# Patient Record
Sex: Female | Born: 1965 | Race: White | Hispanic: No | Smoking: Never smoker
Health system: Southern US, Community
[De-identification: ages and names within clinical notes are randomized; demographics above are authoritative.]

## PROBLEM LIST (undated history)

## (undated) DIAGNOSIS — Z8719 Personal history of other diseases of the digestive system: Secondary | ICD-10-CM

## (undated) DIAGNOSIS — M069 Rheumatoid arthritis, unspecified: Secondary | ICD-10-CM

## (undated) DIAGNOSIS — E119 Type 2 diabetes mellitus without complications: Secondary | ICD-10-CM

## (undated) DIAGNOSIS — I10 Essential (primary) hypertension: Secondary | ICD-10-CM

## (undated) DIAGNOSIS — G43909 Migraine, unspecified, not intractable, without status migrainosus: Secondary | ICD-10-CM

## (undated) DIAGNOSIS — G629 Polyneuropathy, unspecified: Secondary | ICD-10-CM

## (undated) DIAGNOSIS — Z973 Presence of spectacles and contact lenses: Secondary | ICD-10-CM

## (undated) DIAGNOSIS — E039 Hypothyroidism, unspecified: Secondary | ICD-10-CM

## (undated) DIAGNOSIS — K219 Gastro-esophageal reflux disease without esophagitis: Secondary | ICD-10-CM

## (undated) DIAGNOSIS — F419 Anxiety disorder, unspecified: Secondary | ICD-10-CM

## (undated) DIAGNOSIS — G8929 Other chronic pain: Secondary | ICD-10-CM

## (undated) DIAGNOSIS — M79672 Pain in left foot: Secondary | ICD-10-CM

## (undated) DIAGNOSIS — M1990 Unspecified osteoarthritis, unspecified site: Secondary | ICD-10-CM

## (undated) DIAGNOSIS — E785 Hyperlipidemia, unspecified: Secondary | ICD-10-CM

## (undated) DIAGNOSIS — N393 Stress incontinence (female) (male): Secondary | ICD-10-CM

## (undated) DIAGNOSIS — Z794 Long term (current) use of insulin: Secondary | ICD-10-CM

## (undated) HISTORY — DX: Anxiety disorder, unspecified: F41.9

## (undated) HISTORY — DX: Unspecified osteoarthritis, unspecified site: M19.90

## (undated) HISTORY — PX: BUNIONECTOMY: SHX129

## (undated) HISTORY — PX: FOOT SURGERY: SHX648

## (undated) HISTORY — PX: TONSILLECTOMY: SUR1361

## (undated) HISTORY — PX: TRANSOBTURATOR SLING: SHX2571

## (undated) HISTORY — DX: Hyperlipidemia, unspecified: E78.5

## (undated) HISTORY — PX: CARPAL TUNNEL RELEASE: SHX101

## (undated) HISTORY — PX: ANAL SPHINCTEROTOMY: SHX1140

---

## 1988-11-14 HISTORY — PX: ABDOMINAL HYSTERECTOMY: SHX81

## 1999-02-09 ENCOUNTER — Ambulatory Visit (HOSPITAL_COMMUNITY): Admission: RE | Admit: 1999-02-09 | Discharge: 1999-02-09 | Payer: Self-pay | Admitting: Family Medicine

## 1999-09-19 ENCOUNTER — Emergency Department (HOSPITAL_COMMUNITY): Admission: EM | Admit: 1999-09-19 | Discharge: 1999-09-19 | Payer: Self-pay | Admitting: Emergency Medicine

## 1999-09-19 ENCOUNTER — Encounter: Payer: Self-pay | Admitting: Emergency Medicine

## 1999-09-27 ENCOUNTER — Encounter: Admission: RE | Admit: 1999-09-27 | Discharge: 1999-10-22 | Payer: Self-pay | Admitting: Sports Medicine

## 1999-12-12 ENCOUNTER — Ambulatory Visit (HOSPITAL_COMMUNITY): Admission: RE | Admit: 1999-12-12 | Discharge: 1999-12-12 | Payer: Self-pay | Admitting: Family Medicine

## 1999-12-12 ENCOUNTER — Encounter: Payer: Self-pay | Admitting: Family Medicine

## 2000-01-02 ENCOUNTER — Ambulatory Visit (HOSPITAL_COMMUNITY): Admission: RE | Admit: 2000-01-02 | Discharge: 2000-01-02 | Payer: Self-pay | Admitting: Unknown Physician Specialty

## 2000-01-02 ENCOUNTER — Encounter: Payer: Self-pay | Admitting: Family Medicine

## 2000-05-24 ENCOUNTER — Ambulatory Visit (HOSPITAL_COMMUNITY): Admission: RE | Admit: 2000-05-24 | Discharge: 2000-05-24 | Payer: Self-pay | Admitting: Obstetrics and Gynecology

## 2000-05-24 ENCOUNTER — Encounter: Payer: Self-pay | Admitting: Obstetrics and Gynecology

## 2000-07-07 ENCOUNTER — Ambulatory Visit (HOSPITAL_COMMUNITY): Admission: RE | Admit: 2000-07-07 | Discharge: 2000-07-07 | Payer: Self-pay | Admitting: Gastroenterology

## 2000-07-07 ENCOUNTER — Encounter (INDEPENDENT_AMBULATORY_CARE_PROVIDER_SITE_OTHER): Payer: Self-pay | Admitting: Specialist

## 2000-10-14 ENCOUNTER — Encounter: Payer: Self-pay | Admitting: Family Medicine

## 2000-10-14 ENCOUNTER — Ambulatory Visit (HOSPITAL_COMMUNITY): Admission: RE | Admit: 2000-10-14 | Discharge: 2000-10-14 | Payer: Self-pay | Admitting: Family Medicine

## 2000-11-03 ENCOUNTER — Encounter: Payer: Self-pay | Admitting: Gastroenterology

## 2000-11-03 ENCOUNTER — Ambulatory Visit (HOSPITAL_COMMUNITY): Admission: RE | Admit: 2000-11-03 | Discharge: 2000-11-03 | Payer: Self-pay | Admitting: Gastroenterology

## 2000-11-15 ENCOUNTER — Ambulatory Visit (HOSPITAL_COMMUNITY): Admission: RE | Admit: 2000-11-15 | Discharge: 2000-11-15 | Payer: Self-pay | Admitting: Family Medicine

## 2000-11-15 ENCOUNTER — Encounter: Payer: Self-pay | Admitting: Family Medicine

## 2001-02-27 ENCOUNTER — Ambulatory Visit (HOSPITAL_COMMUNITY): Admission: RE | Admit: 2001-02-27 | Discharge: 2001-02-27 | Payer: Self-pay | Admitting: Obstetrics and Gynecology

## 2001-02-27 ENCOUNTER — Encounter: Payer: Self-pay | Admitting: Obstetrics and Gynecology

## 2001-03-08 ENCOUNTER — Encounter: Admission: RE | Admit: 2001-03-08 | Discharge: 2001-04-19 | Payer: Self-pay | Admitting: Family Medicine

## 2001-07-31 ENCOUNTER — Encounter: Payer: Self-pay | Admitting: Obstetrics and Gynecology

## 2001-07-31 ENCOUNTER — Encounter: Admission: RE | Admit: 2001-07-31 | Discharge: 2001-07-31 | Payer: Self-pay | Admitting: Obstetrics and Gynecology

## 2003-05-20 ENCOUNTER — Encounter: Payer: Self-pay | Admitting: *Deleted

## 2003-05-20 ENCOUNTER — Ambulatory Visit (HOSPITAL_COMMUNITY): Admission: RE | Admit: 2003-05-20 | Discharge: 2003-05-20 | Payer: Self-pay | Admitting: *Deleted

## 2003-07-25 ENCOUNTER — Ambulatory Visit (HOSPITAL_COMMUNITY): Admission: RE | Admit: 2003-07-25 | Discharge: 2003-07-25 | Payer: Self-pay | Admitting: Gastroenterology

## 2004-01-08 ENCOUNTER — Ambulatory Visit (HOSPITAL_BASED_OUTPATIENT_CLINIC_OR_DEPARTMENT_OTHER): Admission: RE | Admit: 2004-01-08 | Discharge: 2004-01-08 | Payer: Self-pay | Admitting: Urology

## 2004-09-17 ENCOUNTER — Encounter: Admission: RE | Admit: 2004-09-17 | Discharge: 2004-09-17 | Payer: Self-pay | Admitting: Obstetrics and Gynecology

## 2005-03-14 ENCOUNTER — Ambulatory Visit (HOSPITAL_COMMUNITY): Admission: RE | Admit: 2005-03-14 | Discharge: 2005-03-14 | Payer: Self-pay | Admitting: Family Medicine

## 2005-10-10 ENCOUNTER — Encounter: Admission: RE | Admit: 2005-10-10 | Discharge: 2005-10-25 | Payer: Self-pay | Admitting: Family Medicine

## 2005-11-28 ENCOUNTER — Encounter: Admission: RE | Admit: 2005-11-28 | Discharge: 2006-02-26 | Payer: Self-pay | Admitting: Family Medicine

## 2006-01-05 ENCOUNTER — Ambulatory Visit (HOSPITAL_BASED_OUTPATIENT_CLINIC_OR_DEPARTMENT_OTHER): Admission: RE | Admit: 2006-01-05 | Discharge: 2006-01-05 | Payer: Self-pay | Admitting: General Surgery

## 2006-08-30 ENCOUNTER — Encounter (INDEPENDENT_AMBULATORY_CARE_PROVIDER_SITE_OTHER): Payer: Self-pay | Admitting: *Deleted

## 2006-08-30 ENCOUNTER — Other Ambulatory Visit: Admission: RE | Admit: 2006-08-30 | Discharge: 2006-08-30 | Payer: Self-pay | Admitting: Dental General Practice

## 2006-11-24 ENCOUNTER — Ambulatory Visit (HOSPITAL_COMMUNITY): Admission: RE | Admit: 2006-11-24 | Discharge: 2006-11-24 | Payer: Self-pay | Admitting: Family Medicine

## 2007-10-17 ENCOUNTER — Ambulatory Visit (HOSPITAL_COMMUNITY): Admission: RE | Admit: 2007-10-17 | Discharge: 2007-10-17 | Payer: Self-pay | Admitting: Obstetrics and Gynecology

## 2008-04-22 ENCOUNTER — Encounter: Admission: RE | Admit: 2008-04-22 | Discharge: 2008-04-22 | Payer: Self-pay

## 2008-08-18 ENCOUNTER — Encounter: Admission: RE | Admit: 2008-08-18 | Discharge: 2008-11-16 | Payer: Self-pay | Admitting: Family Medicine

## 2008-09-15 ENCOUNTER — Ambulatory Visit (HOSPITAL_COMMUNITY): Admission: RE | Admit: 2008-09-15 | Discharge: 2008-09-15 | Payer: Self-pay | Admitting: Gastroenterology

## 2008-10-24 ENCOUNTER — Ambulatory Visit (HOSPITAL_COMMUNITY): Admission: RE | Admit: 2008-10-24 | Discharge: 2008-10-24 | Payer: Self-pay | Admitting: Obstetrics and Gynecology

## 2009-02-24 ENCOUNTER — Ambulatory Visit: Payer: Self-pay | Admitting: Family Medicine

## 2009-04-30 ENCOUNTER — Ambulatory Visit (HOSPITAL_COMMUNITY): Admission: RE | Admit: 2009-04-30 | Discharge: 2009-04-30 | Payer: Self-pay | Admitting: Podiatry

## 2009-06-24 ENCOUNTER — Encounter: Admission: RE | Admit: 2009-06-24 | Discharge: 2009-08-18 | Payer: Self-pay | Admitting: Podiatry

## 2009-09-25 ENCOUNTER — Ambulatory Visit (HOSPITAL_BASED_OUTPATIENT_CLINIC_OR_DEPARTMENT_OTHER): Admission: RE | Admit: 2009-09-25 | Discharge: 2009-09-25 | Payer: Self-pay | Admitting: Podiatry

## 2010-01-11 ENCOUNTER — Ambulatory Visit (HOSPITAL_COMMUNITY): Admission: RE | Admit: 2010-01-11 | Discharge: 2010-01-11 | Payer: Self-pay | Admitting: Family Medicine

## 2010-02-01 ENCOUNTER — Encounter: Admission: RE | Admit: 2010-02-01 | Discharge: 2010-05-02 | Payer: Self-pay | Admitting: Family Medicine

## 2010-05-10 ENCOUNTER — Ambulatory Visit (HOSPITAL_COMMUNITY): Admission: RE | Admit: 2010-05-10 | Discharge: 2010-05-10 | Payer: Self-pay | Admitting: Sports Medicine

## 2010-07-27 ENCOUNTER — Ambulatory Visit: Payer: Self-pay | Admitting: Family Medicine

## 2010-08-06 ENCOUNTER — Ambulatory Visit (HOSPITAL_COMMUNITY): Admission: RE | Admit: 2010-08-06 | Discharge: 2010-08-06 | Payer: Self-pay | Admitting: Sports Medicine

## 2010-08-19 ENCOUNTER — Ambulatory Visit (HOSPITAL_COMMUNITY): Admission: RE | Admit: 2010-08-19 | Discharge: 2010-08-19 | Payer: Self-pay | Admitting: Internal Medicine

## 2010-09-03 ENCOUNTER — Ambulatory Visit (HOSPITAL_COMMUNITY): Admission: RE | Admit: 2010-09-03 | Discharge: 2010-09-03 | Payer: Self-pay | Admitting: Endocrinology

## 2010-09-07 ENCOUNTER — Ambulatory Visit (HOSPITAL_COMMUNITY): Admission: RE | Admit: 2010-09-07 | Discharge: 2010-09-07 | Payer: Self-pay | Admitting: Obstetrics and Gynecology

## 2010-09-17 ENCOUNTER — Ambulatory Visit (HOSPITAL_COMMUNITY): Admission: RE | Admit: 2010-09-17 | Discharge: 2010-09-17 | Payer: Self-pay | Admitting: Endocrinology

## 2010-12-01 ENCOUNTER — Ambulatory Visit: Admission: RE | Admit: 2010-12-01 | Discharge: 2010-12-01 | Payer: Self-pay | Source: Home / Self Care

## 2010-12-01 DIAGNOSIS — E785 Hyperlipidemia, unspecified: Secondary | ICD-10-CM | POA: Insufficient documentation

## 2010-12-01 DIAGNOSIS — Z794 Long term (current) use of insulin: Secondary | ICD-10-CM | POA: Insufficient documentation

## 2010-12-01 DIAGNOSIS — E039 Hypothyroidism, unspecified: Secondary | ICD-10-CM | POA: Insufficient documentation

## 2010-12-01 DIAGNOSIS — E669 Obesity, unspecified: Secondary | ICD-10-CM | POA: Insufficient documentation

## 2010-12-01 DIAGNOSIS — I10 Essential (primary) hypertension: Secondary | ICD-10-CM | POA: Insufficient documentation

## 2010-12-01 DIAGNOSIS — M069 Rheumatoid arthritis, unspecified: Secondary | ICD-10-CM | POA: Insufficient documentation

## 2010-12-01 DIAGNOSIS — E1149 Type 2 diabetes mellitus with other diabetic neurological complication: Secondary | ICD-10-CM | POA: Insufficient documentation

## 2010-12-01 DIAGNOSIS — E1142 Type 2 diabetes mellitus with diabetic polyneuropathy: Secondary | ICD-10-CM | POA: Insufficient documentation

## 2010-12-04 ENCOUNTER — Encounter: Payer: Self-pay | Admitting: Endocrinology

## 2010-12-10 ENCOUNTER — Encounter: Payer: Self-pay | Admitting: Family Medicine

## 2010-12-16 NOTE — Assessment & Plan Note (Signed)
Summary: TO SEE SYKES/L2W/EO   Vital Signs:  Patient profile:   45 year old female Height:      69 inches Weight:      250.3 pounds BMI:     37.10  Vitals Entered By: Wyona Almas PHD (December 01, 2010 1:19 PM)  History of Present Illness: Assessment:  Spent 60 min w/ pt.  [place] was referred by Crisoforo Oxford to Wellness.   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  Usual eating pattern includes 3 meals and 3 snacks daily.  Everyday foods/beverages include [employer] Fit [employer] Active flavored water, sausage or bacon sandwich for breakfast.  Tylena's husband is working in [place] now, and she finds it difficult to cook for just herself.  Usual exercise routine includes   ~20  min of kettle bell wts (squats and dead lift) a few X wk.  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  24-hr recall suggests intake of  ~1800 kcal: B (8 AM)- bacon (2.5 slc) on 2 slc white wheat w/ 1 slc Amer chs, flvrd water; L (PM)- 4 oz deli roast beef & 1 Amer chs sandw w/ 1/2 tsp mayo on whwht, 12 Doritos, sugar-free canned peaches; D (PM)- 6 oz tilapia, coleslaw w/ 1 tbsp mayo, 15 swt potato fries, 20 oz 1% milk.   Raeanne has been checking BG levels only when she feels bad, but plans to start getting consistent.  Takeout or rest food is  ~2 X wk.    Nutrition Diagnosis:  Physical inactivity (NB-2.1) related to neuropathies in feet and logistical constraints as evidenced by no exercise other than kettle bell workouts at home.  Inappropriate intake of food fats (NI-51.3) related to meats as evidenced by frequent intake of sausage or bacon.  Inappropriate intake of types of carbohydrate (NI-53.3) related to veg's as evidenced by no veg's consumed yesterday other than 1 svng of coleslaw.    Intervention: See Patient Instructions.    Monitoring/Eval:  Dietary intake, body weight, and exercise at 1-mo  F/U.     Other Orders: Inital Assessment Each - [facility] [phone])  Patient Instructions: 1)  CHECK YOUR FASTING BLOOD SUGARS EVERY DAY, AND RECORD ON YOUR LOG.   2)  You may want to see Asher Muir at [facility] re. remedies for diabetic neuropathies (i.e., black currant seed or evening primrose oil).   3)  Explore scholarships at [employer]:  [phone].  Meanwhile, stand at work at least every 30 minutes, and look for exercise opportunities.  4)  Reminder:  Saturated (and trans) fats make your insulin work less well.  (Limit fried foods, fast foods, and animal fats).   5)  TASTE PREFERENCES ARE LEARNED.   6)  A better breakfast than your usual bacon/sausage sandwich:  LEAN meat (< or equal to 2.5 g per oz) or 2-hard-boiled egg sandwich.  Add a piece of fresh fruit if this is not enough.   7)  Obtain twice as many veg's as protein or carbohydrate foods for both lunch and dinner.  (Try Newman's Own Raspberry Walnut Vinaigrette.) 8)  Veg's:  Keep baby carrots, cabbage, & frozen veg's on hand.  Try stir-frying any leafy greens.  (Use as little oil as possible, then add water).   9)  Continue your 3 meals & 3 snacks per day.  (Snacks:  ALWAYS portion an amount out in a  bowl/plate, and put away the container.)   Orders Added: 1)  Inital Assessment Each - [facility] [56213]

## 2010-12-22 NOTE — Miscellaneous (Signed)
Summary: Fasting Blood Glucose Readings  Clinical Lists Changes Britta Mccreedy emailed her fasting (7-8 AM) glucoses: 1-19: 160 1-21: 147 1-22: 170 1-23: 175 1-24: 203 (no night-time insulin) 1-25: 144 1-26: 183 1-27: 152 "I crash during the day 79-115." Pt reports not having yet been back to the gym.

## 2011-02-16 LAB — BASIC METABOLIC PANEL
Chloride: 98 mEq/L (ref 96–112)
Glucose, Bld: 184 mg/dL — ABNORMAL HIGH (ref 70–99)
Sodium: 133 mEq/L — ABNORMAL LOW (ref 135–145)

## 2011-02-16 LAB — GLUCOSE, CAPILLARY
Glucose-Capillary: 131 mg/dL — ABNORMAL HIGH (ref 70–99)
Glucose-Capillary: 135 mg/dL — ABNORMAL HIGH (ref 70–99)

## 2011-03-03 ENCOUNTER — Other Ambulatory Visit (HOSPITAL_COMMUNITY): Payer: Self-pay | Admitting: Endocrinology

## 2011-03-03 DIAGNOSIS — E049 Nontoxic goiter, unspecified: Secondary | ICD-10-CM

## 2011-03-08 ENCOUNTER — Ambulatory Visit (HOSPITAL_COMMUNITY)
Admission: RE | Admit: 2011-03-08 | Discharge: 2011-03-08 | Disposition: A | Discharge status: Home / Self Care | Payer: 59 | Source: Ambulatory Visit | Attending: Endocrinology | Admitting: Endocrinology

## 2011-03-08 DIAGNOSIS — Z79899 Other long term (current) drug therapy: Secondary | ICD-10-CM | POA: Insufficient documentation

## 2011-03-08 DIAGNOSIS — E039 Hypothyroidism, unspecified: Secondary | ICD-10-CM | POA: Insufficient documentation

## 2011-03-08 DIAGNOSIS — E049 Nontoxic goiter, unspecified: Secondary | ICD-10-CM

## 2011-03-08 DIAGNOSIS — E042 Nontoxic multinodular goiter: Secondary | ICD-10-CM | POA: Insufficient documentation

## 2011-03-29 NOTE — Op Note (Signed)
Amy Adams, Amy Adams              ACCOUNT NO.:  [id number]   MEDICAL RECORD NO.:  [id number]          PATIENT TYPE:  AMB   LOCATION:  ENDO                         FACILITY:  [facility]   PHYSICIAN:  Petra Kuba, M.D.    DATE OF BIRTH:  04-25-1966   DATE OF PROCEDURE:  09/15/2008  DATE OF DISCHARGE:                               OPERATIVE REPORT   PROCEDURE:  Colonoscopy.   INDICATION:  The patient with a history of colon polyps, due for repeat  screening.  Consent was signed after risks, benefits, methods, and  options thoroughly discussed multiple times in the past.   MEDICINES USED:  1. Fentanyl 150 mcg.  2. Versed 12 mg.   PROCEDURE:  Rectal inspection is pertinent for small external  hemorrhoids.  Digital exam was negative.  Video colonoscope was inserted  and with abdominal pressure fairly easily able to be advanced around the  colon to the cecum.  No abnormality was seen on insertion.  Cecum was  identified by the appendiceal orifice in the ileocecal valve.  In fact,  the scope was inserted a short way into the terminal ileum, which was  normal.  Further augmentation was obtained.  The scope was  slowly withdrawn.  Prep was adequate.  There was some liquid stool that  required washing and suctioning on slow withdrawal through the colon.  No polyps, tumors, masses, diverticula, or other abnormalities were seen  as we slowly withdrew back to the rectum.  Once back in the rectum,  anorectal pull-through and retroflexion confirmed some small  hemorrhoids.  Scope was drained and readvanced towards the left side.  The colon air was suctioned and scope removed.  The patient tolerated  the procedure well.  There was no obvious immediate complication.   ENDOSCOPIC DIAGNOSES:  1. Internal and external small hemorrhoids.  2. Otherwise, within normal limits to the terminal ilium.   PLAN:  Happy to see back p.r.n.  Return the care to Dr. Tiburcio Pea.  Consider changing her diabetic  medicines since that may play a role with  her occasional loose stools, and I will be happy to see back p.r.n.,  otherwise recheck chart.  Confirm if she had adenomatous polyps before.  Recheck colon screening in 5 years.           ______________________________  Petra Kuba, M.D.     MEM/MEDQ  D:  09/15/2008  T:  09/16/2008  Job:  [id number]   cc:   Holley Bouche, M.D.

## 2011-04-01 NOTE — Op Note (Signed)
NAMEARDYCE, HEYER              ACCOUNT NO.:  [id number]   MEDICAL RECORD NO.:  [id number]          PATIENT TYPE:  AMB   LOCATION:  [facility]                         FACILITY:  [facility]   PHYSICIAN:  Anselm Pancoast. Weatherly, M.D.DATE OF BIRTH:  06-03-1966   DATE OF PROCEDURE:  01/05/2006  DATE OF DISCHARGE:                                 OPERATIVE REPORT   PREOPERATIVE DIAGNOSIS:  Chronic recurrent posterior anal fissure.   POSTOPERATIVE DIAGNOSIS:  Chronic recurrent posterior anal fissure.   OPERATION:  Examination under anesthesia and internal sphincterotomy  posterior.   ANESTHESIA:  General anesthesia.   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 Orson Slick back  in November. At that time, she had a lot of anal spasm, had a posterior  fissure, was treated with pain medication __________ and stool softeners.  Was seen by Dr. Marcille Blanco approximately 2 weeks later at which time she was  still having spasm but appeared approximately 1 month later still having  spasm but thought that she was improving. She then was seen approximately 3  weeks later by Dr. Orson Slick and at that time it was noted that the fissure was  healed and there was spasm or roughness of the Anoderm. Then I saw her  approximately 6 weeks later at which time she was having marked spasm, had  obviously a posterior fissure and I recommended that we proceed on an  internal sphincterotomy and examination under anesthesia. She has had  colonoscopies within the last year or two by Dr. Dorena Cookey and Dr. Ewing Schlein  and there was no other abnormalities noted. The patient's in agreement with  this and desired that the surgery be performed at [facility] since she  works at [employer].   The patient preoperatively after being identified and etc. was given 3 grams  of  Unasyn and taken back to the OR table. She was induced with general  anesthesia and LOA tube and then placed in yellow fin stirrups and I got her  legs up very high exposing the anus. In a relaxed position you can now see a  large posterior fissure with the sphincter visible. The fissure is large  enough posterior that I think it would best to go ahead and do the internal.  Sphincterotomy posterior and then kind of partially close the mucosa and  anoderm up above instead of trying to do a left lateral sphincterotomy. I  used a hemostat to elevate the internal sphincter from the external  sphincter and then used the cautery to divide it. There was reasonably good  hemostasis. I cut the little skin posterior just a little further so that if  there would be any little cavity that it would not kind of catch as this  area is healing and then used two figure-of-eight of 2-0 chromic to kind of  close the more proximal portion of the posterior anoderm mucosa  area. She  has got mild hemorrhoids but I think these are all related to the spasm and  etc. and I think it would be best just to follow her and see if she doesn't  get basically resolution of these after the internal sphincterotomy. I then  __________ it appears that the sphincterotomy is complete and then I put  about 10 mL of 0.5% Marcaine adrenaline  in both the left and right after reprepping this area with the Betadine  solution. The patient tolerated the procedure nicely. A little gauze 4 x 4  was placed in the area and then the ABD and stretch panties. The patient  will be released after a short stay in the recovery room.           ______________________________  Anselm Pancoast. Zachery Dakins, M.D.     WJW/MEDQ  D:  01/05/2006  T:  01/06/2006  Job:  [id number]

## 2011-04-01 NOTE — Op Note (Signed)
[facility]  Patient:    MARIETTE, COWLEY                     MRN: [id number] Proc. Date: 07/07/00 Adm. Date:  [id number] Disc. Date: [id number] Attending:  Deneen Harts CC:         Arvella Merles, M.D.  Almetta Lovely   Operative Report  PROCEDURE:  Colonoscopy with biopsy.  INDICATIONS:  Patient with bright red blood per rectum, abnormal mass felt per gynecologist.  Consent was signed after risks, benefits, methods, and options were thoroughly discussed in the office.  MEDICATIONS USED:  Demerol 100, Versed 8.  DESCRIPTION OF PROCEDURE:  Rectal inspection was performed for external hemorrhoids.  Digital exam was negative.  The video colonoscope was inserted and fairly easily advanced around the colon to the cecum.  This did require rolling her on her back and some abdominal pressure.  The cecum was identified by the appendiceal orifice and the ileocecal valve; in fact, the scope was inserted a short way into the terminal ileum which was normal.  Photo documentation was obtained.  The scope was slowly withdrawn.  The prep was fairly adequate.  She did have some stool adherent to the walls which required lots of washing and suctioning and slow withdrawal through the colon.  The cecum, ascending, transverse were normal.  In the mid descending, a small questionable, tiny 1-2 mm polyp was seen and was cold biopsied x 2.  The scope was further withdrawn back to the rectum.  No additional findings were seen. Once back in the rectum, the scope was retroflexed for some internal hemorrhoids.  The scope was straightened.  Anal-rectal pullthrough confirmed the above.  The scope was reinserted up the left side of the colon.  Air was suctioned.  The scope was removed.  The patient tolerated the procedure well. There was no obvious immediate complications.  ENDOSCOPIC DIAGNOSES: 1. Internal-external hemorrhoids. 2. Questionable tiny, mid descending  polyp cold biopsied. 3. Otherwise within normal limits to the terminal ileum.  PLAN:  Continue workups per Dr. Tiburcio Pea and Dr. Gilford Silvius which, if concerned about a mass, would probably do a CT scan next.  I will be happy to see back p.r.n.; otherwise, see back in six weeks to recheck guaiac symptoms and make sure no further workup in the plans from my standpoint is needed and will wait on pathology to see when repeat colonic screening is needed.  If this were adenomatous, would do again in five years; however, if not, would probably wait until age 11 unless family history or other symptoms arise. DD:  07/07/00 TD:  07/09/00 Job: [id number]

## 2011-04-01 NOTE — Op Note (Signed)
   NAME:  Amy Adams, Amy Adams                        ACCOUNT NO.:  [id number]   MEDICAL RECORD NO.:  [id number]                   PATIENT TYPE:  AMB   LOCATION:  ENDO                                 FACILITY:  [facility]   PHYSICIAN:  Petra Kuba, M.D.                 DATE OF BIRTH:  08/12/1966   DATE OF PROCEDURE:  07/25/2003  DATE OF DISCHARGE:                                 OPERATIVE REPORT   PROCEDURE:  Colonoscopy.   INDICATIONS:  The patient has a history of colon polyps.  Due for repeat  screening.  Consent was signed after risks, benefits, methods, and options  were thoroughly discussed in the office in the past.   PREMEDICATIONS:  Fentanyl  100 mcg, Versed 9 mg.   DESCRIPTION OF PROCEDURE:  Rectal inspection pertinent for small external  hemorrhoids.  Digital exam was negative.  Video colonoscope was inserted and  fairly easily advanced around the colon to the cecum.  This did require some  abdominal pressure but no position changes.  The cecum was identified by the  appendiceal orifice and the ileocecal valve.  No obvious abnormalities were  seen on insertion.  The scope was slowly withdrawn.  The prep on the right  side was fair with stool adherent to the wall which could not all be washed  or suctioned off.  The rest of the prep was fairly adequate although lots of  washing and suctioning was done on slow withdrawal through the colon.  No  polyps, tumors, masses, diverticula or other abnormalities were seen.  Anorectal pullthrough and retroflexion confirmed small hemorrhoids.  The  scope was reinserted a short ways up the left side of the colon.  Air was  suctioned and the scope removed.  The patient tolerated the procedure well.  There were no obvious immediate complications.   ENDOSCOPIC DIAGNOSES:  1. Internal and external hemorrhoids.  2. Otherwise within normal limits to the cecum.   PLAN:  Continue Analpram with 2.5% h.c. for her hemorrhoids.  Happy to see  back  p.r.n.  Otherwise return care to Dr. Tiburcio Pea for the customary health  care maintenance to include yearly rectals and guaiacs and would recheck  colon screening in five years unless needed sooner p.r.n.                                                 Petra Kuba, M.D.    MEM/MEDQ  D:  07/25/2003  T:  07/25/2003  Job:  [id number]

## 2011-04-01 NOTE — Op Note (Signed)
NAME:  Amy Adams, Amy Adams                        ACCOUNT NO.:  [id number]   MEDICAL RECORD NO.:  [id number]                   PATIENT TYPE:  AMB   LOCATION:  [facility]                                 FACILITY[facility]  [facility]   PHYSICIAN:  Excell Seltzer. Annabell Howells, M.D.                 DATE OF BIRTH:  03-20-1966   DATE OF PROCEDURE:  01/08/2004  DATE OF DISCHARGE:                                 OPERATIVE REPORT   PREOPERATIVE DIAGNOSIS:  Stress urinary incontinence.   POSTOPERATIVE DIAGNOSIS:  Stress urinary incontinence.   PROCEDURE:  Transobturator sling.   SURGEON:  Excell Seltzer. Annabell Howells, M.D.   ANESTHESIA:  General.   COMPLICATIONS:  None.   INDICATIONS:  Amy Adams is a 45 year old white female with a history of  both vaginal birth and cesarean section, who has significant stress  incontinence that is precluding activities.  She has elected to undergo a  transobturator sling.   FINDINGS AND PROCEDURE:  The patient was given p.o. Cipro.  She was taken to  the operating room, where a general anesthetic was induced.  She was placed  in the lithotomy position.  Her perineum and genitalia were prepped with  Betadine solution and she was draped in the usual sterile fashion.  A 16  French Foley catheter was inserted, the bladder was drained.  Two small  incisions were made 5 cm lateral to the clitoris at the outer edge of the  labia, one on each side, with a knife.  The weighted vaginal retractor was  placed.  The anterior vaginal wall was infiltrated with 8 mL 1% lidocaine  with epinephrine and a 2 cm incision was made over the mid urethral level.  The mucosa was elevated off the pubourethral fascia on each side for  approximately 2 cm to allow placement of a finger.  The C-shaped passing  needle was then placed through the right lateral puncture wound.  The tip  was brought down to the bone, worked to the lateral edge, and then rotated  beneath the bone out into the vaginal incision under finger guidance.   On  the initial attempt the tip of the needle punctured the vaginal wall lateral  to the incision.  It was repositioned without difficulty.  The sling  material using the T sling system from __________ [employer] was secured to the  passing needle and then drawn back into the perilabial incision.  This  procedure was then repeated on the left side without the misdirection of the  needle that occurred initially on the right side.  The sling material was  passed.  Cystoscopy was then performed.  No evidence of bladder or urethral  injury was noted.  The bladder was left full.  At this point the sling  material was then tensioned.  The sleeves were removed.  Pressure on the  bladder still produced a small amount of urine flow as desired.  The  Foley  catheter was reinserted and the bladder was drained.  The anterior vaginal  wall was then closed using a running locked 2-0 Vicryl stitch.  The small  puncture wound in the lateral aspect of the vaginal wall was also closed  with a figure-of-eight 2-0 Vicryl stitch.  At this point the redundant sling  material was trimmed at the skin level, allowing it to drop back into the  subcutaneous space on each side of the  labia.  The small incisions were then closed with Dermabond.  A two-inch  iodoform vaginal pack was placed.  The Foley catheter was connected to  straight drainage.  The patient was taken down from lithotomy position.  Her  anesthetic was reversed.  She was moved to the recovery room in stable  condition.  There were no complications.                                               Excell Seltzer. Annabell Howells, M.D.    JJW/MEDQ  D:  01/08/2004  T:  01/08/2004  Job:  [id number]   cc:   Melida Quitter, M.D.  [address]  [place]  [place] [address]  Fax: [phone]

## 2011-09-12 ENCOUNTER — Other Ambulatory Visit (HOSPITAL_COMMUNITY): Payer: Self-pay | Admitting: Obstetrics and Gynecology

## 2011-09-12 DIAGNOSIS — Z1231 Encounter for screening mammogram for malignant neoplasm of breast: Secondary | ICD-10-CM

## 2011-10-10 ENCOUNTER — Ambulatory Visit (HOSPITAL_COMMUNITY): Payer: Commercial Managed Care - PPO

## 2011-11-10 ENCOUNTER — Ambulatory Visit (HOSPITAL_COMMUNITY)
Admission: RE | Admit: 2011-11-10 | Discharge: 2011-11-10 | Disposition: A | Discharge status: Home / Self Care | Payer: 59 | Source: Ambulatory Visit | Attending: Obstetrics and Gynecology | Admitting: Obstetrics and Gynecology

## 2011-11-10 DIAGNOSIS — Z1231 Encounter for screening mammogram for malignant neoplasm of breast: Secondary | ICD-10-CM

## 2012-03-07 ENCOUNTER — Other Ambulatory Visit (HOSPITAL_COMMUNITY): Payer: Self-pay | Admitting: Allergy

## 2012-03-07 DIAGNOSIS — J329 Chronic sinusitis, unspecified: Secondary | ICD-10-CM

## 2012-03-08 ENCOUNTER — Other Ambulatory Visit (HOSPITAL_COMMUNITY): Payer: Self-pay | Admitting: Allergy

## 2012-03-08 ENCOUNTER — Ambulatory Visit (HOSPITAL_COMMUNITY)
Admission: RE | Admit: 2012-03-08 | Discharge: 2012-03-08 | Disposition: A | Discharge status: Home / Self Care | Payer: 59 | Source: Ambulatory Visit | Attending: Allergy | Admitting: Allergy

## 2012-03-08 DIAGNOSIS — J329 Chronic sinusitis, unspecified: Secondary | ICD-10-CM | POA: Insufficient documentation

## 2012-03-08 DIAGNOSIS — R059 Cough, unspecified: Secondary | ICD-10-CM | POA: Insufficient documentation

## 2012-03-08 DIAGNOSIS — R05 Cough: Secondary | ICD-10-CM | POA: Insufficient documentation

## 2012-07-18 ENCOUNTER — Other Ambulatory Visit (HOSPITAL_COMMUNITY): Payer: Self-pay | Admitting: Endocrinology

## 2012-07-18 DIAGNOSIS — E049 Nontoxic goiter, unspecified: Secondary | ICD-10-CM

## 2012-07-26 ENCOUNTER — Other Ambulatory Visit (HOSPITAL_COMMUNITY): Payer: 59

## 2012-07-27 ENCOUNTER — Other Ambulatory Visit (HOSPITAL_COMMUNITY): Payer: 59

## 2012-08-07 ENCOUNTER — Ambulatory Visit (HOSPITAL_COMMUNITY)
Admission: RE | Admit: 2012-08-07 | Discharge: 2012-08-07 | Disposition: A | Discharge status: Home / Self Care | Payer: 59 | Source: Ambulatory Visit | Attending: Endocrinology | Admitting: Endocrinology

## 2012-08-07 DIAGNOSIS — E042 Nontoxic multinodular goiter: Secondary | ICD-10-CM | POA: Insufficient documentation

## 2012-08-07 DIAGNOSIS — E049 Nontoxic goiter, unspecified: Secondary | ICD-10-CM

## 2012-08-09 ENCOUNTER — Other Ambulatory Visit: Payer: Self-pay | Admitting: Gastroenterology

## 2012-10-15 ENCOUNTER — Other Ambulatory Visit (HOSPITAL_COMMUNITY): Payer: Self-pay | Admitting: Obstetrics and Gynecology

## 2012-10-15 DIAGNOSIS — Z1231 Encounter for screening mammogram for malignant neoplasm of breast: Secondary | ICD-10-CM

## 2012-11-05 ENCOUNTER — Other Ambulatory Visit (HOSPITAL_COMMUNITY): Payer: Self-pay | Admitting: Internal Medicine

## 2012-11-05 DIAGNOSIS — R29898 Other symptoms and signs involving the musculoskeletal system: Secondary | ICD-10-CM

## 2012-11-05 DIAGNOSIS — R200 Anesthesia of skin: Secondary | ICD-10-CM

## 2012-11-08 ENCOUNTER — Ambulatory Visit (HOSPITAL_COMMUNITY): Payer: 59

## 2012-11-09 ENCOUNTER — Ambulatory Visit (HOSPITAL_COMMUNITY)
Admission: RE | Admit: 2012-11-09 | Discharge: 2012-11-09 | Disposition: A | Discharge status: Home / Self Care | Payer: 59 | Source: Ambulatory Visit | Attending: Internal Medicine | Admitting: Internal Medicine

## 2012-11-09 DIAGNOSIS — R209 Unspecified disturbances of skin sensation: Secondary | ICD-10-CM | POA: Insufficient documentation

## 2012-11-09 DIAGNOSIS — R200 Anesthesia of skin: Secondary | ICD-10-CM

## 2012-11-09 DIAGNOSIS — R29898 Other symptoms and signs involving the musculoskeletal system: Secondary | ICD-10-CM | POA: Insufficient documentation

## 2012-11-12 ENCOUNTER — Ambulatory Visit (HOSPITAL_COMMUNITY)
Admission: RE | Admit: 2012-11-12 | Discharge: 2012-11-12 | Disposition: A | Discharge status: Home / Self Care | Payer: 59 | Source: Ambulatory Visit | Attending: Obstetrics and Gynecology | Admitting: Obstetrics and Gynecology

## 2012-11-12 DIAGNOSIS — Z1231 Encounter for screening mammogram for malignant neoplasm of breast: Secondary | ICD-10-CM | POA: Insufficient documentation

## 2013-01-01 ENCOUNTER — Ambulatory Visit (INDEPENDENT_AMBULATORY_CARE_PROVIDER_SITE_OTHER): Payer: Self-pay | Admitting: Family Medicine

## 2013-01-01 DIAGNOSIS — E119 Type 2 diabetes mellitus without complications: Secondary | ICD-10-CM

## 2013-01-01 NOTE — Progress Notes (Signed)
Patient presents to pharmacy for 3 month follow up of DM as part of the employee sponsored [employer] to [employer]. Medications, insulin regimen, and glucose readings have been reviewed. I have also discussed with patient lifestyle interventions such as diet and exercise. Full documentation of this visit can be found in the [employer] documenting system through the [employer] [facility]). Patient has set a series of personal goals and will f/u in 3 months for further review of DM.

## 2013-01-10 ENCOUNTER — Other Ambulatory Visit (HOSPITAL_COMMUNITY): Payer: Self-pay | Admitting: Endocrinology

## 2013-01-10 DIAGNOSIS — E049 Nontoxic goiter, unspecified: Secondary | ICD-10-CM

## 2013-01-14 NOTE — Progress Notes (Signed)
ATTENDING PHYSICIAN NOTE: I have reviewed the chart and agree with the plan as detailed above. Sara Neal MD Pager [phone]  

## 2013-04-05 ENCOUNTER — Ambulatory Visit (INDEPENDENT_AMBULATORY_CARE_PROVIDER_SITE_OTHER): Payer: Self-pay | Admitting: Family Medicine

## 2013-04-05 DIAGNOSIS — E119 Type 2 diabetes mellitus without complications: Secondary | ICD-10-CM

## 2013-04-05 NOTE — Progress Notes (Signed)
Patient presents to pharmacy for 3 month follow up DM as part of the employee sponsored [employer] to [employer]. Medications, glucose readings, and insulin regimen has been reviewed. I have also discussed with patient lifestyle interventions such as diet and exercise. Full documentation of this visit can be found in the [employer] documenting system through [employer] [facility]). However, specifically we did specifically discuss the rule of 15 for hypoglycemia as well as proper administration time for humalog (15 minutes prior to meals instead of an hour). Patient has set a series of personal goals and will follow up in 3 months for further review of DM.

## 2013-04-11 NOTE — Progress Notes (Signed)
Patient ID: Amy Adams, female   DOB: February 14, 1966, 47 y.o.   MRN: [id number] ATTENDING PHYSICIAN NOTE: I have reviewed the chart and agree with the plan as detailed above. Denny Levy MD Pager [phone]

## 2013-06-17 ENCOUNTER — Ambulatory Visit (HOSPITAL_COMMUNITY)
Admission: RE | Admit: 2013-06-17 | Discharge: 2013-06-17 | Disposition: A | Discharge status: Home / Self Care | Payer: 59 | Source: Ambulatory Visit | Attending: Endocrinology | Admitting: Endocrinology

## 2013-06-17 DIAGNOSIS — Z79899 Other long term (current) drug therapy: Secondary | ICD-10-CM | POA: Insufficient documentation

## 2013-06-17 DIAGNOSIS — E049 Nontoxic goiter, unspecified: Secondary | ICD-10-CM | POA: Insufficient documentation

## 2013-07-11 ENCOUNTER — Ambulatory Visit (INDEPENDENT_AMBULATORY_CARE_PROVIDER_SITE_OTHER): Payer: 59 | Admitting: Family Medicine

## 2013-07-11 VITALS — BP 143/91 | Wt 276.0 lb

## 2013-07-11 DIAGNOSIS — E119 Type 2 diabetes mellitus without complications: Secondary | ICD-10-CM

## 2013-07-11 NOTE — Progress Notes (Signed)
Patient presents for 3 month f/u DM as part of the employee sponsored Link to [employer]. Medications, glucose readings, and insulin regimen have been reviewed. I have also discussed with patient lifestyle interventions such as diet and exercise. Full documentation of this visit can be found in the [employer] documenting system through [employer] [facility]). However specific areas of concern include the following:  POC A1C 6. This is the best I've seen it. Patient tested 39 x in 30 days. 21/39 were hyperglycemic so I actually expected a higher A1C. She said her MD's A1C result was 6.1 which reflects mine. There were no blood sugars <70 on her meter and lowest was 111. The only thing I can think of is she started methotrexate back in december which can affect RBC's which would in turn affect A1C. I'm going to get her to send me a copy of labs again and make sure she's not anemic. plan 1.) A1C at goal, continue current regimen for now but will obtain copies of labs because blood sugars and A1C just don't match up. I can't draw a fructosamine here, but will investigate further and then maybe ask Dr. Talmage Nap to draw.  2.) patient had to stop exercising again due to her foot. 3.) goal 12 lb wt loss by next visit  Hypertension: BP high but had missed one of her BP medicines today. Will monitor.  Will f/u to get labs but will physically see patient in 3 months for further review.

## 2013-09-10 ENCOUNTER — Ambulatory Visit (INDEPENDENT_AMBULATORY_CARE_PROVIDER_SITE_OTHER): Payer: 59 | Admitting: Podiatry

## 2013-09-10 ENCOUNTER — Ambulatory Visit (INDEPENDENT_AMBULATORY_CARE_PROVIDER_SITE_OTHER): Payer: 59

## 2013-09-10 ENCOUNTER — Encounter: Payer: Self-pay | Admitting: Podiatry

## 2013-09-10 VITALS — BP 158/102 | HR 88 | Resp 16

## 2013-09-10 DIAGNOSIS — M79672 Pain in left foot: Secondary | ICD-10-CM

## 2013-09-10 DIAGNOSIS — M7752 Other enthesopathy of left foot: Secondary | ICD-10-CM

## 2013-09-10 DIAGNOSIS — M79609 Pain in unspecified limb: Secondary | ICD-10-CM

## 2013-09-10 DIAGNOSIS — M2040 Other hammer toe(s) (acquired), unspecified foot: Secondary | ICD-10-CM

## 2013-09-10 DIAGNOSIS — M7750 Other enthesopathy of unspecified foot: Secondary | ICD-10-CM

## 2013-09-10 MED ORDER — GABAPENTIN 400 MG PO CAPS: 400.0000 mg | ORAL_CAPSULE | Freq: Three times a day (TID) | ORAL | Status: DC

## 2013-09-10 NOTE — Progress Notes (Signed)
Nedra presents today stating that her left foot. I'll swatting down and felt a pop. She says now it hurts right in here and she points to the dorsal aspect of the second metatarsophalangeal joint left and the plantar arch. States it hurts when she walks.  Objective: I have reviewed her past medical history medications and allergies. Vital signs are stable she is alert and oriented x3. She has medial deviation of the second toe left foot with some dorsal dislocation at the second metatarsophalangeal joint. She has pain on palpation and in range of motion of this joint. Radiographic evaluation does demonstrate elongated second metatarsal medial deviation of the toe.  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 More than likely a ruptured plantar plate.  Plan: We discussed etiology pathology conservative versus surgical therapies. She'll start wearing her Darco shoe at home. I injected para-articular about the second metatarsophalangeal joint left. 20 mg of Kenalog was used. Followup with her in about 3 weeks which time we'll discuss surgical intervention.

## 2013-09-10 NOTE — Patient Instructions (Signed)
Wear darco shoe and follow up in three weeks for surgery consult

## 2013-09-11 NOTE — Progress Notes (Signed)
Patient ID: Amy Adams, female   DOB: 01/27/1966, 47 y.o.   MRN: [id number] ATTENDING PHYSICIAN NOTE: I have reviewed the chart and agree with the plan as detailed above. Sara Neal MD Pager [phone]  

## 2013-09-17 ENCOUNTER — Ambulatory Visit (INDEPENDENT_AMBULATORY_CARE_PROVIDER_SITE_OTHER): Payer: 59 | Admitting: Podiatry

## 2013-09-17 ENCOUNTER — Encounter: Payer: Self-pay | Admitting: Podiatry

## 2013-09-17 VITALS — BP 161/100 | HR 83 | Resp 16

## 2013-09-17 DIAGNOSIS — M7750 Other enthesopathy of unspecified foot: Secondary | ICD-10-CM

## 2013-09-17 DIAGNOSIS — M722 Plantar fascial fibromatosis: Secondary | ICD-10-CM

## 2013-09-17 DIAGNOSIS — L600 Ingrowing nail: Secondary | ICD-10-CM

## 2013-09-17 DIAGNOSIS — M2040 Other hammer toe(s) (acquired), unspecified foot: Secondary | ICD-10-CM

## 2013-09-17 NOTE — Progress Notes (Signed)
Amy Adams presents today for surgical consult regarding bilateral foot. I reviewed her past medical history medications and allergies. Pulses remain palpable to the bilateral lower extremity is. Secondary to failure of conservative therapies to the second metatarsophalangeal joint surgical consideration is the current option. We can send her today for a second metatarsal osteotomy with screw left foot hammertoe repair with screw second digit left foot and endoscopic plantar fasciotomy left foot. Tibial border matrixectomy right foot. We discussed this in great detail today I answered all the questions regarding these procedures to the best of ability in layman's terms. She understands that is amenable to it and signed Dr. pages of the consent form. I will followup with her in the near future for surgery. Issues dispensed a Cam Walker.

## 2013-09-25 ENCOUNTER — Other Ambulatory Visit (HOSPITAL_COMMUNITY): Payer: Self-pay | Admitting: Endocrinology

## 2013-09-25 DIAGNOSIS — E049 Nontoxic goiter, unspecified: Secondary | ICD-10-CM

## 2013-10-01 ENCOUNTER — Ambulatory Visit: Payer: 59 | Admitting: Podiatry

## 2013-10-02 ENCOUNTER — Telehealth: Payer: Self-pay | Admitting: *Deleted

## 2013-10-02 NOTE — Telephone Encounter (Addendum)
Pt states injured her foot, even though she has neuropathy she can feel it in her toe, heel.  Pt request pain medication.  Dr Al Corpus orders Tramadol 50mg  #30 one tid.  I informed pt of orders and to pick up in the [place] office.  Pt states has been taking Tramadol at bedtime and it is not helping.  I told pt to take as directed and if problems continue to make an appt.

## 2013-10-03 MED ORDER — TRAMADOL HCL 50 MG PO TABS: 50.0000 mg | ORAL_TABLET | Freq: Three times a day (TID) | ORAL | Status: DC | PRN

## 2013-10-17 DIAGNOSIS — M722 Plantar fascial fibromatosis: Secondary | ICD-10-CM

## 2013-10-18 ENCOUNTER — Encounter: Payer: Self-pay | Admitting: Podiatry

## 2013-10-18 DIAGNOSIS — M2040 Other hammer toe(s) (acquired), unspecified foot: Secondary | ICD-10-CM

## 2013-10-18 DIAGNOSIS — L600 Ingrowing nail: Secondary | ICD-10-CM

## 2013-10-18 DIAGNOSIS — M21549 Acquired clubfoot, unspecified foot: Secondary | ICD-10-CM

## 2013-10-18 DIAGNOSIS — M722 Plantar fascial fibromatosis: Secondary | ICD-10-CM

## 2013-10-24 ENCOUNTER — Encounter: Payer: Self-pay | Admitting: Podiatry

## 2013-10-24 ENCOUNTER — Ambulatory Visit (INDEPENDENT_AMBULATORY_CARE_PROVIDER_SITE_OTHER): Payer: 59

## 2013-10-24 ENCOUNTER — Ambulatory Visit (INDEPENDENT_AMBULATORY_CARE_PROVIDER_SITE_OTHER): Payer: 59 | Admitting: Podiatry

## 2013-10-24 VITALS — BP 157/90 | HR 75 | Resp 16 | Ht 69.0 in | Wt 270.0 lb

## 2013-10-24 DIAGNOSIS — Z9889 Other specified postprocedural states: Secondary | ICD-10-CM

## 2013-10-24 NOTE — Progress Notes (Signed)
Amy Adams presents today one week status post surgical matrixectomy hallux right endoscopic plantar fasciotomy left heel as well as a second metatarsal osteotomy and hammertoe repair left she states that the forefoot is a little tender otherwise everything else seems to be doing quite well.  Objective: Vital signs are stable she is alert and oriented x3. Dry sterile dressings were intact once removed demonstrates no erythema mild edema no saline is drainage or odor. Left foot radiographs does demonstrate well-healing surgical foot capital fragment in good position as well as screws to capital fragment second metatarsal as well as the toe with good approximation of the second PIPJ. It appears to be healing quite nicely cutaneous of a was demonstrates supple hydrated cutis no erythema does mild edema saline is drainage or odor.  Assessment: Well-healing surgical foot status post 1 week second met osteotomy and hammertoe repair left with an endoscopic plantar fasciotomy left. Surgical matricectomy hallux right.  Plan: Redressed today dry sterile compressive dressing will followup with her in one week. Continues to help this as much as possible keep it dry and elevated continue the use ice as much as possible.

## 2013-10-31 ENCOUNTER — Encounter: Payer: Self-pay | Admitting: Podiatry

## 2013-10-31 ENCOUNTER — Ambulatory Visit (INDEPENDENT_AMBULATORY_CARE_PROVIDER_SITE_OTHER): Payer: 59 | Admitting: Podiatry

## 2013-10-31 VITALS — BP 172/92 | HR 78 | Resp 12

## 2013-10-31 DIAGNOSIS — Z9889 Other specified postprocedural states: Secondary | ICD-10-CM

## 2013-10-31 NOTE — Progress Notes (Signed)
   Subjective:    Patient ID: Amy Adams, female    DOB: [birth year], 47 y.o.   MRN: [id number]  HPI Comments: '' LT FOOT IS SWOLLEN AND RT GREAT TOE CANT  FEEL IT.''     Review of Systems     Objective:   Physical Exam: She presents today best of dressing intact both feet. She's status post second metatarsal osteotomy with screw left hammertoe repair second left with screw as well as an endoscopic plantar fasciotomy left foot. Right foot is simply a matrixectomy surgical in nature hallux right. Much decrease in edema no erythema saline is drainage or odor. Sutures are removed margins appear be well coapted at this point we will try to get her into a Darco shoe and allow her to start soaking her hallux right.        Assessment & Plan:  Assessment: Well-healing surgical feet.  Plan: Compression anklet Darco shoe allow her to start washing her feet and applying lotion. She will soak both feet Epsom salts warm water at least once a day. I will followup with her in 2-4 weeks for another set of x-rays

## 2013-11-19 ENCOUNTER — Ambulatory Visit (INDEPENDENT_AMBULATORY_CARE_PROVIDER_SITE_OTHER): Payer: 59

## 2013-11-19 ENCOUNTER — Encounter: Payer: Self-pay | Admitting: Podiatry

## 2013-11-19 ENCOUNTER — Ambulatory Visit: Payer: 59 | Admitting: Podiatry

## 2013-11-19 VITALS — BP 173/97 | HR 98 | Resp 16

## 2013-11-19 DIAGNOSIS — Z9889 Other specified postprocedural states: Secondary | ICD-10-CM

## 2013-11-19 MED ORDER — TRAMADOL HCL 50 MG PO TABS: 50.0000 mg | ORAL_TABLET | Freq: Three times a day (TID) | ORAL | Status: DC | PRN

## 2013-11-19 NOTE — Progress Notes (Signed)
Amy Adams presents today for followup of her matrixectomy hallux right and a second metatarsal osteotomy with hammertoe repair second left. She denies fever chills nausea vomiting muscle aches and pains. She presents today with her Cam Walker on and surgical dressing.  Objective: Vital signs are stable she is alert and oriented x3. There is no erythema edema cellulitis drainage or odor.  Assessment: Well-healing surgical foot bilateral.  Plan: New prescription for tramadol. Darco digital splint to toe left. Continue to soak bilateral foot in Epsom salts warm water. Start massage therapy for scars and range of motion exercises for the second metatarsophalangeal joint. I will followup with her one month before releasing her to work. X-rays we performed left foot next visit

## 2013-11-25 NOTE — Progress Notes (Signed)
1) Metatarsal osteotomy 2nd met left foot  2) Endoscopic plantar fasciotomy left foot 3) Excision permanent nail 1st toe right foot 4) Hammer toe repair 2nd toe left foot

## 2013-12-17 ENCOUNTER — Encounter: Payer: Self-pay | Admitting: Podiatry

## 2013-12-17 ENCOUNTER — Ambulatory Visit (INDEPENDENT_AMBULATORY_CARE_PROVIDER_SITE_OTHER): Payer: 59

## 2013-12-17 ENCOUNTER — Ambulatory Visit (INDEPENDENT_AMBULATORY_CARE_PROVIDER_SITE_OTHER): Payer: 59 | Admitting: Podiatry

## 2013-12-17 VITALS — BP 165/98 | HR 89 | Resp 16

## 2013-12-17 DIAGNOSIS — Z9889 Other specified postprocedural states: Secondary | ICD-10-CM

## 2013-12-17 NOTE — Progress Notes (Signed)
Post op 12.5.14 left foot sec met ost , hammertoe repair , its doing good , it looks good . She denies fever chills nausea vomiting muscle aches or pains she states that she's able to walk on it. She was no she's ready to go back to work or not.  Objective: Vital signs are stable she is alert and oriented x3. She is status post second metatarsal osteotomy with hammertoe repair and screw second left. There is minimal edema no erythema saline is drainage or odor. The toe sets rectus with weightbearing and radiographs do demonstrate well-healing surgical foot.  Assessment: Well-healing surgical foot left.  Plan: I would allow her to go back to work with limited walking no followup with her in one month for another set of x-rays.

## 2013-12-24 ENCOUNTER — Telehealth: Payer: Self-pay | Admitting: *Deleted

## 2013-12-24 NOTE — Telephone Encounter (Signed)
Pt states the right 1st toenail is splitting along the side Dr Milinda Pointer removed the hangnail.  I asked the pt if there was bleeding, drainage or swelling.  Pt denies the signs of infection in the area.  I offered an appt or she could cover with a bandaid until the nail grows out.  Pt states she will wait and call if worsens.

## 2013-12-26 ENCOUNTER — Other Ambulatory Visit (HOSPITAL_COMMUNITY): Payer: Self-pay | Admitting: Endocrinology

## 2013-12-26 DIAGNOSIS — E049 Nontoxic goiter, unspecified: Secondary | ICD-10-CM

## 2013-12-27 ENCOUNTER — Ambulatory Visit (INDEPENDENT_AMBULATORY_CARE_PROVIDER_SITE_OTHER): Payer: Self-pay | Admitting: Family Medicine

## 2013-12-27 VITALS — BP 159/89 | HR 95 | Wt 275.0 lb

## 2013-12-27 DIAGNOSIS — E119 Type 2 diabetes mellitus without complications: Secondary | ICD-10-CM

## 2013-12-27 NOTE — Progress Notes (Signed)
Patient presents for 3 mo f/u DM as part of the employee sponsored [employer] to [employer]. Medications, glucose readings, and insulin regimen have been reviewed. I have also discussed with patient lifestyle interventions such as diet and exercise. Full documentation of this visit can be found in the [employer] documenting system through [facility]). However specifics from this visit include the following.   Diabetes Mellitus:POC A1C 7.5, not at goal. Patient lost her job and started taking half or cutting out completely some of her medications just in case she lost her insurance and wouldn't be able to get them. She got another job with [employer] at the end of December but did not resume taking her medications as prescribed. One of the medications she stopped was metformin. plan 1.) Start back metformin. Start taking 1 tab x 1 week then increase to 2 tabs. continue increasing weekly by 1 tab until reach 4 tabs per day.  2.) Start checking blood sugar again. Need to check 3-4x per day particulary fasting and then 2 h PPG.  3.) f/u 1 mo for meter check but will call patient next week to make sure she is staying back on track.  Hyperlipidemia: recently had FLP done but had them at [facility] and didn't bring me results. She said they were not good and her lipitor was increased from 20 to 40 mg. Told patient to bring me the results. Her fenofibrate was past due so I filled that today (this may also have been one of the medications she wasn't taking).  Hypertension:  Hasn't taken HCTZ at all and just started back her amlodipine 2 days ago. BP was elevated today at 159/89. Will restart her HCTZ. Provided patient with BP monitor so we know if that brings it down and will f/u in 1 via phone for BP check.  Cost Savings Intervention Outcomes: Medical problem identified; Supplies provided, proactive intervention   Patient has set a series of personal goals and will f/u in 1 mo for further review of  DM

## 2014-01-14 ENCOUNTER — Encounter: Payer: Self-pay | Admitting: Podiatry

## 2014-01-14 ENCOUNTER — Ambulatory Visit (INDEPENDENT_AMBULATORY_CARE_PROVIDER_SITE_OTHER): Payer: 59

## 2014-01-14 ENCOUNTER — Ambulatory Visit: Payer: 59 | Admitting: Podiatry

## 2014-01-14 VITALS — BP 161/95 | HR 100 | Resp 16

## 2014-01-14 DIAGNOSIS — Z9889 Other specified postprocedural states: Secondary | ICD-10-CM

## 2014-01-14 NOTE — Progress Notes (Signed)
Dos 12.5.14 , left foot doing good, it hurts on top of second met, right great toenail has split want him to check that. She denies fever chills nausea vomiting muscle aches and pains states that she's doing pretty well at her new job as far as getting around.  Objective: Vital signs are stable she is alert and oriented x3. Pulses are palpable. Some medial deviation of the second toe but all in all it looks much better than it did previously it is in good alignment and does not dorsiflex. Radiographs confirm well-healed second metatarsal osteotomy and hammertoe repair second left.  Plan: Discussed etiology pathology conservative versus surgical therapies. I will followup with her in 3 months. She's to get back to her regular routine.

## 2014-02-12 ENCOUNTER — Encounter: Payer: Self-pay | Admitting: *Deleted

## 2014-02-12 ENCOUNTER — Encounter: Payer: 59 | Attending: Endocrinology | Admitting: *Deleted

## 2014-02-12 VITALS — Ht 69.0 in | Wt 271.7 lb

## 2014-02-12 DIAGNOSIS — E1149 Type 2 diabetes mellitus with other diabetic neurological complication: Secondary | ICD-10-CM

## 2014-02-12 DIAGNOSIS — E669 Obesity, unspecified: Secondary | ICD-10-CM

## 2014-02-12 DIAGNOSIS — Z713 Dietary counseling and surveillance: Secondary | ICD-10-CM | POA: Insufficient documentation

## 2014-02-12 DIAGNOSIS — E119 Type 2 diabetes mellitus without complications: Secondary | ICD-10-CM | POA: Insufficient documentation

## 2014-02-12 NOTE — Progress Notes (Signed)
Appt start time: 0730 end time:  0900.  Assessment:  Patient was seen on  02/12/14 for individual diabetes education. Lives with husband, she shops for the food and he cooks the meals. She states he fries a lot of foods. He travels with work for several weeks to months at a time. SMBG twice a day with reported range of 78 - 300. She states she is symptomatic of low BG at 90 mg/dl. No activity right now with recent foot surgery but she is cleared to go back to the gym starting next week.   Current HbA1c: 8.1% in March, 2015  Preferred Learning Style: Hands on  Learning Readiness:   Ready  Change in progress  MEDICATIONS: see list, diabetes medications are Humalog, Lantus and Metformin  DIETARY INTAKE:  24-hr recall:  B ( AM): fast food biscuit with egg, OR bologna sandwich Fit and Active drink (flavored water) or diet soda  Snk ( AM): no, she forgets and then will probably go low by lunch  L ( PM): left overs from home, varies, protein, starch, vegetables, diet soda or water Snk ( PM): pretzels OR fresh fruit OR Nutrigrain bar OR small candy D ( PM): meat, vegetable and starch,  Snk ( PM): more likely to eat more; chips, ice cream sandwich Beverages: water, Fit and Active flavored water, 2% milk  Usual physical activity: none right now  Estimated energy needs: 1600 calories 180 g carbohydrates 120 g protein 44 g fat  Progress Towards Goal(s):  In progress.   Nutritional Diagnosis:  NI-1.5 Excessive energy intake As related to activity level.  As evidenced by BMI of 40.2    Intervention:  Nutrition counseling provided.  Discussed diabetes disease process and treatment options.  Discussed physiology of diabetes and role of obesity on insulin resistance.  Encouraged moderate weight reduction to improve glucose levels.  Discussed role of medications and diet in glucose control  Provided education on macronutrients on glucose levels.  Provided education on carb counting,  importance of regularly scheduled meals/snacks, and meal planning  Discussed effects of physical activity on glucose levels and long-term glucose control.  Recommended goal of 150 minutes of physical activity/week but to start in 10 minute increments.  Plan to review patient medications at next visit.  Will then discuss role of insulin action on blood glucose and possible side effects  Discussed blood glucose monitoring and interpretation.  Discussed recommended target ranges and individual ranges.    Described short-term complications: hyper- and hypo-glycemia.  Discussed causes,symptoms, and treatment options.  Discussed prevention, detection, and treatment of long-term complications.  Discussed the role of prolonged elevated glucose levels on body systems.  Discussed role of stress on blood glucose levels and discussed strategies to manage psychosocial issues.  Discussed recommendations for long-term diabetes self-care.  Provided checklist for medical, dental, and emotional self-care.  Plan:  Aim for 3 Carb Choices per meal (45 grams) +/- 1 either way  Aim for 0-2 Carbs per snack if hungry  Include protein in moderation with your meals and snacks Consider reading food labels for Total Carbohydrate of foods Use 15 grams pure carbohydrate without added fat to treat low BGs Consider  increasing your activity level by riding bike for 5-10 minutes a couple of times daily as tolerated Consider checking BG at alternate times per day as directed by MD   Teaching Method Utilized: Visual, Auditory and Hands on  Handouts given during visit include: Living Well with Diabetes Carb Counting and Food Label  handouts Meal Plan Card  Barriers to learning/adherence to lifestyle change: recent foot surgery has limited any activity  Diabetes self-care support plan:   [facility] support group  Co-workers and friends   Demonstrated degree of understanding via:  Teach Back   Monitoring/Evaluation:   Dietary intake, exercise, reading food labels, and body weight in 1 month(s). She also plans to attend [employer] Classes on Mar 23, 2014

## 2014-02-12 NOTE — Patient Instructions (Signed)
Plan:  Aim for 3 Carb Choices per meal (45 grams) +/- 1 either way  Aim for 0-2 Carbs per snack if hungry  Include protein in moderation with your meals and snacks Consider reading food labels for Total Carbohydrate of foods Use 15 grams pure carbohydrate without added fat to treat low BGs Consider  increasing your activity level by riding bike for 5-10 minutes a couple of times daily as tolerated Consider checking BG at alternate times per day as directed by MD

## 2014-02-27 ENCOUNTER — Encounter: Payer: 59 | Admitting: *Deleted

## 2014-02-27 VITALS — Ht 69.0 in | Wt 271.7 lb

## 2014-02-27 DIAGNOSIS — E1149 Type 2 diabetes mellitus with other diabetic neurological complication: Secondary | ICD-10-CM

## 2014-02-27 DIAGNOSIS — E119 Type 2 diabetes mellitus without complications: Secondary | ICD-10-CM

## 2014-02-27 NOTE — Patient Instructions (Addendum)
Plan:  Continue to aim for 3 Carb Choices per meal (45 grams) +/- 1 either way  Continue to aim for 0-2 Carbs per snack if hungry  Continue Include protein in moderation with your meals and snacks Continue reading food labels for Total Carbohydrate of foods Use 15 grams pure carbohydrate without added fat to treat low BGs Continue increasing your activity level by riding bike for 5-10 minutes a couple of times daily as tolerated Continue checking BG at alternate times per day as directed by MD

## 2014-02-27 NOTE — Progress Notes (Signed)
Appt start time: 0730 end time:  0830.  Assessment:  Patient was seen on  02/27/14 for individual diabetes education follow up. She reports her BGs are much better with range of 70 to a highest BG reading of 130 mg/dl! She states she has been cooking instead of her husband who usually fries the foods. She is baking the meats and she is using more frozen and fresh vegetables with only one starch type food per meal. She has been bringing a boiled egg with fresh fruit instead of a heavy breakfast. She is eating the mid morning snack which helps prevent low BG before her 1 PM lunch and a snack in the aftenoon.  She does have a problem with her late night at work and having to eat after 7 PM when she gets home.  Current HbA1c: 8.1% in March, 2015  Preferred Learning Style: Hands on  Learning Readiness:   Ready  Change in progress  MEDICATIONS: see list, diabetes medications are Humalog, Lantus and Metformin  DIETARY INTAKE:  24-hr recall:  B ( AM): fast food biscuit with egg, OR bologna sandwich Fit and Active drink (flavored water) or diet soda  Snk ( AM): no, she forgets and then will probably go low by lunch  L ( PM): left overs from home, varies, protein, starch, vegetables, diet soda or water Snk ( PM): pretzels OR fresh fruit OR Nutrigrain bar OR small candy D ( PM): meat, vegetable and starch,  Snk ( PM): more likely to eat more; chips, ice cream sandwich Beverages: water, Fit and Active flavored water, 2% milk  Usual physical activity: none right now due to recent foot surgery  Estimated energy needs: 1600 calories 180 g carbohydrates 120 g protein 44 g fat  Progress Towards Goal(s):  In progress.   Nutritional Diagnosis:  NI-1.5 Excessive energy intake As related to activity level.  As evidenced by BMI of 40.2    Intervention:  Nutrition counseling provided.  Commended her on her improved eating habits and improved BG's!  Reviewed patient medications including both  types of insulin and Metformint.  Discussed role of insulin action on blood glucose and possible side effects. Also introduced her to Intensive Insulin opportunity and the ability to adjust rapid acting insulin based on carb intake and also a correction dose scale. She is interested in pursuing that if Dr. Chalmers Cater is OK with it also.  Plan:  Continue to aim for 3 Carb Choices per meal (45 grams) +/- 1 either way  Continue to aim for 0-2 Carbs per snack if hungry  Continue Include protein in moderation with your meals and snacks Continue reading food labels for Total Carbohydrate of foods Use 15 grams pure carbohydrate without added fat to treat low BGs Continue increasing your activity level by riding bike for 5-10 minutes a couple of times daily as tolerated Continue checking BG at alternate times per day as directed by MD     Teaching Method Utilized: Visual, Auditory and Hands on  Handouts given during visit include: Insulin Action handout Intensive Insulin handout  Barriers to learning/adherence to lifestyle change: recent foot surgery has limited any activity  Diabetes self-care support plan:   [facility] support group  Co-workers and friends   Demonstrated degree of understanding via:  Teach Back   Monitoring/Evaluation:  Dietary intake, exercise, reading food labels, and body weight in 1 month(s). She also plans to attend [employer] Classes on Mar 23, 2014

## 2014-03-04 ENCOUNTER — Encounter: Payer: Self-pay | Admitting: Podiatry

## 2014-03-04 ENCOUNTER — Ambulatory Visit (INDEPENDENT_AMBULATORY_CARE_PROVIDER_SITE_OTHER): Payer: 59 | Admitting: Podiatry

## 2014-03-04 ENCOUNTER — Ambulatory Visit (INDEPENDENT_AMBULATORY_CARE_PROVIDER_SITE_OTHER): Payer: 59

## 2014-03-04 VITALS — BP 151/95 | HR 100 | Resp 16

## 2014-03-04 DIAGNOSIS — M779 Enthesopathy, unspecified: Secondary | ICD-10-CM

## 2014-03-04 DIAGNOSIS — R609 Edema, unspecified: Secondary | ICD-10-CM

## 2014-03-04 DIAGNOSIS — M7750 Other enthesopathy of unspecified foot: Secondary | ICD-10-CM

## 2014-03-04 DIAGNOSIS — R600 Localized edema: Secondary | ICD-10-CM

## 2014-03-04 DIAGNOSIS — M778 Other enthesopathies, not elsewhere classified: Secondary | ICD-10-CM

## 2014-03-04 NOTE — Progress Notes (Signed)
She presents today with a chief complaint of pain around the surgical second metatarsophalangeal joint of the left foot. She states that she really doesn't know what she did but she obviously hurt the foot while she was sleeping. States that her tennis shoes or new she has not been walking barefoot she has not been wearing sandals.  Objective: Vital signs are stable she is alert and oriented x3. Radiographic evaluation does not demonstrate any type of osseous abnormalities, around the surgical site screw to the second metatarsal and second toe are intact there is good alignment of the joint there does appear to be some soft tissue swelling present. She has tenderness on range of motion of the second metatarsophalangeal joint of the left foot.  Assessment capsulitis status post second metatarsal osteotomy hammertoe repair second left.  Plan: I suggested that she get back into her Darco shoe and I injected dexamethasone to the lateral aspect of the second metatarsophalangeal joint.

## 2014-03-06 NOTE — Progress Notes (Signed)
Patient ID: Amy Adams, female   DOB: Apr 22, 1966, 48 y.o.   MRN: [id number] ATTENDING PHYSICIAN NOTE: I have reviewed the chart and agree with the plan as detailed above. Dorcas Mcmurray MD Pager [phone]

## 2014-03-06 NOTE — Progress Notes (Signed)
We weighed the patient on Tanita scale, here are the results:  TANITA  BODY COMP RESULTS 02/12/14  Weight 269.0 lb   BMI (kg/m^2) 39.7   Fat Mass (lbs) 142.0 lb   Fat Free Mass (lbs) 127.0 lb   Total Body Water (lbs) 93.0 lb   TANITA  BODY COMP RESULTS 02/27/14  Weight 274.0 lb   BMI (kg/m^2) 40.5   Fat Mass (lbs) 142.5 lb   Fat Free Mass (lbs) 131.5 lb   Total Body Water (lbs) 96.5 lb   Total weight up 5 pounds on the Tanita scale but that includes increase in Fat Free mass of 4.5 pounds which also includes increase in water weight of 3.5 pounds. Very slight increase in Fat Mass of 0.5 pounds. Will continue to follow and encourage further behavior changes and BG management as method of measuring successes.

## 2014-03-12 ENCOUNTER — Encounter: Payer: 59 | Admitting: *Deleted

## 2014-03-12 VITALS — Ht 69.0 in | Wt 272.0 lb

## 2014-03-12 DIAGNOSIS — E1149 Type 2 diabetes mellitus with other diabetic neurological complication: Secondary | ICD-10-CM

## 2014-03-12 DIAGNOSIS — E669 Obesity, unspecified: Secondary | ICD-10-CM

## 2014-03-12 NOTE — Progress Notes (Signed)
Appt start time: 0830 end time:  0845.  Assessment:  Patient was seen on  03/12/14 for individual diabetes education follow up. She reports she is having more low BGs especially if she doesn't have time for a snack between meal.  As a result she is having to eat more foods to treat the lows.  She is not recording her BG's so there is not a record of frequency or degree of hypoglycemia. We will weigh on Tanita Scale again today.  Current HbA1c: 8.1% in March, 2015  Preferred Learning Style: Hands on  Learning Readiness:   Ready  Change in progress  MEDICATIONS: see list, diabetes medications are Humalog, Lantus and Metformin  DIETARY INTAKE:  24-hr recall:  B ( AM): fast food biscuit with egg, OR bologna sandwich Fit and Active drink (flavored water) or diet soda  Snk ( AM): pkg of crackers around 11 AM L ( PM): left overs from home, varies, protein, starch, vegetables, diet soda or water Snk ( PM): pretzels OR fresh fruit OR Nutrigrain bar OR small candy D ( PM): meat, vegetable and starch,  Snk ( PM): more likely to eat more; chips, ice cream sandwich Beverages: water, Fit and Active flavored water, 2% milk  Usual physical activity: none right now due to recent foot surgery and recent injury to this foot.   TANITA  BODY COMP RESULTS 02/12/14  Weight 269.0 lb   BMI (kg/m^2) 39.7   Fat Mass (lbs) 142.0 lb   Fat Free Mass (lbs) 127.0 lb   Total Body Water (lbs) 93.0 lb   TANITA  BODY COMP RESULTS 02/27/14  Weight 274.0 lb   BMI (kg/m^2) 40.5   Fat Mass (lbs) 142.5 lb   Fat Free Mass (lbs) 131.5 lb   Total Body Water (lbs) 96.5 lb   TANITA  BODY COMP RESULTS 03/12/14  Weight 272.5 lb   BMI (kg/m^2) 40.2   Fat Mass (lbs) 144.0 lb   Fat Free Mass (lbs) 128.5 lb   Total Body Water (lbs) 94.0 lb    Estimated energy needs: 1600 calories 180 g carbohydrates 120 g protein 44 g fat  Progress Towards Goal(s):  In progress.   Nutritional Diagnosis:  NI-1.5 Excessive energy  intake As related to activity level.  As evidenced by BMI of 40.2.    Intervention:  Nutrition counseling provided.  Reviewed Tanita Scale results which indicate total weight loss of 1.5 pounds but most of that appears to be a lower water weight.  Requested she start recording BG info on Log Sheet so we can assess causes and make changes   Plan:  Continue to aim for 3 Carb Choices per meal (45 grams) +/- 1 either way  Continue to aim for 0-2 Carbs per snack if hungry  Continue Include protein in moderation with your meals and snacks Continue reading food labels for Total Carbohydrate of foods Use 15 grams pure carbohydrate without added fat to treat low BGs Continue increasing your activity level by riding bike for 5-10 minutes a couple of times daily as tolerated Continue checking BG at alternate times per day as directed by MD     Teaching Method Utilized: Visual, Auditory and Hands on  Handouts given during visit include: Log Sheet for BG documentation  Barriers to learning/adherence to lifestyle change: recent foot surgery has limited any activity  Diabetes self-care support plan:   [facility] support group  Co-workers and friends   Demonstrated degree of understanding via:  Teach Back  Monitoring/Evaluation:  Dietary intake, exercise, reading food labels, and body weight in 1 month(s). She also plans to attend Core Classes on Mar 23, 2014

## 2014-03-12 NOTE — Patient Instructions (Signed)
Plan:  Continue to aim for 3 Carb Choices per meal (45 grams) +/- 1 either way  Continue to aim for 0-2 Carbs per snack if hungry  Continue Include protein in moderation with your meals and snacks Continue reading food labels for Total Carbohydrate of foods Use 15 grams pure carbohydrate without added fat to treat low BGs Continue increasing your activity level by riding stationary bike for 5-10 minutes a couple of times daily as tolerated Continue checking BG at alternate times per day as directed by MD  Consider recording BG and Carb info on Log Sheet I gave you today as well as any hypoglycemia readings so we can assess what we need to change to prevent them going forward.

## 2014-03-15 ENCOUNTER — Ambulatory Visit: Payer: 59

## 2014-03-22 ENCOUNTER — Encounter: Payer: 59 | Attending: Endocrinology

## 2014-03-22 VITALS — Ht 69.0 in | Wt 272.0 lb

## 2014-03-22 DIAGNOSIS — Z713 Dietary counseling and surveillance: Secondary | ICD-10-CM | POA: Insufficient documentation

## 2014-03-22 DIAGNOSIS — E119 Type 2 diabetes mellitus without complications: Secondary | ICD-10-CM | POA: Insufficient documentation

## 2014-03-22 NOTE — Progress Notes (Signed)
Patient was seen on 03/22/14 for the complete diabetes self-management series at the [facility]. This is a part of the Link to [employer].  Current A1c = 8.1 on 01/2014  Handouts given during class include:  Living Well with Diabetes book  Carb Counting and Meal Planning book  Meal Plan Card  Carbohydrate guide  Meal planning worksheet  Low Sodium Flavoring Tips  The diabetes portion plate  Low Carbohydrate Snack Suggestions  A1c to eAG Conversion Chart  Diabetes Medications  Stress Management  Diabetes Recommended Care Schedule  Diabetes Success Plan  Core Class Satisfaction Survey  The following learning objectives were met by the patient during this course:  Describe diabetes  State some common risk factors for diabetes  Defines the role of glucose and insulin  Identifies type of diabetes and pathophysiology  Describe the relationship between diabetes and cardiovascular risk  State the members of the Healthcare Team  States the rationale for glucose monitoring  State when to test glucose  State their individual Target Range  State the importance of logging glucose readings  Describe how to interpret glucose readings  Identifies A1C target  Explain the correlation between A1c and eAG values  State symptoms and treatment of high blood glucose  State symptoms and treatment of low blood glucose  Explain proper technique for glucose testing  Identifies proper sharps disposal  Describe the role of different macronutrients on glucose  Explain how carbohydrates affect blood glucose  State what foods contain the most carbohydrates  Demonstrate carbohydrate counting  Demonstrate how to read Nutrition Facts food label  Describe effects of various fats on heart health  Describe the importance of good nutrition for health and healthy eating strategies  Describe techniques for managing your shopping, cooking and  meal planning  List strategies to follow meal plan when dining out  Describe the effects of alcohol on glucose and how to use it safely   State the amount of activity recommended for healthy living   Describe activities suitable for individual needs   Identify ways to regularly incorporate activity into daily life   Identify barriers to activity and ways to over come these barriers  Identify diabetes medications being personally used and their primary action for lowering glucose and possible side effects   Describe role of stress on blood glucose and develop strategies to address psychosocial issues   Identify diabetes complications and ways to prevent them  Explain how to manage diabetes during illness   Evaluate success in meeting personal goal   Establish 2-3 goals that they will plan to diligently work on until they return for the  52-month follow-up visit  Goals:  Follow Diabetes Meal Plan as instructed  Eat 3 meals and 2 snacks, every 3-5 hrs  Limit carbohydrate intake to 45 grams carbohydrate/meal Limit carbohydrate intake to 15 grams carbohydrate/snack Add lean protein foods to meals/snacks  Monitor glucose levels as instructed by your doctor  Aim for 15-30 mins of physical activity daily as tolerated  Bring food record and glucose log to all healthcare visits  Your patient has established the following 4 month goals in their individualized success plan:  Increase activity at least 3 times a wek  To manage stress I will start back with pilates  Your patient has identified these potential barriers to change:  Foot injury  Your patient has identified their diabetes self-care support plan as  [facility] Support Group  American Diabetes Association web site  coworkers  Plan: Attend Core 4 in 4 months

## 2014-03-25 ENCOUNTER — Other Ambulatory Visit (HOSPITAL_COMMUNITY): Payer: Self-pay | Admitting: Obstetrics and Gynecology

## 2014-03-25 DIAGNOSIS — Z1231 Encounter for screening mammogram for malignant neoplasm of breast: Secondary | ICD-10-CM

## 2014-04-02 ENCOUNTER — Ambulatory Visit (HOSPITAL_COMMUNITY): Payer: 59

## 2014-04-09 ENCOUNTER — Other Ambulatory Visit: Payer: Self-pay | Admitting: Podiatry

## 2014-04-10 ENCOUNTER — Ambulatory Visit (HOSPITAL_COMMUNITY)
Admission: RE | Admit: 2014-04-10 | Discharge: 2014-04-10 | Disposition: A | Discharge status: Home / Self Care | Payer: 59 | Source: Ambulatory Visit | Attending: Obstetrics and Gynecology | Admitting: Obstetrics and Gynecology

## 2014-04-10 DIAGNOSIS — Z1231 Encounter for screening mammogram for malignant neoplasm of breast: Secondary | ICD-10-CM

## 2014-04-22 ENCOUNTER — Encounter: Payer: Self-pay | Admitting: Podiatry

## 2014-04-22 ENCOUNTER — Ambulatory Visit (INDEPENDENT_AMBULATORY_CARE_PROVIDER_SITE_OTHER): Payer: 59 | Admitting: Podiatry

## 2014-04-22 ENCOUNTER — Ambulatory Visit (INDEPENDENT_AMBULATORY_CARE_PROVIDER_SITE_OTHER): Payer: 59

## 2014-04-22 ENCOUNTER — Ambulatory Visit (INDEPENDENT_AMBULATORY_CARE_PROVIDER_SITE_OTHER): Payer: Self-pay | Admitting: Family Medicine

## 2014-04-22 VITALS — BP 141/77 | HR 77 | Wt 271.0 lb

## 2014-04-22 DIAGNOSIS — M2040 Other hammer toe(s) (acquired), unspecified foot: Secondary | ICD-10-CM

## 2014-04-22 DIAGNOSIS — M778 Other enthesopathies, not elsewhere classified: Secondary | ICD-10-CM

## 2014-04-22 DIAGNOSIS — M7750 Other enthesopathy of unspecified foot: Secondary | ICD-10-CM

## 2014-04-22 DIAGNOSIS — E119 Type 2 diabetes mellitus without complications: Secondary | ICD-10-CM

## 2014-04-22 DIAGNOSIS — M779 Enthesopathy, unspecified: Secondary | ICD-10-CM

## 2014-04-22 NOTE — Progress Notes (Signed)
Patient presents to pharmacy for 3 mo f/u DM as part of the employee sponsored Link to Wellness schedule. Medications, insulin, regimen, and blood sugars have been reviewed. I have also discussed with patient lifestyle interventions such as diet and exercise. Full documentation of this visit can be found in the [employer] documenting system through [facility] However specifics of this visit include the following:  Diabetes Mellitus:  POC A1C 6.7, at goal. Has gotten back to goal since getting back on track with all her medicine and getting her metformin up to max dose. Has also lost 4 pounds. Overall, blood sugars are much improved. plan 1.) continue on current regimen and continue checking 2-3 times daily. 2.) f/u 3 mo 3.) provided lancing device today and lancets because patient lost hers.  Hypertension:  sligthly elevated today but hasnt taken 2 of her BP meds.   Plan Items:  Nutrition Counseling

## 2014-04-22 NOTE — Progress Notes (Signed)
She presents today with a chief complaint of pain about the second metatarsophalangeal joint of the left foot states it is been swelling terribly over the past few weeks and seems to be bluish purple by the end of the day. She states it is very painful.  Objective: Vital signs are stable she is alert and oriented x3. There is moderate edema there is pain on palpation of the second metatarsophalangeal joint and on end range of motion. The joint appears to be hard on palpation. Radiographic evaluation does demonstrate medial dislocation of the toe on the metatarsophalangeal joint this is a postsurgical foot. There appears to be soft tissue swelling around the joint radiographically.  Assessment: Capsulitis.  Plan: Performed a block with local anesthetic around the second metatarsal and metatarsophalangeal joint of the left foot. The area was prepped with Betadine. An arthrocentesis was performed removing a serosanguineous red bloody aspirate total of 1.1 cc from that joint. I then injected a small amount of Kenalog. Suggested that she continue to remain in her Darco shoe and I will followup with her in a couple of weeks.

## 2014-05-07 ENCOUNTER — Telehealth: Payer: Self-pay | Admitting: Family Medicine

## 2014-05-07 NOTE — Telephone Encounter (Signed)
Called Pt about DM care. When Pt calls back, please schedule appointment to check LDL, and A1C.

## 2014-05-15 ENCOUNTER — Ambulatory Visit (INDEPENDENT_AMBULATORY_CARE_PROVIDER_SITE_OTHER): Payer: 59 | Admitting: Podiatry

## 2014-05-15 ENCOUNTER — Encounter: Payer: Self-pay | Admitting: Podiatry

## 2014-05-15 VITALS — BP 138/80 | HR 94 | Resp 12

## 2014-05-15 DIAGNOSIS — M778 Other enthesopathies, not elsewhere classified: Secondary | ICD-10-CM

## 2014-05-15 DIAGNOSIS — M7750 Other enthesopathy of unspecified foot: Secondary | ICD-10-CM

## 2014-05-15 DIAGNOSIS — M779 Enthesopathy, unspecified: Principal | ICD-10-CM

## 2014-05-15 NOTE — Progress Notes (Signed)
She presents today complaining of pain to the second metatarsophalangeal joint of the left foot. The last time she was in we evacuated fluid from the joint and injected dexamethasone.  Objective: Vital signs are stable she is alert and oriented x3. The toe is still in the medial deviation and it is still painful on palpation periarticular.  Assessment: Capsulitis dislocation second digit left foot.  Plan: Injected periarticular Truman Hayward today with Kenalog and local anesthetic we did discuss the possible need for amputation at some point should this not resolve.

## 2014-06-05 ENCOUNTER — Encounter: Payer: 59 | Attending: Endocrinology | Admitting: *Deleted

## 2014-06-05 ENCOUNTER — Encounter: Payer: Self-pay | Admitting: *Deleted

## 2014-06-05 VITALS — Ht 69.0 in | Wt 266.8 lb

## 2014-06-05 DIAGNOSIS — Z713 Dietary counseling and surveillance: Secondary | ICD-10-CM | POA: Insufficient documentation

## 2014-06-05 DIAGNOSIS — E119 Type 2 diabetes mellitus without complications: Secondary | ICD-10-CM | POA: Insufficient documentation

## 2014-06-05 NOTE — Patient Instructions (Signed)
Plan:  Continue to aim for 3 Carb Choices per meal (45 grams) +/- 1 either way  Continue to aim for 0-2 Carbs per snack if hungry  Continue Include protein in moderation with your meals and snacks Continue reading food labels for Total Carbohydrate of foods Use 15 grams pure carbohydrate without added fat to treat low BGs Continue increasing your activity level by daily as tolerated Continue checking BG at alternate times per day as directed by MD  Consider recording when you have low BG so we can try and track them and correct the cause.

## 2014-06-05 NOTE — Progress Notes (Signed)
Patient ID: Amy Adams, female   DOB: 02/18/1966, 48 y.o.   MRN: [id number] ATTENDING PHYSICIAN NOTE: I have reviewed the chart and agree with the plan as detailed above. Arthur Speagle MD Pager [phone]  

## 2014-06-05 NOTE — Progress Notes (Signed)
Appt start time: 0730 end time:  0800.  Assessment:  Patient was seen on  06/05/14 for individual diabetes education follow up. She is happy with weight loss of 5 pounds since May. She has reduced Lantus from 70 to 60 units to try and prevent hypoglycemia.  States she still has hypoglycemia several times a week, usually when she doesn't eat as much for her meal as she planned. She is coming up with plans to bring carb choices with protein to have available at work. She is adjusting her Humalog based on size of meal and if BG is above 120 mg/dl. Her reported range of BG currently are between 120-180 pre and post meal. She continues to do exercises in the bed rather than walking to protect her feet. She attended Core Classes beginning on Mar 23, 2014  Current HbA1c: 8.1% in March, 2015  NOW DOWN TO 6.7% on 06/02/14!  Preferred Learning Style: Hands on  Learning Readiness:   Ready  Change in progress  MEDICATIONS: see list, diabetes medications are Humalog, Lantus and Metformin  DIETARY INTAKE:  24-hr recall:  B ( AM): fast food biscuit with egg,and Active drink (flavored water) or diet soda  Snk ( AM): lean meat and cheese, fresh fruit around 11 AM L ( PM): left overs from home, varies, protein, smaller serving of starch, vegetables, diet soda or water Snk ( PM): pretzels OR fresh fruit OR Nutrigrain bar, no more candy D ( PM): meat, vegetable and small serving of starch,  Snk ( PM): not usually due to later supper Beverages: water, Fit and Active flavored water, 2% milk  Usual physical activity: none right now due to recent foot surgery and recent injury to this foot.   TANITA  BODY COMP RESULTS 02/12/14  Weight 269.0 lb   BMI (kg/m^2) 39.7   Fat Mass (lbs) 142.0 lb   Fat Free Mass (lbs) 127.0 lb   Total Body Water (lbs) 93.0 lb   TANITA  BODY COMP RESULTS 02/27/14  Weight 274.0 lb   BMI (kg/m^2) 40.5   Fat Mass (lbs) 142.5 lb   Fat Free Mass (lbs) 131.5 lb   Total Body Water  (lbs) 96.5 lb   TANITA  BODY COMP RESULTS 03/12/14  Weight 272.5 lb   BMI (kg/m^2) 40.2   Fat Mass (lbs) 144.0 lb   Fat Free Mass (lbs) 128.5 lb   Total Body Water (lbs) 94.0 lb    TANITA  BODY COMP RESULTS 06/05/14  Weight 236.7 lb   BMI (kg/m^2) 39.5   Fat Mass (lbs) 142.0 lb   Fat Free Mass (lbs) 125.5 lb   Total Body Water (lbs) 92.0 lb   Estimated energy needs: 1600 calories 180 g carbohydrates 120 g protein 44 g fat  Progress Towards Goal(s):  In progress.   Nutritional Diagnosis:  NI-1.5 Excessive energy intake As related to activity level.  As evidenced by BMI of 40.2, now down to 39.5.    Intervention:  Nutrition counseling provided.  Reviewed Tanita Scale results which indicate loss of 2 pounds of Fat Mass, and of the 3 pounds Fat Free Mass lost, 2 pounds are from Total Body Water.  Plan:  Continue to aim for 3 Carb Choices per meal (45 grams) +/- 1 either way  Continue to aim for 0-2 Carbs per snack if hungry  Continue Include protein in moderation with your meals and snacks Continue reading food labels for Total Carbohydrate of foods Use 15 grams pure carbohydrate without added  fat to treat low BGs Continue increasing your activity level daily as tolerated Continue checking BG at alternate times per day as directed by MD  Suggest you decrease Lantus from 60 to 55 units twice a day since you are still having hypoglycemia when not able to eat on time. Consider recording when you have low BG so we can try and track them and correct the cause.      Teaching Method Utilized: Visual, Auditory and Hands on  Handouts given during visit include: Tanita Scale Report  Barriers to learning/adherence to lifestyle change: recent foot surgery has limited any activity  Diabetes self-care support plan:   [facility] support group  Co-workers and friends   Demonstrated degree of understanding via:  Teach Back   Monitoring/Evaluation:  Dietary intake, exercise, reading food  labels, and body weight in 2-3 months.

## 2014-06-17 ENCOUNTER — Ambulatory Visit (INDEPENDENT_AMBULATORY_CARE_PROVIDER_SITE_OTHER): Payer: 59 | Admitting: Podiatry

## 2014-06-17 ENCOUNTER — Encounter: Payer: Self-pay | Admitting: Podiatry

## 2014-06-17 VITALS — BP 122/80 | HR 94 | Resp 16

## 2014-06-17 DIAGNOSIS — M7750 Other enthesopathy of unspecified foot: Secondary | ICD-10-CM

## 2014-06-17 DIAGNOSIS — M778 Other enthesopathies, not elsewhere classified: Secondary | ICD-10-CM

## 2014-06-17 DIAGNOSIS — M779 Enthesopathy, unspecified: Secondary | ICD-10-CM

## 2014-06-17 NOTE — Progress Notes (Signed)
She presents today for followup of capsulitis second metatarsophalangeal joint of the left foot states I think is doing better.  Objective: Vital signs are stable she is alert and oriented x3. Swelling has decreased drastically around the second metatarsophalangeal joint of the left foot. Appears to be healing quite nicely. She has no pain on palpation to this joint.  Assessment: Healing second metatarsophalangeal joint capsulitis left.  Plan: Discussed etiology pathology conservative versus surgical therapies

## 2014-06-26 ENCOUNTER — Ambulatory Visit: Payer: 59 | Admitting: Podiatry

## 2014-07-01 ENCOUNTER — Other Ambulatory Visit: Payer: Self-pay | Admitting: Obstetrics and Gynecology

## 2014-07-02 LAB — CYTOLOGY - PAP

## 2014-07-24 ENCOUNTER — Ambulatory Visit (HOSPITAL_COMMUNITY): Payer: 59

## 2014-07-24 ENCOUNTER — Ambulatory Visit (HOSPITAL_COMMUNITY)
Admission: RE | Admit: 2014-07-24 | Discharge: 2014-07-24 | Disposition: A | Discharge status: Home / Self Care | Payer: 59 | Source: Ambulatory Visit | Attending: Endocrinology | Admitting: Endocrinology

## 2014-07-24 DIAGNOSIS — E049 Nontoxic goiter, unspecified: Secondary | ICD-10-CM | POA: Insufficient documentation

## 2014-08-11 ENCOUNTER — Encounter: Payer: 59 | Attending: Endocrinology | Admitting: *Deleted

## 2014-08-11 VITALS — Ht 69.0 in | Wt 264.0 lb

## 2014-08-11 DIAGNOSIS — E1149 Type 2 diabetes mellitus with other diabetic neurological complication: Secondary | ICD-10-CM

## 2014-08-11 DIAGNOSIS — Z713 Dietary counseling and surveillance: Secondary | ICD-10-CM | POA: Insufficient documentation

## 2014-08-11 DIAGNOSIS — E119 Type 2 diabetes mellitus without complications: Secondary | ICD-10-CM | POA: Insufficient documentation

## 2014-08-11 NOTE — Patient Instructions (Signed)
Plan:  Continue to aim for 3 Carb Choices per meal (45 grams) +/- 1 either way  Continue to aim for 0-2 Carbs per snack if hungry  Continue Include protein in moderation with your meals and snacks Continue reading food labels for Total Carbohydrate of foods Use 15 grams pure carbohydrate without added fat to treat low BGs Keep treatments for low BG at work for emergencies and the Glucose Tabs in your purse Continue increasing your activity level by daily as tolerated Continue checking BG at alternate times per day as directed by MD  Consider for this week, using Carb Ratio of 3 units per Carb choice and do not correct high BGs for this week so we can better assess your insulin requirements.

## 2014-08-12 ENCOUNTER — Encounter: Payer: Self-pay | Admitting: *Deleted
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
## 2014-08-13 ENCOUNTER — Encounter: Payer: Self-pay | Admitting: Podiatry

## 2014-08-13 ENCOUNTER — Ambulatory Visit (INDEPENDENT_AMBULATORY_CARE_PROVIDER_SITE_OTHER): Payer: 59

## 2014-08-13 ENCOUNTER — Ambulatory Visit (INDEPENDENT_AMBULATORY_CARE_PROVIDER_SITE_OTHER): Payer: 59 | Admitting: Podiatry

## 2014-08-13 VITALS — BP 148/89 | HR 84

## 2014-08-13 DIAGNOSIS — M779 Enthesopathy, unspecified: Secondary | ICD-10-CM

## 2014-08-13 DIAGNOSIS — R52 Pain, unspecified: Secondary | ICD-10-CM

## 2014-08-13 DIAGNOSIS — M778 Other enthesopathies, not elsewhere classified: Secondary | ICD-10-CM

## 2014-08-13 DIAGNOSIS — M7750 Other enthesopathy of unspecified foot: Secondary | ICD-10-CM

## 2014-08-13 NOTE — Progress Notes (Signed)
Patient ID: RAILEY GLAD, female   DOB: [birth year], 48 y.o.   MRN: [id number]  Subjective: Amy Adams returns the office today with complaints of left second digit pain. When asking when the pain is as she points to the second MTPJ. She states that she's previously had surgery by Dr. Milinda Pointer and has had repeated aspiration and steroid injections around the area do to capsulitis. She states that the last couple days she's had increasing pain into the area and some swelling. Denies any systemic complaints such as fevers, chills, nausea, vomiting. Denies any increased warmth or redness the area. No other complaints at this time. No acute changes since last appointment.  Objective: AAO x3, NAD DP/PT pulses palpable CRT < 3sec Decreased protective sensation with Simms Weinstein monofilament. Mild edema overlying the second MTPJ. Scar over the area which is well healed at this time. Mild tenderness upon range of motion of the second MTPJ. No overlying erythema/warmth, drainage. Medial deviation of the second digit. No tenderness of the digit or of the metatarsal itself. No open lesions. No calf pain, swelling, warmth.  Assessment:  48 year old female with left second MTPJ capsulitis  Plan: -X-rays were obtained and reviewed with the patient. -Conservative versus surgical treatment were discussed including alternatives, risks, complications. -At this time patient requested aspiration of the joint to remove removed some of the fluid. Under sterile skin preparation total of 2.5 cc was infiltrated in a regional block fashion around the joint. Next the area was sterilely prepped with Betadine. An 18-gauge steel was inserted into the second MTPJ and a small and is serous with slight hemorrhagic fluid was aspirated. No pus/crystals. Due to her multiple injections with steroid we'll hold off in any steroid injections at this time. Patient tolerated the aspiration well without any complications. -Continue with  Darco shoe -She has an appointment or reschedule with Dr. Milinda Pointer for followup evaluation. Call sooner with any questions, concerns, change in symptoms.

## 2014-08-26 ENCOUNTER — Ambulatory Visit (INDEPENDENT_AMBULATORY_CARE_PROVIDER_SITE_OTHER): Payer: 59 | Admitting: Podiatry

## 2014-08-26 VITALS — BP 148/94 | HR 86 | Resp 16

## 2014-08-26 DIAGNOSIS — M779 Enthesopathy, unspecified: Principal | ICD-10-CM

## 2014-08-26 DIAGNOSIS — M778 Other enthesopathies, not elsewhere classified: Secondary | ICD-10-CM

## 2014-08-26 DIAGNOSIS — M7752 Other enthesopathy of left foot: Secondary | ICD-10-CM

## 2014-08-26 DIAGNOSIS — M722 Plantar fascial fibromatosis: Secondary | ICD-10-CM

## 2014-08-26 NOTE — Progress Notes (Signed)
She presents today for followup of spasms in the plantar aspect of her left foot extending to her second toe. She still has a considerable amount of pain at the second metatarsophalangeal joint from his previous reconstruction. She also has pain on palpation medial continued tubercle of the left heel.  Objective: Pain on palpation medial continued tubercle of the left heel pulses are strongly palpable. Pain on range of motion of the second metatarsophalangeal joint left.  Assessment: Chronic pain second digit left foot. Plantar fasciitis with spasms of the sterile musculature.  Plan: Injected left heel today and discussed the possibility of amputation of the second digit of the left foot.

## 2014-08-28 ENCOUNTER — Ambulatory Visit (INDEPENDENT_AMBULATORY_CARE_PROVIDER_SITE_OTHER): Payer: Self-pay | Admitting: Family Medicine

## 2014-08-28 VITALS — BP 158/103 | HR 83 | Wt 264.0 lb

## 2014-08-28 DIAGNOSIS — E119 Type 2 diabetes mellitus without complications: Secondary | ICD-10-CM

## 2014-08-28 NOTE — Progress Notes (Signed)
Patient presents for 3 mo f/u DM as part of the employee sponsored Link to [employer]. Medications, glucometer, and insulin regimen have been reviewed. I have also discussed with patient lifestyle interventions such as diet and exercise. Full documentation of this visit can be found in the [employer] documenting system through [facility]). However, specifics areas of concern include the following:   Diabetes Mellitus:  POC A1C 6.5, at goal but patient has had 6 lows in 30 days. She has lost 7 lb since last visit. Her lantus has already been backed down from 70 units bid to 60 units bid but her humalog was backed down to a sliding (mostly does 10 units breakfast, 10 units lunch, and 17 for dinner). plan 1.) patient is heavily basalized. When she crashes, it's between meals. MD has given her room to play with her doses, especially since she is losing weight. counseled patient to back off lantus if she continues to go low because she has a lot of background insulin, that's why she's dropping between meals. 2.)f/u 3 mo Hyperlipidemia:on appropriately dosed statin  Hypertension: BP elevated today. Patient had missed medications this morning though. will monitor.  Patient has set a series of personal goals and will f/u in 3 mo for further review of DM

## 2014-09-05 ENCOUNTER — Other Ambulatory Visit: Payer: Self-pay | Admitting: Gastroenterology

## 2014-09-16 ENCOUNTER — Ambulatory Visit: Payer: 59 | Admitting: Podiatry

## 2014-09-25 ENCOUNTER — Encounter: Payer: Self-pay | Admitting: Podiatry

## 2014-09-25 ENCOUNTER — Ambulatory Visit (INDEPENDENT_AMBULATORY_CARE_PROVIDER_SITE_OTHER): Payer: 59 | Admitting: Podiatry

## 2014-09-25 VITALS — BP 150/87 | HR 93 | Resp 16

## 2014-09-25 DIAGNOSIS — M7752 Other enthesopathy of left foot: Secondary | ICD-10-CM

## 2014-09-25 DIAGNOSIS — R252 Cramp and spasm: Secondary | ICD-10-CM

## 2014-09-25 MED ORDER — CYCLOBENZAPRINE HCL 10 MG PO TABS: 10.0000 mg | ORAL_TABLET | Freq: Three times a day (TID) | ORAL | Status: DC | PRN

## 2014-09-26 NOTE — Progress Notes (Signed)
She presents today for chief complaint of spasms in the plantar aspect of her bilateral foot.  Objective: Vital signs are stable she is alert and oriented 3 no change in physical exam. Pulses are strongly palpable. No Pain. She still has pain on palpation second metatarsal phalangeal joint of the left foot.  Assessment: Muscle spasms of the bilateral foot.  Plan: Started her on Flexeril 10 mg 1 by mouth twice a day and I will follow-up with her in a few weeks.

## 2014-09-30 NOTE — Progress Notes (Signed)
Patient ID: Amy Adams, female   DOB: Jan 05, 1966, 48 y.o.   MRN: [id number] Reviewed: Agree with the documentation and management of our Amy Adams.

## 2014-11-20 ENCOUNTER — Ambulatory Visit: Payer: 59 | Admitting: Podiatry

## 2014-11-24 ENCOUNTER — Encounter (HOSPITAL_COMMUNITY): Payer: Self-pay

## 2014-11-24 ENCOUNTER — Emergency Department (HOSPITAL_COMMUNITY)
Admission: EM | Admit: 2014-11-24 | Discharge: 2014-11-24 | Disposition: A | Discharge status: Home / Self Care | Payer: 59 | Attending: Emergency Medicine | Admitting: Emergency Medicine

## 2014-11-24 DIAGNOSIS — E785 Hyperlipidemia, unspecified: Secondary | ICD-10-CM | POA: Diagnosis not present

## 2014-11-24 DIAGNOSIS — Y9389 Activity, other specified: Secondary | ICD-10-CM | POA: Insufficient documentation

## 2014-11-24 DIAGNOSIS — E079 Disorder of thyroid, unspecified: Secondary | ICD-10-CM | POA: Insufficient documentation

## 2014-11-24 DIAGNOSIS — G43909 Migraine, unspecified, not intractable, without status migrainosus: Secondary | ICD-10-CM | POA: Insufficient documentation

## 2014-11-24 DIAGNOSIS — X58XXXA Exposure to other specified factors, initial encounter: Secondary | ICD-10-CM | POA: Insufficient documentation

## 2014-11-24 DIAGNOSIS — Y9289 Other specified places as the place of occurrence of the external cause: Secondary | ICD-10-CM | POA: Diagnosis not present

## 2014-11-24 DIAGNOSIS — T383X1A Poisoning by insulin and oral hypoglycemic [antidiabetic] drugs, accidental (unintentional), initial encounter: Secondary | ICD-10-CM | POA: Insufficient documentation

## 2014-11-24 DIAGNOSIS — F419 Anxiety disorder, unspecified: Secondary | ICD-10-CM | POA: Diagnosis not present

## 2014-11-24 DIAGNOSIS — Z8739 Personal history of other diseases of the musculoskeletal system and connective tissue: Secondary | ICD-10-CM | POA: Diagnosis not present

## 2014-11-24 DIAGNOSIS — E119 Type 2 diabetes mellitus without complications: Secondary | ICD-10-CM | POA: Diagnosis not present

## 2014-11-24 DIAGNOSIS — Z79899 Other long term (current) drug therapy: Secondary | ICD-10-CM | POA: Insufficient documentation

## 2014-11-24 DIAGNOSIS — Z794 Long term (current) use of insulin: Secondary | ICD-10-CM | POA: Diagnosis not present

## 2014-11-24 DIAGNOSIS — Y998 Other external cause status: Secondary | ICD-10-CM | POA: Diagnosis not present

## 2014-11-24 DIAGNOSIS — I10 Essential (primary) hypertension: Secondary | ICD-10-CM | POA: Insufficient documentation

## 2014-11-24 HISTORY — DX: Essential (primary) hypertension: I10

## 2014-11-24 LAB — CBG MONITORING, ED
GLUCOSE-CAPILLARY: 156 mg/dL — AB (ref 70–99)
Glucose-Capillary: 117 mg/dL — ABNORMAL HIGH (ref 70–99)
Glucose-Capillary: 149 mg/dL — ABNORMAL HIGH (ref 70–99)
Glucose-Capillary: 81 mg/dL (ref 70–99)
Glucose-Capillary: 108 mg/dL — ABNORMAL HIGH (ref 70–99)
Glucose-Capillary: 116 mg/dL — ABNORMAL HIGH (ref 70–99)

## 2014-11-24 LAB — CBC
HEMATOCRIT: 44.5 % (ref 36.0–46.0)
HEMOGLOBIN: 15 g/dL (ref 12.0–15.0)
MCH: 28.5 pg (ref 26.0–34.0)
MCHC: 33.7 g/dL (ref 30.0–36.0)
MCV: 84.6 fL (ref 78.0–100.0)
Platelets: 207 10*3/uL (ref 150–400)
RBC: 5.26 MIL/uL — AB (ref 3.87–5.11)
RDW: 13.4 % (ref 11.5–15.5)
WBC: 5.8 10*3/uL (ref 4.0–10.5)

## 2014-11-24 LAB — COMPREHENSIVE METABOLIC PANEL
ALBUMIN: 4.5 g/dL (ref 3.5–5.2)
ALT: 16 U/L (ref 0–35)
ANION GAP: 6 (ref 5–15)
AST: 25 U/L (ref 0–37)
Alkaline Phosphatase: 103 U/L (ref 39–117)
BUN: 11 mg/dL (ref 6–23)
CALCIUM: 9.1 mg/dL (ref 8.4–10.5)
CO2: 26 mmol/L (ref 19–32)
Chloride: 105 mEq/L (ref 96–112)
Creatinine, Ser: 0.56 mg/dL (ref 0.50–1.10)
GFR calc Af Amer: >90 mL/min (ref >90)
GLUCOSE: 118 mg/dL — AB (ref 70–99)
Potassium: 3.4 mmol/L — ABNORMAL LOW (ref 3.5–5.1)
Sodium: 137 mmol/L (ref 135–145)
TOTAL PROTEIN: 7.5 g/dL (ref 6.0–8.3)
Total Bilirubin: 0.9 mg/dL (ref 0.3–1.2)

## 2014-11-24 NOTE — ED Notes (Signed)
Per EMS- Patient reports that she took 45 units Humalog instead of 45 units Lantus. Beginning blood sugar 224. Patient drank 2 Dr. Samson Frederic prior to EMS arrival. Last blood sugar-137.

## 2014-11-24 NOTE — ED Notes (Signed)
Bed: WA12 Expected date:  Expected time:  Means of arrival:  Comments: EMS 

## 2014-11-24 NOTE — ED Notes (Signed)
Sandwich given to patient.

## 2014-11-24 NOTE — Discharge Instructions (Signed)
Call for a follow up appointment with a Family or Primary Care Provider.  °Return if Symptoms worsen.   °Take medication as prescribed.  ° °

## 2014-11-24 NOTE — ED Provider Notes (Signed)
CSN: [id number]     Arrival date & time 11/24/14  0849 History   First MD Initiated Contact with Patient 11/24/14 [phone]     Chief Complaint  Patient presents with  . took wrong insulin      (Consider location/radiation/quality/duration/timing/severity/associated sxs/prior Treatment) HPI Comments: The patient 49 year old female with past medical history of diabetes, anxiety, thyroid disease presenting in emergency department chief complaint of actually overdose on insulin. Patient reports mistakenly taking her Humalog 45 units this morning at approximately 0700.  Patient's normal dose is 3-4 units. Patient reports prior to taking Humalog CBG of 190.  Prior to arrival pt CBG 290. Patient denies eating food prior to arrival, reports drinking 2 Dr. Samson Frederic. She reports mild blurred vision bilaterally no other complaints. Patient's last Lantus 1930.  The history is provided by the patient. No language interpreter was used.    Past Medical History  Diagnosis Date  . Diabetes mellitus without complication   . Arthritis   . Anxiety   . History of migraine headaches   . Thyroid disease   . Hyperlipidemia   . Hypertension    Past Surgical History  Procedure Laterality Date  . Metatarsal osteotomy Left   . Hammer toe surgery Left   . Endoscopic plantar fasciotomy Left   . Phenol matrixectomy Right     1st toenail  . Abdominal hysterectomy    . Carpal tunnel release    . Tonsillectomy    . Cesarean section     Family History  Problem Relation Age of Onset  . Diabetes Mother   . Migraines Mother   . Diabetes Sister    History  Substance Use Topics  . Smoking status: Never Smoker   . Smokeless tobacco: Never Used  . Alcohol Use: No   OB History    No data available     Review of Systems  Eyes: Positive for visual disturbance.  Respiratory: Negative for shortness of breath.   Gastrointestinal: Negative for nausea.  Neurological: Negative for tremors, light-headedness,  numbness and headaches.      Allergies  Dilaudid; Doxycycline; and Trazodone and nefazodone  Home Medications   Prior to Admission medications   Medication Sig Start Date End Date Taking? Authorizing Provider  ALPRAZolam Duanne Moron) 1 MG tablet Take 1 mg by mouth 3 (three) times daily as needed for sleep.    Historical Provider, MD  amLODipine (NORVASC) 10 MG tablet Take 10 mg by mouth daily.    Historical Provider, MD  atorvastatin (LIPITOR) 40 MG tablet Take 40 mg by mouth daily.    Historical Provider, MD  cyclobenzaprine (FLEXERIL) 10 MG tablet Take 1 tablet (10 mg total) by mouth 3 (three) times daily as needed for muscle spasms. 09/25/14   Max T Hyatt, DPM  DULoxetine (CYMBALTA) 60 MG capsule Take 120 mg by mouth at bedtime.    Historical Provider, MD  estradiol (ESTRACE) 0.5 MG tablet Take 0.5 mg by mouth daily.    Historical Provider, MD  fenofibrate micronized (LOFIBRA) 134 MG capsule Take 134 mg by mouth daily.    Historical Provider, MD  folic acid (FOLVITE) 1 MG tablet Take 2 mg by mouth daily.     Historical Provider, MD  gabapentin (NEURONTIN) 400 MG capsule Take 1 capsule (400 mg total) by mouth 3 (three) times daily. 09/10/13   Max T Hyatt, DPM  hydrochlorothiazide (HYDRODIURIL) 25 MG tablet Take 25 mg by mouth daily.    Historical Provider, MD  hydroxychloroquine (PLAQUENIL) 200 MG  tablet Take 200 mg by mouth 2 (two) times daily.    Historical Provider, MD  insulin glargine (LANTUS SOLOSTAR) 100 UNIT/ML injection Inject 60 Units into the skin 2 (two) times daily.     Historical Provider, MD  insulin lispro (HUMALOG KWIKPEN) 100 UNIT/ML injection Inject 10-17 Units into the skin 3 (three) times daily before meals. Sliding scale    Historical Provider, MD  levocetirizine (XYZAL) 5 MG tablet Take 5 mg by mouth every evening.    Historical Provider, MD  levothyroxine (SYNTHROID, LEVOTHROID) 50 MCG tablet Take 50 mcg by mouth daily.    Historical Provider, MD  losartan (COZAAR) 100  MG tablet Take 100 mg by mouth daily.    Historical Provider, MD  metFORMIN (GLUCOPHAGE-XR) 500 MG 24 hr tablet Take 1,000 mg by mouth 2 (two) times daily.     Historical Provider, MD  methotrexate 25 MG/ML SOLN 20 mg once. Inject 0.8 mL once weekly at bedtime    Historical Provider, MD  montelukast (SINGULAIR) 10 MG tablet Take 10 mg by mouth at bedtime.    Historical Provider, MD  omeprazole-sodium bicarbonate (ZEGERID) 40-1100 MG per capsule Take 1 capsule by mouth 2 (two) times daily.    Historical Provider, MD  pantoprazole (PROTONIX) 40 MG tablet Take 40 mg by mouth 2 (two) times daily as needed.     Historical Provider, MD  traMADol (ULTRAM) 50 MG tablet Take 1 tablet (50 mg total) by mouth every 8 (eight) hours as needed. 11/19/13   Max T Hyatt, DPM  zolpidem (AMBIEN) 10 MG tablet Take 10 mg by mouth at bedtime as needed for sleep.    Historical Provider, MD   BP 144/86 mmHg  Pulse 86  Temp(Src) 98.1 F (36.7 C) (Oral)  Resp 18  SpO2 100% Physical Exam  Constitutional: She is oriented to person, place, and time. She appears well-developed and well-nourished. No distress.  HENT:  Head: Normocephalic and atraumatic.  Neck: Neck supple.  Cardiovascular: Normal rate and regular rhythm.   Pulmonary/Chest: Effort normal. No respiratory distress. She has no wheezes. She has no rales.  Neurological: She is alert and oriented to person, place, and time. She displays no tremor.  Skin: Skin is warm and dry. She is not diaphoretic.  Psychiatric: She has a normal mood and affect. Her behavior is normal.  Nursing note and vitals reviewed.   ED Course  Procedures (including critical care time) Labs Review Labs Reviewed  CBC - Abnormal; Notable for the following:    RBC 5.26 (*)    All other components within normal limits  COMPREHENSIVE METABOLIC PANEL - Abnormal; Notable for the following:    Potassium 3.4 (*)    Glucose, Bld 118 (*)    All other components within normal limits  CBG  MONITORING, ED - Abnormal; Notable for the following:    Glucose-Capillary 117 (*)    All other components within normal limits  CBG MONITORING, ED - Abnormal; Notable for the following:    Glucose-Capillary 149 (*)    All other components within normal limits  CBG MONITORING, ED - Abnormal; Notable for the following:    Glucose-Capillary 108 (*)    All other components within normal limits  CBG MONITORING, ED - Abnormal; Notable for the following:    Glucose-Capillary 156 (*)    All other components within normal limits  CBG MONITORING, ED - Abnormal; Notable for the following:    Glucose-Capillary 116 (*)    All other components within  normal limits  CBG MONITORING, ED    Imaging Review No results found.   EKG Interpretation None      MDM   Final diagnoses:  Insulin overdose, accidental or unintentional, initial encounter   Patient presents after accidentally overdose at Humalog. Initial CBG on 117,  patient given juice. mentating appropriately. Repeat CBG 81 patient mentating appropriately given more juice and crackers. 10: 19 Repeat CBG 108 Patient's CBG above 100 with normal mentation. Reevaluation patient reports persistent blurry vision, consistent with "ups and downs" of insulin. Patient requesting be discharged home. Advised patient to continue eating and follow-up with her endocrinologist.   Harvie Heck, PA-C 11/24/14 [place], MD 11/24/14 [phone]

## 2014-12-02 ENCOUNTER — Telehealth: Payer: Self-pay | Admitting: *Deleted

## 2014-12-02 NOTE — Telephone Encounter (Signed)
I called to inform her that Dr. Marta Antu a refill for the compound cream.  (Keta/Bup/Doxe/Gaba/Nife/Pent/Top 10/1/3/6/2/3/1 % Cream)  "Someone called the refill in yesterday.  Do you want to give her additional refills to have on hold?"  I told her no, just this one at this time.

## 2014-12-04 ENCOUNTER — Ambulatory Visit (INDEPENDENT_AMBULATORY_CARE_PROVIDER_SITE_OTHER): Payer: 59 | Admitting: Podiatry

## 2014-12-04 ENCOUNTER — Encounter: Payer: Self-pay | Admitting: Podiatry

## 2014-12-04 VITALS — BP 163/95 | HR 91 | Resp 13

## 2014-12-04 DIAGNOSIS — M79605 Pain in left leg: Secondary | ICD-10-CM

## 2014-12-04 DIAGNOSIS — M7752 Other enthesopathy of left foot: Secondary | ICD-10-CM | POA: Diagnosis not present

## 2014-12-04 NOTE — Progress Notes (Signed)
She presents today complaining of painful second metatarsal phalangeal joint and toe left foot. She states that she would like to have this toe removed because it is painful and starting to roll. She says it feels like his drawing on all of the other toes.  Objective: Vital signs are stable she is alert and oriented 3. Pulses are palpable left. She has a large nonpulsatile mass second metatarsophalangeal joint which is primarily scar tissue and edema that has really resulted in a rupture of the second metatarsophalangeal joint.  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  Plan: We discussed the need for amputation and reconstruction of the area. We consented her for an amputation of the toe today we discussed the possible pulse postop complications which may include but are not limited to postop pain bleeding swelling infection recurrence need for further surgery loss of digit loss of limb also like. I answered all the questions regarding these procedures best of my ability in layman's terms. Follow up with her in the near future for surgery.

## 2014-12-12 ENCOUNTER — Ambulatory Visit (INDEPENDENT_AMBULATORY_CARE_PROVIDER_SITE_OTHER): Payer: 59 | Admitting: Family Medicine

## 2014-12-12 VITALS — BP 157/109 | HR 83 | Wt 265.0 lb

## 2014-12-12 DIAGNOSIS — E119 Type 2 diabetes mellitus without complications: Secondary | ICD-10-CM

## 2014-12-12 NOTE — Progress Notes (Signed)
Patient presents for 3 mo f/u DM as part of the employee sponsored Link to [employer]. Medications and glucose readings have been reviewed. I have also discussed with patient lifestyle interventions such as diet and exercise. Full documentation of this visit can be found in the [employer] documenting system through [facility]). However, specifics from this visit include the following:  Diabetes Mellitus: POC A1C 6.8, at goal. Her MD has decreased both her insulins quite a bit because she was going low. Her blood sugars do look higher for the month of January, however, but she has been fighting a cold/sinus infection plan 1.) Not checking blood sugars that frequently. Averaging about once per day if that. Hasn't been checking as much lately because she states they have been higher and she doesn't want to see them. She will start checking fasting everyday and get a few 2 h PP checks. 2.) f/u 3 mo  Hypertension: BP elevated. When patient got home and loooked at her medicine for me, she realized she was taking atorvastatin 20 and 40 mg instead of her amlodipine. Verified that patient does have amlodipine at home. She restarted the medication back and will be monitoring her BP. Reiterated blood pressure target of <140/90 and to call me if it does not come down. Counseled on utilization of a pill box with large medication burden and told patient to get rid of the 20 mg atorvastatin since she is supposed to be on the 40 mg.    Patient has set a series of goals and will f/u in 3 mo for further review of DM.

## 2014-12-22 NOTE — Progress Notes (Signed)
Patient ID: Amy Adams, female   DOB: Jul 29, 1966, 49 y.o.   MRN: [id number] ATTENDING PHYSICIAN NOTE: I have reviewed the chart and agree with the plan as detailed above. Dorcas Mcmurray MD Pager [phone]

## 2014-12-23 ENCOUNTER — Telehealth: Payer: Self-pay | Admitting: *Deleted

## 2014-12-23 NOTE — Telephone Encounter (Signed)
"  I'm calling to set up an appointment for surgery.  He said it wasn't going to get any better but I wanted to see.  It hasn't.  I've already signed the consent forms.  I'd like to do it early in the morning because I'm Diabetic and I don't want to get sick."  When would you like to schedule?  "I'd like to do it on 01/30/2015 if it's available."  Okay, I'll get it scheduled.

## 2015-01-02 ENCOUNTER — Telehealth: Payer: Self-pay | Admitting: *Deleted

## 2015-01-02 NOTE — Telephone Encounter (Signed)
"  I have questions about surgery."  I returned her call.  "Do I need to have any bloodwork done before surgery?"  No, you do not have to have any bloodwork done prior to surgery.   "I think I popped another toe.  I'd like to schedule an appointment to see him prior to having the surgery. I have Neuropathy so I don't feel pain in it.  I want to make sure it is okay before I have the surgery."  I scheduled her for 01/06/15 at 1:30pm.

## 2015-01-06 ENCOUNTER — Ambulatory Visit: Payer: Self-pay | Admitting: Podiatry

## 2015-01-06 ENCOUNTER — Ambulatory Visit (INDEPENDENT_AMBULATORY_CARE_PROVIDER_SITE_OTHER): Payer: 59

## 2015-01-06 ENCOUNTER — Encounter: Payer: Self-pay | Admitting: Podiatry

## 2015-01-06 ENCOUNTER — Ambulatory Visit (INDEPENDENT_AMBULATORY_CARE_PROVIDER_SITE_OTHER): Payer: 59 | Admitting: Podiatry

## 2015-01-06 VITALS — BP 133/79 | HR 86 | Resp 12

## 2015-01-06 DIAGNOSIS — M779 Enthesopathy, unspecified: Secondary | ICD-10-CM

## 2015-01-06 DIAGNOSIS — S92912A Unspecified fracture of left toe(s), initial encounter for closed fracture: Secondary | ICD-10-CM

## 2015-01-06 NOTE — Progress Notes (Signed)
   Subjective:    Patient ID: Amy Adams, female    DOB: 15-Jan-1966, 49 y.o.   MRN: [id number]  Foot Injury       Review of Systems  Constitutional: Positive for fatigue.  HENT: Positive for congestion and sinus pressure.   All other systems reviewed and are negative.      Objective:   Physical Exam: Pulses remain palpable to the left foot. Severe dislocation of the second digit at the metatarsophalangeal joint. This is well known to [place]. She does appear to have some swelling at the PIPJ area of the third digit left foot. Radiographic evaluation does demonstrates oblique fracture of the proximal phalanx third digit left foot nondisplaced non-comminuted. This simply appears to be a stress fracture.        Assessment & Plan:  Assessment: Stress fracture third digit left foot history diabetes and peripheral neuropathy.  Plan: Demonstrated to her today how to tape the toe. I also discussed her previously scheduled surgery with [place] for amputation of her second digit left foot.

## 2015-01-13 ENCOUNTER — Other Ambulatory Visit (HOSPITAL_COMMUNITY): Payer: Self-pay | Admitting: Endocrinology

## 2015-01-13 DIAGNOSIS — E049 Nontoxic goiter, unspecified: Secondary | ICD-10-CM

## 2015-01-13 HISTORY — PX: TOE AMPUTATION: SHX809

## 2015-01-28 ENCOUNTER — Other Ambulatory Visit: Payer: Self-pay | Admitting: Podiatry

## 2015-01-28 MED ORDER — CEPHALEXIN 500 MG PO CAPS: 500.0000 mg | ORAL_CAPSULE | Freq: Three times a day (TID) | ORAL | Status: DC

## 2015-01-28 MED ORDER — PROMETHAZINE HCL 25 MG PO TABS: 25.0000 mg | ORAL_TABLET | Freq: Three times a day (TID) | ORAL | Status: DC | PRN

## 2015-01-28 MED ORDER — OXYCODONE-ACETAMINOPHEN 10-325 MG PO TABS: ORAL_TABLET | ORAL | Status: DC

## 2015-01-30 ENCOUNTER — Encounter: Payer: Self-pay | Admitting: Podiatry

## 2015-01-30 DIAGNOSIS — S92912S Unspecified fracture of left toe(s), sequela: Secondary | ICD-10-CM | POA: Diagnosis not present

## 2015-01-30 DIAGNOSIS — Z4889 Encounter for other specified surgical aftercare: Secondary | ICD-10-CM | POA: Diagnosis not present

## 2015-02-04 NOTE — Progress Notes (Signed)
DOS 01/30/2015 Amputation 2nd toe left foot.

## 2015-02-05 ENCOUNTER — Ambulatory Visit (INDEPENDENT_AMBULATORY_CARE_PROVIDER_SITE_OTHER): Payer: 59 | Admitting: Podiatry

## 2015-02-05 ENCOUNTER — Encounter: Payer: Self-pay | Admitting: Podiatry

## 2015-02-05 ENCOUNTER — Ambulatory Visit (INDEPENDENT_AMBULATORY_CARE_PROVIDER_SITE_OTHER): Payer: 59

## 2015-02-05 VITALS — BP 140/79 | HR 95 | Resp 16

## 2015-02-05 DIAGNOSIS — Z9889 Other specified postprocedural states: Secondary | ICD-10-CM

## 2015-02-05 DIAGNOSIS — M79673 Pain in unspecified foot: Secondary | ICD-10-CM

## 2015-02-05 NOTE — Progress Notes (Signed)
She presents today 1 week status post amputation disarticulation second digit left foot at the metatarsophalangeal joint. She denies fever chills nausea vomiting muscle aches pains.  Objective: Presents today with a Darco shoe plaster dressing intact. Once revealed surgical site demonstrates no erythema cellulitis drainage or odor sutures are intact margins are well coapted. Radiographs demonstrate disarticulation second digit left pockets and no infection.  Assessment: Well-healed surgical foot status post 1 week and dictation second digit left.  Plan: She will follow up next week with Dr. Earleen Newport for dressing change. I doubt the stitches will be ready to be removed at that point.

## 2015-02-11 DIAGNOSIS — R52 Pain, unspecified: Secondary | ICD-10-CM

## 2015-02-13 ENCOUNTER — Encounter: Payer: Self-pay | Admitting: Podiatry

## 2015-02-13 ENCOUNTER — Ambulatory Visit (INDEPENDENT_AMBULATORY_CARE_PROVIDER_SITE_OTHER): Payer: 59 | Admitting: Podiatry

## 2015-02-13 VITALS — BP 139/78 | HR 64 | Resp 12

## 2015-02-13 DIAGNOSIS — Z9889 Other specified postprocedural states: Secondary | ICD-10-CM

## 2015-02-13 MED ORDER — OXYCODONE-ACETAMINOPHEN 10-325 MG PO TABS: ORAL_TABLET | ORAL | Status: DC

## 2015-02-13 NOTE — Progress Notes (Signed)
Patient ID: Amy Adams, female   DOB: Jan 01, 1966, 49 y.o.   MRN: [id number]   Subjective: 49 year old female presents the office today 2 weeks status post second toe amputation. She states that she continues to have some mild intermittent discomfort. She continues with when the surgical shoe. She states that she is taking tramadol for pain however she was taking the Percocet intermittently. She is asking for refill on Percocet in case she needs it. She denies any systemic complaints as fevers, chills, nausea, vomiting. Denies any calf pain, chest pain, soreness of breath. No other complaints at this time.  Objective: AAO 3, NAD  DP/PT pulses palpable, CRT less than 3 seconds Dressing clean, dry, intact. Status post left second toe amputation. Sutures are intact without any evidence of dehiscence. There is mild edema overlying the procedure site without any surrounding erythema, drainage, malodor or any other clinical signs of infection. There is mild tenderness directly on palpation of the surgical site. There is no other areas of tenderness to bilateral lower extremities. No other areas of edema, erythema, increase in warmth. No other open lesions or pre-ulcerative lesions identified bilaterally. No pain with calf compression, swelling, warmth, erythema.  Assessment : 49 year old female 2 weeks status post left second toe amputation, healing well  Plan: -Treatment options were discussed with the patient include alternatives, risks, complications. -Antibiotic ointment was placed over the incision followed by dry sterile dressing. Recommend daily dressing clean, dry, intact. -Continue a surgical shoe. -Pain medication as needed. Refill Percocet. -Elevation -Follow-up in 1 week with Dr. Milinda Pointer for likely suture removal or sooner if any problems are to arise. In the meantime occurs call the office with any questions, concerns, change in symptoms.

## 2015-02-18 ENCOUNTER — Telehealth: Payer: Self-pay | Admitting: *Deleted

## 2015-02-18 NOTE — Telephone Encounter (Signed)
Pt states she has had some numbness in her toes after Dr. Jacqualyn Posey wrapped with an ace wrap.  I instructed pt to remove the ace and elevate the foot for 15 minutes and then rewrap the foot looser, and call with concerns, and keep 02/19/2015 315pm appt.  Pt agreed.

## 2015-02-19 ENCOUNTER — Ambulatory Visit (INDEPENDENT_AMBULATORY_CARE_PROVIDER_SITE_OTHER): Payer: 59 | Admitting: Podiatry

## 2015-02-19 ENCOUNTER — Encounter: Payer: Self-pay | Admitting: Podiatry

## 2015-02-19 VITALS — BP 136/82 | HR 81 | Resp 14

## 2015-02-19 DIAGNOSIS — Z9889 Other specified postprocedural states: Secondary | ICD-10-CM

## 2015-02-19 NOTE — Progress Notes (Signed)
She presents today 3 weeks status post amputation disarticulation second digit of the left foot. He states that she's doing quite well. She denies fever chills nausea vomiting muscle aches and pains and states that her blood sugar has been running very close to normal.  Objective: Vital signs are stable she is alert and oriented 3 minimal edema no erythema saline as drainage or odor to the surgical site overlying the second metatarsal phalangeal joint area left foot. Sutures appear to be intact and margins appear to be well coapted. Sutures were removed today margins remain well coapted.  Assessment: Well-healed surgical for 3 weeks out.  Plan: Redressed today with a dry sterile compressive dressing after sutures were removed encouraged to leave this dry for the next day or 2 and then she may start while shoeing. I encouraged her to stay in the Cam Walker or Darco shoe and I will follow-up with her in 2-3 weeks to make sure that says this is healing well and get her back into a regular shoe at that point.

## 2015-02-23 ENCOUNTER — Telehealth: Payer: Self-pay

## 2015-02-23 ENCOUNTER — Encounter: Payer: Self-pay | Admitting: Podiatry

## 2015-02-23 NOTE — Telephone Encounter (Signed)
Pt called concerned about soaking her foot, she states that she has a scab over the suture line and is afraid soaking it would open the area back up. She was advised by Dr Milinda Pointer to soak her foot, so I advised her to start soaking once daily for about 15 minutes in epsom salt and warm water, allow area to dry well before redressing. If area re-opens or begins to drain she is to call office for advice or appt.

## 2015-03-05 ENCOUNTER — Ambulatory Visit (HOSPITAL_COMMUNITY)
Admission: RE | Admit: 2015-03-05 | Discharge: 2015-03-05 | Disposition: A | Discharge status: Home / Self Care | Payer: 59 | Source: Ambulatory Visit | Attending: Endocrinology | Admitting: Endocrinology

## 2015-03-05 ENCOUNTER — Encounter: Payer: Self-pay | Admitting: Podiatry

## 2015-03-05 ENCOUNTER — Ambulatory Visit (INDEPENDENT_AMBULATORY_CARE_PROVIDER_SITE_OTHER): Payer: 59 | Admitting: Podiatry

## 2015-03-05 VITALS — BP 127/81 | HR 92 | Resp 16

## 2015-03-05 DIAGNOSIS — E049 Nontoxic goiter, unspecified: Secondary | ICD-10-CM | POA: Diagnosis present

## 2015-03-05 DIAGNOSIS — Z9889 Other specified postprocedural states: Secondary | ICD-10-CM

## 2015-03-05 NOTE — Progress Notes (Signed)
She presents today post surgical amputation second digit left foot. She states that she is doing well that she is getting some pain and burning as it swells and she elevates it at nighttime.  Objective: Vital signs are stable she is alert and oriented 3. Pulses are palpable. Mild swelling at the surgical site. The incision is gone on to heal 100%. Mild tenderness on palpation.  Assessment: Well-healing surgical foot left.  Plan: I would allow her to get back to work in regular exercise routine follow-up with me in 1 month she also has mild heel pain which we will reinject next visit

## 2015-03-16 ENCOUNTER — Telehealth: Payer: Self-pay | Admitting: *Deleted

## 2015-03-16 ENCOUNTER — Other Ambulatory Visit: Payer: Self-pay | Admitting: Internal Medicine

## 2015-03-16 DIAGNOSIS — E041 Nontoxic single thyroid nodule: Secondary | ICD-10-CM

## 2015-03-16 NOTE — Telephone Encounter (Addendum)
Pt called complains of swelling and knot below the amputation site, and feels a burning.  I left message at pt's home and mobile encouraging pt to make an appt and I would also inform Dr. Milinda Pointer of her concerns.  Left message [phone] with Melissa for pt to call our office.  Pt is scheduled for appt on 03/19/2015 with Dr. Milinda Pointer.

## 2015-03-18 ENCOUNTER — Other Ambulatory Visit (HOSPITAL_COMMUNITY): Payer: Self-pay | Admitting: Internal Medicine

## 2015-03-18 ENCOUNTER — Ambulatory Visit (HOSPITAL_COMMUNITY): Payer: 59

## 2015-03-18 DIAGNOSIS — E049 Nontoxic goiter, unspecified: Secondary | ICD-10-CM

## 2015-03-19 ENCOUNTER — Encounter: Payer: Self-pay | Admitting: Podiatry

## 2015-03-19 ENCOUNTER — Ambulatory Visit (INDEPENDENT_AMBULATORY_CARE_PROVIDER_SITE_OTHER): Payer: 59 | Admitting: Podiatry

## 2015-03-19 ENCOUNTER — Ambulatory Visit: Payer: 59

## 2015-03-19 VITALS — BP 137/83 | HR 83 | Resp 16

## 2015-03-19 DIAGNOSIS — Z9889 Other specified postprocedural states: Secondary | ICD-10-CM

## 2015-03-19 DIAGNOSIS — M2042 Other hammer toe(s) (acquired), left foot: Secondary | ICD-10-CM

## 2015-03-19 NOTE — Progress Notes (Signed)
She presents today status post amputation second digit left foot. She states that the top part of her left foot was bothering her just the other day. She states that is doing much better at this point. She also states that the plantar fasciitis is resolving.  Objective: Vital signs are stable she is alert and oriented 3. There is no erythema edema saline as drainage or odor. She has minimal tenderness on palpation of the surgical site.  Assessment: Well-healing surgical foot left confirmed by radiographs.  Plan: Follow up with me in 1 month.

## 2015-03-27 ENCOUNTER — Ambulatory Visit (HOSPITAL_COMMUNITY): Admission: RE | Admit: 2015-03-27 | Payer: 59 | Source: Ambulatory Visit

## 2015-04-01 ENCOUNTER — Encounter: Payer: 59 | Attending: Family Medicine | Admitting: *Deleted

## 2015-04-01 ENCOUNTER — Encounter: Payer: Self-pay | Admitting: *Deleted

## 2015-04-01 DIAGNOSIS — E118 Type 2 diabetes mellitus with unspecified complications: Secondary | ICD-10-CM | POA: Diagnosis present

## 2015-04-01 DIAGNOSIS — Z713 Dietary counseling and surveillance: Secondary | ICD-10-CM | POA: Insufficient documentation

## 2015-04-01 DIAGNOSIS — Z794 Long term (current) use of insulin: Secondary | ICD-10-CM | POA: Diagnosis not present

## 2015-04-01 NOTE — Patient Instructions (Signed)
Plan:  Continue to aim for 3 Carb Choices per meal (45 grams) +/- 1 either way  Continue to aim for 0-2 Carbs per snack if hungry  Continue Include protein in moderation with your meals and snacks Continue reading food labels for Total Carbohydrate of foods Use 15 grams pure carbohydrate without added fat to treat low BGs Keep treatments for low BG at work for emergencies and the Glucose Tabs in your purse Continue checking BG at alternate times per day as directed by MD  Continue using Carb Ratio of 3 units per Carb choice  Consider decreasing your Lantus back to 45 units now that you are back at work and having low's before each meal. Ask Dr. Chalmers Cater her opinion about pump therapy for you with your variability of BG's each day Check with Link to Wellness about coverage of pumps.

## 2015-04-01 NOTE — Progress Notes (Signed)
Appt start time: 0730 end time:  0800.  Assessment:  Patient was seen on  04/01/15 for individual diabetes education follow up. Doing well with Carb Counting, taking 3 units per carb choice. She is not always getting her snacks in between meals, and then her BG's drop. She often  feels full and then doesn't feel like eating but has to in order to prevent low BG. This leads to more variability with her BG's and frustration with low BG's.   Current HbA1c: 8.1% in March, 2015  NOW DOWN TO 6.7% on 06/02/14!  Preferred Learning Style: Hands on  Learning Readiness:   Ready  Change in progress  MEDICATIONS: see list, diabetes medications are Humalog, Lantus and Metformin  DIETARY INTAKE:  24-hr recall:  B ( AM): fast food biscuit with egg,and Active drink (flavored water) or diet soda  Snk ( AM): lean meat and cheese, fresh fruit around 11 AM L ( PM): left overs from home, varies, protein, smaller serving of starch, vegetables, diet soda or water Snk ( PM): pretzels OR fresh fruit OR Nutrigrain bar, no more candy D ( PM): meat, vegetable and small serving of starch,  Snk ( PM): not usually due to later supper Beverages: water, Fit and Active flavored water, 2% milk  Usual physical activity: none right now due to recent foot surgery and recent injury to this foot.   TANITA  BODY COMP RESULTS 02/12/14  Weight 269.0 lb   BMI (kg/m^2) 39.7   Fat Mass (lbs) 142.0 lb   Fat Free Mass (lbs) 127.0 lb   Total Body Water (lbs) 93.0 lb   TANITA  BODY COMP RESULTS 02/27/14  Weight 274.0 lb   BMI (kg/m^2) 40.5   Fat Mass (lbs) 142.5 lb   Fat Free Mass (lbs) 131.5 lb   Total Body Water (lbs) 96.5 lb   TANITA  BODY COMP RESULTS 03/12/14  Weight 272.5 lb   BMI (kg/m^2) 40.2   Fat Mass (lbs) 144.0 lb   Fat Free Mass (lbs) 128.5 lb   Total Body Water (lbs) 94.0 lb    TANITA  BODY COMP RESULTS 06/05/14  Weight 236.7 lb   BMI (kg/m^2) 39.5   Fat Mass (lbs) 142.0 lb   Fat Free Mass (lbs) 125.5 lb    Total Body Water (lbs) 92.0 lb   Did not weigh on Tanita Scale today - 04/01/15.  Estimated energy needs: 1600 calories 180 g carbohydrates 120 g protein 44 g fat  Progress Towards Goal(s):  In progress.   Nutritional Diagnosis:  NI-1.5 Excessive energy intake As related to activity level.  As evidenced by BMI of 40.2, now down to 39.5.    Intervention:  Nutrition counseling continued. Reviewed insulin action today and the probability that her Lantus dose may be too strong based on hypoglycemia after meal time insulin action is completed. (pre-lunch). I have discussed the V-Go with her previously but it appears she would benefit from being able to customize her basal insulin so I brought up the insulin pump with her today. I feel she is a good candidate since she is on more than 3 injections per day, she has frequent hypoglycemia and she is capable of carb counting. Her insurance coverage would cover at 100% if she chooses Medtr[employer] or [employer]. She is willing to discuss with Dr. Chalmers Cater.   Plan:  Continue to aim for 3 Carb Choices per meal (45 grams) +/- 1 either way  Continue to aim for 0-2 Carbs per snack  if hungry  Continue Include protein in moderation with your meals and snacks Continue reading food labels for Total Carbohydrate of foods Use 15 grams pure carbohydrate without added fat to treat low BGs Keep treatments for low BG at work for emergencies and the Glucose Tabs in your purse Continue checking BG at alternate times per day as directed by MD  Continue using Carb Ratio of 3 units per Carb choice  Consider decreasing your Lantus back to 45 units now that you are back at work and having low's before each meal. Ask Dr. Chalmers Cater her opinion about pump therapy for you with your variability of BG's each day Check with Link to Wellness about coverage of pumps.      Teaching Method Utilized: Visual, Auditory and Hands on  Handouts given during visit include: Intro to Pumping  handout Insulin Action handout  Barriers to learning/adherence to lifestyle change: recent foot surgery has limited any activity  Diabetes self-care support plan:   [facility] support group  Co-workers and friends   Demonstrated degree of understanding via:  Teach Back   Monitoring/Evaluation:  Dietary intake, exercise, SMBG, prevention of hypoglycemia and body weight in 3 months.

## 2015-04-02 ENCOUNTER — Encounter: Payer: Self-pay | Admitting: Podiatry

## 2015-04-02 ENCOUNTER — Ambulatory Visit (INDEPENDENT_AMBULATORY_CARE_PROVIDER_SITE_OTHER): Payer: 59 | Admitting: Podiatry

## 2015-04-02 VITALS — BP 151/93 | HR 76 | Resp 14

## 2015-04-02 DIAGNOSIS — Z9889 Other specified postprocedural states: Secondary | ICD-10-CM

## 2015-04-03 ENCOUNTER — Ambulatory Visit (HOSPITAL_COMMUNITY): Payer: 59

## 2015-04-04 NOTE — Progress Notes (Signed)
She presents today to months status post" disarticulation second metatarsophalangeal joint left foot. She states this still stay swollen and tender.  Objective: Vital signs are stable she is alert and oriented 3 she has mild tenderness on palpation of the scar otherwise plantarly she's doing quite well. There is no ulceration or open skin.  Assessment: Healing surgical foot left.  Plan: Follow-up with her in 2 months. I encouraged skin manipulation of the surgical site as well as contrast baths.

## 2015-04-17 ENCOUNTER — Ambulatory Visit (HOSPITAL_COMMUNITY): Admission: RE | Admit: 2015-04-17 | Payer: 59 | Source: Ambulatory Visit

## 2015-05-05 ENCOUNTER — Telehealth: Payer: Self-pay | Admitting: *Deleted

## 2015-05-05 NOTE — Telephone Encounter (Signed)
Amy Adams states the Physician's Statement says pt was to return to work 03/02/2015, but pt states she returned to work 03/09/2015.  Amy Adams states he needs clarification and can leave message on his certified private line.

## 2015-05-14 ENCOUNTER — Ambulatory Visit (INDEPENDENT_AMBULATORY_CARE_PROVIDER_SITE_OTHER): Payer: 59

## 2015-05-14 ENCOUNTER — Encounter: Payer: Self-pay | Admitting: Podiatry

## 2015-05-14 ENCOUNTER — Ambulatory Visit (INDEPENDENT_AMBULATORY_CARE_PROVIDER_SITE_OTHER): Payer: 59 | Admitting: Podiatry

## 2015-05-14 VITALS — BP 135/88 | HR 78 | Resp 16

## 2015-05-14 DIAGNOSIS — S93402A Sprain of unspecified ligament of left ankle, initial encounter: Secondary | ICD-10-CM | POA: Diagnosis not present

## 2015-05-14 DIAGNOSIS — S92002A Unspecified fracture of left calcaneus, initial encounter for closed fracture: Secondary | ICD-10-CM | POA: Diagnosis not present

## 2015-05-14 DIAGNOSIS — S92212A Displaced fracture of cuboid bone of left foot, initial encounter for closed fracture: Secondary | ICD-10-CM

## 2015-05-17 NOTE — Progress Notes (Signed)
She presents to the office today with chief complaint of a painful ankle and lateral foot left. States that she has just recently fallen at a [employer] at the handicap ramp injuring the restaurant. Falling on her hands and knees bruising and abrading both and twisting her ankle for which she can hardly put to the floor she says. 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 She states that this is healing well and feeling much better.  Objective: Vital signs are stable she is alert and oriented 3 pulses are palpable bilateral. Neurologic sensorium is intact. Bruises and abrasions to the hands knees and left foot demonstrating no signs of infection but tenderness overlying the lateral ankle at the anterior talofibular ligament as well as the calcaneocuboid articulation and peroneal area. Radiographs confirm what appears to be a small compression fracture at the calcaneal cuboid joint inferiorly. I see no other fractures.  Assessment: Sprain left ankle. Non-fragmented non-comminuted non-dislocated fracture calcaneocuboid joint left.  Plan: Encouraged her to utilize her cam walker for the next month and I will follow-up with her at that time.

## 2015-05-20 ENCOUNTER — Other Ambulatory Visit (HOSPITAL_COMMUNITY): Payer: Self-pay | Admitting: Obstetrics and Gynecology

## 2015-05-20 DIAGNOSIS — Z1231 Encounter for screening mammogram for malignant neoplasm of breast: Secondary | ICD-10-CM

## 2015-05-21 ENCOUNTER — Ambulatory Visit: Payer: 59

## 2015-05-28 ENCOUNTER — Ambulatory Visit (HOSPITAL_COMMUNITY): Payer: 59 | Attending: Obstetrics and Gynecology

## 2015-05-29 ENCOUNTER — Ambulatory Visit (INDEPENDENT_AMBULATORY_CARE_PROVIDER_SITE_OTHER): Payer: 59 | Admitting: Podiatry

## 2015-05-29 ENCOUNTER — Ambulatory Visit (INDEPENDENT_AMBULATORY_CARE_PROVIDER_SITE_OTHER): Payer: 59

## 2015-05-29 ENCOUNTER — Encounter: Payer: Self-pay | Admitting: Podiatry

## 2015-05-29 VITALS — BP 141/90 | HR 86 | Resp 12

## 2015-05-29 DIAGNOSIS — S93402D Sprain of unspecified ligament of left ankle, subsequent encounter: Secondary | ICD-10-CM

## 2015-05-29 DIAGNOSIS — S92002D Unspecified fracture of left calcaneus, subsequent encounter for fracture with routine healing: Secondary | ICD-10-CM | POA: Diagnosis not present

## 2015-05-29 DIAGNOSIS — Z9889 Other specified postprocedural states: Secondary | ICD-10-CM | POA: Diagnosis not present

## 2015-06-01 NOTE — Progress Notes (Signed)
Patient ID: Amy Adams, female   DOB: [birth year], 49 y.o.   MRN: [id number]  Subjective: Patient was infiltrated palpable evaluation sprained left ankle as well as fracture of the calcaneocuboid joint left foot. She is remaining in the [employer] since last appointment. She states that overall she was doing better and the pain is controlled. Denies any acute injury or trauma since last appointment. Denies any systemic complaints as fevers, chills, nausea, vomiting. Denies any calf pain, chest pain, shortness of breath.  Objective: AAO 3, NAD Neurovascular status unchanged There is tenderness to palpation overlying the left ATFL. There is no tenderness along the CFL or PTFL. There is no tenderness along the deltoid ligaments or syndesmosis. No pinpoint bony tenderness over the fibula, tibia. Ankle joint range of motion is intact and without restriction. There is discomfort along the lateral aspect of the calcaneocuboid joint. Subjectively she states that the pain is decreased. There is trace edema overlying the lateral aspect of the foot and ankle. No overlying erythema or increased warmth. No other areas of tenderness to bilateral lower extremity is. No open lesions or pre-ulcerative lesions. 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 No pain with calf compression, swelling, warmth, erythema.  Assessment: 49 year old female left ankle sprain, healing fracture left calcaneal cuboid joint.  Plan: -X-rays were obtained and reviewed with the patient.  -Treatment options discussed including all alternatives, risks, and complications -Recommend continue mobilization the Cam Walker given the continued pain. Continue ice the area. -Follow-up in 2 weeks with Dr. Milinda Pointer or sooner if any problems are to arise. In the meantime I encouraged her to call the office if any questions, concerns, change in symptoms.  Celesta Gentile, DPM

## 2015-06-09 ENCOUNTER — Ambulatory Visit (INDEPENDENT_AMBULATORY_CARE_PROVIDER_SITE_OTHER): Payer: 59 | Admitting: Family Medicine

## 2015-06-09 ENCOUNTER — Ambulatory Visit: Payer: 59

## 2015-06-09 VITALS — BP 143/83 | HR 85 | Wt 261.0 lb

## 2015-06-09 DIAGNOSIS — E119 Type 2 diabetes mellitus without complications: Secondary | ICD-10-CM

## 2015-06-09 NOTE — Progress Notes (Signed)
Patient presents for 3 mo f/u DM as part of the employee sponsored link to wellness program. Medications have been reviewed. I have discussed lifestyle interventions such as diet and exercise.   Diabetes- A1C 6.4 last month at dr. Almetta Lovely, at goal. Patient hasn't tested blood sugar in 2 weeks but was having lows around 11 am. She was giving too much insulin before breakfast and dr. Chalmers Cater has decreased her morning humalog. However, patient hasn't been testing to see if that worked even though she feels better. Counseled on importance of blood sugar checks especially with insulin dose changes. Patient agreed to start testing 4x daily again (fasting and 2 hours post prandial). Reviewed fasting and post prandial targets. Patient will call me if blood sugars are out of range and will contact Dr. Chalmers Cater. Hypertension-slightly elevated today. Patient will monitor at home. Counseled on goal blood pressure of <140/90 Lipids-on appropriately dosed statin.   Patient has set a series of personal goals and will f/u in 3 mo for further review of DM

## 2015-06-09 NOTE — Patient Instructions (Signed)
Begin testing blood sugar again 4 times daily (post prandial and fasting) so you can take log to dr balan.

## 2015-06-11 ENCOUNTER — Ambulatory Visit (INDEPENDENT_AMBULATORY_CARE_PROVIDER_SITE_OTHER): Payer: 59 | Admitting: Podiatry

## 2015-06-11 ENCOUNTER — Ambulatory Visit (INDEPENDENT_AMBULATORY_CARE_PROVIDER_SITE_OTHER): Payer: 59

## 2015-06-11 ENCOUNTER — Encounter: Payer: Self-pay | Admitting: Podiatry

## 2015-06-11 VITALS — BP 148/83 | HR 88 | Resp 15

## 2015-06-11 DIAGNOSIS — M7672 Peroneal tendinitis, left leg: Secondary | ICD-10-CM

## 2015-06-11 DIAGNOSIS — S92212D Displaced fracture of cuboid bone of left foot, subsequent encounter for fracture with routine healing: Secondary | ICD-10-CM | POA: Diagnosis not present

## 2015-06-13 NOTE — Progress Notes (Signed)
She presents today for follow-up of a fracture at the calcaneocuboid articulation. She states that her foot feels a lot better however he still has a lot of pain to the lateral left ankle. States that her right foot is doing very well in the amputation site to the left foot has healed 100% and feels wonderful. She denies fever chills nausea vomiting muscle aches and pains she is also concerned about the left lateral ankle which is still limiting her daily activities.  Objective: Vital signs are stable she's alert and oriented 3 pulses are palpable left. At this point she still has edema overlying the fibular malleolus. Radiographs do not demonstrate any type of osseus abnormality in this area. Her fracture at the calcaneocuboid joint appears to be healing nicely. She has pain on palpation of the peroneal tendon left. The majority of the pain is focal directly inferior to the lateral malleolus.  Assessment: Well-healing fracture calcaneocuboid joint left. Well healing surgical foot status post amputation second toe left. Peroneal tendinitis left ankle currently.  Plan: At this point I injected Kenalog and local anesthesia within the peroneal tendon sheath. She is to ice this and keep it elevated. She is to use her ankle stabilizer as much as possible. And I will follow-up with her in about 3 weeks.

## 2015-06-15 NOTE — Progress Notes (Signed)
Patient ID: Amy Adams, female   DOB: 1966-09-30, 49 y.o.   MRN: [id number] ATTENDING PHYSICIAN NOTE: I have reviewed the chart and agree with the plan as detailed above. Dorcas Mcmurray MD Pager [phone]

## 2015-06-18 ENCOUNTER — Ambulatory Visit (HOSPITAL_COMMUNITY)
Admission: RE | Admit: 2015-06-18 | Discharge: 2015-06-18 | Disposition: A | Discharge status: Home / Self Care | Payer: 59 | Source: Ambulatory Visit | Attending: Obstetrics and Gynecology | Admitting: Obstetrics and Gynecology

## 2015-06-18 DIAGNOSIS — Z1231 Encounter for screening mammogram for malignant neoplasm of breast: Secondary | ICD-10-CM | POA: Diagnosis present

## 2015-07-02 ENCOUNTER — Encounter: Payer: Self-pay | Admitting: Podiatry

## 2015-07-02 ENCOUNTER — Ambulatory Visit: Payer: 59 | Admitting: *Deleted

## 2015-07-02 ENCOUNTER — Ambulatory Visit (INDEPENDENT_AMBULATORY_CARE_PROVIDER_SITE_OTHER): Payer: 59 | Admitting: Podiatry

## 2015-07-02 ENCOUNTER — Ambulatory Visit (INDEPENDENT_AMBULATORY_CARE_PROVIDER_SITE_OTHER): Payer: 59

## 2015-07-02 ENCOUNTER — Telehealth: Payer: Self-pay | Admitting: *Deleted

## 2015-07-02 VITALS — BP 143/92 | HR 92 | Resp 12

## 2015-07-02 DIAGNOSIS — S92212D Displaced fracture of cuboid bone of left foot, subsequent encounter for fracture with routine healing: Secondary | ICD-10-CM

## 2015-07-02 DIAGNOSIS — S86112D Strain of other muscle(s) and tendon(s) of posterior muscle group at lower leg level, left leg, subsequent encounter: Secondary | ICD-10-CM | POA: Diagnosis not present

## 2015-07-02 NOTE — Progress Notes (Signed)
She presents in follow-up of her ankle injury where she fell at a local restaurant proximally 6 weeks ago. She's been in a [employer] since that time. She states that the ankle still hurts terribly she cannot walk on it without the Cam Walker and stay swollen. She has developed radiating pain to the lateral aspect of her leg and foot she states that the distal aspect of the foot is feeling fine.  Objective: Vital signs are stable she is alert and oriented 3. Pulses are palpable. Neurologic sensorium is intact her Semmes-Weinstein monofilament and deep tendon reflexes are intact. Muscle strength +5 over 5 dorsiflexion plantar flexors and inverters everters all into the musculature appears to be intact. Her peroneal brevis appears to be painful possible fluctuance within the tendon sheath. Indicate a tear or chronic tendinitis. Radiographs taken today 3 views of the ankle demonstrate only what appears to be a possible fracture of the distal malleolus with soft tissue edema distal to that. Cutaneous evaluation is normal without complications.  Assessment: Peroneal tendon tear left ankle. Lateral ankle instability possible ATF L tear.  Plan: At this point I encouraged her to continue the use of the Cam Walker until an MRI of the left ankle can be obtained. We will order that today and I will follow-up with her for a consult once the results come in.  Roselind Messier DPM

## 2015-07-02 NOTE — Telephone Encounter (Signed)
Prior authorization not need for [facility] [phone], and orders faxed to [facility] [phone].

## 2015-07-03 ENCOUNTER — Ambulatory Visit: Payer: 59 | Admitting: *Deleted

## 2015-07-10 ENCOUNTER — Inpatient Hospital Stay: Admission: RE | Admit: 2015-07-10 | Payer: 59 | Source: Ambulatory Visit

## 2015-07-22 ENCOUNTER — Other Ambulatory Visit: Payer: Self-pay | Admitting: Obstetrics and Gynecology

## 2015-07-23 LAB — CYTOLOGY - PAP

## 2015-07-27 ENCOUNTER — Ambulatory Visit
Admission: RE | Admit: 2015-07-27 | Discharge: 2015-07-27 | Disposition: A | Discharge status: Home / Self Care | Payer: 59 | Source: Ambulatory Visit | Attending: Podiatry | Admitting: Podiatry

## 2015-07-27 DIAGNOSIS — S86112D Strain of other muscle(s) and tendon(s) of posterior muscle group at lower leg level, left leg, subsequent encounter: Secondary | ICD-10-CM

## 2015-07-28 ENCOUNTER — Telehealth: Payer: Self-pay | Admitting: *Deleted

## 2015-07-28 ENCOUNTER — Encounter: Payer: Self-pay | Admitting: Podiatry

## 2015-07-28 ENCOUNTER — Ambulatory Visit (INDEPENDENT_AMBULATORY_CARE_PROVIDER_SITE_OTHER): Payer: 59 | Admitting: Podiatry

## 2015-07-28 ENCOUNTER — Ambulatory Visit (INDEPENDENT_AMBULATORY_CARE_PROVIDER_SITE_OTHER): Payer: 59

## 2015-07-28 VITALS — BP 173/99 | HR 83 | Resp 16

## 2015-07-28 DIAGNOSIS — S92912A Unspecified fracture of left toe(s), initial encounter for closed fracture: Secondary | ICD-10-CM

## 2015-07-28 DIAGNOSIS — S99922A Unspecified injury of left foot, initial encounter: Secondary | ICD-10-CM

## 2015-07-28 NOTE — Telephone Encounter (Addendum)
Faxed order for copy of left ankle MRI disc of 07/27/2015.  MRI copy disc sent to [facility].

## 2015-07-28 NOTE — Telephone Encounter (Signed)
Pt states she feels like the 3rd toe next to her amputated toe is dislocated.  I spoke with Dr. Stephenie Acres assistant she states have pt come in at 0945am today.  Priscille Loveless was to call pt and schedule to come in as soon as possible.

## 2015-07-28 NOTE — Telephone Encounter (Signed)
I contacted pt and told her we could see her today before 1200pm, because we would not have a doctor in the office this afternoon.  Pt states she is on her way.

## 2015-07-28 NOTE — Progress Notes (Signed)
She presents today after kicking her foot on a box. She states that her third toe left foot is extremely painful.  Objective: Vital signs are stable she is alert and oriented 3. Mild edema to the third digit of the left foot. Radiographs demonstrated a nondisplaced fracture third digit left foot.  Assessment: Fractured third toe left foot.  Plan: Continue to wear the Darco shoe and take the toe daily will follow up with her once her MRI is complete for her peroneal tendons.

## 2015-08-13 ENCOUNTER — Encounter: Payer: Self-pay | Admitting: Podiatry

## 2015-08-13 ENCOUNTER — Ambulatory Visit (INDEPENDENT_AMBULATORY_CARE_PROVIDER_SITE_OTHER): Payer: 59 | Admitting: Podiatry

## 2015-08-13 VITALS — BP 151/99 | HR 92 | Resp 12

## 2015-08-13 DIAGNOSIS — M76822 Posterior tibial tendinitis, left leg: Secondary | ICD-10-CM

## 2015-08-13 DIAGNOSIS — M7672 Peroneal tendinitis, left leg: Secondary | ICD-10-CM

## 2015-08-13 DIAGNOSIS — M7670 Peroneal tendinitis, unspecified leg: Secondary | ICD-10-CM | POA: Diagnosis not present

## 2015-08-13 NOTE — Progress Notes (Signed)
She presents today for follow-up of her MRI report in stating that she has pain to her peroneal tendons. She continues to wear the Cam Walker.  Objective: vital signs are stable she is alert and oriented 3. Pulses are strong and palpable. She palpation of the peroneal tendons which according to the MRI demonstrate only a mild tendinitis. She also has tenderness on palpation of the posterior tibial tendon at the navicular tuberosity consistent with a small split tear within the tendon itself. Minimal edema noted overlying erythema cellulitis drainage or odor.    assessment: peroneal tendinitis left. An intrasubstance tear of the distal aspect of the posterior tibial tendon left.  Plan: injected the peroneal tendon area today with Kenalog and local and aesthetic I will follow-up with her in the next few weeks it which time we will schedule surgical intervention for the posterior tibial tendon.

## 2015-08-18 ENCOUNTER — Telehealth: Payer: Self-pay | Admitting: *Deleted

## 2015-08-18 NOTE — Telephone Encounter (Signed)
"  I saw Dr. Milinda Pointer on Thursday, we were talking about having surgery on my ankle for some tendons and ligaments.  I would like to know when he can set this up to be done so I can check with my benefits.  It's going to take a little while to get things set up.  Call me back on my cell phone."  I'm returning your call.  "I'd like to schedule my surgery.  My boss said she needs to know so she can start making arrangements for coverage while I'm out especially since it's going to be around the holidays.  What does he have available starting in November?"  I can put you down tentatively for November 11th.  You will need to see him for a consult.  Then we can get you scheduled.  "Okay, that will be great, at least I can tell her something."

## 2015-08-28 ENCOUNTER — Telehealth: Payer: Self-pay | Admitting: *Deleted

## 2015-08-28 NOTE — Telephone Encounter (Signed)
Pt states she stepped down yesterday and felt snap in her ankle and now has some numbness, knows Dr. Milinda Pointer isn't in now, but would like to see him on 09/01/2015.  I called pt and told her to put the boot back on, which she said she did, and I offered her an appt with Dr. Paulla Dolly today.  Pt states she would prefer to wait for Dr. Milinda Pointer, because he knows her hx, and she set up surgery for 09/25/2015.  I told pt if symptoms worsen to go to ER or call to see one of our doctors sooner.

## 2015-09-01 ENCOUNTER — Ambulatory Visit (INDEPENDENT_AMBULATORY_CARE_PROVIDER_SITE_OTHER): Payer: 59 | Admitting: Podiatry

## 2015-09-01 ENCOUNTER — Encounter: Payer: Self-pay | Admitting: Podiatry

## 2015-09-01 DIAGNOSIS — M7670 Peroneal tendinitis, unspecified leg: Secondary | ICD-10-CM | POA: Diagnosis not present

## 2015-09-01 DIAGNOSIS — M76822 Posterior tibial tendinitis, left leg: Secondary | ICD-10-CM

## 2015-09-01 DIAGNOSIS — M7672 Peroneal tendinitis, left leg: Secondary | ICD-10-CM

## 2015-09-01 MED ORDER — OXYCODONE-ACETAMINOPHEN 5-325 MG PO TABS: 1.0000 | ORAL_TABLET | Freq: Three times a day (TID) | ORAL | Status: DC | PRN

## 2015-09-01 NOTE — Progress Notes (Signed)
She presents today for surgical consult regarding her left foot. She states this foot is just killing me and I cannot get any of my work done. She states this is becoming life limiting and is not allowing her to exercise to help maintain her blood sugar. She states that her sugars are starting to spiral out of control.  Objective: Vital signs are stable she is alert and oriented 3. Reviewed her old MRI. Evaluated her today strong palpable pulses left lower extremity. She has pain on palpation of the peroneal tendons and of the posterior tibial tendon left foot. Moderate fluid retention in these tendon sheaths.  Assessment: Tear of the posterior tibial tendon and of the peroneal tendons.  Plan: We scheduled her today for surgical repair of these tendons coming up in November and December. At this point we went over a consent form today By line number by number given her ample time to ask questions she saw. Regarding a peroneal tendon repair as well as a posterior tibial tendon repair. I answered all these questions to best of my ability in layman's terms she understood it was amenable to it and signed off replace of the consent form. We also dispensed a prescription for a knee scooter. I also prescribed her Percocet for her pain at this point.

## 2015-09-01 NOTE — Patient Instructions (Signed)
Pre-Operative Instructions  Congratulations, you have decided to take an important step to improving your quality of life.  You can be assured that the doctors of [facility] will be with you every step of the way.  1. Plan to be at the surgery center/hospital at least 1 (one) hour prior to your scheduled time unless otherwise directed by the surgical center/hospital staff.  You must have a responsible adult accompany you, remain during the surgery and drive you home.  Make sure you have directions to the surgical center/hospital and know how to get there on time. 2. For hospital based surgery you will need to obtain a history and physical form from your family physician within 1 month prior to the date of surgery- we will give you a form for you primary physician.  3. We make every effort to accommodate the date you request for surgery.  There are however, times where surgery dates or times have to be moved.  We will contact you as soon as possible if a change in schedule is required.   4. No Aspirin/Ibuprofen for one week before surgery.  If you are on aspirin, any non-steroidal anti-inflammatory medications (Mobic, Aleve, Ibuprofen) you should stop taking it 7 days prior to your surgery.  You make take Tylenol  For pain prior to surgery.  5. Medications- If you are taking daily heart and blood pressure medications, seizure, reflux, allergy, asthma, anxiety, pain or diabetes medications, make sure the surgery center/hospital is aware before the day of surgery so they may notify you which medications to take or avoid the day of surgery. 6. No food or drink after midnight the night before surgery unless directed otherwise by surgical center/hospital staff. 7. No alcoholic beverages 24 hours prior to surgery.  No smoking 24 hours prior to or 24 hours after surgery. 8. Wear loose pants or shorts- loose enough to fit over bandages, boots, and casts. 9. No slip on shoes, sneakers are best. 10. Bring  your boot with you to the surgery center/hospital.  Also bring crutches or a walker if your physician has prescribed it for you.  If you do not have this equipment, it will be provided for you after surgery. 11. If you have not been contracted by the surgery center/hospital by the day before your surgery, call to confirm the date and time of your surgery. 12. Leave-time from work may vary depending on the type of surgery you have.  Appropriate arrangements should be made prior to surgery with your employer. 13. Prescriptions will be provided immediately following surgery by your doctor.  Have these filled as soon as possible after surgery and take the medication as directed. 14. Remove nail polish on the operative foot. 15. Wash the night before surgery.  The night before surgery wash the foot and leg well with the antibacterial soap provided and water paying special attention to beneath the toenails and in between the toes.  Rinse thoroughly with water and dry well with a towel.  Perform this wash unless told not to do so by your physician.  Enclosed: 1 Ice pack (please put in freezer the night before surgery)   1 Hibiclens skin cleaner   Pre-op Instructions  If you have any questions regarding the instructions, do not hesitate to call our office.  [place]: [address] [place], [place] [address] [phone]  [place]: [address] [place], [place] [address] [phone]  [place]: [address]  [place], [place] [address] [phone]  Dr. Richard   Tuchman DPM, Dr. Norman Regal DPM Dr. Richard Sikora DPM, Dr. M. Todd Hyatt DPM, Dr. Kathryn Egerton DPM 

## 2015-09-08 ENCOUNTER — Other Ambulatory Visit: Payer: Self-pay | Admitting: Internal Medicine

## 2015-09-08 DIAGNOSIS — E041 Nontoxic single thyroid nodule: Secondary | ICD-10-CM

## 2015-09-11 ENCOUNTER — Encounter: Payer: 59 | Attending: Endocrinology | Admitting: *Deleted

## 2015-09-11 VITALS — Wt 260.1 lb

## 2015-09-11 DIAGNOSIS — Z713 Dietary counseling and surveillance: Secondary | ICD-10-CM | POA: Diagnosis not present

## 2015-09-11 DIAGNOSIS — E119 Type 2 diabetes mellitus without complications: Secondary | ICD-10-CM

## 2015-09-11 DIAGNOSIS — E1142 Type 2 diabetes mellitus with diabetic polyneuropathy: Secondary | ICD-10-CM | POA: Diagnosis present

## 2015-09-11 DIAGNOSIS — Z794 Long term (current) use of insulin: Secondary | ICD-10-CM | POA: Insufficient documentation

## 2015-09-11 NOTE — Patient Instructions (Signed)
Plan:  Continue to aim for 3 Carb Choices per meal (45 grams) +/- 1 either way  Continue to aim for 0-2 Carbs per snack if hungry  Continue Include protein in moderation with your meals and snacks Continue reading food labels for Total Carbohydrate of foods Keep treatments for low BG at work for emergencies and the Glucose Tabs in your purse Continue using Carb Ratio of 3 units per Carb choice  Continue adjusting your insulin as needed to manage your BG effectively Consider checking BG at bedtime to evaluate what we need to do to bring FBG down

## 2015-09-11 NOTE — Progress Notes (Signed)
Appt start time: 0900 end time:  0930.  Assessment:  Patient was seen on  09/11/15 for individual diabetes education follow up. She is worried about upcoming foot surgery and has not been able to exercise except for some stretching in a chair or leaning against a wall. She is managing her insulin doses, both Lantus and Humalog, much better based on BG patterns as well as variations in carb intake. She has had one episode of hypoglycemia in the past month, which is a major improvement. Per review of her BG meter, her FBG are running 150-200 mg/dl, but post meal BG's are within target range of 100-180 mg/dl.  Current HbA1c: not available  Preferred Learning Style: Hands on  Learning Readiness:   Ready  Change in progress  MEDICATIONS: see list, diabetes medications are Humalog, Lantus and Metformin  DIETARY INTAKE:  24-hr recall:  B ( AM): fast food biscuit with egg,and Active drink (flavored water) or diet soda  Snk ( AM): lean meat and cheese, fresh fruit around 11 AM L ( PM): left overs from home, varies, protein, smaller serving of starch, vegetables, diet soda or water Snk ( PM): pretzels OR fresh fruit OR Nutrigrain bar, no more candy D ( PM): meat, vegetable and small serving of starch,  Snk ( PM): not usually due to later supper Beverages: water, Fit and Active flavored water, 2% milk  Usual physical activity: none right now due to recent foot surgery and recent injury to this foot.   TANITA  BODY COMP RESULTS 02/12/14  Weight 269.0 lb   BMI (kg/m^2) 39.7   Fat Mass (lbs) 142.0 lb   Fat Free Mass (lbs) 127.0 lb   Total Body Water (lbs) 93.0 lb   TANITA  BODY COMP RESULTS 02/27/14  Weight 274.0 lb   BMI (kg/m^2) 40.5   Fat Mass (lbs) 142.5 lb   Fat Free Mass (lbs) 131.5 lb   Total Body Water (lbs) 96.5 lb   TANITA  BODY COMP RESULTS 03/12/14  Weight 272.5 lb   BMI (kg/m^2) 40.2   Fat Mass (lbs) 144.0 lb   Fat Free Mass (lbs) 128.5 lb   Total Body Water (lbs) 94.0 lb     TANITA  BODY COMP RESULTS 06/05/14  Weight 263.7 lb   BMI (kg/m^2) 39.5   Fat Mass (lbs) 142.0 lb   Fat Free Mass (lbs) 125.5 lb   Total Body Water (lbs) 92.0 lb   TANITA  BODY COMP RESULTS 09/11/15  Weight 262.5   BMI (kg/m^2) 38.8   Fat Mass (lbs) 140.0   Fat Free Mass (lbs) 122.5   Total Body Water (lbs) 89.5   Fat mass down another 2 pounds, Fat Free mass down 3 pounds, over 2 being total body water  Estimated energy needs: 1600 calories 180 g carbohydrates 120 g protein 44 g fat  Progress Towards Goal(s):  In progress.   Nutritional Diagnosis:  NI-1.5 Excessive energy intake As related to activity level.  As evidenced by BMI of 40.2, now down to 38.8.    Intervention:  Nutrition counseling continued. Patient not having much trouble with hypoglycemia anymore. Reviewed BG patterns and importance of eating meals at regular times as able. Encouraged arm chair exercises as able prior to upcoming foot surgery.  Plan:  Continue to aim for 3 Carb Choices per meal (45 grams) +/- 1 either way  Continue to aim for 0-2 Carbs per snack if hungry  Continue Include protein in moderation with your meals and  snacks Continue reading food labels for Total Carbohydrate of foods Keep treatments for low BG at work for emergencies and the Glucose Tabs in your purse Continue using Carb Ratio of 3 units per Carb choice  Continue adjusting your insulin as needed to manage your BG effectively Consider checking BG at bedtime to evaluate what we need to do to bring FBG down      Teaching Method Utilized: Visual, Auditory and Hands on  Handouts given during visit include:  Arm Chair exercise handout  Barriers to learning/adherence to lifestyle change: recent foot surgery has limited any activity  Diabetes self-care support plan:   [facility] support group  Co-workers and friends   Demonstrated degree of understanding via:  Teach Back   Monitoring/Evaluation:  Dietary intake, exercise,  SMBG, prevention of hypoglycemia and body weight in 3 months.

## 2015-09-15 HISTORY — PX: TENDON REPAIR: SHX5111

## 2015-09-17 ENCOUNTER — Ambulatory Visit: Payer: 59 | Admitting: Podiatry

## 2015-09-21 ENCOUNTER — Ambulatory Visit: Payer: Self-pay

## 2015-09-21 NOTE — Progress Notes (Unsigned)
Subjective:  Patient presents today for 3 month diabetes follow-up as part of the employer-sponsored Link to Wellness program.  Current diabetes regimen includes humalog, lantus, and metformin. Patient also continues on daily ASA, ARB and statin.  Most recent MD follow-up was august. patient scheduled for ankle surgery Friday.    Assessment:  Diabetes: Most recent A1C was 6.8% today which is at goal of less than 7%. Weight is stable  from last visit with me.   Lifestyle improvements: patient trying to reduce carb intake  Physical Activity- unable at this time due to ankle   Follow up with me in 3 months.    Plan/Goals for Next Visit:  1. A1C at goal, no recommended medication changes at this time; however, she is scheduled for ankle surgery on Friday and will be mostly immobile for 12 weeks. Counseled to heavily monitor blood sugar and carb intake and to call Dr. Chalmers Cater if needed for insulin adjustments.    Next appointment to see me is: February 2017

## 2015-09-22 ENCOUNTER — Encounter: Payer: Self-pay | Admitting: *Deleted

## 2015-09-23 ENCOUNTER — Encounter (INDEPENDENT_AMBULATORY_CARE_PROVIDER_SITE_OTHER): Payer: Self-pay

## 2015-09-23 ENCOUNTER — Encounter: Payer: 59 | Attending: Endocrinology | Admitting: *Deleted

## 2015-09-23 VITALS — Ht 69.0 in | Wt 262.5 lb

## 2015-09-23 DIAGNOSIS — Z794 Long term (current) use of insulin: Secondary | ICD-10-CM | POA: Insufficient documentation

## 2015-09-23 DIAGNOSIS — E1142 Type 2 diabetes mellitus with diabetic polyneuropathy: Secondary | ICD-10-CM | POA: Insufficient documentation

## 2015-09-23 DIAGNOSIS — Z713 Dietary counseling and surveillance: Secondary | ICD-10-CM | POA: Insufficient documentation

## 2015-09-23 NOTE — Patient Instructions (Signed)
Plan: Due to the low BG after breakfast without any fast acting insulin for that meal, consider decreasing AM Lantus from 35 to 30 units for now Continue taking 3 units for every Carb Choice (= 1 unit for every 5 grams) Consider using fast acting carbohydrate first when treating a low BG, then follow with starch and protein  We reviewed the Snack Handout with lists of carbohydrate and protein foods

## 2015-09-24 ENCOUNTER — Ambulatory Visit
Admission: RE | Admit: 2015-09-24 | Discharge: 2015-09-24 | Disposition: A | Discharge status: Home / Self Care | Payer: 59 | Source: Ambulatory Visit | Attending: Internal Medicine | Admitting: Internal Medicine

## 2015-09-24 ENCOUNTER — Other Ambulatory Visit (HOSPITAL_COMMUNITY)
Admission: RE | Admit: 2015-09-24 | Discharge: 2015-09-24 | Disposition: A | Discharge status: Home / Self Care | Payer: 59 | Source: Ambulatory Visit | Attending: Radiology | Admitting: Radiology

## 2015-09-24 ENCOUNTER — Other Ambulatory Visit: Payer: Self-pay | Admitting: Podiatry

## 2015-09-24 DIAGNOSIS — E041 Nontoxic single thyroid nodule: Secondary | ICD-10-CM

## 2015-09-24 DIAGNOSIS — E042 Nontoxic multinodular goiter: Secondary | ICD-10-CM | POA: Insufficient documentation

## 2015-09-24 MED ORDER — PROMETHAZINE HCL 25 MG PO TABS: 25.0000 mg | ORAL_TABLET | Freq: Three times a day (TID) | ORAL | Status: DC | PRN

## 2015-09-24 MED ORDER — OXYCODONE-ACETAMINOPHEN 10-325 MG PO TABS: ORAL_TABLET | ORAL | Status: DC

## 2015-09-24 MED ORDER — CEPHALEXIN 500 MG PO CAPS: 500.0000 mg | ORAL_CAPSULE | Freq: Three times a day (TID) | ORAL | Status: DC

## 2015-09-25 ENCOUNTER — Telehealth: Payer: Self-pay | Admitting: *Deleted

## 2015-09-25 ENCOUNTER — Encounter: Payer: Self-pay | Admitting: Podiatry

## 2015-09-25 DIAGNOSIS — M7670 Peroneal tendinitis, unspecified leg: Secondary | ICD-10-CM | POA: Diagnosis not present

## 2015-09-25 DIAGNOSIS — M76822 Posterior tibial tendinitis, left leg: Secondary | ICD-10-CM | POA: Diagnosis not present

## 2015-09-25 NOTE — Progress Notes (Signed)
Appt start time: 1400 end time:  1430.  Assessment:  Patient was seen on  09/23/15 for individual diabetes education follow up. She wants to review her BG Logs to get in as good control as possible prior to her surgery later this week. She continues to adjust her fast acting Humalog insulin appropriately with her meals. She continues to have less hypoglycemia than previously. She also wants to discuss meals that her husband can cook for her while she will be on bedrest.  Current HbA1c: not available  Preferred Learning Style: Hands on  Learning Readiness:   Ready  Change in progress  MEDICATIONS: see list, diabetes medications are Humalog, Lantus and Metformin  DIETARY INTAKE:  24-hr recall:  B ( AM): fast food biscuit with egg,and Active drink (flavored water) or diet soda  Snk ( AM): lean meat and cheese, fresh fruit around 11 AM L ( PM): left overs from home, varies, protein, smaller serving of starch, vegetables, diet soda or water Snk ( PM): pretzels OR fresh fruit OR Nutrigrain bar, no more candy D ( PM): meat, vegetable and small serving of starch,  Snk ( PM): not usually due to later supper Beverages: water, Fit and Active flavored water, 2% milk  Usual physical activity: none right now due to recent foot surgery and recent injury to this foot.   TANITA  BODY COMP RESULTS 02/12/14  Weight 269.0 lb   BMI (kg/m^2) 39.7   Fat Mass (lbs) 142.0 lb   Fat Free Mass (lbs) 127.0 lb   Total Body Water (lbs) 93.0 lb   TANITA  BODY COMP RESULTS 02/27/14  Weight 274.0 lb   BMI (kg/m^2) 40.5   Fat Mass (lbs) 142.5 lb   Fat Free Mass (lbs) 131.5 lb   Total Body Water (lbs) 96.5 lb   TANITA  BODY COMP RESULTS 03/12/14  Weight 272.5 lb   BMI (kg/m^2) 40.2   Fat Mass (lbs) 144.0 lb   Fat Free Mass (lbs) 128.5 lb   Total Body Water (lbs) 94.0 lb    TANITA  BODY COMP RESULTS 06/05/14  Weight 263.7 lb   BMI (kg/m^2) 39.5   Fat Mass (lbs) 142.0 lb   Fat Free Mass (lbs) 125.5 lb   Total Body Water (lbs) 92.0 lb   TANITA  BODY COMP RESULTS 09/11/15  Weight 262.5   BMI (kg/m^2) 38.8   Fat Mass (lbs) 140.0   Fat Free Mass (lbs) 122.5   Total Body Water (lbs) 89.5   Estimated energy needs: 1600 calories 180 g carbohydrates 120 g protein 44 g fat  Progress Towards Goal(s):  In progress.   Nutritional Diagnosis:  NI-1.5 Excessive energy intake As related to activity level.  As evidenced by BMI of 40.2, now down to 38.8.    Intervention:  Nutrition counseling continued. Per review of her Log Sheets, her BG's drop in the late AM so we discussed decreasing her AM Lantus dose from 35 to 30 units. Provided her with list of snack foods including carbohydrate and protein choices. Encouraged arm chair exercises as able prior to upcoming foot surgery.  Plan: Due to the low BG after breakfast without any fast acting insulin for that meal, consider decreasing AM Lantus from 35 to 30 units for now Continue taking 3 units for every Carb Choice (= 1 unit for every 5 grams) Consider using fast acting carbohydrate first when treating a low BG, then follow with starch and protein  We reviewed the Snack Handout with lists  of carbohydrate and protein foods     Teaching Method Utilized: Visual, Auditory and Hands on  Handouts given during visit include:  Snack Handout  Yellow Menu Card  Barriers to learning/adherence to lifestyle change: recent foot surgery has limited any activity  Diabetes self-care support plan:   [facility] support group  Co-workers and friends   Demonstrated degree of understanding via:  Teach Back   Monitoring/Evaluation:  Dietary intake, exercise, SMBG, prevention of hypoglycemia and body weight PRN

## 2015-09-25 NOTE — Telephone Encounter (Signed)
"  I would like to know if patient had surgery today.  We need the name of the procedure, cpt code and the diagnosis.  I'll be sending over request for medicall information to be faxed to [phone].  Case number is [id number] (?)

## 2015-09-28 ENCOUNTER — Telehealth: Payer: Self-pay | Admitting: *Deleted

## 2015-09-28 NOTE — Telephone Encounter (Signed)
"  Amy Adams was scheduled for surgery on Friday.  Calling to see if she indeed had that surgery and find out how long she will out of work or time frame.  When is her next appointment?  Give me a call back.

## 2015-10-02 ENCOUNTER — Ambulatory Visit: Payer: 59

## 2015-10-02 ENCOUNTER — Ambulatory Visit (INDEPENDENT_AMBULATORY_CARE_PROVIDER_SITE_OTHER): Payer: 59 | Admitting: Podiatry

## 2015-10-02 VITALS — BP 152/93 | HR 74 | Temp 97.6°F | Resp 16

## 2015-10-02 DIAGNOSIS — M7672 Peroneal tendinitis, left leg: Secondary | ICD-10-CM

## 2015-10-02 DIAGNOSIS — M7670 Peroneal tendinitis, unspecified leg: Secondary | ICD-10-CM

## 2015-10-02 DIAGNOSIS — M76822 Posterior tibial tendinitis, left leg: Secondary | ICD-10-CM

## 2015-10-02 DIAGNOSIS — Z9889 Other specified postprocedural states: Secondary | ICD-10-CM

## 2015-10-02 MED ORDER — IBUPROFEN 800 MG PO TABS: 800.0000 mg | ORAL_TABLET | Freq: Three times a day (TID) | ORAL | Status: DC | PRN

## 2015-10-04 NOTE — Progress Notes (Signed)
Patient ID: Amy Adams, female   DOB: [birth year], 49 y.o.   MRN: [id number]  Subjective: Amy Adams is a 49 y.o. is seen today in office s/p left posterior tibial and peroneal tenon repair  preformed on 09/25/15 with Dr. Milinda Pointer. She states that her pain is overall control. She does get some pain in this area swells when she is on her foot. She has remained nonweightbearing as much as possible. She'll continue with antibiotics. Denies any systemic complaints such as fevers, chills, nausea, vomiting. No calf pain, chest pain, shortness of breath.   Objective: No acute distress, AAO x 3  Cast is clean, dry, intact and appears to be fitting well. She states the cast is feeling fine there is no areas of rubbing or skin irritation that she can feel.  Capillary refill time is intact to all the digits. Motor function and sensory function intact all the digits. There does not appear to be any pain with calf compression, swelling, warmth, erythema.   Assessment and Plan:  Status post left ankle tendon repairs, doing well with no complications   -Treatment options discussed including all alternatives, risks, and complications -Offer to remove the cast however since we will take it off next week for suture/staple removal likely she states the cast can just be change next appointment as is it is fitting well and not cause any problems. -Ice/elevation -Pain medication as needed. -Finish course of antibiotics. -Monitor for any clinical signs or symptoms of infection and DVT/PE and directed to call the office immediately should any occur or go to the ER. -Follow-up in 1 week for cast change and possible staple/suture removal or sooner if any problems arise. In the meantime, encouraged to call the office with any questions, concerns, change in symptoms.   Celesta Gentile, DPM

## 2015-10-05 ENCOUNTER — Encounter: Payer: Self-pay | Admitting: Podiatry

## 2015-10-05 ENCOUNTER — Ambulatory Visit (INDEPENDENT_AMBULATORY_CARE_PROVIDER_SITE_OTHER): Payer: 59 | Admitting: Podiatry

## 2015-10-05 DIAGNOSIS — M76822 Posterior tibial tendinitis, left leg: Secondary | ICD-10-CM

## 2015-10-05 DIAGNOSIS — M7672 Peroneal tendinitis, left leg: Secondary | ICD-10-CM

## 2015-10-05 DIAGNOSIS — M7670 Peroneal tendinitis, unspecified leg: Secondary | ICD-10-CM

## 2015-10-05 DIAGNOSIS — Z9889 Other specified postprocedural states: Secondary | ICD-10-CM

## 2015-10-05 NOTE — Progress Notes (Signed)
Patient ID: [name], female   DOB: August 29, 1966, 49 y.o.   MRN: [id number]  Subjective: Amy Adams is a 49 y.o. is seen today in office s/p left posterior tibial and peroneal tenon repair  preformed on 09/25/15 with Dr. Milinda Pointer. She states that her pain is overall control. She presents today requesting a cast change. She has no problems with the cast but wants to check on the incisions to make sure they are not infected. She has remained nonweightbearing as much as possible. She'll continue with antibiotics. Denies any systemic complaints such as fevers, chills, nausea, vomiting. No calf pain, chest pain, shortness of breath.   Objective: No acute distress, AAO x 3  Cast is clean, dry, intact and appears to be fitting well.   upon removal incisions are well coapted on both the medial and lateral aspect of the ankle and staples are intact. There is minimal edema around the area. There is no surrounding erythema, ascending cellulitis, fluctuance, crepitus, malodor, drainage/purulence. There is minimal tenderness palpation to the palm the surgical sites. There is no other open lesions or pre-ulcerative lesions. There is no pain with calf compression, swelling, warmth, erythema.  Assessment and Plan:  Status post left ankle tendon repairs, doing well with no complications   -Treatment options discussed including all alternatives, risks, and complications -At his appointment the cast was removed. Incisions are healing well with no signs of infection.  Antibiotic ointment was placed over the incision followed by dry sterile dressing. A well-padded below-knee fiberglass cast was then applied making sure to pad all bony prominences. -Ice/elevation -NWB -Pain medication as needed. -Finish course of antibiotics. -Monitor for any clinical signs or symptoms of infection and DVT/PE and directed to call the office immediately should any occur or go to the ER. -Follow-up in 1 week for cast change and  possible staple/suture removal or sooner if any problems arise. In the meantime, encouraged to call the office with any questions, concerns, change in symptoms.   Amy Adams, DPM

## 2015-10-07 ENCOUNTER — Telehealth: Payer: Self-pay | Admitting: *Deleted

## 2015-10-07 MED ORDER — CEPHALEXIN 500 MG PO CAPS: 500.0000 mg | ORAL_CAPSULE | Freq: Three times a day (TID) | ORAL | Status: DC

## 2015-10-07 NOTE — Telephone Encounter (Signed)
Pt states the rough area of the cast is rubbing the last 2 toes of her foot, she states she can't come in today, but was wondering what else to do.  I called pt and she said she had padded the area off, and it did feel better but she could tell it was raw.  I told pt to sit and not weight bear or use the knee scooter, as this would change where the cast rubbed, to go to the ER if signs of infection or pain worsened and I refilled the keflex as previously written.  Pt agrees and changed the [facility] to [facility].

## 2015-10-13 ENCOUNTER — Ambulatory Visit (INDEPENDENT_AMBULATORY_CARE_PROVIDER_SITE_OTHER): Payer: 59 | Admitting: Podiatry

## 2015-10-13 ENCOUNTER — Encounter: Payer: Self-pay | Admitting: Podiatry

## 2015-10-13 VITALS — BP 171/98 | HR 87 | Temp 99.1°F | Resp 16

## 2015-10-13 DIAGNOSIS — M7670 Peroneal tendinitis, unspecified leg: Secondary | ICD-10-CM

## 2015-10-13 DIAGNOSIS — Z9889 Other specified postprocedural states: Secondary | ICD-10-CM

## 2015-10-13 DIAGNOSIS — M7672 Peroneal tendinitis, left leg: Secondary | ICD-10-CM

## 2015-10-13 DIAGNOSIS — M76822 Posterior tibial tendinitis, left leg: Secondary | ICD-10-CM

## 2015-10-13 NOTE — Progress Notes (Signed)
She presents today 2 weeks status post posterior tibial tendon repair lateral ankle stabilization. She states that her cast was changed last week and it is extremely tight. She like to have it removed. She straight states that other than a tight cast the foot has done pretty well.  Objective: Vital signs stable alert and oriented 3. Pulses are strongly palpable. Cast was removed sutures remain intact staples are intact. Mild edema no erythema saline as drainage or odor no signs of infection.  Assessment: Well-healing surgical foot left.  Plan: Redress today dresser compressive dressing. Placed her in a Cam Walker and she will remain in this Cam Walker and nonweightbearing. I will follow-up with her in 1-2 weeks and remove his staples.

## 2015-10-22 ENCOUNTER — Ambulatory Visit (INDEPENDENT_AMBULATORY_CARE_PROVIDER_SITE_OTHER): Payer: 59 | Admitting: Podiatry

## 2015-10-22 ENCOUNTER — Encounter: Payer: Self-pay | Admitting: Podiatry

## 2015-10-22 VITALS — BP 142/92 | HR 86 | Resp 12

## 2015-10-22 DIAGNOSIS — Z9889 Other specified postprocedural states: Secondary | ICD-10-CM

## 2015-10-22 MED ORDER — IBUPROFEN 800 MG PO TABS: 800.0000 mg | ORAL_TABLET | Freq: Three times a day (TID) | ORAL | Status: DC | PRN

## 2015-10-22 NOTE — Progress Notes (Signed)
She presents today one month status post posterior tibial tendon repair and lateral ankle stabilization with peroneal tendon repair left foot. Date of surgery 09/25/2015. She states that she feels pulling and pain around her ankle. Denies changes in her past medical history medications allergies surgeries or social history. States that her diabetes is under good control at this point. She does relate that she hit the foot while at her mother's house which resulted in significant discomfort.  Objective: Vital signs are stable she is alert and oriented 3. Dry sterile dressing was removed today demonstrates staples are intact margins are well coapted. We removed all of the staples today margins remain well coapted and there is minimal edema mild erythema overlying the fibula near the incision site. She has great range of motion with few symptoms on range of motion.  Assessment: Well-healed surgical foot and ankle left.  Plan: Put her in a compression anklet she is to compete continue nonweightbearing status utilizing her scooter or crutches. I will follow-up with her in 3 weeks.

## 2015-11-04 NOTE — Progress Notes (Signed)
Patient ID: Amy Adams, female   DOB: Jul 28, 1966, 49 y.o.   MRN: [id number] Dr Milinda Pointer performed a primary repair posterior tibial tendon left foot, primary repair peroneal tendons, left foot, with cast application on [id number] at [facility]

## 2015-11-09 NOTE — Telephone Encounter (Signed)
Entered in error

## 2015-11-12 ENCOUNTER — Encounter: Payer: Self-pay | Admitting: Podiatry

## 2015-11-12 ENCOUNTER — Ambulatory Visit (INDEPENDENT_AMBULATORY_CARE_PROVIDER_SITE_OTHER): Payer: 59 | Admitting: Podiatry

## 2015-11-12 VITALS — BP 159/94 | HR 62 | Resp 12

## 2015-11-12 DIAGNOSIS — M76822 Posterior tibial tendinitis, left leg: Secondary | ICD-10-CM

## 2015-11-12 DIAGNOSIS — M7670 Peroneal tendinitis, unspecified leg: Secondary | ICD-10-CM

## 2015-11-12 DIAGNOSIS — M7672 Peroneal tendinitis, left leg: Secondary | ICD-10-CM

## 2015-11-12 DIAGNOSIS — Z9889 Other specified postprocedural states: Secondary | ICD-10-CM

## 2015-11-12 NOTE — Progress Notes (Signed)
She presents today for follow-up of her surgery performed 09/25/2015 to her left ankle. She states that she has had 4 falls recently and continues to utilize her scooter. She states that her left foot and ankle continues to be in pain. She denies fever chills nausea vomiting muscle aches pains denies chest pain or shortness of breath. She states that her blood sugars have been slightly elevated lately.  Objective: Vital signs are stable she is alert and oriented 3. Significant muscle mass loss to the posterior calf and left leg. She still has significant scarring overlying the anterior lateral ankle repair and posterior tibial tendon repair left ankle. She has full range of motion of the ankle joint and subtalar joint and full strength of all of the tendons dorsiflexors plantar flexors and inverters and everters.  Assessment: Well-healing surgical foot left.  Plan: I encouraged her to continue her compression anklet if needed. I placed her in a Tri-Lock brace and encouraged ambulation. I'm going to request that she get back to work after January 11. I want to follow up with her in 4 weeks.

## 2015-11-23 DIAGNOSIS — E039 Hypothyroidism, unspecified: Secondary | ICD-10-CM | POA: Diagnosis not present

## 2015-11-23 DIAGNOSIS — G609 Hereditary and idiopathic neuropathy, unspecified: Secondary | ICD-10-CM | POA: Diagnosis not present

## 2015-11-23 DIAGNOSIS — E7800 Pure hypercholesterolemia, unspecified: Secondary | ICD-10-CM | POA: Diagnosis not present

## 2015-11-23 DIAGNOSIS — E1165 Type 2 diabetes mellitus with hyperglycemia: Secondary | ICD-10-CM | POA: Diagnosis not present

## 2015-11-23 DIAGNOSIS — E049 Nontoxic goiter, unspecified: Secondary | ICD-10-CM | POA: Diagnosis not present

## 2015-11-24 ENCOUNTER — Telehealth: Payer: Self-pay | Admitting: *Deleted

## 2015-11-24 ENCOUNTER — Encounter: Payer: Self-pay | Admitting: *Deleted

## 2015-11-24 DIAGNOSIS — Z9889 Other specified postprocedural states: Secondary | ICD-10-CM

## 2015-11-24 DIAGNOSIS — M7672 Peroneal tendinitis, left leg: Secondary | ICD-10-CM

## 2015-11-24 DIAGNOSIS — M76822 Posterior tibial tendinitis, left leg: Secondary | ICD-10-CM

## 2015-11-24 NOTE — Telephone Encounter (Addendum)
Pt states she is in severe pain and there is swelling.  Pt did not indicate which foot, she was having these problems.  Left message to call again and have me paged.  Called pt again, pt states the inside of her foot from the heel to the arch is pulling the toes and cramping, this has been going on for about 5 days, taking Ibuprofen 800mg  3 times a day without relief.  Pt states she can't get in to the office until Thursday.  Dr. Milinda Pointer states go back in to the cam walker, and stay in it, PT at [facility].  Pt states she'll have to go back in the surgical sandal, because the cam walker is at her mtr's house, and she'll need a note to go back to work in the cam walker and that she will be going to PT.  Dr. Milinda Pointer ordered PT for evaluation and treatment of left leg status post tendon surgery posterior tibial and peroneal repairs focusing on flexiblity, strengthening and transitioning from cam walker to athletic shoe.  Letter written for pt to pick up tomorrow.  11/26/2015 - pt states she is in so much pain with the tightness in the foot, she is afraid to go back to work then possible be in so much pain she would have to leave and be written up and she doesn't have anymore leave days. Pt states she is to see Dr. Milinda Pointer on 12/10/2015.  Dr. Milinda Pointer states have pt out of work until she is reevaluated on 12/10/2015.  Pt request I inform our FMLA agent J. Albert, and pt would like to pick up a copy of the letter today.   12/15/2015 - [employer] - AGENT REQUEST more information concerning pt's return to work restrictions.

## 2015-11-24 NOTE — Telephone Encounter (Signed)
Tell her to put her cam walker back on and stay in it. You can also send her to PT at [facility] and see if she can get some relief that way.

## 2015-11-26 ENCOUNTER — Encounter: Payer: Self-pay | Admitting: *Deleted

## 2015-11-30 ENCOUNTER — Ambulatory Visit: Payer: 59 | Attending: Family Medicine

## 2015-11-30 DIAGNOSIS — R262 Difficulty in walking, not elsewhere classified: Secondary | ICD-10-CM | POA: Insufficient documentation

## 2015-11-30 DIAGNOSIS — M25672 Stiffness of left ankle, not elsewhere classified: Secondary | ICD-10-CM | POA: Diagnosis not present

## 2015-11-30 DIAGNOSIS — M259 Joint disorder, unspecified: Secondary | ICD-10-CM | POA: Diagnosis not present

## 2015-11-30 DIAGNOSIS — M25572 Pain in left ankle and joints of left foot: Secondary | ICD-10-CM | POA: Diagnosis not present

## 2015-11-30 DIAGNOSIS — R29898 Other symptoms and signs involving the musculoskeletal system: Secondary | ICD-10-CM

## 2015-11-30 NOTE — Patient Instructions (Signed)
STW from family gentle to foot and calf , active ankle ROM issued from cabinet, all planes x 10 x2 per day.  .REmoval of kineseotape if irritable and keep on if helpful. Can get wet.

## 2015-11-30 NOTE — Therapy (Signed)
[place], [place], [address] Phone: [phone]   Fax:  [phone]  Physical Therapy Evaluation  Patient Details  Name: Amy Adams MRN: [id number] Date of Birth: 01/16/1966 Referring Provider: Tyson Dense, DPM  Encounter Date: 11/30/2015      PT End of Session - 11/30/15 1221    Visit Number 1      Past Medical History  Diagnosis Date  . Diabetes mellitus without complication ([place])   . Arthritis   . Anxiety   . History of migraine headaches   . Thyroid disease   . Hyperlipidemia   . Hypertension     Past Surgical History  Procedure Laterality Date  . Metatarsal osteotomy Left   . Hammer toe surgery Left   . Endoscopic plantar fasciotomy Left   . Phenol matrixectomy Right     1st toenail  . Abdominal hysterectomy    . Carpal tunnel release    . Tonsillectomy    . Cesarean section      There were no vitals filed for this visit.  Visit Diagnosis:  Stiffness of ankle joint, left - Plan: PT plan of care cert/re-cert  Ankle weakness - Plan: PT plan of care cert/re-cert  Pain in joint, ankle and foot, left - Plan: PT plan of care cert/re-cert  Difficulty walking - Plan: PT plan of care cert/re-cert      Subjective Assessment - 11/30/15 1130    Subjective Amy Adams reports post surgery to LT foot and has been NWB until 2 weeks ago. Now WBAT in boot and now in PT to work off of boot to normal footwear. She is not working now .    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    Limitations Walking;Standing;Sitting  Not doing normal shopping and home tasks   How long can you sit comfortably? 30 min max   How long can you stand comfortably? 15 min max   How long can you walk comfortably? 10 min   Patient Stated Goals Stretch out  to ease pain ans swelling and return to wlaking in home and work    Currently in Pain? Yes   Pain Score 6    Pain Location  Ankle  and foot   Pain Orientation Left   Pain Descriptors / Indicators Burning;Tightness  pulling   Pain Type Surgical pain   Pain Onset More than a month ago   Pain Frequency Constant   Aggravating Factors  walking / standing /sitting   Pain Relieving Factors rest/ elevation/cold   Multiple Pain Sites No            [facility] PT Assessment - 11/30/15 1111    Assessment   Medical Diagnosis LT peroneal an dposterior tendon repair    Referring Provider Tyson Dense, DPM   Onset Date/Surgical Date 09/25/15   Next MD Visit 12/10/15   Prior Therapy NA   Precautions   Precaution Comments Wear boot and strap   Restrictions   Weight Bearing Restrictions No   Balance Screen   Has the patient fallen in the past 6 months Yes   How many times? 4  off balance due to not bearing weight to LT foot   Has the patient had a decrease in activity level because of a fear of falling?  Yes   Is the patient reluctant to leave their home because of a fear of falling?  No   Home Environment  Living Environment Private residence   Living Arrangements Spouse/significant other;Children;Other relatives   Type of Home --  mobile home   Home Access Stairs to enter   Entrance Stairs-Number of Steps 5   Entrance Stairs-Rails Right;Left;Cannot reach both   Prior Function   Level of Independence Needs assistance with ADLs;Needs assistance with homemaking   Vocation Full time employment  last worked 09/23/15   Vocation Requirements clerical duties   Cognition   Overall Cognitive Status Within Functional Limits for tasks assessed   Observation/Other Assessments-Edema    Edema Figure 8   Figure 8 Edema   Figure 8 - Right  51 cm   Figure 8 - Left  50.5 cm   ROM / Strength   AROM / PROM / Strength AROM;Strength;PROM   AROM   AROM Assessment Site Ankle   Right/Left Ankle Right;Left   Right Ankle Dorsiflexion 100   Right Ankle Plantar Flexion 100   Right Ankle Inversion 30   Right Ankle Eversion 25    Left Ankle Dorsiflexion 82   Left Ankle Plantar Flexion 90   Left Ankle Inversion 25   Left Ankle Eversion 25   PROM   PROM Assessment Site Ankle   Right/Left Ankle Left   Left Ankle Dorsiflexion 105   Left Ankle Plantar Flexion 90   Left Ankle Inversion 45   Left Ankle Eversion 30   Strength   Strength Assessment Site Ankle   Right/Left Ankle Right;Left   Right Ankle Dorsiflexion 5/5   Right Ankle Plantar Flexion 4+/5  done manually not standing   Right Ankle Inversion 5/5   Right Ankle Eversion 5/5   Left Ankle Dorsiflexion 4/5  with pain   Left Ankle Plantar Flexion 3+/5  with pain   Left Ankle Inversion 4/5  with pain   Left Ankle Eversion 4/5  with pain   Ambulation/Gait   Gait velocity 1 m/sec   Gait Comments Walked in today with normal footwear/tennis shoes with decreased weight to LT leg.                      [facility] PT Treatment/Exercise - 11/30/15 1111    Exercises   Exercises Ankle  Active ROM x 5 reps each way   Manual Therapy   Manual Therapy Taping   Soft tissue mobilization Gentle STW from foot to knee all sides   Orion @ y's medial and lateral with split around medial and lateral incision.                 PT Education - 11/30/15 1220    Education provided Yes   Education Details POC, tape management , ROm ankle   Person(s) Educated Patient   Methods Explanation;Demonstration;Verbal cues;Handout;Tactile cues   Comprehension Returned demonstration;Verbalized understanding          PT Short Term Goals - 11/30/15 1118    PT SHORT TERM GOAL #1   Title She will be independent with intial HEP   Time 2   Period Weeks   Status New   PT SHORT TERM GOAL #2   Title She will report pain decr  40% or more  with walking   Time 2   Period Weeks   Status New   PT SHORT TERM GOAL #3   Title She will increase walking without boot for 2-3 hours per day   Time 2   Period Weeks  Status New   PT SHORT TERM GOAL #4   Title She will improve active ROM to RT foot/ankle   Time 2   Period Weeks   Status New           PT Long Term Goals - 11/30/15 1119    PT LONG TERM GOAL #1   Title she will be independent with all HEP issued as of last visit   Time 4   Period Weeks   Status New   PT LONG TERM GOAL #2   Title she will be walking without boot 100% for home activity   Time 4   Period Weeks   Status New   PT LONG TERM GOAL #3   Title she will return to work without boot. , normal foot wear   Time 4   Period Weeks   Status New   PT LONG TERM GOAL #4   Title She will report no pain sitting with leg dependent   Time 4   Period Weeks   Status New               Plan - 11/30/15 1121    Clinical Impression Statement Amy Snipes presents with pain and limited active motion LT foot/ankle and difficulty walking with normal footwear. She is sensative to touch. She is limited with home tasks and is not working at this time. She should improve withPT   Pt will benefit from skilled therapeutic intervention in order to improve on the following deficits Pain;Difficulty walking;Decreased activity tolerance;Decreased range of motion;Decreased strength   Rehab Potential Good   PT Frequency 3x / week   PT Duration 4 weeks   PT Treatment/Interventions Cryotherapy;Moist Heat;Electrical Stimulation;Therapeutic exercise;Gait training;Patient/family education;Manual techniques;Passive range of motion;Taping;Balance training   PT Next Visit Plan REview hEP, start isometrics, manual techniques . tape and modalities   PT Home Exercise Plan Active ROM   Consulted and Agree with Plan of Care Patient         Problem List Patient Active Problem List   Diagnosis Date Noted  . HYPOTHYROIDISM 12/01/2010  . DM 12/01/2010  . 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 OTHER AND UNSPECIFIED HYPERLIPIDEMIA 12/01/2010  . OBESITY, UNSPECIFIED 12/01/2010  .  ESSENTIAL HYPERTENSION, BENIGN 12/01/2010  . RHEUMATOID ARTHRITIS 12/01/2010    Darrel Hoover PT 11/30/2015, 12:28 PM  [facility] [address] [place], [place], [address] Phone: [phone]   Fax:  [phone]  Name: Amy Adams MRN: [id number] Date of Birth: [birth year]

## 2015-12-02 ENCOUNTER — Ambulatory Visit: Payer: 59

## 2015-12-02 ENCOUNTER — Other Ambulatory Visit (HOSPITAL_COMMUNITY): Payer: Self-pay | Admitting: Endocrinology

## 2015-12-02 DIAGNOSIS — E049 Nontoxic goiter, unspecified: Secondary | ICD-10-CM

## 2015-12-03 ENCOUNTER — Ambulatory Visit: Payer: 59

## 2015-12-03 DIAGNOSIS — M25572 Pain in left ankle and joints of left foot: Secondary | ICD-10-CM | POA: Diagnosis not present

## 2015-12-03 DIAGNOSIS — M25672 Stiffness of left ankle, not elsewhere classified: Secondary | ICD-10-CM | POA: Diagnosis not present

## 2015-12-03 DIAGNOSIS — M259 Joint disorder, unspecified: Secondary | ICD-10-CM | POA: Diagnosis not present

## 2015-12-03 DIAGNOSIS — R262 Difficulty in walking, not elsewhere classified: Secondary | ICD-10-CM

## 2015-12-03 DIAGNOSIS — R29898 Other symptoms and signs involving the musculoskeletal system: Secondary | ICD-10-CM

## 2015-12-03 NOTE — Therapy (Signed)
[place], [place], [address] Phone: [phone]   Fax:  [phone]  Physical Therapy Treatment  Patient Details  Name: Amy Adams MRN: [id number] Date of Birth: 01/15/1966 Referring Provider: Tyson Dense, DPM  Encounter Date: 12/03/2015      PT End of Session - 12/03/15 0932    Visit Number 2   Number of Visits 12   Date for PT Re-Evaluation 12/28/15   PT Start Time 0845   PT Stop Time 0930   PT Time Calculation (min) 45 min   Activity Tolerance Patient tolerated treatment well   Behavior During Therapy [facility] for tasks assessed/performed      Past Medical History  Diagnosis Date  . Diabetes mellitus without complication ([facility])   . Arthritis   . Anxiety   . History of migraine headaches   . Thyroid disease   . Hyperlipidemia   . Hypertension     Past Surgical History  Procedure Laterality Date  . Metatarsal osteotomy Left   . Hammer toe surgery Left   . Endoscopic plantar fasciotomy Left   . Phenol matrixectomy Right     1st toenail  . Abdominal hysterectomy    . Carpal tunnel release    . Tonsillectomy    . Cesarean section      There were no vitals filed for this visit.  Visit Diagnosis:  Stiffness of ankle joint, left  Ankle weakness  Pain in joint, ankle and foot, left  Difficulty walking      Subjective Assessment - 12/03/15 0853    Subjective No changes May be looser from exercise. Had a pop that felt good. My foot postures in, not flat/straight. ( suggested weakness as possible reason)   Patient Stated Goals Stretch out  to ease pain and swelling and return to walking in home and work    Currently in Pain? Yes   Pain Score 6    Pain Location Ankle   Pain Orientation Left   Pain Descriptors / Indicators Burning;Tightness   Pain Type Surgical pain   Pain Onset More than a month ago   Multiple Pain Sites No                         [facility] Adult PT  Treatment/Exercise - 12/03/15 0853    Manual Therapy   Manual Therapy Passive ROM   Manual therapy comments All planes  multiple reps   Soft tissue mobilization Gentle STW from foot to knee all sides but more presure than at eval for desensitivity and STW   Kinesiotix   Create Space 2 Y's medial an lateral ankle into calf    Ankle Exercises: Aerobic   Stationary Bike L3 5 min   Ankle Exercises: Supine   Other Supine Ankle Exercises Manually resisted in all planes x 20                  PT Short Term Goals - 11/30/15 1118    PT SHORT TERM GOAL #1   Title She will be independent with intial HEP   Time 2   Period Weeks   Status New   PT SHORT TERM GOAL #2   Title She will report pain decr  40% or more  with walking   Time 2   Period Weeks   Status New   PT SHORT TERM GOAL #3   Title She will increase walking without boot for 2-3  hours per day   Time 2   Period Weeks   Status New   PT SHORT TERM GOAL #4   Title She will improve active ROM to RT foot/ankle   Time 2   Period Weeks   Status New           PT Long Term Goals - 11/30/15 1119    PT LONG TERM GOAL #1   Title she will be independent with all HEP issued as of last visit   Time 4   Period Weeks   Status New   PT LONG TERM GOAL #2   Title she will be walking without boot 100% for home activity   Time 4   Period Weeks   Status New   PT LONG TERM GOAL #3   Title she will return to work without boot. , normal foot wear   Time 4   Period Weeks   Status New   PT LONG TERM GOAL #4   Title She will report no pain sitting with leg dependent   Time 4   Period Weeks   Status New               Plan - 12/03/15 0933    Clinical Impression Statement She was less tender today and was able to move further into ROM actively. She was able to demo AROM HEP She tolerated moderate resistance with exercise and next time we will add Tband exercises for home.    PT Next Visit Plan Band exercises, manual  technique, retape   Consulted and Agree with Plan of Care Patient        Problem List Patient Active Problem List   Diagnosis Date Noted  . HYPOTHYROIDISM 12/01/2010  . DM 12/01/2010  . 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 OTHER AND UNSPECIFIED HYPERLIPIDEMIA 12/01/2010  . OBESITY, UNSPECIFIED 12/01/2010  . ESSENTIAL HYPERTENSION, BENIGN 12/01/2010  . RHEUMATOID ARTHRITIS 12/01/2010    Darrel Hoover PT 12/03/2015, 9:47 AM  [facility] [address] [place], [place], [address] Phone: [phone]   Fax:  [phone]  Name: Amy Adams MRN: [id number] Date of Birth: 10-01-1966

## 2015-12-03 NOTE — Patient Instructions (Signed)
Reviewed tape management to leave on if helpful, remove if irritating, She can get tape wet and keep on for 3-4 days if helpful

## 2015-12-07 ENCOUNTER — Ambulatory Visit: Payer: 59

## 2015-12-07 DIAGNOSIS — M259 Joint disorder, unspecified: Secondary | ICD-10-CM | POA: Diagnosis not present

## 2015-12-07 DIAGNOSIS — R262 Difficulty in walking, not elsewhere classified: Secondary | ICD-10-CM

## 2015-12-07 DIAGNOSIS — R29898 Other symptoms and signs involving the musculoskeletal system: Secondary | ICD-10-CM

## 2015-12-07 DIAGNOSIS — M25572 Pain in left ankle and joints of left foot: Secondary | ICD-10-CM | POA: Diagnosis not present

## 2015-12-07 DIAGNOSIS — M25672 Stiffness of left ankle, not elsewhere classified: Secondary | ICD-10-CM

## 2015-12-07 NOTE — Therapy (Signed)
[employer] [place], [place], [address] Phone: [phone]   Fax:  [phone]  Physical Therapy Treatment  Patient Details  Name: Amy Adams MRN: [id number] Date of Birth: [birth year] Referring Provider: Tyson Dense, DPM  Encounter Date: 12/07/2015      PT End of Session - 12/07/15 1221    Visit Number 3   Number of Visits 12   Date for PT Re-Evaluation 12/28/15   PT Start Time 1125   PT Stop Time 1210   PT Time Calculation (min) 45 min   Activity Tolerance Patient tolerated treatment well;Patient limited by pain   Behavior During Therapy [facility] for tasks assessed/performed      Past Medical History  Diagnosis Date  . Diabetes mellitus without complication ([place])   . Arthritis   . Anxiety   . History of migraine headaches   . Thyroid disease   . Hyperlipidemia   . Hypertension     Past Surgical History  Procedure Laterality Date  . Metatarsal osteotomy Left   . Hammer toe surgery Left   . Endoscopic plantar fasciotomy Left   . Phenol matrixectomy Right     1st toenail  . Abdominal hysterectomy    . Carpal tunnel release    . Tonsillectomy    . Cesarean section      There were no vitals filed for this visit.  Visit Diagnosis:  Stiffness of ankle joint, left  Ankle weakness  Pain in joint, ankle and foot, left  Difficulty walking      Subjective Assessment - 12/07/15 1134    Subjective "At night foot freaking kills me."  Today 3-4/10. Worked tissue  to break up scar. Lateral ankle and heel cord pain almost resolved with medial andkle and arch area most tight and tender. S   Currently in Pain? Yes   Pain Score 4    Pain Location Ankle   Pain Orientation Left   Pain Descriptors / Indicators Burning   Pain Type Surgical pain   Pain Onset More than a month ago   Pain Frequency Constant                         [facility] Adult PT Treatment/Exercise - 12/07/15 1123    Neuro  Re-ed    Neuro Re-ed Details  We worked with mer on balance single leg with RT foot on toe 2x10 reps to rebounder.  Also walked line 12 feet x 6 with CG light touch and on 2x6 8 feet x 4 with close guard.    Ankle Exercises: Aerobic   Stationary Bike L3 5 min   Ankle Exercises: Seated   Other Seated Ankle Exercises towell exercises medial/lateral, scrunches x 25 each and red band exercises. x 15 allplanes                PT Education - 12/07/15 1221    Education provided Yes   Education Details HEP and contrast bath   Person(s) Educated Patient   Methods Explanation;Demonstration;Tactile cues;Verbal cues;Handout   Comprehension Returned demonstration;Verbalized understanding          PT Short Term Goals - 11/30/15 1118    PT SHORT TERM GOAL #1   Title She will be independent with intial HEP   Time 2   Period Weeks   Status New   PT SHORT TERM GOAL #2   Title She will report pain decr  40% or more  with walking   Time 2   Period Weeks   Status New   PT SHORT TERM GOAL #3   Title She will increase walking without boot for 2-3 hours per day   Time 2   Period Weeks   Status New   PT SHORT TERM GOAL #4   Title She will improve active ROM to RT foot/ankle   Time 2   Period Weeks   Status New           PT Long Term Goals - 12/07/15 1235    PT LONG TERM GOAL #1   Title she will be independent with all HEP issued as of last visit   Status Achieved   PT LONG TERM GOAL #2   Title she will be walking without boot 100% for home activity   Status On-going   PT LONG TERM GOAL #3   Title she will return to work without boot. , normal foot wear   Status Unable to assess   PT LONG TERM GOAL #4   Title She will report no pain sitting with leg dependent   Status On-going               Plan - 12/07/15 1238    Clinical Impression Statement She appears improved as she has less pain and more focused in area. She appears to walk with less limp coming in to clinic  and active motion improved but needs to be measured  next visit  activeloy.   PT Next Visit Plan assess new exercise , benfit of contrast bath, measure ROm . work on balance but limit full static weigh to LT foot as arch is still sensitive to heavier constant loading   PT Home Exercise Plan band and toel exercise   Consulted and Agree with Plan of Care Patient        Problem List Patient Active Problem List   Diagnosis Date Noted  . HYPOTHYROIDISM 12/01/2010  . DM 12/01/2010  . 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 OTHER AND UNSPECIFIED HYPERLIPIDEMIA 12/01/2010  . OBESITY, UNSPECIFIED 12/01/2010  . ESSENTIAL HYPERTENSION, BENIGN 12/01/2010  . RHEUMATOID ARTHRITIS 12/01/2010    Darrel Hoover PT 12/07/2015, 12:45 PM  [facility] [address] [place], [place], [address] Phone: [phone]   Fax:  [phone]  Name: Amy Adams MRN: [id number] Date of Birth: 11/05/1966

## 2015-12-07 NOTE — Patient Instructions (Signed)
Issued from cabinet  Towel scrunches and medial /lateral ankle movements x 25 reps daily. Also issued band exercises red and band issued with daily exercise x 15-25 reps . Cued to not roll lower leg and she corrected .  And asked her  to practice narrowed walking /BOS but not heel to toe type walking.  Also issued and explained contrast bath and she has done this in past . Suggested doing this in PM prior to bed to see if pain can be eased at night.

## 2015-12-08 ENCOUNTER — Encounter: Payer: Self-pay | Admitting: Podiatry

## 2015-12-09 ENCOUNTER — Ambulatory Visit: Payer: 59

## 2015-12-09 DIAGNOSIS — M25672 Stiffness of left ankle, not elsewhere classified: Secondary | ICD-10-CM

## 2015-12-09 DIAGNOSIS — R29898 Other symptoms and signs involving the musculoskeletal system: Secondary | ICD-10-CM

## 2015-12-09 DIAGNOSIS — R262 Difficulty in walking, not elsewhere classified: Secondary | ICD-10-CM | POA: Diagnosis not present

## 2015-12-09 DIAGNOSIS — M25572 Pain in left ankle and joints of left foot: Secondary | ICD-10-CM | POA: Diagnosis not present

## 2015-12-09 DIAGNOSIS — M259 Joint disorder, unspecified: Secondary | ICD-10-CM | POA: Diagnosis not present

## 2015-12-10 ENCOUNTER — Encounter: Payer: Self-pay | Admitting: Podiatry

## 2015-12-10 ENCOUNTER — Ambulatory Visit (INDEPENDENT_AMBULATORY_CARE_PROVIDER_SITE_OTHER): Payer: 59 | Admitting: Podiatry

## 2015-12-10 VITALS — BP 162/99 | HR 92 | Resp 12

## 2015-12-10 DIAGNOSIS — M7670 Peroneal tendinitis, unspecified leg: Secondary | ICD-10-CM | POA: Diagnosis not present

## 2015-12-10 DIAGNOSIS — E1142 Type 2 diabetes mellitus with diabetic polyneuropathy: Secondary | ICD-10-CM

## 2015-12-10 DIAGNOSIS — M76822 Posterior tibial tendinitis, left leg: Secondary | ICD-10-CM

## 2015-12-10 DIAGNOSIS — Z9889 Other specified postprocedural states: Secondary | ICD-10-CM

## 2015-12-10 NOTE — Progress Notes (Signed)
She presents today telling me that she seems to be making headway with physical therapy regarding her left foot and ankle. States that just recently it has really started to feel much better. She would like to consider going back to work in the near future. She would also like to continue physical therapy.  Objective: Vital signs are stable she is alert and oriented 3. No erythema edema saline as drainage or odor left foot. She's good range of motion with good muscle strength left foot.  Assessment: Well-healing surgical foot and ankle left lateral ankle stabilization as well as posterior tibial tendon repair.  Plan: Continue physical therapy regularly and we will allow her to get back to work where she sits primarily at work. I will follow-up with her in about a month.

## 2015-12-14 ENCOUNTER — Ambulatory Visit: Payer: 59

## 2015-12-14 DIAGNOSIS — M25672 Stiffness of left ankle, not elsewhere classified: Secondary | ICD-10-CM

## 2015-12-14 DIAGNOSIS — M25572 Pain in left ankle and joints of left foot: Secondary | ICD-10-CM | POA: Diagnosis not present

## 2015-12-14 DIAGNOSIS — R262 Difficulty in walking, not elsewhere classified: Secondary | ICD-10-CM

## 2015-12-14 DIAGNOSIS — R29898 Other symptoms and signs involving the musculoskeletal system: Secondary | ICD-10-CM

## 2015-12-14 DIAGNOSIS — M259 Joint disorder, unspecified: Secondary | ICD-10-CM | POA: Diagnosis not present

## 2015-12-14 NOTE — Therapy (Addendum)
[place], [place], [address] Phone: [phone]   Fax:  [phone]  Physical Therapy Treatment  Patient Details  Name: Amy Adams MRN: [id number] Date of Birth: [birth year] Referring Provider: Tyson Dense, DPM  Encounter Date: 12/14/2015      PT End of Session - 12/14/15 1324    Visit Number 5   Number of Visits 12   Date for PT Re-Evaluation 01/12/16   PT Start Time 1230   PT Stop Time 1315   PT Time Calculation (min) 45 min   Activity Tolerance Patient tolerated treatment well   Behavior During Therapy [facility] for tasks assessed/performed      Past Medical History  Diagnosis Date  . Diabetes mellitus without complication ([place])   . Arthritis   . Anxiety   . History of migraine headaches   . Thyroid disease   . Hyperlipidemia   . Hypertension     Past Surgical History  Procedure Laterality Date  . Metatarsal osteotomy Left   . Hammer toe surgery Left   . Endoscopic plantar fasciotomy Left   . Phenol matrixectomy Right     1st toenail  . Abdominal hysterectomy    . Carpal tunnel release    . Tonsillectomy    . Cesarean section      There were no vitals filed for this visit.  Visit Diagnosis:  Stiffness of ankle joint, left - Plan: PT plan of care cert/re-cert  Ankle weakness - Plan: PT plan of care cert/re-cert  Pain in joint, ankle and foot, left - Plan: PT plan of care cert/re-cert  Difficulty walking - Plan: PT plan of care cert/re-cert      Subjective Assessment - 12/14/15 1318    Subjective Felt much bette after last session DPM was pleaed and wanted cont PT until end of February. We will do 2x/wek for 4 weeks.    Currently in Pain? Yes   Pain Score 2    Pain Location Ankle   Pain Orientation Left   Pain Descriptors / Indicators Burning   Pain Type Surgical pain   Pain Onset More than a month ago   Pain Frequency Intermittent   Aggravating Factors  walk/stand    Pain  Relieving Factors rest elevation, cold   Multiple Pain Sites No                         [facility] Adult PT Treatment/Exercise - 12/14/15 1320    Neuro Re-ed    Neuro Re-ed Details  Single leg balance with short 5 sec bouts and changing UE support. She only had minor soreness from this. Shoe and sock only   Manual Therapy   Soft tissue mobilization Gentle STW from foot to knee medical aspect in arch  and along incision.  Used rock tool for scar and STW   Ankle Exercises: Aerobic   Stationary Bike L3 8 min   Ankle Exercises: Standing   Heel Raises 15 reps  short range with emphasis raising arch    Ankle Exercises: Stretches   Plantar Fascia Stretch 2 reps;30 seconds   Soleus Stretch 2 reps;30 seconds   Gastroc Stretch 2 reps;30 seconds                  PT Short Term Goals - 12/09/15 1206    PT SHORT TERM GOAL #1   Title She will be independent with intial HEP   Status Achieved  PT SHORT TERM GOAL #2   Title She will report pain decr  40% or more  with walking   Status On-going   PT SHORT TERM GOAL #3   Title She will increase walking without boot for 2-3 hours per day   PT SHORT TERM GOAL #4   Title She will improve active ROM equal to RT foot/ankle   Status Achieved           PT Long Term Goals - 12/09/15 1207    PT LONG TERM GOAL #1   Title she will be independent with all HEP issued as of last visit   Status On-going   PT LONG TERM GOAL #2   Title she will be walking without boot 100% for home activity   Status On-going   PT LONG TERM GOAL #3   Title she will return to work without boot. , normal foot wear   Status On-going   PT LONG TERM GOAL #4   Title She will report no pain sitting with leg dependent   Status On-going               Plan - 12/14/15 1324    Clinical Impression Statement Improved tolerance to weight bearing , stretching and pressure though she is still sensitive in areas of foot that  inc/decr during session.  She reports improved waling distances due to less pain   PT Frequency 2x / week   PT Duration 4 weeks   PT Next Visit Plan Continue strength, balance STW, cold as needed   PT Home Exercise Plan band and towel exercise   Consulted and Agree with Plan of Care Patient        Problem List Patient Active Problem List   Diagnosis Date Noted  . HYPOTHYROIDISM 12/01/2010  . DM 12/01/2010  . 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 OTHER AND UNSPECIFIED HYPERLIPIDEMIA 12/01/2010  . OBESITY, UNSPECIFIED 12/01/2010  . ESSENTIAL HYPERTENSION, BENIGN 12/01/2010  . RHEUMATOID ARTHRITIS 12/01/2010    Darrel Hoover PT 12/14/2015, 1:29 PM  [facility] [address] [place], [place], [address] Phone: [phone]   Fax:  [phone]  Name: MADINE WOODRUM MRN: [id number] Date of Birth: 1966-07-06

## 2015-12-15 NOTE — Therapy (Addendum)
[place], [place], [address] Phone: [phone]   Fax:  [phone]  Physical Therapy Treatment  Patient Details  Name: Amy Adams MRN: [id number] Date of Birth: Jun 07, 1966 Referring Provider: Tyson Dense, DPM  Encounter Date: 12/09/2015      PT End of Session - 12/14/15 1324    Visit Number 4   Number of Visits 12   Date for PT Re-Evaluation 01/12/16   PT Start Time [id number]   PT Stop Time 1230   PT Time Calculation (min) 45 min   Activity Tolerance Patient tolerated treatment well   Behavior During Therapy [facility] for tasks assessed/performed      Past Medical History  Diagnosis Date  . Diabetes mellitus without complication ([place])   . Arthritis   . Anxiety   . History of migraine headaches   . Thyroid disease   . Hyperlipidemia   . Hypertension     Past Surgical History  Procedure Laterality Date  . Metatarsal osteotomy Left   . Hammer toe surgery Left   . Endoscopic plantar fasciotomy Left   . Phenol matrixectomy Right     1st toenail  . Abdominal hysterectomy    . Carpal tunnel release    . Tonsillectomy    . Cesarean section      There were no vitals filed for this visit.  Visit Diagnosis:  Stiffness of ankle joint, left  Ankle weakness  Pain in joint, ankle and foot, left  Difficulty walking    Subjective: Felt better after last visit . Can bear weight more.    Objective: 2/10 pain medical foot. Hyman Hopes LT with burning sensation                   AROM:  DF 103     PF 98    INVER  38  EVER  25                  Strength:  DF 5/5  PF 3+/5  Inversion 5/5                   Treatment  Bike L3  8 mn for LE conditioning                    NMR;  BAPS sitting LS x 15 colck and sounter clockwise with asssit for initial 6 reps x2 sets                   There exer : Towel scrunches  X 25, green band 4 way x 15                    Manual : STW to scar medial LT ankle for scar  mobility           PT Short Term Goals - 12/09/15 1206    PT SHORT TERM GOAL #1   Title She will be independent with intial HEP   Status Achieved   PT SHORT TERM GOAL #2   Title She will report pain decr  40% or more  with walking   Status On-going   PT SHORT TERM GOAL #3   Title She will increase walking without boot for 2-3 hours per day   PT SHORT TERM GOAL #4   Title She will improve active ROM equal to RT foot/ankle   Status Achieved  PT Long Term Goals - 12/09/15 1207    PT LONG TERM GOAL #1   Title she will be independent with all HEP issued as of last visit   Status On-going   PT LONG TERM GOAL #2   Title she will be walking without boot 100% for home activity   Status On-going   PT LONG TERM GOAL #3   Title she will return to work without boot. , normal foot wear   Status On-going   PT LONG TERM GOAL #4   Title She will report no pain sitting with leg dependent   Status On-going               Plan - 12/14/15 1324    Clinical Impression Statement Improved tolerance to weight bearing , stretching and pressure though she is still sensitive in areas of foot that  inc/decr during session. She reports improved waling distances due to less pain   PT Frequency 2x / week   PT Duration 4 weeks   PT Next Visit Plan Continue strength, balance STW, cold as needed   PT Home Exercise Plan band and towel exercise   Consulted and Agree with Plan of Care Patient        Problem List Patient Active Problem List   Diagnosis Date Noted  . HYPOTHYROIDISM 12/01/2010  . DM 12/01/2010  . 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 OTHER AND UNSPECIFIED HYPERLIPIDEMIA 12/01/2010  . OBESITY, UNSPECIFIED 12/01/2010  . ESSENTIAL HYPERTENSION, BENIGN 12/01/2010  . RHEUMATOID ARTHRITIS 12/01/2010    Darrel Hoover PT 12/15/2015, 5:51 PM  [facility] [address] [place], [place],  [address] Phone: [phone]   Fax:  [phone]  Name: DIERDRE CALTON MRN: [id number] Date of Birth: 09/04/1966

## 2015-12-16 ENCOUNTER — Ambulatory Visit: Payer: 59 | Attending: Family Medicine

## 2015-12-16 DIAGNOSIS — R262 Difficulty in walking, not elsewhere classified: Secondary | ICD-10-CM | POA: Diagnosis not present

## 2015-12-16 DIAGNOSIS — M259 Joint disorder, unspecified: Secondary | ICD-10-CM | POA: Diagnosis not present

## 2015-12-16 DIAGNOSIS — M25572 Pain in left ankle and joints of left foot: Secondary | ICD-10-CM

## 2015-12-16 DIAGNOSIS — R29898 Other symptoms and signs involving the musculoskeletal system: Secondary | ICD-10-CM

## 2015-12-16 DIAGNOSIS — M25672 Stiffness of left ankle, not elsewhere classified: Secondary | ICD-10-CM | POA: Diagnosis not present

## 2015-12-16 NOTE — Therapy (Addendum)
[place], [place], [address] Phone: [phone]   Fax:  [phone]  Physical Therapy Treatment  Patient Details  Name: Amy Adams MRN: [id number] Date of Birth: 1966-09-22 Referring Provider: Tyson Dense, DPM  Encounter Date: 12/16/2015      PT End of Session - 12/16/15 1202    Visit Number 6   Number of Visits 12   Date for PT Re-Evaluation 01/12/16   PT Start Time 1150   PT Stop Time 1240   PT Time Calculation (min) 50 min   Activity Tolerance Patient tolerated treatment well   Behavior During Therapy [facility] for tasks assessed/performed      Past Medical History  Diagnosis Date  . Diabetes mellitus without complication ([place])   . Arthritis   . Anxiety   . History of migraine headaches   . Thyroid disease   . Hyperlipidemia   . Hypertension     Past Surgical History  Procedure Laterality Date  . Metatarsal osteotomy Left   . Hammer toe surgery Left   . Endoscopic plantar fasciotomy Left   . Phenol matrixectomy Right     1st toenail  . Abdominal hysterectomy    . Carpal tunnel release    . Tonsillectomy    . Cesarean section      There were no vitals filed for this visit.  Visit Diagnosis:  Stiffness of ankle joint, left  Ankle weakness  Pain in joint, ankle and foot, left  Difficulty walking      Subjective Assessment - 12/16/15 1151    Subjective Pt report good effects from MT at last session.    Currently in Pain? Yes   Pain Score 2    Pain Location Ankle   Pain Orientation Left   Pain Descriptors / Indicators Aching   Pain Type Surgical pain   Pain Onset More than a month ago                         [facility] Adult PT Treatment/Exercise - 12/16/15 0001    Modalities   Modalities Cryotherapy   Cryotherapy   Number Minutes Cryotherapy 10 Minutes   Cryotherapy Location Ankle   Type of Cryotherapy Ice pack   Manual Therapy   Manual therapy comments  PROM L ankle all motions, manual resistance INV/EV x 10 reps.    Soft tissue mobilization STM to L foot medial arch, scar massage, plantar fascia    Ankle Exercises: Standing   SLS 20 secs x  3  without brace   Heel Raises 20 reps  without brace   Other Standing Ankle Exercises Toe raises 10 x 2   without brace   Ankle Exercises: Seated   BAPS Level 3;15 reps   Other Seated Ankle Exercises toel scruntches (toe flexion) towe swipes, (Inv/ eversion),    Other Seated Ankle Exercises Arch lifts over pen  pt unable to create supination at arch during ther ex.                   PT Short Term Goals - 12/09/15 1206    PT SHORT TERM GOAL #1   Title She will be independent with intial HEP   Status Achieved   PT SHORT TERM GOAL #2   Title She will report pain decr  40% or more  with walking   Status On-going   PT SHORT TERM GOAL #3   Title She will  increase walking without boot for 2-3 hours per day   PT SHORT TERM GOAL #4   Title She will improve active ROM equal to RT foot/ankle   Status Achieved           PT Long Term Goals - 12/09/15 1207    PT LONG TERM GOAL #1   Title she will be independent with all HEP issued as of last visit   Status On-going   PT LONG TERM GOAL #2   Title she will be walking without boot 100% for home activity   Status On-going   PT LONG TERM GOAL #3   Title she will return to work without boot. , normal foot wear   Status On-going   PT LONG TERM GOAL #4   Title She will report no pain sitting with leg dependent   Status On-going               Plan - 12/16/15 1252    Clinical Impression Statement Good response to MT at last session. Good scar mobility noted today. Minor TP on dorsal surface releived following STM. Increased fatigue/ discomfort with SLS on L.    PT Next Visit Plan Continue strength, balance STW, cold as needed   Consulted and Agree with Plan of Care Patient        Problem List Patient Active Problem List    Diagnosis Date Noted  . HYPOTHYROIDISM 12/01/2010  . DM 12/01/2010  . 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 OTHER AND UNSPECIFIED HYPERLIPIDEMIA 12/01/2010  . OBESITY, UNSPECIFIED 12/01/2010  . ESSENTIAL HYPERTENSION, BENIGN 12/01/2010  . RHEUMATOID ARTHRITIS 12/01/2010    Amy Adams, PT 12/16/2015, 1:26 PM  [facility] [address] [place], [place], [address] Phone: [phone]   Fax:  [phone]  Name: Amy Adams MRN: [id number] Date of Birth: 08-12-1966

## 2015-12-22 ENCOUNTER — Ambulatory Visit: Payer: 59

## 2015-12-22 DIAGNOSIS — R262 Difficulty in walking, not elsewhere classified: Secondary | ICD-10-CM

## 2015-12-22 DIAGNOSIS — M25672 Stiffness of left ankle, not elsewhere classified: Secondary | ICD-10-CM

## 2015-12-22 DIAGNOSIS — R29898 Other symptoms and signs involving the musculoskeletal system: Secondary | ICD-10-CM

## 2015-12-22 DIAGNOSIS — M25572 Pain in left ankle and joints of left foot: Secondary | ICD-10-CM

## 2015-12-22 DIAGNOSIS — M259 Joint disorder, unspecified: Secondary | ICD-10-CM | POA: Diagnosis not present

## 2015-12-22 NOTE — Patient Instructions (Addendum)
Discussed anatomy with  pronation and need for arch support probably long term due to multiple surgery of LT foot.

## 2015-12-22 NOTE — Therapy (Signed)
[place], [place], [address] Phone: [phone]   Fax:  [phone]  Physical Therapy Treatment  Patient Details  Name: Amy Adams MRN: [id number] Date of Birth: [birth year] Referring Provider: Tyson Dense, DPM  Encounter Date: 12/22/2015      PT End of Session - 12/22/15 1318    Visit Number 7   Number of Visits 12   Date for PT Re-Evaluation 01/12/16   PT Start Time 1230   PT Stop Time 1315   PT Time Calculation (min) 45 min   Activity Tolerance Patient tolerated treatment well   Behavior During Therapy [facility] for tasks assessed/performed      Past Medical History  Diagnosis Date  . Diabetes mellitus without complication ([place])   . Arthritis   . Anxiety   . History of migraine headaches   . Thyroid disease   . Hyperlipidemia   . Hypertension     Past Surgical History  Procedure Laterality Date  . Metatarsal osteotomy Left   . Hammer toe surgery Left   . Endoscopic plantar fasciotomy Left   . Phenol matrixectomy Right     1st toenail  . Abdominal hysterectomy    . Carpal tunnel release    . Tonsillectomy    . Cesarean section      There were no vitals filed for this visit.  Visit Diagnosis:  Stiffness of ankle joint, left  Ankle weakness  Pain in joint, ankle and foot, left  Difficulty walking          [facility] PT Assessment - 12/22/15 1235    Assessment   Medical Diagnosis LT peroneal and posterior tendon repair    AROM   Left Ankle Dorsiflexion 105   Left Ankle Plantar Flexion --  She has full ROM PF   Left Ankle Inversion 41   Left Ankle Eversion 23   Strength   Left Ankle Dorsiflexion 5/5   Left Ankle Plantar Flexion 4/5   Left Ankle Inversion 5/5   Left Ankle Eversion 5/5                     [facility] Adult PT Treatment/Exercise - 12/22/15 1233    Balance Poses: Yoga   Tree Pose 3 reps  best time LT 7 sec  RT 15 sec   Ankle Exercises: Aerobic   Stationary Bike L3 6 min   Ankle Exercises: Stretches   Plantar Fascia Stretch 2 reps;30 seconds   Soleus Stretch 2 reps;30 seconds   Gastroc Stretch 2 reps;30 seconds   Ankle Exercises: Seated   Other Seated Ankle Exercises Green t band 4 way x 15   Ankle Exercises: Standing   BAPS Level 2;Standing;15 reps  LT clockwise   Braiding (Round Trip) 20 feet RT and LT x 2                PT Education - 12/22/15 1317    Education provided Yes   Education Details Arch anatomy with illustration and how muscle and passive connective tissue support arch   Person(s) Educated Patient   Methods Explanation   Comprehension Verbalized understanding          PT Short Term Goals - 12/22/15 1259    PT SHORT TERM GOAL #1   Title She will be independent with intial HEP   PT SHORT TERM GOAL #2   Title She will report pain decr  40% or more  with walking  Baseline 60%   Status Achieved   PT SHORT TERM GOAL #3   Title She will increase walking without boot for 2-3 hours per day   Status Achieved   PT SHORT TERM GOAL #4   Title She will improve active ROM equal to RT foot/ankle   Status Achieved           PT Long Term Goals - 12/22/15 1300    PT LONG TERM GOAL #1   Title she will be independent with all HEP issued as of last visit   Status On-going   PT LONG TERM GOAL #2   Title she will be walking without boot 100% for home activity   Status Achieved   PT LONG TERM GOAL #3   Title she will return to work without boot. , normal foot wear   Status Achieved   PT LONG TERM GOAL #4   Title She will report no pain sitting with leg dependent   Status On-going   PT LONG TERM GOAL #5   Title Walk in store 60 min or more to shop normally   Time 3   Period Weeks   Status New               Plan - 12/22/15 1319    Clinical Impression Statement She is doing well with normal ROM and good to normal stength. Her tolerance to PF and single leg weight bearing is much improved  but still incr discomfort with 15-20 sec of pressure . Cont with pain and swelling with 30-45 min on feet.  declined ice . She will do at work   PT Next Visit Plan Continue strength, balance STW, cold as needed, standing arch lifts   Consulted and Agree with Plan of Care Patient        Problem List Patient Active Problem List   Diagnosis Date Noted  . HYPOTHYROIDISM 12/01/2010  . DM 12/01/2010  . 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 OTHER AND UNSPECIFIED HYPERLIPIDEMIA 12/01/2010  . OBESITY, UNSPECIFIED 12/01/2010  . ESSENTIAL HYPERTENSION, BENIGN 12/01/2010  . RHEUMATOID ARTHRITIS 12/01/2010    Darrel Hoover PT   12/22/2015, 1:22 PM  [facility] [address] [place], [place], [address] Phone: [phone]   Fax:  [phone]  Name: Amy Adams MRN: [id number] Date of Birth: [birth year]

## 2015-12-24 ENCOUNTER — Encounter: Payer: 59 | Attending: Endocrinology | Admitting: *Deleted

## 2015-12-24 ENCOUNTER — Encounter: Payer: Self-pay | Admitting: *Deleted

## 2015-12-24 DIAGNOSIS — E1142 Type 2 diabetes mellitus with diabetic polyneuropathy: Secondary | ICD-10-CM | POA: Insufficient documentation

## 2015-12-24 DIAGNOSIS — Z794 Long term (current) use of insulin: Secondary | ICD-10-CM | POA: Insufficient documentation

## 2015-12-24 DIAGNOSIS — Z713 Dietary counseling and surveillance: Secondary | ICD-10-CM | POA: Insufficient documentation

## 2015-12-24 DIAGNOSIS — E119 Type 2 diabetes mellitus without complications: Secondary | ICD-10-CM

## 2015-12-24 NOTE — Patient Instructions (Signed)
Plan: Consider starting back with 15 minutes activity and increase as tolerated by 5 minutes every week or two You plan to stop at gym on your way home from work around 5 PM, since it will have been several hours since lunch, plan to have a carb choice with protein and no insulin prior to exercise Check BG before and after exercise for the first couple of weeks Drink water before and after exercise Continue taking 3 units for every Carb Choice (= 1 unit for every 5 grams) Consider using fast acting carbohydrate first when treating a low BG  Such as 4 pieces sugar candy, 4 oz regular soda or fruit juice or 4 glucose tablets, then follow with starch and protein

## 2015-12-24 NOTE — Progress Notes (Signed)
Appt start time: 0730 end time:  0800.  Assessment:  Patient was seen on  12/24/15 for individual diabetes education follow up. She has had recent foot surgery and was on bed rest for 2 months. She sates her BG during recovery of surgery averaged 120-150 mg/dl! She is currently getting physical therapy 3 days a week just before lunch and is having symptoms of hypoglycemia after that. Planning to go back to the gym in 2 weeks and she wants guidance on BG management and prevention of low BG with this increase in activity level. She states have a Carb raio of 3 units per carb choice ( = 1 unit / 5 grams carbohydrate) has helped stabilize her BG quite a bit. She would like to continue with that plan. When she resumes going to the gym it will be after work, between 5 and 6 PM (before supper).  Current HbA1c: not available  Preferred Learning Style: Hands on  Learning Readiness:   Ready  Change in progress  MEDICATIONS: see list, diabetes medications are Humalog, Lantus and Metformin  DIETARY INTAKE:  24-hr recall:  B ( AM): fast food biscuit with egg,and Active drink (flavored water) or diet soda  Snk ( AM): lean meat and cheese, fresh fruit around 11 AM L ( PM): left overs from home, varies, protein, smaller serving of starch, vegetables, diet soda or water Snk ( PM): pretzels OR fresh fruit OR Nutrigrain bar, no more candy D ( PM): meat, vegetable and small serving of starch,  Snk ( PM): not usually due to later supper Beverages: water, Fit and Active flavored water, 2% milk  Usual physical activity: none right now due to recent foot surgery and recent injury to this foot.   TANITA  BODY COMP RESULTS 02/12/14  Weight 269.0 lb   BMI (kg/m^2) 39.7   Fat Mass (lbs) 142.0 lb   Fat Free Mass (lbs) 127.0 lb   Total Body Water (lbs) 93.0 lb   TANITA  BODY COMP RESULTS 02/27/14  Weight 274.0 lb   BMI (kg/m^2) 40.5   Fat Mass (lbs) 142.5 lb   Fat Free Mass (lbs) 131.5 lb   Total Body Water  (lbs) 96.5 lb   TANITA  BODY COMP RESULTS 03/12/14  Weight 272.5 lb   BMI (kg/m^2) 40.2   Fat Mass (lbs) 144.0 lb   Fat Free Mass (lbs) 128.5 lb   Total Body Water (lbs) 94.0 lb    TANITA  BODY COMP RESULTS 06/05/14  Weight 263.7 lb   BMI (kg/m^2) 39.5   Fat Mass (lbs) 142.0 lb   Fat Free Mass (lbs) 125.5 lb   Total Body Water (lbs) 92.0 lb   TANITA  BODY COMP RESULTS 09/11/15  Weight 262.5   BMI (kg/m^2) 38.8   Fat Mass (lbs) 140.0   Fat Free Mass (lbs) 122.5   Total Body Water (lbs) 89.5   We did not weigh today, too difficult to take her boot off of her foot, and visit plan is for BG control today.  Estimated energy needs: 1600 calories 180 g carbohydrates 120 g protein 44 g fat  Progress Towards Goal(s):  In progress.   Nutritional Diagnosis:  NI-1.5 Excessive energy intake As related to activity level.  As evidenced by BMI of 40.2, now down to 38.8. (will reassess at future visit)    Intervention:  Nutrition counseling continued. Based on plan to resume exercise and desire to prevent hypoglycemia, we discussed prevention with snacks prior to exercise and  treatment with simple sugar first, followed by starch and protein to maintain BG after exercise as needed. I also suggested she start with 15 minutes of activity and increase in 5 minute increments every 1-2 weeks as tolerated. She is to check BG pre and post exercise for the first couple of weeks to determine affect of the exercise on her BG.  Plan: Consider starting back with 15 minutes activity and increase as tolerated by 5 minutes every week or two You plan to stop at gym on your way home from work around 5 PM, since it will have been several hours since lunch, plan to have a carb choice with protein and no insulin prior to exercise Check BG before and after exercise for the first couple of weeks Drink water before and after exercise Continue taking 3 units for every Carb Choice (= 1 unit for every 5  grams) Consider using fast acting carbohydrate first when treating a low BG  Such as 4 pieces sugar candy, 4 oz regular soda or fruit juice or 4 glucose tablets, then follow with starch and protein      Teaching Method Utilized: Visual, Auditory and Hands on  Handouts given during visit include:  No new handouts today  Barriers to learning/adherence to lifestyle change: recent foot surgery has limited any activity  Diabetes self-care support plan:   [facility] support group  Co-workers and friends   Demonstrated degree of understanding via:  Teach Back   Monitoring/Evaluation:  Dietary intake, exercise, SMBG, prevention of hypoglycemia and body weight in 1 month

## 2015-12-25 ENCOUNTER — Ambulatory Visit: Payer: 59

## 2015-12-25 DIAGNOSIS — R29898 Other symptoms and signs involving the musculoskeletal system: Secondary | ICD-10-CM

## 2015-12-25 DIAGNOSIS — M259 Joint disorder, unspecified: Secondary | ICD-10-CM | POA: Diagnosis not present

## 2015-12-25 DIAGNOSIS — R262 Difficulty in walking, not elsewhere classified: Secondary | ICD-10-CM

## 2015-12-25 DIAGNOSIS — M25672 Stiffness of left ankle, not elsewhere classified: Secondary | ICD-10-CM

## 2015-12-25 DIAGNOSIS — M25572 Pain in left ankle and joints of left foot: Secondary | ICD-10-CM | POA: Diagnosis not present

## 2015-12-25 NOTE — Therapy (Signed)
[place], [place], [address] Phone: [phone]   Fax:  [phone]  Physical Therapy Treatment  Patient Details  Name: Amy Adams MRN: [id number] Date of Birth: 1966-06-23 Referring Provider: Tyson Dense, DPM  Encounter Date: 12/25/2015      PT End of Session - 12/25/15 1141    Visit Number 8   Number of Visits 12   Date for PT Re-Evaluation 01/12/16   PT Start Time 1100   PT Stop Time 1140   PT Time Calculation (min) 40 min   Activity Tolerance Patient tolerated treatment well   Behavior During Therapy [facility] for tasks assessed/performed      Past Medical History  Diagnosis Date  . Diabetes mellitus without complication ([place])   . Arthritis   . Anxiety   . History of migraine headaches   . Thyroid disease   . Hyperlipidemia   . Hypertension     Past Surgical History  Procedure Laterality Date  . Metatarsal osteotomy Left   . Hammer toe surgery Left   . Endoscopic plantar fasciotomy Left   . Phenol matrixectomy Right     1st toenail  . Abdominal hysterectomy    . Carpal tunnel release    . Tonsillectomy    . Cesarean section      There were no vitals filed for this visit.  Visit Diagnosis:  Stiffness of ankle joint, left  Ankle weakness  Pain in joint, ankle and foot, left  Difficulty walking     Using reformer she did series of foot/ankle exer for PF with hip neutral /IR/ ER  repetitions 15-20 bilateral and single leg.   Bike 6 min L3  Arch lifts both and single in standing 15020 reps eac              [facility] Adult PT Treatment/Exercise - 12/25/15 1056    Manual Therapy   Soft tissue mobilization STM to L foot medial arch, scar massage, plantar fascia    Ankle Exercises: Supine   Other Supine Ankle Exercises Reformer PF x 15 feet straight from arcn and metatarsals then x 15 with feet ER and IR eac                PT Education - 12/25/15 1146    Education provided Yes   Education Details arch lifts in standing   Person(s) Educated Patient   Methods Explanation;Demonstration;Tactile cues;Verbal cues   Comprehension Verbalized understanding          PT Short Term Goals - 12/22/15 1259    PT SHORT TERM GOAL #1   Title She will be independent with intial HEP   PT SHORT TERM GOAL #2   Title She will report pain decr  40% or more  with walking   Baseline 60%   Status Achieved   PT SHORT TERM GOAL #3   Title She will increase walking without boot for 2-3 hours per day   Status Achieved   PT SHORT TERM GOAL #4   Title She will improve active ROM equal to RT foot/ankle   Status Achieved           PT Long Term Goals - 12/22/15 1300    PT LONG TERM GOAL #1   Title she will be independent with all HEP issued as of last visit   Status On-going   PT LONG TERM GOAL #2   Title she will be walking without boot 100% for home  activity   Status Achieved   PT LONG TERM GOAL #3   Title she will return to work without boot. , normal foot wear   Status Achieved   PT LONG TERM GOAL #4   Title She will report no pain sitting with leg dependent   Status On-going   PT LONG TERM GOAL #5   Title Walk in store 60 min or more to shop normally   Time 3   Period Weeks   Status New               Plan - 12/25/15 1141    Clinical Impression Statement MS Safran was sensitive today but this improved with use of Rockblade and STW to foot and scar medially. She was able to raise LT arch in standing with only mild discomfort x 12 and mild discomfort with PF exercise so her tolerance is improvoing . Arch may improve tolerance with arch support.  She wanted to come 1x nest week, have orthotic in shoe and return after that for assessment of fit.     PT Next Visit Plan Continue strength, balance STW, cold as needed, manual if needed   Consulted and Agree with Plan of Care Patient        Problem List Patient Active Problem List    Diagnosis Date Noted  . HYPOTHYROIDISM 12/01/2010  . DM 12/01/2010  . 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 OTHER AND UNSPECIFIED HYPERLIPIDEMIA 12/01/2010  . OBESITY, UNSPECIFIED 12/01/2010  . ESSENTIAL HYPERTENSION, BENIGN 12/01/2010  . RHEUMATOID ARTHRITIS 12/01/2010    Darrel Hoover PT 12/25/2015, 11:47 AM  [facility] [address] [place], [place], [address] Phone: [phone]   Fax:  [phone]  Name: Amy Adams MRN: [id number] Date of Birth: Mar 25, 1966

## 2015-12-25 NOTE — Patient Instructions (Signed)
She will do 10-20 reps of bilateral arch lifts and LT single leg arch lift x 10-15 reps daily.

## 2015-12-29 ENCOUNTER — Ambulatory Visit: Payer: 59

## 2015-12-29 DIAGNOSIS — M25672 Stiffness of left ankle, not elsewhere classified: Secondary | ICD-10-CM | POA: Diagnosis not present

## 2015-12-29 DIAGNOSIS — M5136 Other intervertebral disc degeneration, lumbar region: Secondary | ICD-10-CM | POA: Diagnosis not present

## 2015-12-29 DIAGNOSIS — M150 Primary generalized (osteo)arthritis: Secondary | ICD-10-CM | POA: Diagnosis not present

## 2015-12-29 DIAGNOSIS — M25572 Pain in left ankle and joints of left foot: Secondary | ICD-10-CM | POA: Diagnosis not present

## 2015-12-29 DIAGNOSIS — R262 Difficulty in walking, not elsewhere classified: Secondary | ICD-10-CM

## 2015-12-29 DIAGNOSIS — M259 Joint disorder, unspecified: Secondary | ICD-10-CM | POA: Diagnosis not present

## 2015-12-29 DIAGNOSIS — R29898 Other symptoms and signs involving the musculoskeletal system: Secondary | ICD-10-CM

## 2015-12-29 DIAGNOSIS — M0589 Other rheumatoid arthritis with rheumatoid factor of multiple sites: Secondary | ICD-10-CM | POA: Diagnosis not present

## 2015-12-29 DIAGNOSIS — Z79899 Other long term (current) drug therapy: Secondary | ICD-10-CM | POA: Diagnosis not present

## 2015-12-29 NOTE — Therapy (Signed)
[place], [place], [address] Phone: [phone]   Fax:  [phone]  Physical Therapy Treatment  Patient Details  Name: Amy Adams MRN: [id number] Date of Birth: December 31, 1965 Referring Provider: Tyson Dense, DPM  Encounter Date: 12/29/2015      PT End of Session - 12/29/15 1322    Visit Number 9   Number of Visits 12   Date for PT Re-Evaluation 01/12/16   PT Start Time 1230   PT Stop Time 1314   PT Time Calculation (min) 44 min   Activity Tolerance Patient tolerated treatment well   Behavior During Therapy [facility] for tasks assessed/performed      Past Medical History  Diagnosis Date  . Diabetes mellitus without complication ([place])   . Arthritis   . Anxiety   . History of migraine headaches   . Thyroid disease   . Hyperlipidemia   . Hypertension     Past Surgical History  Procedure Laterality Date  . Metatarsal osteotomy Left   . Hammer toe surgery Left   . Endoscopic plantar fasciotomy Left   . Phenol matrixectomy Right     1st toenail  . Abdominal hysterectomy    . Carpal tunnel release    . Tonsillectomy    . Cesarean section      There were no vitals filed for this visit.  Visit Diagnosis:  Stiffness of ankle joint, left  Ankle weakness  Pain in joint, ankle and foot, left  Difficulty walking      Subjective Assessment - 12/29/15 1233    Subjective She reported increased pain over weekend though no increased pain post last session until later that night.    Currently in Pain? Yes   Pain Score 2    Pain Location Foot   Pain Orientation Left   Pain Descriptors / Indicators Aching   Pain Type Surgical pain   Pain Onset More than a month ago   Pain Frequency Intermittent   Multiple Pain Sites No                         [facility] Adult PT Treatment/Exercise - 12/29/15 1235    Ambulation/Gait   Gait Comments She is now walking  essentially normal   Neuro  Re-ed    Neuro Re-ed Details  Single leg balance 20 reps LT to tolefrance with longest time at 8 sec. No significant ingr pain post. Gait essentially same on leaving   Ankle Exercises: Aerobic   Stationary Bike L3 6 min   Ankle Exercises: Stretches   Soleus Stretch 2 reps;30 seconds   Gastroc Stretch 2 reps;30 seconds   Ankle Exercises: Machines for Strengthening   Cybex Leg Press Leg press Omega machine with PF  15 pounds x 12, 20 pounds x10 RT and LT , 25 pds. RT and LT 10 reps , 30 pounds RT and LT x5 35 pounds x5 RT nd LT  45 pounds x5 RT and LT.    Ankle Exercises: Standing   BAPS Level 2;Standing;15 reps   Other Standing Ankle Exercises Arch lifts x 15                  PT Short Term Goals - 12/29/15 1325    PT SHORT TERM GOAL #1   Title She will be independent with intial HEP   Status Achieved   PT SHORT TERM GOAL #2   Title She will report pain  decr  40% or more  with walking   Status Achieved   PT SHORT TERM GOAL #3   Title She will increase walking without boot for 2-3 hours per day   Status Achieved   PT SHORT TERM GOAL #4   Title She will improve active ROM equal to RT foot/ankle   Status Achieved           PT Long Term Goals - 12/29/15 1325    PT LONG TERM GOAL #1   Title she will be independent with all HEP issued as of last visit   Status On-going   PT LONG TERM GOAL #2   Title she will be walking without boot 100% for home activity   Status Achieved   PT LONG TERM GOAL #3   Title she will return to work without boot. , normal foot wear   Status Achieved   PT LONG TERM GOAL #4   Title She will report no pain sitting with leg dependent   Status On-going   PT LONG TERM GOAL #5   Title Walk in store 60 min or more to shop normally   Status On-going               Plan - 12/29/15 1322    Clinical Impression Statement Her tolerance for pressure on LT foot is significantly improved. She may have had incr pain from increased PF exercise on  regformer. She did not complain of any significnat incr pain with PF on leg press but will monitor pain level and adjust exercise as needed. Held STW to be sure if exercise  cause last incr pain   PT Next Visit Plan Continue strength, balance STW, cold as needed, manual if needed   Consulted and Agree with Plan of Care Patient        Problem List Patient Active Problem List   Diagnosis Date Noted  . HYPOTHYROIDISM 12/01/2010  . DM 12/01/2010  . 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 OTHER AND UNSPECIFIED HYPERLIPIDEMIA 12/01/2010  . OBESITY, UNSPECIFIED 12/01/2010  . ESSENTIAL HYPERTENSION, BENIGN 12/01/2010  . RHEUMATOID ARTHRITIS 12/01/2010    Darrel Hoover PT 12/29/2015, 1:26 PM  [facility] [address] [place], [place], [address] Phone: [phone]   Fax:  [phone]  Name: Amy Adams MRN: [id number] Date of Birth: 09/28/1966

## 2016-01-01 ENCOUNTER — Other Ambulatory Visit: Payer: Self-pay | Admitting: Podiatry

## 2016-01-01 ENCOUNTER — Ambulatory Visit: Payer: 59 | Admitting: *Deleted

## 2016-01-01 DIAGNOSIS — M779 Enthesopathy, unspecified: Secondary | ICD-10-CM

## 2016-01-01 MED FILL — ESTRADIOL 0.5 MG TABLET: 0.5 | 90 days supply | Qty: 45 | Fill #1

## 2016-01-01 MED FILL — ALPRAZolam 1 MG TABS: 1 | 90 days supply | Qty: 270 | Fill #1

## 2016-01-01 MED FILL — HUMALOG 100 UNITS/ML KWIKPE: 100 | 90 days supply | Qty: 90 | Fill #3

## 2016-01-01 MED FILL — HYDROXYCHLOROQUINE 200 MG T: 200 | 90 days supply | Qty: 180 | Fill #1

## 2016-01-01 MED FILL — LANTUS SOLOSTAR 100 UNITS/M: 100 | 90 days supply | Qty: 135 | Fill #3

## 2016-01-01 MED FILL — METHOTREXATE 25 MG/ML VIAL: 50 | 90 days supply | Qty: 12 | Fill #1

## 2016-01-01 MED FILL — ZOLPIDEM TART ER 12.5 MG TA: 12.5 | 90 days supply | Qty: 90 | Fill #1

## 2016-01-01 MED FILL — GABAPENTIN 400 MG CAPSULE: 400 | 90 days supply | Qty: 270 | Fill #0

## 2016-01-01 MED FILL — LEVOTHYROXINE 50 MCG TABLET: 50 | 90 days supply | Qty: 90 | Fill #2

## 2016-01-01 MED FILL — FOLIC ACID 1 MG TABLET: 1 | 90 days supply | Qty: 180 | Fill #1

## 2016-01-01 NOTE — Progress Notes (Signed)
Patient ID: Amy Adams, female   DOB: 1966-01-22, 50 y.o.   MRN: [id number] Patient presents for orthotic pick up.  Verbal and written break in and wear instructions given.  Patient will follow up in 4 weeks if symptoms worsen or fail to improve.

## 2016-01-01 NOTE — Patient Instructions (Signed)

## 2016-01-04 MED FILL — BD SINGLE USE SWAB: 90 days supply | Qty: 400 | Fill #0

## 2016-01-05 ENCOUNTER — Ambulatory Visit: Payer: 59

## 2016-01-05 MED FILL — IBUPROFEN 800 MG TABLET: 800 | 30 days supply | Qty: 90 | Fill #1

## 2016-01-06 MED FILL — BD PEN NDL SHORT 31GX5/16: 31G X 8 MM | 90 days supply | Qty: 400 | Fill #0

## 2016-01-06 MED FILL — VENTOLIN HFA 90 MCG INHALER: 108 (90 BAS | 75 days supply | Qty: 54 | Fill #0

## 2016-01-11 DIAGNOSIS — E1142 Type 2 diabetes mellitus with diabetic polyneuropathy: Secondary | ICD-10-CM | POA: Diagnosis not present

## 2016-01-11 DIAGNOSIS — F419 Anxiety disorder, unspecified: Secondary | ICD-10-CM | POA: Diagnosis not present

## 2016-01-11 DIAGNOSIS — E039 Hypothyroidism, unspecified: Secondary | ICD-10-CM | POA: Diagnosis not present

## 2016-01-11 DIAGNOSIS — M069 Rheumatoid arthritis, unspecified: Secondary | ICD-10-CM | POA: Diagnosis not present

## 2016-01-11 DIAGNOSIS — F329 Major depressive disorder, single episode, unspecified: Secondary | ICD-10-CM | POA: Diagnosis not present

## 2016-01-11 DIAGNOSIS — K219 Gastro-esophageal reflux disease without esophagitis: Secondary | ICD-10-CM | POA: Diagnosis not present

## 2016-01-11 DIAGNOSIS — G4700 Insomnia, unspecified: Secondary | ICD-10-CM | POA: Diagnosis not present

## 2016-01-11 DIAGNOSIS — E782 Mixed hyperlipidemia: Secondary | ICD-10-CM | POA: Diagnosis not present

## 2016-01-11 DIAGNOSIS — I10 Essential (primary) hypertension: Secondary | ICD-10-CM | POA: Diagnosis not present

## 2016-01-11 MED FILL — TRIAMCINOLONE 0.1% CREAM: 0.1 | 10 days supply | Qty: 30 | Fill #0

## 2016-01-11 MED FILL — CLARITHROMYCIN 500 MG TAB: 500 | 10 days supply | Qty: 20 | Fill #0

## 2016-01-11 MED FILL — valACYclovir HCL 1 GM TABS: 1 | 7 days supply | Qty: 30 | Fill #0

## 2016-01-13 ENCOUNTER — Ambulatory Visit: Payer: 59 | Attending: Family Medicine

## 2016-01-13 DIAGNOSIS — M25572 Pain in left ankle and joints of left foot: Secondary | ICD-10-CM

## 2016-01-13 DIAGNOSIS — M259 Joint disorder, unspecified: Secondary | ICD-10-CM | POA: Insufficient documentation

## 2016-01-13 DIAGNOSIS — R29898 Other symptoms and signs involving the musculoskeletal system: Secondary | ICD-10-CM

## 2016-01-13 DIAGNOSIS — R262 Difficulty in walking, not elsewhere classified: Secondary | ICD-10-CM | POA: Diagnosis not present

## 2016-01-13 NOTE — Therapy (Signed)
[place], [place], [address] Phone: [phone]   Fax:  [phone]  Physical Therapy Treatment  Patient Details  Name: Amy Adams MRN: [id number] Date of Birth: [birth year] Referring Provider: Tyson Dense, DPM  Encounter Date: 01/13/2016      PT End of Session - 01/13/16 1133    Visit Number 10   PT Start Time 1100   PT Stop Time 1140   PT Time Calculation (min) 40 min   Activity Tolerance Patient tolerated treatment well;No increased pain   Behavior During Therapy [facility] for tasks assessed/performed      Past Medical History  Diagnosis Date  . Diabetes mellitus without complication ([place])   . Arthritis   . Anxiety   . History of migraine headaches   . Thyroid disease   . Hyperlipidemia   . Hypertension     Past Surgical History  Procedure Laterality Date  . Metatarsal osteotomy Left   . Hammer toe surgery Left   . Endoscopic plantar fasciotomy Left   . Phenol matrixectomy Right     1st toenail  . Abdominal hysterectomy    . Carpal tunnel release    . Tonsillectomy    . Cesarean section      There were no vitals filed for this visit.  Visit Diagnosis:  Ankle weakness  Pain in joint, ankle and foot, left  Difficulty walking      Subjective Assessment - 01/13/16 1106    Currently in Pain? Yes   Pain Score 4    Pain Location Ankle   Pain Orientation Left   Pain Descriptors / Indicators Sore   Pain Type Surgical pain   Pain Onset More than a month ago   Pain Frequency Intermittent   Aggravating Factors  walk /standing , orthoticss   Pain Relieving Factors rest med elevation   Multiple Pain Sites No            [facility] PT Assessment - 01/13/16 0001    AROM   Left Ankle Dorsiflexion 109   Left Ankle Plantar Flexion 70   Left Ankle Inversion 45   Left Ankle Eversion 23   Strength   Right Ankle Dorsiflexion 5/5   Right Ankle Plantar Flexion 4+/5   Right Ankle Inversion  5/5   Right Ankle Eversion 5/5                     [facility] Adult PT Treatment/Exercise - 01/13/16 1109    Ankle Exercises: Aerobic   Stationary Bike L3 5 min   Ankle Exercises: Standing   BAPS Standing;Level 3;15 reps;Other (comment)  clock and counter clockwise   Heel Raises 20 reps   Ankle Exercises: Machines for Strengthening   Cybex Leg Press Leg press Omega machine with PF  15 pounds x 12, 20 pounds x10 RT and LT , 25 pds. RT and LT 10 reps , 30 pounds RT and LT x5 35 pounds x10 RT nd LT  45 pounds x5 RT and LT.    Ankle Exercises: Stretches   Gastroc Stretch 1 rep;30 seconds                PT Education - 01/13/16 1241    Education provided Yes   Education Details Progression to gym program    Person(s) Educated Patient   Methods Explanation   Comprehension Verbalized understanding          PT Short Term Goals - 12/29/15 1325  PT SHORT TERM GOAL #1   Title She will be independent with intial HEP   Status Achieved   PT SHORT TERM GOAL #2   Title She will report pain decr  40% or more  with walking   Status Achieved   PT SHORT TERM GOAL #3   Title She will increase walking without boot for 2-3 hours per day   Status Achieved   PT SHORT TERM GOAL #4   Title She will improve active ROM equal to RT foot/ankle   Status Achieved           PT Long Term Goals - 01/13/16 1143    PT LONG TERM GOAL #1   Title she will be independent with all HEP issued as of last visit   Status Achieved   PT LONG TERM GOAL #2   Title she will be walking without boot 100% for home activity   Status Achieved   PT LONG TERM GOAL #3   Title she will return to work without boot. , normal foot wear   Status Achieved   PT LONG TERM GOAL #4   Title She will report no pain sitting with leg dependent   Status Partially Met   PT LONG TERM GOAL #5   Title Walk in store 60 min or more to shop normally   Status Partially Met               Plan - 01/13/16 1140     Clinical Impression Statement Ms Vickers was ready for discharge today and plans to return to gym in next week She feel ready to do own rehab and  feels she can manage he rpain. She was cautioned on return to gym she start at lowest levels so that she feels she has not worked hard to acclimate her body and foot to the exercise routine.    PT Next Visit Plan Discharge this visit   PT Home Exercise Plan REturn to gym and continue HEP    Consulted and Agree with Plan of Care Patient        Problem List Patient Active Problem List   Diagnosis Date Noted  . HYPOTHYROIDISM 12/01/2010  . DM 12/01/2010  . 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 OTHER AND UNSPECIFIED HYPERLIPIDEMIA 12/01/2010  . OBESITY, UNSPECIFIED 12/01/2010  . ESSENTIAL HYPERTENSION, BENIGN 12/01/2010  . RHEUMATOID ARTHRITIS 12/01/2010    Darrel Hoover PT 01/13/2016, 12:42 PM  [facility] [address] [place], [place], [address] Phone: [phone]   Fax:  [phone]  Name: Amy Adams MRN: [id number] Date of Birth: [birth year]    PHYSICAL THERAPY DISCHARGE SUMMARY  Visits from Start of Care: 10  Current functional level related to goals / functional outcomes: See above   Remaining deficits: PF weakness, pain LT foot   Education / Equipment: HEP Plan: Patient agrees to discharge.  Patient goals were partially met. Patient is being discharged due to being pleased with the current functional level.  ?????

## 2016-01-13 NOTE — Patient Instructions (Signed)
We discussed return to gym and how to start and build without injury . Ice post as needed.

## 2016-01-20 DIAGNOSIS — J019 Acute sinusitis, unspecified: Secondary | ICD-10-CM | POA: Diagnosis not present

## 2016-01-20 DIAGNOSIS — H6980 Other specified disorders of Eustachian tube, unspecified ear: Secondary | ICD-10-CM | POA: Diagnosis not present

## 2016-01-20 MED FILL — AZELASTINE 0.15% NASAL SPRY: 0.15 | 50 days supply | Qty: 30 | Fill #0

## 2016-01-27 ENCOUNTER — Telehealth: Payer: Self-pay | Admitting: *Deleted

## 2016-01-27 NOTE — Telephone Encounter (Signed)
Pt states she has completed the PT, but continues to have pain, swelling and lumps along the side, and take Tylenol and Ibuprofen every night just to rest.  Pt asked if she needed an appt.  I told pt I felt it would be to her benefit to get an appt and transferred to schedulers.

## 2016-01-28 ENCOUNTER — Encounter: Payer: Self-pay | Admitting: Podiatry

## 2016-01-28 ENCOUNTER — Ambulatory Visit (INDEPENDENT_AMBULATORY_CARE_PROVIDER_SITE_OTHER): Payer: 59 | Admitting: Podiatry

## 2016-01-28 VITALS — BP 145/96 | HR 90 | Resp 12

## 2016-01-28 DIAGNOSIS — M7750 Other enthesopathy of unspecified foot: Secondary | ICD-10-CM

## 2016-01-28 DIAGNOSIS — M6588 Other synovitis and tenosynovitis, other site: Secondary | ICD-10-CM | POA: Diagnosis not present

## 2016-01-28 MED FILL — LEVOCETIRIZINE 5 MG TABLET: 5 | 90 days supply | Qty: 90 | Fill #0

## 2016-01-28 MED FILL — traMADol HCL 50 MG TABS: 50 | 90 days supply | Qty: 270 | Fill #0

## 2016-01-28 MED FILL — MONTELUKAST SOD 10 MG TAB: 10 | 90 days supply | Qty: 90 | Fill #0

## 2016-01-28 NOTE — Progress Notes (Signed)
She presents today to complaint of continued pain to the posterior tibial tendon date of surgery was 09/25/2015. She states that even after physical therapy and feels better but is still painful and swells throughout the day.  Objective: Evaluation of the left lower extremity does demonstrate swelling and fibrosis around the inferior portion of the incision site with tenderness and fluid collection. I feel that there is still some tendinitis present. There is more than likely locked up and scar tissue.  Assessment: Posterior tibial tendinitis.  Plan: Injected the area today beneath the level of the posterior tibial tendon following the surgical site. I will follow-up with her in 1 month

## 2016-02-02 ENCOUNTER — Ambulatory Visit: Payer: 59

## 2016-02-05 ENCOUNTER — Other Ambulatory Visit: Payer: Self-pay

## 2016-02-05 VITALS — BP 144/86 | HR 86 | Resp 16 | Ht 69.0 in | Wt 266.6 lb

## 2016-02-05 DIAGNOSIS — E118 Type 2 diabetes mellitus with unspecified complications: Secondary | ICD-10-CM

## 2016-02-05 DIAGNOSIS — Z794 Long term (current) use of insulin: Secondary | ICD-10-CM

## 2016-02-05 NOTE — Patient Instructions (Signed)
1. Plan to eat 30-45 GM (2-3) servings of carbohydrate a meal and 15 GM for snacks.  Plan to eat protein with your snacks 2. Plan to find glucometer this weekend or go to pharmacy to get another meter 3. Plan to check blood sugar 2-3 times a day fasting or 1 -2hrs after a meal.  Goals of 80-130 fasting and 180 or less after eating. 4. Plan to continue to do upper body exercises 3 times a week 5. Plan to complete EMMI programs by 03/14/16 6. Plan to return to [facility] on 04/29/16 at 1PM

## 2016-02-05 NOTE — Patient Outreach (Signed)
[facility]) Care Management   02/05/2016  VON PEAN 1966-06-15 [id number]  Amy Adams is an 50 y.o. female  Member seen for follow up office visit for [facility] to Wellness program for self management of Type 2 diabetes  Subjective: Member states that her hemoglobin A1C has gone up to 7.4 when she saw Dr.Balan last.  States that she had been on activity restrictions after ankle surgery and her MD has told her to not exercise yet while she is still having pain and swelling in her foot and ankle.  States she does do chair exercises with a resistance band.  States she tries to watch her CHO intake.  States she did see the RD last month.  States her daughter has move back in with her with her 2 grandchildren.  States that her glucometer has been misplaced and she hopes to find it this weekend.  States she is having more stress since they moved in with her.  Objective:   Review of Systems  Musculoskeletal: Positive for joint pain.    Physical Exam Today's Vitals   02/05/16 1317 02/05/16 1322  BP: 144/86   Pulse: 86   Resp: 16   Height: 1.753 m (5\' 9" )   Weight: 266 lb 9.6 oz (120.929 kg)   SpO2: 97%   PainSc: 4  4    Current Medications:   Current Outpatient Prescriptions  Medication Sig Dispense Refill  . albuterol (PROVENTIL HFA;VENTOLIN HFA) 108 (90 BASE) MCG/ACT inhaler Inhale 1-2 puffs into the lungs every 6 (six) hours as needed for wheezing or shortness of breath.    . ALPRAZolam (XANAX) 1 MG tablet Take 1 mg by mouth 3 (three) times daily as needed for sleep.    Marland Kitchen amLODipine (NORVASC) 10 MG tablet Take 10 mg by mouth daily.    Marland Kitchen atorvastatin (LIPITOR) 40 MG tablet Take 40 mg by mouth daily.    . DULoxetine (CYMBALTA) 60 MG capsule Take 120 mg by mouth at bedtime.    Marland Kitchen estradiol (ESTRACE) 0.5 MG tablet Take 0.5 mg by mouth daily.    . fenofibrate micronized (LOFIBRA) 134 MG capsule Take 134 mg by mouth daily.    . folic acid (FOLVITE) 1 MG tablet Take 2  mg by mouth daily.     Marland Kitchen gabapentin (NEURONTIN) 400 MG capsule Take 1 capsule (400 mg total) by mouth 3 (three) times daily. 90 capsule 5  . hydrochlorothiazide (HYDRODIURIL) 25 MG tablet Take 25 mg by mouth daily.    . hydroxychloroquine (PLAQUENIL) 200 MG tablet Take 200 mg by mouth 2 (two) times daily.    Marland Kitchen ibuprofen (ADVIL,MOTRIN) 800 MG tablet Take 1 tablet (800 mg total) by mouth every 8 (eight) hours as needed. 30 tablet 0  . insulin glargine (LANTUS SOLOSTAR) 100 UNIT/ML injection Inject 55 Units into the skin 2 (two) times daily.     Marland Kitchen levocetirizine (XYZAL) 5 MG tablet Take 5 mg by mouth every evening.    Marland Kitchen levothyroxine (SYNTHROID, LEVOTHROID) 50 MCG tablet Take 50 mcg by mouth daily.    Marland Kitchen losartan (COZAAR) 100 MG tablet Take 100 mg by mouth daily.    . metFORMIN (GLUCOPHAGE-XR) 500 MG 24 hr tablet Take 1,000 mg by mouth 2 (two) times daily.     . methotrexate 25 MG/ML SOLN 20 mg once a week. Inject 0.8 mL once weekly at bedtime. Every Thursday    . montelukast (SINGULAIR) 10 MG tablet Take 10 mg by mouth at bedtime.    Marland Kitchen  omeprazole (PRILOSEC) 40 MG capsule Take 40 mg by mouth 2 (two) times daily.    . traMADol (ULTRAM) 50 MG tablet Take 1 tablet (50 mg total) by mouth every 8 (eight) hours as needed. (Patient taking differently: Take 50 mg by mouth 2 (two) times daily. ) 30 tablet 0  . zolpidem (AMBIEN CR) 12.5 MG CR tablet Take 12.5 mg by mouth at bedtime as needed for sleep.    . cephALEXin (KEFLEX) 500 MG capsule Take 1 capsule (500 mg total) by mouth 3 (three) times daily. (Patient not taking: Reported on 02/05/2016) 30 capsule 0  . gabapentin (NEURONTIN) 400 MG capsule TAKE 1 CAPSULE BY MOUTH 3 TIMES DAILY (Patient not taking: Reported on 02/05/2016) 270 capsule 3  . ibuprofen (ADVIL,MOTRIN) 800 MG tablet Take 1 tablet (800 mg total) by mouth every 8 (eight) hours as needed. (Patient not taking: Reported on 02/05/2016) 90 tablet 2  . insulin lispro (HUMALOG KWIKPEN) 100 UNIT/ML  injection Inject 12 Units into the skin 3 (three) times daily before meals. 12-15 units sliding scale     No current facility-administered medications for this visit.    Functional Status:   In your present state of health, do you have any difficulty performing the following activities: 02/05/2016 09/21/2015  Hearing? N N  Vision? N N  Difficulty concentrating or making decisions? N N  Walking or climbing stairs? N N  Dressing or bathing? N N  Doing errands, shopping? N N  Preparing Food and eating ? - N    Fall/Depression Screening:    PHQ 2/9 Scores 02/05/2016 12/24/2015 09/21/2015 09/21/2015 06/09/2015 04/01/2015 02/12/2014  PHQ - 2 Score 0 0 0 0 - 0 0  Exception Documentation - - - - Patient refusal - -    Assessment:   Member seen for follow up office visit for [facility] to Wellness program for self management of Type 2 diabetes.  Member is not meeting diabetes self management goal of 7 or below with last reading of 7.4.  Member has not been able to exercise and has had steroid shots in her foot.  Reports following low CHO diet and taking insulin as directed.  Member up to date with annual eye exam and dental check ups.  Plan:  Plan to eat 30-45 GM (2-3) servings of carbohydrate a meal and 15 GM for snacks.  Plan to eat protein with your snacks Plan to find glucometer this weekend or go to pharmacy to get another meter Plan to check blood sugar 2-3 times a day fasting or 1 -2hrs after a meal.  Goals of 80-130 fasting and 180 or less after eating. Plan to continue to do upper body exercises 3 times a week Plan to complete EMMI programs by 03/14/16 Plan to return to [facility] on 04/29/16 at [place] Problem One        Most Recent Value   Care Plan Problem One  Potential for elevated blood sugars related to dx of Type  2 DM   Role Documenting the Problem One  Care Management Coordinator   Care Plan for Problem One  Active   [facility] Long Term Goal (31-90 days)  Member will  decrease hemoglobin A1C to 7 or below in the next 90 days   [facility] Long Term Goal Start Date  02/05/16 [Hemoglobin A1C increased to  7.4 after surgery]   Interventions for Problem One Long Term Goal  Reviewed CHO counting and portion control, Encouraged to  do upper body exercises until she is able to go back to the gym, instructed to go to pharmacy to get new glucometer on Monday if she does not find her glucometer, Instructed to keep appointment with Dr.Balan, Reviewed blood sugar goals    Peter Garter RN, [facility] Care Management Coordinator-Link to [place] Management [phone]

## 2016-02-10 MED FILL — CONTOUR NEXT USB METER: W/DEVICE | 20 days supply | Qty: 1 | Fill #0

## 2016-03-01 ENCOUNTER — Ambulatory Visit: Payer: 59 | Admitting: Podiatry

## 2016-03-03 ENCOUNTER — Ambulatory Visit (INDEPENDENT_AMBULATORY_CARE_PROVIDER_SITE_OTHER): Payer: 59 | Admitting: Podiatry

## 2016-03-03 ENCOUNTER — Encounter: Payer: Self-pay | Admitting: Podiatry

## 2016-03-03 VITALS — BP 145/98 | HR 86 | Resp 12

## 2016-03-03 DIAGNOSIS — M6588 Other synovitis and tenosynovitis, other site: Secondary | ICD-10-CM

## 2016-03-03 DIAGNOSIS — M7750 Other enthesopathy of unspecified foot: Secondary | ICD-10-CM

## 2016-03-05 NOTE — Progress Notes (Signed)
She presents today with resolving plantar fasciitis. No tendinitis and lateral ankle issues. She has had a lateral ankle reconstruction as well as a posterior tibial tendon reconstruction. The majority of her symptoms today are associated with the distal inferior aspects of the PT tendon.  Objective: Pulses are palpable left foot. She has pain on palpation just distal and inferior to the surgical site of the posterior tibial tendon with repair to be fluid collection. This could be bursitis or seroma or insertional tendinitis.  Assessment: Posterior tibial tendinitis plantar fasciitis medial longitudinal arch.  Plan: Injected the left foot today in this area with local anesthetic and Kenalog.

## 2016-03-28 DIAGNOSIS — Z79899 Other long term (current) drug therapy: Secondary | ICD-10-CM | POA: Diagnosis not present

## 2016-03-28 DIAGNOSIS — M150 Primary generalized (osteo)arthritis: Secondary | ICD-10-CM | POA: Diagnosis not present

## 2016-03-28 DIAGNOSIS — M0589 Other rheumatoid arthritis with rheumatoid factor of multiple sites: Secondary | ICD-10-CM | POA: Diagnosis not present

## 2016-03-28 DIAGNOSIS — M79631 Pain in right forearm: Secondary | ICD-10-CM | POA: Diagnosis not present

## 2016-03-28 DIAGNOSIS — M5136 Other intervertebral disc degeneration, lumbar region: Secondary | ICD-10-CM | POA: Diagnosis not present

## 2016-04-07 ENCOUNTER — Encounter: Payer: Self-pay | Admitting: Podiatry

## 2016-04-07 ENCOUNTER — Ambulatory Visit (INDEPENDENT_AMBULATORY_CARE_PROVIDER_SITE_OTHER): Payer: 59 | Admitting: Podiatry

## 2016-04-07 VITALS — BP 151/90 | HR 85 | Resp 16

## 2016-04-07 DIAGNOSIS — R252 Cramp and spasm: Secondary | ICD-10-CM

## 2016-04-07 DIAGNOSIS — M6588 Other synovitis and tenosynovitis, other site: Secondary | ICD-10-CM

## 2016-04-07 DIAGNOSIS — E1142 Type 2 diabetes mellitus with diabetic polyneuropathy: Secondary | ICD-10-CM | POA: Diagnosis not present

## 2016-04-07 DIAGNOSIS — M722 Plantar fascial fibromatosis: Secondary | ICD-10-CM

## 2016-04-07 DIAGNOSIS — M7750 Other enthesopathy of unspecified foot: Secondary | ICD-10-CM

## 2016-04-07 MED ORDER — CYCLOBENZAPRINE HCL 10 MG PO TABS: 10.0000 mg | ORAL_TABLET | Freq: Three times a day (TID) | ORAL | Status: DC

## 2016-04-07 MED ORDER — GABAPENTIN 400 MG PO CAPS: 400.0000 mg | ORAL_CAPSULE | Freq: Three times a day (TID) | ORAL | Status: DC

## 2016-04-07 MED FILL — GABAPENTIN 400 MG CAPSULE: 400 | 90 days supply | Qty: 270 | Fill #0 | Status: TO

## 2016-04-07 MED FILL — CYCLOBENZAPRINE 10 MG TAB: 10 | 30 days supply | Qty: 90 | Fill #0 | Status: TO

## 2016-04-07 NOTE — Progress Notes (Signed)
She presents today complaining of spasms to the medial aspect of the left foot along the abductor muscle belly. She states that the pain is so severe she has to get up and walk it off and it usually happens later in the day or at night.  Objective: Vital signs are stable she is alert and oriented 3 no reproducible spasms today.  Assessment: Muscle spasm left foot status post multiple surgeries.  Plan: At this point I encouraged her to keep her sugar is tight as possible to stay well hydrated and I wrote a prescription for cyclobenzaprine to take 2-3 times a day for spasms. She will follow up with me as needed.

## 2016-04-08 DIAGNOSIS — E119 Type 2 diabetes mellitus without complications: Secondary | ICD-10-CM | POA: Diagnosis not present

## 2016-04-08 DIAGNOSIS — Z79899 Other long term (current) drug therapy: Secondary | ICD-10-CM | POA: Diagnosis not present

## 2016-04-08 DIAGNOSIS — D3142 Benign neoplasm of left ciliary body: Secondary | ICD-10-CM | POA: Diagnosis not present

## 2016-04-08 DIAGNOSIS — H40013 Open angle with borderline findings, low risk, bilateral: Secondary | ICD-10-CM | POA: Diagnosis not present

## 2016-04-12 MED FILL — ESTRADIOL 0.5 MG TABLET: 0.5 | 90 days supply | Qty: 45 | Fill #2

## 2016-04-12 MED FILL — DULoxetine HCL 60 MG CPEP: 60 | 90 days supply | Qty: 180 | Fill #0 | Status: TO

## 2016-04-12 MED FILL — FOLIC ACID 1 MG TABLET: 1 | 90 days supply | Qty: 180 | Fill #2 | Status: TO

## 2016-04-12 MED FILL — UNIFINE PENTIPS 8MM 31G: 31G X 8 MM | 90 days supply | Qty: 400 | Fill #1 | Status: TO

## 2016-04-12 MED FILL — HYDROCHLOROTHIAZIDE 25 MG T: 25 | 90 days supply | Qty: 90 | Fill #0 | Status: TO

## 2016-04-12 MED FILL — LEVOTHYROXINE 50 MCG TABLET: 50 | 90 days supply | Qty: 90 | Fill #0 | Status: TO

## 2016-04-12 MED FILL — LOSARTAN POTASSIUM 100 MG T: 100 | 90 days supply | Qty: 90 | Fill #0 | Status: TO

## 2016-04-12 MED FILL — HYDROXYCHLOROQUINE 200 MG T: 200 | 90 days supply | Qty: 180 | Fill #0 | Status: TO

## 2016-04-12 MED FILL — METHOTREXATE 25 MG/ML VIAL: 50 | 84 days supply | Qty: 10 | Fill #0 | Status: TO

## 2016-04-12 MED FILL — OMEPRAZOLE DR 40 MG CAPSULE: 40 | 90 days supply | Qty: 180 | Fill #0 | Status: TO

## 2016-04-12 MED FILL — METFORMIN HCL ER 500 MG TAB: 500 | 90 days supply | Qty: 360 | Fill #0 | Status: TO

## 2016-04-13 MED FILL — HUMALOG 100 UNITS/ML KWIKPE: 100 | 90 days supply | Qty: 90 | Fill #0 | Status: TO

## 2016-04-13 MED FILL — ALPRAZolam 1 MG TABS: 1 | 90 days supply | Qty: 270 | Fill #0 | Status: TO

## 2016-04-13 MED FILL — ZOLPIDEM TART ER 12.5 MG TA: 12.5 | 90 days supply | Qty: 90 | Fill #0

## 2016-04-13 MED FILL — FENOFIBRATE 134 MG CAPSULE: 134 | 90 days supply | Qty: 90 | Fill #0 | Status: TO

## 2016-04-13 MED FILL — LANTUS SOLOSTAR 100 UNITS/M: 100 | 90 days supply | Qty: 135 | Fill #0 | Status: TO

## 2016-04-14 ENCOUNTER — Ambulatory Visit: Payer: 59 | Admitting: Podiatry

## 2016-04-14 MED FILL — VENTOLIN HFA 90 MCG INHALER: 108 (90 BAS | 75 days supply | Qty: 54 | Fill #1

## 2016-04-28 MED FILL — traMADol HCL 50 MG TABS: 50 | 90 days supply | Qty: 270 | Fill #1

## 2016-04-28 MED FILL — MONTELUKAST SOD 10 MG TAB: 10 | 90 days supply | Qty: 90 | Fill #1

## 2016-04-28 MED FILL — IBUPROFEN 800 MG TABLET: 800 | 30 days supply | Qty: 90 | Fill #2

## 2016-04-28 MED FILL — LEVOCETIRIZINE 5 MG TABLET: 5 | 90 days supply | Qty: 90 | Fill #1

## 2016-04-29 ENCOUNTER — Ambulatory Visit: Payer: Self-pay

## 2016-05-12 ENCOUNTER — Ambulatory Visit: Payer: 59 | Admitting: Podiatry

## 2016-05-13 ENCOUNTER — Ambulatory Visit (HOSPITAL_COMMUNITY): Payer: 59

## 2016-05-19 MED FILL — CONTOUR NEXT STRIPS: 90 days supply | Qty: 400 | Fill #0

## 2016-05-23 DIAGNOSIS — E039 Hypothyroidism, unspecified: Secondary | ICD-10-CM | POA: Diagnosis not present

## 2016-05-23 DIAGNOSIS — G609 Hereditary and idiopathic neuropathy, unspecified: Secondary | ICD-10-CM | POA: Diagnosis not present

## 2016-05-23 DIAGNOSIS — E049 Nontoxic goiter, unspecified: Secondary | ICD-10-CM | POA: Diagnosis not present

## 2016-05-23 DIAGNOSIS — E7800 Pure hypercholesterolemia, unspecified: Secondary | ICD-10-CM | POA: Diagnosis not present

## 2016-05-23 DIAGNOSIS — E1165 Type 2 diabetes mellitus with hyperglycemia: Secondary | ICD-10-CM | POA: Diagnosis not present

## 2016-05-24 ENCOUNTER — Other Ambulatory Visit: Payer: Self-pay | Admitting: Obstetrics and Gynecology

## 2016-05-24 DIAGNOSIS — Z1231 Encounter for screening mammogram for malignant neoplasm of breast: Secondary | ICD-10-CM

## 2016-05-27 ENCOUNTER — Ambulatory Visit: Payer: Self-pay

## 2016-06-01 ENCOUNTER — Ambulatory Visit (HOSPITAL_COMMUNITY)
Admission: RE | Admit: 2016-06-01 | Discharge: 2016-06-01 | Disposition: A | Discharge status: Home / Self Care | Payer: 59 | Source: Ambulatory Visit | Attending: Endocrinology | Admitting: Endocrinology

## 2016-06-01 DIAGNOSIS — E042 Nontoxic multinodular goiter: Secondary | ICD-10-CM | POA: Diagnosis not present

## 2016-06-01 DIAGNOSIS — E049 Nontoxic goiter, unspecified: Secondary | ICD-10-CM

## 2016-06-02 ENCOUNTER — Ambulatory Visit (INDEPENDENT_AMBULATORY_CARE_PROVIDER_SITE_OTHER): Payer: 59 | Admitting: Podiatry

## 2016-06-02 ENCOUNTER — Encounter: Payer: Self-pay | Admitting: Podiatry

## 2016-06-02 DIAGNOSIS — E1142 Type 2 diabetes mellitus with diabetic polyneuropathy: Secondary | ICD-10-CM | POA: Diagnosis not present

## 2016-06-02 DIAGNOSIS — M7750 Other enthesopathy of unspecified foot: Secondary | ICD-10-CM

## 2016-06-02 DIAGNOSIS — R252 Cramp and spasm: Secondary | ICD-10-CM

## 2016-06-02 DIAGNOSIS — M722 Plantar fascial fibromatosis: Secondary | ICD-10-CM

## 2016-06-02 DIAGNOSIS — M6588 Other synovitis and tenosynovitis, other site: Secondary | ICD-10-CM

## 2016-06-04 NOTE — Progress Notes (Signed)
She presents today complaining of spasms to the medial longitudinal arch and pain near his surgical site of her posterior tibial tendon repair. She feels that her orthotic may be causing a ridge along her medial longitudinal arch as well.  Objective: Vital signs are stable alert and oriented 3 pulses are palpable. Neurologic sensorium is intact. Deep tendon reflexes are intact. She has fluctuance near the insertion site of the posterior tibial tendon just distal to the medial malleolus left foot. No other reproducible pain.  Assessment: Posterior tibial tendinitis left.  Plan: I injected the area today with Kenalog and local anesthetic. Follow-up with her in 1 month to 6 weeks. I also recommended a new tear shoes. She has had the same shoes since last year. They need to be replaced this will allow her orthotics work more correctly.

## 2016-06-13 MED FILL — TRIAMCINOLONE 0.1% CREAM: 0.1 | 10 days supply | Qty: 30 | Fill #1

## 2016-06-13 MED FILL — NYSTATIN-TRIAMCINOLONE OINT: 100000-0.1 | 10 days supply | Qty: 30 | Fill #2

## 2016-06-15 ENCOUNTER — Emergency Department (HOSPITAL_COMMUNITY): Payer: 59

## 2016-06-15 ENCOUNTER — Encounter (HOSPITAL_COMMUNITY): Payer: Self-pay

## 2016-06-15 ENCOUNTER — Emergency Department (HOSPITAL_COMMUNITY)
Admission: EM | Admit: 2016-06-15 | Discharge: 2016-06-15 | Disposition: A | Discharge status: Home / Self Care | Payer: 59 | Attending: Emergency Medicine | Admitting: Emergency Medicine

## 2016-06-15 DIAGNOSIS — Z79899 Other long term (current) drug therapy: Secondary | ICD-10-CM | POA: Insufficient documentation

## 2016-06-15 DIAGNOSIS — I493 Ventricular premature depolarization: Secondary | ICD-10-CM | POA: Diagnosis not present

## 2016-06-15 DIAGNOSIS — I10 Essential (primary) hypertension: Secondary | ICD-10-CM | POA: Insufficient documentation

## 2016-06-15 DIAGNOSIS — E1149 Type 2 diabetes mellitus with other diabetic neurological complication: Secondary | ICD-10-CM | POA: Insufficient documentation

## 2016-06-15 DIAGNOSIS — Z794 Long term (current) use of insulin: Secondary | ICD-10-CM | POA: Insufficient documentation

## 2016-06-15 DIAGNOSIS — E039 Hypothyroidism, unspecified: Secondary | ICD-10-CM | POA: Diagnosis not present

## 2016-06-15 DIAGNOSIS — R002 Palpitations: Secondary | ICD-10-CM | POA: Diagnosis present

## 2016-06-15 DIAGNOSIS — Z7984 Long term (current) use of oral hypoglycemic drugs: Secondary | ICD-10-CM | POA: Insufficient documentation

## 2016-06-15 DIAGNOSIS — R0602 Shortness of breath: Secondary | ICD-10-CM | POA: Diagnosis not present

## 2016-06-15 DIAGNOSIS — R079 Chest pain, unspecified: Secondary | ICD-10-CM | POA: Diagnosis not present

## 2016-06-15 LAB — I-STAT TROPONIN, ED
TROPONIN I, POC: 0 ng/mL (ref 0.00–0.08)
Troponin i, poc: 0.01 ng/mL (ref 0.00–0.08)

## 2016-06-15 LAB — BASIC METABOLIC PANEL
ANION GAP: 9 (ref 5–15)
BUN: 9 mg/dL (ref 6–20)
CALCIUM: 9.6 mg/dL (ref 8.9–10.3)
CO2: 28 mmol/L (ref 22–32)
Chloride: 103 mmol/L (ref 101–111)
Creatinine, Ser: 0.72 mg/dL (ref 0.44–1.00)
GFR calc Af Amer: >60 mL/min (ref >60)
GLUCOSE: 132 mg/dL — AB (ref 65–99)
Potassium: 3.7 mmol/L (ref 3.5–5.1)
Sodium: 140 mmol/L (ref 135–145)

## 2016-06-15 LAB — CBC
HCT: 46.4 % — ABNORMAL HIGH (ref 36.0–46.0)
HEMOGLOBIN: 15.5 g/dL — AB (ref 12.0–15.0)
MCH: 28.8 pg (ref 26.0–34.0)
MCHC: 33.4 g/dL (ref 30.0–36.0)
MCV: 86.1 fL (ref 78.0–100.0)
Platelets: 242 10*3/uL (ref 150–400)
RBC: 5.39 MIL/uL — ABNORMAL HIGH (ref 3.87–5.11)
RDW: 13.9 % (ref 11.5–15.5)
WBC: 6.2 10*3/uL (ref 4.0–10.5)

## 2016-06-15 NOTE — ED Provider Notes (Signed)
[facility]-EMERGENCY DEPT Provider Note   CSN: [id number] Arrival date & time: 06/15/16  4975  First Provider Contact:  First MD Initiated Contact with Patient 06/15/16 1037        History   Chief Complaint Chief Complaint  Patient presents with  . Palpitations    HPI Amy Adams is a 50 y.o. female.  HPI Pt started having palpitations this past week.  They are uncomfortable when they occur.  It occurs for seconds at a time.   She also feels it in her neck.  No shortness of breath.  No diaphoresis.  She saw her PCP and had referred her to a cardiologist.  She has been having increasing episodes that seem to be getting worse so she tried to move up the appointment.  She was not able to do that.  It got stronger this am so she came to the ED. Past Medical History:  Diagnosis Date  . Anxiety   . Arthritis   . Diabetes mellitus without complication (HCC)   . History of migraine headaches   . Hyperlipidemia   . Hypertension   . Thyroid disease     Patient Active Problem List   Diagnosis Date Noted  . HYPOTHYROIDISM 12/01/2010  . DM 12/01/2010  . 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 OTHER AND UNSPECIFIED HYPERLIPIDEMIA 12/01/2010  . OBESITY, UNSPECIFIED 12/01/2010  . ESSENTIAL HYPERTENSION, BENIGN 12/01/2010  . RHEUMATOID ARTHRITIS 12/01/2010    Past Surgical History:  Procedure Laterality Date  . ABDOMINAL HYSTERECTOMY    . CARPAL TUNNEL RELEASE    . CESAREAN SECTION    . ENDOSCOPIC PLANTAR FASCIOTOMY Left   . HAMMER TOE SURGERY Left   . METATARSAL OSTEOTOMY Left   . phenol matrixectomy Right    1st toenail  . TONSILLECTOMY      OB History    No data available       Home Medications    Prior to Admission medications   Medication Sig Start Date End Date Taking? Authorizing Provider  albuterol (PROVENTIL HFA;VENTOLIN HFA) 108 (90 BASE) MCG/ACT inhaler Inhale 1-2 puffs into the lungs every 6 (six) hours as needed for wheezing or  shortness of breath.    Historical Provider, MD  ALPRAZolam Prudy Feeler) 1 MG tablet Take 1 mg by mouth 3 (three) times daily as needed for sleep.    Historical Provider, MD  amLODipine (NORVASC) 10 MG tablet Take 10 mg by mouth daily.    Historical Provider, MD  atorvastatin (LIPITOR) 40 MG tablet Take 40 mg by mouth daily.    Historical Provider, MD  cyclobenzaprine (FLEXERIL) 10 MG tablet Take 1 tablet (10 mg total) by mouth 3 (three) times daily. 04/07/16   Max T Hyatt, DPM  DULoxetine (CYMBALTA) 60 MG capsule Take 120 mg by mouth at bedtime.    Historical Provider, MD  estradiol (ESTRACE) 0.5 MG tablet Take 0.5 mg by mouth daily.    Historical Provider, MD  fenofibrate micronized (LOFIBRA) 134 MG capsule Take 134 mg by mouth daily.    Historical Provider, MD  folic acid (FOLVITE) 1 MG tablet Take 2 mg by mouth daily.     Historical Provider, MD  gabapentin (NEURONTIN) 400 MG capsule Take 1 capsule (400 mg total) by mouth 3 (three) times daily. 04/07/16   Max T Hyatt, DPM  hydrochlorothiazide (HYDRODIURIL) 25 MG tablet Take 25 mg by mouth daily.    Historical Provider, MD  hydroxychloroquine (PLAQUENIL) 200 MG tablet Take 200 mg  by mouth 2 (two) times daily.    Historical Provider, MD  ibuprofen (ADVIL,MOTRIN) 800 MG tablet Take 1 tablet (800 mg total) by mouth every 8 (eight) hours as needed. Patient not taking: Reported on 02/05/2016 10/02/15   Trula Slade, DPM  ibuprofen (ADVIL,MOTRIN) 800 MG tablet Take 1 tablet (800 mg total) by mouth every 8 (eight) hours as needed. 10/22/15   Max T Hyatt, DPM  insulin glargine (LANTUS SOLOSTAR) 100 UNIT/ML injection Inject 55 Units into the skin 2 (two) times daily.     Historical Provider, MD  insulin lispro (HUMALOG KWIKPEN) 100 UNIT/ML injection Inject 12 Units into the skin 3 (three) times daily before meals. 12-15 units sliding scale    Historical Provider, MD  levocetirizine (XYZAL) 5 MG tablet Take 5 mg by mouth every evening.    Historical Provider,  MD  levothyroxine (SYNTHROID, LEVOTHROID) 50 MCG tablet Take 50 mcg by mouth daily.    Historical Provider, MD  losartan (COZAAR) 100 MG tablet Take 100 mg by mouth daily.    Historical Provider, MD  metFORMIN (GLUCOPHAGE-XR) 500 MG 24 hr tablet Take 1,000 mg by mouth 2 (two) times daily.     Historical Provider, MD  methotrexate 25 MG/ML SOLN 20 mg once a week. Inject 0.8 mL once weekly at bedtime. Every Thursday    Historical Provider, MD  montelukast (SINGULAIR) 10 MG tablet Take 10 mg by mouth at bedtime.    Historical Provider, MD  omeprazole (PRILOSEC) 40 MG capsule Take 40 mg by mouth 2 (two) times daily.    Historical Provider, MD  traMADol (ULTRAM) 50 MG tablet Take 1 tablet (50 mg total) by mouth every 8 (eight) hours as needed. Patient taking differently: Take 50 mg by mouth 2 (two) times daily.  11/19/13   Max T Hyatt, DPM  zolpidem (AMBIEN CR) 12.5 MG CR tablet Take 12.5 mg by mouth at bedtime as needed for sleep.    Historical Provider, MD    Family History Family History  Problem Relation Age of Onset  . Diabetes Mother   . Migraines Mother   . Diabetes Sister     Social History Social History  Substance Use Topics  . Smoking status: Never Smoker  . Smokeless tobacco: Never Used  . Alcohol use No     Allergies   Dilaudid [hydromorphone hcl]; Doxycycline; and Trazodone and nefazodone   Review of Systems Review of Systems  All other systems reviewed and are negative.    Physical Exam Updated Vital Signs BP 155/87   Pulse 84   Temp 98.2 F (36.8 C) (Oral)   Resp 15   SpO2 98%   Physical Exam  Constitutional: No distress.  overweight  HENT:  Head: Normocephalic and atraumatic.  Right Ear: External ear normal.  Left Ear: External ear normal.  Eyes: Conjunctivae are normal. Right eye exhibits no discharge. Left eye exhibits no discharge. No scleral icterus.  Neck: Neck supple. No tracheal deviation present.  Cardiovascular: Normal rate, regular rhythm  and intact distal pulses.   Pulmonary/Chest: Effort normal and breath sounds normal. No stridor. No respiratory distress. She has no wheezes. She has no rales.  Abdominal: Soft. Bowel sounds are normal. She exhibits no distension. There is no tenderness. There is no rebound and no guarding.  Musculoskeletal: She exhibits no edema or tenderness.  Neurological: She is alert. She has normal strength. No cranial nerve deficit (no facial droop, extraocular movements intact, no slurred speech) or sensory deficit. She exhibits  normal muscle tone. She displays no seizure activity. Coordination normal.  Skin: Skin is warm and dry. No rash noted.  Psychiatric: She has a normal mood and affect.  Nursing note and vitals reviewed.    ED Treatments / Results  Labs (all labs ordered are listed, but only abnormal results are displayed) Labs Reviewed  BASIC METABOLIC PANEL - Abnormal; Notable for the following:       Result Value   Glucose, Bld 132 (*)    All other components within normal limits  CBC - Abnormal; Notable for the following:    RBC 5.39 (*)    Hemoglobin 15.5 (*)    HCT 46.4 (*)    All other components within normal limits  I-STAT TROPOININ, ED  I-STAT TROPOININ, ED    EKG  EKG Interpretation  Date/Time:  Wednesday June 15 2016 09:47:04 EDT Ventricular Rate:  97 PR Interval:  150 QRS Duration: 94 QT Interval:  372 QTC Calculation: 472 R Axis:   169 Text Interpretation:  suspect limb lead reversal compared to prior ECG Normal sinus rhythm Confirmed by Bascom Biel  MD-J, Maureena Dabbs ([id number]) on 06/15/2016 10:37:09 AM       Radiology Dg Chest 2 View  Result Date: 06/15/2016 CLINICAL DATA:  Central and left-sided chest tightness, shortness of breath, palpitations for 1 week. EXAM: CHEST  2 VIEW COMPARISON:  02/02/2012 FINDINGS: Heart and mediastinal contours are within normal limits. No focal opacities or effusions. No acute bony abnormality. IMPRESSION: No active cardiopulmonary disease.  Electronically Signed   By: Rolm Baptise M.D.   On: 06/15/2016 10:51    Procedures Procedures (including critical care time)   Initial Impression / Assessment and Plan / ED Course  I have reviewed the triage vital signs and the nursing notes.  Pertinent labs & imaging results that were available during my care of the patient were reviewed by me and considered in my medical decision making (see chart for details).  Clinical Course  Comment By Time  PVC noted on the monitor  Dorie Rank, MD 08/02 1045  2nd trop normal Dorie Rank, MD 08/02 1410    Suspect her sx are related to the PVC.  Her symptoms correlate exactly with when a PVC was noted on the monitor.   Doubt ACS.  Pt has plans to follow up with cardiology.  Will have her dc caffeine.   Final Clinical Impressions(s) / ED Diagnoses   Final diagnoses:  PVC's (premature ventricular contractions)      Dorie Rank, MD 06/15/16 1453

## 2016-06-15 NOTE — ED Notes (Signed)
Provided patient with graham crackers and peanut butter prior to leaving. Patient verbalized understanding of discharge instructions and denies any further needs or questions at this time. VS stable. Patient ambulatory with steady gait, declined wheelchair, so RN walked with patient to ED entrance.

## 2016-06-15 NOTE — ED Notes (Signed)
Gave patient some water per Dr Tomi Bamberger

## 2016-06-15 NOTE — ED Triage Notes (Signed)
Pt reports intermittent palpitations X3-4 weeks, has appt at cardiologist next week but palpitations have gotten worse over the last couple days. She reports intermittent dizziness and chest pain as well.

## 2016-06-20 ENCOUNTER — Ambulatory Visit
Admission: RE | Admit: 2016-06-20 | Discharge: 2016-06-20 | Disposition: A | Discharge status: Home / Self Care | Payer: 59 | Source: Ambulatory Visit | Attending: Obstetrics and Gynecology | Admitting: Obstetrics and Gynecology

## 2016-06-20 DIAGNOSIS — Z1231 Encounter for screening mammogram for malignant neoplasm of breast: Secondary | ICD-10-CM

## 2016-06-21 ENCOUNTER — Ambulatory Visit (INDEPENDENT_AMBULATORY_CARE_PROVIDER_SITE_OTHER): Payer: 59 | Admitting: Cardiovascular Disease

## 2016-06-21 ENCOUNTER — Encounter: Payer: Self-pay | Admitting: Cardiovascular Disease

## 2016-06-21 VITALS — BP 144/90 | HR 92 | Ht 69.0 in | Wt 269.4 lb

## 2016-06-21 DIAGNOSIS — R0600 Dyspnea, unspecified: Secondary | ICD-10-CM | POA: Diagnosis not present

## 2016-06-21 DIAGNOSIS — R0789 Other chest pain: Secondary | ICD-10-CM

## 2016-06-21 NOTE — Patient Instructions (Addendum)
Medication Instructions:  Your physician recommends that you continue on your current medications as directed. Please refer to the Current Medication list given to you today.  Labwork: NONE  Testing/Procedures: Your physician has requested that you have an echocardiogram. Echocardiography is a painless test that uses sound waves to create images of your heart. It provides your doctor with information about the size and shape of your heart and how well your heart's chambers and valves are working. This procedure takes approximately one hour. There are no restrictions for this procedure.  Your physician has requested that you have a lexiscan myoview. For further information please visit [email or web address]. Please follow instruction sheet, as given.   Follow-Up: Your physician wants you to follow-up as needed with Dr. Nishan.   If you need a refill on your cardiac medications before your next appointment, please call your pharmacy.    

## 2016-06-21 NOTE — Progress Notes (Signed)
Cardiology Office Note   Date:  06/21/2016   ID:  Amy, Adams [birth year], MRN [id number]  PCP:  Shirline Frees, MD  Cardiologist:   Jenkins Rouge, MD   No chief complaint on file.     History of Present Illness: Amy Adams is a 50 y.o. female who presents for evaluation of chest pain and palpitations.  Sees Dr Soyla Murphy for DM and thyroid issues. Also on statin for cholesterol And ARB  for HTN.  Seen in ER 06/15/16 with palpitations and noted PVCls that correlated with symptoms  She has not been active in 3 years Had 5 left foot surgeries culminating in 4 th toe amputation Gained weight. Tightness in chest when active and SOB. No rest pains. Sweats a lot Compliant with meds Denies ETOH excess or smoking  Husband works Architect and is in [place] till December Both kids going through divorce and daughter / grand kids Living with her now    Cr .25 LDL 59 TC 180 HDL 51 TSH 1.59   Past Medical History:  Diagnosis Date  . Anxiety   . Arthritis   . Diabetes mellitus without complication ([place])   . History of migraine headaches   . Hyperlipidemia   . Hypertension   . Thyroid disease     Past Surgical History:  Procedure Laterality Date  . ABDOMINAL HYSTERECTOMY    . CARPAL TUNNEL RELEASE    . CESAREAN SECTION    . ENDOSCOPIC PLANTAR FASCIOTOMY Left   . HAMMER TOE SURGERY Left   . METATARSAL OSTEOTOMY Left   . phenol matrixectomy Right    1st toenail  . TONSILLECTOMY       Current Outpatient Prescriptions  Medication Sig Dispense Refill  . albuterol (PROVENTIL HFA;VENTOLIN HFA) 108 (90 BASE) MCG/ACT inhaler Inhale 1-2 puffs into the lungs every 6 (six) hours as needed for wheezing or shortness of breath.    . ALPRAZolam (XANAX) 1 MG tablet Take 1 mg by mouth 3 (three) times daily as needed for sleep.    Marland Kitchen amLODipine (NORVASC) 10 MG tablet Take 10 mg by mouth daily.    Marland Kitchen atorvastatin (LIPITOR) 40 MG tablet Take 40 mg by mouth daily.    . cyclobenzaprine  (FLEXERIL) 10 MG tablet Take 1 tablet (10 mg total) by mouth 3 (three) times daily. 90 tablet 1  . DULoxetine (CYMBALTA) 60 MG capsule Take 120 mg by mouth at bedtime.    Marland Kitchen estradiol (ESTRACE) 0.5 MG tablet Take 0.5 mg by mouth daily.    . fenofibrate micronized (LOFIBRA) 134 MG capsule Take 134 mg by mouth daily.    . folic acid (FOLVITE) 1 MG tablet Take 2 mg by mouth daily.     Marland Kitchen gabapentin (NEURONTIN) 400 MG capsule Take 1 capsule (400 mg total) by mouth 3 (three) times daily. 270 capsule 3  . hydrochlorothiazide (HYDRODIURIL) 25 MG tablet Take 25 mg by mouth daily.    . hydroxychloroquine (PLAQUENIL) 200 MG tablet Take 200 mg by mouth 2 (two) times daily.    Marland Kitchen ibuprofen (ADVIL,MOTRIN) 800 MG tablet Take 1 tablet (800 mg total) by mouth every 8 (eight) hours as needed. 30 tablet 0  . insulin glargine (LANTUS SOLOSTAR) 100 UNIT/ML injection Inject 55 Units into the skin 2 (two) times daily.     . insulin lispro (HUMALOG KWIKPEN) 100 UNIT/ML injection Inject 12 Units into the skin 3 (three) times daily before meals. 12-15 units sliding scale    . levocetirizine (  XYZAL) 5 MG tablet Take 5 mg by mouth every evening.    Marland Kitchen levothyroxine (SYNTHROID, LEVOTHROID) 50 MCG tablet Take 50 mcg by mouth daily.    Marland Kitchen losartan (COZAAR) 100 MG tablet Take 100 mg by mouth daily.    . metFORMIN (GLUCOPHAGE-XR) 500 MG 24 hr tablet Take 1,000 mg by mouth 2 (two) times daily.     . methotrexate 25 MG/ML SOLN 20 mg once a week. Inject 0.8 mL once weekly at bedtime. Every Thursday    . montelukast (SINGULAIR) 10 MG tablet Take 10 mg by mouth at bedtime.    Marland Kitchen omeprazole (PRILOSEC) 40 MG capsule Take 40 mg by mouth 2 (two) times daily.    . traMADol (ULTRAM) 50 MG tablet Take 1 tablet (50 mg total) by mouth every 8 (eight) hours as needed. (Patient taking differently: Take 50 mg by mouth 2 (two) times daily. ) 30 tablet 0  . zolpidem (AMBIEN CR) 12.5 MG CR tablet Take 12.5 mg by mouth at bedtime as needed for sleep.      No current facility-administered medications for this visit.     Allergies:   Dilaudid [hydromorphone hcl]; Doxycycline; and Trazodone and nefazodone    Social History:  The patient  reports that she has never smoked. She has never used smokeless tobacco. She reports that she does not drink alcohol or use drugs.   Family History:  The patient's family history includes Diabetes in her mother and sister; Migraines in her mother.    ROS:  Please see the history of present illness.   Otherwise, review of systems are positive for none.   All other systems are reviewed and negative.    PHYSICAL EXAM: VS:  BP (!) 144/90   Pulse 92   Ht 5\' 9"  (1.753 m)   Wt 269 lb 6.4 oz (122.2 kg)   BMI 39.78 kg/m  , BMI Body mass index is 39.78 kg/m. Affect appropriate Obese white female  HEENT: nodular thyroid  Neck supple with no adenopathy JVP normal no bruits no thyromegaly Lungs clear with no wheezing and good diaphragmatic motion Heart:  S1/S2 no murmur, no rub, gallop or click PMI normal Abdomen: benighn, BS positve, no tenderness, no AAA no bruit.  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    ASSESSMENT AND PLAN:  1.  Chest Pain: atypical ECG ok unable to walk on treadmill f/u myovue likely need to be 2 day 2. PVC;s need to r/o structural heart dx  Echo and myovue QT and baseline ECG ok except for limb lead reversal 3. DM:  Discussed low carb diet.  Target hemoglobin A1c is 6.5 or less.  Continue current medications. 4. Thyroid: nodule biopsy last year ok f/u Balin 5. Dyspnea: likely from deconditioning  and obesity echo for RV/LV function  6. RA:  Continue plaquenil and methotrexate f/u [facility]    Current medicines are reviewed at length with the patient today.  The patient does not have concerns regarding medicines.  The following changes have been made:  no change  Labs/ tests ordered today include: Myovue echo   Orders Placed This Encounter  Procedures  . Myocardial Perfusion Imaging  . ECHOCARDIOGRAM COMPLETE     Disposition:   FU with me PRN      Signed, Jenkins Rouge, MD  06/21/2016 3:55 PM    [address], [place], [place]  [address] Phone: [phone]; Fax: [phone]

## 2016-06-28 DIAGNOSIS — M0589 Other rheumatoid arthritis with rheumatoid factor of multiple sites: Secondary | ICD-10-CM | POA: Diagnosis not present

## 2016-06-28 DIAGNOSIS — Z79899 Other long term (current) drug therapy: Secondary | ICD-10-CM | POA: Diagnosis not present

## 2016-06-28 DIAGNOSIS — M79631 Pain in right forearm: Secondary | ICD-10-CM | POA: Diagnosis not present

## 2016-06-28 DIAGNOSIS — M5136 Other intervertebral disc degeneration, lumbar region: Secondary | ICD-10-CM | POA: Diagnosis not present

## 2016-06-28 DIAGNOSIS — M150 Primary generalized (osteo)arthritis: Secondary | ICD-10-CM | POA: Diagnosis not present

## 2016-06-30 ENCOUNTER — Encounter: Payer: Self-pay | Admitting: Podiatry

## 2016-06-30 ENCOUNTER — Ambulatory Visit (INDEPENDENT_AMBULATORY_CARE_PROVIDER_SITE_OTHER): Payer: 59 | Admitting: Podiatry

## 2016-06-30 ENCOUNTER — Ambulatory Visit (INDEPENDENT_AMBULATORY_CARE_PROVIDER_SITE_OTHER): Payer: 59

## 2016-06-30 VITALS — BP 146/100 | HR 82 | Resp 12

## 2016-06-30 DIAGNOSIS — M7750 Other enthesopathy of unspecified foot: Secondary | ICD-10-CM

## 2016-06-30 DIAGNOSIS — M6588 Other synovitis and tenosynovitis, other site: Secondary | ICD-10-CM

## 2016-06-30 NOTE — Progress Notes (Signed)
She resists today for follow-up of tender spot to her left anterolateral ankle. She stated felt something pop yesterday and it was swollen and terribly painful. She states today he feels much better. Patient forced the anterolateral aspect of the ankle where we repaired her ATFL.  Objective: Vital signs are stable alert and oriented 3 pulse remained palpable. She has some tenderness along incision site of the lateral ankle stabilization. Radiographs demonstrate no fractures and the anchor is still in place to the fibula.  Assessment: Nearly thing that I can figure at this point is that she possibly tore a portion of the suture from the anchor itself.  Plan: Instructed her to follow up with me immediately should this recur. Otherwise I will follow up with her at her regular scheduled appointment.

## 2016-07-04 ENCOUNTER — Telehealth (HOSPITAL_COMMUNITY): Payer: Self-pay | Admitting: *Deleted

## 2016-07-04 NOTE — Telephone Encounter (Signed)
Left message on voicemail per DPR in reference to upcoming appointment scheduled on 07/07/16 at 1230 with detailed instructions given per Myocardial Perfusion Study Information Sheet for the test. LM to arrive 15 minutes early, and that it is imperative to arrive on time for appointment to keep from having the test rescheduled. If you need to cancel or reschedule your appointment, please call the office within 24 hours of your appointment. Failure to do so may result in a cancellation of your appointment, and a $50 no show fee. Phone number given for call back for any questions.

## 2016-07-07 ENCOUNTER — Other Ambulatory Visit: Payer: Self-pay

## 2016-07-07 ENCOUNTER — Ambulatory Visit (HOSPITAL_BASED_OUTPATIENT_CLINIC_OR_DEPARTMENT_OTHER): Payer: 59

## 2016-07-07 ENCOUNTER — Ambulatory Visit (HOSPITAL_COMMUNITY): Payer: 59 | Attending: Cardiology

## 2016-07-07 DIAGNOSIS — E119 Type 2 diabetes mellitus without complications: Secondary | ICD-10-CM | POA: Insufficient documentation

## 2016-07-07 DIAGNOSIS — R0789 Other chest pain: Secondary | ICD-10-CM

## 2016-07-07 DIAGNOSIS — I119 Hypertensive heart disease without heart failure: Secondary | ICD-10-CM | POA: Insufficient documentation

## 2016-07-07 DIAGNOSIS — E785 Hyperlipidemia, unspecified: Secondary | ICD-10-CM | POA: Diagnosis not present

## 2016-07-07 DIAGNOSIS — E079 Disorder of thyroid, unspecified: Secondary | ICD-10-CM | POA: Insufficient documentation

## 2016-07-07 DIAGNOSIS — I340 Nonrheumatic mitral (valve) insufficiency: Secondary | ICD-10-CM | POA: Diagnosis not present

## 2016-07-07 DIAGNOSIS — Z6839 Body mass index (BMI) 39.0-39.9, adult: Secondary | ICD-10-CM | POA: Insufficient documentation

## 2016-07-07 DIAGNOSIS — R0602 Shortness of breath: Secondary | ICD-10-CM | POA: Diagnosis not present

## 2016-07-07 DIAGNOSIS — R0600 Dyspnea, unspecified: Secondary | ICD-10-CM

## 2016-07-07 DIAGNOSIS — R079 Chest pain, unspecified: Secondary | ICD-10-CM | POA: Diagnosis present

## 2016-07-07 MED ORDER — TECHNETIUM TC 99M TETROFOSMIN IV KIT
32.5000 | PACK | Freq: Once | INTRAVENOUS | Status: AC | PRN
  Administered 2016-07-07: 33 via INTRAVENOUS
  Filled 2016-07-07: qty 33

## 2016-07-07 MED ORDER — REGADENOSON 0.4 MG/5ML IV SOLN
0.4000 mg | Freq: Once | INTRAVENOUS | Status: AC
  Administered 2016-07-07: 0.4 mg via INTRAVENOUS

## 2016-07-07 MED ORDER — PERFLUTREN LIPID MICROSPHERE
1.0000 mL | INTRAVENOUS | Status: AC | PRN
  Administered 2016-07-07: 3 mL via INTRAVENOUS

## 2016-07-08 ENCOUNTER — Ambulatory Visit: Payer: Self-pay

## 2016-07-08 ENCOUNTER — Ambulatory Visit (HOSPITAL_COMMUNITY): Payer: 59 | Attending: Cardiology

## 2016-07-08 MED ORDER — TECHNETIUM TC 99M TETROFOSMIN IV KIT
32.7000 | PACK | Freq: Once | INTRAVENOUS | Status: AC | PRN
  Administered 2016-07-08: 32.7 via INTRAVENOUS
  Filled 2016-07-08: qty 33

## 2016-07-10 LAB — MYOCARDIAL PERFUSION IMAGING
CHL CUP NUCLEAR SDS: 0
CHL CUP NUCLEAR SRS: 2
LV dias vol: 121 mL (ref 46–106)
LV sys vol: 60 mL
RATE: 0.28
SSS: 2
TID: 1

## 2016-07-11 DIAGNOSIS — I10 Essential (primary) hypertension: Secondary | ICD-10-CM | POA: Diagnosis not present

## 2016-07-11 DIAGNOSIS — E782 Mixed hyperlipidemia: Secondary | ICD-10-CM | POA: Diagnosis not present

## 2016-07-11 DIAGNOSIS — M069 Rheumatoid arthritis, unspecified: Secondary | ICD-10-CM | POA: Diagnosis not present

## 2016-07-11 DIAGNOSIS — E1142 Type 2 diabetes mellitus with diabetic polyneuropathy: Secondary | ICD-10-CM | POA: Diagnosis not present

## 2016-07-11 DIAGNOSIS — F329 Major depressive disorder, single episode, unspecified: Secondary | ICD-10-CM | POA: Diagnosis not present

## 2016-07-11 DIAGNOSIS — G4700 Insomnia, unspecified: Secondary | ICD-10-CM | POA: Diagnosis not present

## 2016-07-11 DIAGNOSIS — E039 Hypothyroidism, unspecified: Secondary | ICD-10-CM | POA: Diagnosis not present

## 2016-07-11 DIAGNOSIS — K219 Gastro-esophageal reflux disease without esophagitis: Secondary | ICD-10-CM | POA: Diagnosis not present

## 2016-07-11 DIAGNOSIS — F419 Anxiety disorder, unspecified: Secondary | ICD-10-CM | POA: Diagnosis not present

## 2016-07-12 MED FILL — TRIAMCINOLONE 0.1% CREAM: 0.1 | 10 days supply | Qty: 30 | Fill #0

## 2016-07-12 MED FILL — NYSTATIN 100,000 UNIT/GM CR: 100000 | 10 days supply | Qty: 30 | Fill #0

## 2016-07-13 ENCOUNTER — Telehealth: Payer: Self-pay | Admitting: Podiatry

## 2016-07-13 NOTE — Telephone Encounter (Signed)
Pt called and cxled her appt. She wanted me to let you know she is feeling better, thank you

## 2016-07-14 ENCOUNTER — Ambulatory Visit: Payer: 59 | Admitting: Podiatry

## 2016-07-25 MED FILL — DULoxetine HCL 60 MG CPEP: 60 | 90 days supply | Qty: 180 | Fill #0

## 2016-07-25 MED FILL — VENTOLIN HFA 90 MCG INHALER: 108 (90 BAS | 90 days supply | Qty: 54 | Fill #0

## 2016-07-25 MED FILL — HUMALOG 100 UNITS/ML KWIKPE: 100 | 90 days supply | Qty: 90 | Fill #0

## 2016-07-25 MED FILL — METHOTREXATE 25 MG/ML VIAL: 50 | 84 days supply | Qty: 10 | Fill #0

## 2016-07-25 MED FILL — GABAPENTIN 400 MG CAPSULE: 400 | 90 days supply | Qty: 270 | Fill #0

## 2016-07-25 MED FILL — LEVOCETIRIZINE 5 MG TABLET: 5 | 90 days supply | Qty: 90 | Fill #0

## 2016-07-25 MED FILL — ZOLPIDEM TART ER 12.5 MG TA: 12.5 | 90 days supply | Qty: 90 | Fill #0

## 2016-07-25 MED FILL — OMEPRAZOLE DR 40 MG CAPSULE: 40 | 90 days supply | Qty: 180 | Fill #0

## 2016-07-25 MED FILL — CYCLOBENZAPRINE 10 MG TAB: 10 | 30 days supply | Qty: 90 | Fill #0

## 2016-07-25 MED FILL — MONTELUKAST SOD 10 MG TAB: 10 | 90 days supply | Qty: 90 | Fill #0

## 2016-07-25 MED FILL — LANTUS SOLOSTAR 100 UNITS/M: 100 | 90 days supply | Qty: 135 | Fill #0

## 2016-07-25 MED FILL — FOLIC ACID 1 MG TABLET: 1 | 90 days supply | Qty: 180 | Fill #0

## 2016-07-25 MED FILL — LOSARTAN POTASSIUM 100 MG T: 100 | 90 days supply | Qty: 90 | Fill #0

## 2016-07-25 MED FILL — LEVOTHYROXINE 50 MCG TABLET: 50 | 90 days supply | Qty: 90 | Fill #0

## 2016-07-25 MED FILL — HYDROXYCHLOROQUINE 200 MG T: 200 | 90 days supply | Qty: 180 | Fill #0

## 2016-07-25 MED FILL — HYDROCHLOROTHIAZIDE 25 MG T: 25 | 90 days supply | Qty: 90 | Fill #0

## 2016-07-25 MED FILL — FENOFIBRATE 134 MG CAPSULE: 134 | 90 days supply | Qty: 90 | Fill #0

## 2016-07-25 MED FILL — ALPRAZolam 1 MG TABS: 1 | 90 days supply | Qty: 270 | Fill #0

## 2016-07-25 MED FILL — METFORMIN HCL ER 500 MG TAB: 500 | 90 days supply | Qty: 360 | Fill #0

## 2016-07-27 MED FILL — traMADol HCL 50 MG TABS: 50 | 90 days supply | Qty: 270 | Fill #0

## 2016-08-02 DIAGNOSIS — Z01419 Encounter for gynecological examination (general) (routine) without abnormal findings: Secondary | ICD-10-CM | POA: Diagnosis not present

## 2016-08-02 DIAGNOSIS — Z6839 Body mass index (BMI) 39.0-39.9, adult: Secondary | ICD-10-CM | POA: Diagnosis not present

## 2016-08-05 MED FILL — ESTRADIOL 0.5 MG TABLET: 0.5 | 30 days supply | Qty: 15 | Fill #0

## 2016-08-05 MED FILL — FLUCONAZOLE 150 MG TABLET: 150 | 4 days supply | Qty: 2 | Fill #0

## 2016-08-12 ENCOUNTER — Other Ambulatory Visit: Payer: Self-pay

## 2016-08-12 VITALS — BP 138/90 | HR 86 | Resp 14 | Ht 69.0 in | Wt 269.0 lb

## 2016-08-12 DIAGNOSIS — E118 Type 2 diabetes mellitus with unspecified complications: Secondary | ICD-10-CM

## 2016-08-12 DIAGNOSIS — Z794 Long term (current) use of insulin: Secondary | ICD-10-CM

## 2016-08-12 NOTE — Patient Outreach (Signed)
[facility]) Care Management   08/12/2016  Amy Adams 10-May-1966 RN:[id number]  Amy Adams is an 50 y.o. female.   Member seen for follow up office visit for Link to Wellness program for self management of Type 2 diabetes  Subjective: Member states that her hemoglobin A1C was 6.6% last month when she saw her doctor.  States she has not been checking her blood sugars regularly but they had been in good range when she has checked.  States that she is now going to the gym 3 times a week and riding the exercise bike 30 minutes and then working on the weight machines.  States she is to see Dr.Balan in January.  States that the PVC she was  Objective:   Review of Systems  Musculoskeletal: Positive for joint pain.    Physical Exam Today's Vitals   08/12/16 1312 08/12/16 1325  BP: 138/90   Pulse: 86   Resp: 14   SpO2: 98%   Weight: 269 lb (122 kg)   Height: 1.753 m (5\' 9" )   PainSc: 0-No pain 0-No pain   Encounter Medications:   Outpatient Encounter Prescriptions as of 08/12/2016  Medication Sig  . albuterol (PROVENTIL HFA;VENTOLIN HFA) 108 (90 BASE) MCG/ACT inhaler Inhale 1-2 puffs into the lungs every 6 (six) hours as needed for wheezing or shortness of breath.  . ALPRAZolam (XANAX) 1 MG tablet Take 1 mg by mouth 3 (three) times daily as needed for sleep.  Marland Kitchen amLODipine (NORVASC) 10 MG tablet Take 10 mg by mouth daily.  Marland Kitchen atorvastatin (LIPITOR) 40 MG tablet Take 40 mg by mouth daily.  . cyclobenzaprine (FLEXERIL) 10 MG tablet Take 1 tablet (10 mg total) by mouth 3 (three) times daily.  . Dietary Management Product (RHEUMATE) CAPS Take 1 capsule by mouth daily.  . DULoxetine (CYMBALTA) 60 MG capsule Take 120 mg by mouth at bedtime.  Marland Kitchen estradiol (ESTRACE) 0.5 MG tablet Take 0.5 mg by mouth daily.  . fenofibrate micronized (LOFIBRA) 134 MG capsule Take 134 mg by mouth daily.  . folic acid (FOLVITE) 1 MG tablet Take 2 mg by mouth daily.   Marland Kitchen gabapentin (NEURONTIN)  400 MG capsule Take 1 capsule (400 mg total) by mouth 3 (three) times daily.  . hydrochlorothiazide (HYDRODIURIL) 25 MG tablet Take 25 mg by mouth daily.  . hydroxychloroquine (PLAQUENIL) 200 MG tablet Take 200 mg by mouth 2 (two) times daily.  . insulin glargine (LANTUS SOLOSTAR) 100 UNIT/ML injection Inject 50 Units into the skin 2 (two) times daily.   . insulin lispro (HUMALOG KWIKPEN) 100 UNIT/ML injection Inject 12 Units into the skin 3 (three) times daily before meals. 12-15 units sliding scale  . levocetirizine (XYZAL) 5 MG tablet Take 5 mg by mouth every evening.  Marland Kitchen levothyroxine (SYNTHROID, LEVOTHROID) 50 MCG tablet Take 50 mcg by mouth daily.  Marland Kitchen losartan (COZAAR) 100 MG tablet Take 100 mg by mouth daily.  . metFORMIN (GLUCOPHAGE-XR) 500 MG 24 hr tablet Take 1,000 mg by mouth 2 (two) times daily.   . methotrexate 25 MG/ML SOLN 20 mg once a week. Inject 0.8 mL once weekly at bedtime. Every Thursday  . montelukast (SINGULAIR) 10 MG tablet Take 10 mg by mouth at bedtime.  Marland Kitchen omeprazole (PRILOSEC) 40 MG capsule Take 40 mg by mouth 2 (two) times daily.  . traMADol (ULTRAM) 50 MG tablet Take 1 tablet (50 mg total) by mouth every 8 (eight) hours as needed. (Patient taking differently: Take 50 mg by  mouth 2 (two) times daily. )  . zolpidem (AMBIEN CR) 12.5 MG CR tablet Take 12.5 mg by mouth at bedtime as needed for sleep.  Marland Kitchen ibuprofen (ADVIL,MOTRIN) 800 MG tablet Take 1 tablet (800 mg total) by mouth every 8 (eight) hours as needed. (Patient not taking: Reported on 08/12/2016)   No facility-administered encounter medications on file as of 08/12/2016.     Functional Status:   In your present state of health, do you have any difficulty performing the following activities: 08/12/2016 02/05/2016  Hearing? N N  Vision? N N  Difficulty concentrating or making decisions? N N  Walking or climbing stairs? N N  Dressing or bathing? N N  Doing errands, shopping? N N  Preparing Food and eating ? - -   Some recent data might be hidden    Fall/Depression Screening:    PHQ 2/9 Scores 08/12/2016 02/05/2016 12/24/2015 09/21/2015 09/21/2015 06/09/2015 04/01/2015  PHQ - 2 Score 0 0 0 0 0 - 0  Exception Documentation - - - - - Patient refusal -    Assessment:  Member seen for follow up office visit for Link to Wellness program for self management of Type 2 diabetes.  Member is  meeting diabetes self management goal of 7% or below with last reading of 6.6%. Member is now going to gym 3 times a week.  Reports trying to follow low CHO diet and taking insulin as directed. Member not checking blood sugars regularly  Member up to date with annual eye exam and dental check ups.  Plan:  Plan to eat 30-45 GM (2-3) servings of carbohydrate a meal and 15 GM for snacks.  Plan to eat protein with your snacks Plan to check blood sugar once a day fasting or 1 -2hrs after a meal.  Goals of 80-130 fasting and 180 or less after eating. Plan to gym 3 times a week 30-60 minutes  Plan to complete EMMI programs by 11/14/16 Plan to return to Link to Wellness on 11/18/16 at [place] [facility]   Flowsheet Row Most Recent Value  Care Plan Problem One  Potential for elevated blood sugars related to dx of Type  2 DM  Role Documenting the Problem One  Care Management Coordinator  Care Plan for Problem One  Active  THN Long Term Goal (31-90 days)  Member will decrease hemoglobin A1C to 7 or below in the next 90 days  THN Long Term Goal Start Date  08/12/16 [Hemoglobin A1C decreased to 6.6%]  Interventions for Problem One Long Term Goal  Reviewed CHO counting and portion control, Reveiwed  making wise choices when eating fast foods, Praised for going to the gym and instructed on the benefits of exercise for glycemic control,  Reinforced to check her blood sugars more frequently as she is taking meal time insulin,  Instructed to keep appointment with Dr.Balan, Reviewed blood sugar goals    Peter Garter RN,  [facility] Care Management Coordinator-Link to [place] Management [phone]

## 2016-08-12 NOTE — Patient Instructions (Signed)
1. Plan to eat 30-45 GM (2-3) servings of carbohydrate a meal and 15 GM for snacks.  Plan to eat protein with your snacks 2. Plan to check blood sugar once a day fasting or 1 -2hrs after a meal.  Goals of 80-130 fasting and 180 or less after eating. 3. Plan to gym 3 times a week 30-60 minutes  4. Plan to complete EMMI programs by 11/14/16 5. Plan to return to Link to Wellness on 11/18/16 at 1PM

## 2016-08-16 MED FILL — UNIFINE PENTIPS 8MM 31G: 31G X 8 MM | 90 days supply | Qty: 400 | Fill #0

## 2016-08-22 MED FILL — FLUCONAZOLE 150 MG TABLET: 150 | 3 days supply | Qty: 2 | Fill #0

## 2016-09-15 ENCOUNTER — Telehealth: Payer: Self-pay | Admitting: *Deleted

## 2016-09-15 NOTE — Telephone Encounter (Addendum)
Pt states she woke this morning with severe burning and numbness to the left foot. I spoke with pt states sensation began today and has traveled up to her ankle, there is swelling that has been going on a couple of weeks in the arch, has an appt 09/22/2016 at 8:45am. I offered to transfer to scheduler to see another doctor or change to earlier appt pt refused. Left message for pt to call for instructions from Dr. Milinda Pointer. Informed pt of Dr. Stephenie Acres orders. Pt states her schedule is such that she may or may not have a hour sometime tomorrow to be seen. I informed Dr. Milinda Pointer and he saidd if pt would stay in the cam walker and ice and elevate she would be fine until the 09/22/2016 appt. Pt states understanding and will do as he ordered.

## 2016-09-15 NOTE — Telephone Encounter (Signed)
Put her back in cam walker with ice and elevation and offer her an appointment.

## 2016-09-19 NOTE — Addendum Note (Signed)
Addended by: Dimitri Ped on: 09/19/2016 03:07 PM   Modules accepted: Orders

## 2016-09-20 ENCOUNTER — Ambulatory Visit: Payer: 59 | Admitting: Podiatry

## 2016-09-21 ENCOUNTER — Encounter: Payer: 59 | Attending: Endocrinology | Admitting: *Deleted

## 2016-09-21 DIAGNOSIS — Z713 Dietary counseling and surveillance: Secondary | ICD-10-CM | POA: Diagnosis not present

## 2016-09-21 DIAGNOSIS — E119 Type 2 diabetes mellitus without complications: Secondary | ICD-10-CM

## 2016-09-22 ENCOUNTER — Encounter: Payer: Self-pay | Admitting: Podiatry

## 2016-09-22 ENCOUNTER — Ambulatory Visit (INDEPENDENT_AMBULATORY_CARE_PROVIDER_SITE_OTHER): Payer: 59 | Admitting: Podiatry

## 2016-09-22 ENCOUNTER — Telehealth: Payer: Self-pay | Admitting: *Deleted

## 2016-09-22 ENCOUNTER — Ambulatory Visit (INDEPENDENT_AMBULATORY_CARE_PROVIDER_SITE_OTHER): Payer: 59

## 2016-09-22 DIAGNOSIS — M7750 Other enthesopathy of unspecified foot: Secondary | ICD-10-CM

## 2016-09-22 DIAGNOSIS — M7672 Peroneal tendinitis, left leg: Secondary | ICD-10-CM

## 2016-09-22 DIAGNOSIS — M6788 Other specified disorders of synovium and tendon, other site: Secondary | ICD-10-CM

## 2016-09-22 NOTE — Patient Instructions (Signed)
Plan:  You are concerned about not being able to get to the gym the last few weeks due to extra work hours and driving home at night. 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 You will resume going to the gym after work as soon as this is possible.   Continue to plan to have a carb choice with protein and no insulin prior to any exercise  Check BG before and after exercise for the first couple of weeks  Drink water before and after exercise  Continue taking 3 units for every Carb Choice (= 1 unit for every 5 grams). You stated today that is working well for you!  Consider using fast acting carbohydrate first when treating a low BG  Such as 4 pieces sugar candy, 4 oz regular soda or fruit juice or 4 glucose tablets, then follow with starch and protein

## 2016-09-22 NOTE — Progress Notes (Signed)
Diabetes Self-Management Education  Visit Type:  Follow-up  Appt. Start Time: 0830 Appt. End Time: 0900  09/22/2016  Amy Adams, identified by name and date of birth, is a 50 y.o. female with a diagnosis of Diabetes:  .   ASSESSMENT  Height 5\' 9"  (1.753 m), weight 264 lb 12.8 oz (120.1 kg). Body mass index is 39.1 kg/m.   She is very concerned about her work hours and that when she works past 5:30 she can't get to the gym and exercise as it makes her dinner too late. She is also concerned about some other stresses in her life that she wanted to discuss with me today. She is adjusting her meal insulin based on her carb choices as taught, and doing very well with that. She reports no hypoglycemia in the past month. She reports her A1c: 6.8% She is knowledgeable of carb counting as well.        Diabetes Self-Management Education - 09/21/16 0847      Health Coping   How would you rate your overall health? Poor     Psychosocial Assessment   Patient Belief/Attitude about Diabetes Defeat/Burnout   Self-management support CDE visits;Family   Patient Concerns Nutrition/Meal planning;Glycemic Control;Weight Control     Complications   Last HgB A1C per patient/outside source 6.7 %   Number of hypoglycemic episodes per month 2   Have you had a dilated eye exam in the past 12 months? Yes   Have you had a dental exam in the past 12 months? Yes   Are you checking your feet? Yes     Patient Education   Previous Diabetes Education Yes (please comment)     Patient Self-Evaluation of Goals - Patient rates self as meeting previously set goals (% of time)   Medications Not Applicable     Subsequent Visit   Since your last visit have you continued or begun to take your medications as prescribed? No  BP pill she has fears of hair loss    Since your last visit have you had your blood pressure checked? Yes   Is your most recent blood pressure lower, unchanged, or higher since your last  visit? Higher   Since your last visit, are you checking your blood glucose at least once a day? No      Learning Objective:  Patient will have a greater understanding of diabetes self-management. Patient education plan is to attend individual and/or group sessions per assessed needs and concerns.   Plan:   Patient Instructions  Plan:  You are concerned about not being able to get to the gym the last few weeks due to extra work hours and driving home at night. 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 You will resume going to the gym after work as soon as this is possible.   Continue to plan to have a carb choice with protein and no insulin prior to any exercise  Check BG before and after exercise for the first couple of weeks  Drink water before and after exercise  Continue taking 3 units for every Carb Choice (= 1 unit for every 5 grams). You stated today that is working well for you!  Consider using fast acting carbohydrate first when treating a low BG  Such as 4 pieces sugar candy, 4 oz regular soda or fruit juice or 4 glucose tablets, then follow with starch and protein     Expected Outcomes:  Education material provided: No new handouts today  If problems or questions, patient to contact team via:  Phone and Email  Future DSME appointment: -  PRN

## 2016-09-22 NOTE — Progress Notes (Signed)
She presents today with chief complaint of pain to her left foot along the medial side and the lateral side as well. States that he got burning and hurting so bad last week I had to put the boot on an left foot goes numb every night when I'm asleep. She denies any trauma to the foot. Relates back and sciatic pain.  Objective: Vital signs are stable alert and oriented 3 pulses are palpable. Neurologic sensorium is diminished versus Amy Adams monofilament secondary to diabetic peripheral neuropathy probably has some sciatic complications and radicular pain as well. She has tenderness on palpation of the peroneal tendons left and of the posterior tibial tendon right [place] likely this is associated with an extreme plantar flexor injury or spasm that she gets in her calves.  Assessment: Tendinitis peroneal tendon left posterior tibial tendon left. Diabetic peripheral neuropathy Amy Adams and radiculopathy.  Plan: I encouraged for her to continue to use her Cam Walker and I injected these areas today with dexamethasone and local anesthetic.

## 2016-09-22 NOTE — Telephone Encounter (Signed)
Pt states she forgot to ask Dr. Milinda Pointer if she needed to be in the boot the whole month until her next appt. Left message informing pt she should be in the boot at all times she is weight bearing, except to bath and sleep.

## 2016-09-28 MED FILL — FLUCONAZOLE 150 MG TABLET: 150 | 4 days supply | Qty: 2 | Fill #0

## 2016-10-05 DIAGNOSIS — M150 Primary generalized (osteo)arthritis: Secondary | ICD-10-CM | POA: Diagnosis not present

## 2016-10-05 DIAGNOSIS — L659 Nonscarring hair loss, unspecified: Secondary | ICD-10-CM | POA: Diagnosis not present

## 2016-10-05 DIAGNOSIS — M5136 Other intervertebral disc degeneration, lumbar region: Secondary | ICD-10-CM | POA: Diagnosis not present

## 2016-10-05 DIAGNOSIS — M0589 Other rheumatoid arthritis with rheumatoid factor of multiple sites: Secondary | ICD-10-CM | POA: Diagnosis not present

## 2016-10-05 DIAGNOSIS — Z79899 Other long term (current) drug therapy: Secondary | ICD-10-CM | POA: Diagnosis not present

## 2016-10-05 DIAGNOSIS — M79631 Pain in right forearm: Secondary | ICD-10-CM | POA: Diagnosis not present

## 2016-10-20 ENCOUNTER — Encounter: Payer: Self-pay | Admitting: Podiatry

## 2016-10-20 ENCOUNTER — Other Ambulatory Visit: Payer: Self-pay | Admitting: Podiatry

## 2016-10-20 ENCOUNTER — Ambulatory Visit (INDEPENDENT_AMBULATORY_CARE_PROVIDER_SITE_OTHER): Payer: 59 | Admitting: Podiatry

## 2016-10-20 DIAGNOSIS — M7750 Other enthesopathy of unspecified foot: Secondary | ICD-10-CM | POA: Diagnosis not present

## 2016-10-20 DIAGNOSIS — E1142 Type 2 diabetes mellitus with diabetic polyneuropathy: Secondary | ICD-10-CM

## 2016-10-20 MED FILL — ATORVASTATIN 40 MG TABLET: 40 | 90 days supply | Qty: 90 | Fill #0

## 2016-10-20 MED FILL — HYDROCHLOROTHIAZIDE 25 MG T: 25 | 90 days supply | Qty: 90 | Fill #1

## 2016-10-20 MED FILL — LANTUS SOLOSTAR 100 UNITS/M: 100 | 90 days supply | Qty: 135 | Fill #1

## 2016-10-20 MED FILL — VENTOLIN HFA 90 MCG INHALER: 108 (90 BAS | 90 days supply | Qty: 54 | Fill #1

## 2016-10-20 MED FILL — FOLIC ACID 1 MG TABLET: 1 | 90 days supply | Qty: 180 | Fill #1

## 2016-10-20 MED FILL — METHOTREXATE 25 MG/ML VIAL: 50 | 84 days supply | Qty: 10 | Fill #1

## 2016-10-20 MED FILL — AMLODIPINE BESYLATE 5 MG TA: 5 | 90 days supply | Qty: 90 | Fill #0

## 2016-10-20 MED FILL — FENOFIBRATE 134 MG CAPSULE: 134 | 90 days supply | Qty: 90 | Fill #1

## 2016-10-20 MED FILL — LEVOTHYROXINE 50 MCG TABLET: 50 | 90 days supply | Qty: 90 | Fill #1

## 2016-10-20 MED FILL — LEVOCETIRIZINE 5 MG TABLET: 5 | 90 days supply | Qty: 90 | Fill #1

## 2016-10-20 MED FILL — LOSARTAN POTASSIUM 100 MG T: 100 | 90 days supply | Qty: 90 | Fill #0

## 2016-10-20 MED FILL — OMEPRAZOLE DR 40 MG CAPSULE: 40 | 90 days supply | Qty: 180 | Fill #0

## 2016-10-20 MED FILL — HYDROXYCHLOROQUINE 200 MG T: 200 | 90 days supply | Qty: 180 | Fill #0

## 2016-10-20 MED FILL — HUMALOG 100 UNITS/ML KWIKPE: 100 | 90 days supply | Qty: 90 | Fill #1

## 2016-10-20 MED FILL — METFORMIN HCL ER 500 MG TAB: 500 | 90 days supply | Qty: 360 | Fill #0

## 2016-10-20 MED FILL — MONTELUKAST SOD 10 MG TAB: 10 | 90 days supply | Qty: 90 | Fill #1

## 2016-10-20 MED FILL — DULoxetine HCL 60 MG CPEP: 60 | 90 days supply | Qty: 180 | Fill #0

## 2016-10-21 ENCOUNTER — Telehealth: Payer: Self-pay | Admitting: *Deleted

## 2016-10-21 MED ORDER — CYCLOBENZAPRINE HCL 10 MG PO TABS: 10.0000 mg | ORAL_TABLET | Freq: Three times a day (TID) | ORAL | 1 refills | Status: DC

## 2016-10-21 MED FILL — ZOLPIDEM TART ER 12.5 MG TA: 12.5 | 90 days supply | Qty: 90 | Fill #1

## 2016-10-21 MED FILL — GABAPENTIN 400 MG CAPSULE: 400 | 90 days supply | Qty: 270 | Fill #1

## 2016-10-21 MED FILL — ESTRADIOL 0.5 MG TABLET: 0.5 | 90 days supply | Qty: 45 | Fill #0

## 2016-10-21 MED FILL — ALPRAZolam 1 MG TABS: 1 | 90 days supply | Qty: 270 | Fill #0

## 2016-10-21 NOTE — Telephone Encounter (Signed)
Pt states forgot to ask for the medication for the spasms in her foot and would like a 90 day supply called to [facility].

## 2016-10-23 NOTE — Progress Notes (Signed)
She presents today for follow-up of her pain to her left lower extremity. She states that she still has tenderness along the medial plantar [place] region where her orthotic rides. She's also complaining of pain to the lateral aspect of the foot.  Objective: Vital signs are stable she is alert and oriented 3 relates that her blood sugar is a little bit elevated. Pulses are palpable. No reproducible pain on palpation left foot.  Assessment: Diabetic peripheral neuropathy posterior tibial tendinitis. Peroneal tendinitis  Plan: We'll make her a new pair of diabetic orthotics and I will follow up with her once the cement.

## 2016-10-23 NOTE — Telephone Encounter (Signed)
Refilled previous prescription

## 2016-10-27 MED FILL — traMADol HCL 50 MG TABS: 50 | 90 days supply | Qty: 270 | Fill #1

## 2016-10-28 MED FILL — CYCLOBENZAPRINE 10 MG TAB: 10 | 30 days supply | Qty: 90 | Fill #0

## 2016-11-17 ENCOUNTER — Ambulatory Visit (INDEPENDENT_AMBULATORY_CARE_PROVIDER_SITE_OTHER): Payer: 59 | Admitting: Podiatry

## 2016-11-17 ENCOUNTER — Encounter: Payer: Self-pay | Admitting: Podiatry

## 2016-11-17 DIAGNOSIS — M7750 Other enthesopathy of unspecified foot: Secondary | ICD-10-CM | POA: Diagnosis not present

## 2016-11-17 NOTE — Progress Notes (Signed)
She presents today to pick up her orthotics. She was given by Nyoka Cowden home-going instructions for care and wearing of the orthotics I will follow up with her in 4-6 weeks for reevaluation.

## 2016-11-18 ENCOUNTER — Ambulatory Visit: Payer: Self-pay

## 2016-11-23 ENCOUNTER — Other Ambulatory Visit: Payer: Self-pay | Admitting: Endocrinology

## 2016-11-23 DIAGNOSIS — E1165 Type 2 diabetes mellitus with hyperglycemia: Secondary | ICD-10-CM | POA: Diagnosis not present

## 2016-11-23 DIAGNOSIS — E049 Nontoxic goiter, unspecified: Secondary | ICD-10-CM

## 2016-11-23 DIAGNOSIS — E7800 Pure hypercholesterolemia, unspecified: Secondary | ICD-10-CM | POA: Diagnosis not present

## 2016-11-23 DIAGNOSIS — E039 Hypothyroidism, unspecified: Secondary | ICD-10-CM | POA: Diagnosis not present

## 2016-11-23 DIAGNOSIS — G609 Hereditary and idiopathic neuropathy, unspecified: Secondary | ICD-10-CM | POA: Diagnosis not present

## 2016-11-24 DIAGNOSIS — Z79899 Other long term (current) drug therapy: Secondary | ICD-10-CM | POA: Diagnosis not present

## 2016-11-24 DIAGNOSIS — H524 Presbyopia: Secondary | ICD-10-CM | POA: Diagnosis not present

## 2016-11-24 DIAGNOSIS — H40013 Open angle with borderline findings, low risk, bilateral: Secondary | ICD-10-CM | POA: Diagnosis not present

## 2016-12-12 ENCOUNTER — Other Ambulatory Visit (HOSPITAL_COMMUNITY): Payer: Self-pay | Admitting: Endocrinology

## 2016-12-12 DIAGNOSIS — E049 Nontoxic goiter, unspecified: Secondary | ICD-10-CM

## 2016-12-13 ENCOUNTER — Ambulatory Visit (INDEPENDENT_AMBULATORY_CARE_PROVIDER_SITE_OTHER): Payer: 59 | Admitting: Podiatry

## 2016-12-13 ENCOUNTER — Encounter: Payer: Self-pay | Admitting: Podiatry

## 2016-12-13 DIAGNOSIS — E1142 Type 2 diabetes mellitus with diabetic polyneuropathy: Secondary | ICD-10-CM

## 2016-12-13 DIAGNOSIS — M7750 Other enthesopathy of unspecified foot: Secondary | ICD-10-CM

## 2016-12-13 MED ORDER — TRAMADOL HCL 50 MG PO TABS: 50.0000 mg | ORAL_TABLET | Freq: Three times a day (TID) | ORAL | 0 refills | Status: DC | PRN

## 2016-12-13 MED ORDER — GABAPENTIN 400 MG PO CAPS: 400.0000 mg | ORAL_CAPSULE | Freq: Three times a day (TID) | ORAL | 3 refills | Status: DC

## 2016-12-13 NOTE — Progress Notes (Signed)
She presents today for follow-up of her posterior tibial tendinitis and spasms in her left foot. She states that she is doing some better and the orthotics seem to be helping her walk much better.  Objective: Vital signs are stable she is alert and oriented 3. Pulses are strongly palpable. She has much decrease in swelling and pain on palpation of the distal most aspect of the surgical corrected posterior tibial tendon left.  Assessment: Well-healing surgical foot left.  Plan: Follow up with me in 6 months if necessary. Refill her Neurontin and her tramadol today.

## 2016-12-19 ENCOUNTER — Ambulatory Visit (HOSPITAL_COMMUNITY): Payer: 59

## 2016-12-21 ENCOUNTER — Ambulatory Visit (HOSPITAL_COMMUNITY): Payer: 59

## 2016-12-30 ENCOUNTER — Ambulatory Visit: Payer: Self-pay

## 2017-01-05 DIAGNOSIS — E669 Obesity, unspecified: Secondary | ICD-10-CM | POA: Diagnosis not present

## 2017-01-05 DIAGNOSIS — L659 Nonscarring hair loss, unspecified: Secondary | ICD-10-CM | POA: Diagnosis not present

## 2017-01-05 DIAGNOSIS — M79631 Pain in right forearm: Secondary | ICD-10-CM | POA: Diagnosis not present

## 2017-01-05 DIAGNOSIS — Z6839 Body mass index (BMI) 39.0-39.9, adult: Secondary | ICD-10-CM | POA: Diagnosis not present

## 2017-01-05 DIAGNOSIS — M0589 Other rheumatoid arthritis with rheumatoid factor of multiple sites: Secondary | ICD-10-CM | POA: Diagnosis not present

## 2017-01-05 DIAGNOSIS — M150 Primary generalized (osteo)arthritis: Secondary | ICD-10-CM | POA: Diagnosis not present

## 2017-01-05 DIAGNOSIS — M7989 Other specified soft tissue disorders: Secondary | ICD-10-CM | POA: Diagnosis not present

## 2017-01-05 DIAGNOSIS — M5136 Other intervertebral disc degeneration, lumbar region: Secondary | ICD-10-CM | POA: Diagnosis not present

## 2017-01-05 DIAGNOSIS — Z79899 Other long term (current) drug therapy: Secondary | ICD-10-CM | POA: Diagnosis not present

## 2017-01-06 ENCOUNTER — Ambulatory Visit: Payer: Self-pay

## 2017-01-10 DIAGNOSIS — E782 Mixed hyperlipidemia: Secondary | ICD-10-CM | POA: Diagnosis not present

## 2017-01-20 ENCOUNTER — Other Ambulatory Visit: Payer: Self-pay

## 2017-01-20 VITALS — BP 132/80 | HR 88 | Resp 16 | Ht 69.0 in | Wt 266.4 lb

## 2017-01-20 DIAGNOSIS — E118 Type 2 diabetes mellitus with unspecified complications: Secondary | ICD-10-CM

## 2017-01-20 DIAGNOSIS — Z794 Long term (current) use of insulin: Secondary | ICD-10-CM

## 2017-01-20 NOTE — Patient Instructions (Signed)
1. Plan to eat 30-45 GM (2-3) servings of carbohydrate a meal and 15 GM for snacks.  Plan to eat protein with your snacks 2. Plan to check blood sugar once a day fasting or 1 -2hrs after a meal.  Goals of 80-130 fasting and 180 or less after eating. 3. Plan to gym 3 times a week 30-60 minutes  4. Plan to return to Link to Wellness on 04/28/17 at 1PM

## 2017-01-20 NOTE — Patient Outreach (Signed)
[facility]) Care Management   01/20/2017  Amy Adams 1966/01/03 [id number]  Amy Adams is an 51 y.o. female  Member seen for follow up office visit for Link to Wellness program for self management of Type 2 diabetes  Subjective: Member states that she has been doing good.  States that her joint pain has good days and bad days.  States she is trying to go to the gym 3-4 times a week or she will walk.  States she is not checking her blood sugars regularly and only checks if she feels it is low.  States she Dr. Chalmers Cater a month or so ago and her hemoglobin A1C was up to 7.2%.  States that she increased her Lantus insulin.  States that she thinks it went up due to the holidays and she expects it to be better now.  Objective:   Review of Systems  Musculoskeletal: Positive for joint pain.    Physical Exam Today's Vitals   01/20/17 1311 01/20/17 1320  BP: 132/80   Pulse: 88   Resp: 16   SpO2: 98%   Weight: 266 lb 6.4 oz (120.8 kg)   Height: 1.753 m (5\' 9" )   PainSc: 0-No pain 0-No pain   Encounter Medications:   Outpatient Encounter Prescriptions as of 01/20/2017  Medication Sig  . albuterol (PROVENTIL HFA;VENTOLIN HFA) 108 (90 BASE) MCG/ACT inhaler Inhale 1-2 puffs into the lungs every 6 (six) hours as needed for wheezing or shortness of breath.  . ALPRAZolam (XANAX) 1 MG tablet Take 1 mg by mouth 3 (three) times daily as needed for sleep.  Marland Kitchen amLODipine (NORVASC) 10 MG tablet Take 10 mg by mouth daily.  Marland Kitchen atorvastatin (LIPITOR) 40 MG tablet Take 40 mg by mouth daily.  . cyclobenzaprine (FLEXERIL) 10 MG tablet Take 1 tablet (10 mg total) by mouth 3 (three) times daily.  . Dietary Management Product (RHEUMATE) CAPS Take 1 capsule by mouth daily.  . DULoxetine (CYMBALTA) 60 MG capsule Take 120 mg by mouth at bedtime.  Marland Kitchen estradiol (ESTRACE) 0.5 MG tablet Take 0.5 mg by mouth daily.  . fenofibrate micronized (LOFIBRA) 134 MG capsule Take 134 mg by mouth daily.  .  folic acid (FOLVITE) 1 MG tablet Take 2 mg by mouth daily.   Marland Kitchen gabapentin (NEURONTIN) 400 MG capsule Take 1 capsule (400 mg total) by mouth 3 (three) times daily.  . hydrochlorothiazide (HYDRODIURIL) 25 MG tablet Take 25 mg by mouth daily.  . hydroxychloroquine (PLAQUENIL) 200 MG tablet Take 200 mg by mouth 2 (two) times daily.  Marland Kitchen ibuprofen (ADVIL,MOTRIN) 800 MG tablet Take 1 tablet (800 mg total) by mouth every 8 (eight) hours as needed.  . insulin glargine (LANTUS SOLOSTAR) 100 UNIT/ML injection Inject 55 Units into the skin 2 (two) times daily.   . insulin lispro (HUMALOG KWIKPEN) 100 UNIT/ML injection Inject 12 Units into the skin 3 (three) times daily before meals. 12-15 units sliding scale  . levocetirizine (XYZAL) 5 MG tablet Take 5 mg by mouth every evening.  Marland Kitchen levothyroxine (SYNTHROID, LEVOTHROID) 50 MCG tablet Take 50 mcg by mouth daily.  Marland Kitchen losartan (COZAAR) 100 MG tablet Take 100 mg by mouth daily.  . metFORMIN (GLUCOPHAGE-XR) 500 MG 24 hr tablet Take 1,000 mg by mouth 2 (two) times daily.   . methotrexate 25 MG/ML SOLN 20 mg once a week. Inject 0.8 mL once weekly at bedtime. Every Thursday  . montelukast (SINGULAIR) 10 MG tablet Take 10 mg by mouth at bedtime.  Marland Kitchen  omeprazole (PRILOSEC) 40 MG capsule Take 40 mg by mouth 2 (two) times daily.  . traMADol (ULTRAM) 50 MG tablet Take 1 tablet (50 mg total) by mouth every 8 (eight) hours as needed.  . zolpidem (AMBIEN CR) 12.5 MG CR tablet Take 12.5 mg by mouth at bedtime as needed for sleep.   No facility-administered encounter medications on file as of 01/20/2017.     Functional Status:   In your present state of health, do you have any difficulty performing the following activities: 01/20/2017 08/12/2016  Hearing? N N  Vision? N N  Difficulty concentrating or making decisions? N N  Walking or climbing stairs? N N  Dressing or bathing? N N  Doing errands, shopping? - N  Some recent data might be hidden    Fall/Depression Screening:     PHQ 2/9 Scores 01/20/2017 08/12/2016 02/05/2016 12/24/2015 09/21/2015 09/21/2015 06/09/2015  PHQ - 2 Score 0 0 0 0 0 0 -  Exception Documentation - - - - - - Patient refusal    Assessment:   Member seen for follow up office visit for Link to Wellness program for self management of Type 2 diabetes. Member is not meeting diabetes self management goal of 7% or below with last reading of 7.2%. Member reports going to gym 3 times a week and walking.  Reports trying to follow low CHO diet and taking insulin as directed. Member not checking blood sugars regularly Member up to date with annual eye exam and dental check ups.  Plan:  1. Plan to eat 30-45 GM (2-3) servings of carbohydrate a meal and 15 GM for snacks.  Plan to eat protein with your snacks 2. Plan to check blood sugar once a day fasting or 1 -2hrs after a meal.  Goals of 80-130 fasting and 180 or less after eating. 3. Plan to gym 3 times a week 30-60 minutes  4. Plan to return to Link to Wellness on 04/28/17 at [place] Problem [facility]   Flowsheet Row Most Recent Value  Care Plan Problem One  Potential for elevated blood sugars related to dx of Type  2 DM  Role Documenting the Problem One  Care Management Coordinator  Care Plan for Problem One  Active  THN Long Term Goal (31-90 days)  Member will decrease hemoglobin A1C to 7 or below in the next 90 days  THN Long Term Goal Start Date  01/20/17 [Hemoglobin A1C increased to 7.2%]  Interventions for Problem One Long Term Goal  Reviewed CHO counting and portion control, Reveiwed  making wise choices when eating fast foods, Encouraged to go to the gym regularly and instructed on the benefits of exercise for glycemic control, Discussed getting an activity tracker Reinforced to check her blood sugars more frequently as she is taking meal time insulin,  Instructed to keep appointment with Dr. Kenton Kingfisher, Reviewed blood sugar goals

## 2017-01-23 DIAGNOSIS — E1142 Type 2 diabetes mellitus with diabetic polyneuropathy: Secondary | ICD-10-CM | POA: Diagnosis not present

## 2017-01-23 DIAGNOSIS — J4520 Mild intermittent asthma, uncomplicated: Secondary | ICD-10-CM | POA: Diagnosis not present

## 2017-01-23 DIAGNOSIS — E039 Hypothyroidism, unspecified: Secondary | ICD-10-CM | POA: Diagnosis not present

## 2017-01-23 DIAGNOSIS — G43909 Migraine, unspecified, not intractable, without status migrainosus: Secondary | ICD-10-CM | POA: Diagnosis not present

## 2017-01-23 DIAGNOSIS — M069 Rheumatoid arthritis, unspecified: Secondary | ICD-10-CM | POA: Diagnosis not present

## 2017-01-23 DIAGNOSIS — E782 Mixed hyperlipidemia: Secondary | ICD-10-CM | POA: Diagnosis not present

## 2017-01-23 DIAGNOSIS — K219 Gastro-esophageal reflux disease without esophagitis: Secondary | ICD-10-CM | POA: Diagnosis not present

## 2017-01-23 DIAGNOSIS — G4700 Insomnia, unspecified: Secondary | ICD-10-CM | POA: Diagnosis not present

## 2017-01-23 DIAGNOSIS — I10 Essential (primary) hypertension: Secondary | ICD-10-CM | POA: Diagnosis not present

## 2017-01-23 MED FILL — SUMATRIPTAN SUCC 100 MG TAB: 100 | 30 days supply | Qty: 18 | Fill #0

## 2017-01-23 MED FILL — CYCLOBENZAPRINE 10 MG TAB: 10 | 30 days supply | Qty: 90 | Fill #1

## 2017-01-23 MED FILL — traMADol HCL 50 MG TABS: 50 | 90 days supply | Qty: 180 | Fill #0

## 2017-01-23 MED FILL — FOLIC ACID 1 MG TABLET: 1 | 90 days supply | Qty: 180 | Fill #2

## 2017-01-23 MED FILL — LEVOTHYROXINE 50 MCG TABLET: 50 | 90 days supply | Qty: 90 | Fill #2

## 2017-01-23 MED FILL — HYDROCHLOROTHIAZIDE 25 MG T: 25 | 90 days supply | Qty: 90 | Fill #0

## 2017-01-23 MED FILL — METHOTREXATE 25 MG/ML VIAL: 50 | 84 days supply | Qty: 10 | Fill #2

## 2017-01-23 MED FILL — HYDROXYCHLOROQUINE 200 MG T: 200 | 90 days supply | Qty: 180 | Fill #1

## 2017-01-23 MED FILL — GABAPENTIN 400 MG CAPSULE: 400 | 90 days supply | Qty: 270 | Fill #0

## 2017-01-23 MED FILL — ESTRADIOL 0.5 MG TABLET: 0.5 | 30 days supply | Qty: 15 | Fill #0

## 2017-01-23 MED FILL — FENOFIBRATE 134 MG CAPSULE: 134 | 90 days supply | Qty: 90 | Fill #2

## 2017-01-23 MED FILL — LANTUS SOLOSTAR 100 UNITS/M: 100 | 90 days supply | Qty: 135 | Fill #2

## 2017-01-24 MED FILL — AMLODIPINE BESYLATE 5 MG TA: 5 | 90 days supply | Qty: 90 | Fill #0

## 2017-01-24 MED FILL — DULoxetine HCL 60 MG CPEP: 60 | 90 days supply | Qty: 180 | Fill #0

## 2017-01-24 MED FILL — ALPRAZolam 1 MG TABS: 1 | 90 days supply | Qty: 270 | Fill #0

## 2017-01-24 MED FILL — MONTELUKAST SOD 10 MG TAB: 10 | 90 days supply | Qty: 90 | Fill #0

## 2017-01-24 MED FILL — OMEPRAZOLE DR 40 MG CAPSULE: 40 | 90 days supply | Qty: 180 | Fill #0

## 2017-01-24 MED FILL — ATORVASTATIN 40 MG TABLET: 40 | 90 days supply | Qty: 90 | Fill #0

## 2017-01-24 MED FILL — METFORMIN HCL ER 500 MG TAB: 500 | 90 days supply | Qty: 360 | Fill #0

## 2017-01-24 MED FILL — ZOLPIDEM TART ER 12.5 MG TA: 12.5 | 90 days supply | Qty: 90 | Fill #0

## 2017-01-24 MED FILL — LOSARTAN POTASSIUM 100 MG T: 100 | 90 days supply | Qty: 90 | Fill #0

## 2017-02-02 ENCOUNTER — Encounter: Payer: 59 | Attending: Family Medicine | Admitting: *Deleted

## 2017-02-02 DIAGNOSIS — E118 Type 2 diabetes mellitus with unspecified complications: Secondary | ICD-10-CM | POA: Diagnosis not present

## 2017-02-02 DIAGNOSIS — Z713 Dietary counseling and surveillance: Secondary | ICD-10-CM | POA: Insufficient documentation

## 2017-02-02 DIAGNOSIS — Z794 Long term (current) use of insulin: Secondary | ICD-10-CM | POA: Insufficient documentation

## 2017-02-02 DIAGNOSIS — E1142 Type 2 diabetes mellitus with diabetic polyneuropathy: Secondary | ICD-10-CM

## 2017-02-02 NOTE — Progress Notes (Signed)
Diabetes Self-Management Education  Visit Type:     Appt. Start Time: 0930 Appt. End Time: 1000  02/09/2017  [name], identified by name and date of birth, is a 51 y.o. female with a diagnosis of Diabetes:  .   ASSESSMENT: She is satisfied with her work hours which she was concerned about previously. She now has some personal stresses in her life that are affecting her appetite. She is eating much less and less often. She is adjusting her meal insulin based on her carb choices as taught, and that she has not had to take any for the past 10 days. She has reduced her Lantus as well, current dose is at 35 units per day. She reports no hypoglycemia in the past month. She reports her A1c: 6.9% She is knowledgeable of carb counting as well.    Height 5' 9.5" (1.765 m), weight 259 lb 4.8 oz (117.6 kg). Body mass index is 37.74 kg/m.   Learning Objective:  Patient will have a greater understanding of diabetes self-management. Patient education plan is to attend individual and/or group sessions per assessed needs and concerns.  Plan:   Patient Instructions  Plan:.   Continue to plan to have a carb choice with protein and no insulin prior to any exercise  Check BG before and after exercise for the first couple of weeks  Drink water before and after exercise  You state that with your significantly decreased appetite, you are not needing any meal time insulin right now and your Lantus dose has been reduced to 35 units per day.  Expected Outcomes:     Education material provided: No new handouts today  If problems or questions, patient to contact team via:  Phone and Email  Future DSME appointment: -  PRN

## 2017-02-09 NOTE — Patient Instructions (Signed)
Plan:.   Continue to plan to have a carb choice with protein and no insulin prior to any exercise  Check BG before and after exercise for the first couple of weeks  Drink water before and after exercise  You state that with your significantly decreased appetite, you are not needing any meal time insulin right now and your Lantus dose has been reduced to 35 units per day.

## 2017-03-17 ENCOUNTER — Ambulatory Visit
Admission: RE | Admit: 2017-03-17 | Discharge: 2017-03-17 | Disposition: A | Discharge status: Home / Self Care | Payer: 59 | Source: Ambulatory Visit | Attending: Endocrinology | Admitting: Endocrinology

## 2017-03-17 DIAGNOSIS — E042 Nontoxic multinodular goiter: Secondary | ICD-10-CM | POA: Diagnosis not present

## 2017-03-17 DIAGNOSIS — E049 Nontoxic goiter, unspecified: Secondary | ICD-10-CM

## 2017-03-24 ENCOUNTER — Other Ambulatory Visit: Payer: Self-pay | Admitting: Endocrinology

## 2017-03-24 DIAGNOSIS — E049 Nontoxic goiter, unspecified: Secondary | ICD-10-CM

## 2017-04-19 ENCOUNTER — Other Ambulatory Visit: Payer: Self-pay | Admitting: Endocrinology

## 2017-04-19 ENCOUNTER — Ambulatory Visit
Admission: RE | Admit: 2017-04-19 | Discharge: 2017-04-19 | Disposition: A | Discharge status: Home / Self Care | Payer: 59 | Source: Ambulatory Visit | Attending: Endocrinology | Admitting: Endocrinology

## 2017-04-19 DIAGNOSIS — E042 Nontoxic multinodular goiter: Secondary | ICD-10-CM | POA: Diagnosis not present

## 2017-04-19 DIAGNOSIS — E049 Nontoxic goiter, unspecified: Secondary | ICD-10-CM

## 2017-04-25 DIAGNOSIS — M0589 Other rheumatoid arthritis with rheumatoid factor of multiple sites: Secondary | ICD-10-CM | POA: Diagnosis not present

## 2017-04-25 DIAGNOSIS — Z79899 Other long term (current) drug therapy: Secondary | ICD-10-CM | POA: Diagnosis not present

## 2017-04-25 DIAGNOSIS — M150 Primary generalized (osteo)arthritis: Secondary | ICD-10-CM | POA: Diagnosis not present

## 2017-04-25 DIAGNOSIS — M79631 Pain in right forearm: Secondary | ICD-10-CM | POA: Diagnosis not present

## 2017-04-25 DIAGNOSIS — M5136 Other intervertebral disc degeneration, lumbar region: Secondary | ICD-10-CM | POA: Diagnosis not present

## 2017-04-25 DIAGNOSIS — Z6835 Body mass index (BMI) 35.0-35.9, adult: Secondary | ICD-10-CM | POA: Diagnosis not present

## 2017-04-25 DIAGNOSIS — L659 Nonscarring hair loss, unspecified: Secondary | ICD-10-CM | POA: Diagnosis not present

## 2017-04-25 DIAGNOSIS — E669 Obesity, unspecified: Secondary | ICD-10-CM | POA: Diagnosis not present

## 2017-04-25 DIAGNOSIS — M2550 Pain in unspecified joint: Secondary | ICD-10-CM | POA: Diagnosis not present

## 2017-04-25 MED FILL — DICLOFENAC SODIUM 1% GEL: 1 | 30 days supply | Qty: 200 | Fill #0

## 2017-04-28 ENCOUNTER — Other Ambulatory Visit: Payer: Self-pay

## 2017-04-28 VITALS — BP 112/80 | HR 95 | Resp 16 | Ht 69.0 in | Wt 245.8 lb

## 2017-04-28 DIAGNOSIS — E118 Type 2 diabetes mellitus with unspecified complications: Secondary | ICD-10-CM

## 2017-04-28 DIAGNOSIS — Z794 Long term (current) use of insulin: Secondary | ICD-10-CM

## 2017-04-28 MED FILL — HYDROXYCHLOROQUINE 200 MG T: 200 | 90 days supply | Qty: 180 | Fill #2

## 2017-04-28 MED FILL — FOLIC ACID 1 MG TABLET: 1 | 90 days supply | Qty: 180 | Fill #3

## 2017-04-28 MED FILL — AMLODIPINE BESYLATE 5 MG TA: 5 | 90 days supply | Qty: 90 | Fill #1

## 2017-04-28 MED FILL — ZOLPIDEM TART ER 12.5 MG TA: 12.5 | 90 days supply | Qty: 90 | Fill #1

## 2017-04-28 MED FILL — METFORMIN HCL ER 500 MG TAB: 500 | 90 days supply | Qty: 360 | Fill #1

## 2017-04-28 MED FILL — METHOTREXATE 25 MG/ML VIAL: 50 | 30 days supply | Qty: 4 | Fill #0

## 2017-04-28 MED FILL — DULoxetine HCL 60 MG CPEP: 60 | 90 days supply | Qty: 180 | Fill #1

## 2017-04-28 MED FILL — ATORVASTATIN 40 MG TABLET: 40 | 90 days supply | Qty: 90 | Fill #1

## 2017-04-28 MED FILL — HYDROCHLOROTHIAZIDE 25 MG T: 25 | 90 days supply | Qty: 90 | Fill #1

## 2017-04-28 MED FILL — ESTRADIOL 0.5 MG TABLET: 0.5 | 90 days supply | Qty: 45 | Fill #0

## 2017-04-28 MED FILL — traMADol HCL 50 MG TABS: 50 | 90 days supply | Qty: 180 | Fill #0

## 2017-04-28 MED FILL — ALPRAZolam 1 MG TABS: 1 | 90 days supply | Qty: 270 | Fill #1

## 2017-04-28 MED FILL — OMEPRAZOLE DR 40 MG CAPSULE: 40 | 90 days supply | Qty: 180 | Fill #1

## 2017-04-28 MED FILL — MONTELUKAST SOD 10 MG TAB: 10 | 90 days supply | Qty: 90 | Fill #1

## 2017-04-28 MED FILL — GABAPENTIN 400 MG CAPSULE: 400 | 90 days supply | Qty: 270 | Fill #1

## 2017-04-28 NOTE — Patient Instructions (Signed)
1. Plan to eat 30-45 GM (2-3) servings of carbohydrate a meal and 15 GM for snacks.  Plan to eat protein with your snacks 2. Plan to check blood sugar once a day fasting or 1 -2hrs after a meal.  Goals of 80-130 fasting and 180 or less after eating. 3. Plan to gym 3 times a week 30-60 minutes  4. Plan to return to Link to Wellness on 07/28/17 at [place]

## 2017-04-28 NOTE — Patient Outreach (Signed)
[place] [facility]) Care Management   04/28/2017  Amy Adams June 05, 1966 [id number]  Amy Adams is an 51 y.o. female.   Member seen for follow up office visit for Link to Wellness program for self management of Type 2 diabetes  Subjective: member states that she has had a lot of stress the last few weeks as she found out that her partner has been unfaithful.  States that she has gone to [employer] [facility] this week.  States that has helped.  States she has not been taking care of herself but she has been trying to eat properly and take her medications.  States she has not been to the gym this last week.  States she has lost 20 lbs in the last 3 months and she has been going to the gym 2-3 times a week.    Objective:   ROS  Physical Exam Today's Vitals   04/28/17 1326 04/28/17 1339  BP: 112/80   Pulse: 95   Resp: 16   SpO2: 98%   Weight: 245 lb 12.8 oz (111.5 kg)   Height: 1.753 m (5\' 9" )   PainSc: 0-No pain 0-No pain   Encounter Medications:   Outpatient Encounter Prescriptions as of 04/28/2017  Medication Sig  . albuterol (PROVENTIL HFA;VENTOLIN HFA) 108 (90 BASE) MCG/ACT inhaler Inhale 1-2 puffs into the lungs every 6 (six) hours as needed for wheezing or shortness of breath.  . ALPRAZolam (XANAX) 1 MG tablet Take 1 mg by mouth 3 (three) times daily as needed for sleep.  Marland Kitchen amLODipine (NORVASC) 10 MG tablet Take 10 mg by mouth daily.  Marland Kitchen atorvastatin (LIPITOR) 40 MG tablet Take 40 mg by mouth daily.  . cyclobenzaprine (FLEXERIL) 10 MG tablet Take 1 tablet (10 mg total) by mouth 3 (three) times daily.  . Dietary Management Product (RHEUMATE) CAPS Take 1 capsule by mouth daily.  . DULoxetine (CYMBALTA) 60 MG capsule Take 120 mg by mouth at bedtime.  Marland Kitchen estradiol (ESTRACE) 0.5 MG tablet Take 0.5 mg by mouth daily.  . fenofibrate micronized (LOFIBRA) 134 MG capsule Take 134 mg by mouth daily.  . folic acid (FOLVITE) 1 MG tablet Take 2 mg by mouth daily.    Marland Kitchen gabapentin (NEURONTIN) 400 MG capsule Take 1 capsule (400 mg total) by mouth 3 (three) times daily.  . hydrochlorothiazide (HYDRODIURIL) 25 MG tablet Take 25 mg by mouth daily.  . hydroxychloroquine (PLAQUENIL) 200 MG tablet Take 200 mg by mouth 2 (two) times daily.  . insulin glargine (LANTUS SOLOSTAR) 100 UNIT/ML injection Inject 55 Units into the skin 2 (two) times daily.   . insulin lispro (HUMALOG KWIKPEN) 100 UNIT/ML injection Inject 12 Units into the skin 3 (three) times daily before meals. 12-15 units sliding scale  . levocetirizine (XYZAL) 5 MG tablet Take 5 mg by mouth every evening.  Marland Kitchen levothyroxine (SYNTHROID, LEVOTHROID) 50 MCG tablet Take 50 mcg by mouth daily.  Marland Kitchen losartan (COZAAR) 100 MG tablet Take 100 mg by mouth daily.  . metFORMIN (GLUCOPHAGE-XR) 500 MG 24 hr tablet Take 1,000 mg by mouth 2 (two) times daily.   . methotrexate 25 MG/ML SOLN 20 mg once a week. Inject 0.8 mL once weekly at bedtime. Every Thursday  . montelukast (SINGULAIR) 10 MG tablet Take 10 mg by mouth at bedtime.  Marland Kitchen omeprazole (PRILOSEC) 40 MG capsule Take 40 mg by mouth 2 (two) times daily.  . traMADol (ULTRAM) 50 MG tablet Take 1 tablet (50 mg total) by mouth every  8 (eight) hours as needed.  . zolpidem (AMBIEN CR) 12.5 MG CR tablet Take 12.5 mg by mouth at bedtime as needed for sleep.  Marland Kitchen ibuprofen (ADVIL,MOTRIN) 800 MG tablet Take 1 tablet (800 mg total) by mouth every 8 (eight) hours as needed. (Patient not taking: Reported on 04/28/2017)   No facility-administered encounter medications on file as of 04/28/2017.     Functional Status:   In your present state of health, do you have any difficulty performing the following activities: 04/28/2017 01/20/2017  Hearing? N N  Vision? N N  Difficulty concentrating or making decisions? N N  Walking or climbing stairs? N N  Dressing or bathing? N N  Doing errands, shopping? N -  Some recent data might be hidden    Fall/Depression Screening:    Fall Risk   04/28/2017 01/20/2017 08/12/2016  Falls in the past year? No No No  Number falls in past yr: - - -  Injury with Fall? - - -  Risk Factor Category  - - -  Risk for fall due to : - - -  Follow up - - -   PHQ 2/9 Scores 04/28/2017 01/20/2017 08/12/2016 02/05/2016 12/24/2015 09/21/2015 09/21/2015  PHQ - 2 Score 1 0 0 0 0 0 0  Exception Documentation - - - - - - -    Assessment:  Member seen for follow up office visit for Link to Wellness program for self management of Type 2 diabetes. Member ismeeting diabetes self management goal of 7%or below with last reading of 6.9%. Member reports going to gym 3 times a week and walking. Reports trying tofollow low CHO diet and taking insulin as directed. Member not checking blood sugars regularlyMember up to date with annual eye exam and dental check ups.  Member under stress with family issues and is being seen by counseling.   Plan:   Plan to eat 30-45 GM (2-3) servings of carbohydrate a meal and 15 GM for snacks.  Plan to eat protein with your snacks Plan to check blood sugar once a day fasting or 1 -2hrs after a meal.  Goals of 80-130 fasting and 180 or less after eating. Plan to gym 3 times a week 30-60 minutes  Plan to return to Link to Wellness on 07/28/17 at [place] Problem [facility]     Most Recent Value  Care Plan Problem One  Potential for elevated blood sugars related to dx of Type  2 DM  Role Documenting the Problem One  Care Management [place] for Problem One  Active  [facility] Long Term Goal   Member will maintain hemoglobin A1C of 7% or below in the next 90 days  [facility] Long Term Goal Start Date  04/28/17 [Hemoglobin A1C decreased to 6.9%]  Interventions for Problem One Long Term Goal  Allowed member to express her feelings about her family situation and encouraged to continue to go to counseling, Instructed on how stress can effect her blood sugars, Reviewed CHO counting and portion control, Reveiwed  making wise choices  when eating fast foods, Encouraged to go to the gym regularly and reinforced the benefits of exercise for glycemic control, Discussed getting Freestyle Libre sensor and to discuss with her provider when she sees her in July,  Reinforced to check her bl6ood sugars more frequently as she is taking meal time insulin,  Reviewed blood sugar goals    Peter Garter RN, [facility] Care Management Coordinator-Link to [place]  Management [phone]

## 2017-05-08 MED FILL — LEVOCETIRIZINE 5 MG TABLET: 5 | 90 days supply | Qty: 90 | Fill #0

## 2017-05-08 MED FILL — FENOFIBRATE 134 MG CAPSULE: 134 | 90 days supply | Qty: 90 | Fill #0

## 2017-05-15 ENCOUNTER — Other Ambulatory Visit: Payer: Self-pay | Admitting: Podiatry

## 2017-05-15 NOTE — Telephone Encounter (Signed)
I am wanting a refill on the Flexeril 10 mg that Dr. Milinda Pointer prescribed for me the last time I was there for the spasms in my feet. I called my pharmacy to get it refilled and they said they wouldn't refill it since it is not a maintenance drug. Could I please get a refill today as I am out and my feet are hurting.

## 2017-05-16 MED FILL — CYCLOBENZAPRINE 10 MG TAB: 10 | 30 days supply | Qty: 90 | Fill #0

## 2017-05-16 NOTE — Telephone Encounter (Signed)
Dr Milinda Pointer, are you ok with refilling?

## 2017-05-30 DIAGNOSIS — E119 Type 2 diabetes mellitus without complications: Secondary | ICD-10-CM | POA: Diagnosis not present

## 2017-05-30 DIAGNOSIS — Z79899 Other long term (current) drug therapy: Secondary | ICD-10-CM | POA: Diagnosis not present

## 2017-05-30 DIAGNOSIS — M069 Rheumatoid arthritis, unspecified: Secondary | ICD-10-CM | POA: Diagnosis not present

## 2017-05-30 DIAGNOSIS — H40013 Open angle with borderline findings, low risk, bilateral: Secondary | ICD-10-CM | POA: Diagnosis not present

## 2017-06-05 DIAGNOSIS — E039 Hypothyroidism, unspecified: Secondary | ICD-10-CM | POA: Diagnosis not present

## 2017-06-05 DIAGNOSIS — E7800 Pure hypercholesterolemia, unspecified: Secondary | ICD-10-CM | POA: Diagnosis not present

## 2017-06-05 DIAGNOSIS — G609 Hereditary and idiopathic neuropathy, unspecified: Secondary | ICD-10-CM | POA: Diagnosis not present

## 2017-06-05 DIAGNOSIS — E1165 Type 2 diabetes mellitus with hyperglycemia: Secondary | ICD-10-CM | POA: Diagnosis not present

## 2017-06-05 DIAGNOSIS — E049 Nontoxic goiter, unspecified: Secondary | ICD-10-CM | POA: Diagnosis not present

## 2017-06-13 ENCOUNTER — Encounter: Payer: Self-pay | Admitting: Podiatry

## 2017-06-13 ENCOUNTER — Ambulatory Visit (INDEPENDENT_AMBULATORY_CARE_PROVIDER_SITE_OTHER): Payer: 59 | Admitting: Podiatry

## 2017-06-13 DIAGNOSIS — M7750 Other enthesopathy of unspecified foot: Secondary | ICD-10-CM | POA: Diagnosis not present

## 2017-06-14 NOTE — Progress Notes (Signed)
She presents today with a chief concern of tenderness along the medial longitudinal arch left and she still getting some odd symptoms of tightness and numbness across the dorsal aspect of the left foot. She states that her diabetes is under better control hemoglobin A1c is 6.0.  Objective: Vital signs stable she is alert and oriented 3 pulses remain palpable left foot. She has good full range of motion of all other joints distal to the ankle without crepitation. She has good full range of motion of all the tendons with muscle strength +5 over 5 dorsiflexion 5 flexors and inverters and everters. She does have some sensory loss but this is bilateral and is more than likely diabetic peripheral neuropathy she also has tenderness on palpation of the posterior tibial tendon with some bogginess just to the inferior aspect of the medial malleolus left. More than likely this is some mild tendinitis.  Assessment: Posterior tibial tendinitis diabetic peripheral neuropathy.  Plan: I injected dexamethasone into the tendon sheath. I will follow-up with her in 4-6 months. Should this not resolve she will notify us immediately.

## 2017-06-19 DIAGNOSIS — F4321 Adjustment disorder with depressed mood: Secondary | ICD-10-CM | POA: Diagnosis not present

## 2017-06-19 DIAGNOSIS — F419 Anxiety disorder, unspecified: Secondary | ICD-10-CM | POA: Diagnosis not present

## 2017-06-19 DIAGNOSIS — J3489 Other specified disorders of nose and nasal sinuses: Secondary | ICD-10-CM | POA: Diagnosis not present

## 2017-06-19 MED FILL — ALPRAZolam 1 MG TABS: 1 | 90 days supply | Qty: 270 | Fill #0

## 2017-06-19 MED FILL — MUPIROCIN 2% OINTMENT: 2 | 5 days supply | Qty: 22 | Fill #0

## 2017-06-23 IMAGING — MG 2D DIGITAL SCREENING BILATERAL MAMMOGRAM WITH CAD AND ADJUNCT TO
8 of 12 series · 8 of 28 positions shown · non-contrast
Comparison: Previous exam(s).

CLINICAL DATA: Screening.

EXAM:
2D DIGITAL SCREENING BILATERAL MAMMOGRAM WITH CAD AND ADJUNCT TOMO

[L MLO]
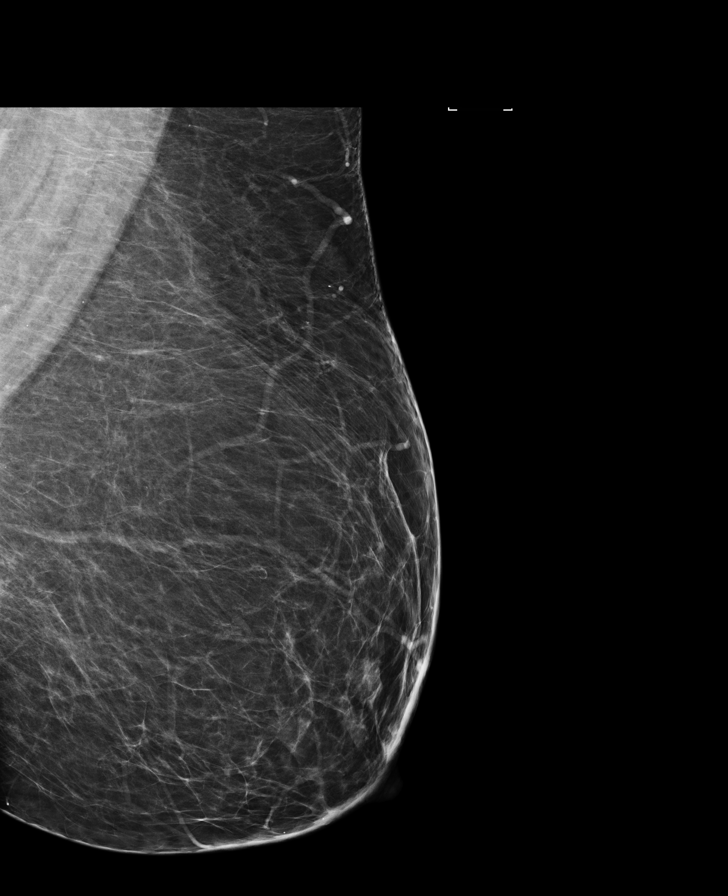

[L CC]
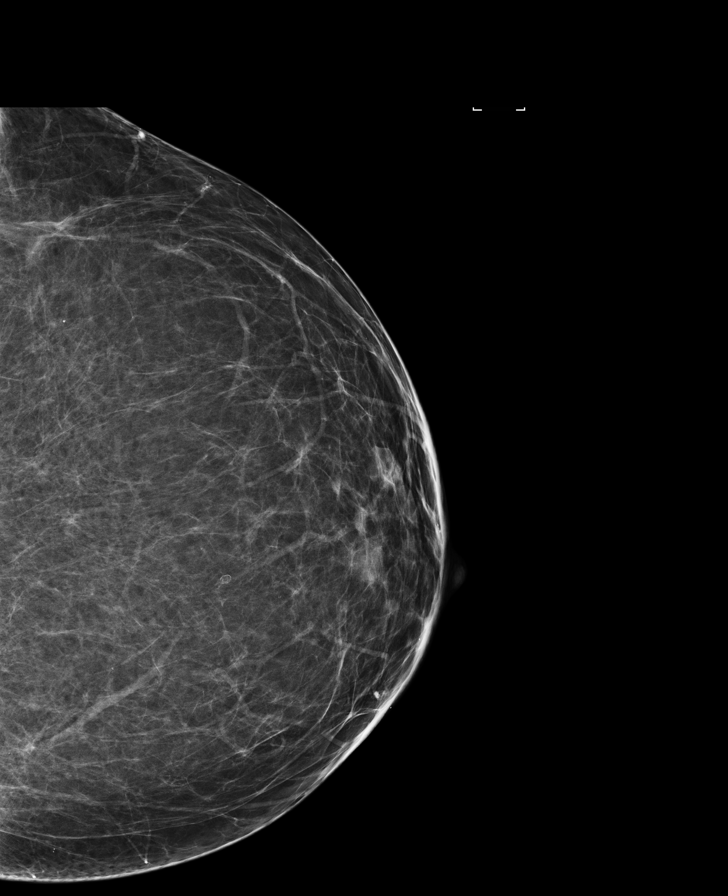

[R CC]
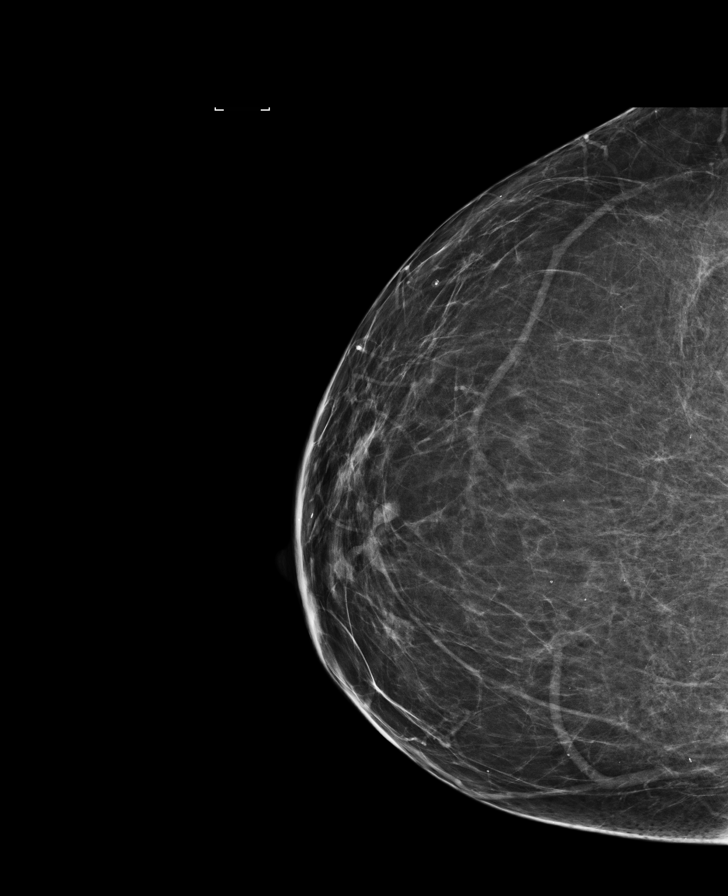

[R MLO synth-2D]
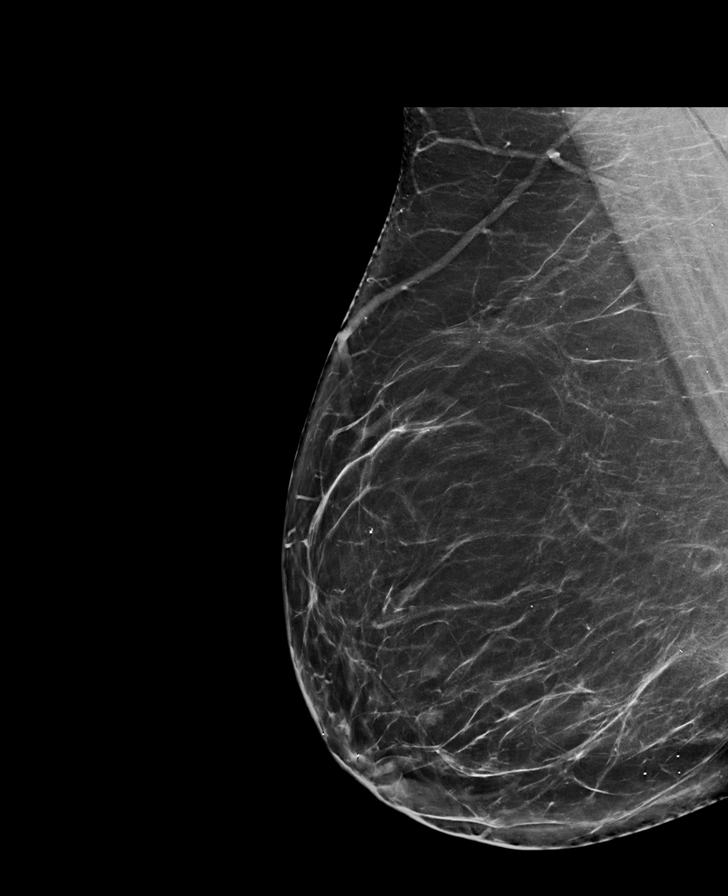

[L CC synth-2D]
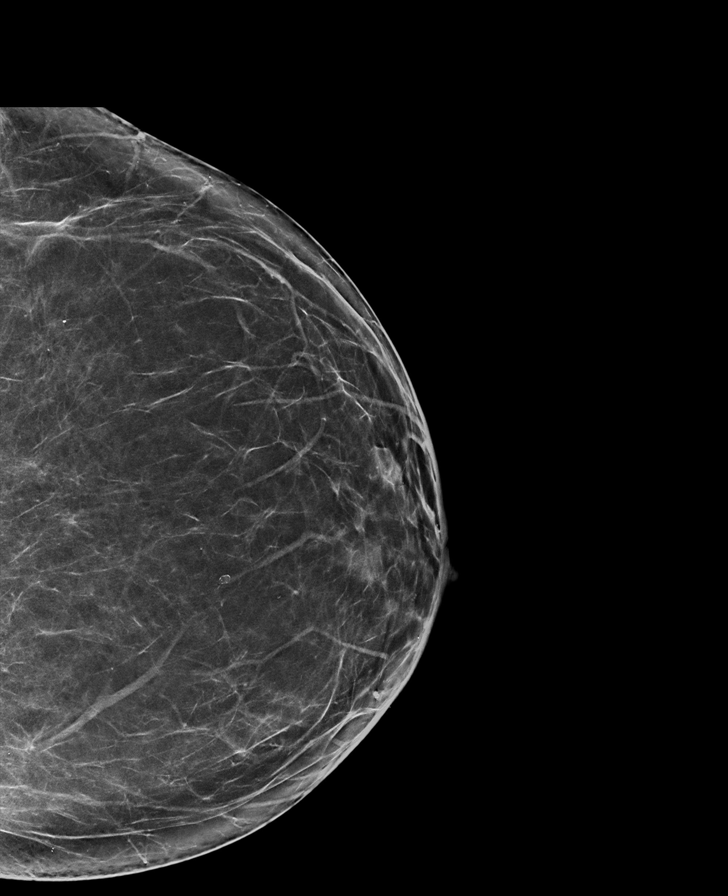

[L MLO synth-2D]
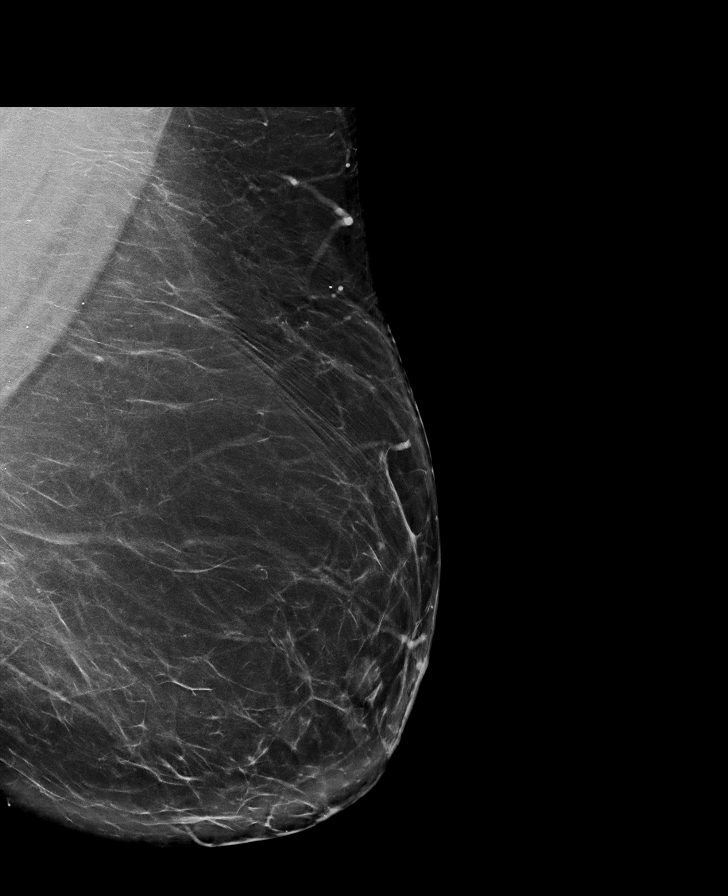

[R MLO]
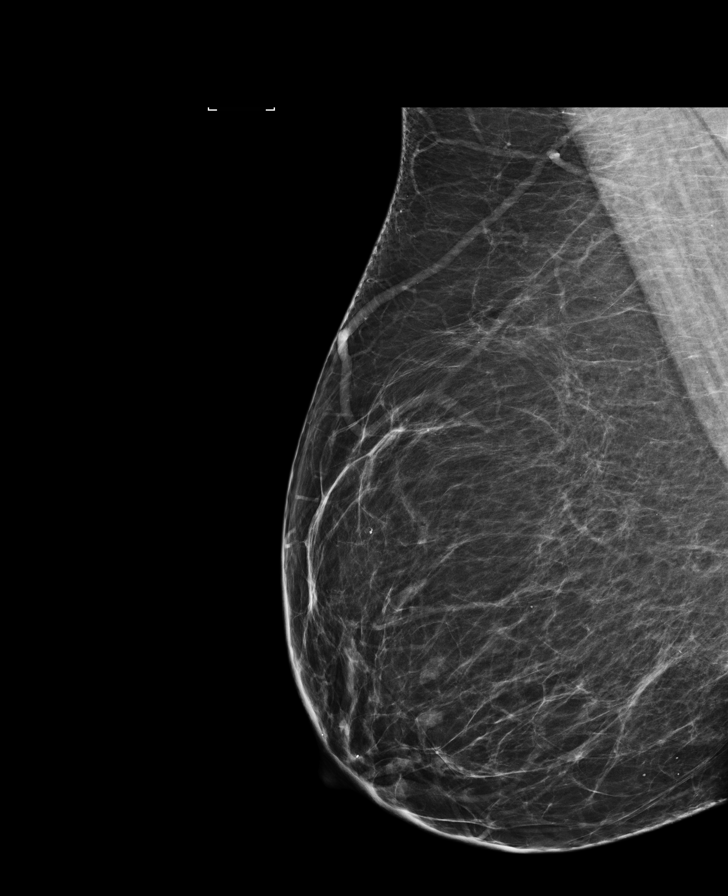

[R CC synth-2D]
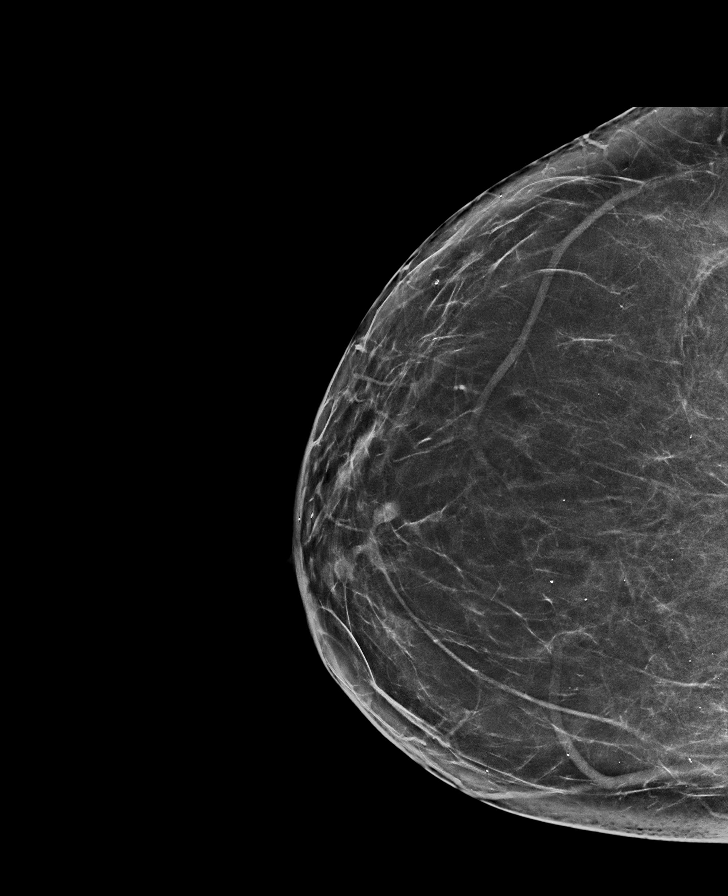

[8 of 28 positions shown; findings below may reference images not displayed]

ACR Breast Density Category b: There are scattered areas of
fibroglandular density.
FINDINGS: There are no findings suspicious for malignancy. Images were
processed with CAD.
IMPRESSION: No mammographic evidence of malignancy. A result letter of this
screening mammogram will be mailed directly to the patient.

RECOMMENDATION:
Screening mammogram in one year. (Code:97-6-RS4)

BI-RADS CATEGORY  1: Negative.

## 2017-07-18 MED FILL — LEVOTHYROXINE 50 MCG TABLET: 50 | 90 days supply | Qty: 90 | Fill #0

## 2017-07-26 DIAGNOSIS — M0589 Other rheumatoid arthritis with rheumatoid factor of multiple sites: Secondary | ICD-10-CM | POA: Diagnosis not present

## 2017-07-26 DIAGNOSIS — L659 Nonscarring hair loss, unspecified: Secondary | ICD-10-CM | POA: Diagnosis not present

## 2017-07-26 DIAGNOSIS — M79631 Pain in right forearm: Secondary | ICD-10-CM | POA: Diagnosis not present

## 2017-07-26 DIAGNOSIS — Z6834 Body mass index (BMI) 34.0-34.9, adult: Secondary | ICD-10-CM | POA: Diagnosis not present

## 2017-07-26 DIAGNOSIS — M150 Primary generalized (osteo)arthritis: Secondary | ICD-10-CM | POA: Diagnosis not present

## 2017-07-26 DIAGNOSIS — M2550 Pain in unspecified joint: Secondary | ICD-10-CM | POA: Diagnosis not present

## 2017-07-26 DIAGNOSIS — M5136 Other intervertebral disc degeneration, lumbar region: Secondary | ICD-10-CM | POA: Diagnosis not present

## 2017-07-26 DIAGNOSIS — Z79899 Other long term (current) drug therapy: Secondary | ICD-10-CM | POA: Diagnosis not present

## 2017-07-26 DIAGNOSIS — E669 Obesity, unspecified: Secondary | ICD-10-CM | POA: Diagnosis not present

## 2017-07-26 MED FILL — HYDROXYCHLOROQUINE 200 MG T: 200 | 90 days supply | Qty: 180 | Fill #0

## 2017-07-26 MED FILL — METHOTREXATE 25 MG/ML VIAL: 50 | 30 days supply | Qty: 4 | Fill #0

## 2017-07-28 ENCOUNTER — Ambulatory Visit: Payer: Self-pay

## 2017-07-31 MED FILL — HYDROCHLOROTHIAZIDE 25 MG T: 25 | 90 days supply | Qty: 90 | Fill #2

## 2017-07-31 MED FILL — GABAPENTIN 400 MG CAPSULE: 400 | 90 days supply | Qty: 270 | Fill #2

## 2017-08-01 MED FILL — OMEPRAZOLE DR 40 MG CAPSULE: 40 | 90 days supply | Qty: 180 | Fill #0

## 2017-08-01 MED FILL — LEVOCETIRIZINE 5 MG TABLET: 5 | 90 days supply | Qty: 90 | Fill #0

## 2017-08-01 MED FILL — ZOLPIDEM TART ER 12.5 MG TA: 12.5 | 90 days supply | Qty: 90 | Fill #0

## 2017-08-01 MED FILL — METFORMIN HCL ER 500 MG TAB: 500 | 90 days supply | Qty: 360 | Fill #0

## 2017-08-01 MED FILL — ESTRADIOL 0.5 MG TABLET: 0.5 | 90 days supply | Qty: 45 | Fill #0

## 2017-08-01 MED FILL — ATORVASTATIN 40 MG TABLET: 40 | 90 days supply | Qty: 90 | Fill #0

## 2017-08-01 MED FILL — MONTELUKAST SOD 10 MG TAB: 10 | 90 days supply | Qty: 90 | Fill #0

## 2017-08-01 MED FILL — traMADol HCL 50 MG TABS: 50 | 90 days supply | Qty: 180 | Fill #0

## 2017-08-01 MED FILL — DULoxetine HCL 60 MG CPEP: 60 | 90 days supply | Qty: 180 | Fill #0

## 2017-08-01 MED FILL — FENOFIBRATE 134 MG CAPSULE: 134 | 90 days supply | Qty: 90 | Fill #0

## 2017-08-01 MED FILL — AMLODIPINE BESYLATE 5 MG TA: 5 | 90 days supply | Qty: 90 | Fill #0

## 2017-08-01 MED FILL — FOLIC ACID 1 MG TABLET: 1 | 90 days supply | Qty: 180 | Fill #0

## 2017-08-02 MED FILL — LANTUS SOLOSTAR 100 UNITS/M: 100 | 28 days supply | Qty: 30 | Fill #0

## 2017-08-03 DIAGNOSIS — Z6833 Body mass index (BMI) 33.0-33.9, adult: Secondary | ICD-10-CM | POA: Diagnosis not present

## 2017-08-03 DIAGNOSIS — Z01419 Encounter for gynecological examination (general) (routine) without abnormal findings: Secondary | ICD-10-CM | POA: Diagnosis not present

## 2017-08-03 DIAGNOSIS — Z1231 Encounter for screening mammogram for malignant neoplasm of breast: Secondary | ICD-10-CM | POA: Diagnosis not present

## 2017-08-03 MED FILL — NYSTATIN-TRIAMCINOLONE OINT: 100000-0.1 | 30 days supply | Qty: 30 | Fill #0

## 2017-08-07 DIAGNOSIS — E1142 Type 2 diabetes mellitus with diabetic polyneuropathy: Secondary | ICD-10-CM | POA: Diagnosis not present

## 2017-08-07 DIAGNOSIS — F324 Major depressive disorder, single episode, in partial remission: Secondary | ICD-10-CM | POA: Diagnosis not present

## 2017-08-07 DIAGNOSIS — G4700 Insomnia, unspecified: Secondary | ICD-10-CM | POA: Diagnosis not present

## 2017-08-07 DIAGNOSIS — E039 Hypothyroidism, unspecified: Secondary | ICD-10-CM | POA: Diagnosis not present

## 2017-08-07 DIAGNOSIS — M069 Rheumatoid arthritis, unspecified: Secondary | ICD-10-CM | POA: Diagnosis not present

## 2017-08-07 DIAGNOSIS — K219 Gastro-esophageal reflux disease without esophagitis: Secondary | ICD-10-CM | POA: Diagnosis not present

## 2017-08-07 DIAGNOSIS — F419 Anxiety disorder, unspecified: Secondary | ICD-10-CM | POA: Diagnosis not present

## 2017-08-07 DIAGNOSIS — I10 Essential (primary) hypertension: Secondary | ICD-10-CM | POA: Diagnosis not present

## 2017-08-07 DIAGNOSIS — E782 Mixed hyperlipidemia: Secondary | ICD-10-CM | POA: Diagnosis not present

## 2017-08-11 ENCOUNTER — Ambulatory Visit: Payer: Self-pay

## 2017-08-11 MED FILL — CYCLOBENZAPRINE 10 MG TAB: 10 | 30 days supply | Qty: 90 | Fill #1

## 2017-09-13 DIAGNOSIS — E039 Hypothyroidism, unspecified: Secondary | ICD-10-CM | POA: Diagnosis not present

## 2017-09-22 ENCOUNTER — Other Ambulatory Visit: Payer: Self-pay

## 2017-09-22 NOTE — Patient Outreach (Signed)
Amy Adams) Care Management  09/22/2017  Amy Adams 17-Dec-1965 [id number]   Telephone call to inform of closing of Link to Wellness case and transition to [facility].  Instructed that she will be transitioned to [facility] in 2019 for disease management and the Link to Wellness program will be closed. Instructed that she will be contacted by [employer] [facility] [employer] by phone in January 2019.   Instructed that she will continue to receive the pharmacy benefit.   Voiced understanding. [facility] will contact member in January to continue diabetes disease management. Case closed for Link to Wellness as member will be enrolled in an external program. Member and provider to be sent letter on transition to [place] RN, [facility] Care Management Coordinator-Link to [place] Management [phone]

## 2017-09-29 ENCOUNTER — Ambulatory Visit: Payer: Self-pay

## 2017-10-12 ENCOUNTER — Ambulatory Visit: Payer: 59 | Admitting: Podiatry

## 2017-10-12 ENCOUNTER — Encounter: Payer: Self-pay | Admitting: Podiatry

## 2017-10-12 DIAGNOSIS — M76822 Posterior tibial tendinitis, left leg: Secondary | ICD-10-CM | POA: Diagnosis not present

## 2017-10-12 DIAGNOSIS — E1142 Type 2 diabetes mellitus with diabetic polyneuropathy: Secondary | ICD-10-CM

## 2017-10-12 DIAGNOSIS — M722 Plantar fascial fibromatosis: Secondary | ICD-10-CM | POA: Diagnosis not present

## 2017-10-12 NOTE — Progress Notes (Signed)
She presents today chief concern of cramping to her arch and her third toe. States that her diabetes is under better control states that her husband has just passed away in 02-Aug-2023 that she's been stressed she continues to work for the hospital.  Objective: Vital signs are stable she's alert and oriented 3 second digit of left foot and amputated third toe appears to be normal no skin breakdown she has some tenderness on palpation of the third metatarsophalangeal joint though it is not edematous neurologic sensorium is diminished good strong pulses. History of amputation.  Assessment: Diabetes mellitus diabetic peripheral neuropathy history of amputation  Plan: I will get her into a new set of shoes and orthotics.

## 2017-10-25 DIAGNOSIS — M150 Primary generalized (osteo)arthritis: Secondary | ICD-10-CM | POA: Diagnosis not present

## 2017-10-25 DIAGNOSIS — M5136 Other intervertebral disc degeneration, lumbar region: Secondary | ICD-10-CM | POA: Diagnosis not present

## 2017-10-25 DIAGNOSIS — Z79899 Other long term (current) drug therapy: Secondary | ICD-10-CM | POA: Diagnosis not present

## 2017-10-25 DIAGNOSIS — M0589 Other rheumatoid arthritis with rheumatoid factor of multiple sites: Secondary | ICD-10-CM | POA: Diagnosis not present

## 2017-10-25 DIAGNOSIS — M25512 Pain in left shoulder: Secondary | ICD-10-CM | POA: Diagnosis not present

## 2017-10-25 DIAGNOSIS — E669 Obesity, unspecified: Secondary | ICD-10-CM | POA: Diagnosis not present

## 2017-10-25 DIAGNOSIS — Z6834 Body mass index (BMI) 34.0-34.9, adult: Secondary | ICD-10-CM | POA: Diagnosis not present

## 2017-10-25 DIAGNOSIS — L659 Nonscarring hair loss, unspecified: Secondary | ICD-10-CM | POA: Diagnosis not present

## 2017-10-25 DIAGNOSIS — M2550 Pain in unspecified joint: Secondary | ICD-10-CM | POA: Diagnosis not present

## 2017-10-25 MED FILL — METHOTREXATE 25 MG/ML VIAL: 50 | 28 days supply | Qty: 4 | Fill #0

## 2017-10-26 ENCOUNTER — Other Ambulatory Visit: Payer: Self-pay | Admitting: Podiatry

## 2017-10-26 MED FILL — LEVOTHYROXINE 50 MCG TABLET: 50 | 90 days supply | Qty: 90 | Fill #1

## 2017-10-26 MED FILL — HYDROCHLOROTHIAZIDE 25 MG T: 25 | 90 days supply | Qty: 90 | Fill #3

## 2017-10-26 MED FILL — FOLIC ACID 1 MG TABLET: 1 | 90 days supply | Qty: 180 | Fill #1

## 2017-10-26 MED FILL — ESTRADIOL 0.5 MG TABLET: 0.5 | 90 days supply | Qty: 45 | Fill #0

## 2017-10-26 MED FILL — ALPRAZolam 1 MG TABS: 1 | 90 days supply | Qty: 270 | Fill #0

## 2017-10-26 MED FILL — HUMALOG 100 UNITS/ML KWIKPE: 100 | 90 days supply | Qty: 90 | Fill #0

## 2017-10-26 MED FILL — DULoxetine HCL 60 MG CPEP: 60 | 90 days supply | Qty: 180 | Fill #0

## 2017-10-26 MED FILL — GABAPENTIN 400 MG CAPSULE: 400 | 90 days supply | Qty: 270 | Fill #3

## 2017-10-26 MED FILL — LEVOCETIRIZINE 5 MG TABLET: 5 | 90 days supply | Qty: 90 | Fill #0

## 2017-10-26 MED FILL — METFORMIN HCL ER 500 MG TAB: 500 | 90 days supply | Qty: 360 | Fill #1

## 2017-10-26 MED FILL — MONTELUKAST SOD 10 MG TAB: 10 | 90 days supply | Qty: 90 | Fill #0

## 2017-10-27 NOTE — Telephone Encounter (Signed)
Electronic pharmacy request for cyclobenzaprine, routed to Dr. Milinda Pointer.

## 2017-10-30 MED FILL — traMADol HCL 50 MG TABS: 50 | 90 days supply | Qty: 180 | Fill #0

## 2017-10-30 MED FILL — CYCLOBENZAPRINE 10 MG TAB: 10 | 30 days supply | Qty: 90 | Fill #0

## 2017-10-31 MED FILL — LANTUS SOLOSTAR 100 UNITS/M: 100 | 28 days supply | Qty: 30 | Fill #1

## 2017-11-02 ENCOUNTER — Encounter: Payer: 59 | Admitting: Orthotics

## 2017-11-02 ENCOUNTER — Ambulatory Visit: Payer: 59 | Admitting: Orthotics

## 2017-11-02 DIAGNOSIS — M7750 Other enthesopathy of unspecified foot: Secondary | ICD-10-CM

## 2017-11-02 NOTE — Progress Notes (Signed)
Patient came in today to pick up custom made foot orthotics.  The goals were accomplished and the patient reported no dissatisfaction with said orthotics.  Patient was advised of breakin period and how to report any issues. 

## 2017-11-28 DIAGNOSIS — Z79899 Other long term (current) drug therapy: Secondary | ICD-10-CM | POA: Diagnosis not present

## 2017-11-28 DIAGNOSIS — M069 Rheumatoid arthritis, unspecified: Secondary | ICD-10-CM | POA: Diagnosis not present

## 2017-11-28 DIAGNOSIS — H532 Diplopia: Secondary | ICD-10-CM | POA: Diagnosis not present

## 2017-12-06 DIAGNOSIS — G609 Hereditary and idiopathic neuropathy, unspecified: Secondary | ICD-10-CM | POA: Diagnosis not present

## 2017-12-06 DIAGNOSIS — E039 Hypothyroidism, unspecified: Secondary | ICD-10-CM | POA: Diagnosis not present

## 2017-12-06 DIAGNOSIS — E1165 Type 2 diabetes mellitus with hyperglycemia: Secondary | ICD-10-CM | POA: Diagnosis not present

## 2017-12-06 DIAGNOSIS — I10 Essential (primary) hypertension: Secondary | ICD-10-CM | POA: Diagnosis not present

## 2017-12-06 DIAGNOSIS — E049 Nontoxic goiter, unspecified: Secondary | ICD-10-CM | POA: Diagnosis not present

## 2017-12-06 DIAGNOSIS — E7800 Pure hypercholesterolemia, unspecified: Secondary | ICD-10-CM | POA: Diagnosis not present

## 2017-12-06 DIAGNOSIS — R5383 Other fatigue: Secondary | ICD-10-CM | POA: Diagnosis not present

## 2017-12-06 MED FILL — LANTUS SOLOSTAR 100 UNITS/M: 100 | 84 days supply | Qty: 105 | Fill #0

## 2017-12-14 MED FILL — FREESTYLE LIBRE 14 DAY SENS: 84 days supply | Qty: 6 | Fill #0

## 2017-12-14 MED FILL — FREESTYLE LIBRE 14 DAY READ: 30 days supply | Qty: 1 | Fill #0

## 2017-12-18 MED FILL — NITROFURANTOIN MONO-MCR 100: 100 | 7 days supply | Qty: 14 | Fill #0

## 2017-12-21 ENCOUNTER — Ambulatory Visit: Payer: 59 | Admitting: Podiatry

## 2017-12-21 ENCOUNTER — Encounter: Payer: Self-pay | Admitting: Podiatry

## 2017-12-21 ENCOUNTER — Ambulatory Visit (INDEPENDENT_AMBULATORY_CARE_PROVIDER_SITE_OTHER): Payer: 59

## 2017-12-21 DIAGNOSIS — M7750 Other enthesopathy of unspecified foot: Secondary | ICD-10-CM

## 2017-12-21 DIAGNOSIS — M7672 Peroneal tendinitis, left leg: Secondary | ICD-10-CM | POA: Diagnosis not present

## 2017-12-24 NOTE — Progress Notes (Signed)
She presents today for follow-up of her posterior tibial tendinitis.  She states that is real tight right in here she points to the posterior tibial tendon and it was real swollen just the other day.  States that she continues to work out and try to lose weight to help with her hemoglobin A1c.  She denies any trauma to the foot.  Objective: Vital signs are stable alert and oriented x3.  Pulses are palpable.  Neurologic sensorium is unchanged.  She has pain on palpation with fluctuance in the distalmost aspect of the posterior tibial tendon where it was repaired.  She also has pain and fluctuance on palpation of the plantar fascia  Assessment: Posterior tibial tendinitis right.  Plantar fasciitis  Plan: Injected the area of fluctuance today with 20 mg of Kenalog 5 mg Marcaine at the point of maximal tenderness of sterile Betadine skin prep and verbal consent.  Follow-up with her in 1 month.  Discussed use of a Tri-Lock brace which she has at home and Ace wrap.  She understands this and is amenable to it.  Injected plantar fascia as well same medication was utilized.

## 2017-12-27 ENCOUNTER — Encounter: Payer: 59 | Attending: Family Medicine | Admitting: *Deleted

## 2017-12-27 DIAGNOSIS — Z713 Dietary counseling and surveillance: Secondary | ICD-10-CM | POA: Insufficient documentation

## 2017-12-27 DIAGNOSIS — E119 Type 2 diabetes mellitus without complications: Secondary | ICD-10-CM

## 2017-12-27 MED FILL — FREESTYLE LIBRE 14 DAY SENS: 13 days supply | Qty: 1 | Fill #1

## 2017-12-29 NOTE — Progress Notes (Signed)
Diabetes Self-Management Education  Visit Type:  Follow-up  Appt. Start Time: 1045 Appt. End Time: 1105  12/29/2017  Amy Adams, identified by name and date of birth, is a 52 y.o. female with a diagnosis of Diabetes:  .  Patient continues to have ongoing weight loss as she grieves for the sudden loss of her partner. Weight loss of almost 35 pounds noted. She is going to the gym as a stress reliever too. Her insulin requirements have dropped, see notes below. A1c is at 6.6% ASSESSMENT  Height 5' 9.5" (1.765 m), weight 225 lb 6.4 oz (102.2 kg). Body mass index is 32.81 kg/m.   Diabetes Self-Management Education - 12/29/17 1335      Psychosocial Assessment   Patient Belief/Attitude about Diabetes  Motivated to manage diabetes    Self-care barriers  Other (comment) grieving loss of her partner about 6 months ago    Self-management support  CDE visits;Friends    Patient Concerns  Nutrition/Meal planning;Medication;Glycemic Control    Special Needs  None    Learning Readiness  Change in progress      Complications   Last HgB A1C per patient/outside source  6.6 %    How often do you check your blood sugar?  > 4 times/day    Number of hypoglycemic episodes per month  30      Exercise   Exercise Type  Light (walking / raking leaves)    How many days per week to you exercise?  3    How many minutes per day do you exercise?  30    Total minutes per week of exercise  90      Patient Education   Previous Diabetes Education  Yes (please comment)    Medications  Other (comment) reviewed criteria for meal and correction doses of insulin       Individualized Goals (developed by patient)   Nutrition  General guidelines for healthy choices and portions discussed    Physical Activity  Exercise 3-5 times per week    Medications  take my medication as prescribed    Monitoring   test blood glucose pre and post meals as discussed      Patient Self-Evaluation of Goals - Patient rates self  as meeting previously set goals (% of time)   Nutrition  50 - 75 %    Physical Activity  >75%    Medications  >75%    Monitoring  >75%    Problem Solving  50 - 75 %    Reducing Risk  50 - 75 %    Health Coping  25 - 50%      Outcomes   Program Status  Completed      Subsequent Visit   Since your last visit have you continued or begun to take your medications as prescribed?  No taking them but not as prescribed. Taking less insulin due to hypoglycemia    Since your last visit have you experienced any weight changes?  Loss    Weight Loss (lbs)  35    Since your last visit, are you checking your blood glucose at least once a day?  Yes She now has Libre CGM      Learning Objective:  Patient will have a greater understanding of diabetes self-management. Patient education plan is to attend individual and/or group sessions per assessed needs and concerns.  Plan:  Patient Instructions  Plan:   Aim for 2-3 Carb Choices per meal (30-45 grams)  Aim for 0-2 Carbs per snack if hungry   Include protein in moderation with your meals and snacks  Continue reading food labels for Total Carbohydrate of foods  Continue with your activity level by going to the gym for 30 minutes several days a week as tolerated  Continue checking BG at alternate times per day using Libre CGM   Continue taking medication as directed by MD  We discussed meal insulin vs correction insulin. Continue to use 1 unit/5 grams carbohydrate (which also = 3 units/carb choice) To correct higher BG's, use 1 unit for every 50 points you are above 125 mg/dl.  Expected Outcomes:  Demonstrated interest in learning. Expect positive outcomes  Education material provided: No new handouts today  If problems or questions, patient to contact team via:  Phone  Future DSME appointment: - PRN

## 2017-12-29 NOTE — Patient Instructions (Signed)
Plan:   Aim for 2-3 Carb Choices per meal (30-45 grams)    Aim for 0-2 Carbs per snack if hungry   Include protein in moderation with your meals and snacks  Continue reading food labels for Total Carbohydrate of foods  Continue with your activity level by going to the gym for 30 minutes several days a week as tolerated  Continue checking BG at alternate times per day using Libre CGM   Continue taking medication as directed by MD  We discussed meal insulin vs correction insulin. Continue to use 1 unit/5 grams carbohydrate (which also = 3 units/carb choice) To correct higher BG's, use 1 unit for every 50 points you are above 125 mg/dl.

## 2018-01-02 MED FILL — HYDROXYCHLOROQUINE 200 MG: 200 | 90 days supply | Qty: 180 | Fill #1

## 2018-01-02 MED FILL — ZOLPIDEM TART ER 12.5 MG TA: 12.5 | 90 days supply | Qty: 90 | Fill #1

## 2018-01-04 MED FILL — CYCLOBENZAPRINE 10 MG TAB: 10 | 30 days supply | Qty: 90 | Fill #1

## 2018-01-10 ENCOUNTER — Encounter: Payer: 59 | Admitting: *Deleted

## 2018-01-10 DIAGNOSIS — Z713 Dietary counseling and surveillance: Secondary | ICD-10-CM | POA: Diagnosis not present

## 2018-01-10 DIAGNOSIS — E119 Type 2 diabetes mellitus without complications: Secondary | ICD-10-CM

## 2018-01-11 NOTE — Progress Notes (Signed)
Diabetes Self-Management Education Follow Up visit  Visit Type:     Appt. Start Time: 0945 Appt. End Time: 1950  01/11/2018  [name], identified by name and date of birth, is a 52 y.o. female with a diagnosis of Diabetes:  .  Patient continues to have ongoing weight loss as she grieves for the sudden loss of her partner. Weight today is 223.4 lbs.  She is going to the gym as a stress reliever too. A1c is at 6.6% ASSESSMENT  Height 5' 9.5" (1.765 m), weight 223 lb 6.4 oz (101.3 kg). Body mass index is 32.52 kg/m.   Learning Objective:  Patient will have a greater understanding of diabetes self-management. Patient education plan is to attend individual and/or group sessions per assessed needs and concerns.  Plan:  Patient Instructions  Plan:  We discussed the trouble you are having to get balanced meal at home as you are not cooking anymore since Amy Adams passed away.   Consider trying [employer] powder that can be mixed with milk. This will provide the nutrition of a meal that you can drink. We reviewed the Nutrition Label and it will count as a couple of Carb Choices for that meal.   Continue going to the gym as tolerated but bring a carb snack with you in case your BG drops.   Expected Outcomes:     Education material provided: Grief analogy drawing of grief ball inside box of life and pain button on the side.  If problems or questions, patient to contact team via:  Phone  Future DSME appointment: -  PRN

## 2018-01-11 NOTE — Patient Instructions (Signed)
Plan:  We discussed the trouble you are having to get balanced meal at home as you are not cooking anymore since Amy Adams passed away.   Consider trying [employer] powder that can be mixed with milk. This will provide the nutrition of a meal that you can drink. We reviewed the Nutrition Label and it will count as a couple of Carb Choices for that meal.   Continue going to the gym as tolerated but bring a carb snack with you in case your BG drops.

## 2018-01-19 MED FILL — PROAIR HFA 90 MCG INHALER: 108 (90 BAS | 75 days supply | Qty: 26 | Fill #0

## 2018-01-23 DIAGNOSIS — M25512 Pain in left shoulder: Secondary | ICD-10-CM | POA: Diagnosis not present

## 2018-01-23 DIAGNOSIS — Z6832 Body mass index (BMI) 32.0-32.9, adult: Secondary | ICD-10-CM | POA: Diagnosis not present

## 2018-01-23 DIAGNOSIS — M2550 Pain in unspecified joint: Secondary | ICD-10-CM | POA: Diagnosis not present

## 2018-01-23 DIAGNOSIS — L659 Nonscarring hair loss, unspecified: Secondary | ICD-10-CM | POA: Diagnosis not present

## 2018-01-23 DIAGNOSIS — M0589 Other rheumatoid arthritis with rheumatoid factor of multiple sites: Secondary | ICD-10-CM | POA: Diagnosis not present

## 2018-01-23 DIAGNOSIS — Z79899 Other long term (current) drug therapy: Secondary | ICD-10-CM | POA: Diagnosis not present

## 2018-01-23 DIAGNOSIS — M5136 Other intervertebral disc degeneration, lumbar region: Secondary | ICD-10-CM | POA: Diagnosis not present

## 2018-01-23 DIAGNOSIS — E669 Obesity, unspecified: Secondary | ICD-10-CM | POA: Diagnosis not present

## 2018-01-23 DIAGNOSIS — M150 Primary generalized (osteo)arthritis: Secondary | ICD-10-CM | POA: Diagnosis not present

## 2018-01-23 MED FILL — METHOTREXATE 25 MG/ML VIAL: 50 | 84 days supply | Qty: 12 | Fill #1

## 2018-01-23 MED FILL — BD TB SYRINGE 27GX1/2: 27G X 1/2" | 84 days supply | Qty: 12 | Fill #0

## 2018-01-23 MED FILL — BD TB SYRINGE 27GX1/2": 27G X 1/2" | 84 days supply | Qty: 12 | Fill #0

## 2018-01-26 DIAGNOSIS — M7582 Other shoulder lesions, left shoulder: Secondary | ICD-10-CM | POA: Diagnosis not present

## 2018-01-30 DIAGNOSIS — N390 Urinary tract infection, site not specified: Secondary | ICD-10-CM | POA: Diagnosis not present

## 2018-01-31 ENCOUNTER — Other Ambulatory Visit: Payer: Self-pay | Admitting: Podiatry

## 2018-01-31 MED FILL — NITROFURANTOIN MONO-MCR 100: 100 | 5 days supply | Qty: 10 | Fill #0

## 2018-01-31 MED FILL — ALPRAZolam 1 MG TABS: 1 | 90 days supply | Qty: 270 | Fill #0

## 2018-01-31 MED FILL — FLUCONAZOLE 150 MG TABLET: 150 | 8 days supply | Qty: 2 | Fill #0

## 2018-01-31 MED FILL — ESTRADIOL 0.5 MG TABLET: 0.5 | 90 days supply | Qty: 45 | Fill #1

## 2018-01-31 MED FILL — NYSTATIN-TRIAMCINOLONE OINT: 100000-0.1 | 30 days supply | Qty: 30 | Fill #1

## 2018-01-31 MED FILL — HUMALOG 100 UNITS/ML KWIKPE: 100 | 45 days supply | Qty: 45 | Fill #1

## 2018-01-31 MED FILL — LEVOTHYROXINE 50 MCG TABLET: 50 | 90 days supply | Qty: 90 | Fill #2

## 2018-01-31 MED FILL — LEVOCETIRIZINE 5 MG TABLET: 5 | 90 days supply | Qty: 90 | Fill #0

## 2018-01-31 MED FILL — DULoxetine HCL 60 MG CPEP: 60 | 90 days supply | Qty: 180 | Fill #0

## 2018-01-31 MED FILL — HYDROCHLOROTHIAZIDE 25 MG T: 25 | 90 days supply | Qty: 90 | Fill #0

## 2018-01-31 MED FILL — MUPIROCIN 2% OINTMENT: 2 | 15 days supply | Qty: 22 | Fill #0

## 2018-01-31 MED FILL — MONTELUKAST SOD 10 MG TAB: 10 | 90 days supply | Qty: 90 | Fill #0

## 2018-01-31 MED FILL — FOLIC ACID 1 MG TABLET: 1 | 90 days supply | Qty: 180 | Fill #2

## 2018-01-31 MED FILL — traMADol HCL 50 MG TABS: 50 | 90 days supply | Qty: 180 | Fill #0

## 2018-01-31 MED FILL — GABAPENTIN 400 MG CAPSULE: 400 | 90 days supply | Qty: 270 | Fill #0

## 2018-02-01 MED FILL — OMEPRAZOLE DR 40 MG CAPSULE: 40 | 90 days supply | Qty: 180 | Fill #0

## 2018-02-02 DIAGNOSIS — M25512 Pain in left shoulder: Secondary | ICD-10-CM | POA: Diagnosis not present

## 2018-02-02 MED FILL — FREESTYLE LIBRE 14 DAY SENS: 84 days supply | Qty: 6 | Fill #2

## 2018-02-05 DIAGNOSIS — M7582 Other shoulder lesions, left shoulder: Secondary | ICD-10-CM | POA: Diagnosis not present

## 2018-02-06 MED FILL — CEPHALEXIN 500 MG CAPSULE: 500 | 7 days supply | Qty: 14 | Fill #0

## 2018-02-08 ENCOUNTER — Ambulatory Visit: Payer: 59 | Admitting: Podiatry

## 2018-02-08 DIAGNOSIS — N3000 Acute cystitis without hematuria: Secondary | ICD-10-CM | POA: Diagnosis not present

## 2018-02-08 DIAGNOSIS — E782 Mixed hyperlipidemia: Secondary | ICD-10-CM | POA: Diagnosis not present

## 2018-02-08 DIAGNOSIS — F419 Anxiety disorder, unspecified: Secondary | ICD-10-CM | POA: Diagnosis not present

## 2018-02-08 DIAGNOSIS — I10 Essential (primary) hypertension: Secondary | ICD-10-CM | POA: Diagnosis not present

## 2018-02-08 DIAGNOSIS — E1142 Type 2 diabetes mellitus with diabetic polyneuropathy: Secondary | ICD-10-CM | POA: Diagnosis not present

## 2018-02-08 DIAGNOSIS — K219 Gastro-esophageal reflux disease without esophagitis: Secondary | ICD-10-CM | POA: Diagnosis not present

## 2018-02-08 DIAGNOSIS — M069 Rheumatoid arthritis, unspecified: Secondary | ICD-10-CM | POA: Diagnosis not present

## 2018-02-08 DIAGNOSIS — F324 Major depressive disorder, single episode, in partial remission: Secondary | ICD-10-CM | POA: Diagnosis not present

## 2018-02-08 DIAGNOSIS — E039 Hypothyroidism, unspecified: Secondary | ICD-10-CM | POA: Diagnosis not present

## 2018-03-09 DIAGNOSIS — R8290 Unspecified abnormal findings in urine: Secondary | ICD-10-CM | POA: Diagnosis not present

## 2018-03-09 DIAGNOSIS — N390 Urinary tract infection, site not specified: Secondary | ICD-10-CM | POA: Diagnosis not present

## 2018-03-09 MED FILL — CIPROFLOXACIN HCL 500 MG TA: 500 | 5 days supply | Qty: 10 | Fill #0

## 2018-03-09 MED FILL — FLUCONAZOLE 150 MG TABS: 150 | 2 days supply | Qty: 2 | Fill #0

## 2018-03-12 MED FILL — FREESTYLE LIBRE 14 DAY SENS: 14 days supply | Qty: 1 | Fill #3

## 2018-03-15 ENCOUNTER — Ambulatory Visit: Payer: 59 | Admitting: Podiatry

## 2018-03-22 MED FILL — CIPROFLOXACIN HCL 500 MG TA: 500 | 7 days supply | Qty: 14 | Fill #0

## 2018-03-22 MED FILL — FREESTYLE LIBRE 14 DAY SENS: 14 days supply | Qty: 1 | Fill #4

## 2018-03-22 MED FILL — FLUCONAZOLE 150 MG TABS: 150 | 7 days supply | Qty: 2 | Fill #0

## 2018-03-23 DIAGNOSIS — M793 Panniculitis, unspecified: Secondary | ICD-10-CM | POA: Diagnosis not present

## 2018-03-23 MED FILL — FREESTYLE LIBRE 14 DAY SENS: 14 days supply | Qty: 1 | Fill #5

## 2018-04-05 ENCOUNTER — Encounter: Payer: Self-pay | Admitting: Podiatry

## 2018-04-05 ENCOUNTER — Ambulatory Visit: Payer: 59 | Admitting: Podiatry

## 2018-04-05 DIAGNOSIS — M7750 Other enthesopathy of unspecified foot: Secondary | ICD-10-CM

## 2018-04-05 DIAGNOSIS — M779 Enthesopathy, unspecified: Secondary | ICD-10-CM | POA: Diagnosis not present

## 2018-04-05 DIAGNOSIS — G5752 Tarsal tunnel syndrome, left lower limb: Secondary | ICD-10-CM | POA: Diagnosis not present

## 2018-04-05 NOTE — Progress Notes (Signed)
She presents today after having lost several pounds looking much better and feeling much better.  She states that her left foot is still hurting and cramping right here she points to the medial aspect of the surgical site along the posterior tibial tendon repair.  Objective: Pulses are strongly palpable.  Posterior tibial tendon is intact and strong.  She has tenderness along the posterior tibial nerve in that area and when percussed does demonstrate some Tinel type symptoms.  Assessment: Cannot rule out tarsal tunnel capsulitis or even tendinitis.  Plan: I injected 20 mg Kenalog 5 mg Marcaine to the point of maximal tenderness of the left ankle.  We will follow-up with her in 4 to 6 weeks.

## 2018-04-10 ENCOUNTER — Encounter: Payer: 59 | Attending: Endocrinology | Admitting: *Deleted

## 2018-04-11 NOTE — Progress Notes (Signed)
Patient is having difficulty with adhesive of [place] staying on her skin. We discussed options including the use of antiperspirant, Grif Grips or Rock Tape.

## 2018-04-19 ENCOUNTER — Other Ambulatory Visit: Payer: Self-pay | Admitting: Podiatry

## 2018-04-19 MED FILL — MONTELUKAST SOD 10 MG TAB: 10 | 90 days supply | Qty: 90 | Fill #0

## 2018-04-19 MED FILL — METHOTREXATE 25 MG/ML VIAL: 50 | 30 days supply | Qty: 4 | Fill #1

## 2018-04-19 MED FILL — HYDROCHLOROTHIAZIDE 25 MG T: 25 | 90 days supply | Qty: 90 | Fill #1

## 2018-04-19 MED FILL — LEVOTHYROXINE 50 MCG TABLET: 50 | 90 days supply | Qty: 90 | Fill #3

## 2018-04-19 MED FILL — OMEPRAZOLE DR 40 MG CAPSULE: 40 | 90 days supply | Qty: 180 | Fill #0

## 2018-04-19 MED FILL — CYCLOBENZAPRINE 10 MG TAB: 10 | 30 days supply | Qty: 90 | Fill #0

## 2018-04-19 MED FILL — METFORMIN HCL ER 500 MG TAB: 500 | 90 days supply | Qty: 360 | Fill #0

## 2018-04-19 MED FILL — LEVOCETIRIZINE 5 MG TABLET: 5 | 90 days supply | Qty: 90 | Fill #0

## 2018-04-19 MED FILL — DULoxetine HCL 60 MG CPEP: 60 | 90 days supply | Qty: 180 | Fill #0

## 2018-04-19 MED FILL — ZOLPIDEM TART ER 12.5 MG TA: 12.5 | 90 days supply | Qty: 90 | Fill #0

## 2018-04-19 MED FILL — FOLIC ACID 1 MG TABS: 1 | 90 days supply | Qty: 180 | Fill #3

## 2018-04-19 MED FILL — ESTRADIOL 0.5 MG TABLET: 0.5 | 90 days supply | Qty: 45 | Fill #2

## 2018-04-20 DIAGNOSIS — H524 Presbyopia: Secondary | ICD-10-CM | POA: Diagnosis not present

## 2018-04-20 DIAGNOSIS — H538 Other visual disturbances: Secondary | ICD-10-CM | POA: Diagnosis not present

## 2018-04-24 MED FILL — HYDROXYCHLOROQUINE SULFATE: 200 | 90 days supply | Qty: 180 | Fill #0

## 2018-04-24 MED FILL — FREESTYLE LIBRE 14 DAY SENS: 14 days supply | Qty: 1 | Fill #6

## 2018-04-25 DIAGNOSIS — M2550 Pain in unspecified joint: Secondary | ICD-10-CM | POA: Diagnosis not present

## 2018-04-25 DIAGNOSIS — Z6832 Body mass index (BMI) 32.0-32.9, adult: Secondary | ICD-10-CM | POA: Diagnosis not present

## 2018-04-25 DIAGNOSIS — M25512 Pain in left shoulder: Secondary | ICD-10-CM | POA: Diagnosis not present

## 2018-04-25 DIAGNOSIS — E65 Localized adiposity: Secondary | ICD-10-CM | POA: Diagnosis not present

## 2018-04-25 DIAGNOSIS — M150 Primary generalized (osteo)arthritis: Secondary | ICD-10-CM | POA: Diagnosis not present

## 2018-04-25 DIAGNOSIS — L659 Nonscarring hair loss, unspecified: Secondary | ICD-10-CM | POA: Diagnosis not present

## 2018-04-25 DIAGNOSIS — M5136 Other intervertebral disc degeneration, lumbar region: Secondary | ICD-10-CM | POA: Diagnosis not present

## 2018-04-25 DIAGNOSIS — Z79899 Other long term (current) drug therapy: Secondary | ICD-10-CM | POA: Diagnosis not present

## 2018-04-25 DIAGNOSIS — M0589 Other rheumatoid arthritis with rheumatoid factor of multiple sites: Secondary | ICD-10-CM | POA: Diagnosis not present

## 2018-04-25 DIAGNOSIS — E669 Obesity, unspecified: Secondary | ICD-10-CM | POA: Diagnosis not present

## 2018-05-24 ENCOUNTER — Ambulatory Visit (HOSPITAL_COMMUNITY)
Admission: EM | Admit: 2018-05-24 | Discharge: 2018-05-24 | Disposition: A | Discharge status: Home / Self Care | Payer: 59 | Attending: Family Medicine | Admitting: Family Medicine

## 2018-05-24 ENCOUNTER — Encounter (HOSPITAL_COMMUNITY): Payer: Self-pay

## 2018-05-24 DIAGNOSIS — G5701 Lesion of sciatic nerve, right lower limb: Secondary | ICD-10-CM

## 2018-05-24 MED ORDER — PREDNISONE 10 MG (21) PO TBPK: ORAL_TABLET | Freq: Every day | ORAL | 0 refills | Status: AC

## 2018-05-24 MED ORDER — KETOROLAC TROMETHAMINE 60 MG/2ML IM SOLN
INTRAMUSCULAR | Status: AC
  Filled 2018-05-24: qty 2

## 2018-05-24 MED ORDER — KETOROLAC TROMETHAMINE 60 MG/2ML IM SOLN
60.0000 mg | Freq: Once | INTRAMUSCULAR | Status: AC
  Administered 2018-05-24: 60 mg via INTRAMUSCULAR

## 2018-05-24 MED FILL — METHOTREXATE 25 MG/ML VIAL: 50 | 30 days supply | Qty: 4 | Fill #0

## 2018-05-24 MED FILL — FREESTYLE LIBRE 14 DAY SENS: 84 days supply | Qty: 6 | Fill #7

## 2018-05-24 MED FILL — traMADol HCL 50 MG TABS: 50 | 90 days supply | Qty: 180 | Fill #0

## 2018-05-24 MED FILL — predniSONE 10 MG TABS: 10 | 6 days supply | Qty: 21 | Fill #0

## 2018-05-24 MED FILL — ALPRAZolam 1 MG TABS: 1 | 90 days supply | Qty: 270 | Fill #1

## 2018-05-24 MED FILL — MUPIROCIN 2% OINTMENT: 2 | 15 days supply | Qty: 22 | Fill #1

## 2018-05-24 NOTE — Discharge Instructions (Addendum)
It was nice meeting you!!  I believe you have a spasm of the piriformis muscle  which is causing sciatic nerve pain.  We will give you a Toradol shot here.  Sending you home with some prednisone for the inflammation.  Be aware that the prednisone can increase your blood sugars.  Continue taking your flexeril for the muscle spasm.  I will also give you some stretching instructions that can help.  Please return if not better in the next couple of days.

## 2018-05-24 NOTE — ED Provider Notes (Signed)
[place]    CSN: [id number] Arrival date & time: 05/24/18  4132     History   Chief Complaint Chief Complaint  Patient presents with  . Leg Injury    HPI Amy Adams is a 52 y.o. female.   Patient is a 52 year old female with history of diabetes that presents with 4 days of right buttocks pain with radiation into right thigh.  This started after working out at the gym pushing weight with her legs.  The pain has gotten worse with some weakness in the leg.  She reports the pain as sharp and throbbing.  Denies any numbness or tingling.  She denies any injuries to the hip or leg.  She has already been taking Flexeril that she was previously prescribed with minimal relief.       Past Medical History:  Diagnosis Date  . Anxiety   . Arthritis   . Diabetes mellitus without complication ([place])   . History of migraine headaches   . Hyperlipidemia   . Hypertension   . Thyroid disease     Patient Active Problem List   Diagnosis Date Noted  . HYPOTHYROIDISM 12/01/2010  . DM 12/01/2010  . 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 OTHER AND UNSPECIFIED HYPERLIPIDEMIA 12/01/2010  . OBESITY, UNSPECIFIED 12/01/2010  . ESSENTIAL HYPERTENSION, BENIGN 12/01/2010  . RHEUMATOID ARTHRITIS 12/01/2010    Past Surgical History:  Procedure Laterality Date  . ABDOMINAL HYSTERECTOMY    . CARPAL TUNNEL RELEASE    . CESAREAN SECTION    . ENDOSCOPIC PLANTAR FASCIOTOMY Left   . HAMMER TOE SURGERY Left   . METATARSAL OSTEOTOMY Left   . phenol matrixectomy Right    1st toenail  . TONSILLECTOMY      OB History   None      Home Medications    Prior to Admission medications   Medication Sig Start Date End Date Taking? Authorizing Provider  albuterol (PROVENTIL HFA;VENTOLIN HFA) 108 (90 BASE) MCG/ACT inhaler Inhale 1-2 puffs into the lungs every 6 (six) hours as needed for wheezing or shortness of breath.    [provider]  ALPRAZolam  Duanne Moron) 1 MG tablet Take 1 mg by mouth 3 (three) times daily as needed for sleep.    [provider]  amLODipine (NORVASC) 10 MG tablet Take 10 mg by mouth daily.    [provider]  atorvastatin (LIPITOR) 40 MG tablet Take 40 mg by mouth daily.    [provider]  cyclobenzaprine (FLEXERIL) 10 MG tablet TAKE 1 TABLET BY MOUTH 3 TIMES DAILY 04/19/18   Hyatt, Max T, DPM  Dietary Management Product (RHEUMATE) CAPS Take 1 capsule by mouth daily.    [provider]  DULoxetine (CYMBALTA) 60 MG capsule Take 120 mg by mouth at bedtime.    [provider]  estradiol (ESTRACE) 0.5 MG tablet Take 0.5 mg by mouth daily.    [provider]  fenofibrate micronized (LOFIBRA) 134 MG capsule Take 134 mg by mouth daily.    [provider]  folic acid (FOLVITE) 1 MG tablet Take 2 mg by mouth daily.     [provider]  gabapentin (NEURONTIN) 400 MG capsule TAKE 1 CAPSULE BY MOUTH 3 TIMES DAILY. 01/31/18   Hyatt, Max T, DPM  hydrochlorothiazide (HYDRODIURIL) 25 MG tablet Take 25 mg by mouth daily.    [provider]  hydroxychloroquine (PLAQUENIL) 200 MG tablet Take 200 mg by mouth 2 (two) times daily.  [provider]  ibuprofen (ADVIL,MOTRIN) 800 MG tablet Take 1 tablet (800 mg total) by mouth every 8 (eight) hours as needed. Patient not taking: Reported on 04/28/2017 10/02/15   Trula Slade, DPM  insulin glargine (LANTUS SOLOSTAR) 100 UNIT/ML injection Inject 55 Units into the skin 2 (two) times daily.     [provider]  insulin lispro (HUMALOG KWIKPEN) 100 UNIT/ML injection Inject 12 Units into the skin 3 (three) times daily before meals. 12-15 units sliding scale    [provider]  levocetirizine (XYZAL) 5 MG tablet Take 5 mg by mouth every evening.    [provider]  levothyroxine (SYNTHROID, LEVOTHROID) 50 MCG tablet Take 50 mcg by mouth daily.    [provider]  losartan  (COZAAR) 100 MG tablet Take 100 mg by mouth daily.    [provider]  metFORMIN (GLUCOPHAGE-XR) 500 MG 24 hr tablet Take 1,000 mg by mouth 2 (two) times daily.     [provider]  methotrexate 25 MG/ML SOLN 20 mg once a week. Inject 0.8 mL once weekly at bedtime. Every Thursday    [provider]  montelukast (SINGULAIR) 10 MG tablet Take 10 mg by mouth at bedtime.    [provider]  omeprazole (PRILOSEC) 40 MG capsule Take 40 mg by mouth 2 (two) times daily.    [provider]  predniSONE (STERAPRED UNI-PAK 21 TAB) 10 MG (21) TBPK tablet Take by mouth daily for 6 days. Per box instructions 05/24/18 05/30/18  Loura Halt A, NP  traMADol (ULTRAM) 50 MG tablet Take 1 tablet (50 mg total) by mouth every 8 (eight) hours as needed. 12/13/16   Hyatt, Max T, DPM  zolpidem (AMBIEN CR) 12.5 MG CR tablet Take 12.5 mg by mouth at bedtime as needed for sleep.    [provider]    Family History Family History  Problem Relation Age of Onset  . Diabetes Mother   . Migraines Mother   . Diabetes Sister     Social History Social History   Tobacco Use  . Smoking status: Never Smoker  . Smokeless tobacco: Never Used  Substance Use Topics  . Alcohol use: No  . Drug use: No     Allergies   Dilaudid [hydromorphone hcl]; Doxycycline; and Trazodone and nefazodone   Review of Systems Review of Systems  Constitutional: Negative for chills and fever.  Musculoskeletal: Positive for myalgias.  Skin: Negative for color change and rash.  Allergic/Immunologic: Negative for immunocompromised state.  Neurological: Positive for weakness. Negative for numbness.  Hematological: Does not bruise/bleed easily.  All other systems reviewed and are negative.    Physical Exam Triage Vital Signs ED Triage Vitals  Enc Vitals Group     BP 05/24/18 0841 136/86     Pulse Rate 05/24/18 0841 79     Resp 05/24/18 0841 20     Temp 05/24/18 0841 98.3 F (36.8 C)      Temp Source 05/24/18 0841 Oral     SpO2 05/24/18 0841 98 %     Weight --      Height --      Head Circumference --      Peak Flow --      Pain Score 05/24/18 0840 10     Pain Loc --      Pain Edu? --      Excl. in [place]? --    No data found.  Updated Vital Signs BP 136/86 (BP Location:  Right Arm)   Pulse 79   Temp 98.3 F (36.8 C) (Oral)   Resp 20   SpO2 98%   Visual Acuity Right Eye Distance:   Left Eye Distance:   Bilateral Distance:    Right Eye Near:   Left Eye Near:    Bilateral Near:     Physical Exam  Constitutional: She is oriented to person, place, and time. She appears well-developed and well-nourished.  HENT:  Head: Normocephalic and atraumatic.  Neck: Normal range of motion.  Pulmonary/Chest: Effort normal.  Musculoskeletal:  Positive straight leg test.  Muscle spasm palpated in right lateral thigh. No erythema or ecchymosis.   Neurological: She is alert and oriented to person, place, and time.  Skin: Skin is warm. Capillary refill takes less than 2 seconds.  Psychiatric: She has a normal mood and affect.  Nursing note and vitals reviewed.    UC Treatments / Results  Labs (all labs ordered are listed, but only abnormal results are displayed) Labs Reviewed - No data to display  EKG None  Radiology No results found.  Procedures Procedures (including critical care time)  Medications Ordered in UC Medications  ketorolac (TORADOL) injection 60 mg (60 mg Intramuscular Given 05/24/18 0909)    Initial Impression / Assessment and Plan / UC Course  I have reviewed the triage vital signs and the nursing notes.  Pertinent labs & imaging results that were available during my care of the patient were reviewed by me and considered in my medical decision making (see chart for details).     Straight leg test positive with muscle spasm palpated in the right piriform muscle. Most likely piriform syndrome. Will treat with muscle relaxer and steroids.  Gave Toradol injection in the clinic. cautioned about prednisone and how it could increase her blood sugars.  Patient agreeable to plan.  Told to follow-up with PCP if not better in the next week. Final Clinical Impressions(s) / UC Diagnoses   Final diagnoses:  Piriformis syndrome of right side     Discharge Instructions     It was nice meeting you!!  I believe you have a spasm of the piriformis muscle  which is causing sciatic nerve pain.  We will give you a Toradol shot here.  Sending you home with some prednisone for the inflammation.  Be aware that the prednisone can increase your blood sugars.  Continue taking your flexeril for the muscle spasm.  I will also give you some stretching instructions that can help.  Please return if not better in the next couple of days.    ED Prescriptions    Medication Sig Dispense Auth. Provider   predniSONE (STERAPRED UNI-PAK 21 TAB) 10 MG (21) TBPK tablet Take by mouth daily for 6 days. Per box instructions 21 tablet Loura Halt A, NP     Controlled Substance Prescriptions [place] Controlled Substance Registry consulted? Not Applicable   Orvan July, NP 05/24/18 1021

## 2018-05-24 NOTE — ED Triage Notes (Signed)
Pt presents with right thigh and hip pain from excercising machine at gym.

## 2018-06-08 DIAGNOSIS — E119 Type 2 diabetes mellitus without complications: Secondary | ICD-10-CM | POA: Diagnosis not present

## 2018-06-08 DIAGNOSIS — M793 Panniculitis, unspecified: Secondary | ICD-10-CM | POA: Diagnosis not present

## 2018-06-13 DIAGNOSIS — E119 Type 2 diabetes mellitus without complications: Secondary | ICD-10-CM | POA: Diagnosis not present

## 2018-06-13 DIAGNOSIS — H40013 Open angle with borderline findings, low risk, bilateral: Secondary | ICD-10-CM | POA: Diagnosis not present

## 2018-06-13 DIAGNOSIS — M069 Rheumatoid arthritis, unspecified: Secondary | ICD-10-CM | POA: Diagnosis not present

## 2018-06-13 DIAGNOSIS — Z79899 Other long term (current) drug therapy: Secondary | ICD-10-CM | POA: Diagnosis not present

## 2018-06-19 DIAGNOSIS — E7800 Pure hypercholesterolemia, unspecified: Secondary | ICD-10-CM | POA: Diagnosis not present

## 2018-06-19 DIAGNOSIS — G609 Hereditary and idiopathic neuropathy, unspecified: Secondary | ICD-10-CM | POA: Diagnosis not present

## 2018-06-19 DIAGNOSIS — I10 Essential (primary) hypertension: Secondary | ICD-10-CM | POA: Diagnosis not present

## 2018-06-19 DIAGNOSIS — E049 Nontoxic goiter, unspecified: Secondary | ICD-10-CM | POA: Diagnosis not present

## 2018-06-19 DIAGNOSIS — E039 Hypothyroidism, unspecified: Secondary | ICD-10-CM | POA: Diagnosis not present

## 2018-06-19 DIAGNOSIS — E1165 Type 2 diabetes mellitus with hyperglycemia: Secondary | ICD-10-CM | POA: Diagnosis not present

## 2018-06-19 MED FILL — CEFUROXIME AXETIL 500 MG TA: 500 | 14 days supply | Qty: 28 | Fill #0

## 2018-06-19 MED FILL — UNIFINE PENTIPS 31GX3/16: 31G X 5 MM | 80 days supply | Qty: 400 | Fill #0

## 2018-06-19 MED FILL — UNIFINE PENTIPS 31GX3/16": 31G X 5 MM | 80 days supply | Qty: 400 | Fill #0

## 2018-06-20 MED FILL — FLUCONAZOLE 150 MG TABS: 150 | 3 days supply | Qty: 3 | Fill #0

## 2018-06-27 MED FILL — HUMALOG 100 UNITS/ML KWIKPE: 100 | 45 days supply | Qty: 45 | Fill #2

## 2018-06-27 MED FILL — LANTUS SOLOSTAR 100 UNITS/M: 100 | 84 days supply | Qty: 105 | Fill #1

## 2018-07-02 MED FILL — METFORMIN HCL ER 500 MG TAB: 500 | 90 days supply | Qty: 360 | Fill #1

## 2018-07-03 MED FILL — LEVOTHYROXINE 50 MCG TABLET: 50 | 90 days supply | Qty: 90 | Fill #0

## 2018-07-06 DIAGNOSIS — Z209 Contact with and (suspected) exposure to unspecified communicable disease: Secondary | ICD-10-CM | POA: Diagnosis not present

## 2018-07-10 ENCOUNTER — Ambulatory Visit: Payer: 59 | Admitting: Podiatry

## 2018-07-10 ENCOUNTER — Encounter: Payer: Self-pay | Admitting: Podiatry

## 2018-07-10 DIAGNOSIS — G5752 Tarsal tunnel syndrome, left lower limb: Secondary | ICD-10-CM

## 2018-07-10 DIAGNOSIS — M7750 Other enthesopathy of unspecified foot: Secondary | ICD-10-CM

## 2018-07-10 DIAGNOSIS — M7752 Other enthesopathy of left foot: Secondary | ICD-10-CM | POA: Diagnosis not present

## 2018-07-10 MED ORDER — CYCLOBENZAPRINE HCL 10 MG PO TABS: 10.0000 mg | ORAL_TABLET | Freq: Three times a day (TID) | ORAL | 3 refills | Status: DC

## 2018-07-10 MED FILL — CYCLOBENZAPRINE 10 MG TAB: 10 | 30 days supply | Qty: 90 | Fill #0

## 2018-07-10 NOTE — Progress Notes (Signed)
She presents today states that she still has some neuropathic type pains along the plantar medial aspect of the left foot still has some spasms at nighttime and is currently out of her cyclobenzaprine.  She continues to lose weight and exercise regularly.  Objective: Vital signs are stable she is alert and oriented x3 has tenderness on palpation of the medial plantar nerve as it courses beneath the navicular and first ray.  Assessment: Tarsal tunnel neuritis.  Plan: I injected the area today with Kenalog and local anesthetic after sterile Betadine skin prep 10 mg of Kenalog 5 mg Marcaine was utilized.  Tolerated procedure well without complications.  Another prescription for cyclobenzaprine and will follow-up with her in 3 months.

## 2018-07-11 MED FILL — PROAIR HFA 90 MCG INHALER: 108 (90 BAS | 75 days supply | Qty: 26 | Fill #0

## 2018-07-12 MED FILL — FREESTYLE LIBRE 14 DAY SENS: 14 days supply | Qty: 1 | Fill #8

## 2018-07-13 MED FILL — FREESTYLE LIBRE 14 DAY SENS: 14 days supply | Qty: 1 | Fill #9

## 2018-07-17 MED FILL — FREESTYLE LIBRE 14 DAY SENS: 14 days supply | Qty: 1 | Fill #10

## 2018-07-24 DIAGNOSIS — E669 Obesity, unspecified: Secondary | ICD-10-CM | POA: Diagnosis not present

## 2018-07-24 DIAGNOSIS — M0589 Other rheumatoid arthritis with rheumatoid factor of multiple sites: Secondary | ICD-10-CM | POA: Diagnosis not present

## 2018-07-24 DIAGNOSIS — M2550 Pain in unspecified joint: Secondary | ICD-10-CM | POA: Diagnosis not present

## 2018-07-24 DIAGNOSIS — L659 Nonscarring hair loss, unspecified: Secondary | ICD-10-CM | POA: Diagnosis not present

## 2018-07-24 DIAGNOSIS — M25512 Pain in left shoulder: Secondary | ICD-10-CM | POA: Diagnosis not present

## 2018-07-24 DIAGNOSIS — M150 Primary generalized (osteo)arthritis: Secondary | ICD-10-CM | POA: Diagnosis not present

## 2018-07-24 DIAGNOSIS — Z79899 Other long term (current) drug therapy: Secondary | ICD-10-CM | POA: Diagnosis not present

## 2018-07-24 DIAGNOSIS — M5136 Other intervertebral disc degeneration, lumbar region: Secondary | ICD-10-CM | POA: Diagnosis not present

## 2018-07-24 DIAGNOSIS — Z6832 Body mass index (BMI) 32.0-32.9, adult: Secondary | ICD-10-CM | POA: Diagnosis not present

## 2018-07-24 MED FILL — MONTELUKAST SOD 10 MG TAB: 10 | 90 days supply | Qty: 90 | Fill #1

## 2018-07-24 MED FILL — HYDROXYCHLOROQUINE SULFATE: 200 | 90 days supply | Qty: 180 | Fill #0

## 2018-07-24 MED FILL — HYDROCHLOROTHIAZIDE 25 MG T: 25 | 90 days supply | Qty: 90 | Fill #2

## 2018-07-24 MED FILL — OMEPRAZOLE 40 MG CPDR: 40 | 90 days supply | Qty: 180 | Fill #1

## 2018-07-24 MED FILL — LEVOCETIRIZINE 5 MG TABLET: 5 | 90 days supply | Qty: 90 | Fill #1

## 2018-07-24 MED FILL — FOLIC ACID 1 MG TABS: 1 | 90 days supply | Qty: 180 | Fill #4

## 2018-07-24 MED FILL — ESTRADIOL 0.5 MG TABLET: 0.5 | 90 days supply | Qty: 45 | Fill #3

## 2018-07-24 MED FILL — ZOLPIDEM TART ER 12.5 MG TA: 12.5 | 90 days supply | Qty: 90 | Fill #1

## 2018-07-25 MED FILL — DULoxetine HCL 60 MG CPEP: 60 | 90 days supply | Qty: 180 | Fill #0

## 2018-07-30 MED FILL — traMADol HCL 50 MG TABS: 50 | 90 days supply | Qty: 270 | Fill #0

## 2018-08-09 DIAGNOSIS — Z1231 Encounter for screening mammogram for malignant neoplasm of breast: Secondary | ICD-10-CM | POA: Diagnosis not present

## 2018-08-09 DIAGNOSIS — Z6831 Body mass index (BMI) 31.0-31.9, adult: Secondary | ICD-10-CM | POA: Diagnosis not present

## 2018-08-09 DIAGNOSIS — R350 Frequency of micturition: Secondary | ICD-10-CM | POA: Diagnosis not present

## 2018-08-09 DIAGNOSIS — Z01419 Encounter for gynecological examination (general) (routine) without abnormal findings: Secondary | ICD-10-CM | POA: Diagnosis not present

## 2018-08-14 DIAGNOSIS — E559 Vitamin D deficiency, unspecified: Secondary | ICD-10-CM | POA: Diagnosis not present

## 2018-08-14 DIAGNOSIS — M069 Rheumatoid arthritis, unspecified: Secondary | ICD-10-CM | POA: Diagnosis not present

## 2018-08-14 DIAGNOSIS — F324 Major depressive disorder, single episode, in partial remission: Secondary | ICD-10-CM | POA: Diagnosis not present

## 2018-08-14 DIAGNOSIS — E039 Hypothyroidism, unspecified: Secondary | ICD-10-CM | POA: Diagnosis not present

## 2018-08-14 DIAGNOSIS — F419 Anxiety disorder, unspecified: Secondary | ICD-10-CM | POA: Diagnosis not present

## 2018-08-14 DIAGNOSIS — E1142 Type 2 diabetes mellitus with diabetic polyneuropathy: Secondary | ICD-10-CM | POA: Diagnosis not present

## 2018-08-14 DIAGNOSIS — K219 Gastro-esophageal reflux disease without esophagitis: Secondary | ICD-10-CM | POA: Diagnosis not present

## 2018-08-14 DIAGNOSIS — E782 Mixed hyperlipidemia: Secondary | ICD-10-CM | POA: Diagnosis not present

## 2018-08-14 DIAGNOSIS — G4700 Insomnia, unspecified: Secondary | ICD-10-CM | POA: Diagnosis not present

## 2018-08-14 DIAGNOSIS — I10 Essential (primary) hypertension: Secondary | ICD-10-CM | POA: Diagnosis not present

## 2018-08-14 MED FILL — GABAPENTIN 400 MG CAPSULE: 400 | 90 days supply | Qty: 270 | Fill #0

## 2018-08-14 MED FILL — LOSARTAN POTASSIUM 100 MG T: 100 | 90 days supply | Qty: 90 | Fill #0

## 2018-08-14 MED FILL — ATORVASTATIN 40 MG TABLET: 40 | 90 days supply | Qty: 90 | Fill #0

## 2018-08-14 MED FILL — FENOFIBRATE 134 MG CAPSULE: 134 | 90 days supply | Qty: 90 | Fill #0

## 2018-08-27 MED FILL — FREESTYLE LIBRE 14 DAY SENS: 56 days supply | Qty: 4 | Fill #11

## 2018-09-04 ENCOUNTER — Ambulatory Visit: Payer: 59 | Admitting: Plastic Surgery

## 2018-09-04 ENCOUNTER — Encounter: Payer: Self-pay | Admitting: Plastic Surgery

## 2018-09-04 ENCOUNTER — Other Ambulatory Visit: Payer: Self-pay

## 2018-09-04 VITALS — BP 152/98 | HR 94 | Ht 69.0 in | Wt 210.0 lb

## 2018-09-04 DIAGNOSIS — M069 Rheumatoid arthritis, unspecified: Secondary | ICD-10-CM

## 2018-09-04 DIAGNOSIS — M793 Panniculitis, unspecified: Secondary | ICD-10-CM | POA: Diagnosis not present

## 2018-09-04 DIAGNOSIS — Z794 Long term (current) use of insulin: Secondary | ICD-10-CM | POA: Diagnosis not present

## 2018-09-04 DIAGNOSIS — E1142 Type 2 diabetes mellitus with diabetic polyneuropathy: Secondary | ICD-10-CM | POA: Diagnosis not present

## 2018-09-04 NOTE — Progress Notes (Signed)
Patient ID: Amy Adams, female    DOB: [birth year], 52 y.o.   MRN: [id number]   Chief Complaint  Patient presents with  . Skin removal    The patient is a 52 year old white female here for evaluation of her pannus.  She is 5 feet 9 inches tall, weight is 210 pounds.  She has been this weight for approximately 3 months steadily.  She was 300 pounds 2 years ago and has been able to decrease her weight with diet and exercise.  She has not had gastric bypass.  She complains of rashes and yeast infections in the intertriginous area of her pannus and pubis area.  She has multiple medical conditions including diabetes neuropathy rheumatoid arthritis and thyroid disease.  She is currently taking methotrexate.  She has had multiple surgeries including a hysterectomy, tonsils, carpal tunnel release bilaterally, and foot surgery.  Today she complains of left shoulder pain and right knee pain.  She says it is difficult to continue to do strenuous exercise with the excess weight.  She is hoping to have the pannus removed for improvement in her insulin requirements as well.  She has seen Dr. Hassell Done for this surgery and is considering that route.   Review of Systems  Constitutional: Positive for activity change. Negative for appetite change.  HENT: Negative.   Eyes: Negative.   Respiratory: Negative.   Cardiovascular: Negative.   Gastrointestinal: Negative.   Endocrine: Negative.   Genitourinary: Negative.   Musculoskeletal: Positive for back pain.  Skin: Positive for color change.  Neurological: Negative.   Hematological: Negative.   Psychiatric/Behavioral: Negative.     Past Medical History:  Diagnosis Date  . Anxiety   . Arthritis   . Diabetes mellitus without complication ([place])   . History of migraine headaches   . Hyperlipidemia   . Hypertension   . Thyroid disease     Past Surgical History:  Procedure Laterality Date  . ABDOMINAL HYSTERECTOMY    . CARPAL TUNNEL RELEASE    .  CESAREAN SECTION    . ENDOSCOPIC PLANTAR FASCIOTOMY Left   . HAMMER TOE SURGERY Left   . METATARSAL OSTEOTOMY Left   . phenol matrixectomy Right    1st toenail  . TONSILLECTOMY        Current Outpatient Medications:  .  albuterol (PROVENTIL HFA;VENTOLIN HFA) 108 (90 BASE) MCG/ACT inhaler, Inhale 1-2 puffs into the lungs every 6 (six) hours as needed for wheezing or shortness of breath., Disp: , Rfl:  .  ALPRAZolam (XANAX) 1 MG tablet, Take 1 mg by mouth 3 (three) times daily as needed for sleep., Disp: , Rfl:  .  amLODipine (NORVASC) 10 MG tablet, Take 10 mg by mouth daily., Disp: , Rfl:  .  atorvastatin (LIPITOR) 40 MG tablet, Take 40 mg by mouth daily., Disp: , Rfl:  .  Continuous Blood Gluc Sensor (FREESTYLE LIBRE 14 DAY SENSOR) MISC, USE AS DIRECTED EVERY 2 WEEKS SUBCUTANEOUSLY, Disp: , Rfl: 4 .  cyclobenzaprine (FLEXERIL) 10 MG tablet, Take 1 tablet (10 mg total) by mouth 3 (three) times daily., Disp: 90 tablet, Rfl: 3 .  Dietary Management Product (RHEUMATE) CAPS, Take 1 capsule by mouth daily., Disp: , Rfl:  .  DULoxetine (CYMBALTA) 60 MG capsule, Take 120 mg by mouth at bedtime., Disp: , Rfl:  .  estradiol (ESTRACE) 0.5 MG tablet, Take 0.5 mg by mouth daily., Disp: , Rfl:  .  fenofibrate micronized (LOFIBRA) 134 MG capsule, Take 134 mg by  mouth daily., Disp: , Rfl:  .  folic acid (FOLVITE) 1 MG tablet, Take 2 mg by mouth daily. , Disp: , Rfl:  .  gabapentin (NEURONTIN) 400 MG capsule, TAKE 1 CAPSULE BY MOUTH 3 TIMES DAILY., Disp: 270 capsule, Rfl: 3 .  hydrochlorothiazide (HYDRODIURIL) 25 MG tablet, Take 25 mg by mouth daily., Disp: , Rfl:  .  hydroxychloroquine (PLAQUENIL) 200 MG tablet, Take 200 mg by mouth 2 (two) times daily., Disp: , Rfl:  .  ibuprofen (ADVIL,MOTRIN) 800 MG tablet, Take 1 tablet (800 mg total) by mouth every 8 (eight) hours as needed., Disp: 30 tablet, Rfl: 0 .  insulin glargine (LANTUS SOLOSTAR) 100 UNIT/ML injection, Inject 55 Units into the skin 2 (two)  times daily. , Disp: , Rfl:  .  insulin lispro (HUMALOG KWIKPEN) 100 UNIT/ML injection, Inject 12 Units into the skin 3 (three) times daily before meals. 12-15 units sliding scale, Disp: , Rfl:  .  levocetirizine (XYZAL) 5 MG tablet, Take 5 mg by mouth every evening., Disp: , Rfl:  .  levothyroxine (SYNTHROID, LEVOTHROID) 50 MCG tablet, Take 50 mcg by mouth daily., Disp: , Rfl:  .  losartan (COZAAR) 100 MG tablet, Take 100 mg by mouth daily., Disp: , Rfl:  .  metFORMIN (GLUCOPHAGE-XR) 500 MG 24 hr tablet, Take 1,000 mg by mouth 2 (two) times daily. , Disp: , Rfl:  .  methotrexate 25 MG/ML SOLN, 20 mg once a week. Inject 0.8 mL once weekly at bedtime. Every Thursday, Disp: , Rfl:  .  montelukast (SINGULAIR) 10 MG tablet, Take 10 mg by mouth at bedtime., Disp: , Rfl:  .  mupirocin ointment (BACTROBAN) 2 %, APPLY TO AFFECTED AREA THREE TIMES A DAY, Disp: , Rfl: 1 .  nystatin-triamcinolone ointment (MYCOLOG), APPLY SPARINGLY TO AFFECTED AREA(S) TWICE DAILY, Disp: , Rfl:  .  omeprazole (PRILOSEC) 40 MG capsule, Take 40 mg by mouth 2 (two) times daily., Disp: , Rfl:  .  traMADol (ULTRAM) 50 MG tablet, Take 1 tablet (50 mg total) by mouth every 8 (eight) hours as needed., Disp: 30 tablet, Rfl: 0 .  UNIFINE PENTIPS 31G X 5 MM MISC, USE AS DIRECTED 5 TIMES PER DAY, Disp: , Rfl: 4 .  valACYclovir (VALTREX) 1000 MG tablet, TAKE 2 TABLETS BY MOUTH TWICE A DAY FOR 1 DAY WHEN NEEDED, Disp: , Rfl: 3 .  zolpidem (AMBIEN CR) 12.5 MG CR tablet, Take 12.5 mg by mouth at bedtime as needed for sleep., Disp: , Rfl:    Objective:   Vitals:   09/04/18 1319  BP: (!) 152/98  Pulse: 94  SpO2: 98%    Physical Exam  Constitutional: She is oriented to person, place, and time. She appears well-developed and well-nourished.  HENT:  Head: Normocephalic and atraumatic.  Eyes: Pupils are equal, round, and reactive to light. EOM are normal.  Cardiovascular: Normal rate.  Pulmonary/Chest: Effort normal.  Abdominal: Soft.  She exhibits no distension. There is no tenderness. No hernia.  Neurological: She is alert and oriented to person, place, and time.  Skin: Skin is warm.  Psychiatric: She has a normal mood and affect. Her behavior is normal. Judgment and thought content normal.    Assessment & Plan:  Rheumatoid arthritis involving multiple sites, unspecified rheumatoid factor presence ([place])  Type 2 diabetes mellitus with diabetic polyneuropathy, with long-term current use of insulin ([facility])  Panniculitis  The patient is a candidate for panniculectomy however I cautioned the patient that she is at an extremely high  risk of complications after surgery.  I recommend that she not have surgery until her hemoglobin A1c is under 6.  I also recommend that she stop taking the methotrexate for at least 1 month prior to surgery and 1 month after surgery.  She would be a good candidate for a postoperative VAC.  [place], DO

## 2018-09-24 MED FILL — ALPRAZolam 1 MG TABS: 1 | 90 days supply | Qty: 270 | Fill #0

## 2018-09-24 MED FILL — CYCLOBENZAPRINE 10 MG TAB: 10 | 30 days supply | Qty: 90 | Fill #1

## 2018-10-02 MED FILL — FREESTYLE LIBRE 14 DAY SENS: 14 days supply | Qty: 1 | Fill #0

## 2018-10-03 MED FILL — valACYclovir HCL 1 GM TABS: 1 | 3 days supply | Qty: 12 | Fill #0

## 2018-10-03 MED FILL — FREESTYLE LIBRE 14 DAY SENS: 14 days supply | Qty: 1 | Fill #1

## 2018-10-09 ENCOUNTER — Ambulatory Visit: Payer: 59 | Admitting: Podiatry

## 2018-10-09 DIAGNOSIS — G5762 Lesion of plantar nerve, left lower limb: Secondary | ICD-10-CM | POA: Diagnosis not present

## 2018-10-10 NOTE — Progress Notes (Signed)
She comes in today presenting with pain to the dorsal aspect of the left foot distally between the second metatarsal and third metatarsal.  She has amputation of the second toe.  Objective: Vital signs stable she alert and oriented x3 pulses are strong she continues to lose weight she is scheduled in December for surgery to remove fat around her abdomen.  And excessive skin after her weight loss  At this point she has strong palpable pulses with pain between the second and third metatarsals is consistent with neuroma there is no edema no erythema cellulitis drainage or odor.  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  Plan: At this point I went ahead and injected 10 mg of Kenalog 5 mg Marcaine point maximal tenderness.  See if this alleviate her symptoms.  She needs to be scheduled to have new orthotics made.

## 2018-10-17 ENCOUNTER — Encounter: Payer: 59 | Attending: Surgery | Admitting: Skilled Nursing Facility1

## 2018-10-17 ENCOUNTER — Encounter: Payer: Self-pay | Admitting: Skilled Nursing Facility1

## 2018-10-17 DIAGNOSIS — Z794 Long term (current) use of insulin: Secondary | ICD-10-CM | POA: Insufficient documentation

## 2018-10-17 DIAGNOSIS — E1142 Type 2 diabetes mellitus with diabetic polyneuropathy: Secondary | ICD-10-CM | POA: Insufficient documentation

## 2018-10-17 NOTE — Progress Notes (Signed)
Diabetes Self-Management Education Follow Up visit  Visit Type:     Appt. Start Time: 0945 Appt. End Time: 1610  10/17/2018  [name], identified by name and date of birth, is a 52 y.o. female with a diagnosis of Diabetes:  .    Pt states she wants to balance her diabetes management while continuing to lose weight and prepare for her paniculectomy. Pt state she has bought an air fryer to start using. Pt states she is under a tremendous amount of stress and struggles to eat due to a lack of appetite. Pt states she continuous to struggle to control her blood sugar and constantly has lows: Dietitian related this to her inconsistent dietary habits.  Pt was educated on the need to fuel her body appropriately through food in order to have the outcome of weight loss and balanced blood sugars. Dietitian gave pt the crisis management which the pt took. Pt was educated on her upcoming surgery due to the pts confusion of thinking she was getting metabolic surgery.   24 hr recall: 3 meals a day 3 days a week Oatmeal and walnuts or skipped 2 Mini candy bars or boiled egg roastbeef sandwich or skipped  Potato salad and pinto beans and whipped cream or skipped   Weight: 208.6 pounds  TANITA  BODY COMP RESULTS  10/17/2018   BMI (kg/m^2) 30.8   Fat Mass (lbs) 100   Fat Free Mass (lbs) 108.6   Total Body Water (lbs) 78.2    Goals: Eat within 1-1.5 hours of wake and then every 3 hours after that Start working with a mental health professional on your relationship with food  Go grocery shopping 1-2 times a week Create balanced meals using your meal ideas sheet   ASSESSMENT  Height 5\' 9"  (1.753 m), weight 208 lb 9.6 oz (94.6 kg). Body mass index is 30.8 kg/m.

## 2018-10-19 DIAGNOSIS — M793 Panniculitis, unspecified: Secondary | ICD-10-CM | POA: Diagnosis not present

## 2018-10-19 NOTE — Patient Instructions (Addendum)
Amy Adams  1965-11-21    Your procedure is scheduled on:  10-26-2018    Report to [facility] Main  Entrance,   Report to admitting at  7:30 AM    Call this number if you have problems the morning of surgery [phone]        Remember: NO SOLID FOOD AFTER MIDNIGHT THE NIGHT PRIOR TO SURGERY. NOTHING BY MOUTH EXCEPT CLEAR LIQUIDS UNTIL 3 HOURS PRIOR TO [place] SURGERY. PLEASE FINISH ENSURE   DRINK PER SURGEON ORDER 3 HOURS PRIOR TO SCHEDULED SURGERY TIME WHICH NEEDS TO BE COMPLETED AT __6:30 AM_____.                                        BRUSH YOUR TEETH MORNING OF SURGERY AND RINSE YOUR MOUTH OUT, NO CHEWING GUM CANDY OR MINTS.     Take these medicines the morning of surgery with A SIP OF WATER:  Gabapentin,  Levothyroxine, Alprazolam if needed,  Bring Albuterol inhaler with you day of surgery                                    You may not have any metal on your body including hair pins and piercings               Do not wear jewelry, make-up, lotions, powders or perfumes, deodorant              Do not wear nail polish.  Do not shave  48 hours prior to surgery.                 Do not bring valuables to the hospital. [place].  Contacts, dentures or bridgework may not be worn into surgery.  Leave suitcase in the car. After surgery it may be brought to your room.  ____________________________________________________________________________      CLEAR LIQUID DIET   Foods Allowed                                                                     Foods Excluded  Coffee and tea, regular and decaf                             liquids that you cannot  Plain Jell-O in any flavor                                             see through such as: Fruit ices (not with fruit pulp)                                     milk, soups, orange  juice  Iced Popsicles                                     All solid food Carbonated beverages, regular and diet                                    Cranberry, grape and apple juices Sports drinks like Gatorade Lightly seasoned clear broth or consume(fat free) Sugar, honey syrup  Sample Menu Breakfast                                Lunch                                     Supper Cranberry juice                    Beef broth                            Chicken broth Jell-O                                     Grape juice                           Apple juice Coffee or tea                        Jell-O                                      Popsicle                                                Coffee or tea                        Coffee or tea  _____________________________________________________________________  How to Manage Your Diabetes Before and After Surgery  Why is it important to control my blood sugar before and after surgery? . Improving blood sugar levels before and after surgery helps healing and can limit problems. . A way of improving blood sugar control is eating a healthy diet by: o  Eating less sugar and carbohydrates o  Increasing activity/exercise o  Talking with your doctor about reaching your blood sugar goals . High blood sugars (greater than 180 mg/dL) can raise your risk of infections and slow your recovery, so you will need to focus on controlling your diabetes during the weeks before surgery. . Make sure that the doctor who takes care of your diabetes knows about your planned surgery including the date and location.  How do I manage my blood sugar before surgery? . Check your blood sugar at least 4 times a day, starting 2 days before surgery, to make sure that the level  is not too high or low. o Check your blood sugar the morning of your surgery when you wake up and every 2 hours until you get to the Short Stay unit. . If your blood sugar is less than 70 mg/dL, you will need to treat for low blood sugar: o Do not  take insulin. o Treat a low blood sugar (less than 70 mg/dL) with  cup of clear juice (cranberry or apple), 4 glucose tablets, OR glucose gel. o Recheck blood sugar in 15 minutes after treatment (to make sure it is greater than 70 mg/dL). If your blood sugar is not greater than 70 mg/dL on recheck, call [phone] for further instructions. . Report your blood sugar to the short stay nurse when you get to Short Stay.  . If you are admitted to the hospital after surgery: o Your blood sugar will be checked by the staff and you will probably be given insulin after surgery (instead of oral diabetes medicines) to make sure you have good blood sugar levels. o The goal for blood sugar control after surgery is 80-180 mg/dL.   WHAT DO I DO ABOUT MY DIABETES MEDICATION?  Marland Kitchen Do not take oral diabetes medicines (pills) the morning of surgery.  . THE NIGHT BEFORE SURGERY, take    12.5  units of    Lantus   insulin.       . THE MORNING OF SURGERY,  Do not do any  insulin  . If your CBG is greater than 220 mg/dL, you may take  of your sliding scale  . (correction) dose of insulin.   Patient Signature:  Date:   Nurse Signature:  Date:   Reviewed and Endorsed by [facility] Patient Education Committee, August 2015           [facility] - Preparing for Surgery Before surgery, you can play an important role.  Because skin is not sterile, your skin needs to be as free of germs as possible.  You can reduce the number of germs on your skin by washing with CHG (chlorahexidine gluconate) soap before surgery.  CHG is an antiseptic cleaner which kills germs and bonds with the skin to continue killing germs even after washing. Please DO NOT use if you have an allergy to CHG or antibacterial soaps.  If your skin becomes reddened/irritated stop using the CHG and inform your nurse when you arrive at Short Stay. Do not shave (including legs and underarms) for at least 48 hours prior to the first CHG shower.  You  may shave your face/neck. Please follow these instructions carefully:  1.  Shower with CHG Soap the night before surgery and the  morning of Surgery.  2.  If you choose to wash your hair, wash your hair first as usual with your  normal  shampoo.  3.  After you shampoo, rinse your hair and body thoroughly to remove the  shampoo.                            4.  Use CHG as you would any other liquid soap.  You can apply chg directly  to the skin and wash                       Gently with a scrungie or clean washcloth.  5.  Apply the CHG Soap to your body ONLY FROM THE NECK DOWN.   Do not  use on face/ open                           Wound or open sores. Avoid contact with eyes, ears mouth and genitals (private parts).                       Wash face,  Genitals (private parts) with your normal soap.             6.  Wash thoroughly, paying special attention to the area where your surgery  will be performed.  7.  Thoroughly rinse your body with warm water from the neck down.  8.  DO NOT shower/wash with your normal soap after using and rinsing off  the CHG Soap.             9.  Pat yourself dry with a clean towel.            10.  Wear clean pajamas.            11.  Place clean sheets on your bed the night of your first shower and do not  sleep with pets. Day of Surgery : Do not apply any lotions/deodorants the morning of surgery.  Please wear clean clothes to the hospital/surgery center.

## 2018-10-19 NOTE — H&P (Signed)
Chief Complaint:  Recurrent panniculitis  History of Present Illness:  Amy Adams is an 52 y.o. female who has IDDM and has lost a lot of weight and has developed recurrent panniculitis.  Informed consent regarding panniculectomy including the umbilicus.  She is aware of the risks and benefits of the procedure  Past Medical History:  Diagnosis Date  . Anxiety   . Arthritis   . Diabetes mellitus without complication ([place])   . History of migraine headaches   . Hyperlipidemia   . Hypertension   . Thyroid disease     Past Surgical History:  Procedure Laterality Date  . ABDOMINAL HYSTERECTOMY    . CARPAL TUNNEL RELEASE    . CESAREAN SECTION    . ENDOSCOPIC PLANTAR FASCIOTOMY Left   . HAMMER TOE SURGERY Left   . METATARSAL OSTEOTOMY Left   . phenol matrixectomy Right    1st toenail  . TONSILLECTOMY      No current facility-administered medications for this encounter.    Current Outpatient Medications  Medication Sig Dispense Refill  . albuterol (PROVENTIL HFA;VENTOLIN HFA) 108 (90 BASE) MCG/ACT inhaler Inhale 1-2 puffs into the lungs every 6 (six) hours as needed for wheezing or shortness of breath.    . ALPRAZolam (XANAX) 1 MG tablet Take 1 mg by mouth 3 (three) times daily as needed for anxiety.     . cyclobenzaprine (FLEXERIL) 10 MG tablet Take 1 tablet (10 mg total) by mouth 3 (three) times daily. (Patient taking differently: Take 10 mg by mouth 3 (three) times daily as needed for muscle spasms. ) 90 tablet 3  . DULoxetine (CYMBALTA) 60 MG capsule Take 120 mg by mouth at bedtime.    Marland Kitchen estradiol (ESTRACE) 0.5 MG tablet Take 0.5 mg by mouth daily.    . fenofibrate micronized (LOFIBRA) 134 MG capsule Take 134 mg by mouth daily.    . folic acid (FOLVITE) 1 MG tablet Take 2 mg by mouth daily.     Marland Kitchen gabapentin (NEURONTIN) 400 MG capsule TAKE 1 CAPSULE BY MOUTH 3 TIMES DAILY. (Patient taking differently: Take 400 mg by mouth 3 (three) times daily. ) 270 capsule 3  .  hydrochlorothiazide (HYDRODIURIL) 25 MG tablet Take 25 mg by mouth daily.    . hydroxychloroquine (PLAQUENIL) 200 MG tablet Take 400 mg by mouth at bedtime.     . insulin glargine (LANTUS SOLOSTAR) 100 UNIT/ML injection Inject 25 Units into the skin 2 (two) times daily.     . insulin lispro (HUMALOG KWIKPEN) 100 UNIT/ML injection Inject 13 Units into the skin 3 (three) times daily with meals. 12-15 units sliding scale    . levocetirizine (XYZAL) 5 MG tablet Take 5 mg by mouth every evening.    Marland Kitchen levothyroxine (SYNTHROID, LEVOTHROID) 50 MCG tablet Take 50 mcg by mouth daily before breakfast.     . losartan (COZAAR) 100 MG tablet Take 100 mg by mouth daily.    . metFORMIN (GLUCOPHAGE-XR) 500 MG 24 hr tablet Take 1,000 mg by mouth at bedtime.     . methotrexate 25 MG/ML SOLN Inject 20 mg into the muscle every Thursday. Inject 0.8 mL once weekly at bedtime. Every Thursday    . montelukast (SINGULAIR) 10 MG tablet Take 10 mg by mouth at bedtime.    . mupirocin ointment (BACTROBAN) 2 % Apply 1 application topically 3 (three) times daily.   1  . nystatin-triamcinolone ointment (MYCOLOG) Apply 1 application topically 2 (two) times daily.     Marland Kitchen omeprazole (  PRILOSEC) 40 MG capsule Take 40 mg by mouth daily.     . traMADol (ULTRAM) 50 MG tablet Take 1 tablet (50 mg total) by mouth every 8 (eight) hours as needed. (Patient taking differently: Take 50 mg by mouth at bedtime. ) 30 tablet 0  . valACYclovir (VALTREX) 1000 MG tablet Take 1,000 mg by mouth 2 (two) times daily as needed (fever blisters).   3  . zolpidem (AMBIEN CR) 12.5 MG CR tablet Take 12.5 mg by mouth at bedtime.     . Continuous Blood Gluc Sensor (FREESTYLE LIBRE 14 DAY SENSOR) MISC USE AS DIRECTED EVERY 2 WEEKS SUBCUTANEOUSLY  4  . ibuprofen (ADVIL,MOTRIN) 800 MG tablet Take 1 tablet (800 mg total) by mouth every 8 (eight) hours as needed. (Patient not taking: Reported on 10/16/2018) 30 tablet 0  . UNIFINE PENTIPS 31G X 5 MM MISC USE AS DIRECTED  5 TIMES PER DAY  4   Dilaudid [hydromorphone hcl]; Doxycycline; Topiramate; and Trazodone and nefazodone Family History  Problem Relation Age of Onset  . Diabetes Mother   . Migraines Mother   . Diabetes Sister    Social History:   reports that she has never smoked. She has never used smokeless tobacco. She reports that she does not drink alcohol or use drugs.   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 There is no height or weight on file to calculate BMI.  Gen:  WDWN WF NAD  Neurological: Alert and oriented to person, place, and time. Motor and sensory function is grossly intact  Head: Normocephalic and atraumatic.  Eyes: Conjunctivae are normal. Pupils are equal, round, and reactive to light. No scleral icterus.  Neck: Normal range of motion. Neck supple. No tracheal deviation or thyromegaly present.  Cardiovascular:  SR without murmurs or gallops.  No carotid bruits Breast:  Not examined Respiratory: Effort normal.  No respiratory distress. No chest wall tenderness. Breath sounds normal.  No wheezes, rales or rhonchi.  Abdomen:  Large redundant abdominal pannus with inferior skin changes GU:  Prior sling Musculoskeletal: Normal range of motion. Extremities are nontender. No cyanosis, edema or clubbing noted Lymphadenopathy: No cervical, preauricular, postauricular or axillary adenopathy is present Skin: Skin is warm and dry. No rash noted. No diaphoresis. No erythema. No pallor. Pscyh: Normal mood and affect. Behavior is normal. Judgment and thought content normal.   LABORATORY RESULTS: No results found for this or any previous visit (from the past 48 hour(s)).   RADIOLOGY RESULTS: No results found.  Problem List: Patient Active Problem List   Diagnosis Date Noted  . Panniculitis 09/04/2018  . HYPOTHYROIDISM 12/01/2010  . Type 2  diabetes mellitus with diabetic polyneuropathy, with long-term current use of insulin ([place]) 12/01/2010  . 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 OTHER AND UNSPECIFIED HYPERLIPIDEMIA 12/01/2010  . OBESITY, UNSPECIFIED 12/01/2010  . ESSENTIAL HYPERTENSION, BENIGN 12/01/2010  . Rheumatoid arthritis ([place]) 12/01/2010    Assessment & Plan: Abdominal panniculitis after losing a lot of weight on her own.    Abdominal panniculectomy    Matt B. Hassell Done, MD, [facility] [phone] beeper [phone]  10/19/2018 1:47 PM

## 2018-10-22 ENCOUNTER — Encounter (HOSPITAL_COMMUNITY): Payer: Self-pay

## 2018-10-22 ENCOUNTER — Encounter (HOSPITAL_COMMUNITY)
Admission: RE | Admit: 2018-10-22 | Discharge: 2018-10-22 | Disposition: A | Discharge status: Home / Self Care | Payer: 59 | Source: Ambulatory Visit | Attending: Surgery | Admitting: Surgery

## 2018-10-22 ENCOUNTER — Other Ambulatory Visit: Payer: Self-pay

## 2018-10-22 DIAGNOSIS — Z01818 Encounter for other preprocedural examination: Secondary | ICD-10-CM | POA: Diagnosis not present

## 2018-10-22 HISTORY — DX: Stress incontinence (female) (male): N39.3

## 2018-10-22 HISTORY — DX: Gastro-esophageal reflux disease without esophagitis: K21.9

## 2018-10-22 HISTORY — DX: Hypothyroidism, unspecified: E03.9

## 2018-10-22 HISTORY — DX: Type 2 diabetes mellitus without complications: E11.9

## 2018-10-22 HISTORY — DX: Presence of spectacles and contact lenses: Z97.3

## 2018-10-22 HISTORY — DX: Migraine, unspecified, not intractable, without status migrainosus: G43.909

## 2018-10-22 HISTORY — DX: Pain in left foot: M79.672

## 2018-10-22 HISTORY — DX: Polyneuropathy, unspecified: G62.9

## 2018-10-22 HISTORY — DX: Personal history of other diseases of the digestive system: Z87.19

## 2018-10-22 HISTORY — DX: Other chronic pain: G89.29

## 2018-10-22 HISTORY — DX: Rheumatoid arthritis, unspecified: M06.9

## 2018-10-22 HISTORY — DX: Long term (current) use of insulin: Z79.4

## 2018-10-22 LAB — CBC
HEMATOCRIT: 42.1 % (ref 36.0–46.0)
Hemoglobin: 14.1 g/dL (ref 12.0–15.0)
MCH: 29 pg (ref 26.0–34.0)
MCHC: 33.5 g/dL (ref 30.0–36.0)
MCV: 86.4 fL (ref 80.0–100.0)
Platelets: 243 10*3/uL (ref 150–400)
RBC: 4.87 MIL/uL (ref 3.87–5.11)
RDW: 12.8 % (ref 11.5–15.5)
WBC: 7.9 10*3/uL (ref 4.0–10.5)
nRBC: 0 % (ref 0.0–0.2)

## 2018-10-22 LAB — BASIC METABOLIC PANEL
Anion gap: 11 (ref 5–15)
BUN: 16 mg/dL (ref 6–20)
CO2: 28 mmol/L (ref 22–32)
Calcium: 9.3 mg/dL (ref 8.9–10.3)
Chloride: 99 mmol/L (ref 98–111)
Creatinine, Ser: 0.86 mg/dL (ref 0.44–1.00)
GFR calc Af Amer: >60 mL/min (ref >60)
GFR calc non Af Amer: >60 mL/min (ref >60)
GLUCOSE: 122 mg/dL — AB (ref 70–99)
Potassium: 4.1 mmol/L (ref 3.5–5.1)
Sodium: 138 mmol/L (ref 135–145)

## 2018-10-22 LAB — HEMOGLOBIN A1C
Hgb A1c MFr Bld: 6.5 % — ABNORMAL HIGH (ref 4.8–5.6)
Mean Plasma Glucose: 139.85 mg/dL

## 2018-10-22 LAB — GLUCOSE, CAPILLARY: Glucose-Capillary: 131 mg/dL — ABNORMAL HIGH (ref 70–99)

## 2018-10-22 MED ORDER — CHLORHEXIDINE GLUCONATE CLOTH 2 % EX PADS
6.0000 | MEDICATED_PAD | Freq: Once | CUTANEOUS | Status: DC
  Filled 2018-10-22: qty 6

## 2018-10-22 NOTE — Progress Notes (Signed)
Final EKG dated 10-22-2018 in epic. 

## 2018-10-23 DIAGNOSIS — E669 Obesity, unspecified: Secondary | ICD-10-CM | POA: Diagnosis not present

## 2018-10-23 DIAGNOSIS — L659 Nonscarring hair loss, unspecified: Secondary | ICD-10-CM | POA: Diagnosis not present

## 2018-10-23 DIAGNOSIS — M0589 Other rheumatoid arthritis with rheumatoid factor of multiple sites: Secondary | ICD-10-CM | POA: Diagnosis not present

## 2018-10-23 DIAGNOSIS — M2550 Pain in unspecified joint: Secondary | ICD-10-CM | POA: Diagnosis not present

## 2018-10-23 DIAGNOSIS — M5136 Other intervertebral disc degeneration, lumbar region: Secondary | ICD-10-CM | POA: Diagnosis not present

## 2018-10-23 DIAGNOSIS — Z6830 Body mass index (BMI) 30.0-30.9, adult: Secondary | ICD-10-CM | POA: Diagnosis not present

## 2018-10-23 DIAGNOSIS — M150 Primary generalized (osteo)arthritis: Secondary | ICD-10-CM | POA: Diagnosis not present

## 2018-10-23 DIAGNOSIS — Z79899 Other long term (current) drug therapy: Secondary | ICD-10-CM | POA: Diagnosis not present

## 2018-10-23 DIAGNOSIS — M25512 Pain in left shoulder: Secondary | ICD-10-CM | POA: Diagnosis not present

## 2018-10-23 MED FILL — HYDROXYCHLOROQUINE SULFATE: 200 | 90 days supply | Qty: 180 | Fill #1

## 2018-10-23 MED FILL — UNIFINE PENTIPS 31GX3/16: 31G X 5 MM | 80 days supply | Qty: 400 | Fill #1

## 2018-10-23 MED FILL — OMEPRAZOLE 40 MG CPDR: 40 | 90 days supply | Qty: 180 | Fill #0

## 2018-10-23 MED FILL — valACYclovir HCL 1 GM TABS: 1 | 3 days supply | Qty: 12 | Fill #1

## 2018-10-23 MED FILL — DULoxetine HCL 60 MG CPEP: 60 | 90 days supply | Qty: 180 | Fill #0

## 2018-10-23 MED FILL — FOLIC ACID 1 MG TABS: 1 | 90 days supply | Qty: 180 | Fill #0

## 2018-10-23 MED FILL — HYDROCHLOROTHIAZIDE 25 MG T: 25 | 90 days supply | Qty: 90 | Fill #3

## 2018-10-23 MED FILL — LEVOTHYROXINE 50 MCG TABLET: 50 | 90 days supply | Qty: 90 | Fill #1

## 2018-10-23 MED FILL — MONTELUKAST SOD 10 MG TAB: 10 | 90 days supply | Qty: 90 | Fill #0

## 2018-10-23 MED FILL — FREESTYLE LIBRE 14 DAY SENS: 84 days supply | Qty: 6 | Fill #2

## 2018-10-23 MED FILL — ZOLPIDEM TART ER 12.5 MG TA: 12.5 | 90 days supply | Qty: 90 | Fill #0

## 2018-10-23 MED FILL — LEVOCETIRIZINE 5 MG TABLET: 5 | 90 days supply | Qty: 90 | Fill #0

## 2018-10-23 MED FILL — metFORMIN HCL ER 500 MG TB2: 500 | 90 days supply | Qty: 360 | Fill #2

## 2018-10-23 MED FILL — LANTUS SOLOSTAR 100 UNITS/M: 100 | 84 days supply | Qty: 105 | Fill #2

## 2018-10-23 MED FILL — CYCLOBENZAPRINE 10 MG TAB: 10 | 30 days supply | Qty: 90 | Fill #2

## 2018-10-23 MED FILL — UNIFINE PENTIPS 31GX3/16": 31G X 5 MM | 80 days supply | Qty: 400 | Fill #1

## 2018-10-24 MED FILL — HUMALOG 100 UNITS/ML KWIKPE: 100 | 45 days supply | Qty: 45 | Fill #3

## 2018-10-25 MED FILL — PROAIR HFA 90 MCG INHALER: 108 (90 BAS | 75 days supply | Qty: 26 | Fill #0

## 2018-10-25 NOTE — Anesthesia Preprocedure Evaluation (Addendum)
Anesthesia Evaluation  Patient identified by MRN, date of birth, ID band Patient awake    Reviewed: Allergy & Precautions, NPO status , Patient's Chart, lab work & pertinent test results  History of Anesthesia Complications Negative for: history of anesthetic complications  Airway Mallampati: II  TM Distance: >3 FB Neck ROM: Full    Dental  (+) Teeth Intact, Dental Advisory Given   Pulmonary neg pulmonary ROS,    Pulmonary exam normal breath sounds clear to auscultation       Cardiovascular hypertension, Pt. on medications Normal cardiovascular exam Rhythm:Regular Rate:Normal     Neuro/Psych  Headaches, Anxiety    GI/Hepatic Neg liver ROS, GERD  Medicated and Controlled,  Endo/Other  diabetes, Type 2, Insulin Dependent, Oral Hypoglycemic AgentsHypothyroidism   Renal/GU negative Renal ROS     Musculoskeletal  (+) Arthritis , Rheumatoid disorders,    Abdominal   Peds  Hematology negative hematology ROS (+)   Anesthesia Other Findings Day of surgery medications reviewed with the patient.  Reproductive/Obstetrics                            Anesthesia Physical Anesthesia Plan  ASA: II  Anesthesia Plan: General   Post-op Pain Management:    Induction: Intravenous  PONV Risk Score and Plan: Treatment may vary due to age or medical condition, Ondansetron, Dexamethasone and Midazolam  Airway Management Planned: Oral ETT  Additional Equipment:   Intra-op Plan:   Post-operative Plan: Extubation in OR  Informed Consent: I have reviewed the patients History and Physical, chart, labs and discussed the procedure including the risks, benefits and alternatives for the proposed anesthesia with the patient or authorized representative who has indicated his/her understanding and acceptance.   Dental advisory given  Plan Discussed with: CRNA  Anesthesia Plan Comments:        Anesthesia  Quick Evaluation

## 2018-10-26 ENCOUNTER — Ambulatory Visit (HOSPITAL_COMMUNITY): Payer: 59 | Admitting: Anesthesiology

## 2018-10-26 ENCOUNTER — Observation Stay (HOSPITAL_COMMUNITY)
Admission: RE | Admit: 2018-10-26 | Discharge: 2018-10-27 | Disposition: A | Discharge status: Home / Self Care | Payer: 59 | Attending: Surgery | Admitting: Surgery

## 2018-10-26 ENCOUNTER — Encounter (HOSPITAL_COMMUNITY)
Admission: RE | Disposition: A | Discharge status: Home / Self Care | Payer: Self-pay | Source: Home / Self Care | Attending: Surgery

## 2018-10-26 ENCOUNTER — Encounter (HOSPITAL_COMMUNITY): Payer: Self-pay | Admitting: *Deleted

## 2018-10-26 ENCOUNTER — Other Ambulatory Visit: Payer: Self-pay

## 2018-10-26 DIAGNOSIS — E039 Hypothyroidism, unspecified: Secondary | ICD-10-CM | POA: Insufficient documentation

## 2018-10-26 DIAGNOSIS — Z6831 Body mass index (BMI) 31.0-31.9, adult: Secondary | ICD-10-CM | POA: Insufficient documentation

## 2018-10-26 DIAGNOSIS — Z881 Allergy status to other antibiotic agents status: Secondary | ICD-10-CM | POA: Insufficient documentation

## 2018-10-26 DIAGNOSIS — K219 Gastro-esophageal reflux disease without esophagitis: Secondary | ICD-10-CM | POA: Insufficient documentation

## 2018-10-26 DIAGNOSIS — Z794 Long term (current) use of insulin: Secondary | ICD-10-CM | POA: Insufficient documentation

## 2018-10-26 DIAGNOSIS — Z885 Allergy status to narcotic agent status: Secondary | ICD-10-CM | POA: Insufficient documentation

## 2018-10-26 DIAGNOSIS — I10 Essential (primary) hypertension: Secondary | ICD-10-CM | POA: Diagnosis not present

## 2018-10-26 DIAGNOSIS — Z9889 Other specified postprocedural states: Secondary | ICD-10-CM

## 2018-10-26 DIAGNOSIS — M793 Panniculitis, unspecified: Secondary | ICD-10-CM | POA: Diagnosis not present

## 2018-10-26 DIAGNOSIS — E65 Localized adiposity: Secondary | ICD-10-CM | POA: Diagnosis not present

## 2018-10-26 DIAGNOSIS — Z79899 Other long term (current) drug therapy: Secondary | ICD-10-CM | POA: Diagnosis not present

## 2018-10-26 DIAGNOSIS — Z79890 Hormone replacement therapy (postmenopausal): Secondary | ICD-10-CM | POA: Diagnosis not present

## 2018-10-26 DIAGNOSIS — Z89429 Acquired absence of other toe(s), unspecified side: Secondary | ICD-10-CM | POA: Diagnosis not present

## 2018-10-26 DIAGNOSIS — E7849 Other hyperlipidemia: Secondary | ICD-10-CM | POA: Diagnosis not present

## 2018-10-26 DIAGNOSIS — M069 Rheumatoid arthritis, unspecified: Secondary | ICD-10-CM | POA: Diagnosis not present

## 2018-10-26 DIAGNOSIS — E669 Obesity, unspecified: Secondary | ICD-10-CM | POA: Insufficient documentation

## 2018-10-26 DIAGNOSIS — E1142 Type 2 diabetes mellitus with diabetic polyneuropathy: Secondary | ICD-10-CM | POA: Diagnosis not present

## 2018-10-26 DIAGNOSIS — F419 Anxiety disorder, unspecified: Secondary | ICD-10-CM | POA: Diagnosis not present

## 2018-10-26 DIAGNOSIS — E785 Hyperlipidemia, unspecified: Secondary | ICD-10-CM | POA: Diagnosis not present

## 2018-10-26 HISTORY — PX: PANNICULECTOMY: SHX5360

## 2018-10-26 LAB — GLUCOSE, CAPILLARY
GLUCOSE-CAPILLARY: 75 mg/dL (ref 70–99)
GLUCOSE-CAPILLARY: 221 mg/dL — AB (ref 70–99)
GLUCOSE-CAPILLARY: 227 mg/dL — AB (ref 70–99)
Glucose-Capillary: 137 mg/dL — ABNORMAL HIGH (ref 70–99)
Glucose-Capillary: 153 mg/dL — ABNORMAL HIGH (ref 70–99)
Glucose-Capillary: 172 mg/dL — ABNORMAL HIGH (ref 70–99)
Glucose-Capillary: 228 mg/dL — ABNORMAL HIGH (ref 70–99)
Glucose-Capillary: 296 mg/dL — ABNORMAL HIGH (ref 70–99)

## 2018-10-26 LAB — CREATININE, SERUM
Creatinine, Ser: 1.26 mg/dL — ABNORMAL HIGH (ref 0.44–1.00)
GFR calc Af Amer: 56 mL/min — ABNORMAL LOW (ref >60)
GFR calc non Af Amer: 48 mL/min — ABNORMAL LOW (ref >60)

## 2018-10-26 LAB — CBC
HCT: 39.4 % (ref 36.0–46.0)
Hemoglobin: 13.1 g/dL (ref 12.0–15.0)
MCH: 28.4 pg (ref 26.0–34.0)
MCHC: 33.2 g/dL (ref 30.0–36.0)
MCV: 85.3 fL (ref 80.0–100.0)
NRBC: 0 % (ref 0.0–0.2)
Platelets: 212 10*3/uL (ref 150–400)
RBC: 4.62 MIL/uL (ref 3.87–5.11)
RDW: 13.1 % (ref 11.5–15.5)
WBC: 10.5 10*3/uL (ref 4.0–10.5)

## 2018-10-26 SURGERY — PANNICULECTOMY
Anesthesia: General

## 2018-10-26 MED ORDER — SUGAMMADEX SODIUM 200 MG/2ML IV SOLN
INTRAVENOUS | Status: DC | PRN
  Administered 2018-10-26: 200 mg via INTRAVENOUS

## 2018-10-26 MED ORDER — OXYCODONE HCL 5 MG/5ML PO SOLN
5.0000 mg | Freq: Once | ORAL | Status: DC | PRN
  Filled 2018-10-26: qty 5

## 2018-10-26 MED ORDER — LEVOTHYROXINE SODIUM 50 MCG PO TABS
50.0000 ug | ORAL_TABLET | Freq: Every day | ORAL | Status: DC
  Administered 2018-10-27: 50 ug via ORAL
  Filled 2018-10-26: qty 1

## 2018-10-26 MED ORDER — HYDROXYCHLOROQUINE SULFATE 200 MG PO TABS
400.0000 mg | ORAL_TABLET | Freq: Every day | ORAL | Status: DC
  Administered 2018-10-26: 400 mg via ORAL
  Filled 2018-10-26: qty 2

## 2018-10-26 MED ORDER — LOSARTAN POTASSIUM 50 MG PO TABS
100.0000 mg | ORAL_TABLET | Freq: Every day | ORAL | Status: DC
  Administered 2018-10-26 – 2018-10-27 (×2): 100 mg via ORAL
  Filled 2018-10-26 (×2): qty 2

## 2018-10-26 MED ORDER — PHENYLEPHRINE 40 MCG/ML (10ML) SYRINGE FOR IV PUSH (FOR BLOOD PRESSURE SUPPORT)
PREFILLED_SYRINGE | INTRAVENOUS | Status: DC | PRN
  Administered 2018-10-26 (×4): 80 ug via INTRAVENOUS

## 2018-10-26 MED ORDER — EPHEDRINE SULFATE-NACL 50-0.9 MG/10ML-% IV SOSY
PREFILLED_SYRINGE | INTRAVENOUS | Status: DC | PRN
  Administered 2018-10-26: 5 mg via INTRAVENOUS
  Administered 2018-10-26: 10 mg via INTRAVENOUS
  Administered 2018-10-26: 5 mg via INTRAVENOUS
  Administered 2018-10-26 (×2): 10 mg via INTRAVENOUS

## 2018-10-26 MED ORDER — FENTANYL CITRATE (PF) 100 MCG/2ML IJ SOLN
INTRAMUSCULAR | Status: AC
  Filled 2018-10-26: qty 2

## 2018-10-26 MED ORDER — INSULIN ASPART 100 UNIT/ML ~~LOC~~ SOLN
SUBCUTANEOUS | Status: AC
  Filled 2018-10-26: qty 1

## 2018-10-26 MED ORDER — ONDANSETRON HCL 4 MG/2ML IJ SOLN
INTRAMUSCULAR | Status: AC
  Filled 2018-10-26: qty 2

## 2018-10-26 MED ORDER — DEXAMETHASONE SODIUM PHOSPHATE 10 MG/ML IJ SOLN
INTRAMUSCULAR | Status: DC | PRN
  Administered 2018-10-26: 7 mg via INTRAVENOUS

## 2018-10-26 MED ORDER — ESTRADIOL 1 MG PO TABS
0.5000 mg | ORAL_TABLET | Freq: Every evening | ORAL | Status: DC
  Filled 2018-10-26 (×2): qty 0.5

## 2018-10-26 MED ORDER — GABAPENTIN 400 MG PO CAPS: 400.0000 mg | ORAL_CAPSULE | Freq: Three times a day (TID) | ORAL | Status: DC

## 2018-10-26 MED ORDER — SUCCINYLCHOLINE CHLORIDE 200 MG/10ML IV SOSY
PREFILLED_SYRINGE | INTRAVENOUS | Status: AC
  Filled 2018-10-26: qty 10

## 2018-10-26 MED ORDER — ALPRAZOLAM 1 MG PO TABS: 1.0000 mg | ORAL_TABLET | Freq: Three times a day (TID) | ORAL | Status: DC | PRN

## 2018-10-26 MED ORDER — LIDOCAINE 2% (20 MG/ML) 5 ML SYRINGE
INTRAMUSCULAR | Status: DC | PRN
  Administered 2018-10-26: 1.5 mg/kg/h via INTRAVENOUS

## 2018-10-26 MED ORDER — FENTANYL CITRATE (PF) 250 MCG/5ML IJ SOLN
INTRAMUSCULAR | Status: AC
  Filled 2018-10-26: qty 5

## 2018-10-26 MED ORDER — KETAMINE HCL 10 MG/ML IJ SOLN
INTRAMUSCULAR | Status: DC | PRN
  Administered 2018-10-26: 40 mg via INTRAVENOUS

## 2018-10-26 MED ORDER — MORPHINE SULFATE (PF) 4 MG/ML IV SOLN
INTRAVENOUS | Status: AC
  Filled 2018-10-26: qty 1

## 2018-10-26 MED ORDER — HYDRALAZINE HCL 20 MG/ML IJ SOLN: 10.0000 mg | INTRAMUSCULAR | Status: DC | PRN

## 2018-10-26 MED ORDER — LIDOCAINE 2% (20 MG/ML) 5 ML SYRINGE
INTRAMUSCULAR | Status: AC
  Filled 2018-10-26: qty 5

## 2018-10-26 MED ORDER — ROCURONIUM BROMIDE 100 MG/10ML IV SOLN
INTRAVENOUS | Status: AC
  Filled 2018-10-26: qty 1

## 2018-10-26 MED ORDER — MORPHINE SULFATE (PF) 4 MG/ML IV SOLN
1.0000 mg | INTRAVENOUS | Status: DC | PRN
  Administered 2018-10-26 – 2018-10-27 (×4): 1 mg via INTRAVENOUS
  Filled 2018-10-26 (×3): qty 1

## 2018-10-26 MED ORDER — ROCURONIUM BROMIDE 50 MG/5ML IV SOSY
PREFILLED_SYRINGE | INTRAVENOUS | Status: DC | PRN
  Administered 2018-10-26 (×2): 10 mg via INTRAVENOUS
  Administered 2018-10-26: 60 mg via INTRAVENOUS

## 2018-10-26 MED ORDER — TRAMADOL HCL 50 MG PO TABS
50.0000 mg | ORAL_TABLET | Freq: Every day | ORAL | Status: DC
  Administered 2018-10-26: 50 mg via ORAL
  Filled 2018-10-26: qty 1

## 2018-10-26 MED ORDER — FENTANYL CITRATE (PF) 250 MCG/5ML IJ SOLN
INTRAMUSCULAR | Status: DC | PRN
  Administered 2018-10-26: 50 ug via INTRAVENOUS
  Administered 2018-10-26: 100 ug via INTRAVENOUS
  Administered 2018-10-26 (×2): 50 ug via INTRAVENOUS

## 2018-10-26 MED ORDER — SODIUM CHLORIDE 0.9 % IV SOLN
2.0000 g | Freq: Two times a day (BID) | INTRAVENOUS | Status: AC
  Administered 2018-10-26: 2 g via INTRAVENOUS
  Filled 2018-10-26: qty 2

## 2018-10-26 MED ORDER — LIDOCAINE 2% (20 MG/ML) 5 ML SYRINGE
INTRAMUSCULAR | Status: DC | PRN
  Administered 2018-10-26: 40 mg via INTRAVENOUS

## 2018-10-26 MED ORDER — DULOXETINE HCL 60 MG PO CPEP
120.0000 mg | ORAL_CAPSULE | Freq: Every day | ORAL | Status: DC
  Administered 2018-10-26: 120 mg via ORAL
  Filled 2018-10-26: qty 2

## 2018-10-26 MED ORDER — SODIUM CHLORIDE 0.9 % IV SOLN
2.0000 g | INTRAVENOUS | Status: AC
  Administered 2018-10-26: 2 g via INTRAVENOUS
  Filled 2018-10-26: qty 2

## 2018-10-26 MED ORDER — PROMETHAZINE HCL 25 MG/ML IJ SOLN: 6.2500 mg | INTRAMUSCULAR | Status: DC | PRN

## 2018-10-26 MED ORDER — FLEET ENEMA 7-19 GM/118ML RE ENEM
1.0000 | ENEMA | Freq: Once | RECTAL | Status: DC
  Filled 2018-10-26: qty 1

## 2018-10-26 MED ORDER — LACTATED RINGERS IV SOLN
INTRAVENOUS | Status: DC
  Administered 2018-10-26: 08:00:00 via INTRAVENOUS

## 2018-10-26 MED ORDER — ACETAMINOPHEN 500 MG PO TABS
1000.0000 mg | ORAL_TABLET | ORAL | Status: AC
  Administered 2018-10-26: 1000 mg via ORAL
  Filled 2018-10-26: qty 2

## 2018-10-26 MED ORDER — GABAPENTIN 400 MG PO CAPS
800.0000 mg | ORAL_CAPSULE | Freq: Every day | ORAL | Status: DC
  Administered 2018-10-26: 800 mg via ORAL
  Filled 2018-10-26: qty 2

## 2018-10-26 MED ORDER — HEPARIN SODIUM (PORCINE) 5000 UNIT/ML IJ SOLN
5000.0000 [IU] | Freq: Once | INTRAMUSCULAR | Status: AC
  Administered 2018-10-26: 5000 [IU] via SUBCUTANEOUS
  Filled 2018-10-26: qty 1

## 2018-10-26 MED ORDER — ACETAMINOPHEN 10 MG/ML IV SOLN
1000.0000 mg | Freq: Once | INTRAVENOUS | Status: DC | PRN
  Administered 2018-10-26: 1000 mg via INTRAVENOUS

## 2018-10-26 MED ORDER — GABAPENTIN 400 MG PO CAPS
400.0000 mg | ORAL_CAPSULE | Freq: Every morning | ORAL | Status: DC
  Administered 2018-10-27: 400 mg via ORAL
  Filled 2018-10-26: qty 1

## 2018-10-26 MED ORDER — LIP MEDEX EX OINT
TOPICAL_OINTMENT | CUTANEOUS | Status: AC
  Filled 2018-10-26: qty 7

## 2018-10-26 MED ORDER — HEPARIN SODIUM (PORCINE) 5000 UNIT/ML IJ SOLN
5000.0000 [IU] | Freq: Three times a day (TID) | INTRAMUSCULAR | Status: DC
  Administered 2018-10-26 – 2018-10-27 (×2): 5000 [IU] via SUBCUTANEOUS
  Filled 2018-10-26: qty 1

## 2018-10-26 MED ORDER — ISOPROPYL ALCOHOL 70 % SOLN
Status: AC
  Filled 2018-10-26: qty 480

## 2018-10-26 MED ORDER — ONDANSETRON HCL 4 MG/2ML IJ SOLN
INTRAMUSCULAR | Status: DC | PRN
  Administered 2018-10-26: 4 mg via INTRAVENOUS

## 2018-10-26 MED ORDER — HYDROCHLOROTHIAZIDE 25 MG PO TABS
25.0000 mg | ORAL_TABLET | Freq: Every day | ORAL | Status: DC
  Administered 2018-10-27: 25 mg via ORAL
  Filled 2018-10-26: qty 1

## 2018-10-26 MED ORDER — ONDANSETRON 4 MG PO TBDP: 4.0000 mg | ORAL_TABLET | Freq: Four times a day (QID) | ORAL | Status: DC | PRN

## 2018-10-26 MED ORDER — INSULIN ASPART 100 UNIT/ML ~~LOC~~ SOLN
0.0000 [IU] | SUBCUTANEOUS | Status: DC
  Administered 2018-10-26: 5 [IU] via SUBCUTANEOUS
  Administered 2018-10-26: 8 [IU] via SUBCUTANEOUS
  Administered 2018-10-27 (×2): 3 [IU] via SUBCUTANEOUS

## 2018-10-26 MED ORDER — MIDAZOLAM HCL 5 MG/5ML IJ SOLN
INTRAMUSCULAR | Status: DC | PRN
  Administered 2018-10-26: 2 mg via INTRAVENOUS

## 2018-10-26 MED ORDER — OXYCODONE HCL 5 MG PO TABS: 5.0000 mg | ORAL_TABLET | Freq: Once | ORAL | Status: DC | PRN

## 2018-10-26 MED ORDER — HEPARIN SODIUM (PORCINE) 5000 UNIT/ML IJ SOLN
INTRAMUSCULAR | Status: AC
  Filled 2018-10-26: qty 1

## 2018-10-26 MED ORDER — ACETAMINOPHEN 10 MG/ML IV SOLN
INTRAVENOUS | Status: AC
  Filled 2018-10-26: qty 100

## 2018-10-26 MED ORDER — GABAPENTIN 300 MG PO CAPS: 300.0000 mg | ORAL_CAPSULE | ORAL | Status: DC

## 2018-10-26 MED ORDER — ALBUTEROL SULFATE (2.5 MG/3ML) 0.083% IN NEBU: 2.5000 mg | INHALATION_SOLUTION | Freq: Four times a day (QID) | RESPIRATORY_TRACT | Status: DC | PRN

## 2018-10-26 MED ORDER — 0.9 % SODIUM CHLORIDE (POUR BTL) OPTIME
TOPICAL | Status: DC | PRN
  Administered 2018-10-26: 2000 mL

## 2018-10-26 MED ORDER — EPHEDRINE 5 MG/ML INJ
INTRAVENOUS | Status: AC
  Filled 2018-10-26: qty 10

## 2018-10-26 MED ORDER — KCL IN DEXTROSE-NACL 20-5-0.45 MEQ/L-%-% IV SOLN
INTRAVENOUS | Status: DC
  Administered 2018-10-26: 16:00:00 via INTRAVENOUS

## 2018-10-26 MED ORDER — FENTANYL CITRATE (PF) 100 MCG/2ML IJ SOLN
25.0000 ug | INTRAMUSCULAR | Status: DC | PRN
  Administered 2018-10-26 (×2): 50 ug via INTRAVENOUS

## 2018-10-26 MED ORDER — PHENYLEPHRINE 40 MCG/ML (10ML) SYRINGE FOR IV PUSH (FOR BLOOD PRESSURE SUPPORT)
PREFILLED_SYRINGE | INTRAVENOUS | Status: AC
  Filled 2018-10-26: qty 10

## 2018-10-26 MED ORDER — MIDAZOLAM HCL 2 MG/2ML IJ SOLN
INTRAMUSCULAR | Status: AC
  Filled 2018-10-26: qty 2

## 2018-10-26 MED ORDER — PANTOPRAZOLE SODIUM 40 MG IV SOLR
40.0000 mg | Freq: Every day | INTRAVENOUS | Status: DC
  Administered 2018-10-26: 40 mg via INTRAVENOUS
  Filled 2018-10-26: qty 40

## 2018-10-26 MED ORDER — KCL IN DEXTROSE-NACL 20-5-0.45 MEQ/L-%-% IV SOLN
INTRAVENOUS | Status: AC
  Filled 2018-10-26: qty 1000

## 2018-10-26 MED ORDER — PROPOFOL 10 MG/ML IV BOLUS
INTRAVENOUS | Status: AC
  Filled 2018-10-26: qty 20

## 2018-10-26 MED ORDER — DEXAMETHASONE SODIUM PHOSPHATE 10 MG/ML IJ SOLN
INTRAMUSCULAR | Status: AC
  Filled 2018-10-26: qty 1

## 2018-10-26 MED ORDER — ONDANSETRON HCL 4 MG/2ML IJ SOLN: 4.0000 mg | Freq: Four times a day (QID) | INTRAMUSCULAR | Status: DC | PRN

## 2018-10-26 MED ORDER — PROPOFOL 10 MG/ML IV BOLUS
INTRAVENOUS | Status: DC | PRN
  Administered 2018-10-26: 200 mg via INTRAVENOUS

## 2018-10-26 SURGICAL SUPPLY — 40 items
APL SWBSTK 6 STRL LF DISP (MISCELLANEOUS) ×1
APPLICATOR COTTON TIP 6 STRL (MISCELLANEOUS) ×1 IMPLANT
APPLICATOR COTTON TIP 6IN STRL (MISCELLANEOUS) ×2
BLADE EXTENDED COATED 6.5IN (ELECTRODE) IMPLANT
BLADE SURG SZ10 CARB STEEL (BLADE) ×6 IMPLANT
COVER MAYO STAND STRL (DRAPES) ×1 IMPLANT
COVER SURGICAL LIGHT HANDLE (MISCELLANEOUS) ×2 IMPLANT
COVER WAND RF STERILE (DRAPES) ×1 IMPLANT
DRAPE WARM FLUID 44X44 (DRAPE) ×2 IMPLANT
ELECT PENCIL ROCKER SW 15FT (MISCELLANEOUS) ×2 IMPLANT
ELECT REM PT RETURN 15FT ADLT (MISCELLANEOUS) ×2 IMPLANT
GAUZE SPONGE 4X4 12PLY STRL (GAUZE/BANDAGES/DRESSINGS) ×1 IMPLANT
GLOVE BIOGEL M 8.0 STRL (GLOVE) ×2 IMPLANT
GLOVE BIOGEL PI IND STRL 7.0 (GLOVE) ×1 IMPLANT
GLOVE BIOGEL PI IND STRL 7.5 (GLOVE) IMPLANT
GLOVE BIOGEL PI INDICATOR 7.0 (GLOVE) ×1
GLOVE BIOGEL PI INDICATOR 7.5 (GLOVE) ×3
GLOVE SURG SIGNA 7.5 PF LTX (GLOVE) ×1 IMPLANT
GLOVE SURG SS PI 7.5 STRL IVOR (GLOVE) ×1 IMPLANT
GOWN SPEC L4 XLG W/TWL (GOWN DISPOSABLE) ×2 IMPLANT
GOWN STRL REUS W/TWL XL LVL3 (GOWN DISPOSABLE) ×6 IMPLANT
HANDLE SUCTION POOLE (INSTRUMENTS) IMPLANT
KIT BASIN OR (CUSTOM PROCEDURE TRAY) ×2 IMPLANT
KIT DRSG PREVENA PLUS 7DAY 125 (MISCELLANEOUS) ×1 IMPLANT
LIGASURE IMPACT 36 18CM CVD LR (INSTRUMENTS) ×2 IMPLANT
PACK GENERAL/GYN (CUSTOM PROCEDURE TRAY) ×2 IMPLANT
PACK UNIVERSAL I (CUSTOM PROCEDURE TRAY) ×2 IMPLANT
PREVENA INCISION MGT 90 150 (MISCELLANEOUS) ×1 IMPLANT
SPONGE LAP 18X18 RF (DISPOSABLE) ×5 IMPLANT
STAPLER VISISTAT 35W (STAPLE) ×4 IMPLANT
SUCTION POOLE HANDLE (INSTRUMENTS)
SUT ETHILON 3 0 PS 1 (SUTURE) ×8 IMPLANT
SUT ETHILON 4 0 PS 2 18 (SUTURE) IMPLANT
SUT SILK 2 0 (SUTURE)
SUT SILK 2-0 18XBRD TIE 12 (SUTURE) ×1 IMPLANT
SUT VIC AB 3-0 SH 18 (SUTURE) ×4 IMPLANT
SUT VIC AB 4-0 SH 18 (SUTURE) ×3 IMPLANT
SUT VICRYL 0 27 CT2 27 ABS (SUTURE) ×1 IMPLANT
TOWEL OR 17X26 10 PK STRL BLUE (TOWEL DISPOSABLE) ×3 IMPLANT
TRAY FOLEY MTR SLVR 14FR STAT (SET/KITS/TRAYS/PACK) ×1 IMPLANT

## 2018-10-26 NOTE — Op Note (Signed)
Amy Adams  10/01/1966 26 October 2018    PCP:  Shirline Frees, MD   Surgeon: Kaylyn Lim, MD, FACS  Asst:  Alphonsa Overall, MD, FACS  Anes:  general  Preop Dx: panniculitis Postop Dx: Same post 10 lb panniculectomy  Procedure: Abdominal panniculectomy Location Surgery: [facility] 1 Complications: none  EBL:   minimla cc  Drains: none  Description of Procedure:  The patient was taken to OR 1 .  After anesthesia was administered and the patient was prepped  with Technicare and a timeout was performed.  The patient had been marked in the holding area looking at the lines of resection to remove her lower abdominal pannus.  Lines of resection went from just above the mons pubis to just above the umbilicus.  We explained that we would probably be taking her umbilicus and this resection.  Dr. Lucia Gaskins and I made incisions and created this large wedge elliptical type incision carrying this down into the subcutaneous tissue and then using the LigaSure orders to divide the fatty tissue and control the blood loss.  Emecheck hemostasis was achieved.  The wedge was taken down to the fascia and the umbilical stalk was taken off the fascia and that small defect was closed with a figure-of-eight suture of 0 Vicryl.  The area was irrigated.  The flaps looked healthy.  We approximated the flaps with 3-0 Vicryl sutures.  The closure was completed with interrupted vertical mattress sutures of 3-0 nylon.  The Praveena dressing was then applied to the incision and placed a VAC suction.  The patient tolerated the procedure well and was taken to the PACU in stable condition.     Matt B. Hassell Done, [place], [facility] [place]

## 2018-10-26 NOTE — Interval H&P Note (Signed)
History and Physical Interval Note:  10/26/2018 8:53 AM  [name]  has presented today for surgery, with the diagnosis of Panniculitis, Obesity  The various methods of treatment have been discussed with the patient and family. After consideration of risks, benefits and other options for treatment, the patient has consented to  Procedure(s): PANNICULECTOMY (N/A) as a surgical intervention .  The patient's history has been reviewed, patient examined, no change in status, stable for surgery.  I have reviewed the patient's chart and labs.  Questions were answered to the patient's satisfaction.     Amy Adams

## 2018-10-26 NOTE — Anesthesia Postprocedure Evaluation (Signed)
Anesthesia Post Note  Patient: Amy Adams  Procedure(s) Performed: PANNICULECTOMY (N/A )     Patient location during evaluation: PACU Anesthesia Type: General Level of consciousness: awake and alert Pain management: pain level controlled Vital Signs Assessment: post-procedure vital signs reviewed and stable Respiratory status: spontaneous breathing, nonlabored ventilation and respiratory function stable Cardiovascular status: blood pressure returned to baseline and stable Postop Assessment: no apparent nausea or vomiting Anesthetic complications: no    Last Vitals:  Vitals:   10/26/18 1500 10/26/18 1515  BP: (!) 152/79 132/81  Pulse: (!) 104 (!) 108  Resp: 15 19  Temp:    SpO2: 99% 97%    Last Pain:  Vitals:   10/26/18 1415  TempSrc:   PainSc: 0-No pain                 Brennan Bailey

## 2018-10-26 NOTE — Transfer of Care (Signed)
Immediate Anesthesia Transfer of Care Note  Patient: Amy Adams  Procedure(s) Performed: PANNICULECTOMY (N/A )  Patient Location: PACU  Anesthesia Type:General  Level of Consciousness: awake, alert  and oriented  Airway & Oxygen Therapy: Patient Spontanous Breathing and Patient connected to face mask oxygen  Post-op Assessment: Report given to RN and Post -op Vital signs reviewed and stable  Post vital signs: Reviewed and stable  Last Vitals:  Vitals Value Taken Time  BP 133/76 10/26/2018 12:47 PM  Temp    Pulse 101 10/26/2018 12:49 PM  Resp 12 10/26/2018 12:49 PM  SpO2 100 % 10/26/2018 12:49 PM  Vitals shown include unvalidated device data.  Last Pain:  Vitals:   10/26/18 0755  TempSrc: Oral  PainSc: 0-No pain         Complications: No apparent anesthesia complications

## 2018-10-26 NOTE — Anesthesia Procedure Notes (Signed)
Procedure Name: Intubation Date/Time: 10/26/2018 10:07 AM Performed by: Maxwell Caul, CRNA Pre-anesthesia Checklist: Patient identified, Emergency Drugs available, Suction available and Patient being monitored Patient Re-evaluated:Patient Re-evaluated prior to induction Oxygen Delivery Method: Circle system utilized Preoxygenation: Pre-oxygenation with 100% oxygen Induction Type: IV induction Ventilation: Mask ventilation without difficulty Laryngoscope Size: Mac Grade View: Grade I Tube type: Oral Tube size: 7.5 mm Number of attempts: 1 Airway Equipment and Method: Stylet and Oral airway Placement Confirmation: ETT inserted through vocal cords under direct vision,  positive ETCO2 and breath sounds checked- equal and bilateral Secured at: 21 cm Tube secured with: Tape Dental Injury: Teeth and Oropharynx as per pre-operative assessment

## 2018-10-27 ENCOUNTER — Encounter (HOSPITAL_COMMUNITY): Payer: Self-pay | Admitting: Surgery

## 2018-10-27 DIAGNOSIS — M069 Rheumatoid arthritis, unspecified: Secondary | ICD-10-CM | POA: Diagnosis not present

## 2018-10-27 DIAGNOSIS — E1142 Type 2 diabetes mellitus with diabetic polyneuropathy: Secondary | ICD-10-CM | POA: Diagnosis not present

## 2018-10-27 DIAGNOSIS — F419 Anxiety disorder, unspecified: Secondary | ICD-10-CM | POA: Diagnosis not present

## 2018-10-27 DIAGNOSIS — I10 Essential (primary) hypertension: Secondary | ICD-10-CM | POA: Diagnosis not present

## 2018-10-27 DIAGNOSIS — E669 Obesity, unspecified: Secondary | ICD-10-CM | POA: Diagnosis not present

## 2018-10-27 DIAGNOSIS — E039 Hypothyroidism, unspecified: Secondary | ICD-10-CM | POA: Diagnosis not present

## 2018-10-27 DIAGNOSIS — M793 Panniculitis, unspecified: Secondary | ICD-10-CM | POA: Diagnosis not present

## 2018-10-27 DIAGNOSIS — E65 Localized adiposity: Secondary | ICD-10-CM | POA: Diagnosis not present

## 2018-10-27 DIAGNOSIS — Z6831 Body mass index (BMI) 31.0-31.9, adult: Secondary | ICD-10-CM | POA: Diagnosis not present

## 2018-10-27 LAB — CBC
HCT: 37.1 % (ref 36.0–46.0)
Hemoglobin: 12.2 g/dL (ref 12.0–15.0)
MCH: 28.4 pg (ref 26.0–34.0)
MCHC: 32.9 g/dL (ref 30.0–36.0)
MCV: 86.5 fL (ref 80.0–100.0)
PLATELETS: 228 10*3/uL (ref 150–400)
RBC: 4.29 MIL/uL (ref 3.87–5.11)
RDW: 13.1 % (ref 11.5–15.5)
WBC: 10.4 10*3/uL (ref 4.0–10.5)
nRBC: 0 % (ref 0.0–0.2)

## 2018-10-27 LAB — GLUCOSE, CAPILLARY
Glucose-Capillary: 116 mg/dL — ABNORMAL HIGH (ref 70–99)
Glucose-Capillary: 143 mg/dL — ABNORMAL HIGH (ref 70–99)
Glucose-Capillary: 167 mg/dL — ABNORMAL HIGH (ref 70–99)
Glucose-Capillary: 187 mg/dL — ABNORMAL HIGH (ref 70–99)
Glucose-Capillary: 244 mg/dL — ABNORMAL HIGH (ref 70–99)

## 2018-10-27 LAB — BASIC METABOLIC PANEL
Anion gap: 13 (ref 5–15)
BUN: 19 mg/dL (ref 6–20)
CALCIUM: 9 mg/dL (ref 8.9–10.3)
CO2: 26 mmol/L (ref 22–32)
Chloride: 99 mmol/L (ref 98–111)
Creatinine, Ser: 1.05 mg/dL — ABNORMAL HIGH (ref 0.44–1.00)
GFR calc Af Amer: >60 mL/min (ref >60)
Glucose, Bld: 191 mg/dL — ABNORMAL HIGH (ref 70–99)
Potassium: 4 mmol/L (ref 3.5–5.1)
SODIUM: 138 mmol/L (ref 135–145)

## 2018-10-27 MED ORDER — OXYCODONE HCL 5 MG PO TABS: 5.0000 mg | ORAL_TABLET | Freq: Four times a day (QID) | ORAL | 0 refills | Status: DC | PRN

## 2018-10-27 NOTE — Discharge Summary (Signed)
Patient ID: Amy Adams [id number] 52 y.o. 1966-08-11  10/26/2018  Discharge date and time: 10/27/2018   Admitting Physician: Johnathan Hausen  Discharge Physician: Edward Jolly  Admission Diagnoses: Panniculitis, Obesity  Discharge Diagnoses: Same  Operations: Procedure(s): PANNICULECTOMY  Admission Condition: good  Discharged Condition: good  Indication for Admission: Amy Adams is an 52 y.o. female who has IDDM and has lost a lot of weight and has developed recurrent panniculitis.  She was electively admitted overnight by Dr. Hassell Done following panniculectomy.  Hospital Course: Patient underwent an uneventful panniculectomy.  The following morning she is comfortable.  Good pain control.  Ambulatory.  Foley is removed.  VAC dressing is dry and intact.  She is felt ready for discharge.   Disposition: Home  Patient Instructions:  Allergies as of 10/27/2018      Reactions   Dilaudid [hydromorphone Hcl] Nausea And Vomiting   Hard to wake up   Doxycycline Nausea And Vomiting   Topiramate Other (See Comments)   Suicidal thoughts per patient   Trazodone And Nefazodone Other (See Comments)   Hallucinations/ suicidal thoughts      Medication List    TAKE these medications   albuterol 108 (90 Base) MCG/ACT inhaler Commonly known as:  PROVENTIL HFA;VENTOLIN HFA Inhale 1-2 puffs into the lungs every 6 (six) hours as needed for wheezing or shortness of breath.   ALPRAZolam 1 MG tablet Commonly known as:  XANAX Take 1 mg by mouth 3 (three) times daily as needed for anxiety.   cyclobenzaprine 10 MG tablet Commonly known as:  FLEXERIL Take 1 tablet (10 mg total) by mouth 3 (three) times daily. What changed:    when to take this  reasons to take this   DULoxetine 60 MG capsule Commonly known as:  CYMBALTA Take 120 mg by mouth at bedtime.   estradiol 0.5 MG tablet Commonly known as:  ESTRACE Take 0.5 mg by mouth every evening.   fenofibrate  micronized 134 MG capsule Commonly known as:  LOFIBRA Take 134 mg by mouth every evening.   folic acid 1 MG tablet Commonly known as:  FOLVITE Take 2 mg by mouth daily.   FREESTYLE LIBRE 14 DAY SENSOR Misc USE AS DIRECTED EVERY 2 WEEKS SUBCUTANEOUSLY   gabapentin 400 MG capsule Commonly known as:  NEURONTIN TAKE 1 CAPSULE BY MOUTH 3 TIMES DAILY.   HUMALOG KWIKPEN 100 UNIT/ML injection Generic drug:  insulin lispro Inject 13 Units into the skin 3 (three) times daily with meals. 12-15 units sliding scale   hydrochlorothiazide 25 MG tablet Commonly known as:  HYDRODIURIL Take 25 mg by mouth daily.   hydroxychloroquine 200 MG tablet Commonly known as:  PLAQUENIL Take 400 mg by mouth at bedtime.   LANTUS SOLOSTAR 100 UNIT/ML injection Generic drug:  insulin glargine Inject 25 Units into the skin 2 (two) times daily. Per [facility]   levocetirizine 5 MG tablet Commonly known as:  XYZAL Take 5 mg by mouth every evening.   levothyroxine 50 MCG tablet Commonly known as:  SYNTHROID, LEVOTHROID Take 50 mcg by mouth daily before breakfast.   losartan 100 MG tablet Commonly known as:  COZAAR Take 100 mg by mouth daily.   metFORMIN 500 MG 24 hr tablet Commonly known as:  GLUCOPHAGE-XR Take 1,000 mg by mouth at bedtime.   METHOTREXATE (PF) [place] Inject 0.8 mLs into the skin once a week. thursday's   methotrexate 25 MG/ML (PF) [place] Inject 20 mg into the muscle every Thursday. Inject 0.8 mL  once weekly at bedtime. Every Thursday   montelukast 10 MG tablet Commonly known as:  SINGULAIR Take 10 mg by mouth at bedtime.   mupirocin ointment 2 % Commonly known as:  BACTROBAN Apply 1 application topically 3 (three) times daily.   nystatin-triamcinolone ointment Commonly known as:  MYCOLOG Apply 1 application topically 2 (two) times daily.   omeprazole 40 MG capsule Commonly known as:  PRILOSEC Take 40 mg by mouth every evening.   oxyCODONE 5 MG immediate release tablet Commonly  known as:  Oxy IR/ROXICODONE Take 1 tablet (5 mg total) by mouth every 6 (six) hours as needed for moderate pain, severe pain or breakthrough pain.   traMADol 50 MG tablet Commonly known as:  ULTRAM Take 1 tablet (50 mg total) by mouth every 8 (eight) hours as needed. What changed:  when to take this   UNIFINE PENTIPS 31G X 5 MM Misc Generic drug:  Insulin Pen Needle USE AS DIRECTED 5 TIMES PER DAY   valACYclovir 1000 MG tablet Commonly known as:  VALTREX Take 1,000 mg by mouth 2 (two) times daily as needed (fever blisters).   zolpidem 12.5 MG CR tablet Commonly known as:  AMBIEN CR Take 12.5 mg by mouth at bedtime.       Activity: no lifting, driving, or strenuous exercise for 1 week Diet: diabetic diet Wound Care: Incisional VAC dressing in place  Follow-up:  With Dr. Hassell Done in 1 week.  Signed: Edward Jolly MD, FACS  10/27/2018, 9:33 AM

## 2018-10-27 NOTE — Progress Notes (Signed)
Nurse reviewed discharge instructions with pt.  Pt verbalized understanding of discharge instructions, follow up appointments and new medications. Pt switched to [place] for discharge home.

## 2018-10-27 NOTE — Discharge Instructions (Signed)
[facility]      [facility] [phone]  OPEN ABDOMINAL SURGERY: POST OP INSTRUCTIONS  Always review your discharge instruction sheet given to you by the facility where your surgery was performed.  IF YOU HAVE DISABILITY OR FAMILY LEAVE FORMS, YOU MUST BRING THEM TO THE OFFICE FOR PROCESSING.  PLEASE DO NOT GIVE THEM TO YOUR DOCTOR.  1. A prescription for pain medication may be given to you upon discharge.  Take your pain medication as prescribed, if needed.  If narcotic pain medicine is not needed, then you may take acetaminophen (Tylenol) or ibuprofen (Advil) as needed. 2. Take your usually prescribed medications unless otherwise directed. 3. If you need a refill on your pain medication, please contact your pharmacy. They will contact our office to request authorization.  Prescriptions will not be filled after 5pm or on week-ends. 4. You should follow a light diet the first few days after arrival home, such as soup and crackers, pudding, etc.unless your doctor has advised otherwise. A high-fiber, low fat diet can be resumed as tolerated.   Be sure to include lots of fluids daily. Most patients will experience some swelling and bruising on the chest and neck area.  Ice packs will help.  Swelling and bruising can take several days to resolve 5. Most patients will experience some swelling and bruising in the area of the incision. Ice pack will help. Swelling and bruising can take several days to resolve..  6. It is common to experience some constipation if taking pain medication after surgery.  Increasing fluid intake and taking a stool softener will usually help or prevent this problem from occurring.  A mild laxative (Milk of Magnesia or Miralax) should be taken according to package directions if there are no bowel movements after 48 hours. 7.  You may have steri-strips (small skin tapes) in place directly over the incision.  These strips should be left on the skin for 7-10 days.  If your  surgeon used skin glue on the incision, you may shower in 24 hours.  The glue will flake off over the next 2-3 weeks.  Any sutures or staples will be removed at the office during your follow-up visit. You may find that a light gauze bandage over your incision may keep your staples from being rubbed or pulled. You may shower and replace the bandage daily. 8. ACTIVITIES:  You may resume regular (light) daily activities beginning the next day--such as daily self-care, walking, climbing stairs--gradually increasing activities as tolerated.  You may have sexual intercourse when it is comfortable.  Refrain from any heavy lifting or straining until approved by your doctor. a. You may drive when you no longer are taking prescription pain medication, you can comfortably wear a seatbelt, and you can safely maneuver your car and apply brakes b. Return to Work: ___________________________________ 9. You should see your doctor in the office for a follow-up appointment approximately two weeks after your surgery.  Make sure that you call for this appointment within a day or two after you arrive home to insure a convenient appointment time. OTHER INSTRUCTIONS:  _____________________________________________________________ _____________________________________________________________  WHEN TO CALL YOUR DOCTOR: 1. Fever over 101.0 2. Inability to urinate 3. Nausea and/or vomiting 4. Extreme swelling or bruising 5. Continued bleeding from incision. 6. Increased pain, redness, or drainage from the incision. 7. Difficulty swallowing or breathing 8. Muscle cramping or spasms. 9. Numbness or tingling in hands or feet or around lips.  The clinic staff is available to   answer your questions during regular business hours.  Please don't hesitate to call and ask to speak to one of the nurses if you have concerns.  For further questions, please visit [email or web address]   

## 2018-10-29 ENCOUNTER — Other Ambulatory Visit: Payer: Self-pay | Admitting: *Deleted

## 2018-10-29 MED FILL — ESTRADIOL 0.5 MG TABLET: 0.5 | 90 days supply | Qty: 90 | Fill #0

## 2018-10-29 NOTE — Patient Outreach (Signed)
[facility]) Care Management  10/29/2018  Amy Adams 1966-07-30 [id number]    Initial successful transition of care telephone call and assessment completed. See transition of care template for details. Amy Adams was hospitalized at [facility] from 10/26/18- 10/27/18 for abdominal panniculectomy. She was discharged home on 10/27/18 with a  Prevena dressing to suction.   Subjective: Amy Adams says she is in pain and has burning at the incisional area. She says she is taking her prescription pain medicine as directed. She says her surgical dressing has loosened around the edges because she perspires excessively and she sees only "bubbles and drops of liquid" in the tubing. She says she called the 800 phone number for the [facility] that she was provided at discharge but she did not leave a message. She says she was not sent home with any wound dressing supplies and home health RN services were not ordered. Advised her to call the [facility] back and leave a message and to call her surgeon's office and advise of the loosened dressing. Amy Adams voiced understanding and compliance.  Plan: Amy Adams this RNCM would call her tomorrow morning to ensure she has received a return phone call about how to resolve the vacuum dressing issue.    Barrington Ellison RN,CCM,CDE [place] Management Coordinator Office Phone [phone] Office Fax [phone]

## 2018-10-30 ENCOUNTER — Other Ambulatory Visit: Payer: Self-pay | Admitting: *Deleted

## 2018-10-30 NOTE — Patient Outreach (Signed)
[facility]) Care Management  10/30/2018  Amy Adams July 26, 1966 [id number]   Follow up phone call to [place] to ensure she received guidance on how to address the loosened edges of the surgical dressing to suction. She said she was told by customer service at the [facility] that as long as there were no alarms, the suction to her surgical wound is adequate. She said she also spoke with her surgeon's office and they reassured her there was no cause for alarm if the edges of the dressing were loose. She will she her surgeon on Friday 11/02/18. She also said she had a good night after she took her prescription pain medicine at bedtime.  No further care management needs identified so will close case to [facility] Management care management services and route successful outreach letter to [place] clinical pool to be mailed to patient's home address.   Barrington Ellison RN,CCM,CDE [place] Management Coordinator Office Phone [phone] Office Fax [phone]

## 2018-11-05 MED FILL — traMADol HCL 50 MG TABS: 50 | 90 days supply | Qty: 270 | Fill #0

## 2018-11-07 ENCOUNTER — Emergency Department (HOSPITAL_COMMUNITY): Payer: 59

## 2018-11-07 ENCOUNTER — Emergency Department (HOSPITAL_COMMUNITY)
Admission: EM | Admit: 2018-11-07 | Discharge: 2018-11-07 | Disposition: A | Discharge status: Home / Self Care | Payer: 59 | Attending: Emergency Medicine | Admitting: Emergency Medicine

## 2018-11-07 ENCOUNTER — Telehealth: Payer: Self-pay | Admitting: Surgery

## 2018-11-07 ENCOUNTER — Encounter (HOSPITAL_COMMUNITY): Payer: Self-pay

## 2018-11-07 ENCOUNTER — Other Ambulatory Visit: Payer: Self-pay

## 2018-11-07 DIAGNOSIS — G8918 Other acute postprocedural pain: Secondary | ICD-10-CM | POA: Diagnosis not present

## 2018-11-07 DIAGNOSIS — E118 Type 2 diabetes mellitus with unspecified complications: Secondary | ICD-10-CM | POA: Diagnosis not present

## 2018-11-07 DIAGNOSIS — L7622 Postprocedural hemorrhage and hematoma of skin and subcutaneous tissue following other procedure: Secondary | ICD-10-CM | POA: Diagnosis not present

## 2018-11-07 DIAGNOSIS — N281 Cyst of kidney, acquired: Secondary | ICD-10-CM | POA: Diagnosis not present

## 2018-11-07 DIAGNOSIS — Z79899 Other long term (current) drug therapy: Secondary | ICD-10-CM | POA: Diagnosis not present

## 2018-11-07 DIAGNOSIS — R319 Hematuria, unspecified: Secondary | ICD-10-CM | POA: Diagnosis not present

## 2018-11-07 DIAGNOSIS — Z794 Long term (current) use of insulin: Secondary | ICD-10-CM | POA: Insufficient documentation

## 2018-11-07 LAB — CBC WITH DIFFERENTIAL/PLATELET
ABS IMMATURE GRANULOCYTES: 0.1 10*3/uL — AB (ref 0.00–0.07)
Basophils Absolute: 0 10*3/uL (ref 0.0–0.1)
Basophils Relative: 0 %
Eosinophils Absolute: 0 10*3/uL (ref 0.0–0.5)
Eosinophils Relative: 0 %
HCT: 35.4 % — ABNORMAL LOW (ref 36.0–46.0)
Hemoglobin: 11.6 g/dL — ABNORMAL LOW (ref 12.0–15.0)
Immature Granulocytes: 1 %
Lymphocytes Relative: 7 %
Lymphs Abs: 1 10*3/uL (ref 0.7–4.0)
MCH: 28.4 pg (ref 26.0–34.0)
MCHC: 32.8 g/dL (ref 30.0–36.0)
MCV: 86.8 fL (ref 80.0–100.0)
Monocytes Absolute: 1 10*3/uL (ref 0.1–1.0)
Monocytes Relative: 7 %
NEUTROS PCT: 85 %
Neutro Abs: 12.5 10*3/uL — ABNORMAL HIGH (ref 1.7–7.7)
Platelets: 219 10*3/uL (ref 150–400)
RBC: 4.08 MIL/uL (ref 3.87–5.11)
RDW: 12.5 % (ref 11.5–15.5)
WBC: 14.6 10*3/uL — ABNORMAL HIGH (ref 4.0–10.5)
nRBC: 0 % (ref 0.0–0.2)

## 2018-11-07 LAB — BASIC METABOLIC PANEL
Anion gap: 13 (ref 5–15)
BUN: 27 mg/dL — ABNORMAL HIGH (ref 6–20)
CALCIUM: 9 mg/dL (ref 8.9–10.3)
CO2: 25 mmol/L (ref 22–32)
Chloride: 98 mmol/L (ref 98–111)
Creatinine, Ser: 1.23 mg/dL — ABNORMAL HIGH (ref 0.44–1.00)
GFR calc Af Amer: 58 mL/min — ABNORMAL LOW (ref >60)
GFR calc non Af Amer: 50 mL/min — ABNORMAL LOW (ref >60)
Glucose, Bld: 137 mg/dL — ABNORMAL HIGH (ref 70–99)
Potassium: 3.4 mmol/L — ABNORMAL LOW (ref 3.5–5.1)
Sodium: 136 mmol/L (ref 135–145)

## 2018-11-07 LAB — URINALYSIS, ROUTINE W REFLEX MICROSCOPIC
Bilirubin Urine: NEGATIVE
Glucose, UA: NEGATIVE mg/dL
Hgb urine dipstick: NEGATIVE
KETONES UR: 5 mg/dL — AB
Nitrite: NEGATIVE
Protein, ur: 30 mg/dL — AB
Specific Gravity, Urine: 1.019 (ref 1.005–1.030)
pH: 6 (ref 5.0–8.0)

## 2018-11-07 MED ORDER — CEPHALEXIN 500 MG PO CAPS: 500.0000 mg | ORAL_CAPSULE | Freq: Four times a day (QID) | ORAL | 0 refills | Status: AC

## 2018-11-07 MED ORDER — SODIUM CHLORIDE 0.9 % IV BOLUS
1000.0000 mL | Freq: Once | INTRAVENOUS | Status: AC
  Administered 2018-11-07: 1000 mL via INTRAVENOUS

## 2018-11-07 MED ORDER — ACETAMINOPHEN 325 MG PO TABS
650.0000 mg | ORAL_TABLET | Freq: Once | ORAL | Status: AC
  Administered 2018-11-07: 650 mg via ORAL
  Filled 2018-11-07: qty 2

## 2018-11-07 MED ORDER — SODIUM CHLORIDE (PF) 0.9 % IJ SOLN
INTRAMUSCULAR | Status: AC
  Administered 2018-11-07: 22:00:00
  Filled 2018-11-07: qty 50

## 2018-11-07 MED ORDER — IOPAMIDOL (ISOVUE-300) INJECTION 61%
100.0000 mL | Freq: Once | INTRAVENOUS | Status: AC | PRN
  Administered 2018-11-07: 100 mL via INTRAVENOUS

## 2018-11-07 MED ORDER — CEPHALEXIN 500 MG PO CAPS
500.0000 mg | ORAL_CAPSULE | Freq: Once | ORAL | Status: AC
  Administered 2018-11-07: 500 mg via ORAL
  Filled 2018-11-07: qty 1

## 2018-11-07 NOTE — ED Triage Notes (Addendum)
Pt states that she has been urinating out blood. Pt had recent surgery for panniculitis.  Pt describes bright red blood. Pt and family are very anxious. Pt states that the bleeding started yesterday and increased today. Upon standing, blood started "pouring" out. Pt states she had a large BM today, but that was the first one since last Wednesday.

## 2018-11-07 NOTE — ED Provider Notes (Signed)
[facility] DEPT Provider Note   CSN: [id number] Arrival date & time: 11/07/18  1819     History   Chief Complaint Chief Complaint  Patient presents with  . Post-op Problem    HPI Amy Adams is a 52 y.o. female presents with bleeding and drainage from incision site after panniculectomy on 12/13 by general surgeon Dr. Hassell Done onset 12/20. Patient denies any complications from surgery. Patient reports she saw her surgeon on Friday and has her wound vac removed. Patient states incision pain and drainage worsened after wound vac was removed. Patient describes pain as heavy and a pulling sensation. Patient reports she has tried oxycodone, ice, and tramadol with partial relief. Patient states she has only taken tylenol at 1pm for pain control today. Patient reports fever onset 2 days ago and states temperature was 102F at home yesterday. Patient reports blood in urine, but she is unsure if blood came from incision. Patient reports chills, loss of appetite, and sweats. Patient denies vomiting, diarrhea, or pain with urination.   HPI  Past Medical History:  Diagnosis Date  . Anxiety   . Arthritis   . Chronic foot pain, left   . GERD (gastroesophageal reflux disease)   . History of anal fissures   . Hyperlipidemia   . Hypertension   . Hypothyroidism   . Insulin dependent type 2 diabetes mellitus [facility])    endrocrinologist-- dr Chalmers Cater  . Migraines   . Peripheral neuropathy   . RA (rheumatoid arthritis) [facility])    rheumatologist-  dr Amil Amen  . SUI (stress urinary incontinence, female)   . Wears glasses     Patient Active Problem List   Diagnosis Date Noted  . S/P panniculectomy 10/26/2018  . Panniculitis 09/04/2018  . HYPOTHYROIDISM 12/01/2010  . Type 2 diabetes mellitus with diabetic polyneuropathy, with long-term current use of insulin ([place]) 12/01/2010  . 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 OTHER AND UNSPECIFIED  HYPERLIPIDEMIA 12/01/2010  . OBESITY, UNSPECIFIED 12/01/2010  . ESSENTIAL HYPERTENSION, BENIGN 12/01/2010  . Rheumatoid arthritis ([place]) 12/01/2010    Past Surgical History:  Procedure Laterality Date  . ABDOMINAL HYSTERECTOMY  1990   with Right Salpingo--ophorectomy  . ANAL SPHINCTEROTOMY  01-05-2006    dr Rise Patience @[facility]   . Lillard Anes  09-25-2009   dr Milinda Pointer @[facility]    right great toe  . CARPAL TUNNEL RELEASE Bilateral 1996;  1997  . CESAREAN SECTION  1988  . FOOT SURGERY  12/ 2014   dr Milinda Pointer   left 2nd hammertoe repair,  left 2nd metatarsal matrixectomy, left heel endoscopic plantar fasiotomy  . PANNICULECTOMY N/A 10/26/2018   Procedure: PANNICULECTOMY;  Surgeon: Johnathan Hausen, MD;  Location: [facility];  Service: General;  Laterality: N/A;  . TENDON REPAIR  09/2015   left posterior tibial and peroneal tendon repairs  . TOE AMPUTATION  01/2015   right 2nd toe  . TONSILLECTOMY  age 51  . TRANSOBTURATOR SLING  01-08-2004   dr Jeffie Pollock @[facility]      OB History   No obstetric history on file.      Home Medications    Prior to Admission medications   Medication Sig Start Date End Date Taking? Authorizing Provider  acetaminophen (TYLENOL) 500 MG tablet Take 500 mg by mouth every 6 (six) hours as needed for fever.   Yes [provider]  cyclobenzaprine (FLEXERIL) 10 MG tablet Take 1 tablet (10 mg total) by mouth 3 (three) times daily. Patient taking differently: Take 10  mg by mouth 3 (three) times daily as needed for muscle spasms.  07/10/18  Yes Hyatt, Max T, DPM  DULoxetine (CYMBALTA) 60 MG capsule Take 120 mg by mouth at bedtime.   Yes [provider]  estradiol (ESTRACE) 0.5 MG tablet Take 0.5 mg by mouth every evening.    Yes [provider]  fenofibrate micronized (LOFIBRA) 134 MG capsule Take 134 mg by mouth every evening.    Yes [provider]  folic acid (FOLVITE) 1 MG tablet Take 2 mg by mouth daily.    Yes [provider]  gabapentin  (NEURONTIN) 400 MG capsule TAKE 1 CAPSULE BY MOUTH 3 TIMES DAILY. Patient taking differently: Take 400 mg by mouth 3 (three) times daily.  01/31/18  Yes Hyatt, Max T, DPM  hydrochlorothiazide (HYDRODIURIL) 25 MG tablet Take 25 mg by mouth daily.   Yes [provider]  hydroxychloroquine (PLAQUENIL) 200 MG tablet Take 400 mg by mouth at bedtime.    Yes [provider]  insulin glargine (LANTUS SOLOSTAR) 100 UNIT/ML injection Inject 25 Units into the skin 2 (two) times daily. Per [place]   Yes [provider]  insulin lispro (HUMALOG KWIKPEN) 100 UNIT/ML injection Inject 13 Units into the skin 3 (three) times daily with meals. 12-15 units sliding scale   Yes [provider]  levocetirizine (XYZAL) 5 MG tablet Take 5 mg by mouth every evening.   Yes [provider]  levothyroxine (SYNTHROID, LEVOTHROID) 50 MCG tablet Take 50 mcg by mouth daily before breakfast.    Yes [provider]  losartan (COZAAR) 100 MG tablet Take 100 mg by mouth daily.   Yes [provider]  metFORMIN (GLUCOPHAGE-XR) 500 MG 24 hr tablet Take 1,000 mg by mouth at bedtime.    Yes [provider]  montelukast (SINGULAIR) 10 MG tablet Take 10 mg by mouth at bedtime.   Yes [provider]  mupirocin ointment (BACTROBAN) 2 % Apply 1 application topically 3 (three) times daily.  05/24/18  Yes [provider]  nystatin-triamcinolone ointment (MYCOLOG) Apply 1 application topically 2 (two) times daily.  01/31/18  Yes [provider]  omeprazole (PRILOSEC) 40 MG capsule Take 40 mg by mouth every evening.    Yes [provider]  oxyCODONE (OXY IR/ROXICODONE) 5 MG immediate release tablet Take 1 tablet (5 mg total) by mouth every 6 (six) hours as needed for moderate pain, severe pain or breakthrough pain. 10/27/18  Yes Hoxworth, Marland Kitchen, MD  traMADol (ULTRAM) 50 MG tablet Take 1 tablet (50 mg total) by mouth every 8 (eight) hours as  needed. Patient taking differently: Take 50 mg by mouth at bedtime.  12/13/16  Yes Hyatt, Max T, DPM  zolpidem (AMBIEN CR) 12.5 MG CR tablet Take 12.5 mg by mouth at bedtime.    Yes [provider]  albuterol (PROVENTIL HFA;VENTOLIN HFA) 108 (90 BASE) MCG/ACT inhaler Inhale 1-2 puffs into the lungs every 6 (six) hours as needed for wheezing or shortness of breath.    [provider]  ALPRAZolam Duanne Moron) 1 MG tablet Take 1 mg by mouth 3 (three) times daily as needed for anxiety.     [provider]  cephALEXin (KEFLEX) 500 MG capsule Take 1 capsule (500 mg total) by mouth 4 (four) times daily for 5 days. 11/07/18 11/12/18  Darlin Drop P, PA-C  Continuous Blood Gluc Sensor (FREESTYLE LIBRE 14 DAY SENSOR) MISC USE AS DIRECTED EVERY 2 WEEKS SUBCUTANEOUSLY 05/24/18   [provider]  methotrexate 25 MG/ML SOLN Inject 20 mg into the muscle every Thursday. Inject 0.8 mL once weekly at bedtime. Every Thursday    [provider]  Methotrexate, Anti-Rheumatic, (METHOTREXATE, PF, [place]) Inject 0.8 mLs into the skin once a week. thursday's    [provider]  oxyCODONE (OXY IR/ROXICODONE) 5 MG immediate release tablet Take 1 tablet (5 mg total) by mouth every 6 (six) hours as needed for moderate pain, severe pain or breakthrough pain. 10/27/18   Excell Seltzer, MD  UNIFINE PENTIPS 31G X 5 MM MISC USE AS DIRECTED 5 TIMES PER DAY 06/19/18   [provider]  valACYclovir (VALTREX) 1000 MG tablet Take 1,000 mg by mouth 2 (two) times daily as needed (fever blisters).  05/01/18   [provider]    Family History Family History  Problem Relation Age of Onset  . Diabetes Mother   . Migraines Mother   . Diabetes Sister     Social History Social History   Tobacco Use  . Smoking status: Never Smoker  . Smokeless tobacco: Never Used  Substance Use Topics  . Alcohol use: No  . Drug use: Never     Allergies   Dilaudid [hydromorphone  hcl]; Doxycycline; Topiramate; and Trazodone and nefazodone   Review of Systems Review of Systems  Constitutional: Positive for appetite change, chills, diaphoresis, fatigue and fever.  Respiratory: Negative for cough and shortness of breath.   Cardiovascular: Negative for chest pain.  Gastrointestinal: Negative for abdominal pain, diarrhea, nausea and vomiting.  Endocrine: Negative for cold intolerance and heat intolerance.  Genitourinary: Negative for dysuria.  Musculoskeletal: Negative for back pain and gait problem.  Skin: Positive for color change and wound. Negative for rash.  Neurological: Negative for dizziness and weakness.  Hematological: Negative for adenopathy.     Physical Exam Updated Vital Signs BP (!) 109/94 (BP Location: Left Arm)   Pulse 93   Temp 98 F (36.7 C) (Oral)   Resp 15   Ht 5\' 9"  (1.753 m)   Wt 91.6 kg   SpO2 98%   BMI 29.83 kg/m   Physical Exam Vitals signs and nursing note reviewed.  Constitutional:      General: She is not in acute distress.    Appearance: She is well-developed. She is not diaphoretic.  HENT:     Head: Normocephalic and atraumatic.  Neck:     Musculoskeletal: Normal range of motion.  Cardiovascular:     Rate and Rhythm: Normal rate and regular rhythm.     Heart sounds: Normal heart sounds. No murmur. No friction rub. No gallop.   Pulmonary:     Effort: Pulmonary effort is normal. No respiratory distress.     Breath sounds: Normal breath sounds. No wheezing or rales.  Abdominal:     Palpations: Abdomen is soft.     Tenderness: There is no abdominal tenderness.  Musculoskeletal: Normal range of motion.  Skin:    General: Skin is warm.     Findings: Erythema present. No rash.       Neurological:     Mental Status: She is alert.      ED Treatments / Results  Labs (all labs ordered are listed, but only abnormal results are displayed) Labs Reviewed  CBC WITH DIFFERENTIAL/PLATELET - Abnormal; Notable for the  following components:      Result Value   WBC 14.6 (*)    Hemoglobin 11.6 (*)    HCT 35.4 (*)    Neutro Abs 12.5 (*)  Abs Immature Granulocytes 0.10 (*)    All other components within normal limits  URINALYSIS, ROUTINE W REFLEX MICROSCOPIC - Abnormal; Notable for the following components:   Color, Urine AMBER (*)    APPearance HAZY (*)    Ketones, ur 5 (*)    Protein, ur 30 (*)    Leukocytes, UA MODERATE (*)    Bacteria, UA MANY (*)    All other components within normal limits  BASIC METABOLIC PANEL - Abnormal; Notable for the following components:   Potassium 3.4 (*)    Glucose, Bld 137 (*)    BUN 27 (*)    Creatinine, Ser 1.23 (*)    GFR calc non Af Amer 50 (*)    GFR calc Af Amer 58 (*)    All other components within normal limits  URINE CULTURE   Hemoglobin  Date Value Ref Range Status  11/07/2018 11.6 (L) 12.0 - 15.0 g/dL Final  10/27/2018 12.2 12.0 - 15.0 g/dL Final  10/26/2018 13.1 12.0 - 15.0 g/dL Final  10/22/2018 14.1 12.0 - 15.0 g/dL Final   BUN  Date Value Ref Range Status  11/07/2018 27 (H) 6 - 20 mg/dL Final  10/27/2018 19 6 - 20 mg/dL Final  10/22/2018 16 6 - 20 mg/dL Final  06/15/2016 9 6 - 20 mg/dL Final   Creatinine, Ser  Date Value Ref Range Status  11/07/2018 1.23 (H) 0.44 - 1.00 mg/dL Final  10/27/2018 1.05 (H) 0.44 - 1.00 mg/dL Final  10/26/2018 1.26 (H) 0.44 - 1.00 mg/dL Final  10/22/2018 0.86 0.44 - 1.00 mg/dL Final    EKG None  Radiology Ct Abdomen Pelvis W Contrast  Result Date: 11/07/2018 CLINICAL DATA:  Acute onset of hematuria. EXAM: CT ABDOMEN AND PELVIS WITH CONTRAST TECHNIQUE: Multidetector CT imaging of the abdomen and pelvis was performed using the standard protocol following bolus administration of intravenous contrast. CONTRAST:  182mL ISOVUE-300 IOPAMIDOL (ISOVUE-300) INJECTION 61% COMPARISON:  CT of the abdomen and pelvis from 01/11/2010, and MRI of the lumbar spine performed 11/24/2006 FINDINGS: Lower chest: Mild scarring  is noted at the lung bases. The visualized portions of the mediastinum are unremarkable. Hepatobiliary: The liver is unremarkable in appearance. The gallbladder is unremarkable in appearance. The common bile duct remains normal in caliber. Pancreas: The pancreas is within normal limits. Spleen: The spleen is enlarged, measuring 15.5 cm in length. Adrenals/Urinary Tract: The adrenal glands are unremarkable in appearance. Nonspecific perinephric stranding is noted bilaterally. A left renal cyst is noted. There is no evidence of hydronephrosis. No renal or ureteral stones are identified. Stomach/Bowel: The stomach is unremarkable in appearance. The small bowel is within normal limits. The appendix is normal in caliber, without evidence of appendicitis. The colon is unremarkable in appearance. Vascular/Lymphatic: The abdominal aorta is unremarkable in appearance. A circumaortic left renal vein is noted. The inferior vena cava is grossly unremarkable. No retroperitoneal lymphadenopathy is seen. No pelvic sidewall lymphadenopathy is identified. Reproductive: There is question of mild wall thickening along the base of the bladder on sagittal images. The bladder is otherwise unremarkable. The patient is status post hysterectomy. No suspicious adnexal masses are seen. Other: A large amount of postoperative fluid is noted along the anterior abdominal wall, with trace air, reflecting recent panniculectomy. Musculoskeletal: No acute osseous abnormalities are identified. The visualized musculature is unremarkable in appearance. IMPRESSION: 1. Question of mild wall thickening along the base of the bladder on sagittal images. Given the patient's hematuria, cystoscopy would be helpful for further evaluation, as  deemed clinically appropriate. 2. Large amount of postoperative fluid along the anterior abdominal wall, with trace air, reflecting recent panniculectomy. 3. Left renal cyst noted. 4. Splenomegaly. 5. Circumaortic left renal  vein incidentally noted. Electronically Signed   By: Garald Balding M.D.   On: 11/07/2018 21:11    Procedures Procedures (including critical care time)  Medications Ordered in ED Medications  acetaminophen (TYLENOL) tablet 650 mg (650 mg Oral Given 11/07/18 2001)  sodium chloride 0.9 % bolus 1,000 mL (0 mLs Intravenous Stopped 11/07/18 2153)  iopamidol (ISOVUE-300) 61 % injection 100 mL (100 mLs Intravenous Contrast Given 11/07/18 2046)  sodium chloride (PF) 0.9 % injection (  Given by Other 11/07/18 2134)  cephALEXin (KEFLEX) capsule 500 mg (500 mg Oral Given 11/07/18 2156)     Initial Impression / Assessment and Plan / ED Course  I have reviewed the triage vital signs and the nursing notes.  Pertinent labs & imaging results that were available during my care of the patient were reviewed by me and considered in my medical decision making (see chart for details).  Clinical Course as of Nov 08 2207  Wed Nov 07, 2018  2009 Leukocytosis noted at 14.6. Suspect wound infection.   WBC(!): 14.6 [AH]  2031 Elevated BUN and creatinine. Will provide IVF.  Creatinine(!): 1.23 [AH]  2102 Moderate leukocytes and many bacteria noted on UA.  Leukocytes, UA(!): MODERATE [AH]  2135 CT reveals mild wall thickening along the base of the bladder. Large amount of postoperative fluid along the anterior abdominal wall, with trace air, reflecting recent panniculectomy. Left renal cyst and Splenomegaly noted.    CT Abdomen Pelvis W Contrast [AH]    Clinical Course User Index [AH] Arville Lime, PA-C   Suspect symptoms are likely due to a wound infection. Leukocytosis noted on CBC at 14.6. UA reveals leukocytes and bacteria. Ordered Urine culture. CT reveals postoperative fluid along anterior abdominal wall consistent with recent panniculectomy. Provided tylenol for pain, antibiotics, and IVF in the ER. Will prescribe antibiotics and advised patient to follow up with surgeon in 1 day. Patient states  she understands and agrees with plan.    Findings and plan of care discussed with supervising physician Dr. Vanita Panda.  Final Clinical Impressions(s) / ED Diagnoses   Final diagnoses:  Post-operative pain    ED Discharge Orders         Ordered    cephALEXin (KEFLEX) 500 MG capsule  4 times daily     11/07/18 2205           Arville Lime, [place] 11/07/18 2208    Carmin Muskrat, MD 11/07/18 2231

## 2018-11-07 NOTE — Discharge Instructions (Addendum)
You have been seen today for . Please read and follow all provided instructions.   1. Medications: Keflex (antibiotic), usual home medications 2. Treatment: rest, drink plenty of fluids 3. Follow Up: Please call and follow up with your surgeon in 1 day. Please follow up with your primary doctor in 7 days for discussion of your diagnoses and further evaluation after today's visit; if you do not have a primary care doctor use the resource guide provided to find one; Please return to the ER for any new or worsening symptoms. Please obtain all of your results from medical records or have your doctors office obtain the results - share them with your doctor - you should be seen at your doctors office. Call today to arrange your follow up.   Take medications as prescribed. Please review all of the medicines and only take them if you do not have an allergy to them. Return to the emergency room for worsening condition or new concerning symptoms. Follow up with your regular doctor. If you don't have a regular doctor use one of the numbers below to establish a primary care doctor.  Please be aware that if you are taking birth control pills, taking other prescriptions, ESPECIALLY ANTIBIOTICS may make the birth control ineffective - if this is the case, either do not engage in sexual activity or use alternative methods of birth control such as condoms until you have finished the medicine and your family doctor says it is OK to restart them. If you are on a blood thinner such as COUMADIN, be aware that any other medicine that you take may cause the coumadin to either work too much, or not enough - you should have your coumadin level rechecked in next 7 days if this is the case.  ?  It is also a possibility that you have an allergic reaction to any of the medicines that you have been prescribed - Everybody reacts differently to medications and while MOST people have no trouble with most medicines, you may have a reaction  such as nausea, vomiting, rash, swelling, shortness of breath. If this is the case, please stop taking the medicine immediately and contact your physician.  ?  You should return to the ER if you develop severe or worsening symptoms.   Emergency Department Resource Guide 1) Find a Doctor and Pay Out of Pocket Although you won't have to find out who is covered by your insurance plan, it is a good idea to ask around and get recommendations. You will then need to call the office and see if the doctor you have chosen will accept you as a new patient and what types of options they offer for patients who are self-pay. Some doctors offer discounts or will set up payment plans for their patients who do not have insurance, but you will need to ask so you aren't surprised when you get to your appointment.  2) Contact Your Local Health Department Not all health departments have doctors that can see patients for sick visits, but many do, so it is worth a call to see if yours does. If you don't know where your local health department is, you can check in your phone book. The CDC also has a tool to help you locate your state's health department, and many state websites also have listings of all of their local health departments.  3) Find a [facility] If your illness is not likely to be very severe or complicated, you may want  to try a walk in clinic. These are popping up all over the country in pharmacies, drugstores, and shopping centers. They're usually staffed by nurse practitioners or physician assistants that have been trained to treat common illnesses and complaints. They're usually fairly quick and inexpensive. However, if you have serious medical issues or chronic medical problems, these are probably not your best option.  No Primary Care Doctor: Call Health Connect at  [phone] - they can help you locate a primary care doctor that  accepts your insurance, provides certain services, etc. Physician  Referral [phone]  Emergency Department Resource Guide 1) Find a Doctor and Pay Out of Pocket Although you won't have to find out who is covered by your insurance plan, it is a good idea to ask around and get recommendations. You will then need to call the office and see if the doctor you have chosen will accept you as a new patient and what types of options they offer for patients who are self-pay. Some doctors offer discounts or will set up payment plans for their patients who do not have insurance, but you will need to ask so you aren't surprised when you get to your appointment.  2) Contact Your Local Health Department Not all health departments have doctors that can see patients for sick visits, but many do, so it is worth a call to see if yours does. If you don't know where your local health department is, you can check in your phone book. The CDC also has a tool to help you locate your state's health department, and many state websites also have listings of all of their local health departments.  3) Find a [facility] If your illness is not likely to be very severe or complicated, you may want to try a walk in clinic. These are popping up all over the country in pharmacies, drugstores, and shopping centers. They're usually staffed by nurse practitioners or physician assistants that have been trained to treat common illnesses and complaints. They're usually fairly quick and inexpensive. However, if you have serious medical issues or chronic medical problems, these are probably not your best option.  No Primary Care Doctor: Call Health Connect at  [phone] - they can help you locate a primary care doctor that  accepts your insurance, provides certain services, etc. Physician Referral Service- [phone]  Chronic Pain Problems: Organization         Address  Phone   Notes  [facility]  [phone] Patients need to be referred by their primary care  doctor.   Medication Assistance: Organization         Address  Phone   Notes  [facility] Medication [facility] [address] [place], [place] [address] [phone] --Must be a resident of [facility] -- Must have NO insurance coverage whatsoever (no Medicaid/ Medicare, etc.) -- The pt. MUST have a primary care doctor that directs their care regularly and follows them in the community   [facility]  [phone]   [employer]  [phone]    Agencies that provide inexpensive medical care: Organization         Address  Phone   Notes  [place]  [phone]   [facility]    [phone]   [facility] [place], [place] [address] [phone]   [facility] [address] [place] [phone]  Planned Parenthood    [phone]   [facility]    [phone]   [facility] and [address], [place] Phone:  [phone], Fax:  [phone] Hours of Operation:  9 am - 6 pm, M-F.  Also accepts Medicaid/Medicare and self-pay.  [facility] for [place], [address], [place] Phone: [phone], Fax: [phone]. Hours of Operation:  8:30 am - 5:30 pm, M-F.  Also accepts Medicaid and self-pay.  [facility] [place] [address] [place] Phone: [phone]   [place], [place], [place] [phone], Ext. [phone] Mondays & Thursdays: 7-9 AM.  First 15 patients are seen on a first come, first serve basis.    [place] Prov[facility]:  Organization         Address  Phone   Notes  [facility] [address], [place] [phone] Also accepts self-pay patients.  [facility] [address], [place]  [phone]   [place], [address], [place] [phone]   [facility] [address] [place] [phone]   Lucianne Lei [address], [place]   [phone] Only accepts [place] [facility] [place] patients after they have their name applied to their card.   Self-Pay (no insurance) in [facility]:  Organization         Address  Phone   Notes  Sickle Cell Patients, [facility] [place] [phone]   [facility] [place] [phone]   [facility] [place]  [place], [place], [place] [phone]   [facility]/Dr. Osei-Bonsu  [address] [place] or [address], [place] [phone] Phone number for both [place] and [place] locations is the same.  Urgent Medical and [facility] [address], [place] [phone]   [facility] [address], [place] or [address] [phone]   [facility] [address] [place] [phone], phone; [phone], fax Sees patients 1st and 3rd Saturday of every month.  Must not qualify for public or private insurance (i.e. Medicaid, Medicare, [place] [employer] Choice, Veterans' Benefits)  Household income should be no more than 200% of the poverty level The clinic cannot treat you if you are pregnant or think you are pregnant  Sexually transmitted diseases are not treated at the clinic.

## 2018-11-07 NOTE — ED Notes (Signed)
ED Provider at bedside. 

## 2018-11-07 NOTE — ED Notes (Signed)
Pt aware urine sample is needed 

## 2018-11-07 NOTE — Telephone Encounter (Signed)
Amy Adams  May 19, 1966 [id number]  Patient Care Team: Shirline Frees, MD as PCP - General (Family Medicine)  This patient is a 52 y.o.female who calls today for surgical evaluation.   Date of procedure/visit: 10/26/2018  Surgeon:         Kaylyn Lim, MD, FACS  Asst:                Alphonsa Overall, MD, FACS  Anes:               general  Preop Dx:        panniculitis Postop Dx:      Same post 10 lb panniculectomy  Procedure:      Abdominal panniculectomy  Reason for call: Feeling sick  Patient calls Christmas Day with 3-day complaints of feeling sick.  Had 80 pound of unintentional weight loss after her husband died over the past year with worsening inflammation and panniculitis.  No prior weight loss surgery.  Evaluated by Dr. Hassell Done and followed for the past several months.  Felt to benefit from panniculectomy.  Underwent that uneventfully 2 weeks ago.  Had follow-up visit in office last week foe incisional wound vac removal.  No issues & no problem at her incision.    Patient notes that for the past 3 days she is felt nauseated and sore.  Has not tolerated solids for the past 2 days.  Keeping thin liquids and juices down.  Diabetic.  Glucoses running 160 or less.  Notes blood running down her leg when she stands up.  She cannot tell if it is from the incision although her family says it is bruised.  Not on blood thinners but taking a lot of ibuprofen.  Patient thinks she has been urinating blood.  She has had some intermittent soreness taking a lot of ibuprofen and oxycodone.  Felt hot yesterday.  Thinks she had a temperature of 101 yesterday.  Not today.  Family member thought she looked jaundiced.  I recommended the patient go to the emergency room to sort things out.  She had panniculectomy removing soft tissue from her abdominal wall and looked fine 1 week postop in the office.  Perhaps she has some gastroenteritis or some other new issue going on.  Stop taking ibuprofen as that  can encourage oozing/bleeding.  Consider switching to Tylenol.  Liquids.  She was hesitant to come to the emergency room.  She was concerned that there were no pharmacies open on Christmas since [place] outside the city.  However [facility] member [facility].  She is leaning more towards trying to just do liquids and ride this out for a few days.  I tried to discourage her from that.  Recommend emergency room so they could do lab work, IV fluids.  Further work-up.  I am skeptical that her new symptoms for the past 3 days are really related to medical ectomy and something else is going on, but need to be safe.  She will think about it.    Patient Active Problem List   Diagnosis Date Noted  . S/P panniculectomy 10/26/2018  . Panniculitis 09/04/2018  . HYPOTHYROIDISM 12/01/2010  . Type 2 diabetes mellitus with diabetic polyneuropathy, with long-term current use of insulin ([place]) 12/01/2010  . 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 OTHER AND UNSPECIFIED HYPERLIPIDEMIA 12/01/2010  . OBESITY, UNSPECIFIED 12/01/2010  . ESSENTIAL HYPERTENSION, BENIGN 12/01/2010  . Rheumatoid arthritis ([place]) 12/01/2010    Past Medical History:  Diagnosis Date  . Anxiety   . Arthritis   . Chronic foot pain, left   . GERD (gastroesophageal reflux disease)   . History of anal fissures   . Hyperlipidemia   . Hypertension   . Hypothyroidism   . Insulin dependent type 2 diabetes mellitus [facility])    endrocrinologist-- dr Chalmers Cater  . Migraines   . Peripheral neuropathy   . RA (rheumatoid arthritis) [facility])    rheumatologist-  dr Amil Amen  . SUI (stress urinary incontinence, female)   . Wears glasses     Past Surgical History:  Procedure Laterality Date  . ABDOMINAL HYSTERECTOMY  1990   with Right Salpingo--ophorectomy  . ANAL SPHINCTEROTOMY  01-05-2006    dr Rise Patience @[facility]   . [place] Anes  09-25-2009   dr Milinda Pointer @[facility]    right great toe  . CARPAL TUNNEL RELEASE Bilateral 1996;  1997   . CESAREAN SECTION  1988  . FOOT SURGERY  12/ 2014   dr Milinda Pointer   left 2nd hammertoe repair,  left 2nd metatarsal matrixectomy, left heel endoscopic plantar fasiotomy  . PANNICULECTOMY N/A 10/26/2018   Procedure: PANNICULECTOMY;  Surgeon: Johnathan Hausen, MD;  Location: [facility];  Service: General;  Laterality: N/A;  . TENDON REPAIR  09/2015   left posterior tibial and peroneal tendon repairs  . TOE AMPUTATION  01/2015   right 2nd toe  . TONSILLECTOMY  age 2  . TRANSOBTURATOR SLING  01-08-2004   dr Jeffie Pollock @[facility]     Social History   Socioeconomic History  . Marital status: Divorced    Spouse name: Not on file  . Number of children: Not on file  . Years of education: Not on file  . Highest education level: Not on file  Occupational History  . Not on file  Social Needs  . Financial resource strain: Not on file  . Food insecurity:    Worry: Not on file    Inability: Not on file  . Transportation needs:    Medical: Not on file    Non-medical: Not on file  Tobacco Use  . Smoking status: Never Smoker  . Smokeless tobacco: Never Used  Substance and Sexual Activity  . Alcohol use: No  . Drug use: Never  . Sexual activity: Not on file  Lifestyle  . Physical activity:    Days per week: Not on file    Minutes per session: Not on file  . Stress: Not on file  Relationships  . Social connections:    Talks on phone: Not on file    Gets together: Not on file    Attends religious service: Not on file    Active member of club or organization: Not on file    Attends meetings of clubs or organizations: Not on file    Relationship status: Not on file  . Intimate partner violence:    Fear of current or ex partner: Not on file    Emotionally abused: Not on file    Physically abused: Not on file    Forced sexual activity: Not on file  Other Topics Concern  . Not on file  Social History Narrative  . Not on file    Family History  Problem Relation Age of Onset  . Diabetes Mother    . Migraines Mother   . Diabetes Sister     Current Outpatient Medications  Medication Sig Dispense Refill  . albuterol (PROVENTIL HFA;VENTOLIN HFA) 108 (90 BASE) MCG/ACT inhaler Inhale 1-2 puffs into the lungs every 6 (  six) hours as needed for wheezing or shortness of breath.    . ALPRAZolam (XANAX) 1 MG tablet Take 1 mg by mouth 3 (three) times daily as needed for anxiety.     . Continuous Blood Gluc Sensor (FREESTYLE LIBRE 14 DAY SENSOR) MISC USE AS DIRECTED EVERY 2 WEEKS SUBCUTANEOUSLY  4  . cyclobenzaprine (FLEXERIL) 10 MG tablet Take 1 tablet (10 mg total) by mouth 3 (three) times daily. (Patient taking differently: Take 10 mg by mouth 3 (three) times daily as needed for muscle spasms. ) 90 tablet 3  . DULoxetine (CYMBALTA) 60 MG capsule Take 120 mg by mouth at bedtime.    Marland Kitchen estradiol (ESTRACE) 0.5 MG tablet Take 0.5 mg by mouth every evening.     . fenofibrate micronized (LOFIBRA) 134 MG capsule Take 134 mg by mouth every evening.     . folic acid (FOLVITE) 1 MG tablet Take 2 mg by mouth daily.     Marland Kitchen gabapentin (NEURONTIN) 400 MG capsule TAKE 1 CAPSULE BY MOUTH 3 TIMES DAILY. (Patient taking differently: Take 400 mg by mouth 3 (three) times daily. ) 270 capsule 3  . hydrochlorothiazide (HYDRODIURIL) 25 MG tablet Take 25 mg by mouth daily.    . hydroxychloroquine (PLAQUENIL) 200 MG tablet Take 400 mg by mouth at bedtime.     . insulin glargine (LANTUS SOLOSTAR) 100 UNIT/ML injection Inject 25 Units into the skin 2 (two) times daily. Per [place]    . insulin lispro (HUMALOG KWIKPEN) 100 UNIT/ML injection Inject 13 Units into the skin 3 (three) times daily with meals. 12-15 units sliding scale    . levocetirizine (XYZAL) 5 MG tablet Take 5 mg by mouth every evening.    Marland Kitchen levothyroxine (SYNTHROID, LEVOTHROID) 50 MCG tablet Take 50 mcg by mouth daily before breakfast.     . losartan (COZAAR) 100 MG tablet Take 100 mg by mouth daily.    . metFORMIN (GLUCOPHAGE-XR) 500 MG 24 hr tablet Take 1,000  mg by mouth at bedtime.     . methotrexate 25 MG/ML SOLN Inject 20 mg into the muscle every Thursday. Inject 0.8 mL once weekly at bedtime. Every Thursday    . Methotrexate, Anti-Rheumatic, (METHOTREXATE, PF, ) Inject 0.8 mLs into the skin once a week. thursday's    . montelukast (SINGULAIR) 10 MG tablet Take 10 mg by mouth at bedtime.    . mupirocin ointment (BACTROBAN) 2 % Apply 1 application topically 3 (three) times daily.   1  . nystatin-triamcinolone ointment (MYCOLOG) Apply 1 application topically 2 (two) times daily.     Marland Kitchen omeprazole (PRILOSEC) 40 MG capsule Take 40 mg by mouth every evening.     Marland Kitchen oxyCODONE (OXY IR/ROXICODONE) 5 MG immediate release tablet Take 1 tablet (5 mg total) by mouth every 6 (six) hours as needed for moderate pain, severe pain or breakthrough pain. 15 tablet 0  . oxyCODONE (OXY IR/ROXICODONE) 5 MG immediate release tablet Take 1 tablet (5 mg total) by mouth every 6 (six) hours as needed for moderate pain, severe pain or breakthrough pain. 20 tablet 0  . traMADol (ULTRAM) 50 MG tablet Take 1 tablet (50 mg total) by mouth every 8 (eight) hours as needed. (Patient taking differently: Take 50 mg by mouth at bedtime. ) 30 tablet 0  . UNIFINE PENTIPS 31G X 5 MM MISC USE AS DIRECTED 5 TIMES PER DAY  4  . valACYclovir (VALTREX) 1000 MG tablet Take 1,000 mg by mouth 2 (two) times daily as needed (fever  blisters).   3  . zolpidem (AMBIEN CR) 12.5 MG CR tablet Take 12.5 mg by mouth at bedtime.      No current facility-administered medications for this visit.      Allergies  Allergen Reactions  . Dilaudid [Hydromorphone Hcl] Nausea And Vomiting    Hard to wake up  . Doxycycline Nausea And Vomiting  . Topiramate Other (See Comments)    Suicidal thoughts per patient  . Trazodone And Nefazodone Other (See Comments)    Hallucinations/ suicidal thoughts    @VS @  No results found.  Note: This dictation was prepared with Dragon/digital dictation along with  [employer]. Any transcriptional errors that result from this process are unintentional.   .Adin Hector, M.D., F.A.C.S. Gastrointestinal and Minimally Invasive Surgery [facility] [address], [place], [place] [address] [phone] Main / Paging  11/07/2018 12:00 PM

## 2018-11-07 NOTE — ED Notes (Signed)
Patient transported to CT 

## 2018-11-08 MED FILL — CEPHALEXIN 500 MG CAPSULE: 500 | 5 days supply | Qty: 20 | Fill #0

## 2018-11-09 ENCOUNTER — Other Ambulatory Visit (HOSPITAL_COMMUNITY): Payer: Self-pay | Admitting: Surgery

## 2018-11-09 ENCOUNTER — Other Ambulatory Visit: Payer: Self-pay | Admitting: Surgery

## 2018-11-09 DIAGNOSIS — S301XXA Contusion of abdominal wall, initial encounter: Secondary | ICD-10-CM

## 2018-11-10 LAB — URINE CULTURE

## 2018-11-11 ENCOUNTER — Telehealth: Payer: Self-pay

## 2018-11-11 NOTE — Telephone Encounter (Signed)
Post ED Visit - Positive Culture Follow-up  Culture report reviewed by antimicrobial stewardship pharmacist:  []  Elenor Quinones, Pharm.D. []  Heide Guile, Pharm.D., BCPS AQ-ID [x]  Parks Neptune, Pharm.D., BCPS []  Alycia Rossetti, Pharm.D., BCPS []  [place], Pharm.D., BCPS, AAHIVP []  Legrand Como, Pharm.D., BCPS, AAHIVP []  Salome Arnt, PharmD, BCPS []  Johnnette Gourd, PharmD, BCPS []  Hughes Better, PharmD, BCPS []  Leeroy Cha, PharmD  Positive urine culture Treated with Cephalexin, organism sensitive to the same and no further patient follow-up is required at this time.  Genia Del 11/11/2018, 10:03 AM

## 2018-11-12 ENCOUNTER — Other Ambulatory Visit: Payer: Self-pay | Admitting: Radiology

## 2018-11-13 ENCOUNTER — Encounter (HOSPITAL_COMMUNITY): Payer: Self-pay

## 2018-11-13 ENCOUNTER — Ambulatory Visit (HOSPITAL_COMMUNITY)
Admission: RE | Admit: 2018-11-13 | Discharge: 2018-11-13 | Disposition: A | Discharge status: Home / Self Care | Payer: 59 | Source: Ambulatory Visit | Attending: Surgery | Admitting: Surgery

## 2018-11-13 DIAGNOSIS — S301XXA Contusion of abdominal wall, initial encounter: Secondary | ICD-10-CM | POA: Diagnosis not present

## 2018-11-13 DIAGNOSIS — L7633 Postprocedural seroma of skin and subcutaneous tissue following a dermatologic procedure: Secondary | ICD-10-CM | POA: Diagnosis not present

## 2018-11-13 LAB — PROTIME-INR
INR: 0.97
Prothrombin Time: 12.8 seconds (ref 11.4–15.2)

## 2018-11-13 LAB — CBC
HCT: 38.7 % (ref 36.0–46.0)
Hemoglobin: 12.3 g/dL (ref 12.0–15.0)
MCH: 27.7 pg (ref 26.0–34.0)
MCHC: 31.8 g/dL (ref 30.0–36.0)
MCV: 87.2 fL (ref 80.0–100.0)
PLATELETS: 385 10*3/uL (ref 150–400)
RBC: 4.44 MIL/uL (ref 3.87–5.11)
RDW: 12.5 % (ref 11.5–15.5)
WBC: 6.4 10*3/uL (ref 4.0–10.5)
nRBC: 0 % (ref 0.0–0.2)

## 2018-11-13 LAB — BASIC METABOLIC PANEL
Anion gap: 10 (ref 5–15)
BUN: 10 mg/dL (ref 6–20)
CALCIUM: 9.3 mg/dL (ref 8.9–10.3)
CHLORIDE: 102 mmol/L (ref 98–111)
CO2: 28 mmol/L (ref 22–32)
CREATININE: 0.95 mg/dL (ref 0.44–1.00)
GFR calc Af Amer: >60 mL/min (ref >60)
GFR calc non Af Amer: >60 mL/min (ref >60)
Glucose, Bld: 137 mg/dL — ABNORMAL HIGH (ref 70–99)
Potassium: 3.9 mmol/L (ref 3.5–5.1)
Sodium: 140 mmol/L (ref 135–145)

## 2018-11-13 MED ORDER — LIDOCAINE HCL (PF) 1 % IJ SOLN
INTRAMUSCULAR | Status: AC
  Filled 2018-11-13: qty 30

## 2018-11-13 MED ORDER — SODIUM CHLORIDE 0.9 % IV SOLN: INTRAVENOUS | Status: DC

## 2018-11-13 MED ORDER — MIDAZOLAM HCL 2 MG/2ML IJ SOLN
INTRAMUSCULAR | Status: AC
  Filled 2018-11-13: qty 2

## 2018-11-13 MED ORDER — FENTANYL CITRATE (PF) 100 MCG/2ML IJ SOLN
INTRAMUSCULAR | Status: AC | PRN
  Administered 2018-11-13 (×2): 25 ug via INTRAVENOUS
  Administered 2018-11-13: 50 ug via INTRAVENOUS

## 2018-11-13 MED ORDER — FENTANYL CITRATE (PF) 100 MCG/2ML IJ SOLN
INTRAMUSCULAR | Status: AC
  Filled 2018-11-13: qty 2

## 2018-11-13 MED ORDER — MIDAZOLAM HCL 2 MG/2ML IJ SOLN
INTRAMUSCULAR | Status: AC | PRN
  Administered 2018-11-13 (×2): 0.5 mg via INTRAVENOUS
  Administered 2018-11-13: 1 mg via INTRAVENOUS

## 2018-11-13 MED ORDER — SODIUM CHLORIDE 0.9 % IV SOLN
INTRAVENOUS | Status: AC | PRN
  Administered 2018-11-13: 10 mL/h via INTRAVENOUS

## 2018-11-13 NOTE — H&P (Signed)
Chief Complaint: Patient was seen in consultation today for abdominal seroma aspiration/drain at the request of Martin,Matthew  Referring Physician(s): Martin,Matthew  Supervising Physician: Markus Daft  Patient Status: [facility] - Out-pt  History of Present Illness: Amy Adams is a 52 y.o. female   Panniculectomy 10/26/18- Dr Rockne Coons Post seroma development Spontaneous drainage off and on  CT 12/25:  IMPRESSION: 1. Question of mild wall thickening along the base of the bladder on sagittal images. Given the patient's hematuria, cystoscopy would be helpful for further evaluation, as deemed clinically appropriate. 2. Large amount of postoperative fluid along the anterior abdominal wall, with trace air, reflecting recent panniculectomy. 3. Left renal cyst noted. 4. Splenomegaly. 5. Circumaortic left renal vein incidentally noted.  Now scheduled for seroma aspiration Possible drain placement   Past Medical History:  Diagnosis Date  . Anxiety   . Arthritis   . Chronic foot pain, left   . GERD (gastroesophageal reflux disease)   . History of anal fissures   . Hyperlipidemia   . Hypertension   . Hypothyroidism   . Insulin dependent type 2 diabetes mellitus [facility])    endrocrinologist-- dr Chalmers Cater  . Migraines   . Peripheral neuropathy   . RA (rheumatoid arthritis) [facility])    rheumatologist-  dr Amil Amen  . SUI (stress urinary incontinence, female)   . Wears glasses     Past Surgical History:  Procedure Laterality Date  . ABDOMINAL HYSTERECTOMY  1990   with Right Salpingo--ophorectomy  . ANAL SPHINCTEROTOMY  01-05-2006    dr weatherly @[facility]   . BUNIONECTOMY  09-25-2009   dr Milinda Pointer @[facility]    right great toe  . CARPAL TUNNEL RELEASE Bilateral 1996;  1997  . CESAREAN SECTION  1988  . FOOT SURGERY  12/ 2014   dr Milinda Pointer   left 2nd hammertoe repair,  left 2nd metatarsal matrixectomy, left heel endoscopic plantar fasiotomy  . PANNICULECTOMY N/A 10/26/2018   Procedure:  PANNICULECTOMY;  Surgeon: Johnathan Hausen, MD;  Location: [facility]S;  Service: General;  Laterality: N/A;  . TENDON REPAIR  09/2015   left posterior tibial and peroneal tendon repairs  . TOE AMPUTATION  01/2015   right 2nd toe  . TONSILLECTOMY  age 71  . TRANSOBTURATOR SLING  01-08-2004   dr Jeffie Pollock @[facility]     Allergies: Dilaudid [hydromorphone hcl]; Doxycycline; Topiramate; and Trazodone and nefazodone  Medications: Prior to Admission medications   Medication Sig Start Date End Date Taking? Authorizing Provider  acetaminophen (TYLENOL) 500 MG tablet Take 500 mg by mouth every 6 (six) hours as needed for fever.   Yes [provider]  albuterol (PROVENTIL HFA;VENTOLIN HFA) 108 (90 BASE) MCG/ACT inhaler Inhale 1-2 puffs into the lungs every 6 (six) hours as needed for wheezing or shortness of breath.   Yes [provider]  cyclobenzaprine (FLEXERIL) 10 MG tablet Take 1 tablet (10 mg total) by mouth 3 (three) times daily. Patient taking differently: Take 10 mg by mouth 3 (three) times daily as needed for muscle spasms.  07/10/18  Yes Hyatt, Max T, DPM  DULoxetine (CYMBALTA) 60 MG capsule Take 120 mg by mouth at bedtime.   Yes [provider]  estradiol (ESTRACE) 0.5 MG tablet Take 0.5 mg by mouth every evening.    Yes [provider]  fenofibrate micronized (LOFIBRA) 134 MG capsule Take 134 mg by mouth every evening.    Yes [provider]  folic acid (FOLVITE) 1 MG tablet Take 2 mg by mouth daily.  Yes [provider]  gabapentin (NEURONTIN) 400 MG capsule TAKE 1 CAPSULE BY MOUTH 3 TIMES DAILY. Patient taking differently: Take 400 mg by mouth 3 (three) times daily.  01/31/18  Yes Hyatt, Max T, DPM  hydroxychloroquine (PLAQUENIL) 200 MG tablet Take 400 mg by mouth at bedtime.    Yes [provider]  insulin glargine (LANTUS SOLOSTAR) 100 UNIT/ML injection Inject 25 Units into the skin 2 (two) times daily. Per [place]   Yes [provider]  insulin lispro (HUMALOG KWIKPEN) 100 UNIT/ML injection Inject 13 Units into the skin 3 (three) times daily with meals. 12-15 units sliding scale   Yes [provider]  levocetirizine (XYZAL) 5 MG tablet Take 5 mg by mouth every evening.   Yes [provider]  levothyroxine (SYNTHROID, LEVOTHROID) 50 MCG tablet Take 50 mcg by mouth daily before breakfast.    Yes [provider]  losartan (COZAAR) 100 MG tablet Take 100 mg by mouth daily.   Yes [provider]  metFORMIN (GLUCOPHAGE-XR) 500 MG 24 hr tablet Take 1,000 mg by mouth at bedtime.    Yes [provider]  methotrexate 25 MG/ML SOLN Inject 20 mg into the muscle every Thursday. Inject 0.8 mL once weekly at bedtime. Every Thursday   Yes [provider]  Methotrexate, Anti-Rheumatic, (METHOTREXATE, PF, [place]) Inject 0.8 mLs into the skin once a week. thursday's   Yes [provider]  montelukast (SINGULAIR) 10 MG tablet Take 10 mg by mouth at bedtime.   Yes [provider]  oxyCODONE (OXY IR/ROXICODONE) 5 MG immediate release tablet Take 1 tablet (5 mg total) by mouth every 6 (six) hours as needed for moderate pain, severe pain or breakthrough pain. 10/27/18  Yes Hoxworth, Marland Kitchen, MD  oxyCODONE (OXY IR/ROXICODONE) 5 MG immediate release tablet Take 1 tablet (5 mg total) by mouth every 6 (six) hours as needed for moderate pain, severe pain or breakthrough pain. 10/27/18  Yes Hoxworth, Marland Kitchen, MD  traMADol (ULTRAM) 50 MG tablet Take 1 tablet (50 mg total) by mouth every 8 (eight) hours as needed. Patient taking differently: Take 50 mg by mouth at bedtime.  12/13/16  Yes Hyatt, Max T, DPM  zolpidem (AMBIEN CR) 12.5 MG CR tablet Take 12.5 mg by mouth at bedtime.    Yes [provider]  ALPRAZolam Duanne Moron) 1 MG tablet Take 1 mg by mouth 3 (three) times daily as needed for anxiety.     [provider]  Continuous Blood Gluc Sensor (FREESTYLE LIBRE 14  DAY SENSOR) MISC USE AS DIRECTED EVERY 2 WEEKS SUBCUTANEOUSLY 05/24/18   [provider]  hydrochlorothiazide (HYDRODIURIL) 25 MG tablet Take 25 mg by mouth daily.    [provider]  mupirocin ointment (BACTROBAN) 2 % Apply 1 application topically 3 (three) times daily.  05/24/18   [provider]  nystatin-triamcinolone ointment (MYCOLOG) Apply 1 application topically 2 (two) times daily.  01/31/18   [provider]  omeprazole (PRILOSEC) 40 MG capsule Take 40 mg by mouth every evening.     [provider]  UNIFINE PENTIPS 31G X 5 MM MISC USE AS DIRECTED 5 TIMES PER DAY 06/19/18   [provider]  valACYclovir (VALTREX) 1000 MG tablet Take 1,000 mg by mouth 2 (two) times daily as needed (fever blisters).  05/01/18   [provider]     Family History  Problem Relation Age of Onset  . Diabetes Mother   . Migraines Mother   . Diabetes  Sister     Social History   Socioeconomic History  . Marital status: Divorced    Spouse name: Not on file  . Number of children: Not on file  . Years of education: Not on file  . Highest education level: Not on file  Occupational History  . Not on file  Social Needs  . Financial resource strain: Not on file  . Food insecurity:    Worry: Not on file    Inability: Not on file  . Transportation needs:    Medical: Not on file    Non-medical: Not on file  Tobacco Use  . Smoking status: Never Smoker  . Smokeless tobacco: Never Used  Substance and Sexual Activity  . Alcohol use: No  . Drug use: Never  . Sexual activity: Not on file  Lifestyle  . Physical activity:    Days per week: Not on file    Minutes per session: Not on file  . Stress: Not on file  Relationships  . Social connections:    Talks on phone: Not on file    Gets together: Not on file    Attends religious service: Not on file    Active member of club or organization: Not on file    Attends meetings of clubs or  organizations: Not on file    Relationship status: Not on file  Other Topics Concern  . Not on file  Social History Narrative  . Not on file     Review of Systems: A 12 point ROS discussed and pertinent positives are indicated in the HPI above.  All other systems are negative.  Review of Systems  Constitutional: Positive for activity change. Negative for fatigue and fever.  Respiratory: Negative for shortness of breath.   Cardiovascular: Negative for chest pain.  Gastrointestinal: Positive for abdominal pain.  Neurological: Negative for weakness.  Psychiatric/Behavioral: Negative for behavioral problems and confusion.    Vital Signs: BP 116/74   Pulse 75   Temp 98.1 F (36.7 C)   Resp 20   Ht 5\' 9"  (1.753 m)   Wt 200 lb (90.7 kg)   SpO2 91%   BMI 29.53 kg/m   Physical Exam Vitals signs reviewed.  Cardiovascular:     Rate and Rhythm: Normal rate and regular rhythm.  Pulmonary:     Effort: Pulmonary effort is normal.     Breath sounds: Normal breath sounds.  Abdominal:     General: Bowel sounds are normal. There is distension.     Tenderness: There is abdominal tenderness.  Musculoskeletal: Normal range of motion.  Skin:    General: Skin is warm and dry.     Comments: Healing surgical scar  Neurological:     General: No focal deficit present.     Mental Status: She is oriented to person, place, and time.  Psychiatric:        Mood and Affect: Mood normal.        Behavior: Behavior normal.        Thought Content: Thought content normal.        Judgment: Judgment normal.     Imaging: Ct Abdomen Pelvis W Contrast  Result Date: 11/07/2018 CLINICAL DATA:  Acute onset of hematuria. EXAM: CT ABDOMEN AND PELVIS WITH CONTRAST TECHNIQUE: Multidetector CT imaging of the abdomen and pelvis was performed using the standard protocol following bolus administration of intravenous contrast. CONTRAST:  165mL ISOVUE-300 IOPAMIDOL (ISOVUE-300) INJECTION 61% COMPARISON:  CT of  the abdomen and pelvis from  01/11/2010, and MRI of the lumbar spine performed 11/24/2006 FINDINGS: Lower chest: Mild scarring is noted at the lung bases. The visualized portions of the mediastinum are unremarkable. Hepatobiliary: The liver is unremarkable in appearance. The gallbladder is unremarkable in appearance. The common bile duct remains normal in caliber. Pancreas: The pancreas is within normal limits. Spleen: The spleen is enlarged, measuring 15.5 cm in length. Adrenals/Urinary Tract: The adrenal glands are unremarkable in appearance. Nonspecific perinephric stranding is noted bilaterally. A left renal cyst is noted. There is no evidence of hydronephrosis. No renal or ureteral stones are identified. Stomach/Bowel: The stomach is unremarkable in appearance. The small bowel is within normal limits. The appendix is normal in caliber, without evidence of appendicitis. The colon is unremarkable in appearance. Vascular/Lymphatic: The abdominal aorta is unremarkable in appearance. A circumaortic left renal vein is noted. The inferior vena cava is grossly unremarkable. No retroperitoneal lymphadenopathy is seen. No pelvic sidewall lymphadenopathy is identified. Reproductive: There is question of mild wall thickening along the base of the bladder on sagittal images. The bladder is otherwise unremarkable. The patient is status post hysterectomy. No suspicious adnexal masses are seen. Other: A large amount of postoperative fluid is noted along the anterior abdominal wall, with trace air, reflecting recent panniculectomy. Musculoskeletal: No acute osseous abnormalities are identified. The visualized musculature is unremarkable in appearance. IMPRESSION: 1. Question of mild wall thickening along the base of the bladder on sagittal images. Given the patient's hematuria, cystoscopy would be helpful for further evaluation, as deemed clinically appropriate. 2. Large amount of postoperative fluid along the anterior abdominal  wall, with trace air, reflecting recent panniculectomy. 3. Left renal cyst noted. 4. Splenomegaly. 5. Circumaortic left renal vein incidentally noted. Electronically Signed   By: Garald Balding M.D.   On: 11/07/2018 21:11    Labs:  CBC: Recent Labs    10/22/18 1620 10/26/18 1550 10/27/18 0315 11/07/18 1948  WBC 7.9 10.5 10.4 14.6*  HGB 14.1 13.1 12.2 11.6*  HCT 42.1 39.4 37.1 35.4*  PLT 243 212 228 219    COAGS: No results for input(s): INR, APTT in the last 8760 hours.  BMP: Recent Labs    10/22/18 1620 10/26/18 1550 10/27/18 0315 11/07/18 1948  NA 138  --  138 136  K 4.1  --  4.0 3.4*  CL 99  --  99 98  CO2 28  --  26 25  GLUCOSE 122*  --  191* 137*  BUN 16  --  19 27*  CALCIUM 9.3  --  9.0 9.0  CREATININE 0.86 1.26* 1.05* 1.23*  GFRNONAA >60 48* >60 50*  GFRAA >60 56* >60 58*    LIVER FUNCTION TESTS: No results for input(s): BILITOT, AST, ALT, ALKPHOS, PROT, ALBUMIN in the last 8760 hours.  TUMOR MARKERS: No results for input(s): AFPTM, CEA, CA199, CHROMGRNA in the last 8760 hours.  Assessment and Plan:  Panniculectomy 10/26/18 Seroma development Now for aspiration/drain placement Risks and benefits discussed with the patient including, but not limited to bleeding, infection, damage to adjacent structures or low yield requiring additional tests.  All of the patient's questions were answered, patient is agreeable to proceed. Consent signed and in chart.   Thank you for this interesting consult.  I greatly enjoyed meeting Amy Adams and look forward to participating in their care.  A copy of this report was sent to the requesting provider on this date.  Electronically Signed: Lavonia Drafts, PA-C 11/13/2018, 12:42 PM   I spent  a total of  30 Minutes   in face to face in clinical consultation, greater than 50% of which was counseling/coordinating care for seroma aspiration

## 2018-11-13 NOTE — Discharge Instructions (Addendum)
Surgical Drain Record Empty your surgical drain as told by your health care provider. Use this form to write down the amount of fluid that has collected in the drainage container. Bring this form with you to your follow-up visits. Surgical drain #1 location: ___________________  Date __________ Time __________ Amount __________ Date __________ Time __________ Amount __________ Date __________ Time __________ Amount __________ Date __________ Time __________ Amount __________ Date __________ Time __________ Amount __________ Date __________ Time __________ Amount __________ Date __________ Time __________ Amount __________ Date __________ Time __________ Amount __________ Date __________ Time __________ Amount __________ Date __________ Time __________ Amount __________ Date __________ Time __________ Amount __________ Date __________ Time __________ Amount __________ Date __________ Time __________ Amount __________ Date __________ Time __________ Amount __________ Date __________ Time __________ Amount __________ Date __________ Time __________ Amount __________ Date __________ Time __________ Amount __________ Date __________ Time __________ Amount __________ Date __________ Time __________ Amount __________ Date __________ Time __________ Amount __________ Date __________ Time __________ Amount __________ Surgical drain #2 location: ___________________ Date __________ Time __________ Amount __________ Date __________ Time __________ Amount __________ Date __________ Time __________ Amount __________ Date __________ Time __________ Amount __________ Date __________ Time __________ Amount __________ Date __________ Time __________ Amount __________ Date __________ Time __________ Amount __________ Date __________ Time __________ Amount __________ Date __________ Time __________ Amount __________ Date __________ Time __________ Amount __________ Date __________ Time __________  Amount __________ Date __________ Time __________ Amount __________ Date __________ Time __________ Amount __________ Date __________ Time __________ Amount __________ Date __________ Time __________ Amount __________ Date __________ Time __________ Amount __________ Date __________ Time __________ Amount __________ Date __________ Time __________ Amount __________ Date __________ Time __________ Amount __________ Date __________ Time __________ Amount __________ Date __________ Time __________ Amount __________ This information is not intended to replace advice given to you by your health care provider. Make sure you discuss any questions you have with your health care provider. Document Released: 08/07/2017 Document Revised: 08/07/2017 Document Reviewed: 08/07/2017 Elsevier Interactive Patient Education  2019 [place] Surgical drains are used to remove extra fluid that normally builds up in a surgical wound after surgery. A surgical drain helps to heal a surgical wound. Different kinds of surgical drains include:  Active drains. These drains use suction to pull drainage away from the surgical wound. Drainage flows through a tube to a container outside of the body. It is important to keep the bulb or the drainage container flat (compressed) at all times, except while you empty it. Flattening the bulb or container creates suction. The two most common types of active drains are bulb drains and Hemovac drains.  Passive drains. These drains allow fluid to drain naturally, by gravity. Drainage flows through a tube to a bandage (dressing) or a container outside of the body. Passive drains do not need to be emptied. The most common type of passive drain is the Penrose drain. A drain is placed during surgery. Immediately after surgery, drainage is usually bright red and a little thicker than water. The drainage may gradually turn yellow or pink and become thinner. It is  likely that your health care provider will remove the drain when the drainage stops or when the amount decreases to 1-2 Tbsp (15-30 mL) during a 24-hour period. How to care for your surgical drain It is important to care for your drain to prevent infection. If your drain is placed at your back, or any other hard-to-reach area, ask another person to  assist you in performing the following steps:  Keep the skin around the drain dry and covered with a dressing at all times.  Check your drain area every day for signs of infection. Check for: ? More redness, swelling, or pain. ? Pus or a bad smell. ? Cloudy drainage. Follow instructions from your health care provider about how to take care of your drain and how to change your dressing. Change your dressing at least one time every day. Change it more often if needed to keep the dressing dry. Make sure you: 1. Gather your supplies, including: ? Tape. ? Germ-free cleaning solution (sterile saline). ? Split gauze drain sponge: 4 x 4 inches (10 x 10 cm). ? Gauze square: 4 x 4 inches (10 x 10 cm). 2. Wash your hands with soap and water before you change your dressing. If soap and water are not available, use hand sanitizer. 3. Remove the old dressing. Avoid using scissors to do that. 4. Use sterile saline to clean your skin around the drain. 5. Place the tube through the slit in a drain sponge. Place the drain sponge so that it covers your wound. 6. Place the gauze square or another drain sponge on top of the drain sponge that is on the wound. Make sure the tube is between those layers. 7. Tape the dressing to your skin. 8. If you have an active bulb or Hemovac drain, tape the drainage tube to your skin 1-2 inches (2.5-5 cm) below the place where the tube enters your body. Taping keeps the tube from pulling on any stitches (sutures) that you have. 9. Wash your hands with soap and water. 10. Write down the color of your drainage and how often you change  your dressing. How to empty your active bulb or Hemovac drain  1. Make sure that you have a measuring cup that you can empty your drainage into. 2. Wash your hands with soap and water. If soap and water are not available, use hand sanitizer. 3. Gently move your fingers down the tube while squeezing very lightly. This is called stripping the tube. This clears any drainage, clots, or tissue from the tube. ? Do not pull on the tube. ? You may need to strip the tube several times every day to keep the tube clear. 4. Open the bulb cap or the drain plug. Do not touch the inside of the cap or the bottom of the plug. 5. Empty all of the drainage into the measuring cup. 6. Compress the bulb or the container and replace the cap or the plug. To compress the bulb or the container, squeeze it firmly in the middle while you close the cap or plug the container. 7. Write down the amount of drainage that you have in each 24-hour period. If you have less than 2 Tbsp (30 mL) of drainage during 24 hours, contact your health care provider. 8. Flush the drainage down the toilet. 9. Wash your hands with soap and water. Contact a health care provider if:  You have more redness, swelling, or pain around your drain area.  The amount of drainage that you have is increasing instead of decreasing.  You have pus or a bad smell coming from your drain area.  You have a fever.  You have drainage that is cloudy.  There is a sudden stop or a sudden decrease in the amount of drainage that you have.  Your tube falls out.  Your active draindoes not stay compressedafter you  empty it. Summary  Surgical drains are used to remove extra fluid that normally builds up in a surgical wound after surgery.  Different kinds of surgical drains include active drains and passive drains. Active drains use suction to pull drainage away from the surgical wound, and passive drains allow fluid to drain naturally.  It is important to  care for your drain to prevent infection. If your drain is placed at your back, or any other hard-to-reach area, ask another person to assist you.  Contact your health care provider if you have more redness, swelling, or pain around your drain area. This information is not intended to replace advice given to you by your health care provider. Make sure you discuss any questions you have with your health care provider. Document Released: 10/28/2000 Document Revised: 11/23/2017 Document Reviewed: 05/20/2015 Elsevier Interactive Patient Education  2019 [employer] Seroma A seroma is a collection of fluid on the body that looks like swelling or a mass. Seromas form where tissue has been injured or cut. Seromas vary in size. Some are small and painless. Others may become large and cause pain or discomfort. Many seromas go away on their own as the fluid is naturally absorbed by the body, and some seromas need to be drained. What are the causes? Seromas form as the result of damage to tissue or the removal of tissue. This tissue damage may occur during surgery or because of an injury or trauma. When tissue is disrupted or removed, empty space is created. The bodys natural defense system (immune system) causes fluid to enter the empty space and form a seroma. What are the signs or symptoms? Symptoms of this condition include:  Swelling at the site of a surgical cut (incision) or an injury.  Drainage of clear fluid at the surgery or injury site.  Discomfort or pain. How is this diagnosed? This condition is diagnosed based on your symptoms, your medical history, and a physical exam. During the exam, your health care provider will press on the seroma. You may also have tests, including:  Blood tests.  Imaging tests, such as an ultrasound or CT scan. How is this treated? Some seromas go away (resolve) on their own. Your health care provider may monitor you to make sure the seroma does not cause any  complications. If your seroma does not resolve on its own, treatment may include:  Using a needle to drain the fluid from the seroma (needle aspiration).  Inserting a flexible tube (catheter) to drain the fluid.  Applying a bandage (dressing), such as an elastic bandage or binder.  Antibiotic medicines, if the seroma becomes infected. In rare cases, surgery may be done to remove the seroma and repair the area. Follow these instructions at home:   If you were prescribed an antibiotic medicine, take it as told by your health care provider. Do not stop taking the antibiotic even if you start to feel better.  Return to your normal activities as told by your health care provider. Ask your health care provider what activities are safe for you.  Take over-the-counter and prescription medicines only as told by your health care provider.  Check your seroma every day for signs of infection. Check for: ? Redness or pain. ? Fluid or pus. ? More swelling. ? Warmth.  Keep all follow-up visits as told by your health care provider. This is important. Contact a health care provider if:  You have a fever.  You have redness or pain at the  site of the seroma.  You have fluid or pus coming from the seroma.  Your seroma is more swollen or is getting bigger.  Your seroma is warm to the touch. This information is not intended to replace advice given to you by your health care provider. Make sure you discuss any questions you have with your health care provider. Document Released: 02/25/2013 Document Revised: 08/12/2016 Document Reviewed: 08/12/2016 Elsevier Interactive Patient Education  2019 [place]. Moderate Conscious Sedation, Adult, Care After These instructions provide you with information about caring for yourself after your procedure. Your health care provider may also give you more specific instructions. Your treatment has been planned according to current medical practices, but  problems sometimes occur. Call your health care provider if you have any problems or questions after your procedure. What can I expect after the procedure? After your procedure, it is common:  To feel sleepy for several hours.  To feel clumsy and have poor balance for several hours.  To have poor judgment for several hours.  To vomit if you eat too soon. Follow these instructions at home: For at least 24 hours after the procedure:   Do not: ? Participate in activities where you could fall or become injured. ? Drive. ? Use heavy machinery. ? Drink alcohol. ? Take sleeping pills or medicines that cause drowsiness. ? Make important decisions or sign legal documents. ? Take care of children on your own.  Rest. Eating and drinking  Follow the diet recommended by your health care provider.  If you vomit: ? Drink water, juice, or soup when you can drink without vomiting. ? Make sure you have little or no nausea before eating solid foods. General instructions  Have a responsible adult stay with you until you are awake and alert.  Take over-the-counter and prescription medicines only as told by your health care provider.  If you smoke, do not smoke without supervision.  Keep all follow-up visits as told by your health care provider. This is important. Contact a health care provider if:  You keep feeling nauseous or you keep vomiting.  You feel light-headed.  You develop a rash.  You have a fever. Get help right away if:  You have trouble breathing. This information is not intended to replace advice given to you by your health care provider. Make sure you discuss any questions you have with your health care provider. Document Released: 08/21/2013 Document Revised: 04/04/2016 Document Reviewed: 02/20/2016 Elsevier Interactive Patient Education  2019 [employer].

## 2018-11-13 NOTE — Progress Notes (Signed)
JP drain draining serosanguinous fluid and emptied 100cc

## 2018-11-13 NOTE — Procedures (Signed)
Interventional Radiology Procedure:   Indications: Post operative seroma  Procedure: US guided drain placement  Findings: Placed 10 Fr drain and removed 100 ml of red serous fluid  Complications None     EBL: None  Plan: Follow up with surgery.  Will schedule drain clinic follow up in 2 weeks.   Shantele Reller R. Anselm Pancoast, MD  Pager: [phone]

## 2018-11-15 ENCOUNTER — Other Ambulatory Visit: Payer: Self-pay | Admitting: Diagnostic Radiology

## 2018-11-15 ENCOUNTER — Other Ambulatory Visit: Payer: Self-pay | Admitting: Surgery

## 2018-11-15 DIAGNOSIS — S301XXA Contusion of abdominal wall, initial encounter: Secondary | ICD-10-CM

## 2018-11-21 ENCOUNTER — Other Ambulatory Visit: Payer: 59

## 2018-11-23 MED FILL — AMOX-CLAV 500-125 MG TABLET: 500-125 | 14 days supply | Qty: 28 | Fill #0

## 2018-11-23 MED FILL — FLUCONAZOLE 150 MG TABS: 150 | 1 days supply | Qty: 1 | Fill #0

## 2018-11-28 ENCOUNTER — Encounter: Payer: Self-pay | Admitting: *Deleted

## 2018-11-28 ENCOUNTER — Ambulatory Visit
Admission: RE | Admit: 2018-11-28 | Discharge: 2018-11-28 | Disposition: A | Discharge status: Home / Self Care | Payer: 59 | Source: Ambulatory Visit | Attending: Surgery | Admitting: Surgery

## 2018-11-28 DIAGNOSIS — T8189XA Other complications of procedures, not elsewhere classified, initial encounter: Secondary | ICD-10-CM | POA: Diagnosis not present

## 2018-11-28 DIAGNOSIS — S301XXA Contusion of abdominal wall, initial encounter: Secondary | ICD-10-CM

## 2018-11-28 DIAGNOSIS — Z4689 Encounter for fitting and adjustment of other specified devices: Secondary | ICD-10-CM | POA: Diagnosis not present

## 2018-11-28 HISTORY — PX: IR RADIOLOGIST EVAL & MGMT: IMG5224

## 2018-11-28 NOTE — Progress Notes (Signed)
Patient ID: Amy Adams, female   DOB: January 16, 1966, 53 y.o.   MRN: [id number]       Chief Complaint: Patient was seen in consultation today for  Chief Complaint  Patient presents with  . Follow-up    abscess drain   at the request of [place]  Referring Physician(s): Gross,Steven  History of Present Illness: Amy Adams is a 53 y.o. female  Who underwent panniculectomy 10/26/2018 after significant weight loss.  She has some spontaneous drainage from the incision.  Some suspicion of infection.  CT demonstrated a large amount of postoperative fluid in the anterior abdominal wall with a small amount of gas.  Percutaneous drain was placed under ultrasound 11/13/2018.  She presents for follow-up.  She has had continuing significant output from the suction bulb.  She had to bulb folds of fluid yesterday.  Today the bulb is about two thirds flow already, with serosanguineous thin fluid.  No further draining from the incision.  No new symptoms.  Past Medical History:  Diagnosis Date  . Anxiety   . Arthritis   . Chronic foot pain, left   . GERD (gastroesophageal reflux disease)   . History of anal fissures   . Hyperlipidemia   . Hypertension   . Hypothyroidism   . Insulin dependent type 2 diabetes mellitus [facility])    endrocrinologist-- dr Chalmers Cater  . Migraines   . Peripheral neuropathy   . RA (rheumatoid arthritis) [facility])    rheumatologist-  dr Amil Amen  . SUI (stress urinary incontinence, female)   . Wears glasses     Past Surgical History:  Procedure Laterality Date  . ABDOMINAL HYSTERECTOMY  1990   with Right Salpingo--ophorectomy  . ANAL SPHINCTEROTOMY  01-05-2006    dr weatherly @[facility]   . BUNIONECTOMY  09-25-2009   dr Milinda Pointer @[facility]    right great toe  . CARPAL TUNNEL RELEASE Bilateral 1996;  1997  . CESAREAN SECTION  1988  . FOOT SURGERY  12/ 2014   dr Milinda Pointer   left 2nd hammertoe repair,  left 2nd metatarsal matrixectomy, left heel endoscopic plantar fasiotomy  . IR  RADIOLOGIST EVAL & MGMT  11/28/2018  . PANNICULECTOMY N/A 10/26/2018   Procedure: PANNICULECTOMY;  Surgeon: Johnathan Hausen, MD;  Location: [facility];  Service: General;  Laterality: N/A;  . TENDON REPAIR  09/2015   left posterior tibial and peroneal tendon repairs  . TOE AMPUTATION  01/2015   right 2nd toe  . TONSILLECTOMY  age 78  . TRANSOBTURATOR SLING  01-08-2004   dr Jeffie Pollock @[facility]     Allergies: Dilaudid [hydromorphone hcl]; Doxycycline; Topiramate; and Trazodone and nefazodone  Medications: Prior to Admission medications   Medication Sig Start Date End Date Taking? Authorizing Provider  acetaminophen (TYLENOL) 500 MG tablet Take 500 mg by mouth every 6 (six) hours as needed for fever.   Yes [provider]  albuterol (PROVENTIL HFA;VENTOLIN HFA) 108 (90 BASE) MCG/ACT inhaler Inhale 1-2 puffs into the lungs every 6 (six) hours as needed for wheezing or shortness of breath.   Yes [provider]  ALPRAZolam Duanne Moron) 1 MG tablet Take 1 mg by mouth 3 (three) times daily as needed for anxiety.    Yes [provider]  amoxicillin-clavulanate (AUGMENTIN) 500-125 MG tablet Take 500 mg by mouth 2 (two) times daily.   Yes [provider]  Continuous Blood Gluc Sensor (FREESTYLE LIBRE 14 DAY SENSOR) MISC USE AS DIRECTED EVERY 2 WEEKS SUBCUTANEOUSLY 05/24/18  Yes [provider]  cyclobenzaprine (FLEXERIL) 10  MG tablet Take 1 tablet (10 mg total) by mouth 3 (three) times daily. Patient taking differently: Take 10 mg by mouth 3 (three) times daily as needed for muscle spasms.  07/10/18  Yes Hyatt, Max T, DPM  DULoxetine (CYMBALTA) 60 MG capsule Take 120 mg by mouth at bedtime.   Yes [provider]  estradiol (ESTRACE) 0.5 MG tablet Take 0.5 mg by mouth every evening.    Yes [provider]  fenofibrate micronized (LOFIBRA) 134 MG capsule Take 134 mg by mouth every evening.    Yes [provider]  folic acid (FOLVITE) 1 MG tablet Take  2 mg by mouth daily.    Yes [provider]  gabapentin (NEURONTIN) 400 MG capsule TAKE 1 CAPSULE BY MOUTH 3 TIMES DAILY. Patient taking differently: Take 400 mg by mouth 3 (three) times daily.  01/31/18  Yes Hyatt, Max T, DPM  hydrochlorothiazide (HYDRODIURIL) 25 MG tablet Take 25 mg by mouth daily.   Yes [provider]  hydroxychloroquine (PLAQUENIL) 200 MG tablet Take 400 mg by mouth at bedtime.    Yes [provider]  insulin glargine (LANTUS SOLOSTAR) 100 UNIT/ML injection Inject 25 Units into the skin 2 (two) times daily. Per [place]   Yes [provider]  insulin lispro (HUMALOG KWIKPEN) 100 UNIT/ML injection Inject 13 Units into the skin 3 (three) times daily with meals. 12-15 units sliding scale   Yes [provider]  levocetirizine (XYZAL) 5 MG tablet Take 5 mg by mouth every evening.   Yes [provider]  levothyroxine (SYNTHROID, LEVOTHROID) 50 MCG tablet Take 50 mcg by mouth daily before breakfast.    Yes [provider]  losartan (COZAAR) 100 MG tablet Take 100 mg by mouth daily.   Yes [provider]  metFORMIN (GLUCOPHAGE-XR) 500 MG 24 hr tablet Take 1,000 mg by mouth at bedtime.    Yes [provider]  montelukast (SINGULAIR) 10 MG tablet Take 10 mg by mouth at bedtime.   Yes [provider]  mupirocin ointment (BACTROBAN) 2 % Apply 1 application topically 3 (three) times daily.  05/24/18  Yes [provider]  nystatin-triamcinolone ointment (MYCOLOG) Apply 1 application topically 2 (two) times daily.  01/31/18  Yes [provider]  omeprazole (PRILOSEC) 40 MG capsule Take 40 mg by mouth every evening.    Yes [provider]  traMADol (ULTRAM) 50 MG tablet Take 1 tablet (50 mg total) by mouth every 8 (eight) hours as needed. Patient taking differently: Take 50 mg by mouth at bedtime.  12/13/16  Yes Hyatt, Max T, DPM  UNIFINE PENTIPS 31G X 5 MM MISC USE AS DIRECTED 5 TIMES  PER DAY 06/19/18  Yes [provider]  valACYclovir (VALTREX) 1000 MG tablet Take 1,000 mg by mouth 2 (two) times daily as needed (fever blisters).  05/01/18  Yes [provider]  zolpidem (AMBIEN CR) 12.5 MG CR tablet Take 12.5 mg by mouth at bedtime.    Yes [provider]  methotrexate 25 MG/ML SOLN Inject 20 mg into the muscle every Thursday. Inject 0.8 mL once weekly at bedtime. Every Thursday    [provider]  Methotrexate, Anti-Rheumatic, (METHOTREXATE, PF, [place]) Inject 0.8 mLs into the skin once a week. thursday's    [provider]  oxyCODONE (OXY IR/ROXICODONE) 5 MG immediate release tablet Take 1 tablet (5 mg total) by mouth every 6 (six) hours as needed for moderate pain, severe pain or breakthrough pain. Patient not taking:  Reported on 11/28/2018 10/27/18   Excell Seltzer, MD  oxyCODONE (OXY IR/ROXICODONE) 5 MG immediate release tablet Take 1 tablet (5 mg total) by mouth every 6 (six) hours as needed for moderate pain, severe pain or breakthrough pain. Patient not taking: Reported on 11/28/2018 10/27/18   Excell Seltzer, MD     Family History  Problem Relation Age of Onset  . Diabetes Mother   . Migraines Mother   . Diabetes Sister     Social History   Socioeconomic History  . Marital status: Divorced    Spouse name: Not on file  . Number of children: Not on file  . Years of education: Not on file  . Highest education level: Not on file  Occupational History  . Not on file  Social Needs  . Financial resource strain: Not on file  . Food insecurity:    Worry: Not on file    Inability: Not on file  . Transportation needs:    Medical: Not on file    Non-medical: Not on file  Tobacco Use  . Smoking status: Never Smoker  . Smokeless tobacco: Never Used  Substance and Sexual Activity  . Alcohol use: No  . Drug use: Never  . Sexual activity: Not on file  Lifestyle  . Physical activity:    Days per week: Not on file     Minutes per session: Not on file  . Stress: Not on file  Relationships  . Social connections:    Talks on phone: Not on file    Gets together: Not on file    Attends religious service: Not on file    Active member of club or organization: Not on file    Attends meetings of clubs or organizations: Not on file    Relationship status: Not on file  Other Topics Concern  . Not on file  Social History Narrative  . Not on file    ECOG Status: 1 - Symptomatic but completely ambulatory Physical Exam Review of Systems: A 12 point ROS discussed and pertinent positives are indicated in the HPI above.  All other systems are negative.  Review of Systems  Vital Signs: BP 122/65   Pulse 91   Temp 98.2 F (36.8 C) (Oral)   Resp 15   Ht 5\' 9"  (1.753 m)   Wt 89.8 kg   SpO2 100%   BMI 29.24 kg/m  Constitutional: Oriented to person, place, and time. Well-developed and well-nourished. No distress.  Last Weight  Most recent update: 11/28/2018  1:06 PM   Weight  89.8 kg (198 lb)           HENT:  Head: Normocephalic and atraumatic.  Eyes: Conjunctivae and EOM are normal. Right eye exhibits no discharge. Left eye exhibits no discharge. No scleral icterus.  Neck: No JVD present.  Pulmonary/Chest: Effort normal. No stridor. No respiratory distress.  Abdomen: obese, soft, non distended.  Right lower quadrant drain site clean dry intact. Neurological:  alert and oriented to person, place, and time.  Skin: Skin is warm and dry.  not diaphoretic.  Psychiatric:   normal mood and affect.   behavior is normal. Judgment and thought content normal.     Mallampati Score:     Imaging: US Guided Needle Placement  Result Date: 11/13/2018 INDICATION: 53 year old with postoperative seroma following a panniculectomy. Patient is complaining of drainage at the incision site. EXAM: ULTRASOUND-GUIDED PLACEMENT OF A DRAIN WITHIN THE LOWER ABDOMINAL SUBCUTANEOUS SEROMA MEDICATIONS: None  ANESTHESIA/SEDATION: Fentanyl  100 mcg IV; Versed 2.0 mg IV Moderate Sedation Time:  15 minutes The patient was continuously monitored during the procedure by the interventional radiology nurse under my direct supervision. COMPLICATIONS: None immediate. PROCEDURE: Informed written consent was obtained from the patient after a thorough discussion of the procedural risks, benefits and alternatives. All questions were addressed. A timeout was performed prior to the initiation of the procedure. The lower abdominal subcutaneous tissues was evaluated with ultrasound. Small amount of fluid was identified in the right lower abdominal subcutaneous tissues. Skin was prepped with chlorhexidine and sterile field was created. Yueh catheter was directed into the fluid collection with ultrasound guidance. Mildly cloudy serosanguineous fluid was aspirated. A stiff Amplatz wire was advanced into the collection and the tract was dilated to accommodate a 10 [place] drain. Approximately 100 mL of the serosanguineous fluid was removed. Catheter was sutured to the skin and attached to a suction bulb. FINDINGS: Small amount of septated fluid in the right lower quadrant subcutaneous tissues. No other significant fluid collections were identified in the subcutaneous tissues. Drain was successfully placed within the right lateral subcutaneous collection and approximately 100 mL of serosanguineous fluid was removed. IMPRESSION: Ultrasound-guided placement of a drainage catheter within the postoperative seroma. Electronically Signed   By: Markus Daft M.D.   On: 11/13/2018 17:29   Ct Abdomen Pelvis W Contrast  Result Date: 11/07/2018 CLINICAL DATA:  Acute onset of hematuria. EXAM: CT ABDOMEN AND PELVIS WITH CONTRAST TECHNIQUE: Multidetector CT imaging of the abdomen and pelvis was performed using the standard protocol following bolus administration of intravenous contrast. CONTRAST:  172mL ISOVUE-300 IOPAMIDOL (ISOVUE-300) INJECTION 61%  COMPARISON:  CT of the abdomen and pelvis from 01/11/2010, and MRI of the lumbar spine performed 11/24/2006 FINDINGS: Lower chest: Mild scarring is noted at the lung bases. The visualized portions of the mediastinum are unremarkable. Hepatobiliary: The liver is unremarkable in appearance. The gallbladder is unremarkable in appearance. The common bile duct remains normal in caliber. Pancreas: The pancreas is within normal limits. Spleen: The spleen is enlarged, measuring 15.5 cm in length. Adrenals/Urinary Tract: The adrenal glands are unremarkable in appearance. Nonspecific perinephric stranding is noted bilaterally. A left renal cyst is noted. There is no evidence of hydronephrosis. No renal or ureteral stones are identified. Stomach/Bowel: The stomach is unremarkable in appearance. The small bowel is within normal limits. The appendix is normal in caliber, without evidence of appendicitis. The colon is unremarkable in appearance. Vascular/Lymphatic: The abdominal aorta is unremarkable in appearance. A circumaortic left renal vein is noted. The inferior vena cava is grossly unremarkable. No retroperitoneal lymphadenopathy is seen. No pelvic sidewall lymphadenopathy is identified. Reproductive: There is question of mild wall thickening along the base of the bladder on sagittal images. The bladder is otherwise unremarkable. The patient is status post hysterectomy. No suspicious adnexal masses are seen. Other: A large amount of postoperative fluid is noted along the anterior abdominal wall, with trace air, reflecting recent panniculectomy. Musculoskeletal: No acute osseous abnormalities are identified. The visualized musculature is unremarkable in appearance. IMPRESSION: 1. Question of mild wall thickening along the base of the bladder on sagittal images. Given the patient's hematuria, cystoscopy would be helpful for further evaluation, as deemed clinically appropriate. 2. Large amount of postoperative fluid along the  anterior abdominal wall, with trace air, reflecting recent panniculectomy. 3. Left renal cyst noted. 4. Splenomegaly. 5. Circumaortic left renal vein incidentally noted. Electronically Signed   By: Garald Balding M.D.   On: 11/07/2018 21:11  Ir Radiologist Eval & Mgmt  Result Date: 11/28/2018 Please refer to notes tab for details about interventional procedure. (Op Note)   Labs:  CBC: Recent Labs    10/26/18 1550 10/27/18 0315 11/07/18 1948 11/13/18 1208  WBC 10.5 10.4 14.6* 6.4  HGB 13.1 12.2 11.6* 12.3  HCT 39.4 37.1 35.4* 38.7  PLT 212 228 219 385    COAGS: Recent Labs    11/13/18 1208  INR 0.97    BMP: Recent Labs    10/22/18 1620 10/26/18 1550 10/27/18 0315 11/07/18 1948 11/13/18 1208  NA 138  --  138 136 140  K 4.1  --  4.0 3.4* 3.9  CL 99  --  99 98 102  CO2 28  --  26 25 28   GLUCOSE 122*  --  191* 137* 137*  BUN 16  --  19 27* 10  CALCIUM 9.3  --  9.0 9.0 9.3  CREATININE 0.86 1.26* 1.05* 1.23* 0.95  GFRNONAA >60 48* >60 50* >60  GFRAA >60 56* >60 58* >60    LIVER FUNCTION TESTS: No results for input(s): BILITOT, AST, ALT, ALKPHOS, PROT, ALBUMIN in the last 8760 hours.  TUMOR MARKERS: No results for input(s): AFPTM, CEA, CA199, CHROMGRNA in the last 8760 hours.  Assessment and Plan:  My impression is that this patient is having good output from her percutaneous drain extending into the panniculectomy bed.  There is continued significant output, so the drain needs to remain in place.  No complicating features so no imaging is currently indicated. She is scheduled to see Dr. Stryder Poitra Done this Friday.  We will follow-up with him, and can see this patient once output tapers off, or on an as-needed basis for catheter management.  Should output taper off, I would recommend initiation of low volume 3-5 mm sterile saline flush at least twice a day.  Thank you for this interesting consult.  I greatly enjoyed meeting CHAITRA MAST and look forward to  participating in their care.  A copy of this report was sent to the requesting provider on this date.  Electronically Signed: Rickard Rhymes 11/28/2018, 2:18 PM   I spent a total of    15 Minutes in face to face in clinical consultation, greater than 50% of which was counseling/coordinating care for percutaneous drain catheter.

## 2018-11-29 ENCOUNTER — Encounter: Payer: Self-pay | Admitting: *Deleted

## 2018-11-29 ENCOUNTER — Other Ambulatory Visit: Payer: Self-pay | Admitting: Surgery

## 2018-11-29 ENCOUNTER — Ambulatory Visit
Admission: RE | Admit: 2018-11-29 | Discharge: 2018-11-29 | Disposition: A | Discharge status: Home / Self Care | Payer: 59 | Source: Ambulatory Visit | Attending: Surgery | Admitting: Surgery

## 2018-11-29 DIAGNOSIS — Z4659 Encounter for fitting and adjustment of other gastrointestinal appliance and device: Secondary | ICD-10-CM | POA: Diagnosis not present

## 2018-11-29 DIAGNOSIS — L7634 Postprocedural seroma of skin and subcutaneous tissue following other procedure: Secondary | ICD-10-CM | POA: Diagnosis not present

## 2018-11-29 DIAGNOSIS — K651 Peritoneal abscess: Secondary | ICD-10-CM

## 2018-11-29 HISTORY — PX: IR RADIOLOGIST EVAL & MGMT: IMG5224

## 2018-11-29 NOTE — Progress Notes (Signed)
Referring Physician(s): Gross,Steven Dr Kaylyn Lim  Chief Complaint: The patient is seen in follow up today s/p 12/31: Ultrasound-guided placement of a drainage catheter within the postoperative seroma.  History of present illness:  Postoperative seroma following a panniculectomy 10/26/18. Was seen in IR OP Clinic just yesterday by Dr Vernard Gambles My impression is that this patient is having good output from her percutaneous drain extending into the panniculectomy bed.  There is continued significant output, so the drain needs to remain in place.  No complicating features so no imaging is currently indicated. She is scheduled to see Dr. Hassell Done this Friday.  We will follow-up with him, and can see this patient once output tapers off, or on an as-needed basis for catheter management.  Should output taper off, I would recommend initiation of low volume 3-5 mm sterile saline flush at least twice a day.  After visit yesterday she noted acute decrease in OP from drain. Drastic change-- was used to emptying JP 3-5 x/daily-- now no OP for almost 24 hrs. Denies abd pain Denies N/V or chills Here today for evaluation   Past Medical History:  Diagnosis Date  . Anxiety   . Arthritis   . Chronic foot pain, left   . GERD (gastroesophageal reflux disease)   . History of anal fissures   . Hyperlipidemia   . Hypertension   . Hypothyroidism   . Insulin dependent type 2 diabetes mellitus [facility])    endrocrinologist-- dr Chalmers Cater  . Migraines   . Peripheral neuropathy   . RA (rheumatoid arthritis) [facility])    rheumatologist-  dr Amil Amen  . SUI (stress urinary incontinence, female)   . Wears glasses     Past Surgical History:  Procedure Laterality Date  . ABDOMINAL HYSTERECTOMY  1990   with Right Salpingo--ophorectomy  . ANAL SPHINCTEROTOMY  01-05-2006    dr Rise Patience @[facility]   . BUNIONECTOMY  09-25-2009   dr Milinda Pointer @[facility]    right great toe  . CARPAL TUNNEL RELEASE Bilateral 1996;  1997  . CESAREAN  SECTION  1988  . FOOT SURGERY  12/ 2014   dr Milinda Pointer   left 2nd hammertoe repair,  left 2nd metatarsal matrixectomy, left heel endoscopic plantar fasiotomy  . IR RADIOLOGIST EVAL & MGMT  11/28/2018  . PANNICULECTOMY N/A 10/26/2018   Procedure: PANNICULECTOMY;  Surgeon: Johnathan Hausen, MD;  Location: [facility];  Service: General;  Laterality: N/A;  . TENDON REPAIR  09/2015   left posterior tibial and peroneal tendon repairs  . TOE AMPUTATION  01/2015   right 2nd toe  . TONSILLECTOMY  age 53  . TRANSOBTURATOR SLING  01-08-2004   dr Jeffie Pollock @[facility]     Allergies: Dilaudid [hydromorphone hcl]; Doxycycline; Topiramate; and Trazodone and nefazodone  Medications: Prior to Admission medications   Medication Sig Start Date End Date Taking? Authorizing Provider  acetaminophen (TYLENOL) 500 MG tablet Take 500 mg by mouth every 6 (six) hours as needed for fever.    [provider]  albuterol (PROVENTIL HFA;VENTOLIN HFA) 108 (90 BASE) MCG/ACT inhaler Inhale 1-2 puffs into the lungs every 6 (six) hours as needed for wheezing or shortness of breath.    [provider]  ALPRAZolam Duanne Moron) 1 MG tablet Take 1 mg by mouth 3 (three) times daily as needed for anxiety.     [provider]  amoxicillin-clavulanate (AUGMENTIN) 500-125 MG tablet Take 500 mg by mouth 2 (two) times daily.    [provider]  Continuous Blood Gluc Sensor (FREESTYLE LIBRE 14 DAY  SENSOR) MISC USE AS DIRECTED EVERY 2 WEEKS SUBCUTANEOUSLY 05/24/18   [provider]  cyclobenzaprine (FLEXERIL) 10 MG tablet Take 1 tablet (10 mg total) by mouth 3 (three) times daily. Patient taking differently: Take 10 mg by mouth 3 (three) times daily as needed for muscle spasms.  07/10/18   Hyatt, Max T, DPM  DULoxetine (CYMBALTA) 60 MG capsule Take 120 mg by mouth at bedtime.    [provider]  estradiol (ESTRACE) 0.5 MG tablet Take 0.5 mg by mouth every evening.     [provider]  fenofibrate  micronized (LOFIBRA) 134 MG capsule Take 134 mg by mouth every evening.     [provider]  folic acid (FOLVITE) 1 MG tablet Take 2 mg by mouth daily.     [provider]  gabapentin (NEURONTIN) 400 MG capsule TAKE 1 CAPSULE BY MOUTH 3 TIMES DAILY. Patient taking differently: Take 400 mg by mouth 3 (three) times daily.  01/31/18   Hyatt, Max T, DPM  hydrochlorothiazide (HYDRODIURIL) 25 MG tablet Take 25 mg by mouth daily.    [provider]  hydroxychloroquine (PLAQUENIL) 200 MG tablet Take 400 mg by mouth at bedtime.     [provider]  insulin glargine (LANTUS SOLOSTAR) 100 UNIT/ML injection Inject 25 Units into the skin 2 (two) times daily. Per [place]    [provider]  insulin lispro (HUMALOG KWIKPEN) 100 UNIT/ML injection Inject 13 Units into the skin 3 (three) times daily with meals. 12-15 units sliding scale    [provider]  levocetirizine (XYZAL) 5 MG tablet Take 5 mg by mouth every evening.    [provider]  levothyroxine (SYNTHROID, LEVOTHROID) 50 MCG tablet Take 50 mcg by mouth daily before breakfast.     [provider]  losartan (COZAAR) 100 MG tablet Take 100 mg by mouth daily.    [provider]  metFORMIN (GLUCOPHAGE-XR) 500 MG 24 hr tablet Take 1,000 mg by mouth at bedtime.     [provider]  methotrexate 25 MG/ML SOLN Inject 20 mg into the muscle every Thursday. Inject 0.8 mL once weekly at bedtime. Every Thursday    [provider]  Methotrexate, Anti-Rheumatic, (METHOTREXATE, PF, [place]) Inject 0.8 mLs into the skin once a week. thursday's    [provider]  montelukast (SINGULAIR) 10 MG tablet Take 10 mg by mouth at bedtime.    [provider]  mupirocin ointment (BACTROBAN) 2 % Apply 1 application topically 3 (three) times daily.  05/24/18   [provider]  nystatin-triamcinolone ointment (MYCOLOG) Apply 1 application topically 2 (two) times daily.   01/31/18   [provider]  omeprazole (PRILOSEC) 40 MG capsule Take 40 mg by mouth every evening.     [provider]  oxyCODONE (OXY IR/ROXICODONE) 5 MG immediate release tablet Take 1 tablet (5 mg total) by mouth every 6 (six) hours as needed for moderate pain, severe pain or breakthrough pain. Patient not taking: Reported on 11/28/2018 10/27/18   Excell Seltzer, MD  oxyCODONE (OXY IR/ROXICODONE) 5 MG immediate release tablet Take 1 tablet (5 mg total) by mouth every 6 (six) hours as needed for moderate pain, severe pain or breakthrough pain. Patient not taking: Reported on 11/28/2018 10/27/18   Excell Seltzer, MD  traMADol (ULTRAM) 50 MG tablet Take 1 tablet (50 mg total) by mouth every 8 (eight) hours as needed. Patient taking differently: Take 50 mg by mouth at bedtime.  12/13/16   Hyatt, Max  T, DPM  UNIFINE PENTIPS 31G X 5 MM MISC USE AS DIRECTED 5 TIMES PER DAY 06/19/18   [provider]  valACYclovir (VALTREX) 1000 MG tablet Take 1,000 mg by mouth 2 (two) times daily as needed (fever blisters).  05/01/18   [provider]  zolpidem (AMBIEN CR) 12.5 MG CR tablet Take 12.5 mg by mouth at bedtime.     [provider]     Family History  Problem Relation Age of Onset  . Diabetes Mother   . Migraines Mother   . Diabetes Sister     Social History   Socioeconomic History  . Marital status: Divorced    Spouse name: Not on file  . Number of children: Not on file  . Years of education: Not on file  . Highest education level: Not on file  Occupational History  . Not on file  Social Needs  . Financial resource strain: Not on file  . Food insecurity:    Worry: Not on file    Inability: Not on file  . Transportation needs:    Medical: Not on file    Non-medical: Not on file  Tobacco Use  . Smoking status: Never Smoker  . Smokeless tobacco: Never Used  Substance and Sexual Activity  . Alcohol use: No  . Drug use: Never  . Sexual  activity: Not on file  Lifestyle  . Physical activity:    Days per week: Not on file    Minutes per session: Not on file  . Stress: Not on file  Relationships  . Social connections:    Talks on phone: Not on file    Gets together: Not on file    Attends religious service: Not on file    Active member of club or organization: Not on file    Attends meetings of clubs or organizations: Not on file    Relationship status: Not on file  Other Topics Concern  . Not on file  Social History Narrative  . Not on file     Vital Signs: There were no vitals taken for this visit.  Physical Exam Skin:    General: Skin is warm and dry.     Comments: Site is clean and dry NT no bleeding OP minimal in JP Flushed drain 3x with 10 cc sterile saline x 3 Flushes easily- no pain Aspiration is difficult  Neurological:     Mental Status: She is oriented to person, place, and time.  Psychiatric:        Behavior: Behavior normal.        Thought Content: Thought content normal.     Imaging: Ir Radiologist Eval & Mgmt  Result Date: 11/28/2018 Please refer to notes tab for details about interventional procedure. (Op Note)   Labs:  CBC: Recent Labs    10/26/18 1550 10/27/18 0315 11/07/18 1948 11/13/18 1208  WBC 10.5 10.4 14.6* 6.4  HGB 13.1 12.2 11.6* 12.3  HCT 39.4 37.1 35.4* 38.7  PLT 212 228 219 385    COAGS: Recent Labs    11/13/18 1208  INR 0.97    BMP: Recent Labs    10/22/18 1620 10/26/18 1550 10/27/18 0315 11/07/18 1948 11/13/18 1208  NA 138  --  138 136 140  K 4.1  --  4.0 3.4* 3.9  CL 99  --  99 98 102  CO2 28  --  26 25 28   GLUCOSE 122*  --  191* 137* 137*  BUN 16  --  19 27* 10  CALCIUM 9.3  --  9.0 9.0 9.3  CREATININE 0.86 1.26* 1.05* 1.23* 0.95  GFRNONAA >60 48* >60 50* >60  GFRAA >60 56* >60 58* >60    LIVER FUNCTION TESTS: No results for input(s): BILITOT, AST, ALT, ALKPHOS, PROT, ALBUMIN in the last 8760 hours.  Assessment:  Seroma drain  intact OP is minimal Increased OP significantly after flushes Discussed with Dr Barbie Banner No imaging needed today Pt to keep appt to see Dr Rockne Coons tomorrow Will re CT 1 week at clinic; possible pull drain Pt is agreeable   Signed: Lavonia Drafts, PA-C 11/29/2018, 1:38 PM   Please refer to Dr. Barbie Banner attestation of this note for management and plan.

## 2018-12-06 ENCOUNTER — Other Ambulatory Visit: Payer: 59

## 2018-12-06 ENCOUNTER — Ambulatory Visit
Admission: RE | Admit: 2018-12-06 | Discharge: 2018-12-06 | Disposition: A | Discharge status: Home / Self Care | Payer: 59 | Source: Ambulatory Visit | Attending: Surgery | Admitting: Surgery

## 2018-12-06 ENCOUNTER — Encounter: Payer: Self-pay | Admitting: Radiology

## 2018-12-06 DIAGNOSIS — K651 Peritoneal abscess: Secondary | ICD-10-CM

## 2018-12-06 DIAGNOSIS — Z4659 Encounter for fitting and adjustment of other gastrointestinal appliance and device: Secondary | ICD-10-CM | POA: Diagnosis not present

## 2018-12-06 DIAGNOSIS — E7800 Pure hypercholesterolemia, unspecified: Secondary | ICD-10-CM | POA: Diagnosis not present

## 2018-12-06 DIAGNOSIS — E039 Hypothyroidism, unspecified: Secondary | ICD-10-CM | POA: Diagnosis not present

## 2018-12-06 DIAGNOSIS — L7634 Postprocedural seroma of skin and subcutaneous tissue following other procedure: Secondary | ICD-10-CM | POA: Diagnosis not present

## 2018-12-06 DIAGNOSIS — K91872 Postprocedural seroma of a digestive system organ or structure following a digestive system procedure: Secondary | ICD-10-CM | POA: Diagnosis not present

## 2018-12-06 DIAGNOSIS — E1165 Type 2 diabetes mellitus with hyperglycemia: Secondary | ICD-10-CM | POA: Diagnosis not present

## 2018-12-06 HISTORY — PX: IR RADIOLOGIST EVAL & MGMT: IMG5224

## 2018-12-06 MED ORDER — IOPAMIDOL (ISOVUE-300) INJECTION 61%
100.0000 mL | Freq: Once | INTRAVENOUS | Status: AC | PRN
  Administered 2018-12-06: 100 mL via INTRAVENOUS

## 2018-12-06 NOTE — Progress Notes (Signed)
Chief Complaint: Status post percutaneous catheter drainage of abdominal wall seroma.  Referring Physician(s): Gross,Steven  History of Present Illness: Amy Adams is a 53 y.o. female status post panniculectomy on 10/26/2018.  A postoperative symptomatic seroma was drained by percutaneous catheter placement under ultrasound guidance on 11/13/2018.  This initially resulted in significant fluid output but over the last several days the patient has had no discernible output from the catheter.  She remains on antibiotics and has almost completed her course of Augmentin.  She states that she does have a small midline lower abdominal wall wound that periodically drains fluid.  Past Medical History:  Diagnosis Date  . Anxiety   . Arthritis   . Chronic foot pain, left   . GERD (gastroesophageal reflux disease)   . History of anal fissures   . Hyperlipidemia   . Hypertension   . Hypothyroidism   . Insulin dependent type 2 diabetes mellitus [facility])    endrocrinologist-- dr Chalmers Cater  . Migraines   . Peripheral neuropathy   . RA (rheumatoid arthritis) [facility])    rheumatologist-  dr Amil Amen  . SUI (stress urinary incontinence, female)   . Wears glasses     Past Surgical History:  Procedure Laterality Date  . ABDOMINAL HYSTERECTOMY  1990   with Right Salpingo--ophorectomy  . ANAL SPHINCTEROTOMY  01-05-2006    dr Rise Patience @[facility]   . BUNIONECTOMY  09-25-2009   dr Milinda Pointer @[facility]    right great toe  . CARPAL TUNNEL RELEASE Bilateral 1996;  1997  . CESAREAN SECTION  1988  . FOOT SURGERY  12/ 2014   dr Milinda Pointer   left 2nd hammertoe repair,  left 2nd metatarsal matrixectomy, left heel endoscopic plantar fasiotomy  . IR RADIOLOGIST EVAL & MGMT  11/28/2018  . IR RADIOLOGIST EVAL & MGMT  11/29/2018  . IR RADIOLOGIST EVAL & MGMT  12/06/2018  . PANNICULECTOMY N/A 10/26/2018   Procedure: PANNICULECTOMY;  Surgeon: Johnathan Hausen, MD;  Location: [facility];  Service: General;  Laterality: N/A;  . TENDON  REPAIR  09/2015   left posterior tibial and peroneal tendon repairs  . TOE AMPUTATION  01/2015   right 2nd toe  . TONSILLECTOMY  age 51  . TRANSOBTURATOR SLING  01-08-2004   dr Jeffie Pollock @[facility]     Allergies: Dilaudid [hydromorphone hcl]; Doxycycline; Topiramate; and Trazodone and nefazodone  Medications: Prior to Admission medications   Medication Sig Start Date End Date Taking? Authorizing Provider  acetaminophen (TYLENOL) 500 MG tablet Take 500 mg by mouth every 6 (six) hours as needed for fever.    [provider]  albuterol (PROVENTIL HFA;VENTOLIN HFA) 108 (90 BASE) MCG/ACT inhaler Inhale 1-2 puffs into the lungs every 6 (six) hours as needed for wheezing or shortness of breath.    [provider]  ALPRAZolam Duanne Moron) 1 MG tablet Take 1 mg by mouth 3 (three) times daily as needed for anxiety.     [provider]  amoxicillin-clavulanate (AUGMENTIN) 500-125 MG tablet Take 500 mg by mouth 2 (two) times daily.    [provider]  Continuous Blood Gluc Sensor (FREESTYLE LIBRE 14 DAY SENSOR) MISC USE AS DIRECTED EVERY 2 WEEKS SUBCUTANEOUSLY 05/24/18   [provider]  cyclobenzaprine (FLEXERIL) 10 MG tablet Take 1 tablet (10 mg total) by mouth 3 (three) times daily. Patient taking differently: Take 10 mg by mouth 3 (three) times daily as needed for muscle spasms.  07/10/18   Hyatt, Max T, DPM  DULoxetine (CYMBALTA) 60 MG capsule Take 120  mg by mouth at bedtime.    [provider]  estradiol (ESTRACE) 0.5 MG tablet Take 0.5 mg by mouth every evening.     [provider]  fenofibrate micronized (LOFIBRA) 134 MG capsule Take 134 mg by mouth every evening.     [provider]  folic acid (FOLVITE) 1 MG tablet Take 2 mg by mouth daily.     [provider]  gabapentin (NEURONTIN) 400 MG capsule TAKE 1 CAPSULE BY MOUTH 3 TIMES DAILY. Patient taking differently: Take 400 mg by mouth 3 (three) times daily.  01/31/18   Hyatt, Max  T, DPM  hydrochlorothiazide (HYDRODIURIL) 25 MG tablet Take 25 mg by mouth daily.    [provider]  hydroxychloroquine (PLAQUENIL) 200 MG tablet Take 400 mg by mouth at bedtime.     [provider]  insulin glargine (LANTUS SOLOSTAR) 100 UNIT/ML injection Inject 25 Units into the skin 2 (two) times daily. Per [place]    [provider]  insulin lispro (HUMALOG KWIKPEN) 100 UNIT/ML injection Inject 13 Units into the skin 3 (three) times daily with meals. 12-15 units sliding scale    [provider]  levocetirizine (XYZAL) 5 MG tablet Take 5 mg by mouth every evening.    [provider]  levothyroxine (SYNTHROID, LEVOTHROID) 50 MCG tablet Take 50 mcg by mouth daily before breakfast.     [provider]  losartan (COZAAR) 100 MG tablet Take 100 mg by mouth daily.    [provider]  metFORMIN (GLUCOPHAGE-XR) 500 MG 24 hr tablet Take 1,000 mg by mouth at bedtime.     [provider]  methotrexate 25 MG/ML SOLN Inject 20 mg into the muscle every Thursday. Inject 0.8 mL once weekly at bedtime. Every Thursday    [provider]  Methotrexate, Anti-Rheumatic, (METHOTREXATE, PF, [place]) Inject 0.8 mLs into the skin once a week. thursday's    [provider]  montelukast (SINGULAIR) 10 MG tablet Take 10 mg by mouth at bedtime.    [provider]  mupirocin ointment (BACTROBAN) 2 % Apply 1 application topically 3 (three) times daily.  05/24/18   [provider]  nystatin-triamcinolone ointment (MYCOLOG) Apply 1 application topically 2 (two) times daily.  01/31/18   [provider]  omeprazole (PRILOSEC) 40 MG capsule Take 40 mg by mouth every evening.     [provider]  oxyCODONE (OXY IR/ROXICODONE) 5 MG immediate release tablet Take 1 tablet (5 mg total) by mouth every 6 (six) hours as needed for moderate pain, severe pain or breakthrough pain. Patient not taking: Reported on 11/28/2018  10/27/18   Excell Seltzer, MD  oxyCODONE (OXY IR/ROXICODONE) 5 MG immediate release tablet Take 1 tablet (5 mg total) by mouth every 6 (six) hours as needed for moderate pain, severe pain or breakthrough pain. Patient not taking: Reported on 11/28/2018 10/27/18   Excell Seltzer, MD  traMADol (ULTRAM) 50 MG tablet Take 1 tablet (50 mg total) by mouth every 8 (eight) hours as needed. Patient taking differently: Take 50 mg by mouth at bedtime.  12/13/16   Hyatt, Max T, DPM  UNIFINE PENTIPS 31G X 5 MM MISC USE AS DIRECTED 5 TIMES PER DAY 06/19/18   [provider]  valACYclovir (VALTREX) 1000 MG tablet Take 1,000 mg by mouth 2 (two) times daily as needed (fever blisters).  05/01/18   [provider]  zolpidem (AMBIEN CR) 12.5 MG CR tablet Take 12.5 mg by mouth at bedtime.  [provider]     Family History  Problem Relation Age of Onset  . Diabetes Mother   . Migraines Mother   . Diabetes Sister     Social History   Socioeconomic History  . Marital status: Divorced    Spouse name: Not on file  . Number of children: Not on file  . Years of education: Not on file  . Highest education level: Not on file  Occupational History  . Not on file  Social Needs  . Financial resource strain: Not on file  . Food insecurity:    Worry: Not on file    Inability: Not on file  . Transportation needs:    Medical: Not on file    Non-medical: Not on file  Tobacco Use  . Smoking status: Never Smoker  . Smokeless tobacco: Never Used  Substance and Sexual Activity  . Alcohol use: No  . Drug use: Never  . Sexual activity: Not on file  Lifestyle  . Physical activity:    Days per week: Not on file    Minutes per session: Not on file  . Stress: Not on file  Relationships  . Social connections:    Talks on phone: Not on file    Gets together: Not on file    Attends religious service: Not on file    Active member of club or organization: Not on file    Attends  meetings of clubs or organizations: Not on file    Relationship status: Not on file  Other Topics Concern  . Not on file  Social History Narrative  . Not on file    Review of Systems: A 12 point ROS discussed and pertinent positives are indicated in the HPI above.  All other systems are negative.  Review of Systems  Constitutional: Negative.   Respiratory: Negative.   Cardiovascular: Negative.   Gastrointestinal: Negative.   Genitourinary: Negative.   Musculoskeletal: Negative.   Neurological: Negative.     Vital Signs: BP (!) 151/82   Pulse 82   Temp 98.1 F (36.7 C) (Oral)   Resp 14   Ht 5\' 9"  (1.753 m)   Wt 89.8 kg   SpO2 100%   BMI 29.24 kg/m   Physical Exam Constitutional:      General: She is not in acute distress.    Appearance: She is not ill-appearing, toxic-appearing or diaphoretic.  Abdominal:     General: There is no distension.     Palpations: Abdomen is soft.     Comments: Right lateral abdominal wall drain site clean and dry. Small amount of fluid in JP bulb tubing.  Midline lower abdominal wall scar and wound with no current visible fluid drainage.  Neurological:     Mental Status: She is alert.      Imaging: US Guided Needle Placement  Result Date: 11/13/2018 INDICATION: 53 year old with postoperative seroma following a panniculectomy. Patient is complaining of drainage at the incision site. EXAM: ULTRASOUND-GUIDED PLACEMENT OF A DRAIN WITHIN THE LOWER ABDOMINAL SUBCUTANEOUS SEROMA MEDICATIONS: None ANESTHESIA/SEDATION: Fentanyl 100 mcg IV; Versed 2.0 mg IV Moderate Sedation Time:  15 minutes The patient was continuously monitored during the procedure by the interventional radiology nurse under my direct supervision. COMPLICATIONS: None immediate. PROCEDURE: Informed written consent was obtained from the patient after a thorough discussion of the procedural risks, benefits and alternatives. All questions were addressed. A timeout was performed prior  to the initiation of the procedure. The lower abdominal subcutaneous tissues was  evaluated with ultrasound. Small amount of fluid was identified in the right lower abdominal subcutaneous tissues. Skin was prepped with chlorhexidine and sterile field was created. Yueh catheter was directed into the fluid collection with ultrasound guidance. Mildly cloudy serosanguineous fluid was aspirated. A stiff Amplatz wire was advanced into the collection and the tract was dilated to accommodate a 10 [place] drain. Approximately 100 mL of the serosanguineous fluid was removed. Catheter was sutured to the skin and attached to a suction bulb. FINDINGS: Small amount of septated fluid in the right lower quadrant subcutaneous tissues. No other significant fluid collections were identified in the subcutaneous tissues. Drain was successfully placed within the right lateral subcutaneous collection and approximately 100 mL of serosanguineous fluid was removed. IMPRESSION: Ultrasound-guided placement of a drainage catheter within the postoperative seroma. Electronically Signed   By: Markus Daft M.D.   On: 11/13/2018 17:29   Ct Abdomen Pelvis W Contrast  Result Date: 12/06/2018 CLINICAL DATA:  Status post percutaneous catheter drainage of postoperative seroma of the abdominal wall after panniculectomy on 11/13/2018. There has been no significant fluid return from the drainage catheter for several days. EXAM: CT ABDOMEN AND PELVIS WITH CONTRAST TECHNIQUE: Multidetector CT imaging of the abdomen and pelvis was performed using the standard protocol following bolus administration of intravenous contrast. CONTRAST:  136mL ISOVUE-300 IOPAMIDOL (ISOVUE-300) INJECTION 61% COMPARISON:  Prior CT on 11/07/2018 and imaging during ultrasound-guided drain placement on 11/13/2018. FINDINGS: Lower chest: No acute abnormality. Hepatobiliary: No focal liver abnormality is seen. No gallstones, gallbladder wall thickening, or biliary dilatation. Pancreas:  Unremarkable. No pancreatic ductal dilatation or surrounding inflammatory changes. Spleen: Normal in size without focal abnormality. Adrenals/Urinary Tract: Adrenal glands are unremarkable. Kidneys are normal, without renal calculi, focal lesion, or hydronephrosis. Bladder is unremarkable. Stomach/Bowel: No evidence of bowel obstruction, ileus or inflammation. No free air identified. Vascular/Lymphatic: No significant vascular findings are present. No enlarged abdominal or pelvic lymph nodes. Reproductive: Status post hysterectomy. No adnexal masses. Other: Percutaneous drain is along the far right lateral aspect of the abdominal wall within the subcutaneous fat. There is no fluid around the drain. Elongated seroma remains along the entire extent of the anterior abdominal wall inferior to the drain. In the midline, the fluid measures as much as 5.7 cm in thickness and contains gas bubbles. Lentiform shaped fluid collection extends along the entire width of the abdominal wall and is centered in the pelvis. Musculoskeletal: No acute or significant osseous findings. IMPRESSION: No further fluid collection is seen surrounding the percutaneous drainage catheter which is in the far right lateral abdominal wall within the subcutaneous fat. There remains a significant lentiform shaped abdominal wall seroma along the entire width of the lower abdominal wall of the pelvis and measuring up to 5.7 cm in thickness in the midline. The patient would likely benefit from a new drainage catheter placement which will be set up as an outpatient procedure under CT guidance. The previously placed right lateral abdominal wall drain was removed after the CT study. Please see separately dictated clinic note in Epic. Electronically Signed   By: Aletta Edouard M.D.   On: 12/06/2018 14:40   Ct Abdomen Pelvis W Contrast  Result Date: 11/07/2018 CLINICAL DATA:  Acute onset of hematuria. EXAM: CT ABDOMEN AND PELVIS WITH CONTRAST TECHNIQUE:  Multidetector CT imaging of the abdomen and pelvis was performed using the standard protocol following bolus administration of intravenous contrast. CONTRAST:  137mL ISOVUE-300 IOPAMIDOL (ISOVUE-300) INJECTION 61% COMPARISON:  CT of the abdomen and  pelvis from 01/11/2010, and MRI of the lumbar spine performed 11/24/2006 FINDINGS: Lower chest: Mild scarring is noted at the lung bases. The visualized portions of the mediastinum are unremarkable. Hepatobiliary: The liver is unremarkable in appearance. The gallbladder is unremarkable in appearance. The common bile duct remains normal in caliber. Pancreas: The pancreas is within normal limits. Spleen: The spleen is enlarged, measuring 15.5 cm in length. Adrenals/Urinary Tract: The adrenal glands are unremarkable in appearance. Nonspecific perinephric stranding is noted bilaterally. A left renal cyst is noted. There is no evidence of hydronephrosis. No renal or ureteral stones are identified. Stomach/Bowel: The stomach is unremarkable in appearance. The small bowel is within normal limits. The appendix is normal in caliber, without evidence of appendicitis. The colon is unremarkable in appearance. Vascular/Lymphatic: The abdominal aorta is unremarkable in appearance. A circumaortic left renal vein is noted. The inferior vena cava is grossly unremarkable. No retroperitoneal lymphadenopathy is seen. No pelvic sidewall lymphadenopathy is identified. Reproductive: There is question of mild wall thickening along the base of the bladder on sagittal images. The bladder is otherwise unremarkable. The patient is status post hysterectomy. No suspicious adnexal masses are seen. Other: A large amount of postoperative fluid is noted along the anterior abdominal wall, with trace air, reflecting recent panniculectomy. Musculoskeletal: No acute osseous abnormalities are identified. The visualized musculature is unremarkable in appearance. IMPRESSION: 1. Question of mild wall thickening  along the base of the bladder on sagittal images. Given the patient's hematuria, cystoscopy would be helpful for further evaluation, as deemed clinically appropriate. 2. Large amount of postoperative fluid along the anterior abdominal wall, with trace air, reflecting recent panniculectomy. 3. Left renal cyst noted. 4. Splenomegaly. 5. Circumaortic left renal vein incidentally noted. Electronically Signed   By: Garald Balding M.D.   On: 11/07/2018 21:11   Ir Radiologist Eval & Mgmt  Result Date: 12/06/2018 Please refer to notes tab for details about interventional procedure. (Op Note)  Ir Radiologist Eval & Mgmt  Result Date: 11/29/2018 Please refer to notes tab for details about interventional procedure. (Op Note)  Ir Radiologist Eval & Mgmt  Result Date: 11/28/2018 Please refer to notes tab for details about interventional procedure. (Op Note)   Labs:  CBC: Recent Labs    10/26/18 1550 10/27/18 0315 11/07/18 1948 11/13/18 1208  WBC 10.5 10.4 14.6* 6.4  HGB 13.1 12.2 11.6* 12.3  HCT 39.4 37.1 35.4* 38.7  PLT 212 228 219 385    COAGS: Recent Labs    11/13/18 1208  INR 0.97    BMP: Recent Labs    10/22/18 1620 10/26/18 1550 10/27/18 0315 11/07/18 1948 11/13/18 1208  NA 138  --  138 136 140  K 4.1  --  4.0 3.4* 3.9  CL 99  --  99 98 102  CO2 28  --  26 25 28   GLUCOSE 122*  --  191* 137* 137*  BUN 16  --  19 27* 10  CALCIUM 9.3  --  9.0 9.0 9.3  CREATININE 0.86 1.26* 1.05* 1.23* 0.95  GFRNONAA >60 48* >60 50* >60  GFRAA >60 56* >60 58* >60     Assessment and Plan:  CT today demonstrates no further fluid surrounding the far right lateral abdominal wall drainage catheter.  The catheter was removed today as it is no longer draining any fluid.  There remains a fairly substantial sized seroma containing a few air bubbles along the entire width of the lower abdominal wall.  Placement of a new  drain in the remaining fluid collection will help to resolve the remaining  seroma.  Outpatient CT-guided placement of a new drain will be scheduled.  CT guidance will be helpful in placing a new drain more towards the midline in order to optimize drainage of the residual seroma.   Electronically Signed: Azzie Roup 12/06/2018, 4:49 PM     I spent a total of 10 Minutes in face to face in clinical consultation, greater than 50% of which was counseling/coordinating care for an abdominal wall seroma.

## 2018-12-07 ENCOUNTER — Other Ambulatory Visit (HOSPITAL_COMMUNITY): Payer: Self-pay | Admitting: Interventional Radiology

## 2018-12-07 ENCOUNTER — Other Ambulatory Visit: Payer: Self-pay | Admitting: Radiology

## 2018-12-07 DIAGNOSIS — N6489 Other specified disorders of breast: Secondary | ICD-10-CM

## 2018-12-07 MED FILL — AMOX-CLAV 500-125 MG TABLET: 500-125 | 10 days supply | Qty: 20 | Fill #0

## 2018-12-07 MED FILL — HYDROCODON-APAP 5-325: 5-325 | 7 days supply | Qty: 30 | Fill #0

## 2018-12-10 ENCOUNTER — Encounter (HOSPITAL_COMMUNITY): Payer: Self-pay

## 2018-12-10 ENCOUNTER — Other Ambulatory Visit (HOSPITAL_COMMUNITY): Payer: Self-pay | Admitting: Interventional Radiology

## 2018-12-10 ENCOUNTER — Ambulatory Visit (HOSPITAL_COMMUNITY)
Admission: RE | Admit: 2018-12-10 | Discharge: 2018-12-10 | Disposition: A | Discharge status: Home / Self Care | Payer: 59 | Source: Ambulatory Visit | Attending: Interventional Radiology | Admitting: Interventional Radiology

## 2018-12-10 ENCOUNTER — Other Ambulatory Visit: Payer: Self-pay

## 2018-12-10 DIAGNOSIS — Z833 Family history of diabetes mellitus: Secondary | ICD-10-CM | POA: Diagnosis not present

## 2018-12-10 DIAGNOSIS — N6489 Other specified disorders of breast: Secondary | ICD-10-CM | POA: Insufficient documentation

## 2018-12-10 DIAGNOSIS — F419 Anxiety disorder, unspecified: Secondary | ICD-10-CM | POA: Insufficient documentation

## 2018-12-10 DIAGNOSIS — Z79899 Other long term (current) drug therapy: Secondary | ICD-10-CM | POA: Insufficient documentation

## 2018-12-10 DIAGNOSIS — Z79890 Hormone replacement therapy (postmenopausal): Secondary | ICD-10-CM | POA: Insufficient documentation

## 2018-12-10 DIAGNOSIS — K219 Gastro-esophageal reflux disease without esophagitis: Secondary | ICD-10-CM | POA: Insufficient documentation

## 2018-12-10 DIAGNOSIS — E785 Hyperlipidemia, unspecified: Secondary | ICD-10-CM | POA: Diagnosis not present

## 2018-12-10 DIAGNOSIS — E039 Hypothyroidism, unspecified: Secondary | ICD-10-CM | POA: Insufficient documentation

## 2018-12-10 DIAGNOSIS — I10 Essential (primary) hypertension: Secondary | ICD-10-CM | POA: Insufficient documentation

## 2018-12-10 DIAGNOSIS — L7634 Postprocedural seroma of skin and subcutaneous tissue following other procedure: Secondary | ICD-10-CM | POA: Diagnosis not present

## 2018-12-10 DIAGNOSIS — Z794 Long term (current) use of insulin: Secondary | ICD-10-CM | POA: Diagnosis not present

## 2018-12-10 DIAGNOSIS — M069 Rheumatoid arthritis, unspecified: Secondary | ICD-10-CM | POA: Diagnosis not present

## 2018-12-10 DIAGNOSIS — E1142 Type 2 diabetes mellitus with diabetic polyneuropathy: Secondary | ICD-10-CM | POA: Diagnosis not present

## 2018-12-10 LAB — PROTIME-INR
INR: 0.92
Prothrombin Time: 12.2 seconds (ref 11.4–15.2)

## 2018-12-10 LAB — GLUCOSE, CAPILLARY: Glucose-Capillary: 144 mg/dL — ABNORMAL HIGH (ref 70–99)

## 2018-12-10 LAB — CBC
HCT: 39.9 % (ref 36.0–46.0)
HEMOGLOBIN: 12.5 g/dL (ref 12.0–15.0)
MCH: 26.2 pg (ref 26.0–34.0)
MCHC: 31.3 g/dL (ref 30.0–36.0)
MCV: 83.5 fL (ref 80.0–100.0)
Platelets: 294 10*3/uL (ref 150–400)
RBC: 4.78 MIL/uL (ref 3.87–5.11)
RDW: 12.4 % (ref 11.5–15.5)
WBC: 5.5 10*3/uL (ref 4.0–10.5)
nRBC: 0 % (ref 0.0–0.2)

## 2018-12-10 MED ORDER — MIDAZOLAM HCL 5 MG/5ML IJ SOLN
INTRAMUSCULAR | Status: AC | PRN
  Administered 2018-12-10: 0.5 mg via INTRAVENOUS

## 2018-12-10 MED ORDER — SODIUM CHLORIDE 0.9 % IV SOLN: INTRAVENOUS | Status: DC

## 2018-12-10 MED ORDER — FENTANYL CITRATE (PF) 100 MCG/2ML IJ SOLN
INTRAMUSCULAR | Status: AC
  Filled 2018-12-10: qty 2

## 2018-12-10 MED ORDER — MIDAZOLAM HCL 2 MG/2ML IJ SOLN
INTRAMUSCULAR | Status: AC | PRN
  Administered 2018-12-10: 1 mg via INTRAVENOUS

## 2018-12-10 MED ORDER — SODIUM CHLORIDE 0.9% FLUSH: 5.0000 mL | Freq: Three times a day (TID) | INTRAVENOUS | Status: DC

## 2018-12-10 MED ORDER — MIDAZOLAM HCL 2 MG/2ML IJ SOLN
INTRAMUSCULAR | Status: AC
  Filled 2018-12-10: qty 2

## 2018-12-10 MED ORDER — FENTANYL CITRATE (PF) 100 MCG/2ML IJ SOLN
INTRAMUSCULAR | Status: AC | PRN
  Administered 2018-12-10: 50 ug via INTRAVENOUS
  Administered 2018-12-10: 25 ug via INTRAVENOUS

## 2018-12-10 MED ORDER — LIDOCAINE HCL 1 % IJ SOLN
INTRAMUSCULAR | Status: AC
  Filled 2018-12-10: qty 20

## 2018-12-10 NOTE — H&P (Signed)
Chief Complaint: Patient was seen in consultation today for abdominal seroma drain placement at the request of Dr Rockne Coons   Supervising Physician: Arne Cleveland  Patient Status: [facility] - Out-pt  History of Present Illness: Amy Adams is a 53 y.o. female   Panniculectomy 10/26/18- Dr Rockne Coons Seroma development Pt has had multiple seroma drain in place in IR  Most recent [facility] visit 12/06/18:  Per Dr Kathlene Cote:  CT today demonstrates no further fluid surrounding the far right lateral abdominal wall drainage catheter.  The catheter was removed today as it is no longer draining any fluid.  There remains a fairly substantial sized seroma containing a few air bubbles along the entire width of the lower abdominal wall.  Placement of a new drain in the remaining fluid collection will help to resolve the remaining seroma.  Outpatient CT-guided placement of a new drain will be scheduled.  CT guidance will be helpful in placing a new drain more towards the midline in order to optimize drainage of the residual seroma.  Pt left the clinic that day (Wed) and by evening low abd was full; hard and painful Was seen by Dr Hassell Done in his office Friday He "opened wound and placed drain tube" Dr Hassell Done said he would plan to remove tubing Wed of this week in office. She is now scheduled in IR for the OP seroma drain that was discussed with her Wed at [employer].  She states the tubing that Dr Hassell Done placed drains constantly. Wears pads on it always.    Past Medical History:  Diagnosis Date  . Anxiety   . Arthritis   . Chronic foot pain, left   . GERD (gastroesophageal reflux disease)   . History of anal fissures   . Hyperlipidemia   . Hypertension   . Hypothyroidism   . Insulin dependent type 2 diabetes mellitus [facility])    endrocrinologist-- dr Chalmers Cater  . Migraines   . Peripheral neuropathy   . RA (rheumatoid arthritis) [facility])    rheumatologist-  dr Amil Amen  . SUI (stress urinary  incontinence, female)   . Wears glasses     Past Surgical History:  Procedure Laterality Date  . ABDOMINAL HYSTERECTOMY  1990   with Right Salpingo--ophorectomy  . ANAL SPHINCTEROTOMY  01-05-2006    dr Rise Patience @[facility]   . BUNIONECTOMY  09-25-2009   dr Milinda Pointer @[facility]    right great toe  . CARPAL TUNNEL RELEASE Bilateral 1996;  1997  . CESAREAN SECTION  1988  . FOOT SURGERY  12/ 2014   dr Milinda Pointer   left 2nd hammertoe repair,  left 2nd metatarsal matrixectomy, left heel endoscopic plantar fasiotomy  . IR RADIOLOGIST EVAL & MGMT  11/28/2018  . IR RADIOLOGIST EVAL & MGMT  11/29/2018  . IR RADIOLOGIST EVAL & MGMT  12/06/2018  . PANNICULECTOMY N/A 10/26/2018   Procedure: PANNICULECTOMY;  Surgeon: Johnathan Hausen, MD;  Location: [facility];  Service: General;  Laterality: N/A;  . TENDON REPAIR  09/2015   left posterior tibial and peroneal tendon repairs  . TOE AMPUTATION  01/2015   right 2nd toe  . TONSILLECTOMY  age 87  . TRANSOBTURATOR SLING  01-08-2004   dr Jeffie Pollock @[facility]     Allergies: Dilaudid [hydromorphone hcl]; Doxycycline; Topiramate; and Trazodone and nefazodone  Medications: Prior to Admission medications   Medication Sig Start Date End Date Taking? Authorizing Provider  acetaminophen (TYLENOL) 500 MG tablet Take 500 mg by mouth every 6 (six) hours as needed for fever.  Yes [provider]  ALPRAZolam Duanne Moron) 1 MG tablet Take 1 mg by mouth 3 (three) times daily as needed for anxiety.    Yes [provider]  amoxicillin-clavulanate (AUGMENTIN) 500-125 MG tablet Take 500 mg by mouth 2 (two) times daily.   Yes [provider]  cyclobenzaprine (FLEXERIL) 10 MG tablet Take 1 tablet (10 mg total) by mouth 3 (three) times daily. Patient taking differently: Take 10 mg by mouth 3 (three) times daily as needed for muscle spasms.  07/10/18  Yes Hyatt, Max T, DPM  DULoxetine (CYMBALTA) 60 MG capsule Take 120 mg by mouth at bedtime.   Yes [provider]  estradiol  (ESTRACE) 0.5 MG tablet Take 0.5 mg by mouth every evening.    Yes [provider]  fenofibrate micronized (LOFIBRA) 134 MG capsule Take 134 mg by mouth every evening.    Yes [provider]  folic acid (FOLVITE) 1 MG tablet Take 2 mg by mouth daily.    Yes [provider]  gabapentin (NEURONTIN) 400 MG capsule TAKE 1 CAPSULE BY MOUTH 3 TIMES DAILY. Patient taking differently: Take 400 mg by mouth 3 (three) times daily.  01/31/18  Yes Hyatt, Max T, DPM  hydrochlorothiazide (HYDRODIURIL) 25 MG tablet Take 25 mg by mouth daily.   Yes [provider]  hydroxychloroquine (PLAQUENIL) 200 MG tablet Take 400 mg by mouth at bedtime.    Yes [provider]  insulin glargine (LANTUS SOLOSTAR) 100 UNIT/ML injection Inject 25 Units into the skin 2 (two) times daily. Per [place]   Yes [provider]  insulin lispro (HUMALOG KWIKPEN) 100 UNIT/ML injection Inject 6-15 Units into the skin 3 (three) times daily with meals. 6-15 units sliding scale AS NEEDED   Yes [provider]  levocetirizine (XYZAL) 5 MG tablet Take 5 mg by mouth every evening.   Yes [provider]  levothyroxine (SYNTHROID, LEVOTHROID) 50 MCG tablet Take 50 mcg by mouth daily before breakfast.    Yes [provider]  losartan (COZAAR) 100 MG tablet Take 100 mg by mouth daily.   Yes [provider]  metFORMIN (GLUCOPHAGE-XR) 500 MG 24 hr tablet Take 1,000 mg by mouth at bedtime.    Yes [provider]  montelukast (SINGULAIR) 10 MG tablet Take 10 mg by mouth at bedtime.   Yes [provider]  omeprazole (PRILOSEC) 40 MG capsule Take 40 mg by mouth every evening.    Yes [provider]  traMADol (ULTRAM) 50 MG tablet Take 1 tablet (50 mg total) by mouth every 8 (eight) hours as needed. Patient taking differently: Take 50 mg by mouth every 6 (six) hours as needed (pain).  12/13/16  Yes Hyatt, Max T, DPM  zolpidem (AMBIEN CR) 12.5 MG CR  tablet Take 12.5 mg by mouth at bedtime.    Yes [provider]  albuterol (PROVENTIL HFA;VENTOLIN HFA) 108 (90 BASE) MCG/ACT inhaler Inhale 1-2 puffs into the lungs every 6 (six) hours as needed for wheezing or shortness of breath.    [provider]  Continuous Blood Gluc Sensor (FREESTYLE LIBRE 14 DAY SENSOR) MISC USE AS DIRECTED EVERY 2 WEEKS SUBCUTANEOUSLY 05/24/18   [provider]  methotrexate 25 MG/ML SOLN Inject 20 mg into the muscle every Thursday. Inject 0.8 mL once weekly at bedtime. Every Thursday    [provider]  Methotrexate, Anti-Rheumatic, (METHOTREXATE, PF, [place]) Inject 0.8 mLs into the skin once a week. thursday's    [provider]  UNIFINE PENTIPS 31G X 5 MM MISC USE AS DIRECTED 5 TIMES PER DAY 06/19/18   [provider]  valACYclovir (VALTREX) 1000 MG tablet Take 1,000 mg by mouth 2 (two) times daily as needed (fever blisters).  05/01/18   [provider]     Family History  Problem Relation Age of Onset  . Diabetes Mother   . Migraines Mother   . Diabetes Sister     Social History   Socioeconomic History  . Marital status: Divorced    Spouse name: Not on file  . Number of children: Not on file  . Years of education: Not on file  . Highest education level: Not on file  Occupational History  . Not on file  Social Needs  . Financial resource strain: Not on file  . Food insecurity:    Worry: Not on file    Inability: Not on file  . Transportation needs:    Medical: Not on file    Non-medical: Not on file  Tobacco Use  . Smoking status: Never Smoker  . Smokeless tobacco: Never Used  Substance and Sexual Activity  . Alcohol use: No  . Drug use: Never  . Sexual activity: Not on file  Lifestyle  . Physical activity:    Days per week: Not on file    Minutes per session: Not on file  . Stress: Not on file  Relationships  . Social connections:    Talks on phone: Not on file    Gets together: Not  on file    Attends religious service: Not on file    Active member of club or organization: Not on file    Attends meetings of clubs or organizations: Not on file    Relationship status: Not on file  Other Topics Concern  . Not on file  Social History Narrative  . Not on file     Review of Systems: A 12 point ROS discussed and pertinent positives are indicated in the HPI above.  All other systems are negative.  Review of Systems  Constitutional: Negative for activity change, fatigue and fever.  Respiratory: Negative for shortness of breath.   Cardiovascular: Negative for chest pain.  Gastrointestinal: Positive for abdominal pain.  Psychiatric/Behavioral: Negative for behavioral problems and confusion.    Vital Signs: BP (!) 143/99   Pulse 86   Temp 98.2 F (36.8 C)   Ht 5\' 9"  (1.753 m)   Wt 198 lb (89.8 kg)   SpO2 100%   BMI 29.24 kg/m   Physical Exam Vitals signs reviewed.  Cardiovascular:     Rate and Rhythm: Normal rate and regular rhythm.     Heart sounds: Normal heart sounds.  Pulmonary:     Effort: Pulmonary effort is normal.     Breath sounds: Normal breath sounds.  Abdominal:     General: Bowel sounds are normal.     Tenderness: There is abdominal tenderness.  Musculoskeletal: Normal range of motion.  Skin:    General: Skin is warm and dry.     Comments: Bandaged low abd Surgical drain tubing in place   Neurological:     General: No focal deficit present.     Mental Status: She is oriented to person, place, and time.  Psychiatric:        Mood and Affect: Mood normal.        Behavior: Behavior normal.        Thought Content: Thought content normal.  Judgment: Judgment normal.     Imaging: US Guided Needle Placement  Result Date: 11/13/2018 INDICATION: 53 year old with postoperative seroma following a panniculectomy. Patient is complaining of drainage at the incision site. EXAM: ULTRASOUND-GUIDED PLACEMENT OF A DRAIN WITHIN THE LOWER  ABDOMINAL SUBCUTANEOUS SEROMA MEDICATIONS: None ANESTHESIA/SEDATION: Fentanyl 100 mcg IV; Versed 2.0 mg IV Moderate Sedation Time:  15 minutes The patient was continuously monitored during the procedure by the interventional radiology nurse under my direct supervision. COMPLICATIONS: None immediate. PROCEDURE: Informed written consent was obtained from the patient after a thorough discussion of the procedural risks, benefits and alternatives. All questions were addressed. A timeout was performed prior to the initiation of the procedure. The lower abdominal subcutaneous tissues was evaluated with ultrasound. Small amount of fluid was identified in the right lower abdominal subcutaneous tissues. Skin was prepped with chlorhexidine and sterile field was created. Yueh catheter was directed into the fluid collection with ultrasound guidance. Mildly cloudy serosanguineous fluid was aspirated. A stiff Amplatz wire was advanced into the collection and the tract was dilated to accommodate a 10 [place] drain. Approximately 100 mL of the serosanguineous fluid was removed. Catheter was sutured to the skin and attached to a suction bulb. FINDINGS: Small amount of septated fluid in the right lower quadrant subcutaneous tissues. No other significant fluid collections were identified in the subcutaneous tissues. Drain was successfully placed within the right lateral subcutaneous collection and approximately 100 mL of serosanguineous fluid was removed. IMPRESSION: Ultrasound-guided placement of a drainage catheter within the postoperative seroma. Electronically Signed   By: Markus Daft M.D.   On: 11/13/2018 17:29   Ct Abdomen Pelvis W Contrast  Result Date: 12/06/2018 CLINICAL DATA:  Status post percutaneous catheter drainage of postoperative seroma of the abdominal wall after panniculectomy on 11/13/2018. There has been no significant fluid return from the drainage catheter for several days. EXAM: CT ABDOMEN AND PELVIS WITH  CONTRAST TECHNIQUE: Multidetector CT imaging of the abdomen and pelvis was performed using the standard protocol following bolus administration of intravenous contrast. CONTRAST:  160mL ISOVUE-300 IOPAMIDOL (ISOVUE-300) INJECTION 61% COMPARISON:  Prior CT on 11/07/2018 and imaging during ultrasound-guided drain placement on 11/13/2018. FINDINGS: Lower chest: No acute abnormality. Hepatobiliary: No focal liver abnormality is seen. No gallstones, gallbladder wall thickening, or biliary dilatation. Pancreas: Unremarkable. No pancreatic ductal dilatation or surrounding inflammatory changes. Spleen: Normal in size without focal abnormality. Adrenals/Urinary Tract: Adrenal glands are unremarkable. Kidneys are normal, without renal calculi, focal lesion, or hydronephrosis. Bladder is unremarkable. Stomach/Bowel: No evidence of bowel obstruction, ileus or inflammation. No free air identified. Vascular/Lymphatic: No significant vascular findings are present. No enlarged abdominal or pelvic lymph nodes. Reproductive: Status post hysterectomy. No adnexal masses. Other: Percutaneous drain is along the far right lateral aspect of the abdominal wall within the subcutaneous fat. There is no fluid around the drain. Elongated seroma remains along the entire extent of the anterior abdominal wall inferior to the drain. In the midline, the fluid measures as much as 5.7 cm in thickness and contains gas bubbles. Lentiform shaped fluid collection extends along the entire width of the abdominal wall and is centered in the pelvis. Musculoskeletal: No acute or significant osseous findings. IMPRESSION: No further fluid collection is seen surrounding the percutaneous drainage catheter which is in the far right lateral abdominal wall within the subcutaneous fat. There remains a significant lentiform shaped abdominal wall seroma along the entire width of the lower abdominal wall of the pelvis and measuring up to 5.7 cm in thickness  in the  midline. The patient would likely benefit from a new drainage catheter placement which will be set up as an outpatient procedure under CT guidance. The previously placed right lateral abdominal wall drain was removed after the CT study. Please see separately dictated clinic note in Epic. Electronically Signed   By: Aletta Edouard M.D.   On: 12/06/2018 14:40   Ir Radiologist Eval & Mgmt  Result Date: 12/06/2018 Please refer to notes tab for details about interventional procedure. (Op Note)  Ir Radiologist Eval & Mgmt  Result Date: 11/29/2018 Please refer to notes tab for details about interventional procedure. (Op Note)  Ir Radiologist Eval & Mgmt  Result Date: 11/28/2018 Please refer to notes tab for details about interventional procedure. (Op Note)   Labs:  CBC: Recent Labs    10/27/18 0315 11/07/18 1948 11/13/18 1208 12/10/18 1304  WBC 10.4 14.6* 6.4 5.5  HGB 12.2 11.6* 12.3 12.5  HCT 37.1 35.4* 38.7 39.9  PLT 228 219 385 294    COAGS: Recent Labs    11/13/18 1208 12/10/18 1304  INR 0.97 0.92    BMP: Recent Labs    10/22/18 1620 10/26/18 1550 10/27/18 0315 11/07/18 1948 11/13/18 1208  NA 138  --  138 136 140  K 4.1  --  4.0 3.4* 3.9  CL 99  --  99 98 102  CO2 28  --  26 25 28   GLUCOSE 122*  --  191* 137* 137*  BUN 16  --  19 27* 10  CALCIUM 9.3  --  9.0 9.0 9.3  CREATININE 0.86 1.26* 1.05* 1.23* 0.95  GFRNONAA >60 48* >60 50* >60  GFRAA >60 56* >60 58* >60    LIVER FUNCTION TESTS: No results for input(s): BILITOT, AST, ALT, ALKPHOS, PROT, ALBUMIN in the last 8760 hours.  TUMOR MARKERS: No results for input(s): AFPTM, CEA, CA199, CHROMGRNA in the last 8760 hours.  Assessment and Plan:  Panniculectomy 10/26/18 Seroma -- persistent refilling Multiple drains in IR Most recent removed Wed 1/23 New surgical drain placed into area by Dr Hassell Done in his office Fri 1/25 Scheduled now for evaluation and possible seroma drain placement Risks and benefits  discussed with the patient including bleeding, infection, damage to adjacent structures, bowel perforation/fistula connection, and sepsis.  All of the patient's questions were answered, patient is agreeable to proceed. Consent signed and in chart.   Thank you for this interesting consult.  I greatly enjoyed meeting ROIZA WIEDEL and look forward to participating in their care.  A copy of this report was sent to the requesting provider on this date.  Electronically Signed: Lavonia Drafts, PA-C 12/10/2018, 2:22 PM   I spent a total of    25 Minutes in face to face in clinical consultation, greater than 50% of which was counseling/coordinating care for seroma drain placement

## 2018-12-10 NOTE — Discharge Instructions (Signed)
Percutaneous Abscess Drain, Care After  This sheet gives you information about how to care for yourself after your procedure. Your health care provider may also give you more specific instructions. If you have problems or questions, contact your health care provider.  What can I expect after the procedure?  After your procedure, it is common to have:  · A small amount of bruising and discomfort in the area where the drainage tube (catheter) was placed.  · Sleepiness and fatigue. This should go away after the medicines you were given have worn off.  Follow these instructions at home:  Incision care  · Follow instructions from your health care provider about how to take care of your incision. Make sure you:  ? Wash your hands with soap and water before you change your bandage (dressing). If soap and water are not available, use hand sanitizer.  ? Change your dressing as told by your health care provider.  ? Leave stitches (sutures), skin glue, or adhesive strips in place. These skin closures may need to stay in place for 2 weeks or longer. If adhesive strip edges start to loosen and curl up, you may trim the loose edges. Do not remove adhesive strips completely unless your health care provider tells you to do that.  · Check your incision area every day for signs of infection. Check for:  ? More redness, swelling, or pain.  ? More fluid or blood.  ? Warmth.  ? Pus or a bad smell.  ? Fluid leaking from around your catheter (instead of fluid draining through your catheter).  Catheter care    · Follow instructions from your health care provider about emptying and cleaning your catheter and collection bag. You may need to clean the catheter every day so it does not clog.  · If directed, write down the following information every time you empty your bag:  ? The date and time.  ? The amount of drainage.  General instructions  · Rest at home for 1-2 days after your procedure. Return to your normal activities as told by your  health care provider.  · Do not take baths, swim, or use a hot tub for 24 hours after your procedure, or until your health care provider says that this is okay.  · Take over-the-counter and prescription medicines only as told by your health care provider.  · Keep all follow-up visits as told by your health care provider. This is important.  Contact a health care provider if:  · You have less than 10 mL of drainage a day for 2-3 days in a row, or as directed by your health care provider.  · You have more redness, swelling, or pain around your incision area.  · You have more fluid or blood coming from your incision area.  · Your incision area feels warm to the touch.  · You have pus or a bad smell coming from your incision area.  · You have fluid leaking from around your catheter (instead of through your catheter).  · You have a fever or chills.  · You have pain that does not get better with medicine.  Get help right away if:  · Your catheter comes out.  · You suddenly stop having drainage from your catheter.  · You suddenly have blood in the fluid that is draining from your catheter.  · You become dizzy or you faint.  · You develop a rash.  · You have nausea or vomiting.  ·   You have difficulty breathing or you feel short of breath.  · You develop chest pain.  · You have problems with your speech or vision.  · You have trouble balancing or moving your arms or legs.  Summary  · It is common to have a small amount of bruising and discomfort in the area where the drainage tube (catheter) was placed.  · You may be directed to record the amount of drainage from the bag every time you empty it.  · Follow instructions from your health care provider about emptying and cleaning your catheter and collection bag.  This information is not intended to replace advice given to you by your health care provider. Make sure you discuss any questions you have with your health care provider.  Document Released: 03/17/2014 Document  Revised: 09/22/2016 Document Reviewed: 09/22/2016  [employer]vier Interactive Patient Education © 2019 [employer]

## 2018-12-11 ENCOUNTER — Ambulatory Visit: Payer: 59 | Admitting: Podiatry

## 2018-12-11 ENCOUNTER — Other Ambulatory Visit: Payer: Self-pay | Admitting: Surgery

## 2018-12-11 DIAGNOSIS — K651 Peritoneal abscess: Secondary | ICD-10-CM

## 2018-12-21 DIAGNOSIS — Z79899 Other long term (current) drug therapy: Secondary | ICD-10-CM | POA: Diagnosis not present

## 2018-12-21 DIAGNOSIS — H40013 Open angle with borderline findings, low risk, bilateral: Secondary | ICD-10-CM | POA: Diagnosis not present

## 2018-12-21 DIAGNOSIS — H40033 Anatomical narrow angle, bilateral: Secondary | ICD-10-CM | POA: Diagnosis not present

## 2018-12-21 DIAGNOSIS — M069 Rheumatoid arthritis, unspecified: Secondary | ICD-10-CM | POA: Diagnosis not present

## 2018-12-25 ENCOUNTER — Encounter: Payer: Self-pay | Admitting: *Deleted

## 2018-12-25 ENCOUNTER — Ambulatory Visit
Admission: RE | Admit: 2018-12-25 | Discharge: 2018-12-25 | Disposition: A | Discharge status: Home / Self Care | Payer: 59 | Source: Ambulatory Visit | Attending: Surgery | Admitting: Surgery

## 2018-12-25 DIAGNOSIS — T85628A Displacement of other specified internal prosthetic devices, implants and grafts, initial encounter: Secondary | ICD-10-CM | POA: Diagnosis not present

## 2018-12-25 DIAGNOSIS — K5730 Diverticulosis of large intestine without perforation or abscess without bleeding: Secondary | ICD-10-CM | POA: Diagnosis not present

## 2018-12-25 DIAGNOSIS — K651 Peritoneal abscess: Secondary | ICD-10-CM

## 2018-12-25 DIAGNOSIS — L7633 Postprocedural seroma of skin and subcutaneous tissue following a dermatologic procedure: Secondary | ICD-10-CM | POA: Diagnosis not present

## 2018-12-25 HISTORY — PX: IR RADIOLOGIST EVAL & MGMT: IMG5224

## 2018-12-25 MED ORDER — IOPAMIDOL (ISOVUE-300) INJECTION 61%
100.0000 mL | Freq: Once | INTRAVENOUS | Status: AC | PRN
  Administered 2018-12-25: 100 mL via INTRAVENOUS

## 2018-12-25 NOTE — Progress Notes (Signed)
Referring Physician(s): Martin,Matthew  Chief Complaint: The patient is seen in follow up today s/p seroma drain placement 12/10/18  History of present illness: Amy Adams is a 53 year old female with history of diabetes, HTN, peripheral neuropathy who had significant weight loss over the past 1-2 years.  She underwent panniculectomy 10/26/18 due to frequent yeast infections.  She presented to [facility] ED 12/31 with abdominal fullness and spontaneous drainage from her incision.  A drainage catheter was placed 11/13/18. This drain remained until 12/06/18 when it was found to no longer be draining fluid despite persistent seroma.  A new 16Fr thal drainage catheter was placed 12/10/18 and remains in place today.  Amy Adams presents today with complaint of ongoing drainage.  She reports she completed antibiotics on Saturday.  No fever, chills, abdominal pain, nausea, or vomiting. She is still having significant drainage and has noticed a new defect in her surgical incision.   Past Medical History:  Diagnosis Date  . Anxiety   . Arthritis   . Chronic foot pain, left   . GERD (gastroesophageal reflux disease)   . History of anal fissures   . Hyperlipidemia   . Hypertension   . Hypothyroidism   . Insulin dependent type 2 diabetes mellitus [facility])    endrocrinologist-- dr Chalmers Cater  . Migraines   . Peripheral neuropathy   . RA (rheumatoid arthritis) [facility])    rheumatologist-  dr Amil Amen  . SUI (stress urinary incontinence, female)   . Wears glasses     Past Surgical History:  Procedure Laterality Date  . ABDOMINAL HYSTERECTOMY  1990   with Right Salpingo--ophorectomy  . ANAL SPHINCTEROTOMY  01-05-2006    dr Rise Patience @[facility]   . Lillard Anes  09-25-2009   dr Milinda Pointer @[facility]    right great toe  . CARPAL TUNNEL RELEASE Bilateral 1996;  1997  . CESAREAN SECTION  1988  . FOOT SURGERY  12/ 2014   dr Milinda Pointer   left 2nd hammertoe repair,  left 2nd metatarsal matrixectomy, left heel endoscopic plantar  fasiotomy  . IR RADIOLOGIST EVAL & MGMT  11/28/2018  . IR RADIOLOGIST EVAL & MGMT  11/29/2018  . IR RADIOLOGIST EVAL & MGMT  12/06/2018  . PANNICULECTOMY N/A 10/26/2018   Procedure: PANNICULECTOMY;  Surgeon: Johnathan Hausen, MD;  Location: [facility]S;  Service: General;  Laterality: N/A;  . TENDON REPAIR  09/2015   left posterior tibial and peroneal tendon repairs  . TOE AMPUTATION  01/2015   right 2nd toe  . TONSILLECTOMY  age 1  . TRANSOBTURATOR SLING  01-08-2004   dr Jeffie Pollock @[facility]     Allergies: Dilaudid [hydromorphone hcl]; Doxycycline; Topiramate; and Trazodone and nefazodone  Medications: Prior to Admission medications   Medication Sig Start Date End Date Taking? Authorizing Provider  acetaminophen (TYLENOL) 500 MG tablet Take 500 mg by mouth every 6 (six) hours as needed for fever.    [provider]  albuterol (PROVENTIL HFA;VENTOLIN HFA) 108 (90 BASE) MCG/ACT inhaler Inhale 1-2 puffs into the lungs every 6 (six) hours as needed for wheezing or shortness of breath.    [provider]  ALPRAZolam Duanne Moron) 1 MG tablet Take 1 mg by mouth 3 (three) times daily as needed for anxiety.     [provider]  amoxicillin-clavulanate (AUGMENTIN) 500-125 MG tablet Take 500 mg by mouth 2 (two) times daily.    [provider]  Continuous Blood Gluc Sensor (FREESTYLE LIBRE 14 DAY SENSOR) MISC USE AS DIRECTED EVERY 2 WEEKS SUBCUTANEOUSLY 05/24/18  [provider]  cyclobenzaprine (FLEXERIL) 10 MG tablet Take 1 tablet (10 mg total) by mouth 3 (three) times daily. Patient taking differently: Take 10 mg by mouth 3 (three) times daily as needed for muscle spasms.  07/10/18   Hyatt, Max T, DPM  DULoxetine (CYMBALTA) 60 MG capsule Take 120 mg by mouth at bedtime.    [provider]  estradiol (ESTRACE) 0.5 MG tablet Take 0.5 mg by mouth every evening.     [provider]  fenofibrate micronized (LOFIBRA) 134 MG capsule Take 134 mg by mouth every  evening.     [provider]  folic acid (FOLVITE) 1 MG tablet Take 2 mg by mouth daily.     [provider]  gabapentin (NEURONTIN) 400 MG capsule TAKE 1 CAPSULE BY MOUTH 3 TIMES DAILY. Patient taking differently: Take 400 mg by mouth 3 (three) times daily.  01/31/18   Hyatt, Max T, DPM  hydrochlorothiazide (HYDRODIURIL) 25 MG tablet Take 25 mg by mouth daily.    [provider]  hydroxychloroquine (PLAQUENIL) 200 MG tablet Take 400 mg by mouth at bedtime.     [provider]  insulin glargine (LANTUS SOLOSTAR) 100 UNIT/ML injection Inject 25 Units into the skin 2 (two) times daily. Per [place]    [provider]  insulin lispro (HUMALOG KWIKPEN) 100 UNIT/ML injection Inject 6-15 Units into the skin 3 (three) times daily with meals. 6-15 units sliding scale AS NEEDED    [provider]  levocetirizine (XYZAL) 5 MG tablet Take 5 mg by mouth every evening.    [provider]  levothyroxine (SYNTHROID, LEVOTHROID) 50 MCG tablet Take 50 mcg by mouth daily before breakfast.     [provider]  losartan (COZAAR) 100 MG tablet Take 100 mg by mouth daily.    [provider]  metFORMIN (GLUCOPHAGE-XR) 500 MG 24 hr tablet Take 1,000 mg by mouth at bedtime.     [provider]  methotrexate 25 MG/ML SOLN Inject 20 mg into the muscle every Thursday. Inject 0.8 mL once weekly at bedtime. Every Thursday    [provider]  Methotrexate, Anti-Rheumatic, (METHOTREXATE, PF, [place]) Inject 0.8 mLs into the skin once a week. thursday's    [provider]  montelukast (SINGULAIR) 10 MG tablet Take 10 mg by mouth at bedtime.    [provider]  omeprazole (PRILOSEC) 40 MG capsule Take 40 mg by mouth every evening.     [provider]  traMADol (ULTRAM) 50 MG tablet Take 1 tablet (50 mg total) by mouth every 8 (eight) hours as needed. Patient taking differently: Take 50 mg by mouth every 6 (six) hours as  needed (pain).  12/13/16   Hyatt, Max T, DPM  UNIFINE PENTIPS 31G X 5 MM MISC USE AS DIRECTED 5 TIMES PER DAY 06/19/18   [provider]  valACYclovir (VALTREX) 1000 MG tablet Take 1,000 mg by mouth 2 (two) times daily as needed (fever blisters).  05/01/18   [provider]  zolpidem (AMBIEN CR) 12.5 MG CR tablet Take 12.5 mg by mouth at bedtime.     [provider]     Family History  Problem Relation Age of Onset  . Diabetes Mother   . Migraines Mother   . Diabetes Sister     Social History   Socioeconomic History  . Marital status: Divorced    Spouse name: Not on file  . Number of children: Not on file  . Years  of education: Not on file  . Highest education level: Not on file  Occupational History  . Not on file  Social Needs  . Financial resource strain: Not on file  . Food insecurity:    Worry: Not on file    Inability: Not on file  . Transportation needs:    Medical: Not on file    Non-medical: Not on file  Tobacco Use  . Smoking status: Never Smoker  . Smokeless tobacco: Never Used  Substance and Sexual Activity  . Alcohol use: No  . Drug use: Never  . Sexual activity: Not on file  Lifestyle  . Physical activity:    Days per week: Not on file    Minutes per session: Not on file  . Stress: Not on file  Relationships  . Social connections:    Talks on phone: Not on file    Gets together: Not on file    Attends religious service: Not on file    Active member of club or organization: Not on file    Attends meetings of clubs or organizations: Not on file    Relationship status: Not on file  Other Topics Concern  . Not on file  Social History Narrative  . Not on file     Vital Signs: There were no vitals taken for this visit.  Physical Exam  NAD, not ill-appearing Abdomen:  Soft, mildly distended, non-tender to palpation.  Surgical scar with small defect to the right of midline with small area of protrusion.  Drain remains intact  with hazy, serosanguinous output. Site is slightly enlarged likely due to body habitus but remains intact, clean, and dry.  No sign of infection; no erythema or warmth.   Imaging: No results found.  Labs:  CBC: Recent Labs    10/27/18 0315 11/07/18 1948 11/13/18 1208 12/10/18 1304  WBC 10.4 14.6* 6.4 5.5  HGB 12.2 11.6* 12.3 12.5  HCT 37.1 35.4* 38.7 39.9  PLT 228 219 385 294    COAGS: Recent Labs    11/13/18 1208 12/10/18 1304  INR 0.97 0.92    BMP: Recent Labs    10/22/18 1620 10/26/18 1550 10/27/18 0315 11/07/18 1948 11/13/18 1208  NA 138  --  138 136 140  K 4.1  --  4.0 3.4* 3.9  CL 99  --  99 98 102  CO2 28  --  26 25 28   GLUCOSE 122*  --  191* 137* 137*  BUN 16  --  19 27* 10  CALCIUM 9.3  --  9.0 9.0 9.3  CREATININE 0.86 1.26* 1.05* 1.23* 0.95  GFRNONAA >60 48* >60 50* >60  GFRAA >60 56* >60 58* >60    LIVER FUNCTION TESTS: No results for input(s): BILITOT, AST, ALT, ALKPHOS, PROT, ALBUMIN in the last 8760 hours.  Assessment: Post-op seroma s/p panniculectomy  Drain placed 11/13/18 >> removed 12/06/17  Drain placed 12/10/18 remains in place Patient presents for follow-up of her drain.  CT scan obtained today shows ongoing fluid collection which continues to drain significant amounts of fluid daily.  Patient is being followed closely by her surgeon.  Imaging reviewed by Dr. Earleen Newport who has discussed findings with the patient.  She will be scheduled for a new drainage catheter placement using existing tract.    Signed: Docia Barrier, PA 12/25/2018, 1:21 PM   Please refer to Dr. Earleen Newport attestation of this note for management and plan.

## 2018-12-27 DIAGNOSIS — E039 Hypothyroidism, unspecified: Secondary | ICD-10-CM | POA: Diagnosis not present

## 2018-12-27 DIAGNOSIS — G609 Hereditary and idiopathic neuropathy, unspecified: Secondary | ICD-10-CM | POA: Diagnosis not present

## 2018-12-27 DIAGNOSIS — E7800 Pure hypercholesterolemia, unspecified: Secondary | ICD-10-CM | POA: Diagnosis not present

## 2018-12-27 DIAGNOSIS — E1165 Type 2 diabetes mellitus with hyperglycemia: Secondary | ICD-10-CM | POA: Diagnosis not present

## 2018-12-27 DIAGNOSIS — I10 Essential (primary) hypertension: Secondary | ICD-10-CM | POA: Diagnosis not present

## 2018-12-27 DIAGNOSIS — E049 Nontoxic goiter, unspecified: Secondary | ICD-10-CM | POA: Diagnosis not present

## 2018-12-28 ENCOUNTER — Other Ambulatory Visit: Payer: Self-pay | Admitting: Endocrinology

## 2018-12-28 DIAGNOSIS — E049 Nontoxic goiter, unspecified: Secondary | ICD-10-CM

## 2019-01-01 ENCOUNTER — Other Ambulatory Visit: Payer: Self-pay | Admitting: Surgery

## 2019-01-01 ENCOUNTER — Ambulatory Visit (HOSPITAL_COMMUNITY)
Admission: RE | Admit: 2019-01-01 | Discharge: 2019-01-01 | Disposition: A | Discharge status: Home / Self Care | Payer: 59 | Source: Ambulatory Visit | Attending: Surgery | Admitting: Surgery

## 2019-01-01 ENCOUNTER — Encounter (HOSPITAL_COMMUNITY): Payer: Self-pay | Admitting: Interventional Radiology

## 2019-01-01 DIAGNOSIS — K651 Peritoneal abscess: Secondary | ICD-10-CM | POA: Insufficient documentation

## 2019-01-01 DIAGNOSIS — Z4803 Encounter for change or removal of drains: Secondary | ICD-10-CM | POA: Insufficient documentation

## 2019-01-01 DIAGNOSIS — L7634 Postprocedural seroma of skin and subcutaneous tissue following other procedure: Secondary | ICD-10-CM | POA: Diagnosis not present

## 2019-01-01 HISTORY — PX: IR CATHETER TUBE CHANGE: IMG717

## 2019-01-01 MED ORDER — SODIUM CHLORIDE 0.9% FLUSH: 5.0000 mL | Freq: Three times a day (TID) | INTRAVENOUS | Status: DC

## 2019-01-01 MED ORDER — IOPAMIDOL (ISOVUE-300) INJECTION 61%
INTRAVENOUS | Status: AC
  Administered 2019-01-01: 10 mL
  Filled 2019-01-01: qty 50

## 2019-01-01 MED ORDER — LIDOCAINE HCL (PF) 1 % IJ SOLN
INTRAMUSCULAR | Status: AC | PRN
  Administered 2019-01-01: 10 mL

## 2019-01-01 MED ORDER — LIDOCAINE HCL 1 % IJ SOLN
INTRAMUSCULAR | Status: AC
  Filled 2019-01-01: qty 20

## 2019-01-01 NOTE — Procedures (Signed)
Abdominal wall seroma drain  Sp fluoro exchg and reposition No comp Stable ebl 0 Full report in pacs

## 2019-01-03 ENCOUNTER — Other Ambulatory Visit: Payer: Self-pay | Admitting: Surgery

## 2019-01-03 DIAGNOSIS — L02211 Cutaneous abscess of abdominal wall: Secondary | ICD-10-CM

## 2019-01-04 ENCOUNTER — Ambulatory Visit
Admission: RE | Admit: 2019-01-04 | Discharge: 2019-01-04 | Disposition: A | Discharge status: Home / Self Care | Payer: 59 | Source: Ambulatory Visit | Attending: Endocrinology | Admitting: Endocrinology

## 2019-01-04 DIAGNOSIS — E049 Nontoxic goiter, unspecified: Secondary | ICD-10-CM

## 2019-01-04 DIAGNOSIS — E010 Iodine-deficiency related diffuse (endemic) goiter: Secondary | ICD-10-CM | POA: Diagnosis not present

## 2019-01-08 ENCOUNTER — Ambulatory Visit
Admission: RE | Admit: 2019-01-08 | Discharge: 2019-01-08 | Disposition: A | Discharge status: Home / Self Care | Payer: 59 | Source: Ambulatory Visit | Attending: Surgery | Admitting: Surgery

## 2019-01-08 ENCOUNTER — Encounter: Payer: Self-pay | Admitting: Radiology

## 2019-01-08 ENCOUNTER — Other Ambulatory Visit: Payer: Self-pay | Admitting: Surgery

## 2019-01-08 DIAGNOSIS — L02211 Cutaneous abscess of abdominal wall: Secondary | ICD-10-CM

## 2019-01-08 DIAGNOSIS — N281 Cyst of kidney, acquired: Secondary | ICD-10-CM | POA: Diagnosis not present

## 2019-01-08 DIAGNOSIS — K651 Peritoneal abscess: Secondary | ICD-10-CM | POA: Diagnosis not present

## 2019-01-08 HISTORY — PX: IR RADIOLOGIST EVAL & MGMT: IMG5224

## 2019-01-08 MED ORDER — IOPAMIDOL (ISOVUE-300) INJECTION 61%
100.0000 mL | Freq: Once | INTRAVENOUS | Status: AC | PRN
  Administered 2019-01-08: 100 mL via INTRAVENOUS

## 2019-01-08 NOTE — Progress Notes (Addendum)
Referring Physician(s): Martin,Matthew  Chief Complaint: The patient is seen in follow up today s/p abdominal wall seroma drain placement on 11/13/2018, removal on 12/06/2017, new anterior pelvic drain placement 12/10/18 and drain exchange on 01/01/19  History of present illness: [name] is a 53 year old female with history of diabetes, hypertension, peripheral neuropathy, status post panniculectomy on 10/26/2018 due to frequent yeast infections.  She has since undergone abdominal wall seroma drain placements on 11/13/2018 as well as 12/10/2018 with most recent exchange on 01/01/2019.  She presents again today for routine follow-up clinic visit and CT scan.  She currently denies fever, headache, chest pain, dyspnea, cough, abdominal/back pain, nausea, vomiting or bleeding.  She is averaging approximately 100 cc of blood-tinged output from drain daily.  She is not flushing drain.  She is currently not on antibiotics.   Past Medical History:  Diagnosis Date  . Anxiety   . Arthritis   . Chronic foot pain, left   . GERD (gastroesophageal reflux disease)   . History of anal fissures   . Hyperlipidemia   . Hypertension   . Hypothyroidism   . Insulin dependent type 2 diabetes mellitus [facility])    endrocrinologist-- dr Chalmers Cater  . Migraines   . Peripheral neuropathy   . RA (rheumatoid arthritis) [facility])    rheumatologist-  dr Amil Amen  . SUI (stress urinary incontinence, female)   . Wears glasses     Past Surgical History:  Procedure Laterality Date  . ABDOMINAL HYSTERECTOMY  1990   with Right Salpingo--ophorectomy  . ANAL SPHINCTEROTOMY  01-05-2006    dr Rise Patience @[facility]   . BUNIONECTOMY  09-25-2009   dr Milinda Pointer @[facility]    right great toe  . CARPAL TUNNEL RELEASE Bilateral 1996;  1997  . CESAREAN SECTION  1988  . FOOT SURGERY  12/ 2014   dr Milinda Pointer   left 2nd hammertoe repair,  left 2nd metatarsal matrixectomy, left heel endoscopic plantar fasiotomy  . IR CATHETER TUBE CHANGE  01/01/2019  . IR  RADIOLOGIST EVAL & MGMT  11/28/2018  . IR RADIOLOGIST EVAL & MGMT  11/29/2018  . IR RADIOLOGIST EVAL & MGMT  12/06/2018  . IR RADIOLOGIST EVAL & MGMT  12/25/2018  . PANNICULECTOMY N/A 10/26/2018   Procedure: PANNICULECTOMY;  Surgeon: Johnathan Hausen, MD;  Location: [facility];  Service: General;  Laterality: N/A;  . TENDON REPAIR  09/2015   left posterior tibial and peroneal tendon repairs  . TOE AMPUTATION  01/2015   right 2nd toe  . TONSILLECTOMY  age 75  . TRANSOBTURATOR SLING  01-08-2004   dr Jeffie Pollock @[facility]     Allergies: Dilaudid [hydromorphone hcl]; Doxycycline; Topiramate; and Trazodone and nefazodone  Medications: Prior to Admission medications   Medication Sig Start Date End Date Taking? Authorizing Provider  acetaminophen (TYLENOL) 500 MG tablet Take 500 mg by mouth every 6 (six) hours as needed for fever.    [provider]  albuterol (PROVENTIL HFA;VENTOLIN HFA) 108 (90 BASE) MCG/ACT inhaler Inhale 1-2 puffs into the lungs every 6 (six) hours as needed for wheezing or shortness of breath.    [provider]  ALPRAZolam Duanne Moron) 1 MG tablet Take 1 mg by mouth 3 (three) times daily as needed for anxiety.     [provider]  amoxicillin-clavulanate (AUGMENTIN) 500-125 MG tablet Take 500 mg by mouth 2 (two) times daily.    [provider]  Continuous Blood Gluc Sensor (FREESTYLE LIBRE 14 DAY SENSOR) MISC USE AS DIRECTED EVERY 2 WEEKS SUBCUTANEOUSLY 05/24/18  [provider]  cyclobenzaprine (FLEXERIL) 10 MG tablet Take 1 tablet (10 mg total) by mouth 3 (three) times daily. Patient taking differently: Take 10 mg by mouth 3 (three) times daily as needed for muscle spasms.  07/10/18   Hyatt, Max T, DPM  DULoxetine (CYMBALTA) 60 MG capsule Take 120 mg by mouth at bedtime.    [provider]  estradiol (ESTRACE) 0.5 MG tablet Take 0.5 mg by mouth every evening.     [provider]  fenofibrate micronized (LOFIBRA) 134 MG capsule Take  134 mg by mouth every evening.     [provider]  folic acid (FOLVITE) 1 MG tablet Take 2 mg by mouth daily.     [provider]  gabapentin (NEURONTIN) 400 MG capsule TAKE 1 CAPSULE BY MOUTH 3 TIMES DAILY. Patient taking differently: Take 400 mg by mouth 3 (three) times daily.  01/31/18   Hyatt, Max T, DPM  hydrochlorothiazide (HYDRODIURIL) 25 MG tablet Take 25 mg by mouth daily.    [provider]  hydroxychloroquine (PLAQUENIL) 200 MG tablet Take 400 mg by mouth at bedtime.     [provider]  insulin glargine (LANTUS SOLOSTAR) 100 UNIT/ML injection Inject 25 Units into the skin 2 (two) times daily. Per [place]    [provider]  insulin lispro (HUMALOG KWIKPEN) 100 UNIT/ML injection Inject 6-15 Units into the skin 3 (three) times daily with meals. 6-15 units sliding scale AS NEEDED    [provider]  levocetirizine (XYZAL) 5 MG tablet Take 5 mg by mouth every evening.    [provider]  levothyroxine (SYNTHROID, LEVOTHROID) 50 MCG tablet Take 50 mcg by mouth daily before breakfast.     [provider]  losartan (COZAAR) 100 MG tablet Take 100 mg by mouth daily.    [provider]  metFORMIN (GLUCOPHAGE-XR) 500 MG 24 hr tablet Take 1,000 mg by mouth at bedtime.     [provider]  methotrexate 25 MG/ML SOLN Inject 20 mg into the muscle every Thursday. Inject 0.8 mL once weekly at bedtime. Every Thursday    [provider]  Methotrexate, Anti-Rheumatic, (METHOTREXATE, PF, Hydro) Inject 0.8 mLs into the skin once a week. thursday's    [provider]  montelukast (SINGULAIR) 10 MG tablet Take 10 mg by mouth at bedtime.    [provider]  omeprazole (PRILOSEC) 40 MG capsule Take 40 mg by mouth every evening.     [provider]  traMADol (ULTRAM) 50 MG tablet Take 1 tablet (50 mg total) by mouth every 8 (eight) hours as needed. Patient taking differently: Take 50 mg by mouth  every 6 (six) hours as needed (pain).  12/13/16   Hyatt, Max T, DPM  UNIFINE PENTIPS 31G X 5 MM MISC USE AS DIRECTED 5 TIMES PER DAY 06/19/18   [provider]  valACYclovir (VALTREX) 1000 MG tablet Take 1,000 mg by mouth 2 (two) times daily as needed (fever blisters).  05/01/18   [provider]  zolpidem (AMBIEN CR) 12.5 MG CR tablet Take 12.5 mg by mouth at bedtime.     [provider]     Family History  Problem Relation Age of Onset  . Diabetes Mother   . Migraines Mother   . Diabetes Sister     Social History   Socioeconomic History  . Marital status: Divorced    Spouse name: Not on file  . Number of children: Not on file  . Years  of education: Not on file  . Highest education level: Not on file  Occupational History  . Not on file  Social Needs  . Financial resource strain: Not on file  . Food insecurity:    Worry: Not on file    Inability: Not on file  . Transportation needs:    Medical: Not on file    Non-medical: Not on file  Tobacco Use  . Smoking status: Never Smoker  . Smokeless tobacco: Never Used  Substance and Sexual Activity  . Alcohol use: No  . Drug use: Never  . Sexual activity: Not on file  Lifestyle  . Physical activity:    Days per week: Not on file    Minutes per session: Not on file  . Stress: Not on file  Relationships  . Social connections:    Talks on phone: Not on file    Gets together: Not on file    Attends religious service: Not on file    Active member of club or organization: Not on file    Attends meetings of clubs or organizations: Not on file    Relationship status: Not on file  Other Topics Concern  . Not on file  Social History Narrative  . Not on file     Vital Signs:There were no vitals filed for this visit.     Physical Exam awake, alert.  Chest clear to auscultation bilaterally.  Heart with regular rate and rhythm.  Abdomen soft, obese, positive bowel sounds, anterior pelvic drain in  place, draining slightly turbid blood-tinged fluid.  Insertion site not significantly tender.  Imaging: Ct Abdomen Pelvis W Contrast  Result Date: 01/08/2019 CLINICAL DATA:  53 year old female with a history of seroma formation status post panniculectomy which was treated by placement of a percutaneous drain (16 [place]) on 12/10/2018. The drainage catheter was subsequently repositioned on 01/01/2019. drain output remains copious at nearly 100 mL per day. Patient presents for repeat imaging and assessment. EXAM: CT ABDOMEN AND PELVIS WITH CONTRAST TECHNIQUE: Multidetector CT imaging of the abdomen and pelvis was performed using the standard protocol following bolus administration of intravenous contrast. CONTRAST:  152mL ISOVUE-300 IOPAMIDOL (ISOVUE-300) INJECTION 61% COMPARISON:  Most recent prior CT scan of the abdomen and pelvis 12/06/2018 FINDINGS: Lower chest: The lung bases are clear. Visualized cardiac structures are within normal limits for size. No pericardial effusion. Unremarkable visualized distal thoracic esophagus. Hepatobiliary: Normal hepatic contour and morphology. No discrete hepatic lesions. Normal appearance of the gallbladder. No intra or extrahepatic biliary ductal dilatation. Pancreas: Unremarkable. No pancreatic ductal dilatation or surrounding inflammatory changes. Spleen: Normal in size without focal abnormality. Adrenals/Urinary Tract: Adrenal glands are unremarkable. Kidneys are normal, without renal calculi, focal solid lesion, or hydronephrosis. Simple cyst in the interpolar region of the left kidney. Bladder is unremarkable. Stomach/Bowel: No evidence of obstruction or focal bowel wall thickening. Normal appendix in the right lower quadrant. The terminal ileum is unremarkable. Vascular/Lymphatic: No significant vascular findings are present. No enlarged abdominal or pelvic lymph nodes. Reproductive: Status post hysterectomy. No adnexal masses. Other: Percutaneous drainage  catheter remains in good position within the surgical site of the low midline anterior abdominal wall. The previously identified fluid collection appears to have completely resolved. There is some persistent wispy soft tissue density surrounding the drainage catheter consistent with a region of healing surgical bed. No subcutaneous emphysema. No undrained fluid collection is visible. No evidence of hernia or ascites. Musculoskeletal: No acute fracture or aggressive appearing lytic or blastic  osseous lesion. IMPRESSION: 1. Well-positioned percutaneous drainage catheter with interval resolution of fluid and gas from within the surgical bed. 2. Additional ancillary findings as above without significant interval change. Electronically Signed   By: Jacqulynn Cadet M.D.   On: 01/08/2019 13:05   US Thyroid  Result Date: 01/04/2019 CLINICAL DATA:  Nontoxic goiter . Previous FNA biopsy of mid right and mid left nodules 09/24/2015. EXAM: THYROID ULTRASOUND TECHNIQUE: Ultrasound examination of the thyroid gland and adjacent soft tissues was performed. COMPARISON:  03/17/2017 and previous back to 06/17/2013 FINDINGS: Parenchymal Echotexture: Moderately heterogenous Isthmus: 0.8 cm thickness, previously 0.6 Right lobe: 5.7 x 2.2 x 2.9 cm, previously 7.6 x 2.8 x 2.9 Left lobe: 4.1 x 1.3 x 1.7 cm, previously 4.3 x 1.5 x 1.7 _________________________________________________________ Estimated total number of nodules >/= 1 cm: 6-10 Number of spongiform nodules >/=  2 cm not described below (TR1): 0 Number of mixed cystic and solid nodules >/= 1.5 cm not described below ([place]): 0 _________________________________________________________ Nodule # 1: Prior biopsy: No Location: Isthmus; right of midline Maximum size: 1.9 cm; Other 2 dimensions: 1.4 x 0.8 cm, previously, 1.5 x 1.4 x 0.7 cm Composition: solid/almost completely solid (2) Echogenicity: isoechoic (1) Shape: not taller-than-wide (0) Margins: ill-defined (0) Echogenic  foci: none (0) ACR TI-RADS total points: 3. ACR TI-RADS risk category:  TR3 (3 points). Significant change in size (>/= 20% in two dimensions and minimal increase of 2 mm): No Change in features: No Change in ACR TI-RADS risk category: No ACR TI-RADS recommendations: *Given size (>/= 1.5 - 2.4 cm) and appearance, a follow-up ultrasound in 1 year should be considered based on TI-RADS criteria. _________________________________________________________ Nodule # 2: 1.4 x 1.1 x 0.8 cm mixed solid/cystic isoechoic nodule, mid right, stable; This nodule does NOT meet TI-RADS criteria for biopsy or dedicated follow-up. Nodule # 3: 2.2 x 1.9 x 1.5 cm mid right, previously 2.4 x 1.8 x 1.5; this was previously biopsied Nodule #4: 1 cm isoechoic nodule without calcifications, posterior mid right, previously 1.2; This nodule does NOT meet TI-RADS criteria for biopsy or dedicated follow-up. Nodule # 5: 1.8 cm TR 2 nodule, inferior right, previously 2 cm; This nodule does NOT meet TI-RADS criteria for biopsy or dedicated follow-up. Nodule # 6: 1.9 x 1.1 x 0.9 cm mid left nodule, previously 1.7 x 1.4 x 0.9; this was previously biopsied Nodule # 7: 0.9 cm mixed solid/cystic nodule, inferior left, previously 1.1; This nodule does NOT meet TI-RADS criteria for biopsy or dedicated follow-up. Nodule # 8: 0.8 cm nearly isoechoic superior right nodule without calcifications, This nodule does NOT meet TI-RADS criteria for biopsy or dedicated follow-up. Nodule # 9: 1 cm isoechoic mid right nodule without calcifications; This nodule does NOT meet TI-RADS criteria for biopsy or dedicated follow-up. IMPRESSION: 1. Asymmetric thyromegaly with stable bilateral nodules. None currently meets criteria for biopsy. 2. Consider annual/biennial ultrasound follow-up of isthmic nodule as above, until stability x5 years confirmed. The above is in keeping with the ACR TI-RADS recommendations - J Am Coll Radiol 2017;14:587-595. Electronically Signed   By: Lucrezia Europe M.D.   On: 01/04/2019 16:01    Labs:  CBC: Recent Labs    10/27/18 0315 11/07/18 1948 11/13/18 1208 12/10/18 1304  WBC 10.4 14.6* 6.4 5.5  HGB 12.2 11.6* 12.3 12.5  HCT 37.1 35.4* 38.7 39.9  PLT 228 219 385 294    COAGS: Recent Labs    11/13/18 1208 12/10/18 1304  INR 0.97 0.92  BMP: Recent Labs    10/22/18 1620 10/26/18 1550 10/27/18 0315 11/07/18 1948 11/13/18 1208  NA 138  --  138 136 140  K 4.1  --  4.0 3.4* 3.9  CL 99  --  99 98 102  CO2 28  --  26 25 28   GLUCOSE 122*  --  191* 137* 137*  BUN 16  --  19 27* 10  CALCIUM 9.3  --  9.0 9.0 9.3  CREATININE 0.86 1.26* 1.05* 1.23* 0.95  GFRNONAA >60 48* >60 50* >60  GFRAA >60 56* >60 58* >60    LIVER FUNCTION TESTS: No results for input(s): BILITOT, AST, ALT, ALKPHOS, PROT, ALBUMIN in the last 8760 hours.  Assessment: 53 year old female with history of diabetes, hypertension, peripheral neuropathy, status post panniculectomy on 10/26/2018 due to frequent yeast infections.  She has since undergone abdominal wall seroma drain placements on 11/13/2018 as well as 12/10/2018 with most recent exchange on 01/01/2019.  She presents again today for routine follow-up clinic visit and CT scan.  Patient currently stable and without significant complaints.  CT scan today reveals well-positioned drain with interval resolution of fluid and gas from within the surgical bed.  Due to persistent significant drain output decision made to maintain drain for now with follow-up clinic visit in 1 month.  Patient is scheduled to follow-up with Dr. Hassell Done on 01/17/2019.  If output from drain diminishes to around 10 cc/day patient can contact our service and arrange for drain removal in interim.  If drain output continues to remain high then may need to consider alcohol sclerosis of seroma.   Signed: D. Rowe Robert, PA-C 01/08/2019, 1:12 PM   Please refer to Dr. Katrinka Blazing attestation of this note for management and plan.       Patient ID: Amy Adams, female   DOB: 05/06/1966, 53 y.o.   MRN: [id number]

## 2019-01-17 DIAGNOSIS — Z79899 Other long term (current) drug therapy: Secondary | ICD-10-CM | POA: Diagnosis not present

## 2019-01-17 DIAGNOSIS — M2550 Pain in unspecified joint: Secondary | ICD-10-CM | POA: Diagnosis not present

## 2019-01-17 DIAGNOSIS — M25512 Pain in left shoulder: Secondary | ICD-10-CM | POA: Diagnosis not present

## 2019-01-17 DIAGNOSIS — M5136 Other intervertebral disc degeneration, lumbar region: Secondary | ICD-10-CM | POA: Diagnosis not present

## 2019-01-17 DIAGNOSIS — M150 Primary generalized (osteo)arthritis: Secondary | ICD-10-CM | POA: Diagnosis not present

## 2019-01-17 DIAGNOSIS — Z6830 Body mass index (BMI) 30.0-30.9, adult: Secondary | ICD-10-CM | POA: Diagnosis not present

## 2019-01-17 DIAGNOSIS — L659 Nonscarring hair loss, unspecified: Secondary | ICD-10-CM | POA: Diagnosis not present

## 2019-01-17 DIAGNOSIS — M0589 Other rheumatoid arthritis with rheumatoid factor of multiple sites: Secondary | ICD-10-CM | POA: Diagnosis not present

## 2019-01-17 DIAGNOSIS — E669 Obesity, unspecified: Secondary | ICD-10-CM | POA: Diagnosis not present

## 2019-01-23 MED FILL — UNIFINE PENTIPS 31GX3/16: 31G X 5 MM | 80 days supply | Qty: 400 | Fill #2

## 2019-01-23 MED FILL — LEVOTHYROXINE 50 MCG TABLET: 50 | 90 days supply | Qty: 90 | Fill #2

## 2019-01-23 MED FILL — LEVOCETIRIZINE 5 MG TABLET: 5 | 90 days supply | Qty: 90 | Fill #1

## 2019-01-23 MED FILL — GABAPENTIN 400 MG CAPSULE: 400 | 90 days supply | Qty: 270 | Fill #1

## 2019-01-23 MED FILL — valACYclovir HCL 1 GM TABS: 1 | 3 days supply | Qty: 12 | Fill #2

## 2019-01-23 MED FILL — LANTUS SOLOSTAR 100 UNITS/M: 100 | 24 days supply | Qty: 30 | Fill #0

## 2019-01-23 MED FILL — ALPRAZolam 1 MG TABS: 1 | 90 days supply | Qty: 270 | Fill #1

## 2019-01-23 MED FILL — UNIFINE PENTIPS 31GX3/16": 31G X 5 MM | 80 days supply | Qty: 400 | Fill #2

## 2019-01-23 MED FILL — HUMALOG 100 UNITS/ML KWIKPE: 100 | 50 days supply | Qty: 45 | Fill #0

## 2019-01-23 MED FILL — ZOLPIDEM TART ER 12.5 MG TA: 12.5 | 90 days supply | Qty: 90 | Fill #1

## 2019-01-23 MED FILL — MONTELUKAST SOD 10 MG TAB: 10 | 90 days supply | Qty: 90 | Fill #1

## 2019-01-23 MED FILL — HYDROXYCHLOROQUINE SULFATE: 200 | 90 days supply | Qty: 180 | Fill #2

## 2019-01-23 MED FILL — SM ALCOHOL 70% PREP PADS: 70 | 90 days supply | Qty: 400 | Fill #0

## 2019-01-24 ENCOUNTER — Telehealth: Payer: Self-pay | Admitting: *Deleted

## 2019-01-24 MED ORDER — CYCLOBENZAPRINE HCL 10 MG PO TABS: 10.0000 mg | ORAL_TABLET | Freq: Three times a day (TID) | ORAL | 3 refills | Status: DC

## 2019-01-24 MED FILL — CYCLOBENZAPRINE 10 MG TAB: 10 | 30 days supply | Qty: 90 | Fill #3

## 2019-01-24 NOTE — Telephone Encounter (Signed)
Pt requested refill of the flexeril and an appt. [name] scheduler called pt to set up an appt and inform pt the flexeril had been refilled.

## 2019-01-29 ENCOUNTER — Encounter: Payer: Self-pay | Admitting: Radiology

## 2019-01-29 ENCOUNTER — Ambulatory Visit
Admission: RE | Admit: 2019-01-29 | Discharge: 2019-01-29 | Disposition: A | Discharge status: Home / Self Care | Payer: 59 | Source: Ambulatory Visit | Attending: Surgery | Admitting: Surgery

## 2019-01-29 DIAGNOSIS — L7633 Postprocedural seroma of skin and subcutaneous tissue following a dermatologic procedure: Secondary | ICD-10-CM | POA: Diagnosis not present

## 2019-01-29 DIAGNOSIS — L02211 Cutaneous abscess of abdominal wall: Secondary | ICD-10-CM

## 2019-01-29 HISTORY — PX: IR RADIOLOGIST EVAL & MGMT: IMG5224

## 2019-01-29 NOTE — Progress Notes (Signed)
Referring Physician(s): Martin,Matthew  Chief Complaint: The patient is seen in follow up today s/p seroma drain placement   History of present illness: Amy Adams is a 53 year old female with history of diabetes, HTN, peripheral neuropathy who underwent panniculectomy 10/26/18 due to frequent yeast infections.  She developed spontaneous drainage from her incision requiring drainage catheter originally placed 11/13/18. This drain remained until 12/06/18 when it was found to no longer be draining fluid despite persistent seroma.  A new 16Fr thal drainage catheter was then placed 12/10/18 and exchanged 01/01/19. She was last seen for drain evaluation on 01/08/19 at which time CT imaging showed resolve of fluid collection.  Due to continued increased output, the drain was left in place for an additional 3 weeks.  She presents today for clinic visit and assessment of her drain.  Amy Adams presents today in improved condition.  She reports she completed antibiotics and has not experienced fever, chills, abdominal pain, nausea, or vomiting. Her output has significantly decreased and she has had little output from the drain for the past several days.  She is not flushing the drain. She denies abdominal fullness.  Past Medical History:  Diagnosis Date  . Anxiety   . Arthritis   . Chronic foot pain, left   . GERD (gastroesophageal reflux disease)   . History of anal fissures   . Hyperlipidemia   . Hypertension   . Hypothyroidism   . Insulin dependent type 2 diabetes mellitus [facility])    endrocrinologist-- dr Chalmers Cater  . Migraines   . Peripheral neuropathy   . RA (rheumatoid arthritis) [facility])    rheumatologist-  dr Amil Amen  . SUI (stress urinary incontinence, female)   . Wears glasses     Past Surgical History:  Procedure Laterality Date  . ABDOMINAL HYSTERECTOMY  1990   with Right Salpingo--ophorectomy  . ANAL SPHINCTEROTOMY  01-05-2006    dr Rise Patience @[facility]   . Lillard Anes  09-25-2009   dr  Milinda Pointer @[facility]    right great toe  . CARPAL TUNNEL RELEASE Bilateral 1996;  1997  . CESAREAN SECTION  1988  . FOOT SURGERY  12/ 2014   dr Milinda Pointer   left 2nd hammertoe repair,  left 2nd metatarsal matrixectomy, left heel endoscopic plantar fasiotomy  . IR CATHETER TUBE CHANGE  01/01/2019  . IR RADIOLOGIST EVAL & MGMT  11/28/2018  . IR RADIOLOGIST EVAL & MGMT  11/29/2018  . IR RADIOLOGIST EVAL & MGMT  12/06/2018  . IR RADIOLOGIST EVAL & MGMT  12/25/2018  . IR RADIOLOGIST EVAL & MGMT  01/08/2019  . PANNICULECTOMY N/A 10/26/2018   Procedure: PANNICULECTOMY;  Surgeon: Johnathan Hausen, MD;  Location: [facility];  Service: General;  Laterality: N/A;  . TENDON REPAIR  09/2015   left posterior tibial and peroneal tendon repairs  . TOE AMPUTATION  01/2015   right 2nd toe  . TONSILLECTOMY  age 55  . TRANSOBTURATOR SLING  01-08-2004   dr Jeffie Pollock @[facility]     Allergies: Dilaudid [hydromorphone hcl]; Doxycycline; Topiramate; and Trazodone and nefazodone  Medications: Prior to Admission medications   Medication Sig Start Date End Date Taking? Authorizing Provider  acetaminophen (TYLENOL) 500 MG tablet Take 500 mg by mouth every 6 (six) hours as needed for fever.    [provider]  albuterol (PROVENTIL HFA;VENTOLIN HFA) 108 (90 BASE) MCG/ACT inhaler Inhale 1-2 puffs into the lungs every 6 (six) hours as needed for wheezing or shortness of breath.    [provider]  ALPRAZolam Duanne Moron) 1  MG tablet Take 1 mg by mouth 3 (three) times daily as needed for anxiety.     [provider]  amoxicillin-clavulanate (AUGMENTIN) 500-125 MG tablet Take 500 mg by mouth 2 (two) times daily.    [provider]  Continuous Blood Gluc Sensor (FREESTYLE LIBRE 14 DAY SENSOR) MISC USE AS DIRECTED EVERY 2 WEEKS SUBCUTANEOUSLY 05/24/18   [provider]  cyclobenzaprine (FLEXERIL) 10 MG tablet Take 1 tablet (10 mg total) by mouth 3 (three) times daily. Patient taking differently: Take 10 mg by mouth  3 (three) times daily as needed for muscle spasms.  07/10/18   Hyatt, Max T, DPM  DULoxetine (CYMBALTA) 60 MG capsule Take 120 mg by mouth at bedtime.    [provider]  estradiol (ESTRACE) 0.5 MG tablet Take 0.5 mg by mouth every evening.     [provider]  fenofibrate micronized (LOFIBRA) 134 MG capsule Take 134 mg by mouth every evening.     [provider]  folic acid (FOLVITE) 1 MG tablet Take 2 mg by mouth daily.     [provider]  gabapentin (NEURONTIN) 400 MG capsule TAKE 1 CAPSULE BY MOUTH 3 TIMES DAILY. Patient taking differently: Take 400 mg by mouth 3 (three) times daily.  01/31/18   Hyatt, Max T, DPM  hydrochlorothiazide (HYDRODIURIL) 25 MG tablet Take 25 mg by mouth daily.    [provider]  hydroxychloroquine (PLAQUENIL) 200 MG tablet Take 400 mg by mouth at bedtime.     [provider]  insulin glargine (LANTUS SOLOSTAR) 100 UNIT/ML injection Inject 25 Units into the skin 2 (two) times daily. Per [place]    [provider]  insulin lispro (HUMALOG KWIKPEN) 100 UNIT/ML injection Inject 6-15 Units into the skin 3 (three) times daily with meals. 6-15 units sliding scale AS NEEDED    [provider]  levocetirizine (XYZAL) 5 MG tablet Take 5 mg by mouth every evening.    [provider]  levothyroxine (SYNTHROID, LEVOTHROID) 50 MCG tablet Take 50 mcg by mouth daily before breakfast.     [provider]  losartan (COZAAR) 100 MG tablet Take 100 mg by mouth daily.    [provider]  metFORMIN (GLUCOPHAGE-XR) 500 MG 24 hr tablet Take 1,000 mg by mouth at bedtime.     [provider]  methotrexate 25 MG/ML SOLN Inject 20 mg into the muscle every Thursday. Inject 0.8 mL once weekly at bedtime. Every Thursday    [provider]  Methotrexate, Anti-Rheumatic, (METHOTREXATE, PF, [place]) Inject 0.8 mLs into the skin once a week. thursday's    [provider]  montelukast  (SINGULAIR) 10 MG tablet Take 10 mg by mouth at bedtime.    [provider]  omeprazole (PRILOSEC) 40 MG capsule Take 40 mg by mouth every evening.     [provider]  traMADol (ULTRAM) 50 MG tablet Take 1 tablet (50 mg total) by mouth every 8 (eight) hours as needed. Patient taking differently: Take 50 mg by mouth every 6 (six) hours as needed (pain).  12/13/16   Hyatt, Max T, DPM  UNIFINE PENTIPS 31G X 5 MM MISC USE AS DIRECTED 5 TIMES PER DAY 06/19/18   [provider]  valACYclovir (VALTREX) 1000 MG tablet Take 1,000 mg by mouth 2 (two) times daily as needed (fever blisters).  05/01/18   [provider]  zolpidem (AMBIEN CR) 12.5 MG CR tablet Take 12.5 mg by mouth at bedtime.  [provider]     Family History  Problem Relation Age of Onset  . Diabetes Mother   . Migraines Mother   . Diabetes Sister     Social History   Socioeconomic History  . Marital status: Divorced    Spouse name: Not on file  . Number of children: Not on file  . Years of education: Not on file  . Highest education level: Not on file  Occupational History  . Not on file  Social Needs  . Financial resource strain: Not on file  . Food insecurity:    Worry: Not on file    Inability: Not on file  . Transportation needs:    Medical: Not on file    Non-medical: Not on file  Tobacco Use  . Smoking status: Never Smoker  . Smokeless tobacco: Never Used  Substance and Sexual Activity  . Alcohol use: No  . Drug use: Never  . Sexual activity: Not on file  Lifestyle  . Physical activity:    Days per week: Not on file    Minutes per session: Not on file  . Stress: Not on file  Relationships  . Social connections:    Talks on phone: Not on file    Gets together: Not on file    Attends religious service: Not on file    Active member of club or organization: Not on file    Attends meetings of clubs or organizations: Not on file    Relationship status: Not on  file  Other Topics Concern  . Not on file  Social History Narrative  . Not on file     Vital Signs: Temp 97.9 F (36.6 C) (Oral)   Resp 16   Ht 5\' 9"  (1.753 m)   Wt 203 lb (92.1 kg)   SpO2 100%   BMI 29.98 kg/m   Physical Exam  NAD, not ill-appearing Abdomen:  Soft, non distended, non-tender to palpation.  Surgical incision intact and appears to be healing well.  Drain remains intact with hazy, serosanguinous output. Site is without sign of infection; no erythema or warmth.   Imaging: No results found.  Labs:  CBC: Recent Labs    10/27/18 0315 11/07/18 1948 11/13/18 1208 12/10/18 1304  WBC 10.4 14.6* 6.4 5.5  HGB 12.2 11.6* 12.3 12.5  HCT 37.1 35.4* 38.7 39.9  PLT 228 219 385 294    COAGS: Recent Labs    11/13/18 1208 12/10/18 1304  INR 0.97 0.92    BMP: Recent Labs    10/22/18 1620 10/26/18 1550 10/27/18 0315 11/07/18 1948 11/13/18 1208  NA 138  --  138 136 140  K 4.1  --  4.0 3.4* 3.9  CL 99  --  99 98 102  CO2 28  --  26 25 28   GLUCOSE 122*  --  191* 137* 137*  BUN 16  --  19 27* 10  CALCIUM 9.3  --  9.0 9.0 9.3  CREATININE 0.86 1.26* 1.05* 1.23* 0.95  GFRNONAA >60 48* >60 50* >60  GFRAA >60 56* >60 58* >60    LIVER FUNCTION TESTS: No results for input(s): BILITOT, AST, ALT, ALKPHOS, PROT, ALBUMIN in the last 8760 hours.  Assessment: Post-op seroma s/p panniculectomy requiring drain placement 11/13/18 with replacement/upsize 12/10/18 and exchange 01/01/19.  Patient presents for follow-up of her drain. She has had only a small amount of output over the past few days which is a significant decline.   Based on her  CT results 2/25 and now decreased output on assessment today without return of fever, chills, abdominal pain or fullness the drain can be removed.  Plan discussed with Dr. Laurence Ferrari and patient who is in agreement. Also discussed potential risk of reoccurrence or infection.  Drain removed in its entirety without complication.  No  follow-up at this time unless symptoms recur.  Patient is aware.   Signed: Docia Barrier, PA 01/29/2019, 2:49 PM   Please refer to Dr. Laurence Ferrari attestation of this note for management and plan.

## 2019-01-31 ENCOUNTER — Other Ambulatory Visit: Payer: Self-pay

## 2019-01-31 ENCOUNTER — Encounter: Payer: Self-pay | Admitting: Podiatry

## 2019-01-31 ENCOUNTER — Ambulatory Visit: Payer: 59 | Admitting: Podiatry

## 2019-01-31 DIAGNOSIS — E1142 Type 2 diabetes mellitus with diabetic polyneuropathy: Secondary | ICD-10-CM | POA: Diagnosis not present

## 2019-01-31 MED ORDER — CYCLOBENZAPRINE HCL 10 MG PO TABS: 10.0000 mg | ORAL_TABLET | Freq: Three times a day (TID) | ORAL | 3 refills | Status: DC

## 2019-01-31 NOTE — Progress Notes (Signed)
She presents today for follow-up of her neuropathy and stating that everything is doing better but she has spasms in the left ankle.  Objective: Vital signs are stable she alert and oriented x3 flexible hammertoe deformities #3 #4 the left foot with some bruising to the distal tip of the third toe.  She has mild tenderness on palpation posterior tibial tendon.  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  Plan: At this point I will continue her Flexeril regularly.  I am also going to get her in with [facility] for new set of orthotics or insoles.  I would also like to consider flexor tenotomy's.

## 2019-02-01 MED FILL — FREESTYLE LIBRE 14 DAY SENS: 84 days supply | Qty: 6 | Fill #3

## 2019-02-18 DIAGNOSIS — F324 Major depressive disorder, single episode, in partial remission: Secondary | ICD-10-CM | POA: Diagnosis not present

## 2019-02-18 DIAGNOSIS — K219 Gastro-esophageal reflux disease without esophagitis: Secondary | ICD-10-CM | POA: Diagnosis not present

## 2019-02-18 DIAGNOSIS — M069 Rheumatoid arthritis, unspecified: Secondary | ICD-10-CM | POA: Diagnosis not present

## 2019-02-18 DIAGNOSIS — E1142 Type 2 diabetes mellitus with diabetic polyneuropathy: Secondary | ICD-10-CM | POA: Diagnosis not present

## 2019-02-18 DIAGNOSIS — E039 Hypothyroidism, unspecified: Secondary | ICD-10-CM | POA: Diagnosis not present

## 2019-02-18 DIAGNOSIS — Z7984 Long term (current) use of oral hypoglycemic drugs: Secondary | ICD-10-CM | POA: Diagnosis not present

## 2019-02-18 DIAGNOSIS — I10 Essential (primary) hypertension: Secondary | ICD-10-CM | POA: Diagnosis not present

## 2019-02-18 DIAGNOSIS — E782 Mixed hyperlipidemia: Secondary | ICD-10-CM | POA: Diagnosis not present

## 2019-02-18 DIAGNOSIS — G4700 Insomnia, unspecified: Secondary | ICD-10-CM | POA: Diagnosis not present

## 2019-02-20 MED FILL — ALBUTEROL SULFATE HFA 108 (: 108 (90 BAS | 25 days supply | Qty: 9 | Fill #0

## 2019-02-21 ENCOUNTER — Other Ambulatory Visit: Payer: 59 | Admitting: Orthotics

## 2019-02-28 ENCOUNTER — Other Ambulatory Visit: Payer: 59 | Admitting: Orthotics

## 2019-03-08 MED FILL — CYCLOBENZAPRINE HCL 10 MG T: 10 | 30 days supply | Qty: 90 | Fill #0

## 2019-03-18 ENCOUNTER — Ambulatory Visit (INDEPENDENT_AMBULATORY_CARE_PROVIDER_SITE_OTHER): Payer: 59 | Admitting: Orthotics

## 2019-03-18 ENCOUNTER — Other Ambulatory Visit: Payer: Self-pay

## 2019-03-18 ENCOUNTER — Encounter: Payer: Self-pay | Admitting: Podiatry

## 2019-03-18 DIAGNOSIS — M7750 Other enthesopathy of unspecified foot: Secondary | ICD-10-CM

## 2019-03-18 DIAGNOSIS — E1142 Type 2 diabetes mellitus with diabetic polyneuropathy: Secondary | ICD-10-CM

## 2019-03-18 DIAGNOSIS — M7752 Other enthesopathy of left foot: Secondary | ICD-10-CM

## 2019-03-18 DIAGNOSIS — M7672 Peroneal tendinitis, left leg: Secondary | ICD-10-CM

## 2019-03-18 DIAGNOSIS — M76822 Posterior tibial tendinitis, left leg: Secondary | ICD-10-CM

## 2019-03-18 DIAGNOSIS — M7751 Other enthesopathy of right foot: Secondary | ICD-10-CM | POA: Diagnosis not present

## 2019-03-18 DIAGNOSIS — G5762 Lesion of plantar nerve, left lower limb: Secondary | ICD-10-CM

## 2019-03-18 DIAGNOSIS — G5752 Tarsal tunnel syndrome, left lower limb: Secondary | ICD-10-CM

## 2019-03-18 NOTE — Progress Notes (Signed)
Scanned for repeat f/o

## 2019-03-22 MED FILL — ALBUTEROL SULFATE HFA 108 (: 108 (90 BAS | 25 days supply | Qty: 9 | Fill #1 | Status: TO

## 2019-04-03 ENCOUNTER — Other Ambulatory Visit: Payer: Self-pay

## 2019-04-03 ENCOUNTER — Encounter: Payer: 59 | Attending: Family Medicine | Admitting: *Deleted

## 2019-04-03 DIAGNOSIS — E1142 Type 2 diabetes mellitus with diabetic polyneuropathy: Secondary | ICD-10-CM | POA: Insufficient documentation

## 2019-04-03 DIAGNOSIS — Z794 Long term (current) use of insulin: Secondary | ICD-10-CM | POA: Insufficient documentation

## 2019-04-03 NOTE — Patient Instructions (Signed)
We discussed the following  Choose different restaurants for fresh food ideas ([employer], [employer]) Boil foods that taste salty before cooking it Increase activity by going walking at lunch or even after work and increase time or distance as able Mix 1/2 package flavored oatmeal with 1 package regular to reduce sugar content

## 2019-04-03 NOTE — Progress Notes (Signed)
Diabetes Self-Management Education  Visit Type:  Follow-up  Appt. Start Time: 0800 Appt. End Time: 0830  04/03/2019  [name], identified by name and date of birth, is a 53 y.o. female with a diagnosis of Diabetes:  Patient is an employee here and I see her for follow up for her Diabetes regularly. She is struggling with the multiple impacts the COVID-19 pandemic has on her life; financially, physically and emotionally.  She is still not cooking meals at home since the death of her boyfriend over 20 months ago. She was going to gym 3-5 days a week for 2 hours for over a year, but the gyms are closed now. She would like to review new food options that would be appealing to her and ways to become more active.  ASSESSMENT  There were no vitals taken for this visit. There is no height or weight on file to calculate BMI.   Diabetes Self-Management Education - 04/03/19 0903      Psychosocial Assessment   Patient Belief/Attitude about Diabetes  Motivated to manage diabetes   struggling with COVID-19 impact on many parts of her life, especially the finanacial   Self-care barriers  None    Self-management support  Friends;CDE visits    Patient Concerns  Nutrition/Meal planning;Healthy Lifestyle    Special Needs  None    Learning Readiness  Change in progress      Subsequent Visit   Since your last visit, are you checking your blood glucose at least once a day?  Yes      Learning Objective:  Patient will have a greater understanding of diabetes self-management. Patient education plan is to attend individual and/or group sessions per assessed needs and concerns.  Plan:   Patient Instructions  We discussed the following  Choose different restaurants for fresh food ideas ([employer], [employer]) Boil foods that taste salty before cooking it Increase activity by going walking at lunch or even after work and increase time or distance as able Mix 1/2 package flavored oatmeal with 1 package  regular to reduce sugar content  Expected Outcomes:     Education material provided: Meal plan card  If problems or questions, patient to contact team via:  Phone  Future DSME appointment: -  4-6 weeks

## 2019-04-07 ENCOUNTER — Encounter: Payer: Self-pay | Admitting: Podiatry

## 2019-04-09 ENCOUNTER — Ambulatory Visit: Payer: Self-pay | Admitting: Orthotics

## 2019-04-09 ENCOUNTER — Other Ambulatory Visit: Payer: Self-pay

## 2019-04-09 DIAGNOSIS — G5762 Lesion of plantar nerve, left lower limb: Secondary | ICD-10-CM

## 2019-04-09 DIAGNOSIS — M722 Plantar fascial fibromatosis: Secondary | ICD-10-CM

## 2019-04-09 DIAGNOSIS — E1142 Type 2 diabetes mellitus with diabetic polyneuropathy: Secondary | ICD-10-CM

## 2019-04-09 DIAGNOSIS — M7672 Peroneal tendinitis, left leg: Secondary | ICD-10-CM

## 2019-04-09 DIAGNOSIS — M79676 Pain in unspecified toe(s): Secondary | ICD-10-CM

## 2019-04-09 DIAGNOSIS — M7750 Other enthesopathy of unspecified foot: Secondary | ICD-10-CM

## 2019-04-09 NOTE — Progress Notes (Signed)
Patient came in today to pick up custom made foot orthotics.  The goals were accomplished and the patient reported no dissatisfaction with said orthotics.  Patient was advised of breakin period and how to report any issues. 

## 2019-04-16 MED FILL — UNIFINE PENTIPS 31GX3/16: 31G X 5 MM | 80 days supply | Qty: 400 | Fill #3

## 2019-04-16 MED FILL — LEVOCETIRIZINE 5 MG TABLET: 5 | 90 days supply | Qty: 90 | Fill #2

## 2019-04-16 MED FILL — CYCLOBENZAPRINE 10 MG TAB: 10 | 90 days supply | Qty: 270 | Fill #0

## 2019-04-16 MED FILL — MONTELUKAST SOD 10 MG TAB: 10 | 90 days supply | Qty: 90 | Fill #2

## 2019-04-16 MED FILL — UNIFINE PENTIPS 31GX3/16": 31G X 5 MM | 80 days supply | Qty: 400 | Fill #3

## 2019-04-16 MED FILL — FREESTYLE LIBRE 14 DAY SENS: 84 days supply | Qty: 6 | Fill #4

## 2019-04-16 MED FILL — HYDROXYCHLOROQUINE SULFATE: 200 | 30 days supply | Qty: 60 | Fill #3

## 2019-04-16 MED FILL — ALBUTEROL SULFATE HFA 108 (: 108 (90 BAS | 25 days supply | Qty: 9 | Fill #0

## 2019-04-16 MED FILL — HUMALOG 100 UNITS/ML KWIKPE: 100 | 50 days supply | Qty: 45 | Fill #1

## 2019-04-16 MED FILL — LANTUS SOLOSTAR 100 UNITS/M: 100 | 28 days supply | Qty: 30 | Fill #1

## 2019-04-16 MED FILL — LEVOTHYROXINE 50 MCG TABLET: 50 | 90 days supply | Qty: 90 | Fill #3

## 2019-04-17 MED FILL — OMEPRAZOLE 40 MG CPDR: 40 | 90 days supply | Qty: 180 | Fill #1

## 2019-04-17 MED FILL — DULoxetine HCL 60 MG CPEP: 60 | 90 days supply | Qty: 180 | Fill #1

## 2019-04-17 MED FILL — METHOTREXATE 25 MG/ML VIAL: 50 | 52 days supply | Qty: 6 | Fill #0

## 2019-04-17 MED FILL — FOLIC ACID 1 MG TABS: 1 | 90 days supply | Qty: 180 | Fill #1

## 2019-04-18 DIAGNOSIS — M0589 Other rheumatoid arthritis with rheumatoid factor of multiple sites: Secondary | ICD-10-CM | POA: Diagnosis not present

## 2019-04-18 DIAGNOSIS — L659 Nonscarring hair loss, unspecified: Secondary | ICD-10-CM | POA: Diagnosis not present

## 2019-04-18 DIAGNOSIS — M5136 Other intervertebral disc degeneration, lumbar region: Secondary | ICD-10-CM | POA: Diagnosis not present

## 2019-04-18 DIAGNOSIS — M150 Primary generalized (osteo)arthritis: Secondary | ICD-10-CM | POA: Diagnosis not present

## 2019-04-18 DIAGNOSIS — M25512 Pain in left shoulder: Secondary | ICD-10-CM | POA: Diagnosis not present

## 2019-04-18 DIAGNOSIS — Z79899 Other long term (current) drug therapy: Secondary | ICD-10-CM | POA: Diagnosis not present

## 2019-04-18 DIAGNOSIS — M2550 Pain in unspecified joint: Secondary | ICD-10-CM | POA: Diagnosis not present

## 2019-04-21 MED FILL — ALPRAZolam 1 MG TABS: 1 | 90 days supply | Qty: 270 | Fill #0

## 2019-04-22 MED FILL — GABAPENTIN 400 MG CAPSULE: 400 | 90 days supply | Qty: 270 | Fill #2

## 2019-04-22 MED FILL — ULTICARE ALCOHOL SWABS 70 %: 70 | 90 days supply | Qty: 400 | Fill #1

## 2019-05-03 DIAGNOSIS — M545 Low back pain: Secondary | ICD-10-CM | POA: Diagnosis not present

## 2019-05-03 MED FILL — DICLOFENAC SODIUM 75 MG TAB: 75 | 15 days supply | Qty: 30 | Fill #0

## 2019-05-07 ENCOUNTER — Ambulatory Visit: Payer: 59 | Admitting: Podiatry

## 2019-05-15 ENCOUNTER — Encounter: Payer: 59 | Attending: Family Medicine | Admitting: Registered"

## 2019-05-15 ENCOUNTER — Other Ambulatory Visit: Payer: Self-pay

## 2019-05-15 DIAGNOSIS — E1142 Type 2 diabetes mellitus with diabetic polyneuropathy: Secondary | ICD-10-CM | POA: Insufficient documentation

## 2019-05-15 DIAGNOSIS — Z794 Long term (current) use of insulin: Secondary | ICD-10-CM | POA: Insufficient documentation

## 2019-05-15 MED FILL — LANTUS SOLOSTAR 100 UNITS/M: 100 | 28 days supply | Qty: 36 | Fill #0

## 2019-05-15 NOTE — Progress Notes (Signed)
[place] visit 3 of 3  Diabetes Self-Management Education  Visit Type: Follow-up  Appt. Start Time: 1150 Appt. End Time: 0355  05/15/2019  [name], identified by name and date of birth, is a 53 y.o. female with a diagnosis of Diabetes:  .   ASSESSMENT  Height 5\' 9"  (1.753 m), weight 200 lb 8 oz (90.9 kg). Body mass index is 29.61 kg/m.  Pt continues to experience stress in life and increased recently due to recent loss of pet.   Pt states she is not hungry, but eats to keep blood sugar from dropping. Pt states her Elenor Legato shows blood sugar dropping into 50s during the night. Pt states her appetite hasn't returned since surgery and thinks the 6 lb weight gain might be due to reduced exercise. Pt states she misses being able to go to the gym and work out her stress.  Pt states this week she started walking labs around the office at lunch time. She reports she uses resistance bands daily 3 sects of 15 of 4 different exercises.  Pt states when BG is 70-80 in evening she will eat carbs for dinner such as wheat pasta and vegetables to keep blood sugar from dropping during.   The other day pt states all she ate all day was an oatmeal raisin cookie d/t loss of appetite.  Pt states Doterra probioticshelps her prevent constipation.  Provided Glucerna sample: [id number] Exp: 01/13/2120  Diabetes Self-Management Education - 05/15/19 1225      Visit Information   Visit Type  Follow-up      Complications   How often do you check your blood sugar?  > 4 times/day    Fasting Blood glucose range (mg/dL)  70-129    Number of hypoglycemic episodes per month  1    Can you tell when your blood sugar is low?  Yes      Dietary Intake   Dinner  wheat pasta and vegetables    Snack (evening)  bite size candy bar if BG is dropping    Beverage(s)  water, diet cranberry      Exercise   Exercise Type  Light (walking / raking leaves)    How many days per week to you exercise?  7    How many minutes per day do you exercise?  20    Total minutes per week of exercise  140      Patient Education   Previous Diabetes Education  Yes (please comment)    Nutrition management   Other (comment)   balance carb & protein   Physical activity and exercise   Identified with patient nutritional and/or medication changes necessary with exercise.    Monitoring  --   eat at night to prevent nighttime hypoglycemia   Acute complications  Other (comment)      Individualized Goals (developed by patient)   Nutrition  General guidelines for healthy choices and portions discussed;Other (comment)   use nutritional supplements when not hungry   Physical Activity  Exercise 5-7 days per week      Outcomes   Expected Outcomes  Demonstrated interest in learning. Expect positive outcomes    Future DMSE  PRN    Program Status  Completed      Subsequent Visit   Since your last visit have you continued or begun to take your medications as prescribed?  Yes    Since your last visit have you experienced any weight changes?  Gain    Since  your last visit, are you checking your blood glucose at least once a day?  Yes       Individualized Plan for Diabetes Self-Management Training:   Learning Objective:  Patient will have a greater understanding of diabetes self-management. Patient education plan is to attend individual and/or group sessions per assessed needs and concerns.  Patient Instructions  When not hungry consider having a Glucerna or equivalent nutritional supplement, avoid skipping too many meals.  Consider eating something with carbs and protein before bed to prevent low blood sugar.  You can freeze your grapes to help reduce food waste. When eating grapes, having nuts or cheese with them is a good idea  Continue with increased walking as tolerated. Consider stretching before walking to help prevent soreness.   Expected Outcomes:  Demonstrated interest in learning. Expect positive  outcomes  Education material provided: none  If problems or questions, patient to contact team via:  Phone  Future DSME appointment: PRN

## 2019-05-15 NOTE — Patient Instructions (Addendum)
When not hungry consider having a Glucerna or equivalent nutritional supplement, avoid skipping too many meals.  Consider eating something with carbs and protein before bed to prevent low blood sugar.  You can freeze your grapes to help reduce food waste. When eating grapes, having nuts or cheese with them is a good idea  Continue with increased walking as tolerated. Consider stretching before walking to help prevent soreness. Continue using resistance bands as tolerated

## 2019-05-21 MED FILL — LIDOCAINE PATCH 5%: 5 | 60 days supply | Qty: 60 | Fill #0

## 2019-05-27 DIAGNOSIS — E7800 Pure hypercholesterolemia, unspecified: Secondary | ICD-10-CM | POA: Diagnosis not present

## 2019-05-27 DIAGNOSIS — E1165 Type 2 diabetes mellitus with hyperglycemia: Secondary | ICD-10-CM | POA: Diagnosis not present

## 2019-05-27 DIAGNOSIS — E039 Hypothyroidism, unspecified: Secondary | ICD-10-CM | POA: Diagnosis not present

## 2019-05-29 DIAGNOSIS — E7800 Pure hypercholesterolemia, unspecified: Secondary | ICD-10-CM | POA: Diagnosis not present

## 2019-05-29 DIAGNOSIS — E039 Hypothyroidism, unspecified: Secondary | ICD-10-CM | POA: Diagnosis not present

## 2019-05-29 DIAGNOSIS — E049 Nontoxic goiter, unspecified: Secondary | ICD-10-CM | POA: Diagnosis not present

## 2019-05-29 DIAGNOSIS — I10 Essential (primary) hypertension: Secondary | ICD-10-CM | POA: Diagnosis not present

## 2019-05-29 DIAGNOSIS — E1165 Type 2 diabetes mellitus with hyperglycemia: Secondary | ICD-10-CM | POA: Diagnosis not present

## 2019-05-29 DIAGNOSIS — G609 Hereditary and idiopathic neuropathy, unspecified: Secondary | ICD-10-CM | POA: Diagnosis not present

## 2019-06-04 ENCOUNTER — Ambulatory Visit: Payer: 59 | Admitting: Podiatry

## 2019-06-11 ENCOUNTER — Telehealth: Payer: Self-pay | Admitting: *Deleted

## 2019-06-11 NOTE — Telephone Encounter (Signed)
I called and informed pt she could stop by to pick up the requested silicone offloading crescent.

## 2019-06-11 NOTE — Telephone Encounter (Signed)
Pt called requested a silicone lift with a hole for the toe to off load the toes until she can get in to be seen 06/24/2019, since her appt has been changed multiple time.

## 2019-06-12 ENCOUNTER — Ambulatory Visit: Payer: 59 | Admitting: Podiatry

## 2019-06-25 ENCOUNTER — Other Ambulatory Visit: Payer: Self-pay

## 2019-06-25 ENCOUNTER — Encounter: Payer: Self-pay | Admitting: Podiatry

## 2019-06-25 ENCOUNTER — Ambulatory Visit: Payer: 59 | Admitting: Podiatry

## 2019-06-25 ENCOUNTER — Encounter

## 2019-06-25 VITALS — Temp 96.7°F

## 2019-06-25 DIAGNOSIS — M778 Other enthesopathies, not elsewhere classified: Secondary | ICD-10-CM

## 2019-06-25 DIAGNOSIS — M2042 Other hammer toe(s) (acquired), left foot: Secondary | ICD-10-CM

## 2019-06-25 DIAGNOSIS — M779 Enthesopathy, unspecified: Secondary | ICD-10-CM | POA: Diagnosis not present

## 2019-06-25 DIAGNOSIS — E1142 Type 2 diabetes mellitus with diabetic polyneuropathy: Secondary | ICD-10-CM

## 2019-06-26 ENCOUNTER — Encounter: Payer: Self-pay | Admitting: Podiatry

## 2019-06-26 NOTE — Progress Notes (Signed)
She presents today for follow-up of the third toe left foot and sub-first metatarsal phalangeal joint right foot.  States that it feels like him walking on the end of the toe feel like I do not have enough padding under my big toe.  Objective: Vital signs are stable she is alert and oriented x3 she has pain on palpation of the third metatarsal phalangeal joint of the left foot.  She has pain on end range of motion.  Pulses are palpable.  Neurologic sensorium is diminished.  Assessment: Diabetic peripheral neuropathy with angiopathy.  Hammertoe deformity flexible in nature with capsulitis third left.  Fat pad atrophy forefoot bilateral.  Plan: Discussed etiology pathology conservative versus surgical therapies.  Injected her third metatarsal phalangeal joint today after sterile Betadine skin prep with 2 mg of dexamethasone and local anesthetic she tolerated procedure well without complications.  I will follow-up with her in the near future for tenotomies of the toes.  We did discuss appropriate shoes and padding for the forefoot.

## 2019-06-27 MED FILL — ZOLPIDEM TART ER 12.5 MG TA: 12.5 | 90 days supply | Qty: 90 | Fill #0

## 2019-06-27 MED FILL — traMADol HCL 50 MG TABS: 50 | 90 days supply | Qty: 180 | Fill #0

## 2019-06-28 DIAGNOSIS — Z79899 Other long term (current) drug therapy: Secondary | ICD-10-CM | POA: Diagnosis not present

## 2019-06-28 DIAGNOSIS — H40033 Anatomical narrow angle, bilateral: Secondary | ICD-10-CM | POA: Diagnosis not present

## 2019-06-28 DIAGNOSIS — H35033 Hypertensive retinopathy, bilateral: Secondary | ICD-10-CM | POA: Diagnosis not present

## 2019-06-28 DIAGNOSIS — H524 Presbyopia: Secondary | ICD-10-CM | POA: Diagnosis not present

## 2019-06-28 DIAGNOSIS — H40013 Open angle with borderline findings, low risk, bilateral: Secondary | ICD-10-CM | POA: Diagnosis not present

## 2019-07-04 MED FILL — FREESTYLE LIBRE 14 DAY SENS: 84 days supply | Qty: 6 | Fill #5

## 2019-07-08 DIAGNOSIS — M5411 Radiculopathy, occipito-atlanto-axial region: Secondary | ICD-10-CM | POA: Diagnosis not present

## 2019-07-08 DIAGNOSIS — M9901 Segmental and somatic dysfunction of cervical region: Secondary | ICD-10-CM | POA: Diagnosis not present

## 2019-07-15 DIAGNOSIS — M5411 Radiculopathy, occipito-atlanto-axial region: Secondary | ICD-10-CM | POA: Diagnosis not present

## 2019-07-15 DIAGNOSIS — M9901 Segmental and somatic dysfunction of cervical region: Secondary | ICD-10-CM | POA: Diagnosis not present

## 2019-07-16 DIAGNOSIS — M9901 Segmental and somatic dysfunction of cervical region: Secondary | ICD-10-CM | POA: Diagnosis not present

## 2019-07-16 DIAGNOSIS — M5411 Radiculopathy, occipito-atlanto-axial region: Secondary | ICD-10-CM | POA: Diagnosis not present

## 2019-07-17 DIAGNOSIS — M9901 Segmental and somatic dysfunction of cervical region: Secondary | ICD-10-CM | POA: Diagnosis not present

## 2019-07-17 DIAGNOSIS — M5411 Radiculopathy, occipito-atlanto-axial region: Secondary | ICD-10-CM | POA: Diagnosis not present

## 2019-07-17 MED FILL — metFORMIN HCL ER 500 MG TB2: 500 | 90 days supply | Qty: 360 | Fill #0

## 2019-07-23 DIAGNOSIS — M5411 Radiculopathy, occipito-atlanto-axial region: Secondary | ICD-10-CM | POA: Diagnosis not present

## 2019-07-23 DIAGNOSIS — M9901 Segmental and somatic dysfunction of cervical region: Secondary | ICD-10-CM | POA: Diagnosis not present

## 2019-07-24 MED FILL — LANTUS SOLOSTAR 100 UNITS/M: 100 | 28 days supply | Qty: 36 | Fill #1

## 2019-07-24 MED FILL — CYCLOBENZAPRINE 5 MG TABLET: 5 | 7 days supply | Qty: 21 | Fill #0

## 2019-07-24 MED FILL — ALPRAZolam 1 MG TABS: 1 | 90 days supply | Qty: 270 | Fill #1

## 2019-07-24 MED FILL — LEVOCETIRIZINE 5 MG TABLET: 5 | 90 days supply | Qty: 90 | Fill #3

## 2019-07-24 MED FILL — MONTELUKAST SOD 10 MG TAB: 10 | 90 days supply | Qty: 90 | Fill #3

## 2019-07-24 MED FILL — ALBUTEROL SULFATE HFA 108 (: 108 (90 BAS | 25 days supply | Qty: 9 | Fill #1

## 2019-07-24 MED FILL — HUMALOG 100 UNITS/ML KWIKPE: 100 | 50 days supply | Qty: 45 | Fill #2

## 2019-07-25 DIAGNOSIS — M5411 Radiculopathy, occipito-atlanto-axial region: Secondary | ICD-10-CM | POA: Diagnosis not present

## 2019-07-25 DIAGNOSIS — M9901 Segmental and somatic dysfunction of cervical region: Secondary | ICD-10-CM | POA: Diagnosis not present

## 2019-07-26 DIAGNOSIS — M2550 Pain in unspecified joint: Secondary | ICD-10-CM | POA: Diagnosis not present

## 2019-07-26 DIAGNOSIS — M0589 Other rheumatoid arthritis with rheumatoid factor of multiple sites: Secondary | ICD-10-CM | POA: Diagnosis not present

## 2019-07-26 DIAGNOSIS — Z79899 Other long term (current) drug therapy: Secondary | ICD-10-CM | POA: Diagnosis not present

## 2019-07-26 DIAGNOSIS — L659 Nonscarring hair loss, unspecified: Secondary | ICD-10-CM | POA: Diagnosis not present

## 2019-07-26 DIAGNOSIS — M5136 Other intervertebral disc degeneration, lumbar region: Secondary | ICD-10-CM | POA: Diagnosis not present

## 2019-07-26 DIAGNOSIS — M150 Primary generalized (osteo)arthritis: Secondary | ICD-10-CM | POA: Diagnosis not present

## 2019-07-26 DIAGNOSIS — M25512 Pain in left shoulder: Secondary | ICD-10-CM | POA: Diagnosis not present

## 2019-07-26 MED FILL — FOLIC ACID 1 MG TABS: 1 | 90 days supply | Qty: 180 | Fill #0

## 2019-07-26 MED FILL — METHOTREXATE 25 MG/ML VIAL: 50 | 84 days supply | Qty: 10 | Fill #0

## 2019-07-26 MED FILL — HYDROXYCHLOROQUINE SULFATE: 200 | 90 days supply | Qty: 180 | Fill #0

## 2019-07-29 DIAGNOSIS — M5411 Radiculopathy, occipito-atlanto-axial region: Secondary | ICD-10-CM | POA: Diagnosis not present

## 2019-07-29 DIAGNOSIS — M9901 Segmental and somatic dysfunction of cervical region: Secondary | ICD-10-CM | POA: Diagnosis not present

## 2019-07-29 MED FILL — CYCLOBENZAPRINE 10 MG TAB: 10 | 30 days supply | Qty: 90 | Fill #1

## 2019-07-31 DIAGNOSIS — I10 Essential (primary) hypertension: Secondary | ICD-10-CM | POA: Diagnosis not present

## 2019-07-31 DIAGNOSIS — M9901 Segmental and somatic dysfunction of cervical region: Secondary | ICD-10-CM | POA: Diagnosis not present

## 2019-07-31 DIAGNOSIS — M5411 Radiculopathy, occipito-atlanto-axial region: Secondary | ICD-10-CM | POA: Diagnosis not present

## 2019-07-31 DIAGNOSIS — R300 Dysuria: Secondary | ICD-10-CM | POA: Diagnosis not present

## 2019-07-31 MED FILL — FLUCONAZOLE 150 MG TABLET: 150 | 1 days supply | Qty: 1 | Fill #1

## 2019-07-31 MED FILL — CIPROFLOXACIN HCL 500 MG TA: 500 | 7 days supply | Qty: 14 | Fill #0

## 2019-07-31 MED FILL — VALSARTAN 320 MG TAB: 320 | 90 days supply | Qty: 90 | Fill #0

## 2019-08-01 MED FILL — FLUCONAZOLE 150 MG TABLET: 150 | 2 days supply | Qty: 2 | Fill #0

## 2019-08-06 DIAGNOSIS — M5411 Radiculopathy, occipito-atlanto-axial region: Secondary | ICD-10-CM | POA: Diagnosis not present

## 2019-08-06 DIAGNOSIS — M9901 Segmental and somatic dysfunction of cervical region: Secondary | ICD-10-CM | POA: Diagnosis not present

## 2019-08-08 DIAGNOSIS — M5411 Radiculopathy, occipito-atlanto-axial region: Secondary | ICD-10-CM | POA: Diagnosis not present

## 2019-08-08 DIAGNOSIS — M9901 Segmental and somatic dysfunction of cervical region: Secondary | ICD-10-CM | POA: Diagnosis not present

## 2019-08-12 DIAGNOSIS — E7800 Pure hypercholesterolemia, unspecified: Secondary | ICD-10-CM | POA: Diagnosis not present

## 2019-08-13 ENCOUNTER — Other Ambulatory Visit: Payer: Self-pay

## 2019-08-13 ENCOUNTER — Encounter: Payer: Self-pay | Admitting: Podiatry

## 2019-08-13 ENCOUNTER — Ambulatory Visit: Payer: 59 | Admitting: Podiatry

## 2019-08-13 DIAGNOSIS — G5792 Unspecified mononeuropathy of left lower limb: Secondary | ICD-10-CM

## 2019-08-14 ENCOUNTER — Encounter: Payer: Self-pay | Admitting: Podiatry

## 2019-08-14 NOTE — Progress Notes (Signed)
She presents today and have a tenotomy done to the third toe of the left foot.  She states that however she has been utilizing the buttress pads and that seems to be working just fine to keep the pressure off in that toe and she wants to forego the tenotomy at this point.  She is however having radiating pain through the second intermetatarsal space.  She states that it feels like an electrical shock and it seems to be on a regular cycle as if it were a heartbeat.  Objective: Vital signs are stable she is alert and oriented x3 pulses are palpable.  Neurologic sensorium is diminished per Semmes Weinstein monofilament to the forefoot bilaterally hammertoes appear to be in good condition no distal clavi are noted.  No reproducible pain in the area of question of the radiating pain.  Assessment: Most likely neuritis interdigital space intermetatarsal space second left.  Plan: At this point we injected 2 cc of a 4% dehydrated alcohol with local anesthetic.  I will follow-up with her in 3 weeks.

## 2019-08-15 DIAGNOSIS — Z6828 Body mass index (BMI) 28.0-28.9, adult: Secondary | ICD-10-CM | POA: Diagnosis not present

## 2019-08-15 DIAGNOSIS — Z1231 Encounter for screening mammogram for malignant neoplasm of breast: Secondary | ICD-10-CM | POA: Diagnosis not present

## 2019-08-15 DIAGNOSIS — Z13820 Encounter for screening for osteoporosis: Secondary | ICD-10-CM | POA: Diagnosis not present

## 2019-08-15 DIAGNOSIS — Z01419 Encounter for gynecological examination (general) (routine) without abnormal findings: Secondary | ICD-10-CM | POA: Diagnosis not present

## 2019-08-19 DIAGNOSIS — M5411 Radiculopathy, occipito-atlanto-axial region: Secondary | ICD-10-CM | POA: Diagnosis not present

## 2019-08-19 DIAGNOSIS — M9901 Segmental and somatic dysfunction of cervical region: Secondary | ICD-10-CM | POA: Diagnosis not present

## 2019-08-26 DIAGNOSIS — M9901 Segmental and somatic dysfunction of cervical region: Secondary | ICD-10-CM | POA: Diagnosis not present

## 2019-08-26 DIAGNOSIS — M5411 Radiculopathy, occipito-atlanto-axial region: Secondary | ICD-10-CM | POA: Diagnosis not present

## 2019-08-27 ENCOUNTER — Encounter: Payer: Self-pay | Admitting: Podiatry

## 2019-08-27 ENCOUNTER — Ambulatory Visit: Payer: 59 | Admitting: Podiatry

## 2019-08-27 ENCOUNTER — Other Ambulatory Visit: Payer: Self-pay

## 2019-08-27 DIAGNOSIS — G5792 Unspecified mononeuropathy of left lower limb: Secondary | ICD-10-CM

## 2019-08-28 NOTE — Progress Notes (Signed)
She presents today stating that the neuritis has resolved to the first interspace however now it is in the second interspace.  Objective: Vital signs are stable she is alert and oriented x3.  She has no changes in physical exam.  She has mild tenderness on palpation to the second interspace of the left foot.  Assessment: Neuritis.  Plan: Injected second interdigital space with dehydrated alcohol.  Follow-up with her in 3 to 4 weeks

## 2019-09-02 ENCOUNTER — Other Ambulatory Visit: Payer: Self-pay | Admitting: Endocrinology

## 2019-09-02 DIAGNOSIS — M9901 Segmental and somatic dysfunction of cervical region: Secondary | ICD-10-CM | POA: Diagnosis not present

## 2019-09-02 DIAGNOSIS — E049 Nontoxic goiter, unspecified: Secondary | ICD-10-CM

## 2019-09-02 DIAGNOSIS — M5411 Radiculopathy, occipito-atlanto-axial region: Secondary | ICD-10-CM | POA: Diagnosis not present

## 2019-09-05 DIAGNOSIS — M9901 Segmental and somatic dysfunction of cervical region: Secondary | ICD-10-CM | POA: Diagnosis not present

## 2019-09-05 DIAGNOSIS — M5411 Radiculopathy, occipito-atlanto-axial region: Secondary | ICD-10-CM | POA: Diagnosis not present

## 2019-09-06 ENCOUNTER — Other Ambulatory Visit: Payer: 59

## 2019-09-09 DIAGNOSIS — M9901 Segmental and somatic dysfunction of cervical region: Secondary | ICD-10-CM | POA: Diagnosis not present

## 2019-09-09 DIAGNOSIS — M5411 Radiculopathy, occipito-atlanto-axial region: Secondary | ICD-10-CM | POA: Diagnosis not present

## 2019-09-10 ENCOUNTER — Ambulatory Visit
Admission: RE | Admit: 2019-09-10 | Discharge: 2019-09-10 | Disposition: A | Discharge status: Home / Self Care | Payer: 59 | Source: Ambulatory Visit | Attending: Endocrinology | Admitting: Endocrinology

## 2019-09-10 DIAGNOSIS — E049 Nontoxic goiter, unspecified: Secondary | ICD-10-CM

## 2019-09-10 DIAGNOSIS — E041 Nontoxic single thyroid nodule: Secondary | ICD-10-CM | POA: Diagnosis not present

## 2019-09-23 DIAGNOSIS — M9901 Segmental and somatic dysfunction of cervical region: Secondary | ICD-10-CM | POA: Diagnosis not present

## 2019-09-23 DIAGNOSIS — M5411 Radiculopathy, occipito-atlanto-axial region: Secondary | ICD-10-CM | POA: Diagnosis not present

## 2019-09-26 ENCOUNTER — Ambulatory Visit: Payer: 59 | Admitting: Podiatry

## 2019-09-30 MED FILL — LEVOTHYROXINE 50 MCG TABLET: 50 | 90 days supply | Qty: 90 | Fill #0

## 2019-09-30 MED FILL — traMADol HCL 50 MG TABS: 50 | 90 days supply | Qty: 180 | Fill #0

## 2019-10-07 DIAGNOSIS — M9901 Segmental and somatic dysfunction of cervical region: Secondary | ICD-10-CM | POA: Diagnosis not present

## 2019-10-07 DIAGNOSIS — M5411 Radiculopathy, occipito-atlanto-axial region: Secondary | ICD-10-CM | POA: Diagnosis not present

## 2019-10-21 DIAGNOSIS — M9901 Segmental and somatic dysfunction of cervical region: Secondary | ICD-10-CM | POA: Diagnosis not present

## 2019-10-21 DIAGNOSIS — M5411 Radiculopathy, occipito-atlanto-axial region: Secondary | ICD-10-CM | POA: Diagnosis not present

## 2019-10-22 ENCOUNTER — Other Ambulatory Visit: Payer: Self-pay | Admitting: Podiatry

## 2019-10-22 MED FILL — METFORMIN HCL ER 500 MG TB2: 500 | 90 days supply | Qty: 360 | Fill #1

## 2019-10-22 MED FILL — ZOLPIDEM TART ER 12.5 MG TA: 12.5 | 90 days supply | Qty: 90 | Fill #1

## 2019-10-22 MED FILL — UNIFINE PENTIPS 31GX3/16: 31G X 5 MM | 90 days supply | Qty: 500 | Fill #0

## 2019-10-22 MED FILL — FOLIC ACID 1 MG TABS: 1 | 90 days supply | Qty: 180 | Fill #1

## 2019-10-22 MED FILL — HUMALOG 100 UNITS/ML KWIKPE: 100 | 50 days supply | Qty: 45 | Fill #3

## 2019-10-22 MED FILL — HYDROXYCHLOROQUINE SULFATE: 200 | 90 days supply | Qty: 180 | Fill #1

## 2019-10-22 MED FILL — VALSARTAN 320 MG TAB: 320 | 90 days supply | Qty: 90 | Fill #1

## 2019-10-22 MED FILL — METHOTREXATE 25 MG/ML VIAL: 50 | 84 days supply | Qty: 10 | Fill #1

## 2019-10-22 MED FILL — LIDOCAINE PATCH 5%: 5 | 60 days supply | Qty: 60 | Fill #1

## 2019-10-22 MED FILL — DICLOFENAC SODIUM 1 % GEL: 1 | 30 days supply | Qty: 200 | Fill #0

## 2019-10-22 MED FILL — UNIFINE PENTIPS 31GX3/16": 31G X 5 MM | 90 days supply | Qty: 500 | Fill #0

## 2019-10-23 MED FILL — FREESTYLE LIBRE 14 DAY SENS: 84 days supply | Qty: 6 | Fill #0

## 2019-10-23 MED FILL — OMEPRAZOLE DR 40 MG CAPSULE: 40 | 30 days supply | Qty: 60 | Fill #0

## 2019-10-23 MED FILL — PEG-3350 SOLUTION: 420 | 1 days supply | Qty: 4000 | Fill #0

## 2019-10-23 MED FILL — DULoxetine HCL 60 MG CPEP: 60 | 90 days supply | Qty: 180 | Fill #0

## 2019-10-23 MED FILL — CYCLOBENZAPRINE 10 MG TAB: 10 | 30 days supply | Qty: 90 | Fill #0

## 2019-10-23 MED FILL — LANTUS SOLOSTAR 100 UNITS/M: 100 | 30 days supply | Qty: 27 | Fill #0

## 2019-10-23 MED FILL — MONTELUKAST SOD 10 MG TAB: 10 | 90 days supply | Qty: 90 | Fill #0

## 2019-10-23 MED FILL — LEVOCETIRIZINE 5 MG TABLET: 5 | 90 days supply | Qty: 90 | Fill #0

## 2019-10-23 MED FILL — ALBUTEROL SULFATE HFA 108 (: 108 (90 BAS | 75 days supply | Qty: 26 | Fill #0

## 2019-10-24 MED FILL — ALPRAZolam 1 MG TABS: 1 | 90 days supply | Qty: 270 | Fill #0

## 2019-10-24 MED FILL — FLUCONAZOLE 150 MG TABLET: 150 | 2 days supply | Qty: 2 | Fill #0

## 2019-10-24 MED FILL — GABAPENTIN 400 MG CAPSULE: 400 | 90 days supply | Qty: 270 | Fill #0

## 2019-10-28 DIAGNOSIS — Z1159 Encounter for screening for other viral diseases: Secondary | ICD-10-CM | POA: Diagnosis not present

## 2019-10-30 DIAGNOSIS — M5136 Other intervertebral disc degeneration, lumbar region: Secondary | ICD-10-CM | POA: Diagnosis not present

## 2019-10-30 DIAGNOSIS — E663 Overweight: Secondary | ICD-10-CM | POA: Diagnosis not present

## 2019-10-30 DIAGNOSIS — Z79899 Other long term (current) drug therapy: Secondary | ICD-10-CM | POA: Diagnosis not present

## 2019-10-30 DIAGNOSIS — M2550 Pain in unspecified joint: Secondary | ICD-10-CM | POA: Diagnosis not present

## 2019-10-30 DIAGNOSIS — L659 Nonscarring hair loss, unspecified: Secondary | ICD-10-CM | POA: Diagnosis not present

## 2019-10-30 DIAGNOSIS — M0589 Other rheumatoid arthritis with rheumatoid factor of multiple sites: Secondary | ICD-10-CM | POA: Diagnosis not present

## 2019-10-30 DIAGNOSIS — M150 Primary generalized (osteo)arthritis: Secondary | ICD-10-CM | POA: Diagnosis not present

## 2019-10-30 DIAGNOSIS — M25512 Pain in left shoulder: Secondary | ICD-10-CM | POA: Diagnosis not present

## 2019-10-30 DIAGNOSIS — Z6829 Body mass index (BMI) 29.0-29.9, adult: Secondary | ICD-10-CM | POA: Diagnosis not present

## 2019-10-31 DIAGNOSIS — K317 Polyp of stomach and duodenum: Secondary | ICD-10-CM | POA: Diagnosis not present

## 2019-10-31 DIAGNOSIS — R1310 Dysphagia, unspecified: Secondary | ICD-10-CM | POA: Diagnosis not present

## 2019-10-31 DIAGNOSIS — Z86010 Personal history of colonic polyps: Secondary | ICD-10-CM | POA: Diagnosis not present

## 2019-11-04 DIAGNOSIS — M5411 Radiculopathy, occipito-atlanto-axial region: Secondary | ICD-10-CM | POA: Diagnosis not present

## 2019-11-04 DIAGNOSIS — M9901 Segmental and somatic dysfunction of cervical region: Secondary | ICD-10-CM | POA: Diagnosis not present

## 2019-11-16 IMAGING — US US GUIDANCE NEEDLE PLACEMENT
1 series · 9 of 9 positions shown · non-contrast
Comparison: none

INDICATION: 52-year-old with postoperative seroma following a panniculectomy.
Patient is complaining of drainage at the incision site.

[Series 1: us guidance needle placement · 9 of 9 slices shown]
[im 1/9]
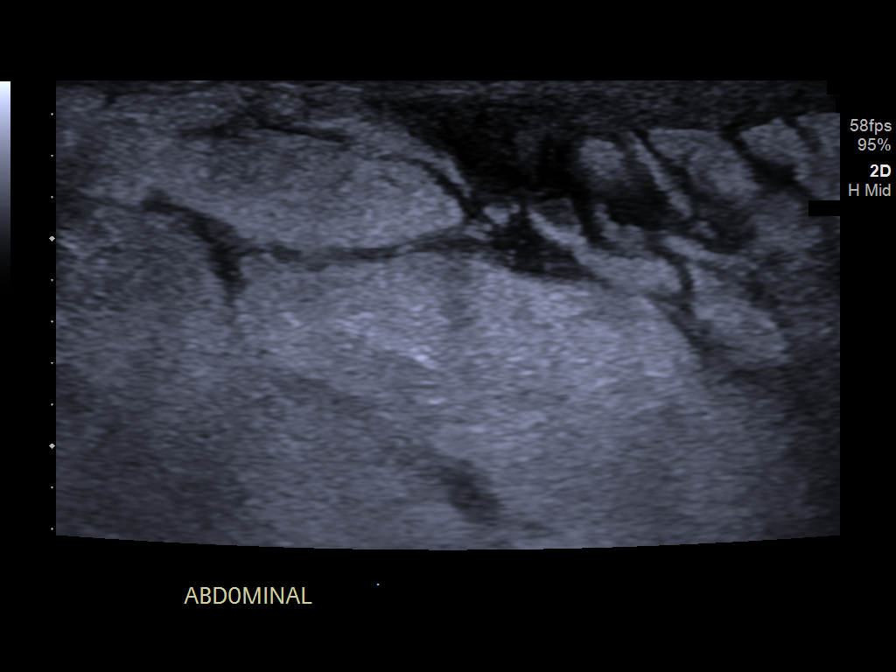
[im 2/9]
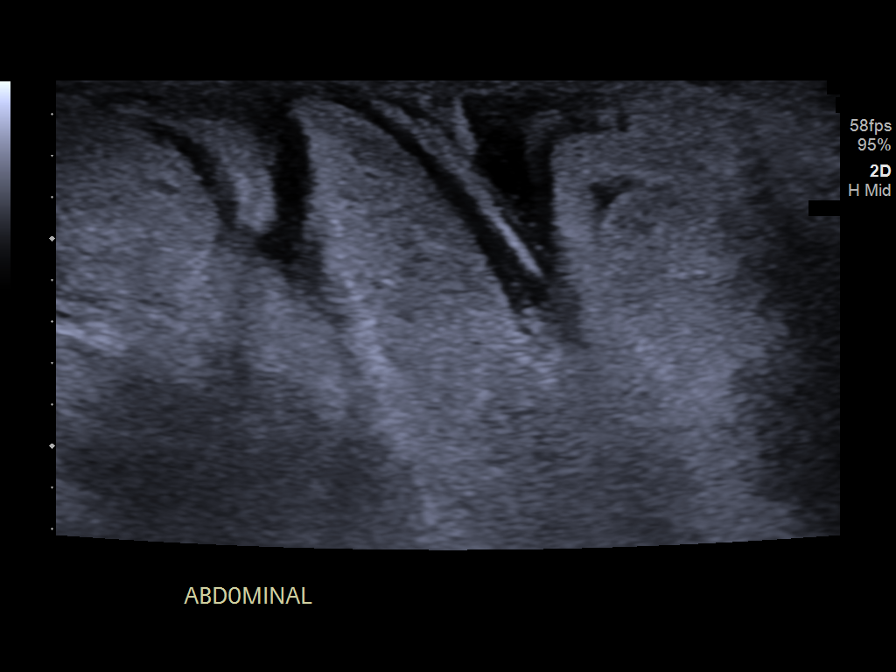
[im 3/9]
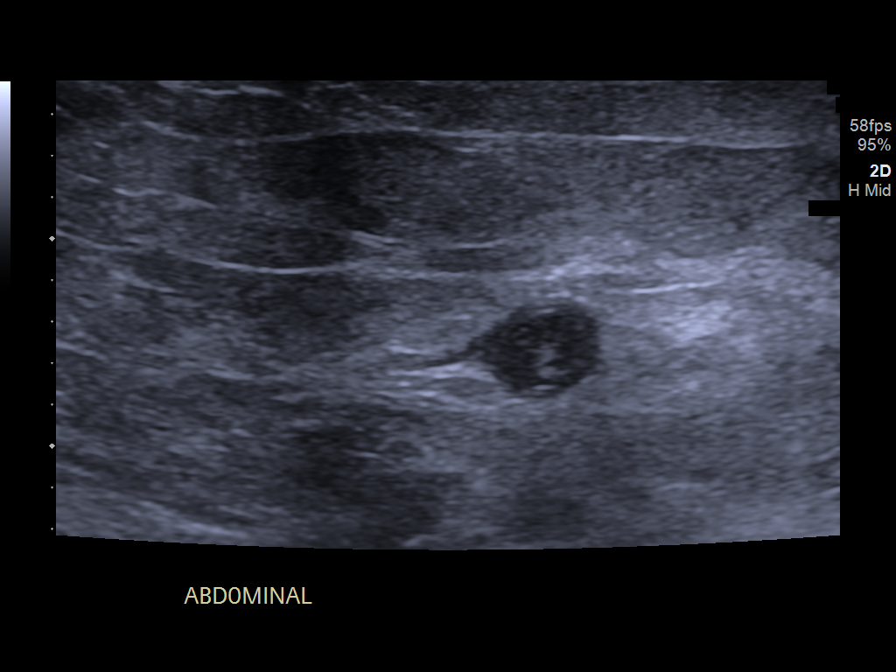
[im 4/9]
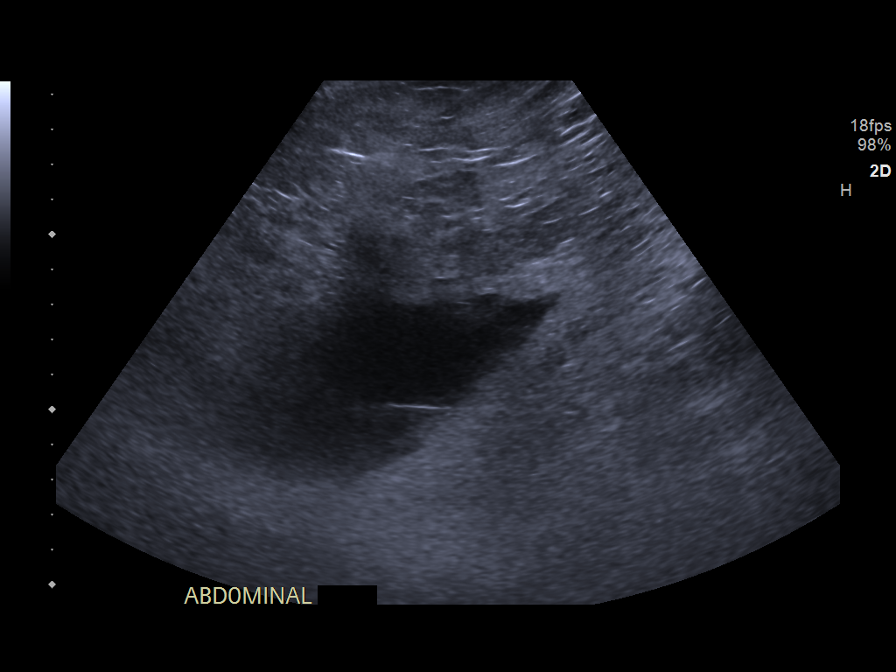
[im 5/9]
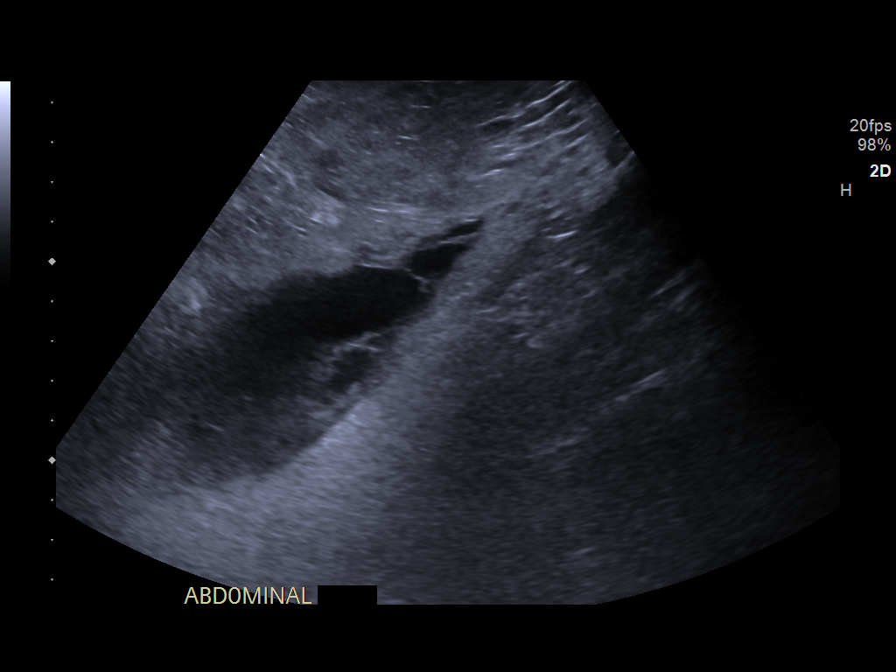
[im 6/9]
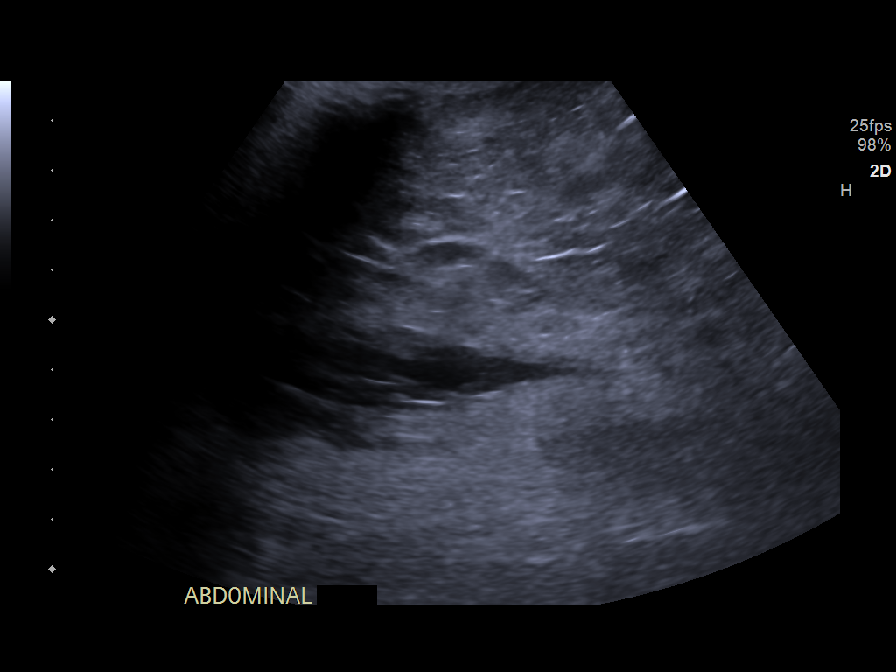
[im 7/9]
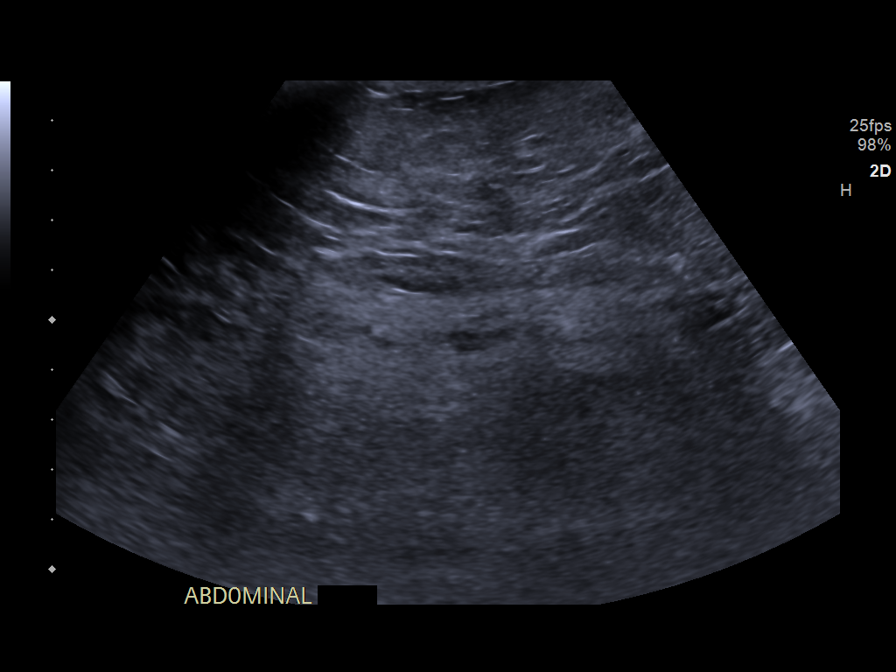
[im 8/9]
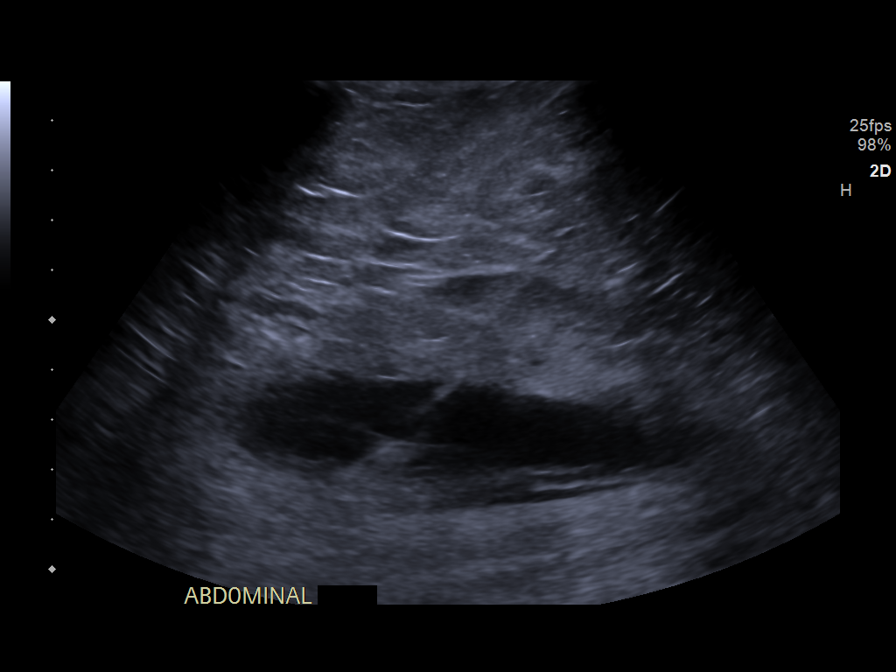
[im 9/9]
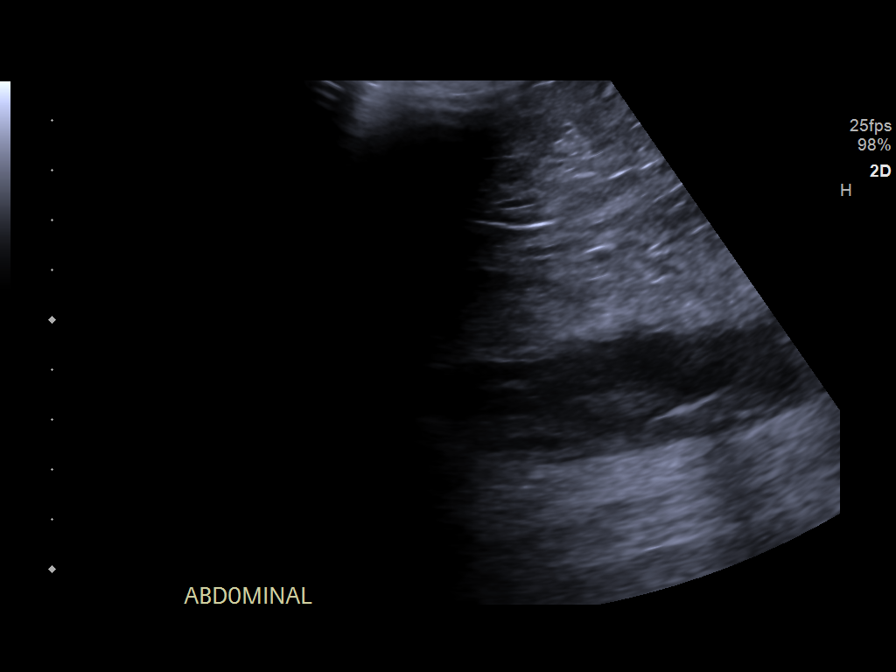

[9 of 9 positions shown; findings below may reference images not displayed]

EXAM:
ULTRASOUND-GUIDED PLACEMENT OF A DRAIN WITHIN THE LOWER ABDOMINAL
SUBCUTANEOUS SEROMA

MEDICATIONS:
None

ANESTHESIA/SEDATION:
Fentanyl 100 mcg IV; Versed 2.0 mg IV

Moderate Sedation Time:  15 minutes

The patient was continuously monitored during the procedure by the
interventional radiology nurse under my direct supervision.

COMPLICATIONS:
None immediate.

PROCEDURE:
Informed written consent was obtained from the patient after a
thorough discussion of the procedural risks, benefits and
alternatives. All questions were addressed. A timeout was performed
prior to the initiation of the procedure.

The lower abdominal subcutaneous tissues was evaluated with
ultrasound. Small amount of fluid was identified in the right lower
abdominal subcutaneous tissues. Skin was prepped with chlorhexidine
and sterile field was created. Yueh catheter was directed into the
fluid collection with ultrasound guidance. Mildly cloudy
serosanguineous fluid was aspirated. A stiff Amplatz wire was
advanced into the collection and the tract was dilated to
accommodate a 10 French drain. Approximately 100 mL of the
serosanguineous fluid was removed. Catheter was sutured to the skin
and attached to a suction bulb.
FINDINGS: Small amount of septated fluid in the right lower quadrant
subcutaneous tissues. No other significant fluid collections were
identified in the subcutaneous tissues. Drain was successfully
placed within the right lateral subcutaneous collection and
approximately 100 mL of serosanguineous fluid was removed.
IMPRESSION: Ultrasound-guided placement of a drainage catheter within the
postoperative seroma.

## 2019-11-19 ENCOUNTER — Telehealth: Payer: Self-pay | Admitting: *Deleted

## 2019-11-19 NOTE — Telephone Encounter (Signed)
Pt called and asked if we measured for compression stockings or knew where to get them.

## 2019-11-19 NOTE — Telephone Encounter (Signed)
I spoke with pt and asked if Dr. Milinda Pointer had seen her for the swelling and she stated it was for her ftr, he was sent to somewhere in [place] and due to [place] they were closed. I told pt that we sent pt's to [facility] and if he needed custom measured or made he should consult his PCP or Cardiovascular doctor. Pt states the PCP sent him to the place in [place]. Pt asked if we had the small toe covers Dr. Milinda Pointer usually gave her. I told pt that if she would pop in-office I would check.

## 2019-11-20 NOTE — Telephone Encounter (Signed)
Pt presents to office to pick up a left foot toe crest. I dispensed a medium toe crest for left foot.

## 2019-11-21 DIAGNOSIS — E7800 Pure hypercholesterolemia, unspecified: Secondary | ICD-10-CM | POA: Diagnosis not present

## 2019-11-21 DIAGNOSIS — E039 Hypothyroidism, unspecified: Secondary | ICD-10-CM | POA: Diagnosis not present

## 2019-11-21 DIAGNOSIS — E1165 Type 2 diabetes mellitus with hyperglycemia: Secondary | ICD-10-CM | POA: Diagnosis not present

## 2019-11-25 DIAGNOSIS — M5411 Radiculopathy, occipito-atlanto-axial region: Secondary | ICD-10-CM | POA: Diagnosis not present

## 2019-11-25 DIAGNOSIS — M9901 Segmental and somatic dysfunction of cervical region: Secondary | ICD-10-CM | POA: Diagnosis not present

## 2019-11-28 DIAGNOSIS — E1165 Type 2 diabetes mellitus with hyperglycemia: Secondary | ICD-10-CM | POA: Diagnosis not present

## 2019-11-28 DIAGNOSIS — G609 Hereditary and idiopathic neuropathy, unspecified: Secondary | ICD-10-CM | POA: Diagnosis not present

## 2019-11-28 DIAGNOSIS — I10 Essential (primary) hypertension: Secondary | ICD-10-CM | POA: Diagnosis not present

## 2019-11-28 DIAGNOSIS — E049 Nontoxic goiter, unspecified: Secondary | ICD-10-CM | POA: Diagnosis not present

## 2019-11-28 DIAGNOSIS — E039 Hypothyroidism, unspecified: Secondary | ICD-10-CM | POA: Diagnosis not present

## 2019-11-28 DIAGNOSIS — E7800 Pure hypercholesterolemia, unspecified: Secondary | ICD-10-CM | POA: Diagnosis not present

## 2019-12-13 IMAGING — CT CT IMAGE GUIDED DRAINAGE BY PERCUTANEOUS CATHETER
1 of 2 series · 14 of 32 positions shown, 18 images · non-contrast
Comparison: none

CLINICAL DATA: Postop seroma post panniculectomy. The previous
percutaneous drain [REDACTED], marginalized and was removed. A surgical
drain was recently placed through a defect in the anterior midline
but has stopped draining. CT shows moderate residual deep
subcutaneous fluid.

[Series 2: i-spiral 5.0 b40f · axial · 0.98mm/px · z∈[+675,+1032]mm · 14 of 112 slices shown, 18 images]
[im 5/112  soft-tissue]
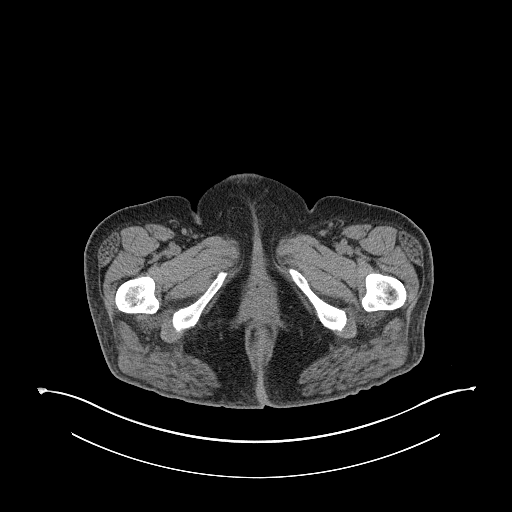
[im 5/112  bone]
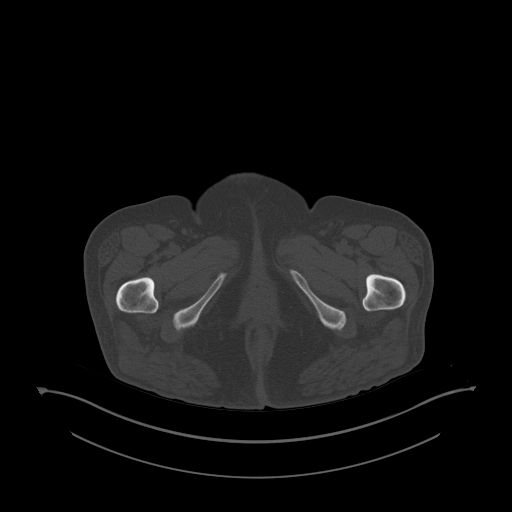
[im 14/112  soft-tissue]
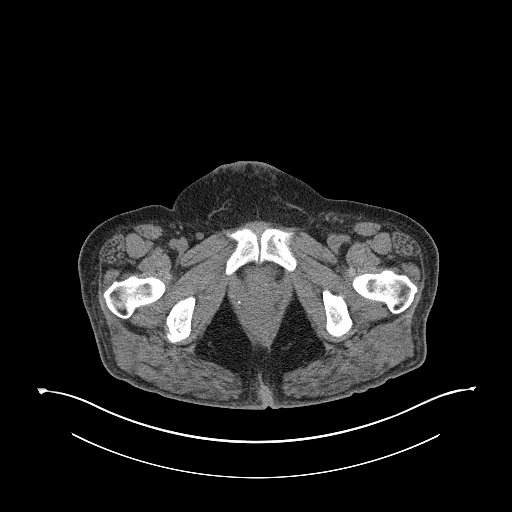
[im 24/112  soft-tissue]
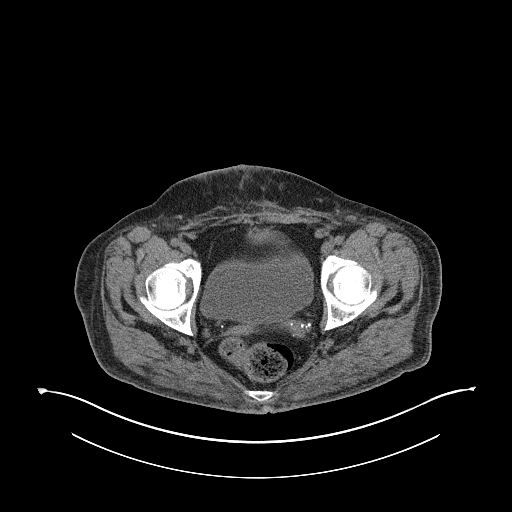
[im 33/112  soft-tissue]
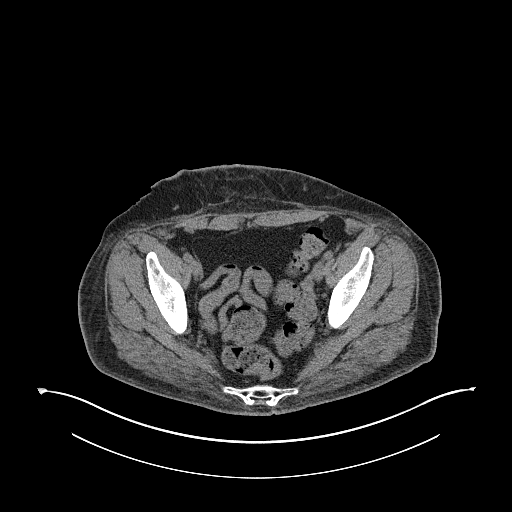
[im 42/112  soft-tissue]
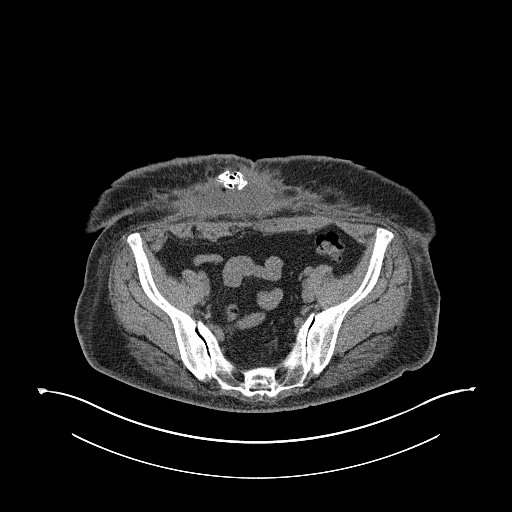
[im 51/112  soft-tissue]
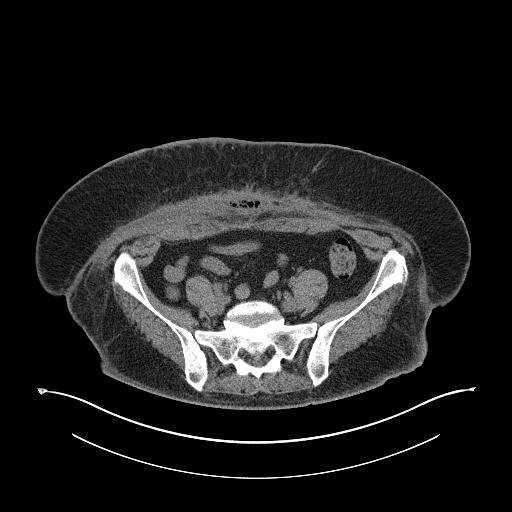
[im 61/112  soft-tissue]
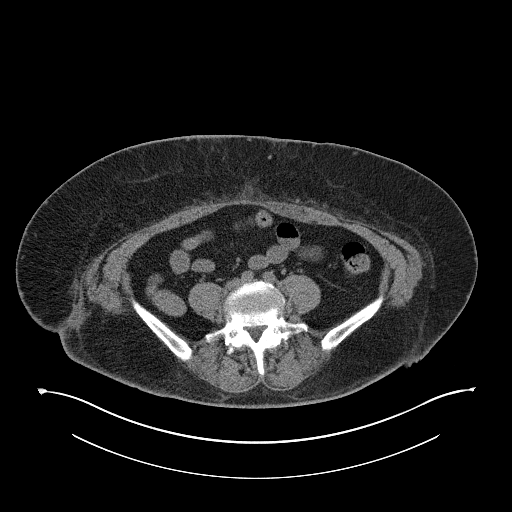
[im 70/112  soft-tissue]
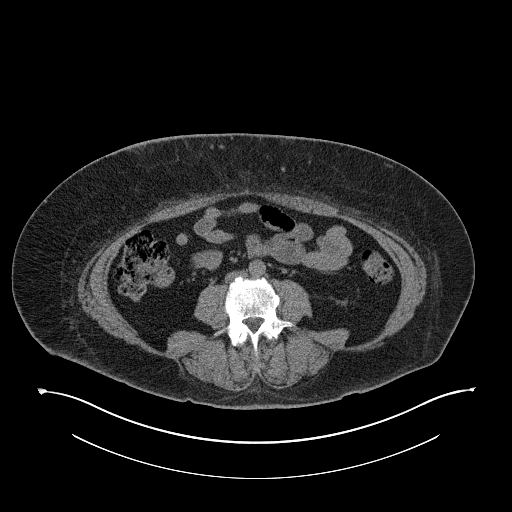
[im 79/112  soft-tissue]
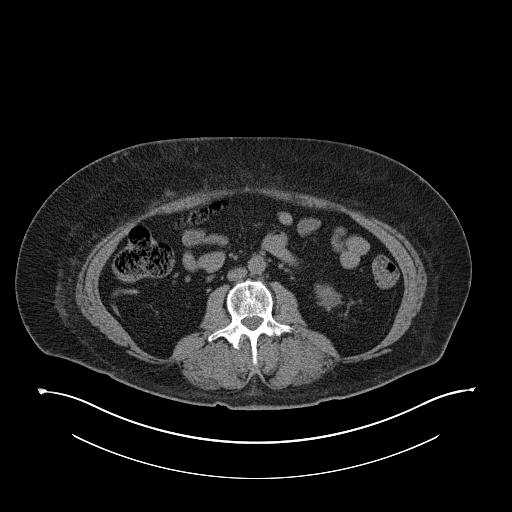
[im 79/112  bone]
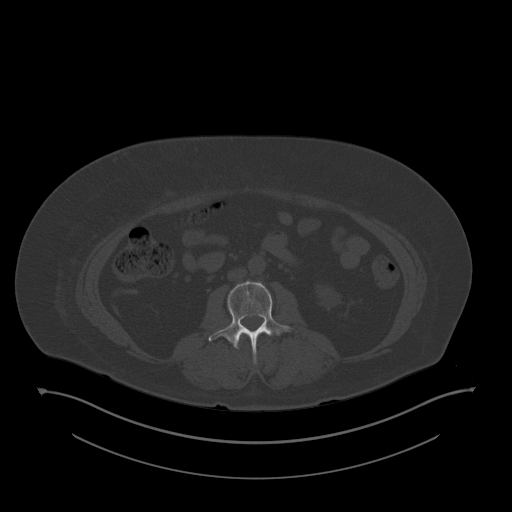
[im 88/112  soft-tissue]
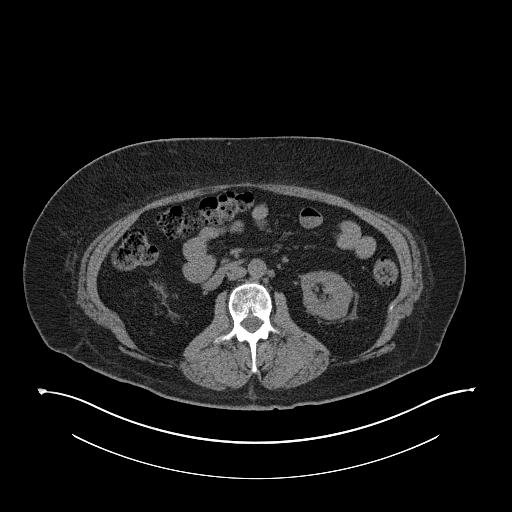
[im 93/112  lung]
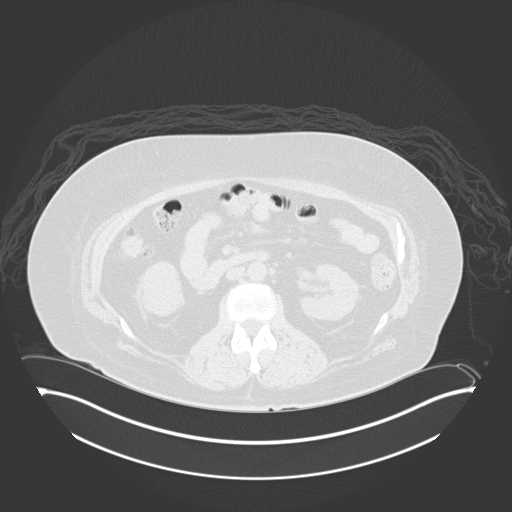
[im 98/112  soft-tissue]
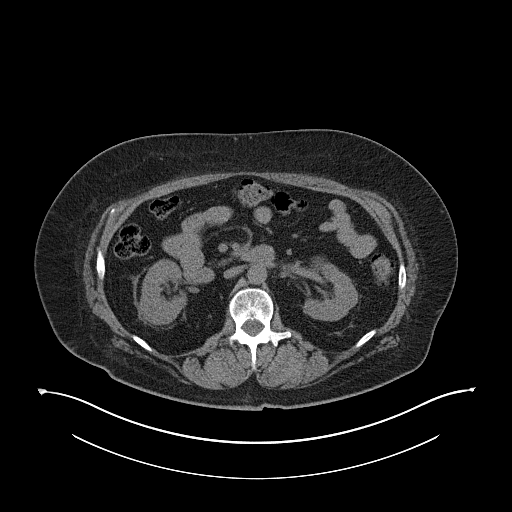
[im 98/112  lung]
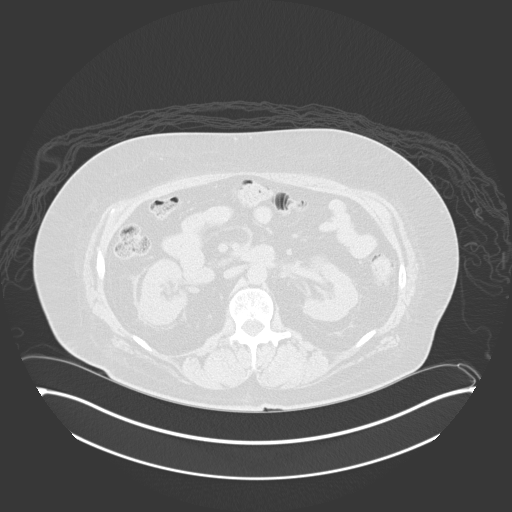
[im 102/112  lung]
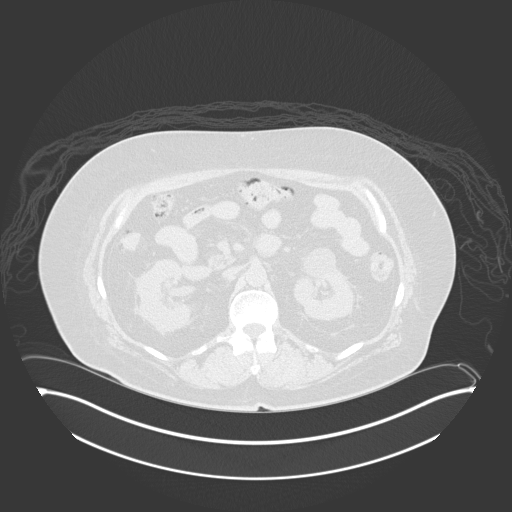
[im 107/112  soft-tissue]
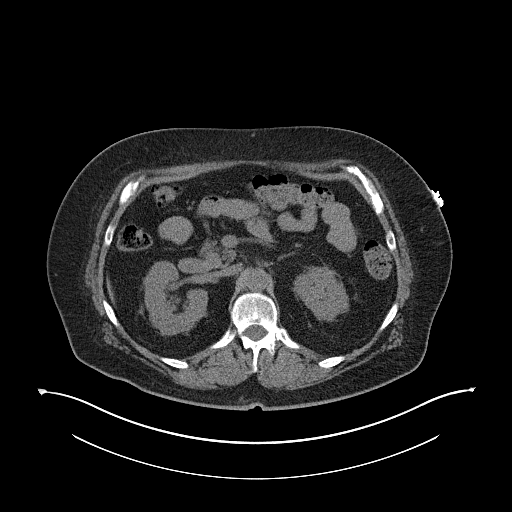
[im 107/112  lung]
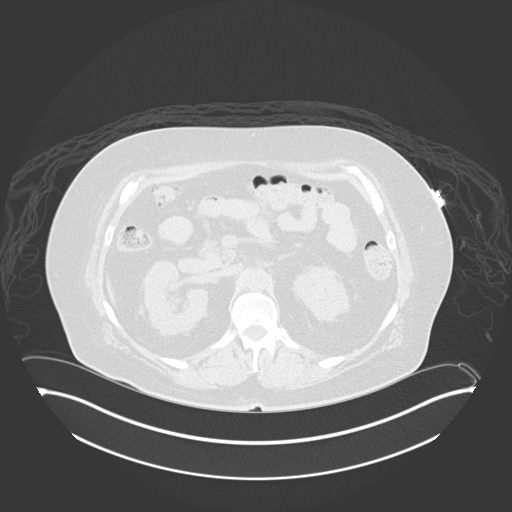

[14 of 32 positions shown; findings below may reference images not displayed]

EXAM:
CT GUIDED DRAINAGE OF ANTERIOR BODY WALL COLLECTION

ANESTHESIA/SEDATION:
Intravenous Fentanyl and Versed were administered as conscious
sedation during continuous monitoring of the patient's level of
consciousness and physiological / cardiorespiratory status by the
radiology RN, with a total moderate sedation time of 18 minutes.

PROCEDURE:
The procedure, risks, benefits, and alternatives were explained to
the patient. Questions regarding the procedure were encouraged and
answered. The patient understands and consents to the procedure.

select axial scans through the pelvis were obtained. the anterior
abdominal wall collection was localized and an appropriate skin
entry site was determined and marked.

The operative field was prepped with chlorhexidinein a sterile
fashion, and a sterile drape was applied covering the operative
field. A sterile gown and sterile gloves were used for the
procedure. Local anesthesia was provided with 1% Lidocaine.

Under CT fluoroscopic guidance, an 18 gauge trocar needle was
advanced into the collection from a left lateral approach. An
Amplatz guidewire advanced easily within the collection, its
position confirmed on CT. Tract dilated to facilitate placement of a
16 Ke Nna Kena percutaneous drain catheter, placed across the
collection. The catheter was secured externally with 0 Prolene
suture and StatLock and placed to gravity drain bag. The patient
tolerated the procedure well.

COMPLICATIONS:
None immediate
FINDINGS: Moderate deep loculated fluid collection in the anterior pelvic body
wall. A radiodense drain is noted crumpled in the anterior aspect of
the collection just to the right of midline, extending through the
skin surface.. 16 Ke Nna Kena catheter placed into the residual
component.
IMPRESSION: 1. Technically successful CT-guided subcutaneous seroma drain
placement as above.

## 2019-12-16 DIAGNOSIS — E782 Mixed hyperlipidemia: Secondary | ICD-10-CM | POA: Diagnosis not present

## 2019-12-16 DIAGNOSIS — E559 Vitamin D deficiency, unspecified: Secondary | ICD-10-CM | POA: Diagnosis not present

## 2019-12-16 DIAGNOSIS — F419 Anxiety disorder, unspecified: Secondary | ICD-10-CM | POA: Diagnosis not present

## 2019-12-16 DIAGNOSIS — F329 Major depressive disorder, single episode, unspecified: Secondary | ICD-10-CM | POA: Diagnosis not present

## 2019-12-16 DIAGNOSIS — G4700 Insomnia, unspecified: Secondary | ICD-10-CM | POA: Diagnosis not present

## 2019-12-16 DIAGNOSIS — M545 Low back pain: Secondary | ICD-10-CM | POA: Diagnosis not present

## 2019-12-16 DIAGNOSIS — J309 Allergic rhinitis, unspecified: Secondary | ICD-10-CM | POA: Diagnosis not present

## 2019-12-16 DIAGNOSIS — I10 Essential (primary) hypertension: Secondary | ICD-10-CM | POA: Diagnosis not present

## 2019-12-16 DIAGNOSIS — K219 Gastro-esophageal reflux disease without esophagitis: Secondary | ICD-10-CM | POA: Diagnosis not present

## 2019-12-17 DIAGNOSIS — M5411 Radiculopathy, occipito-atlanto-axial region: Secondary | ICD-10-CM | POA: Diagnosis not present

## 2019-12-17 DIAGNOSIS — M9901 Segmental and somatic dysfunction of cervical region: Secondary | ICD-10-CM | POA: Diagnosis not present

## 2019-12-23 ENCOUNTER — Other Ambulatory Visit: Payer: Self-pay

## 2019-12-23 ENCOUNTER — Encounter: Payer: Self-pay | Admitting: Registered"

## 2019-12-23 ENCOUNTER — Encounter: Payer: 59 | Attending: Endocrinology | Admitting: Registered"

## 2019-12-23 DIAGNOSIS — E1142 Type 2 diabetes mellitus with diabetic polyneuropathy: Secondary | ICD-10-CM | POA: Insufficient documentation

## 2019-12-23 DIAGNOSIS — Z794 Long term (current) use of insulin: Secondary | ICD-10-CM | POA: Insufficient documentation

## 2019-12-23 NOTE — Patient Instructions (Signed)
Consider stress management as part of your diabetes care and weight loss plan Continue to eat balanced meals for lunch.  Consider including protein with your evening meals.

## 2019-12-23 NOTE — Progress Notes (Signed)
[employer] Employee visit 1 of 3  Diabetes Self-Management Education  Visit Type: Follow-up  Appt. Start Time: 0800 Appt. End Time: 0830  12/23/2019  [name], identified by name and date of birth, is a 54 y.o. female with a diagnosis of Diabetes:  .   ASSESSMENT  There were no vitals taken for this visit. There is no height or weight on file to calculate BMI.   Patient states A1c continues to improve, now at 7.1%. Pt reports Dr. Chalmers Cater told her that if she loses 15-20 lbs she'll be off all the insulin. Per patient current DM medication Humalog 25u divided in 3 meals sliding scale as needed, Metformin 1,000 HS.  Patient is concerned about getting vaccine, but wants to feel safe going back to the gym.   Pt reports at last MD visit her Vitamin D was checked but not sure what it is yet.  Diabetes Self-Management Education - 12/23/19 0916      Visit Information   Visit Type  Follow-up      Psychosocial Assessment   How often do you need to have someone help you when you read instructions, pamphlets, or other written materials from your doctor or pharmacy?  1 - Never    What is the last grade level you completed in school?  12      Complications   Last HgB A1C per patient/outside source  7.1 %   per patient   How often do you check your blood sugar?  > 4 times/day    Number of hypoglycemic episodes per month  2    Can you tell when your blood sugar is low?  Yes    What do you do if your blood sugar is low?  treat with CHO      Dietary Intake   Breakfast  cereal bar    Snack (morning)  cereal bar    Lunch  [employer] grilled chicken    Snack (afternoon)  pudding or fruit cup OR deli meat    Dinner  pork chop, Brussels sprouts, mac & cheese OR ziti lasagne    Snack (evening)  fruit or nuts    Beverage(s)  Water, flavored water      Exercise   Exercise Type  Light (walking / raking leaves)    How many days per week to you exercise?  7    How many minutes per day do you exercise?   --   7-15 K steps     Patient Education   Previous Diabetes Education  Yes (please comment)    Psychosocial adjustment  Role of stress on diabetes      Outcomes   Expected Outcomes  Demonstrated interest in learning. Expect positive outcomes    Future DMSE  4-6 wks    Program Status  Completed      Subsequent Visit   Since your last visit have you continued or begun to take your medications as prescribed?  Yes    Since your last visit have you had your blood pressure checked?  Yes    Is your most recent blood pressure lower, unchanged, or higher since your last visit?  Lower    Since your last visit have you experienced any weight changes?  Gain    Since your last visit, are you checking your blood glucose at least once a day?  Yes       Individualized Plan for Diabetes Self-Management Training:   Learning Objective:  Patient will  have a greater understanding of diabetes self-management. Patient education plan is to attend individual and/or group sessions per assessed needs and concerns.   Patient Instructions  Consider stress management as part of your diabetes care and weight loss plan Continue to eat balanced meals for lunch.  Consider including protein with your evening meals.    Expected Outcomes:  Demonstrated interest in learning. Expect positive outcomes  Education material provided: none  If problems or questions, patient to contact team via:  Phone  Future DSME appointment: 4-6 wks

## 2019-12-26 MED FILL — predniSONE 5 MG TABS: 5 | 12 days supply | Qty: 48 | Fill #0

## 2019-12-27 ENCOUNTER — Telehealth: Payer: Self-pay | Admitting: *Deleted

## 2019-12-27 MED ORDER — CYCLOBENZAPRINE HCL 10 MG PO TABS: 10.0000 mg | ORAL_TABLET | Freq: Three times a day (TID) | ORAL | 3 refills | Status: DC

## 2019-12-27 MED FILL — CYCLOBENZAPRINE HCL 10 MG T: 10 | 90 days supply | Qty: 270 | Fill #0

## 2019-12-27 NOTE — Telephone Encounter (Signed)
-----   Message from Viviana Simpler, [facility] sent at 12/27/2019  6:55 AM EST ----- Amy Adams, the patient called and left a message stating that she needed a refill of the flexeril and patient takes 3 a day and would like to get the 90 day supply which patient stated would be 270 pills due to the spasms in her feet due to patient has ran out of the medicine and the patient's phone number is [phone]. Thanks Lattie Haw

## 2019-12-27 NOTE — Telephone Encounter (Signed)
Patient is requesting a refill on Flexeril 10 mg- 3 month supply (270 pills). Please call when the prescription has been called in.

## 2020-01-08 ENCOUNTER — Encounter: Payer: Self-pay | Admitting: Podiatry

## 2020-01-15 DIAGNOSIS — M5411 Radiculopathy, occipito-atlanto-axial region: Secondary | ICD-10-CM | POA: Diagnosis not present

## 2020-01-15 DIAGNOSIS — M9901 Segmental and somatic dysfunction of cervical region: Secondary | ICD-10-CM | POA: Diagnosis not present

## 2020-01-17 DIAGNOSIS — H04123 Dry eye syndrome of bilateral lacrimal glands: Secondary | ICD-10-CM | POA: Diagnosis not present

## 2020-01-17 DIAGNOSIS — Z79899 Other long term (current) drug therapy: Secondary | ICD-10-CM | POA: Diagnosis not present

## 2020-01-17 DIAGNOSIS — H1045 Other chronic allergic conjunctivitis: Secondary | ICD-10-CM | POA: Diagnosis not present

## 2020-01-24 MED FILL — predniSONE 5 MG TABS: 5 | 12 days supply | Qty: 48 | Fill #0

## 2020-01-31 DIAGNOSIS — M150 Primary generalized (osteo)arthritis: Secondary | ICD-10-CM | POA: Diagnosis not present

## 2020-01-31 DIAGNOSIS — Z79899 Other long term (current) drug therapy: Secondary | ICD-10-CM | POA: Diagnosis not present

## 2020-01-31 DIAGNOSIS — Z6829 Body mass index (BMI) 29.0-29.9, adult: Secondary | ICD-10-CM | POA: Diagnosis not present

## 2020-01-31 DIAGNOSIS — M5136 Other intervertebral disc degeneration, lumbar region: Secondary | ICD-10-CM | POA: Diagnosis not present

## 2020-01-31 DIAGNOSIS — M0589 Other rheumatoid arthritis with rheumatoid factor of multiple sites: Secondary | ICD-10-CM | POA: Diagnosis not present

## 2020-01-31 DIAGNOSIS — M2550 Pain in unspecified joint: Secondary | ICD-10-CM | POA: Diagnosis not present

## 2020-01-31 DIAGNOSIS — E663 Overweight: Secondary | ICD-10-CM | POA: Diagnosis not present

## 2020-01-31 MED FILL — traMADol HCL 50 MG TABS: 50 | 30 days supply | Qty: 120 | Fill #0

## 2020-02-05 DIAGNOSIS — M9901 Segmental and somatic dysfunction of cervical region: Secondary | ICD-10-CM | POA: Diagnosis not present

## 2020-02-05 DIAGNOSIS — M5411 Radiculopathy, occipito-atlanto-axial region: Secondary | ICD-10-CM | POA: Diagnosis not present

## 2020-02-25 ENCOUNTER — Other Ambulatory Visit: Payer: Self-pay

## 2020-02-25 ENCOUNTER — Ambulatory Visit: Payer: 59

## 2020-02-25 ENCOUNTER — Ambulatory Visit: Payer: 59 | Admitting: Podiatry

## 2020-02-25 DIAGNOSIS — E1142 Type 2 diabetes mellitus with diabetic polyneuropathy: Secondary | ICD-10-CM

## 2020-02-25 DIAGNOSIS — M216X1 Other acquired deformities of right foot: Secondary | ICD-10-CM | POA: Diagnosis not present

## 2020-02-25 DIAGNOSIS — M216X2 Other acquired deformities of left foot: Secondary | ICD-10-CM | POA: Diagnosis not present

## 2020-02-25 DIAGNOSIS — M778 Other enthesopathies, not elsewhere classified: Secondary | ICD-10-CM

## 2020-02-25 NOTE — Progress Notes (Signed)
Presents today for follow-up of experiencing blisters and pain beneath the metatarsals of the forefoot. Has recently purchased a new pair shoes states that the neuritis has reduced considerably in the forefoot bilaterally.  Objective: Vital signs stable alert oriented x3 pulses are palpable decreased some on the left. Neurologic sensorium is unchanged. No open lesions or wounds are noted. Though she does have a prominent third metatarsal of the left foot for the second toe and metatarsal been amputated.  Assessment: Diabetes mellitus diabetic peripheral neuropathy resolving neuritis deep peroneal nerve left plantarflexed metatarsals.  Plan: We are going to get her orthotics reevaluated and remade and will follow up with her in the near future.

## 2020-03-02 ENCOUNTER — Ambulatory Visit: Payer: 59 | Admitting: Registered"

## 2020-03-02 DIAGNOSIS — M5411 Radiculopathy, occipito-atlanto-axial region: Secondary | ICD-10-CM | POA: Diagnosis not present

## 2020-03-02 DIAGNOSIS — M9901 Segmental and somatic dysfunction of cervical region: Secondary | ICD-10-CM | POA: Diagnosis not present

## 2020-03-02 NOTE — Progress Notes (Deleted)
[facility] Employee visit 2 of 3  Diabetes Self-Management Education  Visit Type: Follow-up  Appt. Start Time: 0800 Appt. End Time: 0830  12/23/2019  [name], identified by name and date of birth, is a 54 y.o. female with a diagnosis of Diabetes:  .   ASSESSMENT  There were no vitals taken for this visit. There is no height or weight on file to calculate BMI.   Patient states A1c continues to improve, now at 7.1%. Pt reports Dr. Chalmers Adams told her that if she loses 15-20 lbs she'll be off all the insulin. Per patient current DM medication Humalog 25u divided in 3 meals sliding scale as needed, Metformin 1,000 HS.  Patient is concerned about getting vaccine, but wants to feel safe going back to the gym.   Pt reports at last MD visit her Vitamin D was checked but not sure what it is yet.  Diabetes Self-Management Education - 12/23/19 0916      Visit Information   Visit Type  Follow-up      Psychosocial Assessment   How often do you need to have someone help you when you read instructions, pamphlets, or other written materials from your doctor or pharmacy?  1 - Never    What is the last grade level you completed in school?  12      Complications   Last HgB A1C per patient/outside source  7.1 %   per patient   How often do you check your blood sugar?  > 4 times/day    Number of hypoglycemic episodes per month  2    Can you tell when your blood sugar is low?  Yes    What do you do if your blood sugar is low?  treat with CHO      Dietary Intake   Breakfast  cereal bar    Snack (morning)  cereal bar    Lunch  [employer] grilled chicken    Snack (afternoon)  pudding or fruit cup OR deli meat    Dinner  pork chop, Brussels sprouts, mac & cheese OR ziti lasagne    Snack (evening)  fruit or nuts    Beverage(s)  Water, flavored water      Exercise   Exercise Type  Light (walking / raking leaves)    How many days per week to you exercise?  7    How many minutes per day do you exercise?   --   7-15 K steps     Patient Education   Previous Diabetes Education  Yes (please comment)    Psychosocial adjustment  Role of stress on diabetes      Outcomes   Expected Outcomes  Demonstrated interest in learning. Expect positive outcomes    Future DMSE  4-6 wks    Program Status  Completed      Subsequent Visit   Since your last visit have you continued or begun to take your medications as prescribed?  Yes    Since your last visit have you had your blood pressure checked?  Yes    Is your most recent blood pressure lower, unchanged, or higher since your last visit?  Lower    Since your last visit have you experienced any weight changes?  Gain    Since your last visit, are you checking your blood glucose at least once a day?  Yes       Individualized Plan for Diabetes Self-Management Training:   Learning Objective:  Patient will  have a greater understanding of diabetes self-management. Patient education plan is to attend individual and/or group sessions per assessed needs and concerns.   Patient Instructions  Consider stress management as part of your diabetes care and weight loss plan Continue to eat balanced meals for lunch.  Consider including protein with your evening meals.    Expected Outcomes:  Demonstrated interest in learning. Expect positive outcomes  Education material provided: none  If problems or questions, patient to contact team via:  Phone  Future DSME appointment: 4-6 wks

## 2020-03-03 DIAGNOSIS — L84 Corns and callosities: Secondary | ICD-10-CM | POA: Diagnosis not present

## 2020-03-03 DIAGNOSIS — D225 Melanocytic nevi of trunk: Secondary | ICD-10-CM | POA: Diagnosis not present

## 2020-03-17 ENCOUNTER — Encounter: Payer: 59 | Admitting: Orthotics

## 2020-03-17 ENCOUNTER — Other Ambulatory Visit: Payer: Self-pay

## 2020-03-23 DIAGNOSIS — M5411 Radiculopathy, occipito-atlanto-axial region: Secondary | ICD-10-CM | POA: Diagnosis not present

## 2020-03-23 DIAGNOSIS — M9901 Segmental and somatic dysfunction of cervical region: Secondary | ICD-10-CM | POA: Diagnosis not present

## 2020-03-24 ENCOUNTER — Other Ambulatory Visit (HOSPITAL_COMMUNITY): Payer: Self-pay | Admitting: Endocrinology

## 2020-03-31 ENCOUNTER — Ambulatory Visit: Payer: 59 | Admitting: Orthotics

## 2020-03-31 ENCOUNTER — Other Ambulatory Visit: Payer: Self-pay

## 2020-03-31 DIAGNOSIS — E1142 Type 2 diabetes mellitus with diabetic polyneuropathy: Secondary | ICD-10-CM

## 2020-03-31 NOTE — Progress Notes (Signed)
refurbishin f/o

## 2020-04-15 DIAGNOSIS — M5432 Sciatica, left side: Secondary | ICD-10-CM | POA: Diagnosis not present

## 2020-04-15 DIAGNOSIS — M9903 Segmental and somatic dysfunction of lumbar region: Secondary | ICD-10-CM | POA: Diagnosis not present

## 2020-05-11 DIAGNOSIS — M5136 Other intervertebral disc degeneration, lumbar region: Secondary | ICD-10-CM | POA: Diagnosis not present

## 2020-05-11 DIAGNOSIS — M0589 Other rheumatoid arthritis with rheumatoid factor of multiple sites: Secondary | ICD-10-CM | POA: Diagnosis not present

## 2020-05-11 DIAGNOSIS — Z79899 Other long term (current) drug therapy: Secondary | ICD-10-CM | POA: Diagnosis not present

## 2020-05-11 DIAGNOSIS — Z6829 Body mass index (BMI) 29.0-29.9, adult: Secondary | ICD-10-CM | POA: Diagnosis not present

## 2020-05-11 DIAGNOSIS — M150 Primary generalized (osteo)arthritis: Secondary | ICD-10-CM | POA: Diagnosis not present

## 2020-05-11 DIAGNOSIS — M2550 Pain in unspecified joint: Secondary | ICD-10-CM | POA: Diagnosis not present

## 2020-05-11 DIAGNOSIS — M5432 Sciatica, left side: Secondary | ICD-10-CM | POA: Diagnosis not present

## 2020-05-11 DIAGNOSIS — E663 Overweight: Secondary | ICD-10-CM | POA: Diagnosis not present

## 2020-05-11 DIAGNOSIS — L401 Generalized pustular psoriasis: Secondary | ICD-10-CM | POA: Diagnosis not present

## 2020-05-11 DIAGNOSIS — M9903 Segmental and somatic dysfunction of lumbar region: Secondary | ICD-10-CM | POA: Diagnosis not present

## 2020-05-20 DIAGNOSIS — E7800 Pure hypercholesterolemia, unspecified: Secondary | ICD-10-CM | POA: Diagnosis not present

## 2020-05-20 DIAGNOSIS — E1165 Type 2 diabetes mellitus with hyperglycemia: Secondary | ICD-10-CM | POA: Diagnosis not present

## 2020-05-20 DIAGNOSIS — E039 Hypothyroidism, unspecified: Secondary | ICD-10-CM | POA: Diagnosis not present

## 2020-05-27 ENCOUNTER — Other Ambulatory Visit: Payer: Self-pay | Admitting: Endocrinology

## 2020-05-27 DIAGNOSIS — I10 Essential (primary) hypertension: Secondary | ICD-10-CM | POA: Diagnosis not present

## 2020-05-27 DIAGNOSIS — E049 Nontoxic goiter, unspecified: Secondary | ICD-10-CM | POA: Diagnosis not present

## 2020-05-27 DIAGNOSIS — E7800 Pure hypercholesterolemia, unspecified: Secondary | ICD-10-CM | POA: Diagnosis not present

## 2020-05-27 DIAGNOSIS — E1165 Type 2 diabetes mellitus with hyperglycemia: Secondary | ICD-10-CM | POA: Diagnosis not present

## 2020-05-27 DIAGNOSIS — G609 Hereditary and idiopathic neuropathy, unspecified: Secondary | ICD-10-CM | POA: Diagnosis not present

## 2020-05-27 DIAGNOSIS — E039 Hypothyroidism, unspecified: Secondary | ICD-10-CM | POA: Diagnosis not present

## 2020-05-28 ENCOUNTER — Telehealth: Payer: Self-pay | Admitting: Podiatry

## 2020-05-28 NOTE — Telephone Encounter (Signed)
Dr.Balan said the blood flow and pulse lft foot  was not good wanted to know what she would need to do next

## 2020-05-28 NOTE — Telephone Encounter (Signed)
I informed pt she should follow up with [facility] as referred by Dr. Chalmers Cater today and may keep Dr. Milinda Pointer scheduled appt.

## 2020-05-29 ENCOUNTER — Telehealth: Payer: Self-pay | Admitting: Podiatry

## 2020-05-29 NOTE — Telephone Encounter (Signed)
Pt called wanting to let the nurse know that she spoke with [facility] and is unable to schedule an appt until September to have her Doppler done.

## 2020-06-01 NOTE — Telephone Encounter (Signed)
I spoke with pt and asked if Dr. Chalmers Cater ad referred her and if so she would be the one to up grade her referral since she was the last to see her. I told pt that [facility] also had a schedule for canceled appts and she could call periodically for that.

## 2020-06-02 ENCOUNTER — Ambulatory Visit: Payer: 59 | Admitting: Podiatry

## 2020-06-08 DIAGNOSIS — M9903 Segmental and somatic dysfunction of lumbar region: Secondary | ICD-10-CM | POA: Diagnosis not present

## 2020-06-08 DIAGNOSIS — M5432 Sciatica, left side: Secondary | ICD-10-CM | POA: Diagnosis not present

## 2020-06-11 DIAGNOSIS — Z6829 Body mass index (BMI) 29.0-29.9, adult: Secondary | ICD-10-CM | POA: Diagnosis not present

## 2020-06-11 DIAGNOSIS — E663 Overweight: Secondary | ICD-10-CM | POA: Diagnosis not present

## 2020-06-11 DIAGNOSIS — M5136 Other intervertebral disc degeneration, lumbar region: Secondary | ICD-10-CM | POA: Diagnosis not present

## 2020-06-11 DIAGNOSIS — L401 Generalized pustular psoriasis: Secondary | ICD-10-CM | POA: Diagnosis not present

## 2020-06-11 DIAGNOSIS — M0589 Other rheumatoid arthritis with rheumatoid factor of multiple sites: Secondary | ICD-10-CM | POA: Diagnosis not present

## 2020-06-11 DIAGNOSIS — M150 Primary generalized (osteo)arthritis: Secondary | ICD-10-CM | POA: Diagnosis not present

## 2020-06-11 DIAGNOSIS — Z79899 Other long term (current) drug therapy: Secondary | ICD-10-CM | POA: Diagnosis not present

## 2020-06-11 DIAGNOSIS — M79641 Pain in right hand: Secondary | ICD-10-CM | POA: Diagnosis not present

## 2020-06-11 DIAGNOSIS — M2550 Pain in unspecified joint: Secondary | ICD-10-CM | POA: Diagnosis not present

## 2020-06-16 ENCOUNTER — Other Ambulatory Visit: Payer: 59

## 2020-06-22 DIAGNOSIS — E039 Hypothyroidism, unspecified: Secondary | ICD-10-CM | POA: Diagnosis not present

## 2020-06-22 DIAGNOSIS — I10 Essential (primary) hypertension: Secondary | ICD-10-CM | POA: Diagnosis not present

## 2020-06-22 DIAGNOSIS — G4700 Insomnia, unspecified: Secondary | ICD-10-CM | POA: Diagnosis not present

## 2020-06-22 DIAGNOSIS — E782 Mixed hyperlipidemia: Secondary | ICD-10-CM | POA: Diagnosis not present

## 2020-06-22 DIAGNOSIS — F419 Anxiety disorder, unspecified: Secondary | ICD-10-CM | POA: Diagnosis not present

## 2020-06-22 DIAGNOSIS — H9313 Tinnitus, bilateral: Secondary | ICD-10-CM | POA: Diagnosis not present

## 2020-06-22 DIAGNOSIS — K219 Gastro-esophageal reflux disease without esophagitis: Secondary | ICD-10-CM | POA: Diagnosis not present

## 2020-06-22 DIAGNOSIS — E1142 Type 2 diabetes mellitus with diabetic polyneuropathy: Secondary | ICD-10-CM | POA: Diagnosis not present

## 2020-06-22 DIAGNOSIS — F324 Major depressive disorder, single episode, in partial remission: Secondary | ICD-10-CM | POA: Diagnosis not present

## 2020-06-23 ENCOUNTER — Ambulatory Visit: Payer: 59 | Admitting: Podiatry

## 2020-06-30 ENCOUNTER — Other Ambulatory Visit: Payer: Self-pay

## 2020-06-30 ENCOUNTER — Ambulatory Visit
Admission: RE | Admit: 2020-06-30 | Discharge: 2020-06-30 | Disposition: A | Discharge status: Home / Self Care | Payer: 59 | Source: Ambulatory Visit | Attending: Endocrinology | Admitting: Endocrinology

## 2020-06-30 DIAGNOSIS — I739 Peripheral vascular disease, unspecified: Secondary | ICD-10-CM

## 2020-06-30 DIAGNOSIS — E042 Nontoxic multinodular goiter: Secondary | ICD-10-CM | POA: Diagnosis not present

## 2020-06-30 DIAGNOSIS — E049 Nontoxic goiter, unspecified: Secondary | ICD-10-CM

## 2020-07-03 ENCOUNTER — Encounter: Payer: 59 | Admitting: Vascular Surgery

## 2020-07-03 ENCOUNTER — Encounter (HOSPITAL_COMMUNITY): Payer: 59

## 2020-07-07 ENCOUNTER — Ambulatory Visit: Payer: 59 | Admitting: Podiatry

## 2020-07-07 DIAGNOSIS — M9903 Segmental and somatic dysfunction of lumbar region: Secondary | ICD-10-CM | POA: Diagnosis not present

## 2020-07-07 DIAGNOSIS — M5432 Sciatica, left side: Secondary | ICD-10-CM | POA: Diagnosis not present

## 2020-07-10 DIAGNOSIS — H40013 Open angle with borderline findings, low risk, bilateral: Secondary | ICD-10-CM | POA: Diagnosis not present

## 2020-07-10 DIAGNOSIS — H04123 Dry eye syndrome of bilateral lacrimal glands: Secondary | ICD-10-CM | POA: Diagnosis not present

## 2020-07-10 DIAGNOSIS — H524 Presbyopia: Secondary | ICD-10-CM | POA: Diagnosis not present

## 2020-07-10 DIAGNOSIS — H40033 Anatomical narrow angle, bilateral: Secondary | ICD-10-CM | POA: Diagnosis not present

## 2020-07-10 DIAGNOSIS — H35033 Hypertensive retinopathy, bilateral: Secondary | ICD-10-CM | POA: Diagnosis not present

## 2020-07-16 ENCOUNTER — Ambulatory Visit: Payer: 59 | Admitting: Podiatry

## 2020-07-22 ENCOUNTER — Other Ambulatory Visit (HOSPITAL_COMMUNITY): Payer: Self-pay | Admitting: Family Medicine

## 2020-07-23 DIAGNOSIS — H6121 Impacted cerumen, right ear: Secondary | ICD-10-CM | POA: Diagnosis not present

## 2020-07-23 DIAGNOSIS — H9313 Tinnitus, bilateral: Secondary | ICD-10-CM | POA: Diagnosis not present

## 2020-07-23 DIAGNOSIS — H903 Sensorineural hearing loss, bilateral: Secondary | ICD-10-CM | POA: Diagnosis not present

## 2020-07-24 ENCOUNTER — Other Ambulatory Visit (HOSPITAL_COMMUNITY): Payer: Self-pay | Admitting: Physician Assistant

## 2020-07-29 DIAGNOSIS — M5432 Sciatica, left side: Secondary | ICD-10-CM | POA: Diagnosis not present

## 2020-07-29 DIAGNOSIS — M9903 Segmental and somatic dysfunction of lumbar region: Secondary | ICD-10-CM | POA: Diagnosis not present

## 2020-08-12 ENCOUNTER — Ambulatory Visit: Payer: 59 | Attending: Internal Medicine

## 2020-08-12 DIAGNOSIS — Z23 Encounter for immunization: Secondary | ICD-10-CM

## 2020-08-12 NOTE — Progress Notes (Signed)
° °  Covid-19 Vaccination Clinic  Name:  Amy Adams    MRN: [id number] DOB: 1965/12/15  08/12/2020  [name] was observed post Covid-19 immunization for 15 minutes without incident. She was provided with Vaccine Information Sheet and instruction to access the V-Safe system.   [name] was instructed to call 911 with any severe reactions post vaccine:  Difficulty breathing   Swelling of face and throat   A fast heartbeat   A bad rash all over body   Dizziness and weakness

## 2020-08-14 ENCOUNTER — Other Ambulatory Visit (HOSPITAL_COMMUNITY): Payer: Self-pay | Admitting: Internal Medicine

## 2020-08-21 ENCOUNTER — Other Ambulatory Visit: Payer: Self-pay

## 2020-08-21 ENCOUNTER — Encounter: Payer: Self-pay | Admitting: Vascular Surgery

## 2020-08-21 ENCOUNTER — Ambulatory Visit: Payer: 59 | Admitting: Vascular Surgery

## 2020-08-21 ENCOUNTER — Ambulatory Visit (HOSPITAL_COMMUNITY)
Admission: RE | Admit: 2020-08-21 | Discharge: 2020-08-21 | Disposition: A | Discharge status: Home / Self Care | Payer: 59 | Source: Ambulatory Visit | Attending: Vascular Surgery | Admitting: Vascular Surgery

## 2020-08-21 VITALS — BP 144/93 | HR 84 | Temp 98.2°F | Resp 20 | Ht 69.0 in | Wt 196.9 lb

## 2020-08-21 DIAGNOSIS — I739 Peripheral vascular disease, unspecified: Secondary | ICD-10-CM

## 2020-08-21 NOTE — Progress Notes (Signed)
Patient ID: Amy Adams, female   DOB: 1966-08-22, 54 y.o.   MRN: [id number]  Reason for Consult: New Patient (Initial Visit)   Referred by Shirline Frees, MD  Subjective:     HPI:  Amy Adams is a 54 y.o. female history of diabetes, hyperlipidemia, hypertension.  She does have peripheral neuropathy.  She has bilateral lower extremity pain from the knees radiating down.  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  She does walk 4 times daily.  She does this during work hours.  Blood sugar is better controlled now.  She does not have any tissue loss or ulceration.  Denies frank claudication.  She does not have a history of stroke TIA or amaurosis.  Past Medical History:  Diagnosis Date  . Anxiety   . Arthritis   . Chronic foot pain, left   . GERD (gastroesophageal reflux disease)   . History of anal fissures   . Hyperlipidemia   . Hypertension   . Hypothyroidism   . Insulin dependent type 2 diabetes mellitus [facility])    endrocrinologist-- dr Chalmers Cater  . Migraines   . Peripheral neuropathy   . RA (rheumatoid arthritis) [facility])    rheumatologist-  dr Amil Amen  . SUI (stress urinary incontinence, female)   . Wears glasses    Family History  Problem Relation Age of Onset  . Diabetes Mother   . Migraines Mother   . COPD Father   . Cancer Father   . Diabetes Sister    Past Surgical History:  Procedure Laterality Date  . ABDOMINAL HYSTERECTOMY  1990   with Right Salpingo--ophorectomy  . ANAL SPHINCTEROTOMY  01-05-2006    dr Rise Patience @[facility]   . BUNIONECTOMY  09-25-2009   dr Milinda Pointer @[facility]    right great toe  . CARPAL TUNNEL RELEASE Bilateral 1996;  1997  . CESAREAN SECTION  1988  . FOOT SURGERY  12/ 2014   dr Milinda Pointer   left 2nd hammertoe repair,  left 2nd metatarsal matrixectomy, left heel endoscopic plantar fasiotomy  . IR CATHETER TUBE CHANGE  01/01/2019  . IR RADIOLOGIST EVAL & MGMT  11/28/2018  . IR RADIOLOGIST EVAL & MGMT  11/29/2018  . IR RADIOLOGIST  EVAL & MGMT  12/06/2018  . IR RADIOLOGIST EVAL & MGMT  12/25/2018  . IR RADIOLOGIST EVAL & MGMT  01/08/2019  . IR RADIOLOGIST EVAL & MGMT  01/29/2019  . PANNICULECTOMY N/A 10/26/2018   Procedure: PANNICULECTOMY;  Surgeon: Johnathan Hausen, MD;  Location: [facility];  Service: General;  Laterality: N/A;  . TENDON REPAIR  09/2015   left posterior tibial and peroneal tendon repairs  . TOE AMPUTATION  01/2015   right 2nd toe  . TONSILLECTOMY  age 89  . TRANSOBTURATOR SLING  01-08-2004   dr Jeffie Pollock @[facility]     Short Social History:  Social History   Tobacco Use  . Smoking status: Never Smoker  . Smokeless tobacco: Never Used  Substance Use Topics  . Alcohol use: No    Allergies  Allergen Reactions  . Dilaudid [Hydromorphone Hcl] Nausea And Vomiting    Hard to wake up  . Doxycycline Nausea And Vomiting  . Topiramate Other (See Comments)    Suicidal thoughts per patient  . Trazodone And Nefazodone Other (See Comments)    Hallucinations/ suicidal thoughts    Current Outpatient Medications  Medication Sig Dispense Refill  . acetaminophen (TYLENOL) 500 MG tablet Take 500 mg by mouth every 6 (six) hours  as needed for fever.    Marland Kitchen albuterol (PROVENTIL HFA;VENTOLIN HFA) 108 (90 BASE) MCG/ACT inhaler Inhale 1-2 puffs into the lungs every 6 (six) hours as needed for wheezing or shortness of breath.    . ALPRAZolam (XANAX) 1 MG tablet Take 1 mg by mouth 3 (three) times daily as needed for anxiety.     Marland Kitchen atorvastatin (LIPITOR) 40 MG tablet     . Continuous Blood Gluc Sensor (FREESTYLE LIBRE 14 DAY SENSOR) MISC USE AS DIRECTED EVERY 2 WEEKS SUBCUTANEOUSLY  4  . cyclobenzaprine (FLEXERIL) 10 MG tablet Take 1 tablet (10 mg total) by mouth 3 (three) times daily. 270 tablet 3  . diclofenac (VOLTAREN) 75 MG EC tablet Take 75 mg by mouth daily as needed.     . diclofenac Sodium (VOLTAREN) 1 % GEL diclofenac 1 % topical gel    . DULoxetine (CYMBALTA) 60 MG capsule Take 120 mg by mouth at bedtime.    Marland Kitchen  estradiol (ESTRACE) 0.5 MG tablet Take 0.5 mg by mouth every evening.     . fenofibrate micronized (LOFIBRA) 134 MG capsule Take 134 mg by mouth every evening.     . fluconazole (DIFLUCAN) 150 MG tablet fluconazole 150 mg tablet    . folic acid (FOLVITE) 1 MG tablet Take 2 mg by mouth daily.     Marland Kitchen gabapentin (NEURONTIN) 400 MG capsule TAKE 1 CAPSULE BY MOUTH 3 TIMES DAILY. (Patient taking differently: Take 400 mg by mouth 3 (three) times daily. ) 270 capsule 3  . hydrochlorothiazide (HYDRODIURIL) 25 MG tablet Take 25 mg by mouth daily.    . hydroxychloroquine (PLAQUENIL) 200 MG tablet Take 400 mg by mouth at bedtime.     . insulin glargine (LANTUS SOLOSTAR) 100 UNIT/ML injection Inject 25 Units into the skin 2 (two) times daily. Per [place]    . insulin lispro (HUMALOG KWIKPEN) 100 UNIT/ML injection Inject 6-15 Units into the skin 3 (three) times daily with meals. 6-15 units sliding scale AS NEEDED    . levocetirizine (XYZAL) 5 MG tablet Take 5 mg by mouth every evening.    Marland Kitchen levothyroxine (SYNTHROID, LEVOTHROID) 50 MCG tablet Take 50 mcg by mouth daily before breakfast.     . lidocaine (LIDODERM) 5 %     . metFORMIN (GLUCOPHAGE-XR) 500 MG 24 hr tablet Take 1,000 mg by mouth at bedtime.     . methotrexate 25 MG/ML SOLN Inject 20 mg into the muscle every Thursday. Inject 0.8 mL once weekly at bedtime. Every Thursday    . montelukast (SINGULAIR) 10 MG tablet Take 10 mg by mouth at bedtime.    Marland Kitchen omeprazole (PRILOSEC) 40 MG capsule Take 40 mg by mouth every evening.     . polyethylene glycol-electrolytes (NULYTELY) 420 g solution peg-electrolyte solution 420 gram oral solution    . predniSONE (DELTASONE) 5 MG tablet prednisone 5 mg tablet    . traMADol (ULTRAM) 50 MG tablet Take 1 tablet (50 mg total) by mouth every 8 (eight) hours as needed. (Patient taking differently: Take 50 mg by mouth every 6 (six) hours as needed (pain). ) 30 tablet 0  . UltiCare Alcohol Swabs 70 % PADS     . UNIFINE PENTIPS 31G X  5 MM MISC USE AS DIRECTED 5 TIMES PER DAY  4  . valACYclovir (VALTREX) 1000 MG tablet Take 1,000 mg by mouth 2 (two) times daily as needed (fever blisters).   3  . zolpidem (AMBIEN CR) 12.5 MG CR tablet Take 12.5 mg by  mouth at bedtime.     Marland Kitchen losartan (COZAAR) 100 MG tablet Take 100 mg by mouth daily. (Patient not taking: Reported on 08/21/2020)    . valsartan (DIOVAN) 320 MG tablet  (Patient not taking: Reported on 08/21/2020)     No current facility-administered medications for this visit.    Review of Systems  Constitutional:  Constitutional negative. HENT: HENT negative.  Eyes: Eyes negative.  Respiratory: Respiratory negative.  Cardiovascular: Cardiovascular negative.  GI: Gastrointestinal negative.  Musculoskeletal: Musculoskeletal negative.  Skin: Skin negative.  Neurological: Neurological negative. Hematologic: Hematologic/lymphatic negative.  Psychiatric: Psychiatric negative.        Objective:  Objective   Vitals:   08/21/20 1123  BP: (!) 144/93  Pulse: 84  Resp: 20  Temp: 98.2 F (36.8 C)  SpO2: 95%  Weight: 196 lb 14.4 oz (89.3 kg)  Height: 5\' 9"  (1.753 m)   Body mass index is 29.08 kg/m.  Physical Exam HENT:     Head: Normocephalic.     Nose:     Comments: Wearing a mask Eyes:     Pupils: Pupils are equal, round, and reactive to light.  Cardiovascular:     Rate and Rhythm: Normal rate and regular rhythm.     Pulses: Normal pulses.  Pulmonary:     Effort: Pulmonary effort is normal.  Abdominal:     General: Abdomen is flat.     Palpations: Abdomen is soft.  Musculoskeletal:        General: Normal range of motion.     Cervical back: Normal range of motion and neck supple.  Skin:    General: Skin is warm and dry.     Capillary Refill: Capillary refill takes 2 to 3 seconds.  Neurological:     General: No focal deficit present.     Mental Status: She is alert.  Psychiatric:        Mood and Affect: Mood normal.        Behavior: Behavior normal.         Thought Content: Thought content normal.        Judgment: Judgment normal.     Data: ABI Findings:  +---------+------------------+-----+---------+--------+  Right  Rt Pressure (mmHg)IndexWaveform Comment   +---------+------------------+-----+---------+--------+  Brachial 171                      +---------+------------------+-----+---------+--------+  ATA   204        1.13 triphasic      +---------+------------------+-----+---------+--------+  PTA   208        1.16 triphasic      +---------+------------------+-----+---------+--------+  Great Toe172        0.96 Normal        +---------+------------------+-----+---------+--------+   +---------+------------------+-----+---------+-------+  Left   Lt Pressure (mmHg)IndexWaveform Comment  +---------+------------------+-----+---------+-------+  Brachial 180                      +---------+------------------+-----+---------+-------+  ATA   209        1.16 triphasic      +---------+------------------+-----+---------+-------+  PTA   213        1.18 triphasic      +---------+------------------+-----+---------+-------+  Daine Gip        0.79 Normal        +---------+------------------+-----+---------+-------+      Assessment/Plan:     54 year old female with history of diabetes hyperlipidemia and hypertension.  She does have a history of a second toe amputation.  Bilateral lower extremity  pain does not appear to be vascular in nature.  She does have some delayed capillary refill on the left.  Given her risk factors I have asked her to initiate baby aspirin therapy daily.  She does not need any vascular invention at this time.  I discussed with her protecting her feet given likely microvascular disease.  She can follow-up with me on an as-needed basis.      Waynetta Sandy MD [facility]

## 2020-08-27 ENCOUNTER — Ambulatory Visit: Payer: 59 | Admitting: Podiatry

## 2020-08-27 ENCOUNTER — Encounter: Payer: Self-pay | Admitting: Podiatry

## 2020-08-27 ENCOUNTER — Other Ambulatory Visit: Payer: Self-pay

## 2020-08-27 DIAGNOSIS — E1142 Type 2 diabetes mellitus with diabetic polyneuropathy: Secondary | ICD-10-CM

## 2020-08-27 DIAGNOSIS — M2042 Other hammer toe(s) (acquired), left foot: Secondary | ICD-10-CM | POA: Diagnosis not present

## 2020-08-27 NOTE — Progress Notes (Signed)
She presents today for diabetic foot exam.  States that that third toe still is painful.  States that she saw the vascular surgeon who told her that her blood flow in her toes is horrible.  Objective: Vital signs are stable she is alert oriented x3.  Pulses are palpable.  She still has good capillary fill time to toe some 345 left and hallux left.  No reproducible pain at this point.  Assessment: Diabetes mellitus with peripheral vascular disease diabetic peripheral neuropathy.  Plan: Follow-up with me on an as needed basis or sooner for a tenotomy to the third toe left foot

## 2020-09-01 DIAGNOSIS — M150 Primary generalized (osteo)arthritis: Secondary | ICD-10-CM | POA: Diagnosis not present

## 2020-09-01 DIAGNOSIS — Z79899 Other long term (current) drug therapy: Secondary | ICD-10-CM | POA: Diagnosis not present

## 2020-09-01 DIAGNOSIS — E663 Overweight: Secondary | ICD-10-CM | POA: Diagnosis not present

## 2020-09-01 DIAGNOSIS — L401 Generalized pustular psoriasis: Secondary | ICD-10-CM | POA: Diagnosis not present

## 2020-09-01 DIAGNOSIS — M2550 Pain in unspecified joint: Secondary | ICD-10-CM | POA: Diagnosis not present

## 2020-09-01 DIAGNOSIS — Z6829 Body mass index (BMI) 29.0-29.9, adult: Secondary | ICD-10-CM | POA: Diagnosis not present

## 2020-09-01 DIAGNOSIS — M5136 Other intervertebral disc degeneration, lumbar region: Secondary | ICD-10-CM | POA: Diagnosis not present

## 2020-09-01 DIAGNOSIS — M0589 Other rheumatoid arthritis with rheumatoid factor of multiple sites: Secondary | ICD-10-CM | POA: Diagnosis not present

## 2020-09-02 DIAGNOSIS — M9903 Segmental and somatic dysfunction of lumbar region: Secondary | ICD-10-CM | POA: Diagnosis not present

## 2020-09-02 DIAGNOSIS — M5432 Sciatica, left side: Secondary | ICD-10-CM | POA: Diagnosis not present

## 2020-09-09 DIAGNOSIS — Z1231 Encounter for screening mammogram for malignant neoplasm of breast: Secondary | ICD-10-CM | POA: Diagnosis not present

## 2020-09-09 DIAGNOSIS — Z6830 Body mass index (BMI) 30.0-30.9, adult: Secondary | ICD-10-CM | POA: Diagnosis not present

## 2020-09-09 DIAGNOSIS — Z01419 Encounter for gynecological examination (general) (routine) without abnormal findings: Secondary | ICD-10-CM | POA: Diagnosis not present

## 2020-09-30 DIAGNOSIS — M9903 Segmental and somatic dysfunction of lumbar region: Secondary | ICD-10-CM | POA: Diagnosis not present

## 2020-09-30 DIAGNOSIS — M5432 Sciatica, left side: Secondary | ICD-10-CM | POA: Diagnosis not present

## 2020-10-15 ENCOUNTER — Ambulatory Visit: Payer: 59 | Admitting: Podiatry

## 2020-10-20 ENCOUNTER — Ambulatory Visit: Payer: 59 | Admitting: Podiatry

## 2020-10-21 ENCOUNTER — Other Ambulatory Visit (HOSPITAL_COMMUNITY): Payer: Self-pay | Admitting: Family Medicine

## 2020-10-23 ENCOUNTER — Other Ambulatory Visit (HOSPITAL_COMMUNITY): Payer: Self-pay | Admitting: Physician Assistant

## 2020-10-28 DIAGNOSIS — M9903 Segmental and somatic dysfunction of lumbar region: Secondary | ICD-10-CM | POA: Diagnosis not present

## 2020-10-28 DIAGNOSIS — M5432 Sciatica, left side: Secondary | ICD-10-CM | POA: Diagnosis not present

## 2020-11-18 DIAGNOSIS — M5432 Sciatica, left side: Secondary | ICD-10-CM | POA: Diagnosis not present

## 2020-11-18 DIAGNOSIS — M9903 Segmental and somatic dysfunction of lumbar region: Secondary | ICD-10-CM | POA: Diagnosis not present

## 2020-11-19 DIAGNOSIS — E1165 Type 2 diabetes mellitus with hyperglycemia: Secondary | ICD-10-CM | POA: Diagnosis not present

## 2020-11-19 DIAGNOSIS — E039 Hypothyroidism, unspecified: Secondary | ICD-10-CM | POA: Diagnosis not present

## 2020-11-19 DIAGNOSIS — E7800 Pure hypercholesterolemia, unspecified: Secondary | ICD-10-CM | POA: Diagnosis not present

## 2020-11-26 ENCOUNTER — Other Ambulatory Visit (HOSPITAL_COMMUNITY): Payer: Self-pay | Admitting: Endocrinology

## 2020-11-26 DIAGNOSIS — I10 Essential (primary) hypertension: Secondary | ICD-10-CM | POA: Diagnosis not present

## 2020-11-26 DIAGNOSIS — E1165 Type 2 diabetes mellitus with hyperglycemia: Secondary | ICD-10-CM | POA: Diagnosis not present

## 2020-11-26 DIAGNOSIS — E049 Nontoxic goiter, unspecified: Secondary | ICD-10-CM | POA: Diagnosis not present

## 2020-11-26 DIAGNOSIS — E039 Hypothyroidism, unspecified: Secondary | ICD-10-CM | POA: Diagnosis not present

## 2020-11-26 DIAGNOSIS — M069 Rheumatoid arthritis, unspecified: Secondary | ICD-10-CM | POA: Diagnosis not present

## 2020-11-26 DIAGNOSIS — E7800 Pure hypercholesterolemia, unspecified: Secondary | ICD-10-CM | POA: Diagnosis not present

## 2020-11-26 DIAGNOSIS — G609 Hereditary and idiopathic neuropathy, unspecified: Secondary | ICD-10-CM | POA: Diagnosis not present

## 2020-12-02 ENCOUNTER — Other Ambulatory Visit: Payer: Self-pay | Admitting: Endocrinology

## 2020-12-02 DIAGNOSIS — E049 Nontoxic goiter, unspecified: Secondary | ICD-10-CM

## 2020-12-07 ENCOUNTER — Other Ambulatory Visit (HOSPITAL_COMMUNITY): Payer: Self-pay | Admitting: Physician Assistant

## 2020-12-07 DIAGNOSIS — Z79899 Other long term (current) drug therapy: Secondary | ICD-10-CM | POA: Diagnosis not present

## 2020-12-07 DIAGNOSIS — Z6829 Body mass index (BMI) 29.0-29.9, adult: Secondary | ICD-10-CM | POA: Diagnosis not present

## 2020-12-07 DIAGNOSIS — M0589 Other rheumatoid arthritis with rheumatoid factor of multiple sites: Secondary | ICD-10-CM | POA: Diagnosis not present

## 2020-12-07 DIAGNOSIS — M150 Primary generalized (osteo)arthritis: Secondary | ICD-10-CM | POA: Diagnosis not present

## 2020-12-07 DIAGNOSIS — M5136 Other intervertebral disc degeneration, lumbar region: Secondary | ICD-10-CM | POA: Diagnosis not present

## 2020-12-07 DIAGNOSIS — L401 Generalized pustular psoriasis: Secondary | ICD-10-CM | POA: Diagnosis not present

## 2020-12-07 DIAGNOSIS — E663 Overweight: Secondary | ICD-10-CM | POA: Diagnosis not present

## 2020-12-07 DIAGNOSIS — M2550 Pain in unspecified joint: Secondary | ICD-10-CM | POA: Diagnosis not present

## 2020-12-09 DIAGNOSIS — M5432 Sciatica, left side: Secondary | ICD-10-CM | POA: Diagnosis not present

## 2020-12-09 DIAGNOSIS — M9903 Segmental and somatic dysfunction of lumbar region: Secondary | ICD-10-CM | POA: Diagnosis not present

## 2020-12-17 ENCOUNTER — Other Ambulatory Visit (HOSPITAL_COMMUNITY): Payer: Self-pay | Admitting: Endocrinology

## 2020-12-22 ENCOUNTER — Other Ambulatory Visit: Payer: 59

## 2020-12-24 DIAGNOSIS — E782 Mixed hyperlipidemia: Secondary | ICD-10-CM | POA: Diagnosis not present

## 2020-12-24 DIAGNOSIS — E039 Hypothyroidism, unspecified: Secondary | ICD-10-CM | POA: Diagnosis not present

## 2020-12-24 DIAGNOSIS — I10 Essential (primary) hypertension: Secondary | ICD-10-CM | POA: Diagnosis not present

## 2020-12-24 DIAGNOSIS — E1142 Type 2 diabetes mellitus with diabetic polyneuropathy: Secondary | ICD-10-CM | POA: Diagnosis not present

## 2020-12-24 DIAGNOSIS — F419 Anxiety disorder, unspecified: Secondary | ICD-10-CM | POA: Diagnosis not present

## 2020-12-24 DIAGNOSIS — H9313 Tinnitus, bilateral: Secondary | ICD-10-CM | POA: Diagnosis not present

## 2020-12-24 DIAGNOSIS — F324 Major depressive disorder, single episode, in partial remission: Secondary | ICD-10-CM | POA: Diagnosis not present

## 2020-12-24 DIAGNOSIS — G4700 Insomnia, unspecified: Secondary | ICD-10-CM | POA: Diagnosis not present

## 2020-12-24 DIAGNOSIS — M069 Rheumatoid arthritis, unspecified: Secondary | ICD-10-CM | POA: Diagnosis not present

## 2021-01-06 DIAGNOSIS — M5432 Sciatica, left side: Secondary | ICD-10-CM | POA: Diagnosis not present

## 2021-01-06 DIAGNOSIS — M9903 Segmental and somatic dysfunction of lumbar region: Secondary | ICD-10-CM | POA: Diagnosis not present

## 2021-01-07 ENCOUNTER — Other Ambulatory Visit: Payer: Self-pay | Admitting: Podiatry

## 2021-01-07 NOTE — Telephone Encounter (Signed)
Please advise 

## 2021-01-08 ENCOUNTER — Other Ambulatory Visit (HOSPITAL_COMMUNITY): Payer: Self-pay | Admitting: Physician Assistant

## 2021-01-08 DIAGNOSIS — M069 Rheumatoid arthritis, unspecified: Secondary | ICD-10-CM | POA: Diagnosis not present

## 2021-01-08 DIAGNOSIS — H04123 Dry eye syndrome of bilateral lacrimal glands: Secondary | ICD-10-CM | POA: Diagnosis not present

## 2021-01-08 DIAGNOSIS — Z79899 Other long term (current) drug therapy: Secondary | ICD-10-CM | POA: Diagnosis not present

## 2021-01-12 DIAGNOSIS — M5432 Sciatica, left side: Secondary | ICD-10-CM | POA: Diagnosis not present

## 2021-01-12 DIAGNOSIS — M9903 Segmental and somatic dysfunction of lumbar region: Secondary | ICD-10-CM | POA: Diagnosis not present

## 2021-01-19 ENCOUNTER — Other Ambulatory Visit (HOSPITAL_COMMUNITY): Payer: Self-pay | Admitting: Physician Assistant

## 2021-01-22 ENCOUNTER — Other Ambulatory Visit (HOSPITAL_COMMUNITY): Payer: Self-pay | Admitting: Family Medicine

## 2021-01-25 ENCOUNTER — Other Ambulatory Visit (HOSPITAL_COMMUNITY): Payer: Self-pay | Admitting: Family Medicine

## 2021-02-01 ENCOUNTER — Other Ambulatory Visit (HOSPITAL_COMMUNITY): Payer: Self-pay | Admitting: Dentistry

## 2021-02-03 DIAGNOSIS — M5432 Sciatica, left side: Secondary | ICD-10-CM | POA: Diagnosis not present

## 2021-02-03 DIAGNOSIS — M9903 Segmental and somatic dysfunction of lumbar region: Secondary | ICD-10-CM | POA: Diagnosis not present

## 2021-02-24 ENCOUNTER — Other Ambulatory Visit (HOSPITAL_COMMUNITY): Payer: Self-pay

## 2021-02-25 ENCOUNTER — Other Ambulatory Visit (HOSPITAL_COMMUNITY): Payer: Self-pay

## 2021-02-25 MED ORDER — VALACYCLOVIR HCL 1 G PO TABS
ORAL_TABLET | ORAL | 3 refills | Status: DC
  Filled 2021-02-25 – 2021-12-15 (×2): qty 12, 3d supply, fill #0

## 2021-03-08 ENCOUNTER — Other Ambulatory Visit (HOSPITAL_COMMUNITY): Payer: Self-pay

## 2021-03-08 DIAGNOSIS — Z6828 Body mass index (BMI) 28.0-28.9, adult: Secondary | ICD-10-CM | POA: Diagnosis not present

## 2021-03-08 DIAGNOSIS — L401 Generalized pustular psoriasis: Secondary | ICD-10-CM | POA: Diagnosis not present

## 2021-03-08 DIAGNOSIS — Z79899 Other long term (current) drug therapy: Secondary | ICD-10-CM | POA: Diagnosis not present

## 2021-03-08 DIAGNOSIS — M2550 Pain in unspecified joint: Secondary | ICD-10-CM | POA: Diagnosis not present

## 2021-03-08 DIAGNOSIS — M5136 Other intervertebral disc degeneration, lumbar region: Secondary | ICD-10-CM | POA: Diagnosis not present

## 2021-03-08 DIAGNOSIS — M150 Primary generalized (osteo)arthritis: Secondary | ICD-10-CM | POA: Diagnosis not present

## 2021-03-08 DIAGNOSIS — E663 Overweight: Secondary | ICD-10-CM | POA: Diagnosis not present

## 2021-03-08 DIAGNOSIS — M0589 Other rheumatoid arthritis with rheumatoid factor of multiple sites: Secondary | ICD-10-CM | POA: Diagnosis not present

## 2021-03-08 MED ORDER — TRAMADOL HCL 50 MG PO TABS
ORAL_TABLET | ORAL | 0 refills | Status: DC
  Filled 2021-03-15: qty 120, 30d supply, fill #0

## 2021-03-08 MED ORDER — METHOTREXATE SODIUM CHEMO INJECTION 50 MG/2ML: INTRAMUSCULAR | 1 refills | Status: DC

## 2021-03-08 MED ORDER — HYDROXYCHLOROQUINE SULFATE 200 MG PO TABS: ORAL_TABLET | ORAL | 3 refills | Status: DC

## 2021-03-15 ENCOUNTER — Other Ambulatory Visit (HOSPITAL_COMMUNITY): Payer: Self-pay

## 2021-03-15 MED ORDER — ALBUTEROL SULFATE HFA 108 (90 BASE) MCG/ACT IN AERS
INHALATION_SPRAY | RESPIRATORY_TRACT | 0 refills | Status: DC
Start: 2020-07-23 — End: 2021-08-12
  Filled 2021-03-15: qty 54, 75d supply, fill #0

## 2021-03-17 DIAGNOSIS — M5432 Sciatica, left side: Secondary | ICD-10-CM | POA: Diagnosis not present

## 2021-03-17 DIAGNOSIS — M9903 Segmental and somatic dysfunction of lumbar region: Secondary | ICD-10-CM | POA: Diagnosis not present

## 2021-03-29 ENCOUNTER — Other Ambulatory Visit (HOSPITAL_COMMUNITY): Payer: Self-pay

## 2021-03-30 DIAGNOSIS — E039 Hypothyroidism, unspecified: Secondary | ICD-10-CM | POA: Diagnosis not present

## 2021-04-13 ENCOUNTER — Other Ambulatory Visit (HOSPITAL_COMMUNITY): Payer: Self-pay

## 2021-04-13 MED FILL — Montelukast Sodium Tab 10 MG (Base Equiv): ORAL | 90 days supply | Qty: 90 | Fill #0 | Status: AC

## 2021-04-13 MED FILL — Gabapentin Cap 400 MG: ORAL | 90 days supply | Qty: 270 | Fill #0 | Status: AC

## 2021-04-13 MED FILL — Fenofibrate Micronized Cap 134 MG: ORAL | 90 days supply | Qty: 90 | Fill #0 | Status: AC

## 2021-04-13 MED FILL — Alprazolam Tab 1 MG: ORAL | 90 days supply | Qty: 270 | Fill #0 | Status: AC

## 2021-04-13 MED FILL — Cyclobenzaprine HCl Tab 10 MG: ORAL | 90 days supply | Qty: 270 | Fill #0 | Status: AC

## 2021-04-13 MED FILL — Hydroxychloroquine Sulfate Tab 200 MG: ORAL | 90 days supply | Qty: 180 | Fill #0 | Status: AC

## 2021-04-13 MED FILL — Duloxetine HCl Enteric Coated Pellets Cap 60 MG (Base Eq): ORAL | 90 days supply | Qty: 180 | Fill #0 | Status: AC

## 2021-04-13 MED FILL — Levothyroxine Sodium Tab 50 MCG: ORAL | 90 days supply | Qty: 90 | Fill #0 | Status: AC

## 2021-04-13 MED FILL — Metformin HCl Tab ER 24HR 500 MG: ORAL | 90 days supply | Qty: 360 | Fill #0 | Status: AC

## 2021-04-13 MED FILL — Levocetirizine Dihydrochloride Tab 5 MG: ORAL | 90 days supply | Qty: 90 | Fill #0 | Status: AC

## 2021-04-14 ENCOUNTER — Other Ambulatory Visit (HOSPITAL_COMMUNITY): Payer: Self-pay

## 2021-04-14 DIAGNOSIS — M069 Rheumatoid arthritis, unspecified: Secondary | ICD-10-CM | POA: Diagnosis not present

## 2021-04-14 DIAGNOSIS — E049 Nontoxic goiter, unspecified: Secondary | ICD-10-CM | POA: Diagnosis not present

## 2021-04-14 DIAGNOSIS — E7800 Pure hypercholesterolemia, unspecified: Secondary | ICD-10-CM | POA: Diagnosis not present

## 2021-04-14 DIAGNOSIS — M9903 Segmental and somatic dysfunction of lumbar region: Secondary | ICD-10-CM | POA: Diagnosis not present

## 2021-04-14 DIAGNOSIS — G609 Hereditary and idiopathic neuropathy, unspecified: Secondary | ICD-10-CM | POA: Diagnosis not present

## 2021-04-14 DIAGNOSIS — E039 Hypothyroidism, unspecified: Secondary | ICD-10-CM | POA: Diagnosis not present

## 2021-04-14 DIAGNOSIS — M5432 Sciatica, left side: Secondary | ICD-10-CM | POA: Diagnosis not present

## 2021-04-14 DIAGNOSIS — I10 Essential (primary) hypertension: Secondary | ICD-10-CM | POA: Diagnosis not present

## 2021-04-14 DIAGNOSIS — E1165 Type 2 diabetes mellitus with hyperglycemia: Secondary | ICD-10-CM | POA: Diagnosis not present

## 2021-04-14 MED ORDER — INSULIN GLARGINE-YFGN 100 UNIT/ML ~~LOC~~ SOPN
PEN_INJECTOR | SUBCUTANEOUS | 4 refills | Status: DC
  Filled 2021-04-14 (×2): qty 15, 50d supply, fill #0
  Filled 2021-04-28: qty 27, 90d supply, fill #0
  Filled 2021-11-03: qty 27, 90d supply, fill #1

## 2021-04-15 ENCOUNTER — Other Ambulatory Visit (HOSPITAL_COMMUNITY): Payer: Self-pay

## 2021-04-15 ENCOUNTER — Ambulatory Visit: Payer: 59 | Admitting: Podiatry

## 2021-04-15 ENCOUNTER — Other Ambulatory Visit: Payer: Self-pay

## 2021-04-15 DIAGNOSIS — L03032 Cellulitis of left toe: Secondary | ICD-10-CM

## 2021-04-15 MED ORDER — NEOMYCIN-POLYMYXIN-HC 1 % OT SOLN
OTIC | 1 refills | Status: DC
Start: 2021-04-15 — End: 2023-12-07
  Filled 2021-04-15: qty 10, 30d supply, fill #0

## 2021-04-15 NOTE — Patient Instructions (Signed)

## 2021-04-17 NOTE — Progress Notes (Signed)
She presents today for diabetic checkup states that she has redness in the third toe left foot states that it was swelling and red for 2 to 3 weeks but has stopped about a week ago.  Objective: Vital signs are stable she alert and oriented x3.  There is sharp innervated nail margin along the medial border of the third toe on the left foot.  Neurologic sensorium is diminished muscle strength is normal but with cramping dorsiflexors plantar flexors inverters and everters mild paronychia.  Assessment: No open lesions or wounds left just mild paronychia third toe medial border.  Plan: I&D medial border third toe left foot tolerated procedure well without complications.  Was provided both oral and written home-going structure for the care and soaking of the toe I will follow-up with her in a couple of weeks.

## 2021-04-22 ENCOUNTER — Other Ambulatory Visit (HOSPITAL_COMMUNITY): Payer: Self-pay

## 2021-04-26 ENCOUNTER — Other Ambulatory Visit (HOSPITAL_COMMUNITY): Payer: Self-pay

## 2021-04-27 ENCOUNTER — Other Ambulatory Visit (HOSPITAL_COMMUNITY): Payer: Self-pay

## 2021-04-27 MED ORDER — VALSARTAN 320 MG PO TABS
ORAL_TABLET | ORAL | 0 refills | Status: DC
  Filled 2021-04-27: qty 90, 90d supply, fill #0

## 2021-04-28 ENCOUNTER — Other Ambulatory Visit (HOSPITAL_COMMUNITY): Payer: Self-pay

## 2021-04-29 ENCOUNTER — Ambulatory Visit: Payer: 59 | Admitting: Podiatry

## 2021-04-29 ENCOUNTER — Encounter: Payer: Self-pay | Admitting: Podiatry

## 2021-04-29 ENCOUNTER — Other Ambulatory Visit (HOSPITAL_COMMUNITY): Payer: Self-pay

## 2021-04-29 ENCOUNTER — Other Ambulatory Visit: Payer: Self-pay

## 2021-04-29 DIAGNOSIS — G5792 Unspecified mononeuropathy of left lower limb: Secondary | ICD-10-CM

## 2021-04-29 DIAGNOSIS — M5432 Sciatica, left side: Secondary | ICD-10-CM | POA: Diagnosis not present

## 2021-04-29 DIAGNOSIS — Z9889 Other specified postprocedural states: Secondary | ICD-10-CM

## 2021-04-29 DIAGNOSIS — L03032 Cellulitis of left toe: Secondary | ICD-10-CM

## 2021-04-29 MED ORDER — TRIAMCINOLONE ACETONIDE 40 MG/ML IJ SUSP
20.0000 mg | Freq: Once | INTRAMUSCULAR | Status: AC
  Administered 2021-04-29: 20 mg

## 2021-05-01 NOTE — Progress Notes (Signed)
She presents today for follow-up of her paronychia and nail avulsion tibial border third digit left foot.  States is doing fine but I am getting the spasms again in my arch and write down and here she points to the first intermetatarsal space.  She also states that she has pain that runs the length of her leg and into her back  Objective: Pulses are strong and palpable.  She has no deformities.  No open lesions or wounds.  She does have pain on palpation to the first intermetatarsal space.  Positive straight leg test left consistent with sciatica  Assessment: Deep peroneal nerve neuritis associated with her neuropathy most likely resulting in spasms.  Well-healing nail avulsion.  Probable sciatica may also be resulting in some of her spasms  Plan: Discussed etiology pathology and surgical therapies this point I injected her first and metatarsal space today with 10 mg Kenalog 5 mg Marcaine point of maximal tenderness.  I will follow-up f as needed.  We are going to recommend that she go to neuro surge to be evaluated for sciatica.

## 2021-05-04 ENCOUNTER — Encounter: Payer: Self-pay | Admitting: Podiatry

## 2021-05-06 ENCOUNTER — Telehealth: Payer: Self-pay | Admitting: Podiatry

## 2021-05-06 NOTE — Telephone Encounter (Signed)
Patient called our office today stating she has sent a MyChart message stating she has talked to the neurologist and she needs a MRI of her hip , back and leg. She also states she is in a lot of pain and can not move from where she is at.

## 2021-05-13 DIAGNOSIS — M5432 Sciatica, left side: Secondary | ICD-10-CM | POA: Diagnosis not present

## 2021-05-13 DIAGNOSIS — M9903 Segmental and somatic dysfunction of lumbar region: Secondary | ICD-10-CM | POA: Diagnosis not present

## 2021-05-19 ENCOUNTER — Other Ambulatory Visit (HOSPITAL_COMMUNITY): Payer: Self-pay

## 2021-05-19 MED FILL — Continuous Glucose System Sensor: 28 days supply | Qty: 2 | Fill #0 | Status: AC

## 2021-05-20 ENCOUNTER — Other Ambulatory Visit (HOSPITAL_COMMUNITY): Payer: Self-pay

## 2021-05-24 ENCOUNTER — Other Ambulatory Visit (HOSPITAL_COMMUNITY): Payer: Self-pay

## 2021-05-25 ENCOUNTER — Other Ambulatory Visit (HOSPITAL_COMMUNITY): Payer: Self-pay | Admitting: Neurological Surgery

## 2021-05-25 ENCOUNTER — Other Ambulatory Visit: Payer: Self-pay | Admitting: Neurological Surgery

## 2021-05-25 DIAGNOSIS — M5416 Radiculopathy, lumbar region: Secondary | ICD-10-CM

## 2021-05-25 DIAGNOSIS — M5417 Radiculopathy, lumbosacral region: Secondary | ICD-10-CM | POA: Diagnosis not present

## 2021-05-26 ENCOUNTER — Other Ambulatory Visit: Payer: Self-pay | Admitting: Neurological Surgery

## 2021-05-26 ENCOUNTER — Other Ambulatory Visit (HOSPITAL_COMMUNITY): Payer: Self-pay

## 2021-05-26 DIAGNOSIS — E039 Hypothyroidism, unspecified: Secondary | ICD-10-CM | POA: Diagnosis not present

## 2021-05-26 DIAGNOSIS — E1165 Type 2 diabetes mellitus with hyperglycemia: Secondary | ICD-10-CM | POA: Diagnosis not present

## 2021-05-26 DIAGNOSIS — E7800 Pure hypercholesterolemia, unspecified: Secondary | ICD-10-CM | POA: Diagnosis not present

## 2021-05-26 DIAGNOSIS — M5416 Radiculopathy, lumbar region: Secondary | ICD-10-CM | POA: Diagnosis not present

## 2021-05-26 MED ORDER — PREDNISONE 20 MG PO TABS
ORAL_TABLET | ORAL | 0 refills | Status: DC
  Filled 2021-05-26: qty 12, 6d supply, fill #0

## 2021-05-28 ENCOUNTER — Other Ambulatory Visit (HOSPITAL_COMMUNITY): Payer: Self-pay

## 2021-05-29 ENCOUNTER — Ambulatory Visit
Admission: RE | Admit: 2021-05-29 | Discharge: 2021-05-29 | Disposition: A | Discharge status: Home / Self Care | Payer: 59 | Source: Ambulatory Visit | Attending: Neurological Surgery | Admitting: Neurological Surgery

## 2021-05-29 ENCOUNTER — Other Ambulatory Visit: Payer: Self-pay

## 2021-05-29 DIAGNOSIS — M5416 Radiculopathy, lumbar region: Secondary | ICD-10-CM

## 2021-05-29 DIAGNOSIS — M5137 Other intervertebral disc degeneration, lumbosacral region: Secondary | ICD-10-CM | POA: Diagnosis not present

## 2021-05-31 ENCOUNTER — Ambulatory Visit
Admission: RE | Admit: 2021-05-31 | Discharge: 2021-05-31 | Disposition: A | Discharge status: Home / Self Care | Payer: 59 | Source: Ambulatory Visit | Attending: Endocrinology | Admitting: Endocrinology

## 2021-05-31 DIAGNOSIS — E041 Nontoxic single thyroid nodule: Secondary | ICD-10-CM | POA: Diagnosis not present

## 2021-05-31 DIAGNOSIS — E049 Nontoxic goiter, unspecified: Secondary | ICD-10-CM

## 2021-06-01 DIAGNOSIS — M4316 Spondylolisthesis, lumbar region: Secondary | ICD-10-CM | POA: Diagnosis not present

## 2021-06-01 DIAGNOSIS — I10 Essential (primary) hypertension: Secondary | ICD-10-CM | POA: Diagnosis not present

## 2021-06-01 DIAGNOSIS — M7130 Other bursal cyst, unspecified site: Secondary | ICD-10-CM | POA: Diagnosis not present

## 2021-06-01 DIAGNOSIS — Z6829 Body mass index (BMI) 29.0-29.9, adult: Secondary | ICD-10-CM | POA: Diagnosis not present

## 2021-06-02 ENCOUNTER — Other Ambulatory Visit (HOSPITAL_COMMUNITY): Payer: Self-pay

## 2021-06-02 MED ORDER — METHOCARBAMOL 500 MG PO TABS
500.0000 mg | ORAL_TABLET | Freq: Four times a day (QID) | ORAL | 0 refills | Status: DC
  Filled 2021-06-02: qty 45, 12d supply, fill #0

## 2021-06-02 MED ORDER — TRAMADOL HCL 50 MG PO TABS
50.0000 mg | ORAL_TABLET | Freq: Four times a day (QID) | ORAL | 0 refills | Status: DC | PRN
  Filled 2021-06-02: qty 45, 12d supply, fill #0

## 2021-06-07 ENCOUNTER — Other Ambulatory Visit (HOSPITAL_COMMUNITY): Payer: Self-pay

## 2021-06-08 ENCOUNTER — Other Ambulatory Visit (HOSPITAL_COMMUNITY): Payer: Self-pay

## 2021-06-08 ENCOUNTER — Encounter: Payer: Self-pay | Admitting: Physical Medicine & Rehabilitation

## 2021-06-09 ENCOUNTER — Other Ambulatory Visit (HOSPITAL_COMMUNITY): Payer: Self-pay

## 2021-06-09 MED ORDER — IBUPROFEN 800 MG PO TABS
800.0000 mg | ORAL_TABLET | Freq: Three times a day (TID) | ORAL | 1 refills | Status: DC
  Filled 2021-06-09: qty 90, 30d supply, fill #0

## 2021-06-10 DIAGNOSIS — U071 COVID-19: Secondary | ICD-10-CM | POA: Diagnosis not present

## 2021-06-22 ENCOUNTER — Ambulatory Visit: Payer: 59 | Admitting: Podiatry

## 2021-06-28 ENCOUNTER — Ambulatory Visit
Admission: RE | Admit: 2021-06-28 | Discharge: 2021-06-28 | Disposition: A | Discharge status: Home / Self Care | Payer: 59 | Source: Ambulatory Visit | Attending: Family Medicine | Admitting: Family Medicine

## 2021-06-28 ENCOUNTER — Other Ambulatory Visit: Payer: Self-pay | Admitting: Family Medicine

## 2021-06-28 DIAGNOSIS — R059 Cough, unspecified: Secondary | ICD-10-CM | POA: Diagnosis not present

## 2021-06-28 DIAGNOSIS — R052 Subacute cough: Secondary | ICD-10-CM

## 2021-07-05 ENCOUNTER — Other Ambulatory Visit (HOSPITAL_COMMUNITY): Payer: Self-pay

## 2021-07-05 DIAGNOSIS — I10 Essential (primary) hypertension: Secondary | ICD-10-CM | POA: Diagnosis not present

## 2021-07-05 DIAGNOSIS — E039 Hypothyroidism, unspecified: Secondary | ICD-10-CM | POA: Diagnosis not present

## 2021-07-05 DIAGNOSIS — U071 COVID-19: Secondary | ICD-10-CM | POA: Diagnosis not present

## 2021-07-05 DIAGNOSIS — F419 Anxiety disorder, unspecified: Secondary | ICD-10-CM | POA: Diagnosis not present

## 2021-07-05 DIAGNOSIS — F324 Major depressive disorder, single episode, in partial remission: Secondary | ICD-10-CM | POA: Diagnosis not present

## 2021-07-05 DIAGNOSIS — E782 Mixed hyperlipidemia: Secondary | ICD-10-CM | POA: Diagnosis not present

## 2021-07-05 DIAGNOSIS — Z7984 Long term (current) use of oral hypoglycemic drugs: Secondary | ICD-10-CM | POA: Diagnosis not present

## 2021-07-05 DIAGNOSIS — M5416 Radiculopathy, lumbar region: Secondary | ICD-10-CM | POA: Diagnosis not present

## 2021-07-05 DIAGNOSIS — G4700 Insomnia, unspecified: Secondary | ICD-10-CM | POA: Diagnosis not present

## 2021-07-05 DIAGNOSIS — E1142 Type 2 diabetes mellitus with diabetic polyneuropathy: Secondary | ICD-10-CM | POA: Diagnosis not present

## 2021-07-05 MED ORDER — ALBUTEROL SULFATE HFA 108 (90 BASE) MCG/ACT IN AERS
INHALATION_SPRAY | RESPIRATORY_TRACT | 2 refills | Status: DC
  Filled 2021-07-05: qty 54, 75d supply, fill #0
  Filled 2021-10-11: qty 54, 75d supply, fill #1

## 2021-07-05 MED ORDER — LEVOCETIRIZINE DIHYDROCHLORIDE 5 MG PO TABS
5.0000 mg | ORAL_TABLET | Freq: Every evening | ORAL | 2 refills | Status: DC
  Filled 2021-07-05: qty 90, 90d supply, fill #0
  Filled 2021-09-20 – 2021-10-11 (×3): qty 90, 90d supply, fill #1

## 2021-07-05 MED ORDER — ALPRAZOLAM 1 MG PO TABS
1.0000 mg | ORAL_TABLET | Freq: Three times a day (TID) | ORAL | 1 refills | Status: DC | PRN
  Filled 2021-07-05 – 2021-07-12 (×3): qty 270, 90d supply, fill #0
  Filled 2021-10-11: qty 270, 90d supply, fill #1

## 2021-07-05 MED ORDER — OMEPRAZOLE 40 MG PO CPDR
40.0000 mg | DELAYED_RELEASE_CAPSULE | Freq: Two times a day (BID) | ORAL | 1 refills | Status: DC
  Filled 2021-07-05: qty 180, 90d supply, fill #0
  Filled 2021-10-11: qty 180, 90d supply, fill #1

## 2021-07-05 MED ORDER — GABAPENTIN 400 MG PO CAPS
400.0000 mg | ORAL_CAPSULE | Freq: Three times a day (TID) | ORAL | 2 refills | Status: DC
  Filled 2021-07-05 – 2021-07-13 (×3): qty 270, 90d supply, fill #0

## 2021-07-05 MED ORDER — ATORVASTATIN CALCIUM 40 MG PO TABS
40.0000 mg | ORAL_TABLET | Freq: Every day | ORAL | 1 refills | Status: DC
  Filled 2021-07-05: qty 90, 90d supply, fill #0
  Filled 2021-10-11: qty 90, 90d supply, fill #1

## 2021-07-05 MED ORDER — HYDROCHLOROTHIAZIDE 25 MG PO TABS
25.0000 mg | ORAL_TABLET | Freq: Every day | ORAL | 1 refills | Status: DC
  Filled 2021-07-05: qty 90, 90d supply, fill #0
  Filled 2021-10-11: qty 90, 90d supply, fill #1

## 2021-07-05 MED ORDER — FENOFIBRATE MICRONIZED 134 MG PO CAPS
134.0000 mg | ORAL_CAPSULE | Freq: Every day | ORAL | 2 refills | Status: DC
  Filled 2021-07-05: qty 90, 90d supply, fill #0
  Filled 2021-10-11: qty 90, 90d supply, fill #1

## 2021-07-05 MED ORDER — ZOLPIDEM TARTRATE ER 12.5 MG PO TBCR
12.5000 mg | EXTENDED_RELEASE_TABLET | Freq: Every evening | ORAL | 1 refills | Status: DC | PRN
  Filled 2021-07-05: qty 90, 90d supply, fill #0
  Filled 2021-10-11: qty 90, 90d supply, fill #1

## 2021-07-05 MED ORDER — TRAMADOL HCL 50 MG PO TABS
50.0000 mg | ORAL_TABLET | Freq: Three times a day (TID) | ORAL | 1 refills | Status: DC
  Filled 2021-07-05: qty 270, 90d supply, fill #0

## 2021-07-05 MED ORDER — LIDOCAINE 5 % EX PTCH
1.0000 | MEDICATED_PATCH | Freq: Every day | CUTANEOUS | 3 refills | Status: DC
  Filled 2021-07-05: qty 60, 60d supply, fill #0

## 2021-07-05 MED ORDER — MONTELUKAST SODIUM 10 MG PO TABS
10.0000 mg | ORAL_TABLET | Freq: Every evening | ORAL | 2 refills | Status: DC
  Filled 2021-07-05: qty 90, 90d supply, fill #0
  Filled 2021-10-11: qty 90, 90d supply, fill #1

## 2021-07-05 MED ORDER — DULOXETINE HCL 60 MG PO CPEP
60.0000 mg | ORAL_CAPSULE | Freq: Two times a day (BID) | ORAL | 2 refills | Status: DC
  Filled 2021-07-05: qty 180, 90d supply, fill #0
  Filled 2021-10-11: qty 180, 90d supply, fill #1

## 2021-07-07 ENCOUNTER — Other Ambulatory Visit (HOSPITAL_COMMUNITY): Payer: Self-pay

## 2021-07-08 ENCOUNTER — Other Ambulatory Visit (HOSPITAL_COMMUNITY): Payer: Self-pay

## 2021-07-08 MED FILL — Metformin HCl Tab ER 24HR 500 MG: ORAL | 90 days supply | Qty: 360 | Fill #1 | Status: CN

## 2021-07-12 ENCOUNTER — Other Ambulatory Visit (HOSPITAL_COMMUNITY): Payer: Self-pay

## 2021-07-12 DIAGNOSIS — M5416 Radiculopathy, lumbar region: Secondary | ICD-10-CM | POA: Diagnosis not present

## 2021-07-12 DIAGNOSIS — M48061 Spinal stenosis, lumbar region without neurogenic claudication: Secondary | ICD-10-CM | POA: Diagnosis not present

## 2021-07-12 DIAGNOSIS — M7138 Other bursal cyst, other site: Secondary | ICD-10-CM | POA: Diagnosis not present

## 2021-07-12 MED ORDER — OXYCODONE-ACETAMINOPHEN 5-325 MG PO TABS
1.0000 | ORAL_TABLET | Freq: Four times a day (QID) | ORAL | 0 refills | Status: DC | PRN
  Filled 2021-07-12: qty 30, 8d supply, fill #0

## 2021-07-12 MED ORDER — METHOCARBAMOL 500 MG PO TABS
500.0000 mg | ORAL_TABLET | Freq: Four times a day (QID) | ORAL | 0 refills | Status: DC
  Filled 2021-07-12: qty 45, 12d supply, fill #0

## 2021-07-13 ENCOUNTER — Ambulatory Visit: Payer: 59 | Admitting: Podiatry

## 2021-07-13 ENCOUNTER — Other Ambulatory Visit (HOSPITAL_COMMUNITY): Payer: Self-pay

## 2021-07-13 MED FILL — Metformin HCl Tab ER 24HR 500 MG: ORAL | 90 days supply | Qty: 360 | Fill #1 | Status: AC

## 2021-07-20 ENCOUNTER — Other Ambulatory Visit (HOSPITAL_COMMUNITY): Payer: Self-pay

## 2021-07-20 MED ORDER — MELOXICAM 15 MG PO TABS
15.0000 mg | ORAL_TABLET | Freq: Every day | ORAL | 1 refills | Status: DC
  Filled 2021-07-20: qty 30, 30d supply, fill #0

## 2021-08-03 ENCOUNTER — Other Ambulatory Visit: Payer: Self-pay

## 2021-08-03 ENCOUNTER — Encounter: Payer: Self-pay | Admitting: Podiatry

## 2021-08-03 ENCOUNTER — Ambulatory Visit: Payer: 59 | Admitting: Podiatry

## 2021-08-03 DIAGNOSIS — G5792 Unspecified mononeuropathy of left lower limb: Secondary | ICD-10-CM | POA: Diagnosis not present

## 2021-08-03 NOTE — Progress Notes (Signed)
She presents today for follow-up of her left foot.  She states that she had a cyst removed from around the nerves of L3-L4 and L5 with laminectomies.  She states that her sciatica feels so much better but he has left her with some numbness to her left foot.  She states that the numbness in her left foot is much worse than it was prior to the surgery.  She has known significant numbness in her right foot.  Objective: Vital signs are stable alert and oriented x3 pulses are strongly palpable capillary fill time is immediate no open lesions or wounds.  Neuritis most likely associated with inflammation from the back surgery.  Neuropathy bilaterally associated with diabetes.  Plan: Follow-up with her in 6 months I did explain to her that it could take 6 months to a year before the symptoms from her back surgery resolved.

## 2021-08-05 ENCOUNTER — Ambulatory Visit: Payer: 59 | Admitting: Physical Medicine & Rehabilitation

## 2021-08-05 DIAGNOSIS — M069 Rheumatoid arthritis, unspecified: Secondary | ICD-10-CM | POA: Diagnosis not present

## 2021-08-05 DIAGNOSIS — H40013 Open angle with borderline findings, low risk, bilateral: Secondary | ICD-10-CM | POA: Diagnosis not present

## 2021-08-05 DIAGNOSIS — E119 Type 2 diabetes mellitus without complications: Secondary | ICD-10-CM | POA: Diagnosis not present

## 2021-08-05 DIAGNOSIS — H04123 Dry eye syndrome of bilateral lacrimal glands: Secondary | ICD-10-CM | POA: Diagnosis not present

## 2021-08-05 DIAGNOSIS — Z79899 Other long term (current) drug therapy: Secondary | ICD-10-CM | POA: Diagnosis not present

## 2021-08-05 DIAGNOSIS — H524 Presbyopia: Secondary | ICD-10-CM | POA: Diagnosis not present

## 2021-08-12 ENCOUNTER — Encounter: Payer: Self-pay | Admitting: Physical Medicine & Rehabilitation

## 2021-08-12 ENCOUNTER — Other Ambulatory Visit: Payer: Self-pay

## 2021-08-12 ENCOUNTER — Encounter: Payer: 59 | Attending: Physical Medicine & Rehabilitation | Admitting: Physical Medicine & Rehabilitation

## 2021-08-12 VITALS — BP 135/87 | HR 89 | Temp 98.0°F | Ht 69.0 in | Wt 205.2 lb

## 2021-08-12 DIAGNOSIS — Z79899 Other long term (current) drug therapy: Secondary | ICD-10-CM | POA: Insufficient documentation

## 2021-08-12 DIAGNOSIS — Z79891 Long term (current) use of opiate analgesic: Secondary | ICD-10-CM | POA: Diagnosis not present

## 2021-08-12 DIAGNOSIS — G894 Chronic pain syndrome: Secondary | ICD-10-CM | POA: Insufficient documentation

## 2021-08-12 DIAGNOSIS — Z5181 Encounter for therapeutic drug level monitoring: Secondary | ICD-10-CM | POA: Insufficient documentation

## 2021-08-12 NOTE — Progress Notes (Signed)
Subjective:    Patient ID: Amy Adams, female    DOB: August 15, 1966, 55 y.o.   MRN: [id number]  HPI CC:  L>R foot burning pain   55yo female with diabetic neuropathy diagnosed about 15 years ago with initial hemoglobin A1c of 13.  She is now being treated by her endocrinologist Dr. Soyla Murphy who is using a combination of Semglee and NovoLog.  Her diabetes is now under good control.  She did have a left second toe amputation due to infection.  She has seen vascular surgery, ankle-brachial index was normal bilaterally on 08/21/2020.  She does not walk without shoes.  She has hypersensitivity of her feet at night and needs to wear socks. 400mg  TID- DR Kenton Kingfisher  Alprazolam 1mg  TID prn, since husband died several years ago prescribed by Dr Kenton Kingfisher   Had a bad case of Covid this summer PCP Dr Kenton Kingfisher took her out of work  Hx of RA on MTX and Plaquenil- Dr Amil Amen who was prescribing tramadol as well until he told pt he no longer could    Hx PVD seen by [facility] x 62yrs  on Semglee and Novalog Dr Chalmers Cater  About 1 months ago underwent left Laminectomy with synovial cyst removal  L4-L5, with 95% improvement of sciatic pain- Dr Sherley Bounds has 6wk f/u   Not on chronic steroid   Pain Inventory Average Pain 7 Pain Right Now 4 My pain is dull, stabbing, tingling, and aching  In the last 24 hours, has pain interfered with the following? General activity 4 Relation with others 4 Enjoyment of life 4 What TIME of day is your pain at its worst? evening and night Sleep (in general) Poor  Pain is worse with: unsure Pain improves with: medication Relief from Meds: 7  how many minutes can you walk? 20 ability to climb steps?  yes do you drive?  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 numbness tingling spasms depression anxiety  New pt  New pt    Family History  Problem Relation Age of Onset   Diabetes Mother    Migraines Mother     COPD Father    Cancer Father    Diabetes Sister    Social History   Socioeconomic History   Marital status: Divorced    Spouse name: Not on file   Number of children: Not on file   Years of education: Not on file   Highest education level: Not on file  Occupational History   Not on file  Tobacco Use   Smoking status: Never   Smokeless tobacco: Never  Vaping Use   Vaping Use: Never used  Substance and Sexual Activity   Alcohol use: No   Drug use: Never   Sexual activity: Not on file  Other Topics Concern   Not on file  Social History Narrative   Not on file   Social Determinants of Health   Financial Resource Strain: Not on file  Food Insecurity: Not on file  Transportation Needs: Not on file  Physical Activity: Not on file  Stress: Not on file  Social Connections: Not on file   Past Surgical History:  Procedure Laterality Date   [place]   with Right Salpingo--ophorectomy   ANAL SPHINCTEROTOMY  01-05-2006    dr Rise Patience @[facility]    BUNIONECTOMY  09-25-2009   dr Milinda Pointer @[facility]    right great toe   CARPAL TUNNEL RELEASE  Bilateral 1996;  [place]   FOOT SURGERY  12/ 2014   dr hyatt   left 2nd hammertoe repair,  left 2nd metatarsal matrixectomy, left heel endoscopic plantar fasiotomy   IR CATHETER TUBE CHANGE  01/01/2019   IR RADIOLOGIST EVAL & MGMT  11/28/2018   IR RADIOLOGIST EVAL & MGMT  11/29/2018   IR RADIOLOGIST EVAL & MGMT  12/06/2018   IR RADIOLOGIST EVAL & MGMT  12/25/2018   IR RADIOLOGIST EVAL & MGMT  01/08/2019   IR RADIOLOGIST EVAL & MGMT  01/29/2019   PANNICULECTOMY N/A 10/26/2018   Procedure: PANNICULECTOMY;  Surgeon: Johnathan Hausen, MD;  Location: [facility];  Service: General;  Laterality: N/A;   TENDON REPAIR  09/2015   left posterior tibial and peroneal tendon repairs   TOE AMPUTATION  01/2015   right 2nd toe   TONSILLECTOMY  age 24   TRANSOBTURATOR SLING  01-08-2004   dr Jeffie Pollock @[facility]    Past Medical History:   Diagnosis Date   Anxiety    Arthritis    Chronic foot pain, left    GERD (gastroesophageal reflux disease)    History of anal fissures    Hyperlipidemia    Hypertension    Hypothyroidism    Insulin dependent type 2 diabetes mellitus ([place])    endrocrinologist-- dr Chalmers Cater   Migraines    Peripheral neuropathy    RA (rheumatoid arthritis) [facility])    rheumatologist-  dr Amil Amen   SUI (stress urinary incontinence, female)    Wears glasses    BP 135/87   Pulse 89   Temp 98 F (36.7 C) (Oral)   Ht 5\' 9"  (1.753 m)   Wt 205 lb 3.2 oz (93.1 kg)   SpO2 97%   BMI 30.30 kg/m   Opioid Risk Score:   Fall Risk Score:  `1  Depression screen PHQ 2/9  Depression screen [facility] 2/9 08/12/2021 12/23/2019 04/03/2019 01/11/2018 12/29/2017 04/28/2017 01/20/2017  Decreased Interest 0 0 1 1 1  0 0  Down, Depressed, Hopeless 1 0 0 2 2 1  0  PHQ - 2 Score 1 0 1 3 3 1  0  Altered sleeping 3 - - - - - -  Tired, decreased energy 1 - - - - - -  Change in appetite 3 - - - - - -  Feeling bad or failure about yourself  0 - - - - - -  Trouble concentrating 0 - - - - - -  Moving slowly or fidgety/restless 0 - - - - - -  Suicidal thoughts 0 - - - - - -  PHQ-9 Score 8 - - - - - -  Difficult doing work/chores Not difficult at all - - - - - -  Some recent data might be hidden     Review of Systems  Musculoskeletal:        Bilateral crease of arm pain Bilateral wrist pain Left pain  All other systems reviewed and are negative.     Objective:   Physical Exam Vitals and nursing note reviewed.  Constitutional:      Appearance: She is obese.  HENT:     Head: Normocephalic and atraumatic.  Eyes:     Extraocular Movements: Extraocular movements intact.     Conjunctiva/sclera: Conjunctivae normal.     Pupils: Pupils are equal, round, and reactive to light.  Cardiovascular:     Rate and Rhythm: Normal rate and regular rhythm.     Pulses:  Dorsalis pedis pulses are 1+ on the right side and 1+ on the left  side.     Heart sounds: Normal heart sounds. No murmur heard. Pulmonary:     Effort: Pulmonary effort is normal. No respiratory distress.     Breath sounds: Normal breath sounds.  Abdominal:     General: Abdomen is flat. There is no distension.     Palpations: Abdomen is soft. There is no mass.  Musculoskeletal:        General: No swelling or tenderness.     Right foot: Normal range of motion. No deformity, bunion, Charcot foot or foot drop.     Left foot: Normal range of motion. No deformity, bunion, Charcot foot or foot drop.  Feet:     Right foot:     Skin integrity: Skin integrity normal.     Toenail Condition: Right toenails are abnormally thick.     Left foot:     Skin integrity: Skin integrity normal.     Toenail Condition: Left toenails are abnormally thick.     Comments: Left second toe amputation Skin:    General: Skin is warm and dry.  Neurological:     General: No focal deficit present.     Mental Status: She is alert and oriented to person, place, and time.     Cranial Nerves: No dysarthria or facial asymmetry.     Sensory: Sensory deficit present.     Deep Tendon Reflexes:     Reflex Scores:      Patellar reflexes are 2+ on the right side and 2+ on the left side.      Achilles reflexes are 0 on the right side and 0 on the left side.    Comments: Patient has wide-based gait walks very carefully with excess toe curling Motor strength is 5/5 bilateral deltoid, bicep, tricep,, hip flexor, knee extensor, flexor Sensation reduced to pinprick below the right midfoot she is able to sense pinprick but no compared to above the right ankle Sensation reduced to pinprick below the left ankle, is able to identify which toes are touched with pain but it is a dull sensation.  No hypersensitivity to touch on the feet.   Psychiatric:        Mood and Affect: Mood normal.        Behavior: Behavior normal.          Assessment & Plan:  1.  Painful diabetic peripheral neuropathy  in a patient with no evidence of significant peripheral vascular disease.   Pt functioning at Mod I level Pain is well controlled using tramadol 50mg  TID and Gabapentin 400mg  TID Will check urine drug screen,opioid risk tool score is low RTC 1 mo with NP can Rx tramadol through this office if UDS consistent

## 2021-08-12 NOTE — Patient Instructions (Addendum)
Will need to review urine drug screen results prior to prescribing tramadol

## 2021-08-18 ENCOUNTER — Other Ambulatory Visit (HOSPITAL_COMMUNITY): Payer: Self-pay

## 2021-08-18 LAB — TOXASSURE SELECT,+ANTIDEPR,UR

## 2021-08-19 ENCOUNTER — Telehealth: Payer: Self-pay | Admitting: *Deleted

## 2021-08-19 NOTE — Telephone Encounter (Signed)
Urine drug screen for this encounter is consistent for prescribed medication 

## 2021-08-26 ENCOUNTER — Other Ambulatory Visit (HOSPITAL_COMMUNITY): Payer: Self-pay

## 2021-08-26 MED FILL — Hydroxychloroquine Sulfate Tab 200 MG: ORAL | 90 days supply | Qty: 180 | Fill #0 | Status: AC

## 2021-08-26 MED FILL — Hydroxychloroquine Sulfate Tab 200 MG: ORAL | 90 days supply | Qty: 180 | Fill #1 | Status: CN

## 2021-08-26 MED FILL — Continuous Glucose System Sensor: 28 days supply | Qty: 2 | Fill #1 | Status: CN

## 2021-08-30 ENCOUNTER — Other Ambulatory Visit (HOSPITAL_COMMUNITY): Payer: Self-pay

## 2021-08-30 DIAGNOSIS — E669 Obesity, unspecified: Secondary | ICD-10-CM | POA: Diagnosis not present

## 2021-08-30 DIAGNOSIS — M2550 Pain in unspecified joint: Secondary | ICD-10-CM | POA: Diagnosis not present

## 2021-08-30 DIAGNOSIS — M0589 Other rheumatoid arthritis with rheumatoid factor of multiple sites: Secondary | ICD-10-CM | POA: Diagnosis not present

## 2021-08-30 DIAGNOSIS — M5136 Other intervertebral disc degeneration, lumbar region: Secondary | ICD-10-CM | POA: Diagnosis not present

## 2021-08-30 DIAGNOSIS — L401 Generalized pustular psoriasis: Secondary | ICD-10-CM | POA: Diagnosis not present

## 2021-08-30 DIAGNOSIS — M150 Primary generalized (osteo)arthritis: Secondary | ICD-10-CM | POA: Diagnosis not present

## 2021-08-30 DIAGNOSIS — Z79899 Other long term (current) drug therapy: Secondary | ICD-10-CM | POA: Diagnosis not present

## 2021-08-30 DIAGNOSIS — Z6831 Body mass index (BMI) 31.0-31.9, adult: Secondary | ICD-10-CM | POA: Diagnosis not present

## 2021-08-30 MED ORDER — HYDROXYCHLOROQUINE SULFATE 200 MG PO TABS
400.0000 mg | ORAL_TABLET | Freq: Every day | ORAL | 3 refills | Status: DC
  Filled 2021-08-30 – 2021-11-29 (×2): qty 180, 90d supply, fill #0
  Filled 2022-02-28: qty 180, 90d supply, fill #1
  Filled 2022-05-31: qty 180, 90d supply, fill #2
  Filled 2022-07-04: qty 180, 90d supply, fill #3

## 2021-08-30 MED ORDER — METHOTREXATE SODIUM CHEMO INJECTION 50 MG/2ML
INTRAMUSCULAR | 1 refills | Status: DC
  Filled 2021-08-30: qty 10, 84d supply, fill #0

## 2021-08-31 ENCOUNTER — Other Ambulatory Visit (HOSPITAL_COMMUNITY): Payer: Self-pay

## 2021-08-31 DIAGNOSIS — M5416 Radiculopathy, lumbar region: Secondary | ICD-10-CM | POA: Diagnosis not present

## 2021-09-02 ENCOUNTER — Encounter: Payer: Self-pay | Admitting: Registered Nurse

## 2021-09-02 ENCOUNTER — Other Ambulatory Visit (HOSPITAL_COMMUNITY): Payer: Self-pay

## 2021-09-02 ENCOUNTER — Encounter: Payer: 59 | Attending: Physical Medicine & Rehabilitation | Admitting: Registered Nurse

## 2021-09-02 ENCOUNTER — Encounter: Payer: 59 | Admitting: Registered Nurse

## 2021-09-02 ENCOUNTER — Other Ambulatory Visit: Payer: Self-pay

## 2021-09-02 VITALS — BP 136/82 | HR 79 | Ht 69.0 in | Wt 214.0 lb

## 2021-09-02 DIAGNOSIS — M545 Low back pain, unspecified: Secondary | ICD-10-CM

## 2021-09-02 DIAGNOSIS — Z794 Long term (current) use of insulin: Secondary | ICD-10-CM | POA: Diagnosis not present

## 2021-09-02 DIAGNOSIS — G5793 Unspecified mononeuropathy of bilateral lower limbs: Secondary | ICD-10-CM | POA: Diagnosis not present

## 2021-09-02 DIAGNOSIS — Z79891 Long term (current) use of opiate analgesic: Secondary | ICD-10-CM | POA: Diagnosis not present

## 2021-09-02 DIAGNOSIS — Z79899 Other long term (current) drug therapy: Secondary | ICD-10-CM

## 2021-09-02 DIAGNOSIS — W19XXXD Unspecified fall, subsequent encounter: Secondary | ICD-10-CM

## 2021-09-02 DIAGNOSIS — G894 Chronic pain syndrome: Secondary | ICD-10-CM

## 2021-09-02 DIAGNOSIS — Z9181 History of falling: Secondary | ICD-10-CM | POA: Diagnosis not present

## 2021-09-02 DIAGNOSIS — M5416 Radiculopathy, lumbar region: Secondary | ICD-10-CM | POA: Diagnosis not present

## 2021-09-02 DIAGNOSIS — G8929 Other chronic pain: Secondary | ICD-10-CM

## 2021-09-02 DIAGNOSIS — Y92009 Unspecified place in unspecified non-institutional (private) residence as the place of occurrence of the external cause: Secondary | ICD-10-CM | POA: Diagnosis not present

## 2021-09-02 DIAGNOSIS — E1142 Type 2 diabetes mellitus with diabetic polyneuropathy: Secondary | ICD-10-CM | POA: Insufficient documentation

## 2021-09-02 DIAGNOSIS — Z760 Encounter for issue of repeat prescription: Secondary | ICD-10-CM | POA: Diagnosis not present

## 2021-09-02 DIAGNOSIS — R296 Repeated falls: Secondary | ICD-10-CM | POA: Insufficient documentation

## 2021-09-02 DIAGNOSIS — Z5181 Encounter for therapeutic drug level monitoring: Secondary | ICD-10-CM

## 2021-09-02 MED ORDER — TRAMADOL HCL 50 MG PO TABS
50.0000 mg | ORAL_TABLET | Freq: Three times a day (TID) | ORAL | 1 refills | Status: DC | PRN
  Filled 2021-09-02 – 2021-10-11 (×2): qty 270, 90d supply, fill #0

## 2021-09-02 NOTE — Progress Notes (Signed)
Subjective:    Patient ID: Amy Adams, female    DOB: [birth year], 55 y.o.   MRN: [id number]  HPI: Amy Adams is a 55 y.o. female who returns for follow up appointment for chronic pain and medication refill. She states her  pain is located in her lower back and left foot pain with tingling and numbness. She rates her pain 7. Her current exercise regime is walking , she is scheduled to start physical therapy this week, she reports.  [name] three weeks ago she had a fell in her home, she states she was walking and her left leg gave out and she fell forward. She was able to pick herself up. Educated on fall prevention,she verbalizes understanding.   Amy Adams Morphine equivalent is 15.00 MME. She is also prescribed Alprazolam by Dr. Kenton Kingfisher .We have discussed the black box warning of using opioids and benzodiazepines. I highlighted the dangers of using these drugs together and discussed the adverse events including respiratory suppression, overdose, cognitive impairment and importance of compliance with current regimen. We will continue to monitor and adjust as indicated.    Last UDS was Performed on 08/12/2021, it was consistent.       Pain Inventory Average Pain 4 Pain Right Now 7 My pain is dull, stabbing, and aching  In the last 24 hours, has pain interfered with the following? General activity 3 Relation with others 0 Enjoyment of life 3 What TIME of day is your pain at its worst? morning , daytime, evening, and night Sleep (in general) Poor  Pain is worse with: bending, squatting Pain improves with: heat/ice, therapy/exercise, and medication Relief from Meds: 6  Family History  Problem Relation Age of Onset   Diabetes Mother    Migraines Mother    COPD Father    Cancer Father    Diabetes Sister    Social History   Socioeconomic History   Marital status: Divorced    Spouse name: Not on file   Number of children: Not on file   Years of education: Not  on file   Highest education level: Not on file  Occupational History   Not on file  Tobacco Use   Smoking status: Never   Smokeless tobacco: Never  Vaping Use   Vaping Use: Never used  Substance and Sexual Activity   Alcohol use: No   Drug use: Never   Sexual activity: Not on file  Other Topics Concern   Not on file  Social History Narrative   Not on file   Social Determinants of Health   Financial Resource Strain: Not on file  Food Insecurity: Not on file  Transportation Needs: Not on file  Physical Activity: Not on file  Stress: Not on file  Social Connections: Not on file   Past Surgical History:  Procedure Laterality Date   [place]   with Right Salpingo--ophorectomy   ANAL SPHINCTEROTOMY  01-05-2006    dr Rise Patience @[facility]    BUNIONECTOMY  09-25-2009   dr Milinda Pointer @[facility]    right great toe   CARPAL TUNNEL RELEASE Bilateral 1996;  [place]  12/ 2014   dr Milinda Pointer   left 2nd hammertoe repair,  left 2nd metatarsal matrixectomy, left heel endoscopic plantar fasiotomy   IR CATHETER TUBE CHANGE  01/01/2019   IR RADIOLOGIST EVAL & MGMT  11/28/2018   IR RADIOLOGIST EVAL & MGMT  11/29/2018   IR RADIOLOGIST EVAL &  MGMT  12/06/2018   IR RADIOLOGIST EVAL & MGMT  12/25/2018   IR RADIOLOGIST EVAL & MGMT  01/08/2019   IR RADIOLOGIST EVAL & MGMT  01/29/2019   PANNICULECTOMY N/A 10/26/2018   Procedure: PANNICULECTOMY;  Surgeon: Johnathan Hausen, MD;  Location: [facility];  Service: General;  Laterality: N/A;   TENDON REPAIR  09/2015   left posterior tibial and peroneal tendon repairs   TOE AMPUTATION  01/2015   right 2nd toe   TONSILLECTOMY  age 89   TRANSOBTURATOR SLING  01-08-2004   dr Jeffie Pollock @[facility]    Past Surgical History:  Procedure Laterality Date   ABDOMINAL HYSTERECTOMY  1990   with Right Salpingo--ophorectomy   ANAL SPHINCTEROTOMY  01-05-2006    dr Rise Patience @[facility]    BUNIONECTOMY  09-25-2009   dr Milinda Pointer @[facility]    right great toe    CARPAL TUNNEL RELEASE Bilateral 1996;  [place]  12/ 2014   dr Milinda Pointer   left 2nd hammertoe repair,  left 2nd metatarsal matrixectomy, left heel endoscopic plantar fasiotomy   IR CATHETER TUBE CHANGE  01/01/2019   IR RADIOLOGIST EVAL & MGMT  11/28/2018   IR RADIOLOGIST EVAL & MGMT  11/29/2018   IR RADIOLOGIST EVAL & MGMT  12/06/2018   IR RADIOLOGIST EVAL & MGMT  12/25/2018   IR RADIOLOGIST EVAL & MGMT  01/08/2019   IR RADIOLOGIST EVAL & MGMT  01/29/2019   PANNICULECTOMY N/A 10/26/2018   Procedure: PANNICULECTOMY;  Surgeon: Johnathan Hausen, MD;  Location: [facility];  Service: General;  Laterality: N/A;   TENDON REPAIR  09/2015   left posterior tibial and peroneal tendon repairs   TOE AMPUTATION  01/2015   right 2nd toe   TONSILLECTOMY  age 27   TRANSOBTURATOR SLING  01-08-2004   dr Jeffie Pollock @[facility]    Past Medical History:  Diagnosis Date   Anxiety    Arthritis    Chronic foot pain, left    GERD (gastroesophageal reflux disease)    History of anal fissures    Hyperlipidemia    Hypertension    Hypothyroidism    Insulin dependent type 2 diabetes mellitus ([place])    endrocrinologist-- dr Chalmers Cater   Migraines    Peripheral neuropathy    RA (rheumatoid arthritis) [facility])    rheumatologist-  dr Amil Amen   SUI (stress urinary incontinence, female)    Wears glasses    Ht 5\' 9"  (1.753 m)   Wt 214 lb (97.1 kg)   BMI 31.60 kg/m   Opioid Risk Score:   Fall Risk Score:  `1  Depression screen PHQ 2/9  Depression screen [facility] 2/9 08/12/2021 12/23/2019 04/03/2019 01/11/2018 12/29/2017 04/28/2017 01/20/2017  Decreased Interest 0 0 1 1 1  0 0  Down, Depressed, Hopeless 1 0 0 2 2 1  0  PHQ - 2 Score 1 0 1 3 3 1  0  Altered sleeping 3 - - - - - -  Tired, decreased energy 1 - - - - - -  Change in appetite 3 - - - - - -  Feeling bad or failure about yourself  0 - - - - - -  Trouble concentrating 0 - - - - - -  Moving slowly or fidgety/restless 0 - - - - - -  Suicidal thoughts 0 - - -  - - -  PHQ-9 Score 8 - - - - - -  Difficult doing work/chores Not difficult at all - - - - - -  Some recent data might be hidden     Review of Systems  Constitutional: Negative.   HENT: Negative.    Eyes: Negative.   Respiratory: Negative.    Cardiovascular: Negative.   Gastrointestinal: Negative.   Endocrine: Negative.   Genitourinary: Negative.   Musculoskeletal:  Positive for back pain.  Skin: Negative.   Allergic/Immunologic: Negative.   Neurological: Negative.   Hematological: Negative.   Psychiatric/Behavioral: Negative.        Objective:   Physical Exam Vitals and nursing note reviewed.  Constitutional:      Appearance: Normal appearance.  Cardiovascular:     Rate and Rhythm: Normal rate and regular rhythm.     Pulses: Normal pulses.     Heart sounds: Normal heart sounds.  Pulmonary:     Effort: Pulmonary effort is normal.     Breath sounds: Normal breath sounds.  Musculoskeletal:     Cervical back: Normal range of motion and neck supple.     Comments: Normal Muscle Bulk and Muscle Testing Reveals:  Upper Extremities: Full ROM and Muscle Strength 5/5 Lumbar Paraspinal Tenderness: L-3-L-5 Lower Extremities: Full ROM and Muscle Strength 5/5 Arises from Table with ease Narrow Based  Gait     Skin:    General: Skin is warm and dry.  Neurological:     Mental Status: She is alert and oriented to person, place, and time.  Psychiatric:        Mood and Affect: Mood normal.        Behavior: Behavior normal.         Assessment & Plan:  Chronic Bilateral Low Back Pain without Sciatica: Continue HEP as Tolerated. Continue current medication regimen. Continue to monitor.  Painful Diabetic Neuropathy: RX: Tramadol 50 mg TID as needed for pain #270. Dr Letta Pate agrees with three month supply. Continue Gabapentin. We will continue the opioid monitoring program, this consists of regular clinic visits, examinations, urine drug screen, pill counts as well as use of  [place] Controlled Substance Reporting system. A 12 month History has been reviewed on the [place] Controlled Substance Reporting System on 09/02/2021.  F/U in 6 months

## 2021-09-09 ENCOUNTER — Other Ambulatory Visit (HOSPITAL_COMMUNITY): Payer: Self-pay

## 2021-09-09 MED ORDER — HYDROCHLOROTHIAZIDE 25 MG PO TABS
25.0000 mg | ORAL_TABLET | Freq: Every morning | ORAL | 1 refills | Status: DC
  Filled 2021-09-09 – 2021-12-15 (×2): qty 90, 90d supply, fill #0

## 2021-09-10 ENCOUNTER — Other Ambulatory Visit (HOSPITAL_COMMUNITY): Payer: Self-pay

## 2021-09-10 DIAGNOSIS — Z1231 Encounter for screening mammogram for malignant neoplasm of breast: Secondary | ICD-10-CM | POA: Diagnosis not present

## 2021-09-10 DIAGNOSIS — Z6832 Body mass index (BMI) 32.0-32.9, adult: Secondary | ICD-10-CM | POA: Diagnosis not present

## 2021-09-10 DIAGNOSIS — Z01419 Encounter for gynecological examination (general) (routine) without abnormal findings: Secondary | ICD-10-CM | POA: Diagnosis not present

## 2021-09-10 MED ORDER — VALSARTAN 320 MG PO TABS
320.0000 mg | ORAL_TABLET | Freq: Every day | ORAL | 0 refills | Status: DC
  Filled 2021-09-10: qty 90, 90d supply, fill #0

## 2021-09-10 MED ORDER — CLOBETASOL PROPIONATE 0.05 % EX OINT
1.0000 "application " | TOPICAL_OINTMENT | Freq: Two times a day (BID) | CUTANEOUS | 1 refills | Status: DC
  Filled 2021-09-10: qty 30, 15d supply, fill #0

## 2021-09-14 ENCOUNTER — Other Ambulatory Visit (HOSPITAL_COMMUNITY): Payer: Self-pay

## 2021-09-14 ENCOUNTER — Encounter: Payer: Self-pay | Admitting: Podiatry

## 2021-09-14 DIAGNOSIS — M5416 Radiculopathy, lumbar region: Secondary | ICD-10-CM | POA: Diagnosis not present

## 2021-09-14 MED FILL — Continuous Glucose System Sensor: 28 days supply | Qty: 2 | Fill #0 | Status: AC

## 2021-09-14 MED FILL — Continuous Glucose System Sensor: 28 days supply | Qty: 2 | Fill #1 | Status: CN

## 2021-09-17 ENCOUNTER — Other Ambulatory Visit (HOSPITAL_COMMUNITY): Payer: Self-pay

## 2021-09-20 ENCOUNTER — Other Ambulatory Visit (HOSPITAL_COMMUNITY): Payer: Self-pay

## 2021-09-24 ENCOUNTER — Other Ambulatory Visit (HOSPITAL_COMMUNITY): Payer: Self-pay

## 2021-09-30 ENCOUNTER — Other Ambulatory Visit (HOSPITAL_COMMUNITY): Payer: Self-pay

## 2021-09-30 MED ORDER — MELOXICAM 15 MG PO TABS
15.0000 mg | ORAL_TABLET | Freq: Every day | ORAL | 1 refills | Status: DC
  Filled 2021-09-30: qty 15, 15d supply, fill #0
  Filled 2021-10-11: qty 30, 30d supply, fill #0

## 2021-10-06 ENCOUNTER — Other Ambulatory Visit (HOSPITAL_COMMUNITY): Payer: Self-pay

## 2021-10-11 ENCOUNTER — Other Ambulatory Visit (HOSPITAL_COMMUNITY): Payer: Self-pay

## 2021-10-11 MED FILL — Levothyroxine Sodium Tab 50 MCG: ORAL | 90 days supply | Qty: 90 | Fill #1 | Status: AC

## 2021-10-11 MED FILL — Metformin HCl Tab ER 24HR 500 MG: ORAL | 90 days supply | Qty: 360 | Fill #2 | Status: AC

## 2021-10-19 DIAGNOSIS — E1165 Type 2 diabetes mellitus with hyperglycemia: Secondary | ICD-10-CM | POA: Diagnosis not present

## 2021-10-19 DIAGNOSIS — E039 Hypothyroidism, unspecified: Secondary | ICD-10-CM | POA: Diagnosis not present

## 2021-10-19 DIAGNOSIS — E7800 Pure hypercholesterolemia, unspecified: Secondary | ICD-10-CM | POA: Diagnosis not present

## 2021-11-03 ENCOUNTER — Other Ambulatory Visit (HOSPITAL_COMMUNITY): Payer: Self-pay

## 2021-11-03 DIAGNOSIS — I10 Essential (primary) hypertension: Secondary | ICD-10-CM | POA: Diagnosis not present

## 2021-11-03 DIAGNOSIS — E1165 Type 2 diabetes mellitus with hyperglycemia: Secondary | ICD-10-CM | POA: Diagnosis not present

## 2021-11-03 DIAGNOSIS — E7800 Pure hypercholesterolemia, unspecified: Secondary | ICD-10-CM | POA: Diagnosis not present

## 2021-11-03 DIAGNOSIS — M069 Rheumatoid arthritis, unspecified: Secondary | ICD-10-CM | POA: Diagnosis not present

## 2021-11-03 DIAGNOSIS — E039 Hypothyroidism, unspecified: Secondary | ICD-10-CM | POA: Diagnosis not present

## 2021-11-03 DIAGNOSIS — G609 Hereditary and idiopathic neuropathy, unspecified: Secondary | ICD-10-CM | POA: Diagnosis not present

## 2021-11-03 DIAGNOSIS — E049 Nontoxic goiter, unspecified: Secondary | ICD-10-CM | POA: Diagnosis not present

## 2021-11-03 MED ORDER — OZEMPIC (0.25 OR 0.5 MG/DOSE) 2 MG/1.5ML ~~LOC~~ SOPN
0.2500 mg | PEN_INJECTOR | SUBCUTANEOUS | 6 refills | Status: DC
  Filled 2021-11-03: qty 1.5, 56d supply, fill #0

## 2021-11-16 ENCOUNTER — Other Ambulatory Visit (HOSPITAL_COMMUNITY): Payer: Self-pay

## 2021-11-16 MED ORDER — DEXCOM G6 TRANSMITTER MISC
4 refills | Status: DC
  Filled 2021-11-16 – 2021-11-26 (×4): qty 1, 90d supply, fill #0

## 2021-11-16 MED ORDER — OZEMPIC (0.25 OR 0.5 MG/DOSE) 2 MG/1.5ML ~~LOC~~ SOPN
0.5000 mg | PEN_INJECTOR | SUBCUTANEOUS | 6 refills | Status: DC
  Filled 2021-11-16 (×2): qty 1.5, 28d supply, fill #0

## 2021-11-16 MED ORDER — DEXCOM G6 SENSOR MISC
4 refills | Status: DC
  Filled 2021-11-16 (×2): qty 9, 84d supply, fill #0
  Filled 2021-11-16: qty 9, 90d supply, fill #0
  Filled 2021-11-23: qty 9, 84d supply, fill #0
  Filled 2021-11-26: qty 3, 30d supply, fill #0

## 2021-11-16 MED FILL — Continuous Glucose System Sensor: 28 days supply | Qty: 2 | Fill #1 | Status: CN

## 2021-11-17 ENCOUNTER — Other Ambulatory Visit (HOSPITAL_COMMUNITY): Payer: Self-pay

## 2021-11-19 ENCOUNTER — Other Ambulatory Visit (HOSPITAL_COMMUNITY): Payer: Self-pay

## 2021-11-19 DIAGNOSIS — M79644 Pain in right finger(s): Secondary | ICD-10-CM | POA: Diagnosis not present

## 2021-11-19 DIAGNOSIS — M5136 Other intervertebral disc degeneration, lumbar region: Secondary | ICD-10-CM | POA: Diagnosis not present

## 2021-11-19 DIAGNOSIS — Z6831 Body mass index (BMI) 31.0-31.9, adult: Secondary | ICD-10-CM | POA: Diagnosis not present

## 2021-11-19 DIAGNOSIS — E669 Obesity, unspecified: Secondary | ICD-10-CM | POA: Diagnosis not present

## 2021-11-19 DIAGNOSIS — L401 Generalized pustular psoriasis: Secondary | ICD-10-CM | POA: Diagnosis not present

## 2021-11-19 DIAGNOSIS — Z79899 Other long term (current) drug therapy: Secondary | ICD-10-CM | POA: Diagnosis not present

## 2021-11-19 DIAGNOSIS — M150 Primary generalized (osteo)arthritis: Secondary | ICD-10-CM | POA: Diagnosis not present

## 2021-11-19 DIAGNOSIS — M0589 Other rheumatoid arthritis with rheumatoid factor of multiple sites: Secondary | ICD-10-CM | POA: Diagnosis not present

## 2021-11-23 ENCOUNTER — Other Ambulatory Visit (HOSPITAL_COMMUNITY): Payer: Self-pay

## 2021-11-24 ENCOUNTER — Other Ambulatory Visit (HOSPITAL_COMMUNITY): Payer: Self-pay

## 2021-11-24 DIAGNOSIS — Z6831 Body mass index (BMI) 31.0-31.9, adult: Secondary | ICD-10-CM | POA: Diagnosis not present

## 2021-11-24 DIAGNOSIS — M79644 Pain in right finger(s): Secondary | ICD-10-CM | POA: Diagnosis not present

## 2021-11-24 DIAGNOSIS — L401 Generalized pustular psoriasis: Secondary | ICD-10-CM | POA: Diagnosis not present

## 2021-11-24 DIAGNOSIS — M5136 Other intervertebral disc degeneration, lumbar region: Secondary | ICD-10-CM | POA: Diagnosis not present

## 2021-11-24 DIAGNOSIS — Z79899 Other long term (current) drug therapy: Secondary | ICD-10-CM | POA: Diagnosis not present

## 2021-11-24 DIAGNOSIS — M150 Primary generalized (osteo)arthritis: Secondary | ICD-10-CM | POA: Diagnosis not present

## 2021-11-24 DIAGNOSIS — M0589 Other rheumatoid arthritis with rheumatoid factor of multiple sites: Secondary | ICD-10-CM | POA: Diagnosis not present

## 2021-11-24 DIAGNOSIS — E669 Obesity, unspecified: Secondary | ICD-10-CM | POA: Diagnosis not present

## 2021-11-24 MED ORDER — DEXCOM G6 SENSOR MISC
4 refills | Status: DC
  Filled 2021-11-24: qty 3, 90d supply, fill #0
  Filled 2021-12-21: qty 3, 30d supply, fill #0
  Filled 2022-02-28: qty 3, 30d supply, fill #1
  Filled 2022-05-11: qty 9, 84d supply, fill #2

## 2021-11-24 MED ORDER — DEXCOM G6 SENSOR MISC
4 refills | Status: DC
  Filled 2021-11-24 – 2022-01-31 (×2): qty 3, 30d supply, fill #0
  Filled 2022-03-29: qty 3, 30d supply, fill #1

## 2021-11-25 ENCOUNTER — Other Ambulatory Visit (HOSPITAL_COMMUNITY): Payer: Self-pay

## 2021-11-26 ENCOUNTER — Other Ambulatory Visit (HOSPITAL_COMMUNITY): Payer: Self-pay

## 2021-11-29 ENCOUNTER — Other Ambulatory Visit (HOSPITAL_COMMUNITY): Payer: Self-pay

## 2021-11-29 MED FILL — Cyclobenzaprine HCl Tab 10 MG: ORAL | 90 days supply | Qty: 270 | Fill #0 | Status: AC

## 2021-11-30 ENCOUNTER — Other Ambulatory Visit (HOSPITAL_COMMUNITY): Payer: Self-pay

## 2021-12-01 ENCOUNTER — Other Ambulatory Visit (HOSPITAL_COMMUNITY): Payer: Self-pay

## 2021-12-01 MED ORDER — OZEMPIC (1 MG/DOSE) 4 MG/3ML ~~LOC~~ SOPN
1.0000 mg | PEN_INJECTOR | SUBCUTANEOUS | 3 refills | Status: DC
  Filled 2021-12-01: qty 9, 84d supply, fill #0
  Filled 2022-01-31: qty 3, 28d supply, fill #1

## 2021-12-15 ENCOUNTER — Other Ambulatory Visit (HOSPITAL_COMMUNITY): Payer: Self-pay

## 2021-12-15 MED ORDER — VALSARTAN 320 MG PO TABS
320.0000 mg | ORAL_TABLET | Freq: Every day | ORAL | 0 refills | Status: DC
  Filled 2021-12-15: qty 90, 90d supply, fill #0

## 2021-12-15 MED ORDER — HYDROCHLOROTHIAZIDE 25 MG PO TABS
25.0000 mg | ORAL_TABLET | Freq: Every morning | ORAL | 0 refills | Status: DC
  Filled 2021-12-15: qty 90, 90d supply, fill #0

## 2021-12-21 ENCOUNTER — Other Ambulatory Visit (HOSPITAL_COMMUNITY): Payer: Self-pay

## 2021-12-21 MED ORDER — DEXCOM G6 RECEIVER DEVI
0 refills | Status: DC
  Filled 2021-12-21: qty 3, 30d supply, fill #0
  Filled 2022-01-31: qty 1, 90d supply, fill #0

## 2021-12-21 MED ORDER — DEXCOM G6 RECEIVER DEVI
0 refills | Status: DC
  Filled 2021-12-21 (×3): qty 1, 1d supply, fill #0

## 2021-12-27 ENCOUNTER — Other Ambulatory Visit (HOSPITAL_COMMUNITY): Payer: Self-pay

## 2021-12-27 MED ORDER — DEXCOM G6 RECEIVER DEVI
Freq: Every day | 0 refills | Status: DC
  Filled 2021-12-27: qty 1, 90d supply, fill #0

## 2021-12-28 ENCOUNTER — Other Ambulatory Visit (HOSPITAL_COMMUNITY): Payer: Self-pay

## 2021-12-30 ENCOUNTER — Other Ambulatory Visit (HOSPITAL_COMMUNITY): Payer: Self-pay

## 2022-01-03 ENCOUNTER — Other Ambulatory Visit (HOSPITAL_COMMUNITY): Payer: Self-pay

## 2022-01-03 DIAGNOSIS — I10 Essential (primary) hypertension: Secondary | ICD-10-CM | POA: Diagnosis not present

## 2022-01-03 DIAGNOSIS — E1142 Type 2 diabetes mellitus with diabetic polyneuropathy: Secondary | ICD-10-CM | POA: Diagnosis not present

## 2022-01-03 DIAGNOSIS — E039 Hypothyroidism, unspecified: Secondary | ICD-10-CM | POA: Diagnosis not present

## 2022-01-03 DIAGNOSIS — E782 Mixed hyperlipidemia: Secondary | ICD-10-CM | POA: Diagnosis not present

## 2022-01-03 DIAGNOSIS — M069 Rheumatoid arthritis, unspecified: Secondary | ICD-10-CM | POA: Diagnosis not present

## 2022-01-03 DIAGNOSIS — K219 Gastro-esophageal reflux disease without esophagitis: Secondary | ICD-10-CM | POA: Diagnosis not present

## 2022-01-03 DIAGNOSIS — F324 Major depressive disorder, single episode, in partial remission: Secondary | ICD-10-CM | POA: Diagnosis not present

## 2022-01-03 DIAGNOSIS — G4700 Insomnia, unspecified: Secondary | ICD-10-CM | POA: Diagnosis not present

## 2022-01-03 DIAGNOSIS — F419 Anxiety disorder, unspecified: Secondary | ICD-10-CM | POA: Diagnosis not present

## 2022-01-03 MED ORDER — OMEPRAZOLE 40 MG PO CPDR
40.0000 mg | DELAYED_RELEASE_CAPSULE | Freq: Two times a day (BID) | ORAL | 3 refills | Status: DC
  Filled 2022-01-03: qty 180, 90d supply, fill #0

## 2022-01-03 MED ORDER — LEVOCETIRIZINE DIHYDROCHLORIDE 5 MG PO TABS
5.0000 mg | ORAL_TABLET | Freq: Every evening | ORAL | 3 refills | Status: DC
  Filled 2022-01-03: qty 90, 90d supply, fill #0
  Filled 2022-04-05: qty 90, 90d supply, fill #1
  Filled 2022-09-15: qty 90, 90d supply, fill #2
  Filled 2022-09-27 – 2022-12-22 (×2): qty 90, 90d supply, fill #3

## 2022-01-03 MED ORDER — MONTELUKAST SODIUM 10 MG PO TABS
10.0000 mg | ORAL_TABLET | Freq: Every evening | ORAL | 3 refills | Status: DC
  Filled 2022-01-03: qty 90, 90d supply, fill #0
  Filled 2022-04-05: qty 90, 90d supply, fill #1
  Filled 2022-07-04: qty 90, 90d supply, fill #2
  Filled 2022-09-15 (×3): qty 90, 90d supply, fill #3

## 2022-01-03 MED ORDER — HYDROCHLOROTHIAZIDE 25 MG PO TABS
25.0000 mg | ORAL_TABLET | Freq: Every morning | ORAL | 3 refills | Status: DC
  Filled 2022-01-03: qty 90, 90d supply, fill #0
  Filled 2022-04-05: qty 90, 90d supply, fill #1

## 2022-01-03 MED ORDER — ZOLPIDEM TARTRATE ER 12.5 MG PO TBCR
12.5000 mg | EXTENDED_RELEASE_TABLET | Freq: Every evening | ORAL | 1 refills | Status: DC | PRN
  Filled 2022-01-03 – 2022-01-07 (×3): qty 90, 90d supply, fill #0
  Filled 2022-04-05: qty 90, 90d supply, fill #1

## 2022-01-03 MED ORDER — GABAPENTIN 400 MG PO CAPS
400.0000 mg | ORAL_CAPSULE | Freq: Three times a day (TID) | ORAL | 3 refills | Status: DC
  Filled 2022-01-03: qty 270, 90d supply, fill #0

## 2022-01-03 MED ORDER — CYCLOBENZAPRINE HCL 10 MG PO TABS
10.0000 mg | ORAL_TABLET | Freq: Three times a day (TID) | ORAL | 3 refills | Status: DC
  Filled 2022-01-03: qty 90, 30d supply, fill #0

## 2022-01-03 MED ORDER — DULOXETINE HCL 60 MG PO CPEP
60.0000 mg | ORAL_CAPSULE | Freq: Two times a day (BID) | ORAL | 3 refills | Status: DC
  Filled 2022-01-03: qty 180, 90d supply, fill #0
  Filled 2022-09-26: qty 180, 90d supply, fill #1

## 2022-01-03 MED ORDER — ALBUTEROL SULFATE HFA 108 (90 BASE) MCG/ACT IN AERS
2.0000 | INHALATION_SPRAY | Freq: Four times a day (QID) | RESPIRATORY_TRACT | 6 refills | Status: DC | PRN
  Filled 2022-01-03: qty 18, 25d supply, fill #0
  Filled 2022-01-03: qty 54, 75d supply, fill #0

## 2022-01-03 MED ORDER — VALSARTAN 320 MG PO TABS
320.0000 mg | ORAL_TABLET | Freq: Every day | ORAL | 3 refills | Status: DC
  Filled 2022-01-03 – 2022-06-16 (×3): qty 90, 90d supply, fill #0
  Filled 2022-06-16 – 2022-09-16 (×2): qty 90, 90d supply, fill #1

## 2022-01-03 MED ORDER — ALPRAZOLAM 1 MG PO TABS
1.0000 mg | ORAL_TABLET | Freq: Three times a day (TID) | ORAL | 1 refills | Status: DC | PRN
  Filled 2022-01-03 – 2022-01-07 (×3): qty 270, 90d supply, fill #0
  Filled 2022-03-21 – 2022-04-05 (×2): qty 270, 90d supply, fill #1

## 2022-01-03 MED ORDER — FENOFIBRATE MICRONIZED 134 MG PO CAPS
134.0000 mg | ORAL_CAPSULE | Freq: Every day | ORAL | 3 refills | Status: DC
  Filled 2022-01-03: qty 90, 90d supply, fill #0
  Filled 2022-04-05: qty 90, 90d supply, fill #1
  Filled 2022-07-04: qty 90, 90d supply, fill #2
  Filled 2022-09-26: qty 90, 90d supply, fill #3

## 2022-01-04 ENCOUNTER — Other Ambulatory Visit (HOSPITAL_COMMUNITY): Payer: Self-pay

## 2022-01-04 MED ORDER — CARESTART COVID-19 HOME TEST VI KIT
PACK | 0 refills | Status: DC
  Filled 2022-01-04: qty 4, 4d supply, fill #0

## 2022-01-05 ENCOUNTER — Other Ambulatory Visit (HOSPITAL_COMMUNITY): Payer: Self-pay

## 2022-01-05 MED ORDER — ALBUTEROL SULFATE HFA 108 (90 BASE) MCG/ACT IN AERS
2.0000 | INHALATION_SPRAY | Freq: Four times a day (QID) | RESPIRATORY_TRACT | 3 refills | Status: DC | PRN
  Filled 2022-01-05 – 2022-04-05 (×2): qty 54, 75d supply, fill #0
  Filled 2022-07-04: qty 54, 75d supply, fill #1
  Filled 2022-09-15: qty 20.1, 75d supply, fill #1

## 2022-01-06 ENCOUNTER — Other Ambulatory Visit (HOSPITAL_COMMUNITY): Payer: Self-pay

## 2022-01-06 ENCOUNTER — Ambulatory Visit: Payer: 59 | Admitting: Podiatry

## 2022-01-06 ENCOUNTER — Other Ambulatory Visit: Payer: Self-pay

## 2022-01-06 ENCOUNTER — Ambulatory Visit (INDEPENDENT_AMBULATORY_CARE_PROVIDER_SITE_OTHER): Payer: 59

## 2022-01-06 ENCOUNTER — Encounter: Payer: Self-pay | Admitting: Podiatry

## 2022-01-06 DIAGNOSIS — M2042 Other hammer toe(s) (acquired), left foot: Secondary | ICD-10-CM

## 2022-01-06 DIAGNOSIS — M76821 Posterior tibial tendinitis, right leg: Secondary | ICD-10-CM | POA: Diagnosis not present

## 2022-01-06 DIAGNOSIS — M778 Other enthesopathies, not elsewhere classified: Secondary | ICD-10-CM

## 2022-01-06 MED ORDER — CYCLOBENZAPRINE HCL 5 MG PO TABS
5.0000 mg | ORAL_TABLET | Freq: Three times a day (TID) | ORAL | 1 refills | Status: DC | PRN
  Filled 2022-01-06: qty 30, 10d supply, fill #0

## 2022-01-06 MED ORDER — TRIAMCINOLONE ACETONIDE 40 MG/ML IJ SUSP
20.0000 mg | Freq: Once | INTRAMUSCULAR | Status: AC
  Administered 2022-01-06: 20 mg

## 2022-01-06 NOTE — Patient Instructions (Signed)
Flexor Tenotomy Flexor tenotomy is a surgical procedure to correct a type of toe deformity that results when a foot muscle or tendon pulls too hard on a toe and bends it up at the middle joint (hammer toe). A tendon is the band of tissue that attaches a muscle to a bone. Hammer toe usually affects the second, third, or fourth toe. Flexor tenotomy releases the tendons by cutting the attachment. This allows the toe to be straightened and flattened. You may have this procedure if: Your hammer toe is painful. An ulcer forms on the top of your toe joint or the tip of your toe. You have diabetes and need this procedure to prevent bone infection and toe loss. Tell your health care provider about: Any allergies you have. All medicines you are taking, including vitamins, herbs, eye drops, creams, and over-the-counter medicines. Any problems you or family members have had with anesthetic medicines. Any blood disorders you have. Any surgeries you have had. Any medical conditions you have. Whether you are pregnant or may be pregnant. What are the risks? Generally, this is a safe procedure. However, problems may occur, including: Infection. This is the biggest risk for people with diabetes. Bleeding. Allergic reactions to medicines. Damage to bone, muscle, nerves, or blood vessels near the toe. Failure to correct the deformity. What happens before the procedure? Medicines Ask your health care provider about: Changing or stopping your regular medicines. This is especially important if you are taking diabetes medicines or blood thinners. Taking medicines such as aspirin and ibuprofen. These medicines can thin your blood. Do not take these medicines unless your health care provider tells you to take them. Taking over-the-counter medicines, vitamins, herbs, and supplements. You may be given antibiotic medicines before surgery if you have an infected ulcer. General instructions If you have diabetes, your  blood sugar may be tested before surgery. You may have an X-ray of your foot. Ask your health care provider what steps will be taken to help prevent infection. These steps may include washing skin with a germ-killing soap. What happens during the procedure? You will be given a medicine to numb the area (local anesthetic). This will be injected into your toe. After your toe is numb, an incision will be made underneath your toe, at the base. The procedure will be done using one of the following techniques: The surgical blade may be inserted in your toe to cut the two tendons that attach to the toe (short and long tendons). The toe will then be pulled to straighten it. You may be asked to move your toes. The blade will be removed when the toe is straight and flat. An adhesive bandage will be applied. A slightly longer incision may be made under your toe so your health care provider can see the tendons to cut. After the tendons are cut, a few stitches (sutures) may be used to close the incision. A bandage (dressing) will be placed over the incision. A small toe splint may be placed to keep your toe straight. The procedure may vary among health care providers and hospitals. What happens after the procedure? You will be given instructions for taking care of yourself at home after your procedure. You will be able to walk on your foot after the procedure. You may have to wear a soft, wide shoe to keep pressure off of your toe. Summary Flexor tenotomy is a surgical procedure to correct a type of toe deformity (hammer toe). If you have a hammer toe  deformity, your toe bends up at the middle joint. This happens when a muscle or tendon pulls too hard on the toe. A small incision is made during this procedure to cut (release) the tendons. This may allow the toe to be straightened and flattened. If you have diabetes, this procedure may help prevent ulcers from forming on the hammer toe. Ulcers can become  infected and lead to toe loss. You will be able to walk on your foot after surgery. You may have to wear a wide, soft shoe during recovery. This information is not intended to replace advice given to you by your health care provider. Make sure you discuss any questions you have with your health care provider. Document Revised: 02/06/2020 Document Reviewed: 02/06/2020 Elsevier Patient Education  2022 [employer].

## 2022-01-07 ENCOUNTER — Other Ambulatory Visit (HOSPITAL_COMMUNITY): Payer: Self-pay

## 2022-01-08 NOTE — Progress Notes (Signed)
She presents today concerned about her medial ankle after a fall last month.  She states that she has been trying to get into the office to see [place] and has been unable to do so.  She is also concerned about the flexion of the third toe on the left foot.  States her blood sugars are under better control now with the digital monitoring.  Objective: Vital signs are stable she is alert and oriented x3.  Pulses are palpable.  She has some tenderness along the posterior tibial tendon where we performed a Kidner procedure many years ago.  Radiographs taken today do demonstrate what appears to be an old avulsion area does not demonstrate any type of new trauma at this point in the third toe is flexible but most likely should require a flexor tenotomy to toes #3 and #4 of the left foot.  Assessment: Posterior tibial tendinitis of the right foot which I injected today with dexamethasone and local anesthetic into the tendon sheath making sure not to inject into the tendon itself.  We did schedule her out for about 6 weeks for tenotomy to toes 3 and 4 this will be a flexor tenotomy.

## 2022-01-14 ENCOUNTER — Other Ambulatory Visit (HOSPITAL_COMMUNITY): Payer: Self-pay

## 2022-01-25 ENCOUNTER — Ambulatory Visit: Payer: 59 | Admitting: Podiatry

## 2022-01-27 DIAGNOSIS — M545 Low back pain, unspecified: Secondary | ICD-10-CM | POA: Diagnosis not present

## 2022-01-27 DIAGNOSIS — M62830 Muscle spasm of back: Secondary | ICD-10-CM | POA: Diagnosis not present

## 2022-01-27 DIAGNOSIS — M9901 Segmental and somatic dysfunction of cervical region: Secondary | ICD-10-CM | POA: Diagnosis not present

## 2022-01-27 DIAGNOSIS — M9903 Segmental and somatic dysfunction of lumbar region: Secondary | ICD-10-CM | POA: Diagnosis not present

## 2022-01-27 DIAGNOSIS — M542 Cervicalgia: Secondary | ICD-10-CM | POA: Diagnosis not present

## 2022-01-27 DIAGNOSIS — M546 Pain in thoracic spine: Secondary | ICD-10-CM | POA: Diagnosis not present

## 2022-01-27 DIAGNOSIS — M9902 Segmental and somatic dysfunction of thoracic region: Secondary | ICD-10-CM | POA: Diagnosis not present

## 2022-01-27 DIAGNOSIS — M62838 Other muscle spasm: Secondary | ICD-10-CM | POA: Diagnosis not present

## 2022-01-28 DIAGNOSIS — H524 Presbyopia: Secondary | ICD-10-CM | POA: Diagnosis not present

## 2022-01-28 DIAGNOSIS — H04123 Dry eye syndrome of bilateral lacrimal glands: Secondary | ICD-10-CM | POA: Diagnosis not present

## 2022-01-28 DIAGNOSIS — H40013 Open angle with borderline findings, low risk, bilateral: Secondary | ICD-10-CM | POA: Diagnosis not present

## 2022-01-28 DIAGNOSIS — Z79899 Other long term (current) drug therapy: Secondary | ICD-10-CM | POA: Diagnosis not present

## 2022-01-28 DIAGNOSIS — H40033 Anatomical narrow angle, bilateral: Secondary | ICD-10-CM | POA: Diagnosis not present

## 2022-01-31 ENCOUNTER — Other Ambulatory Visit (HOSPITAL_COMMUNITY): Payer: Self-pay

## 2022-02-01 ENCOUNTER — Other Ambulatory Visit: Payer: Self-pay

## 2022-02-01 ENCOUNTER — Ambulatory Visit: Payer: 59 | Admitting: Podiatry

## 2022-02-01 ENCOUNTER — Other Ambulatory Visit (HOSPITAL_COMMUNITY): Payer: Self-pay

## 2022-02-02 ENCOUNTER — Other Ambulatory Visit: Payer: Self-pay | Admitting: Podiatry

## 2022-02-02 ENCOUNTER — Other Ambulatory Visit (HOSPITAL_COMMUNITY): Payer: Self-pay

## 2022-02-02 ENCOUNTER — Other Ambulatory Visit: Payer: Self-pay

## 2022-02-02 ENCOUNTER — Encounter: Payer: Self-pay | Admitting: Podiatry

## 2022-02-02 MED ORDER — LEVOTHYROXINE SODIUM 50 MCG PO TABS
50.0000 ug | ORAL_TABLET | Freq: Every morning | ORAL | 1 refills | Status: DC
  Filled 2022-02-02 (×2): qty 90, 90d supply, fill #0
  Filled 2022-05-10: qty 90, 90d supply, fill #1

## 2022-02-02 MED ORDER — CYCLOBENZAPRINE HCL 5 MG PO TABS
5.0000 mg | ORAL_TABLET | Freq: Three times a day (TID) | ORAL | 1 refills | Status: DC | PRN
  Filled 2022-02-02: qty 30, 10d supply, fill #0

## 2022-02-02 NOTE — Progress Notes (Unsigned)
cy

## 2022-02-07 ENCOUNTER — Other Ambulatory Visit (HOSPITAL_COMMUNITY): Payer: Self-pay

## 2022-02-07 MED ORDER — CYCLOBENZAPRINE HCL 10 MG PO TABS
10.0000 mg | ORAL_TABLET | Freq: Three times a day (TID) | ORAL | 0 refills | Status: DC
  Filled 2022-02-07 – 2022-02-08 (×2): qty 270, 90d supply, fill #0

## 2022-02-07 MED ORDER — CYCLOBENZAPRINE HCL 10 MG PO TABS
10.0000 mg | ORAL_TABLET | Freq: Three times a day (TID) | ORAL | 0 refills | Status: DC | PRN
  Filled 2022-02-07: qty 90, 30d supply, fill #0

## 2022-02-07 NOTE — Addendum Note (Signed)
Addended by: Rip Harbour on: 02/07/2022 05:25 PM ? ? Modules accepted: Orders ? ?

## 2022-02-08 ENCOUNTER — Other Ambulatory Visit (HOSPITAL_COMMUNITY): Payer: Self-pay

## 2022-02-08 MED ORDER — OZEMPIC (2 MG/DOSE) 8 MG/3ML ~~LOC~~ SOPN
2.0000 mg | PEN_INJECTOR | SUBCUTANEOUS | 6 refills | Status: DC
  Filled 2022-02-08: qty 3, 28d supply, fill #0
  Filled 2022-03-01: qty 9, 84d supply, fill #1

## 2022-02-09 ENCOUNTER — Other Ambulatory Visit (HOSPITAL_COMMUNITY): Payer: Self-pay

## 2022-02-10 ENCOUNTER — Other Ambulatory Visit (HOSPITAL_COMMUNITY): Payer: Self-pay

## 2022-02-14 DIAGNOSIS — M542 Cervicalgia: Secondary | ICD-10-CM | POA: Diagnosis not present

## 2022-02-14 DIAGNOSIS — M9901 Segmental and somatic dysfunction of cervical region: Secondary | ICD-10-CM | POA: Diagnosis not present

## 2022-02-14 DIAGNOSIS — M62838 Other muscle spasm: Secondary | ICD-10-CM | POA: Diagnosis not present

## 2022-02-14 DIAGNOSIS — M9903 Segmental and somatic dysfunction of lumbar region: Secondary | ICD-10-CM | POA: Diagnosis not present

## 2022-02-14 DIAGNOSIS — M62830 Muscle spasm of back: Secondary | ICD-10-CM | POA: Diagnosis not present

## 2022-02-14 DIAGNOSIS — M9902 Segmental and somatic dysfunction of thoracic region: Secondary | ICD-10-CM | POA: Diagnosis not present

## 2022-02-14 DIAGNOSIS — M545 Low back pain, unspecified: Secondary | ICD-10-CM | POA: Diagnosis not present

## 2022-02-14 DIAGNOSIS — M546 Pain in thoracic spine: Secondary | ICD-10-CM | POA: Diagnosis not present

## 2022-02-23 ENCOUNTER — Encounter: Payer: Self-pay | Admitting: Podiatry

## 2022-02-24 ENCOUNTER — Telehealth: Payer: Self-pay | Admitting: *Deleted

## 2022-02-24 ENCOUNTER — Encounter: Payer: Self-pay | Admitting: Podiatry

## 2022-02-24 NOTE — Telephone Encounter (Signed)
Patient is calling because she has injured her foot somehow, very painful, red with bloody splatter patterns on bottom of the foot. She wanting to speak to Dr Milinda Pointer directly, has an appointment on Friday with Dr Jacqualyn Posey but wanted to know if he can see her today. Please advise. ?

## 2022-02-24 NOTE — Telephone Encounter (Signed)
Spoke with patient and she is going to keep her appointment on tomorrow w/ Dr Junious Dresser previously wanted to be seen earlier this morning by Dr Milinda Pointer, is booked for the day, cannot leave work in afternoon.

## 2022-02-25 ENCOUNTER — Ambulatory Visit (INDEPENDENT_AMBULATORY_CARE_PROVIDER_SITE_OTHER): Payer: 59

## 2022-02-25 ENCOUNTER — Other Ambulatory Visit (HOSPITAL_COMMUNITY): Payer: Self-pay

## 2022-02-25 ENCOUNTER — Ambulatory Visit (INDEPENDENT_AMBULATORY_CARE_PROVIDER_SITE_OTHER): Payer: 59 | Admitting: Podiatry

## 2022-02-25 ENCOUNTER — Ambulatory Visit: Payer: 59 | Admitting: Registered Nurse

## 2022-02-25 DIAGNOSIS — M778 Other enthesopathies, not elsewhere classified: Secondary | ICD-10-CM | POA: Diagnosis not present

## 2022-02-25 DIAGNOSIS — L97521 Non-pressure chronic ulcer of other part of left foot limited to breakdown of skin: Secondary | ICD-10-CM | POA: Diagnosis not present

## 2022-02-25 DIAGNOSIS — R21 Rash and other nonspecific skin eruption: Secondary | ICD-10-CM

## 2022-02-25 MED ORDER — TRIAMCINOLONE ACETONIDE 0.025 % EX OINT
1.0000 "application " | TOPICAL_OINTMENT | Freq: Two times a day (BID) | CUTANEOUS | 0 refills | Status: DC
  Filled 2022-02-25: qty 30, 15d supply, fill #0

## 2022-02-25 MED ORDER — CEPHALEXIN 500 MG PO CAPS
500.0000 mg | ORAL_CAPSULE | Freq: Three times a day (TID) | ORAL | 0 refills | Status: DC
  Filled 2022-02-25: qty 21, 7d supply, fill #0

## 2022-02-28 ENCOUNTER — Other Ambulatory Visit (HOSPITAL_COMMUNITY): Payer: Self-pay

## 2022-02-28 NOTE — Progress Notes (Signed)
Subjective: ?56 year old female presents the office today for concerns of discomfort to her left ankle.  She states she woke up on Tuesday and she has swelling as well as discoloration to the medial aspect of the foot.  She denies any recent injury.  She denies being bit by any insects.  She has not seen any drainage or pus.  No fevers or chills.  She is scheduled for flexor tenotomy's next week with Dr. Milinda Pointer. ? ?Objective: ?AAO x3, NAD ?DP/PT pulses palpable bilaterally, CRT less than 3 seconds ?On the medial aspect of the left foot abrasion, skin rash present.  There is no drainage or pus.  No fluctuation or crepitation.  There is no malodor.  Mild tenderness palpation.  No prominence of the hardware that was previously implanted. ?No open lesions or pre-ulcerative lesions.  ?No pain with calf compression, swelling, warmth, erythema ? ? ? ? ?Assessment: ?Skin rash left foot ? ?Plan: ?-All treatment options discussed with the patient including all alternatives, risks, complications.  ?-X-rays obtained reviewed.  3 views of the left foot and ankle were obtained.  No evidence of acute fracture, osteomyelitis.  Hardware intact from prior surgery noted the navicular. ?-Surgical shoe dispensed for offloading to avoid excess pressure.  I prescribed triamcinolone cream to apply topically as well as will start antibiotics and I prescribed cephalexin.  Monitor for any signs or symptoms of infection and call the office or report to emergency department should any occur.  We will follow-up with Dr. Mathews Robinsons as scheduled next week. ?-Patient encouraged to call the office with any questions, concerns, change in symptoms.  ? ?Trula Slade DPM ? ?

## 2022-03-01 ENCOUNTER — Other Ambulatory Visit (HOSPITAL_COMMUNITY): Payer: Self-pay

## 2022-03-02 ENCOUNTER — Other Ambulatory Visit: Payer: Self-pay | Admitting: Podiatry

## 2022-03-02 ENCOUNTER — Other Ambulatory Visit (HOSPITAL_COMMUNITY): Payer: Self-pay

## 2022-03-02 ENCOUNTER — Telehealth: Payer: Self-pay | Admitting: *Deleted

## 2022-03-02 MED ORDER — FLUCONAZOLE 150 MG PO TABS
150.0000 mg | ORAL_TABLET | Freq: Once | ORAL | 0 refills | Status: DC
  Filled 2022-03-02: qty 1, 1d supply, fill #0

## 2022-03-02 NOTE — Telephone Encounter (Signed)
Patient calling to request Diflucan(yeast infection medication that she always has to  take with antibiotics.Please advise. ?

## 2022-03-03 ENCOUNTER — Other Ambulatory Visit (HOSPITAL_COMMUNITY): Payer: Self-pay

## 2022-03-03 ENCOUNTER — Ambulatory Visit: Payer: 59 | Admitting: Podiatry

## 2022-03-03 ENCOUNTER — Encounter: Payer: Self-pay | Admitting: Podiatry

## 2022-03-03 DIAGNOSIS — M205X2 Other deformities of toe(s) (acquired), left foot: Secondary | ICD-10-CM

## 2022-03-03 DIAGNOSIS — M205X9 Other deformities of toe(s) (acquired), unspecified foot: Secondary | ICD-10-CM

## 2022-03-03 MED ORDER — FLUCONAZOLE 150 MG PO TABS
150.0000 mg | ORAL_TABLET | Freq: Once | ORAL | 0 refills | Status: DC
  Filled 2022-03-03 – 2022-03-08 (×2): qty 2, 2d supply, fill #0

## 2022-03-03 NOTE — Progress Notes (Signed)
She presents today for a minor surgical procedure tenotomy flexor third and fourth toes of the left foot.  She denies fever chills nausea vomit muscle aches and pains states she is doing well.  Blood sugars currently 92 mg/dL. ? ?Objective: Vital signs are stable she is alert and oriented x3.  Pulses are palpable.  Flexor deformities to toes #3 #4 of the left foot are flexible in nature. ? 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 ? ?Plan: At this point the toes were localized with 5 cc of 50-50 with Marcaine plain and lidocaine with epinephrine toe at 1:200,000 dilution.  After the toes were prepped and draped in normal sterile fashion a small stab incision was made on the plantar aspect of the toe by the PIPJ and transected the long flexor tendon with a #11 blade.  The area was copiously washed number still saline wiped clean with alcohol.  Dermabond was placed and allowed to dry.  It was then dressed in normal digital dressing and extension.  Will place her in a Darco shoe we will follow-up in 1 week.  She will keep the foot dry and clean for me. ?

## 2022-03-03 NOTE — Patient Instructions (Signed)
Leave bandage in place and dry.We will follow up with you in 1 week for recheck. ? ?

## 2022-03-03 NOTE — Telephone Encounter (Signed)
Patient said that she received the one prescription sent in yesterday by Dr Jacqualyn Posey but will need a second one after completing the antibiotic. Please advise.

## 2022-03-04 NOTE — Telephone Encounter (Signed)
I sent in #2 fluconazole yesterday to the pharmacy ?

## 2022-03-07 DIAGNOSIS — Z79899 Other long term (current) drug therapy: Secondary | ICD-10-CM | POA: Diagnosis not present

## 2022-03-07 DIAGNOSIS — L401 Generalized pustular psoriasis: Secondary | ICD-10-CM | POA: Diagnosis not present

## 2022-03-07 DIAGNOSIS — E669 Obesity, unspecified: Secondary | ICD-10-CM | POA: Diagnosis not present

## 2022-03-07 DIAGNOSIS — M79644 Pain in right finger(s): Secondary | ICD-10-CM | POA: Diagnosis not present

## 2022-03-07 DIAGNOSIS — Z6830 Body mass index (BMI) 30.0-30.9, adult: Secondary | ICD-10-CM | POA: Diagnosis not present

## 2022-03-07 DIAGNOSIS — M0589 Other rheumatoid arthritis with rheumatoid factor of multiple sites: Secondary | ICD-10-CM | POA: Diagnosis not present

## 2022-03-07 DIAGNOSIS — M1991 Primary osteoarthritis, unspecified site: Secondary | ICD-10-CM | POA: Diagnosis not present

## 2022-03-07 DIAGNOSIS — M5136 Other intervertebral disc degeneration, lumbar region: Secondary | ICD-10-CM | POA: Diagnosis not present

## 2022-03-08 ENCOUNTER — Other Ambulatory Visit (HOSPITAL_COMMUNITY): Payer: Self-pay

## 2022-03-08 ENCOUNTER — Other Ambulatory Visit: Payer: Self-pay

## 2022-03-09 DIAGNOSIS — G609 Hereditary and idiopathic neuropathy, unspecified: Secondary | ICD-10-CM | POA: Diagnosis not present

## 2022-03-09 DIAGNOSIS — E1165 Type 2 diabetes mellitus with hyperglycemia: Secondary | ICD-10-CM | POA: Diagnosis not present

## 2022-03-09 DIAGNOSIS — E049 Nontoxic goiter, unspecified: Secondary | ICD-10-CM | POA: Diagnosis not present

## 2022-03-09 DIAGNOSIS — Z23 Encounter for immunization: Secondary | ICD-10-CM | POA: Diagnosis not present

## 2022-03-09 DIAGNOSIS — E7800 Pure hypercholesterolemia, unspecified: Secondary | ICD-10-CM | POA: Diagnosis not present

## 2022-03-09 DIAGNOSIS — I10 Essential (primary) hypertension: Secondary | ICD-10-CM | POA: Diagnosis not present

## 2022-03-09 DIAGNOSIS — M069 Rheumatoid arthritis, unspecified: Secondary | ICD-10-CM | POA: Diagnosis not present

## 2022-03-09 DIAGNOSIS — E039 Hypothyroidism, unspecified: Secondary | ICD-10-CM | POA: Diagnosis not present

## 2022-03-10 ENCOUNTER — Ambulatory Visit: Payer: 59 | Admitting: Podiatry

## 2022-03-10 ENCOUNTER — Encounter: Payer: Self-pay | Admitting: Podiatry

## 2022-03-10 DIAGNOSIS — Z79899 Other long term (current) drug therapy: Secondary | ICD-10-CM | POA: Insufficient documentation

## 2022-03-10 DIAGNOSIS — L409 Psoriasis, unspecified: Secondary | ICD-10-CM | POA: Insufficient documentation

## 2022-03-10 DIAGNOSIS — M1991 Primary osteoarthritis, unspecified site: Secondary | ICD-10-CM | POA: Insufficient documentation

## 2022-03-10 DIAGNOSIS — M7130 Other bursal cyst, unspecified site: Secondary | ICD-10-CM | POA: Insufficient documentation

## 2022-03-10 DIAGNOSIS — Z9889 Other specified postprocedural states: Secondary | ICD-10-CM

## 2022-03-10 DIAGNOSIS — M205X9 Other deformities of toe(s) (acquired), unspecified foot: Secondary | ICD-10-CM

## 2022-03-10 DIAGNOSIS — M79609 Pain in unspecified limb: Secondary | ICD-10-CM | POA: Insufficient documentation

## 2022-03-10 DIAGNOSIS — M5136 Other intervertebral disc degeneration, lumbar region: Secondary | ICD-10-CM | POA: Insufficient documentation

## 2022-03-10 DIAGNOSIS — E663 Overweight: Secondary | ICD-10-CM | POA: Insufficient documentation

## 2022-03-10 NOTE — Progress Notes (Signed)
She presents today 1 week status post flexor tenotomy's #3 #4 of the left foot.  She states that they were burning but now they are doing much better.  States that she has kept her foot dry and clean all week. ? ?Objective: Vital signs are stable alert oriented x3 there is no erythema edema cellulitis drainage or odor.  Toes are sitting rectus.  Incision margins and on the heel uneventfully. ? ?Assessment: Well-healing surgical toes.  No signs of infection. ? ?Plan: Follow-up with me on an as-needed basis may get back to her regular activities. ?

## 2022-03-11 ENCOUNTER — Encounter: Payer: 59 | Attending: Registered Nurse | Admitting: Registered Nurse

## 2022-03-11 ENCOUNTER — Encounter: Payer: Self-pay | Admitting: Registered Nurse

## 2022-03-11 ENCOUNTER — Other Ambulatory Visit: Payer: Self-pay

## 2022-03-11 VITALS — BP 131/84 | HR 94 | Ht 69.0 in | Wt 203.4 lb

## 2022-03-11 DIAGNOSIS — Z5181 Encounter for therapeutic drug level monitoring: Secondary | ICD-10-CM | POA: Insufficient documentation

## 2022-03-11 DIAGNOSIS — Z79899 Other long term (current) drug therapy: Secondary | ICD-10-CM | POA: Insufficient documentation

## 2022-03-11 DIAGNOSIS — G894 Chronic pain syndrome: Secondary | ICD-10-CM | POA: Diagnosis not present

## 2022-03-11 DIAGNOSIS — G5793 Unspecified mononeuropathy of bilateral lower limbs: Secondary | ICD-10-CM | POA: Diagnosis not present

## 2022-03-11 MED ORDER — TRAMADOL HCL 50 MG PO TABS
50.0000 mg | ORAL_TABLET | Freq: Three times a day (TID) | ORAL | 1 refills | Status: DC | PRN
  Filled 2022-03-11: qty 270, 90d supply, fill #0

## 2022-03-11 NOTE — Progress Notes (Signed)
? ?Subjective:  ? ? Patient ID: Amy Adams, female    DOB: Jan 03, 1966, 56 y.o.   MRN: [id number] ? ?HPI: Amy Adams is a 56 y.o. female who returns for follow up appointment for chronic pain and medication refill. She states her  pain is located in her left foot she describes the pain as burning pain. She rates her pain 2. Her current exercise regime is walking  ? ?Ms. Crite Morphine equivalent is 15.00 MME.  She is also prescribed Alprazolam  by Dr. Kenton Kingfisher .We have discussed the black box warning of using opioids and benzodiazepines. I highlighted the dangers of using these drugs together and discussed the adverse events including respiratory suppression, overdose, cognitive impairment and importance of compliance with current regimen. We will continue to monitor and adjust as indicated.  ? ?Last UDS was performed on 08/12/2022, it was consistent.  ?  ? ?Pain Inventory ?Average Pain 2 ?Pain Right Now 2 ?My pain is constant, burning, and tingling ? ?In the last 24 hours, has pain interfered with the following? ?General activity 0 ?Relation with others 0 ?Enjoyment of life 0 ?What TIME of day is your pain at its worst? night ?Sleep (in general) Poor ? ?Pain is worse with:  other ?Pain improves with:  other ?Relief from Meds: 5 ? ?Family History  ?Problem Relation Age of Onset  ? Diabetes Mother   ? Migraines Mother   ? COPD Father   ? Cancer Father   ? Diabetes Sister   ? ?Social History  ? ?Socioeconomic History  ? Marital status: Divorced  ?  Spouse name: Not on file  ? Number of children: Not on file  ? Years of education: Not on file  ? Highest education level: Not on file  ?Occupational History  ? Not on file  ?Tobacco Use  ? Smoking status: Never  ? Smokeless tobacco: Never  ?Vaping Use  ? Vaping Use: Never used  ?Substance and Sexual Activity  ? Alcohol use: No  ? Drug use: Never  ? Sexual activity: Not on file  ?Other Topics Concern  ? Not on file  ?Social History Narrative  ? Not on file   ? ?Social Determinants of Health  ? ?Financial Resource Strain: Not on file  ?Food Insecurity: Not on file  ?Transportation Needs: Not on file  ?Physical Activity: Not on file  ?Stress: Not on file  ?Social Connections: Not on file  ? ?Past Surgical History:  ?Procedure Laterality Date  ? ABDOMINAL HYSTERECTOMY  1990  ? with Right Salpingo--ophorectomy  ? ANAL SPHINCTEROTOMY  01-05-2006    dr Rise Patience '@[facility]   ? BUNIONECTOMY  09-25-2009   dr Milinda Pointer '@[facility]   ? right great toe  ? CARPAL TUNNEL RELEASE Bilateral 1996;  1997  ? [place]  ? FOOT SURGERY  12/ 2014   dr Milinda Pointer  ? left 2nd hammertoe repair,  left 2nd metatarsal matrixectomy, left heel endoscopic plantar fasiotomy  ? IR CATHETER TUBE CHANGE  01/01/2019  ? IR RADIOLOGIST EVAL & MGMT  11/28/2018  ? IR RADIOLOGIST EVAL & MGMT  11/29/2018  ? IR RADIOLOGIST EVAL & MGMT  12/06/2018  ? IR RADIOLOGIST EVAL & MGMT  12/25/2018  ? IR RADIOLOGIST EVAL & MGMT  01/08/2019  ? IR RADIOLOGIST EVAL & MGMT  01/29/2019  ? PANNICULECTOMY N/A 10/26/2018  ? Procedure: PANNICULECTOMY;  Surgeon: Johnathan Hausen, MD;  Location: [facility];  Service: General;  Laterality: N/A;  ? TENDON REPAIR  09/2015  ?  left posterior tibial and peroneal tendon repairs  ? TOE AMPUTATION  01/2015  ? right 2nd toe  ? TONSILLECTOMY  age 89  ? TRANSOBTURATOR SLING  01-08-2004   dr Jeffie Pollock '@[facility]'$   ? ?Past Surgical History:  ?Procedure Laterality Date  ? ABDOMINAL HYSTERECTOMY  1990  ? with Right Salpingo--ophorectomy  ? ANAL SPHINCTEROTOMY  01-05-2006    dr Rise Patience '@[facility]   ? BUNIONECTOMY  09-25-2009   dr Milinda Pointer '@[facility]   ? right great toe  ? CARPAL TUNNEL RELEASE Bilateral 1996;  1997  ? [place]  ? FOOT SURGERY  12/ 2014   dr Milinda Pointer  ? left 2nd hammertoe repair,  left 2nd metatarsal matrixectomy, left heel endoscopic plantar fasiotomy  ? IR CATHETER TUBE CHANGE  01/01/2019  ? IR RADIOLOGIST EVAL & MGMT  11/28/2018  ? IR RADIOLOGIST EVAL & MGMT  11/29/2018  ? IR RADIOLOGIST EVAL & MGMT  12/06/2018  ?  IR RADIOLOGIST EVAL & MGMT  12/25/2018  ? IR RADIOLOGIST EVAL & MGMT  01/08/2019  ? IR RADIOLOGIST EVAL & MGMT  01/29/2019  ? PANNICULECTOMY N/A 10/26/2018  ? Procedure: PANNICULECTOMY;  Surgeon: Johnathan Hausen, MD;  Location: [facility];  Service: General;  Laterality: N/A;  ? TENDON REPAIR  09/2015  ? left posterior tibial and peroneal tendon repairs  ? TOE AMPUTATION  01/2015  ? right 2nd toe  ? TONSILLECTOMY  age 21  ? TRANSOBTURATOR SLING  01-08-2004   dr Jeffie Pollock '@[facility]   ? ?Past Medical History:  ?Diagnosis Date  ? Anxiety   ? Arthritis   ? Chronic foot pain, left   ? GERD (gastroesophageal reflux disease)   ? History of anal fissures   ? Hyperlipidemia   ? Hypertension   ? Hypothyroidism   ? Insulin dependent type 2 diabetes mellitus ([place])   ? endrocrinologist-- dr Chalmers Cater  ? Migraines   ? Peripheral neuropathy   ? RA (rheumatoid arthritis) ([place])   ? rheumatologist-  dr Amil Amen  ? SUI (stress urinary incontinence, female)   ? Wears glasses   ? ?BP 131/84   Pulse 94   Ht '5\' 9"'$  (1.753 m)   Wt 203 lb 6.4 oz (92.3 kg)   SpO2 98%   BMI 30.04 kg/m?  ? ?Opioid Risk Score:   ?Fall Risk Score:  `1 ? ?Depression screen PHQ 2/9 ? ? ?  03/11/2022  ?  1:04 PM 09/02/2021  ?  9:14 AM 08/12/2021  ?  2:31 PM 12/23/2019  ?  8:13 AM 04/03/2019  ?  9:01 AM 01/11/2018  ?  5:20 PM 12/29/2017  ?  1:31 PM  ?Depression screen PHQ 2/9  ?Decreased Interest 0 0 0 0 '1 1 1  '$ ?Down, Depressed, Hopeless 0 0 1 0 0 2 2  ?PHQ - 2 Score 0 0 1 0 '1 3 3  '$ ?Altered sleeping   3      ?Tired, decreased energy   1      ?Change in appetite   3      ?Feeling bad or failure about yourself    0      ?Trouble concentrating   0      ?Moving slowly or fidgety/restless   0      ?Suicidal thoughts   0      ?PHQ-9 Score   8      ?Difficult doing work/chores   Not difficult at all      ?  ? ?Review of Systems  ?Constitutional: Negative.   ?  HENT: Negative.    ?Eyes: Negative.   ?Respiratory: Negative.    ?Cardiovascular: Negative.   ?Gastrointestinal: Negative.   ?Endocrine:  Negative.   ?Genitourinary: Negative.   ?Musculoskeletal:  Positive for back pain and gait problem.  ?Skin: Negative.   ?Allergic/Immunologic: Negative.   ?Neurological:   ?     Burning and tingling  ?Hematological: Negative.   ?Psychiatric/Behavioral: Negative.    ?All other systems reviewed and are negative. ? ?   ?Objective:  ? Physical Exam ?Vitals and nursing note reviewed.  ?Constitutional:   ?   Appearance: Normal appearance.  ?Cardiovascular:  ?   Rate and Rhythm: Normal rate and regular rhythm.  ?   Pulses: Normal pulses.  ?   Heart sounds: Normal heart sounds.  ?Pulmonary:  ?   Effort: Pulmonary effort is normal.  ?   Breath sounds: Normal breath sounds.  ?Musculoskeletal:  ?   Cervical back: Normal range of motion and neck supple.  ?   Comments: Normal Muscle Bulk and Muscle Testing Reveals:  ?Upper Extremities: Full ROM and Muscle Strength 5/5  ?Lower Extremities: Full ROM and Muscle Strength 5/5 ?Left Lower Extremity Flexion Produces Pain into her Left Foot ?Arises from Chair with ease ?Narrow Based  Gait  ?   ?Skin: ?   General: Skin is warm and dry.  ?Neurological:  ?   Mental Status: She is alert and oriented to person, place, and time.  ?Psychiatric:     ?   Mood and Affect: Mood normal.     ?   Behavior: Behavior normal.  ? ? ? ? ?   ?Assessment & Plan:  ?Chronic Bilateral Low Back Pain without Sciatica: No complaints today. Continue HEP as Tolerated. Continue current medication regimen. Continue to monitor. 03/11/2022 ?Painful Diabetic Neuropathy: Refilled: Tramadol 50 mg TID as needed for pain #270. Dr Letta Pate agrees with three month supply. Continue Gabapentin.03/11/2022 ?We will continue the opioid monitoring program, this consists of regular clinic visits, examinations, urine drug screen, pill counts as well as use of [place] Controlled Substance Reporting system. A 12 month History has been reviewed on the [place] Controlled Substance Reporting System on 03/11/2022 ?  ?F/U in  6 months ?  ? ?

## 2022-03-16 ENCOUNTER — Other Ambulatory Visit: Payer: Self-pay

## 2022-03-17 ENCOUNTER — Other Ambulatory Visit: Payer: Self-pay

## 2022-03-21 ENCOUNTER — Other Ambulatory Visit: Payer: Self-pay

## 2022-03-21 ENCOUNTER — Telehealth: Payer: Self-pay | Admitting: *Deleted

## 2022-03-21 NOTE — Telephone Encounter (Signed)
Patient is calling because her foot has been bothering her all weekend long, unable to sleep, extreme burning with pain, like an electric shock and the flexeril is not helping. She will unavailable from 1-5 today. Please advise. ?

## 2022-03-22 NOTE — Telephone Encounter (Signed)
Called patient giving physician's recommendations /instructions, said that she has already schedule an appointment w/ Dr Jacqualyn Posey for next week, will try adding another tablet of the gabapentin but wanted to let Dr Milinda Pointer know what is going on as well. ?

## 2022-03-22 NOTE — Telephone Encounter (Signed)
Returned the call back to patient, no answer, could not leave message, mailbox full.

## 2022-03-29 ENCOUNTER — Other Ambulatory Visit (HOSPITAL_COMMUNITY): Payer: Self-pay

## 2022-03-29 ENCOUNTER — Ambulatory Visit: Payer: 59 | Admitting: Podiatry

## 2022-03-29 ENCOUNTER — Other Ambulatory Visit: Payer: Self-pay | Admitting: Podiatry

## 2022-03-29 DIAGNOSIS — L02612 Cutaneous abscess of left foot: Secondary | ICD-10-CM

## 2022-03-29 DIAGNOSIS — R21 Rash and other nonspecific skin eruption: Secondary | ICD-10-CM | POA: Diagnosis not present

## 2022-03-29 DIAGNOSIS — L03032 Cellulitis of left toe: Secondary | ICD-10-CM

## 2022-03-29 MED ORDER — CEPHALEXIN 500 MG PO CAPS
500.0000 mg | ORAL_CAPSULE | Freq: Three times a day (TID) | ORAL | 0 refills | Status: DC
  Filled 2022-03-29: qty 21, 7d supply, fill #0

## 2022-03-29 NOTE — Patient Instructions (Signed)
You can try theraworx for the cramping  ?

## 2022-03-30 ENCOUNTER — Other Ambulatory Visit (HOSPITAL_COMMUNITY): Payer: Self-pay

## 2022-03-30 MED ORDER — FLUCONAZOLE 150 MG PO TABS
150.0000 mg | ORAL_TABLET | Freq: Once | ORAL | 0 refills | Status: AC
  Filled 2022-03-30: qty 2, 2d supply, fill #0

## 2022-03-31 ENCOUNTER — Other Ambulatory Visit (HOSPITAL_COMMUNITY): Payer: Self-pay

## 2022-04-01 ENCOUNTER — Other Ambulatory Visit: Payer: Self-pay | Admitting: Podiatry

## 2022-04-01 ENCOUNTER — Telehealth: Payer: Self-pay | Admitting: *Deleted

## 2022-04-01 MED ORDER — AMOXICILLIN-POT CLAVULANATE 875-125 MG PO TABS
1.0000 | ORAL_TABLET | Freq: Two times a day (BID) | ORAL | 0 refills | Status: DC
Start: 2022-04-01 — End: 2022-07-07

## 2022-04-01 NOTE — Telephone Encounter (Signed)
Patient is calling with concerns about her toe, is burning like hot fire/cold, is taking the antibiotic, should she wait until next appointment, was recently seen on May 16 th. Please advise.

## 2022-04-02 NOTE — Progress Notes (Signed)
Subjective: 56 year old female presents the office today for concerns of burning pain to her toes on the third and fourth toe after she underwent flexor tenotomy with Dr. Milinda Pointer.  She has not seen any swelling or redness or any drainage.  She did call the office apparently and was told to take an extra gabapentin which she has been doing without significant improvement.  She denies any fevers or chills.  Also she reports the rash in the left ankle started to come back.  Did resolve previously.  She has no other concerns.   Objective: AAO x3, NAD DP/PT pulses palpable bilaterally, CRT less than 3 seconds On the plantar aspect left fourth toe is thick hyperkeratotic tissue.  Is able to debride this and this is superficial wound present bleeding occurred but there is no purulence.  There is localized edema and erythema to the area.  There is no fluctuation crepitation.  No malodor.  Small flat erythematous rash noted on the medial aspect ankle.  There is no drainage or pus.  No edema, erythema otherwise.  No open lesion. No pain with calf compression, swelling, warmth, erythema  Assessment: Localized infection status post flexor tenotomy  Plan: -All treatment options discussed with the patient including all alternatives, risks, complications.  -I sharply debrided the hyperkeratotic tissue with underlying wound today.  Bleeding did occur and hemostasis achieved.  Antibiotic ointment and a bandage applied.  I prescribed Keflex. -Continue gabapentin.  -Monitor for any clinical signs or symptoms of infection and directed to call the office immediately should any occur or go to the ER. -Patient encouraged to call the office with any questions, concerns, change in symptoms.   *X-ray left foot next appointment  Trula Slade DPM

## 2022-04-04 ENCOUNTER — Other Ambulatory Visit (HOSPITAL_COMMUNITY): Payer: Self-pay

## 2022-04-04 MED ORDER — MOUNJARO 7.5 MG/0.5ML ~~LOC~~ SOAJ
7.5000 mg | SUBCUTANEOUS | 6 refills | Status: DC
  Filled 2022-04-04: qty 2, 28d supply, fill #0
  Filled 2022-05-25: qty 2, 28d supply, fill #1

## 2022-04-05 ENCOUNTER — Other Ambulatory Visit (HOSPITAL_COMMUNITY): Payer: Self-pay

## 2022-04-05 MED ORDER — MOUNJARO 7.5 MG/0.5ML ~~LOC~~ SOAJ
SUBCUTANEOUS | 4 refills | Status: DC
  Filled 2022-04-05: qty 6, 84d supply, fill #0

## 2022-04-06 ENCOUNTER — Other Ambulatory Visit (HOSPITAL_COMMUNITY): Payer: Self-pay

## 2022-04-06 MED ORDER — METFORMIN HCL ER 500 MG PO TB24
ORAL_TABLET | ORAL | 5 refills | Status: DC
  Filled 2022-04-06: qty 360, 90d supply, fill #0

## 2022-04-07 ENCOUNTER — Ambulatory Visit: Payer: 59 | Admitting: Podiatry

## 2022-04-07 DIAGNOSIS — M722 Plantar fascial fibromatosis: Secondary | ICD-10-CM | POA: Diagnosis not present

## 2022-04-10 NOTE — Progress Notes (Signed)
She presents today for follow-up of her infection to her surgical toe where tenotomy was performed.  States that it seems to be doing okay.  She also presents for a Planter fasciitis.  States that her left foot is starting to act up again.  Objective: Vital signs are stable she is alert and oriented x3 pulses are palpable.  Still retains some neuropathy.  Pain on palpation medial calcaneal tubercle of the left heel.  Fourth toe left foot demonstrates no erythema edema salines drainage or odor no purulence.  Assessment plan fasciitis left resolved infection to the fourth toe.  Plan: Reinjected her left heel today 20 mg Kenalog 5 mg Marcaine point maximal tenderness.  She will continue to watch her blood sugars closely due to the steroid injection.

## 2022-04-12 ENCOUNTER — Other Ambulatory Visit (HOSPITAL_COMMUNITY): Payer: Self-pay

## 2022-04-15 ENCOUNTER — Other Ambulatory Visit (HOSPITAL_COMMUNITY): Payer: Self-pay

## 2022-04-25 ENCOUNTER — Other Ambulatory Visit (HOSPITAL_COMMUNITY): Payer: Self-pay

## 2022-04-25 MED ORDER — DEXCOM G7 SENSOR MISC
4 refills | Status: DC
  Filled 2022-04-25: qty 9, 90d supply, fill #0
  Filled 2022-05-11 (×2): qty 3, 28d supply, fill #0
  Filled 2022-06-16 – 2022-06-21 (×2): qty 3, 28d supply, fill #1

## 2022-04-25 MED ORDER — DEXCOM G7 RECEIVER DEVI
0 refills | Status: DC
  Filled 2022-04-25: qty 1, 90d supply, fill #0

## 2022-04-25 MED ORDER — MOUNJARO 10 MG/0.5ML ~~LOC~~ SOAJ
10.0000 mg | SUBCUTANEOUS | 4 refills | Status: DC
Start: 2022-04-25 — End: 2022-07-07
  Filled 2022-04-25: qty 6, 84d supply, fill #0

## 2022-04-26 ENCOUNTER — Other Ambulatory Visit (HOSPITAL_COMMUNITY): Payer: Self-pay

## 2022-04-28 ENCOUNTER — Other Ambulatory Visit (HOSPITAL_COMMUNITY): Payer: Self-pay

## 2022-05-10 ENCOUNTER — Ambulatory Visit: Payer: 59 | Admitting: Podiatry

## 2022-05-10 ENCOUNTER — Encounter: Payer: Self-pay | Admitting: Podiatry

## 2022-05-10 ENCOUNTER — Other Ambulatory Visit (HOSPITAL_COMMUNITY): Payer: Self-pay

## 2022-05-10 DIAGNOSIS — M778 Other enthesopathies, not elsewhere classified: Secondary | ICD-10-CM | POA: Diagnosis not present

## 2022-05-10 DIAGNOSIS — R21 Rash and other nonspecific skin eruption: Secondary | ICD-10-CM | POA: Diagnosis not present

## 2022-05-10 MED ORDER — TRIAMCINOLONE ACETONIDE 0.1 % EX CREA
1.0000 | TOPICAL_CREAM | Freq: Two times a day (BID) | CUTANEOUS | 0 refills | Status: DC
  Filled 2022-05-10: qty 30, 15d supply, fill #0

## 2022-05-10 MED ORDER — DEXAMETHASONE SODIUM PHOSPHATE 120 MG/30ML IJ SOLN
2.0000 mg | Freq: Once | INTRAMUSCULAR | Status: AC
  Administered 2022-05-10: 2 mg via INTRA_ARTICULAR

## 2022-05-11 ENCOUNTER — Other Ambulatory Visit (HOSPITAL_COMMUNITY): Payer: Self-pay

## 2022-05-12 ENCOUNTER — Other Ambulatory Visit (HOSPITAL_COMMUNITY): Payer: Self-pay

## 2022-05-25 ENCOUNTER — Other Ambulatory Visit (HOSPITAL_COMMUNITY): Payer: Self-pay

## 2022-05-26 ENCOUNTER — Other Ambulatory Visit (HOSPITAL_COMMUNITY): Payer: Self-pay

## 2022-05-26 ENCOUNTER — Other Ambulatory Visit: Payer: Self-pay

## 2022-05-26 MED ORDER — LEVOTHYROXINE SODIUM 50 MCG PO TABS
50.0000 ug | ORAL_TABLET | Freq: Every morning | ORAL | 1 refills | Status: DC
  Filled 2022-05-26: qty 90, 90d supply, fill #0

## 2022-05-31 ENCOUNTER — Other Ambulatory Visit (HOSPITAL_COMMUNITY): Payer: Self-pay

## 2022-06-08 ENCOUNTER — Other Ambulatory Visit: Payer: Self-pay

## 2022-06-08 DIAGNOSIS — Z23 Encounter for immunization: Secondary | ICD-10-CM | POA: Diagnosis not present

## 2022-06-08 MED ORDER — MOUNJARO 12.5 MG/0.5ML ~~LOC~~ SOAJ
SUBCUTANEOUS | 0 refills | Status: DC
  Filled 2022-06-08: qty 6, 84d supply, fill #0
  Filled 2022-06-16: qty 6, fill #0

## 2022-06-08 MED ORDER — MOUNJARO 10 MG/0.5ML ~~LOC~~ SOAJ
SUBCUTANEOUS | 4 refills | Status: DC
  Filled 2022-06-08 – 2022-06-21 (×3): qty 6, 84d supply, fill #0

## 2022-06-09 ENCOUNTER — Other Ambulatory Visit: Payer: Self-pay

## 2022-06-09 MED ORDER — MOUNJARO 12.5 MG/0.5ML ~~LOC~~ SOAJ
SUBCUTANEOUS | 0 refills | Status: DC
  Filled 2022-06-16: qty 2, 24d supply, fill #0
  Filled 2022-06-16 – 2022-06-21 (×2): qty 6, 84d supply, fill #0
  Filled 2022-07-04: qty 2, 28d supply, fill #0
  Filled 2022-07-28: qty 2, 28d supply, fill #1

## 2022-06-16 ENCOUNTER — Other Ambulatory Visit: Payer: Self-pay

## 2022-06-21 ENCOUNTER — Ambulatory Visit: Payer: 59 | Admitting: Podiatry

## 2022-06-21 ENCOUNTER — Encounter: Payer: Self-pay | Admitting: Podiatry

## 2022-06-21 ENCOUNTER — Other Ambulatory Visit (HOSPITAL_COMMUNITY): Payer: Self-pay

## 2022-06-21 DIAGNOSIS — G5792 Unspecified mononeuropathy of left lower limb: Secondary | ICD-10-CM | POA: Diagnosis not present

## 2022-06-21 DIAGNOSIS — R21 Rash and other nonspecific skin eruption: Secondary | ICD-10-CM | POA: Diagnosis not present

## 2022-06-21 NOTE — Progress Notes (Signed)
She presents today for follow-up of her rash to the medial aspect of her left foot and small nodule just beneath her fourth toe on her left foot.  We injected the last time she was in and she states that it really has not gotten any better over the past 6 weeks.  Objective: Vital signs are stable alert and oriented x3.  There is no erythema edema salines drainage odor she does have the beginning of what appears to be a rash to the medial ankle it is nonraised is nonvesicular is not palpable.  However she does have a palpable mass to the plantar aspect of the proximal phalanx fourth toe left foot.  It measures about 5 mm in diameter and is very tender and radiates pain on palpation.  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  Assessment digital neuroma fourth toe left foot.  Venous stasis with a rash medial ankle left.  Last hemoglobin A1c was at 6.8.  Plan: Discussed etiology pathology and surgical therapies at this point time consented her today for a excision soft tissue mass neuroma fourth toe left foot.  She understands this is amenable to it she understands also possible side effects and complications were discussed in great detail today.  Follow-up with her in the near future for excision of this lesion.

## 2022-06-28 ENCOUNTER — Telehealth: Payer: Self-pay

## 2022-06-28 NOTE — Telephone Encounter (Signed)
Amy Adams would like to hold off on surgery till the beginning of next year so she will have money for [facility] and Anesthesia. She wants to know if holding off is going to put her at risk of loosing that toe. She requested for Caryl Pina to call and let her know.

## 2022-07-04 ENCOUNTER — Other Ambulatory Visit (HOSPITAL_COMMUNITY): Payer: Self-pay

## 2022-07-04 MED ORDER — HYDROXYCHLOROQUINE SULFATE 200 MG PO TABS
400.0000 mg | ORAL_TABLET | Freq: Every day | ORAL | 3 refills | Status: DC
  Filled 2022-07-04: qty 180, 90d supply, fill #0
  Filled 2022-08-17: qty 175, 88d supply, fill #0
  Filled 2022-08-17: qty 5, 2d supply, fill #0
  Filled 2022-11-09: qty 180, 90d supply, fill #1
  Filled 2023-03-08: qty 180, 90d supply, fill #2
  Filled 2023-06-05: qty 180, 90d supply, fill #3

## 2022-07-04 MED ORDER — ALBUTEROL SULFATE HFA 108 (90 BASE) MCG/ACT IN AERS
2.0000 | INHALATION_SPRAY | Freq: Four times a day (QID) | RESPIRATORY_TRACT | 2 refills | Status: DC | PRN
  Filled 2022-07-04: qty 20.1, 75d supply, fill #0

## 2022-07-05 ENCOUNTER — Other Ambulatory Visit (HOSPITAL_COMMUNITY): Payer: Self-pay

## 2022-07-05 MED ORDER — ALPRAZOLAM 1 MG PO TABS
1.0000 mg | ORAL_TABLET | Freq: Three times a day (TID) | ORAL | 1 refills | Status: DC | PRN
  Filled 2022-07-05: qty 270, 90d supply, fill #0
  Filled 2022-09-27 – 2022-12-28 (×2): qty 270, 90d supply, fill #1

## 2022-07-05 MED ORDER — CYCLOBENZAPRINE HCL 10 MG PO TABS
10.0000 mg | ORAL_TABLET | Freq: Three times a day (TID) | ORAL | 0 refills | Status: DC
  Filled 2022-07-05: qty 270, 90d supply, fill #0

## 2022-07-05 MED ORDER — ALBUTEROL SULFATE HFA 108 (90 BASE) MCG/ACT IN AERS
2.0000 | INHALATION_SPRAY | Freq: Four times a day (QID) | RESPIRATORY_TRACT | 1 refills | Status: DC | PRN
  Filled 2022-07-05: qty 20.1, 75d supply, fill #0

## 2022-07-05 NOTE — Progress Notes (Unsigned)
Cardiology Office Note:    Date:  07/07/2022   ID:  Amy Adams, Amy Adams [birth year], MRN [id number]  PCP:  Shirline Frees, [place] Providers Cardiologist:  None     Referring MD: Shirline Frees, MD   Chief Complaint  Patient presents with   Chest Pain   Palpitations    History of Present Illness:    Amy Adams is a 56 y.o. female seen at the request of Dr Kenton Kingfisher for evaluation of chest pain and palpitations. She has a history of DM, HTN and HLD. Known RA. He was evaluated in the past with Dr Johnsie Cancel and had unremarkable Stress Myoview and Echo studies in 2017.   She reports that she has been experiencing more palpitations. Describes this as a fluttering sensation. Worse since her husband passed away a year ago. She has also had some left anterior chest pain at times. Nonexertional. Has a sharp shooting pain in her left neck and numbness in her left arm. States she had PVCs in 2017 but this improved with cutting out caffeine. She is active and walks regularly. Is going to need another operation on her toe.  Past Medical History:  Diagnosis Date   Anxiety    Arthritis    Chronic foot pain, left    GERD (gastroesophageal reflux disease)    History of anal fissures    Hyperlipidemia    Hypertension    Hypothyroidism    Insulin dependent type 2 diabetes mellitus ([place])    endrocrinologist-- dr Chalmers Cater   Migraines    Peripheral neuropathy    RA (rheumatoid arthritis) [facility])    rheumatologist-  dr Amil Amen   SUI (stress urinary incontinence, female)    Wears glasses     Past Surgical History:  Procedure Laterality Date   ABDOMINAL HYSTERECTOMY  1990   with Right Salpingo--ophorectomy   ANAL SPHINCTEROTOMY  01-05-2006    dr Rise Patience '@[facility]    BUNIONECTOMY  09-25-2009   dr Milinda Pointer '@[facility]    right great toe   CARPAL TUNNEL RELEASE Bilateral 1996;  [place]  12/ 2014   dr Milinda Pointer   left 2nd hammertoe repair,  left 2nd  metatarsal matrixectomy, left heel endoscopic plantar fasiotomy   IR CATHETER TUBE CHANGE  01/01/2019   IR RADIOLOGIST EVAL & MGMT  11/28/2018   IR RADIOLOGIST EVAL & MGMT  11/29/2018   IR RADIOLOGIST EVAL & MGMT  12/06/2018   IR RADIOLOGIST EVAL & MGMT  12/25/2018   IR RADIOLOGIST EVAL & MGMT  01/08/2019   IR RADIOLOGIST EVAL & MGMT  01/29/2019   PANNICULECTOMY N/A 10/26/2018   Procedure: PANNICULECTOMY;  Surgeon: Johnathan Hausen, MD;  Location: [facility];  Service: General;  Laterality: N/A;   TENDON REPAIR  09/2015   left posterior tibial and peroneal tendon repairs   TOE AMPUTATION  01/2015   right 2nd toe   TONSILLECTOMY  age 70   TRANSOBTURATOR SLING  01-08-2004   dr Jeffie Pollock '@[facility]'$     Current Medications: Current Meds  Medication Sig   albuterol (VENTOLIN HFA) 108 (90 Base) MCG/ACT inhaler Inhale 2 puffs into the lungs every 6 (six) hours as needed.   ALPRAZolam (XANAX) 1 MG tablet Take 1 tablet (1 mg total) by mouth 3 (three) times daily as needed.   atorvastatin (LIPITOR) 20 MG tablet 1 tablet   cephALEXin (KEFLEX) 500 MG capsule Take 1 capsule (500 mg total) by mouth 3 (three) times  daily.   cyclobenzaprine (FLEXERIL) 10 MG tablet Take 1 tablet (10 mg total) by mouth 3 (three) times daily.   DULoxetine (CYMBALTA) 60 MG capsule 1 capsule   fenofibrate micronized (LOFIBRA) 134 MG capsule Take 1 capsule (134 mg total) by mouth daily.   gabapentin (NEURONTIN) 400 MG capsule Take 1 capsule (400 mg total) by mouth 3 (three) times daily.   hydrochlorothiazide (HYDRODIURIL) 25 MG tablet Take 1 tablet (25 mg total) by mouth daily in the morning   hydroxychloroquine (PLAQUENIL) 200 MG tablet Take 2 tablets (400 mg total) by mouth daily with food or milk   insulin lispro (HUMALOG) 100 UNIT/ML injection Inject 6-15 Units into the skin 3 (three) times daily with meals. 6-15 units sliding scale AS NEEDED   levocetirizine (XYZAL) 5 MG tablet Take 1 tablet (5 mg total) by mouth every evening.    levothyroxine (SYNTHROID) 50 MCG tablet Take 1 tablet by mouth daily.   lidocaine (LIDODERM) 5 % Place 1 patch onto the skin once daily, remove after 12 (twelve) hours.   metFORMIN (GLUCOPHAGE-XR) 500 MG 24 hr tablet Take 2 tablets by mouth twice a day.   methotrexate 25 MG/ML SOLN Inject 20 mg into the muscle every Thursday. Inject 0.8 mL once weekly at bedtime. Every Thursday   metoprolol tartrate (LOPRESSOR) 100 MG tablet Take 100 mg 2 hours before Coronary Ct   montelukast (SINGULAIR) 10 MG tablet Take 1 tablet (10 mg total) by mouth every evening.   NEOMYCIN-POLYMYXIN-HYDROCORTISONE (CORTISPORIN) 1 % SOLN OTIC solution Apply 1-2 drops to toe 2 times  a day after soaking   omeprazole (PRILOSEC) 40 MG capsule Take 1 capsule (40 mg total) by mouth 2 (two) times daily.   Semaglutide, 2 MG/DOSE, (OZEMPIC, 2 MG/DOSE,) 8 MG/3ML SOPN Inject 2 mg into the skin once a week.   tirzepatide (MOUNJARO) 12.5 MG/0.5ML Pen Inject 12.'5mg'$  Subcutaneous Once a week   traMADol (ULTRAM) 50 MG tablet 2 tablet   triamcinolone cream (KENALOG) 0.1 % Apply 1 Application topically 2 (two) times daily.   UNIFINE PENTIPS 31G X 5 MM MISC USE AS DIRECTED 5 TIMES PER DAY   valACYclovir (VALTREX) 1000 MG tablet Take 2 tablets by mouth twice a day for 1 day when needed   valsartan (DIOVAN) 320 MG tablet Take 1 tablet (320 mg total) by mouth daily.   zolpidem (AMBIEN CR) 12.5 MG CR tablet Take 1 tablet (12.5 mg total) by mouth at bedtime as needed.     Allergies:   Dilaudid [hydromorphone hcl], Bupropion, Doxycycline, Hydromorphone, Topiramate, and Trazodone and nefazodone   Social History   Socioeconomic History   Marital status: Widowed    Spouse name: Not on file   Number of children: 2   Years of education: Not on file   Highest education level: Not on file  Occupational History   Not on file  Tobacco Use   Smoking status: Never   Smokeless tobacco: Never  Vaping Use   Vaping Use: Never used  Substance and  Sexual Activity   Alcohol use: No   Drug use: Never   Sexual activity: Not on file  Other Topics Concern   Not on file  Social History Narrative   [employer]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   Social Determinants of Health   Financial Resource Strain: Not on file  Food Insecurity: Not on file  Transportation Needs: Not on file  Physical Activity: Not on file  Stress: Not on file  Social Connections: Not on file  Family History: The patient's family history includes Arrhythmia in her father; COPD in her father; Cancer in her father; Diabetes in her mother and sister; Migraines in her mother.  ROS:   Please see the history of present illness.     All other systems reviewed and are negative.  EKGs/Labs/Other Studies Reviewed:    The following studies were reviewed today: Myoview 07/08/16: Study Highlights    Nuclear stress EF: 50%. There was no ST segment deviation noted during stress. The study is normal. This is a low risk study. The left ventricular ejection fraction is mildly decreased (45-54%).   Low risk stress nuclear study with normal perfusion and borderline low left ventricular global systolic function.     Echo 07/07/16: Study Conclusions   - Left ventricle: The cavity size was normal. Wall thickness was    increased in a pattern of mild LVH. Systolic function was normal.    The estimated ejection fraction was in the range of 55% to 60%.    Wall motion was normal; there were no regional wall motion    abnormalities. Doppler parameters are consistent with abnormal    left ventricular relaxation (grade 1 diastolic dysfunction).  - Aortic valve: There was no stenosis.  - Mitral valve: Mildly calcified annulus. There was trivial    regurgitation.  - Right ventricle: The cavity size was normal. Systolic function    was normal.  - Tricuspid valve: Peak RV-RA gradient (S): 16 mm Hg.  - Pulmonary arteries: PA peak pressure: 19 mm Hg (S).  - Inferior vena cava: The vessel  was normal in size. The    respirophasic diameter changes were in the normal range (>= 50%),    consistent with normal central venous pressure.   Impressions:   - Normal LV size with mild LV hypertrophy. EF 55-60%. Normal RV    size and systolic function. No significant valvular    abnormalities.   EKG:  EKG is  ordered today.  The ekg ordered today demonstrates NSR with PVCs. Otherwise normal. I have personally reviewed and interpreted this study.   Recent Labs: No results found for requested labs within last 365 days.  Recent Lipid Panel No results found for: "CHOL", "TRIG", "HDL", "CHOLHDL", "VLDL", "LDLCALC", "LDLDIRECT" Dated 05/26/21: cholesterol 159, triglycerides 99, HDL 64, LDL 88.  Dated 03/07/22: creatinine 1.13, otherwise CMET, CBC, TSH normal.  Dated 07/05/22: A1c 7%  Risk Assessment/Calculations:      HYPERTENSION CONTROL Vitals:   07/07/22 1039 07/07/22 1057  BP: (!) 144/80 (!) 155/80    The patient's blood pressure is elevated above target today.  In order to address the patient's elevated BP: Blood pressure will be monitored at home to determine if medication changes need to be made.            Physical Exam:    VS:  BP (!) 155/80   Pulse 77   Ht '5\' 9"'$  (1.753 m)   Wt 210 lb (95.3 kg)   SpO2 95%   BMI 31.01 kg/m     Wt Readings from Last 3 Encounters:  07/07/22 210 lb (95.3 kg)  03/11/22 203 lb 6.4 oz (92.3 kg)  09/02/21 214 lb (97.1 kg)     GEN:  Well nourished, well developed in no acute distress HEENT: Normal NECK: No JVD; No carotid bruits LYMPHATICS: No lymphadenopathy CARDIAC: RRR, no murmurs, rubs, gallops RESPIRATORY:  Clear to auscultation without rales, wheezing or rhonchi  ABDOMEN: Soft, non-tender, non-distended MUSCULOSKELETAL:  No edema;  No deformity  SKIN: Warm and dry NEUROLOGIC:  Alert and oriented x 3 PSYCHIATRIC:  Normal affect   ASSESSMENT:    1. Chest pain, precordial   2. PVC's (premature ventricular contractions)    3. Primary hypertension    PLAN:    In order of problems listed above:  Chest pain with atypical features. Patient with multiple risk factors including IDDM, HTN, HLD and RA. Recommend ischemic evaluation. Discussed options including coronary CTA vs stress testing. I would prefer CTA providing PVCs allow it. Will dose with beta blocker. Has lab work scheduled next week with Dr Kenton Kingfisher.  PVCs. Prior LV function normal. Will await results of CT. May consider a trial of beta blocker if symptoms persist HLD on statin. Follow up lipid panel next week IDDM followed by Dr Chalmers Cater HTN. BP elevated today. Encourage her to keep a diary so we can monitor trend.            Medication Adjustments/Labs and Tests Ordered: Current medicines are reviewed at length with the patient today.  Concerns regarding medicines are outlined above.  Orders Placed This Encounter  Procedures   CT CORONARY MORPH W/CTA COR W/SCORE W/CA W/CM &/OR WO/CM   Meds ordered this encounter  Medications   metoprolol tartrate (LOPRESSOR) 100 MG tablet    Sig: Take 100 mg 2 hours before Coronary Ct    Dispense:  1 tablet    Refill:  0    Patient Instructions  Medication Instructions:  Continue same medications   Lab Work: Have PCP fax your lab work to [facility] fax #  [phone] )   Testing/Procedures: Coronary CT will be scheduled after approved by insurance   Follow instructions below    Follow-Up: At [facility], you and your health needs are our priority.  As part of our continuing mission to provide you with exceptional heart care, we have created designated Provider Care Teams.  These Care Teams include your primary Cardiologist (physician) and Advanced Practice Providers (APPs -  Physician Assistants and Nurse Practitioners) who all work together to provide you with the care you need, when you need it.  We recommend signing up for the patient portal called "MyChart".  Sign up information is provided  on this After Visit Summary.  MyChart is used to connect with patients for Virtual Visits (Telemedicine).  Patients are able to view lab/test results, encounter notes, upcoming appointments, etc.  Non-urgent messages can be sent to your provider as well.   To learn more about what you can do with MyChart, go to [email or web address].    Your next appointment:  After test  The format for your next appointment: Office   Provider:  Dr.Lilyauna Miedema     Your cardiac CT will be scheduled at one of the below locations:   [facility] [address] [place], [place] [address] [phone]  [place] [address] [place], [place] [address] [phone]  If scheduled at [facility], please arrive at the [facility] and Children's Entrance (Entrance C2) of [facility] 30 minutes prior to test start time. You can use the FREE valet parking offered at entrance C (encouraged to control the heart rate for the test)  Proceed to the [facility] Radiology Department (first floor) to check-in and test prep.  All radiology patients and guests should use entrance C2 at [facility], accessed from [facility], even though the hospital's physical address listed  is [address]    If scheduled at [facility], please arrive 15 mins early for check-in and test prep.  Please follow these instructions carefully (unless otherwise directed):  Hold all erectile dysfunction medications at least 3 days (72 hrs) prior to test.  On the Night Before the Test: Be sure to Drink plenty of water. Do not consume any caffeinated/decaffeinated beverages or chocolate 12 hours prior to your test. Do not take any antihistamines 12 hours prior to your test. If the patient has contrast allergy: Patient will need a prescription for Prednisone and very clear instructions (as  follows): Prednisone 50 mg - take 13 hours prior to test Take another Prednisone 50 mg 7 hours prior to test Take another Prednisone 50 mg 1 hour prior to test Take Benadryl 50 mg 1 hour prior to test Patient must complete all four doses of above prophylactic medications. Patient will need a ride after test due to Benadryl.  On the Day of the Test: Drink plenty of water until 1 hour prior to the test. Do not eat any food 4 hours prior to the test. You may take your regular medications prior to the test.  Take metoprolol (Lopressor) two hours prior to test. HOLD Furosemide/Hydrochlorothiazide morning of the test. FEMALES- please wear underwire-free bra if available, avoid dresses & tight clothing         After the Test: Drink plenty of water. After receiving IV contrast, you may experience a mild flushed feeling. This is normal. On occasion, you may experience a mild rash up to 24 hours after the test. This is not dangerous. If this occurs, you can take Benadryl 25 mg and increase your fluid intake. If you experience trouble breathing, this can be serious. If it is severe call 911 IMMEDIATELY. If it is mild, please call our office. If you take any of these medications: Glipizide/Metformin, Avandament, Glucavance, please do not take 48 hours after completing test unless otherwise instructed.  We will call to schedule your test 2-4 weeks out understanding that some insurance companies will need an authorization prior to the service being performed.   For non-scheduling related questions, please contact the cardiac imaging nurse navigator should you have any questions/concerns: Marchia Bond, Cardiac Imaging Nurse Navigator Gordy Clement, Cardiac Imaging Nurse Navigator [facility] Direct Office Dial: [phone]   For scheduling needs, including cancellations and rescheduling, please call [place], [phone].   Important Information About  Sugar         Signed, Kavi Almquist [place], MD  07/07/2022 11:06 AM    [place]

## 2022-07-05 NOTE — Addendum Note (Signed)
Addended by: Clovis Riley E on: 07/05/2022 10:00 AM   Modules accepted: Orders

## 2022-07-06 ENCOUNTER — Other Ambulatory Visit (HOSPITAL_COMMUNITY): Payer: Self-pay

## 2022-07-06 MED ORDER — ALPRAZOLAM 1 MG PO TABS: 1.0000 mg | ORAL_TABLET | Freq: Three times a day (TID) | ORAL | 1 refills | Status: DC | PRN

## 2022-07-07 ENCOUNTER — Encounter: Payer: Self-pay | Admitting: Cardiology

## 2022-07-07 ENCOUNTER — Other Ambulatory Visit (HOSPITAL_COMMUNITY): Payer: Self-pay

## 2022-07-07 ENCOUNTER — Ambulatory Visit: Payer: 59 | Admitting: Cardiology

## 2022-07-07 VITALS — BP 155/80 | HR 77 | Ht 69.0 in | Wt 210.0 lb

## 2022-07-07 DIAGNOSIS — R072 Precordial pain: Secondary | ICD-10-CM

## 2022-07-07 DIAGNOSIS — I10 Essential (primary) hypertension: Secondary | ICD-10-CM | POA: Diagnosis not present

## 2022-07-07 DIAGNOSIS — I493 Ventricular premature depolarization: Secondary | ICD-10-CM

## 2022-07-07 MED ORDER — METOPROLOL TARTRATE 100 MG PO TABS
ORAL_TABLET | ORAL | 0 refills | Status: DC
  Filled 2022-07-07: qty 1, 1d supply, fill #0

## 2022-07-07 NOTE — Addendum Note (Signed)
Addended by: Kathyrn Lass on: 07/07/2022 11:50 AM   Modules accepted: Orders

## 2022-07-07 NOTE — Patient Instructions (Addendum)
Medication Instructions:  Continue same medications   Lab Work: Have PCP fax your lab work to [place] fax #  [phone] )   Testing/Procedures: Coronary CT will be scheduled after approved by insurance   Follow instructions below    Follow-Up: At [facility], you and your health needs are our priority.  As part of our continuing mission to provide you with exceptional heart care, we have created designated Provider Care Teams.  These Care Teams include your primary Cardiologist (physician) and Advanced Practice Providers (APPs -  Physician Assistants and Nurse Practitioners) who all work together to provide you with the care you need, when you need it.  We recommend signing up for the patient portal called "MyChart".  Sign up information is provided on this After Visit Summary.  MyChart is used to connect with patients for Virtual Visits (Telemedicine).  Patients are able to view lab/test results, encounter notes, upcoming appointments, etc.  Non-urgent messages can be sent to your provider as well.   To learn more about what you can do with MyChart, go to [email or web address].    Your next appointment:  After test  The format for your next appointment: Office   Provider:  Dr.Jordan     Your cardiac CT will be scheduled at one of the below locations:   [facility] [address] [place], [place] [address] [phone]  [address] [place], [place] [address] [phone]  If scheduled at [facility], please arrive at the [facility] and Children's Entrance (Entrance C2) of [facility] 30 minutes prior to test start time. You can use the FREE valet parking offered at entrance C (encouraged to control the heart rate for the test)  Proceed to the [facility] Radiology Department (first floor) to check-in and test prep.  All radiology patients and guests should use entrance C2  at [facility], accessed from [facility], even though the hospital's physical address listed is [address]    If scheduled at [facility], please arrive 15 mins early for check-in and test prep.  Please follow these instructions carefully (unless otherwise directed):   On the Night Before the Test: Be sure to Drink plenty of water. Do not consume any caffeinated/decaffeinated beverages or chocolate 12 hours prior to your test. Do not take any antihistamines 12 hours prior to your test.   On the Day of the Test: Drink plenty of water until 1 hour prior to the test. Do not eat any food 4 hours prior to the test. You may take your regular medications prior to the test.  Take metoprolol 100 mg two hours prior to test. HOLD Hydrochlorothiazide morning of the test. FEMALES- please wear underwire-free bra if available, avoid dresses & tight clothing         After the Test: Drink plenty of water. After receiving IV contrast, you may experience a mild flushed feeling. This is normal. On occasion, you may experience a mild rash up to 24 hours after the test. This is not dangerous. If this occurs, you can take Benadryl 25 mg and increase your fluid intake. If you experience trouble breathing, this can be serious. If it is severe call 911 IMMEDIATELY. If it is mild, please call our office. If you take any of these medications: Glipizide/Metformin, Avandament, Glucavance, please do not take 48 hours after completing test unless otherwise instructed.  We will  call to schedule your test 2-4 weeks out understanding that some insurance companies will need an authorization prior to the service being performed.   For non-scheduling related questions, please contact the cardiac imaging nurse navigator should you have any questions/concerns: Marchia Bond, Cardiac Imaging Nurse Navigator Gordy Clement, Cardiac Imaging Nurse Navigator [facility] Direct Office Dial: [phone]   For scheduling needs, including cancellations and rescheduling, please call [place], [phone].   Important Information About Sugar

## 2022-07-12 ENCOUNTER — Other Ambulatory Visit (HOSPITAL_COMMUNITY): Payer: Self-pay

## 2022-07-12 DIAGNOSIS — E669 Obesity, unspecified: Secondary | ICD-10-CM | POA: Diagnosis not present

## 2022-07-12 DIAGNOSIS — M5136 Other intervertebral disc degeneration, lumbar region: Secondary | ICD-10-CM | POA: Diagnosis not present

## 2022-07-12 DIAGNOSIS — M1991 Primary osteoarthritis, unspecified site: Secondary | ICD-10-CM | POA: Diagnosis not present

## 2022-07-12 DIAGNOSIS — Z6830 Body mass index (BMI) 30.0-30.9, adult: Secondary | ICD-10-CM | POA: Diagnosis not present

## 2022-07-12 DIAGNOSIS — M79644 Pain in right finger(s): Secondary | ICD-10-CM | POA: Diagnosis not present

## 2022-07-12 DIAGNOSIS — Z79899 Other long term (current) drug therapy: Secondary | ICD-10-CM | POA: Diagnosis not present

## 2022-07-12 DIAGNOSIS — M0589 Other rheumatoid arthritis with rheumatoid factor of multiple sites: Secondary | ICD-10-CM | POA: Diagnosis not present

## 2022-07-12 DIAGNOSIS — L401 Generalized pustular psoriasis: Secondary | ICD-10-CM | POA: Diagnosis not present

## 2022-07-12 MED ORDER — "TUBERCULIN SYRINGE 27G X 1/2"" 1 ML MISC"
3 refills | Status: DC
  Filled 2022-07-12: qty 12, 84d supply, fill #0
  Filled 2023-03-20: qty 12, 84d supply, fill #1

## 2022-07-12 MED ORDER — FOLIC ACID 1 MG PO TABS
2.0000 mg | ORAL_TABLET | Freq: Every day | ORAL | 3 refills | Status: AC
  Filled 2022-07-12: qty 180, 90d supply, fill #0
  Filled 2023-03-20: qty 180, 90d supply, fill #1
  Filled 2023-06-12: qty 180, 90d supply, fill #2

## 2022-07-12 MED ORDER — METHOTREXATE SODIUM CHEMO INJECTION 50 MG/2ML
20.0000 mg | INTRAMUSCULAR | 1 refills | Status: DC
  Filled 2022-07-12: qty 4, 35d supply, fill #0
  Filled 2023-04-13: qty 4, 35d supply, fill #1
  Filled 2023-06-09: qty 4, 35d supply, fill #2

## 2022-07-14 ENCOUNTER — Other Ambulatory Visit: Payer: Self-pay

## 2022-07-14 ENCOUNTER — Other Ambulatory Visit (HOSPITAL_COMMUNITY): Payer: Self-pay

## 2022-07-14 ENCOUNTER — Telehealth: Payer: Self-pay

## 2022-07-14 DIAGNOSIS — G8929 Other chronic pain: Secondary | ICD-10-CM | POA: Diagnosis not present

## 2022-07-14 DIAGNOSIS — I10 Essential (primary) hypertension: Secondary | ICD-10-CM | POA: Diagnosis not present

## 2022-07-14 DIAGNOSIS — E782 Mixed hyperlipidemia: Secondary | ICD-10-CM | POA: Diagnosis not present

## 2022-07-14 DIAGNOSIS — Z79899 Other long term (current) drug therapy: Secondary | ICD-10-CM

## 2022-07-14 DIAGNOSIS — F419 Anxiety disorder, unspecified: Secondary | ICD-10-CM | POA: Diagnosis not present

## 2022-07-14 DIAGNOSIS — E1142 Type 2 diabetes mellitus with diabetic polyneuropathy: Secondary | ICD-10-CM | POA: Diagnosis not present

## 2022-07-14 DIAGNOSIS — K219 Gastro-esophageal reflux disease without esophagitis: Secondary | ICD-10-CM | POA: Diagnosis not present

## 2022-07-14 DIAGNOSIS — E039 Hypothyroidism, unspecified: Secondary | ICD-10-CM | POA: Diagnosis not present

## 2022-07-14 DIAGNOSIS — F324 Major depressive disorder, single episode, in partial remission: Secondary | ICD-10-CM | POA: Diagnosis not present

## 2022-07-14 DIAGNOSIS — M069 Rheumatoid arthritis, unspecified: Secondary | ICD-10-CM | POA: Diagnosis not present

## 2022-07-14 MED ORDER — AZITHROMYCIN 250 MG PO TABS
ORAL_TABLET | ORAL | 0 refills | Status: AC
  Filled 2022-07-14: qty 6, 5d supply, fill #0

## 2022-07-14 MED ORDER — ZOLPIDEM TARTRATE ER 12.5 MG PO TBCR
12.5000 mg | EXTENDED_RELEASE_TABLET | Freq: Every evening | ORAL | 1 refills | Status: DC | PRN
  Filled 2022-07-14: qty 64, 64d supply, fill #0
  Filled 2022-07-14: qty 26, 26d supply, fill #0
  Filled 2022-12-22: qty 90, 90d supply, fill #1

## 2022-07-14 NOTE — Telephone Encounter (Signed)
Spoke with daughter Theadora Rama for patient to get blood work Architectural technologist. She stated her mother gets off work at [employer] and can come. She will inform patient of blood work. Order placed for BMET.

## 2022-07-14 NOTE — Telephone Encounter (Signed)
Patient states that she already have the labs done today and they will be sending to our office. Please advise

## 2022-07-14 NOTE — Telephone Encounter (Signed)
Left message with our fax number [phone].

## 2022-07-15 ENCOUNTER — Telehealth: Payer: Self-pay | Admitting: Cardiology

## 2022-07-15 NOTE — Telephone Encounter (Signed)
Routed to primary nurse as Juluis Rainier re: labs being faxed

## 2022-07-15 NOTE — Telephone Encounter (Signed)
Patient called in to say that she got a her labs done and her PCP and they will fax our office the results. Please advise

## 2022-07-19 ENCOUNTER — Other Ambulatory Visit (HOSPITAL_COMMUNITY): Payer: Self-pay

## 2022-07-19 ENCOUNTER — Telehealth (HOSPITAL_COMMUNITY): Payer: Self-pay | Admitting: Emergency Medicine

## 2022-07-19 DIAGNOSIS — Z79899 Other long term (current) drug therapy: Secondary | ICD-10-CM | POA: Diagnosis not present

## 2022-07-19 DIAGNOSIS — R072 Precordial pain: Secondary | ICD-10-CM

## 2022-07-19 LAB — BASIC METABOLIC PANEL
BUN/Creatinine Ratio: 20 (ref 9–23)
BUN: 26 mg/dL — ABNORMAL HIGH (ref 6–24)
CO2: 30 mmol/L — ABNORMAL HIGH (ref 20–29)
Calcium: 10.4 mg/dL — ABNORMAL HIGH (ref 8.7–10.2)
Chloride: 98 mmol/L (ref 96–106)
Creatinine, Ser: 1.3 mg/dL — ABNORMAL HIGH (ref 0.57–1.00)
Glucose: 88 mg/dL (ref 70–99)
Potassium: 4.5 mmol/L (ref 3.5–5.2)
Sodium: 137 mmol/L (ref 134–144)
eGFR: 48 mL/min/{1.73_m2} — ABNORMAL LOW (ref >59)

## 2022-07-19 MED ORDER — METOPROLOL TARTRATE 100 MG PO TABS
ORAL_TABLET | ORAL | 0 refills | Status: DC
  Filled 2022-07-19: qty 1, 1d supply, fill #0

## 2022-07-19 NOTE — Telephone Encounter (Signed)
Already spoke to patient see previous 9/5 telephone note.

## 2022-07-19 NOTE — Telephone Encounter (Signed)
Spoke to patient advised we never received bmet from PCP.She will come to office this morning to have done.Coronary CT scheduled tomorrow 9/6.

## 2022-07-19 NOTE — Telephone Encounter (Signed)
Pt is calling back to speak to Elly Modena, LPN. Requesting call back.

## 2022-07-19 NOTE — Telephone Encounter (Signed)
Reaching out to patient to offer assistance regarding upcoming cardiac imaging study; pt verbalizes understanding of appt date/time, parking situation and where to check in, pre-test NPO status and medications ordered, and verified current allergies; name and call back number provided for further questions should they arise Amy Bond RN Navigator Cardiac Imaging [facility] [phone] office [phone] cell  Denies iv issues Arrival 730  '100mg'$  metoprolol tartrate  Hold allergy meds

## 2022-07-20 ENCOUNTER — Ambulatory Visit (HOSPITAL_COMMUNITY)
Admission: RE | Admit: 2022-07-20 | Discharge: 2022-07-20 | Disposition: A | Discharge status: Home / Self Care | Payer: 59 | Source: Ambulatory Visit | Attending: Cardiology | Admitting: Cardiology

## 2022-07-20 DIAGNOSIS — I493 Ventricular premature depolarization: Secondary | ICD-10-CM | POA: Diagnosis not present

## 2022-07-20 DIAGNOSIS — I10 Essential (primary) hypertension: Secondary | ICD-10-CM | POA: Diagnosis not present

## 2022-07-20 DIAGNOSIS — R072 Precordial pain: Secondary | ICD-10-CM | POA: Diagnosis not present

## 2022-07-20 MED ORDER — DILTIAZEM HCL 25 MG/5ML IV SOLN
10.0000 mg | INTRAVENOUS | Status: DC | PRN
  Administered 2022-07-20: 10 mg via INTRAVENOUS

## 2022-07-20 MED ORDER — DILTIAZEM HCL 25 MG/5ML IV SOLN
INTRAVENOUS | Status: AC
  Administered 2022-07-20: 10 mg via INTRAVENOUS
  Filled 2022-07-20: qty 5

## 2022-07-20 MED ORDER — NITROGLYCERIN 0.4 MG SL SUBL: 0.8000 mg | SUBLINGUAL_TABLET | Freq: Once | SUBLINGUAL | Status: AC

## 2022-07-20 MED ORDER — METOPROLOL TARTRATE 5 MG/5ML IV SOLN
10.0000 mg | INTRAVENOUS | Status: DC | PRN
  Administered 2022-07-20 (×2): 10 mg via INTRAVENOUS

## 2022-07-20 MED ORDER — NITROGLYCERIN 0.4 MG SL SUBL
SUBLINGUAL_TABLET | SUBLINGUAL | Status: AC
  Administered 2022-07-20: 0.8 mg
  Filled 2022-07-20: qty 2

## 2022-07-20 MED ORDER — IOHEXOL 350 MG/ML SOLN
100.0000 mL | Freq: Once | INTRAVENOUS | Status: AC | PRN
  Administered 2022-07-20: 100 mL via INTRAVENOUS

## 2022-07-20 MED ORDER — METOPROLOL TARTRATE 5 MG/5ML IV SOLN
INTRAVENOUS | Status: AC
  Administered 2022-07-20: 10 mg via INTRAVENOUS
  Filled 2022-07-20: qty 30

## 2022-07-28 ENCOUNTER — Other Ambulatory Visit (HOSPITAL_COMMUNITY): Payer: Self-pay

## 2022-07-29 ENCOUNTER — Other Ambulatory Visit (HOSPITAL_COMMUNITY): Payer: Self-pay

## 2022-07-29 MED ORDER — MOUNJARO 12.5 MG/0.5ML ~~LOC~~ SOAJ
12.5000 mg | SUBCUTANEOUS | 0 refills | Status: DC
  Filled 2022-07-29: qty 2, 28d supply, fill #0

## 2022-08-01 ENCOUNTER — Other Ambulatory Visit (HOSPITAL_COMMUNITY): Payer: Self-pay

## 2022-08-02 ENCOUNTER — Other Ambulatory Visit: Payer: Self-pay

## 2022-08-02 ENCOUNTER — Other Ambulatory Visit (HOSPITAL_COMMUNITY): Payer: Self-pay

## 2022-08-02 MED ORDER — MOUNJARO 12.5 MG/0.5ML ~~LOC~~ SOAJ: 12.5000 mg | SUBCUTANEOUS | 0 refills | Status: DC

## 2022-08-02 MED ORDER — MOUNJARO 12.5 MG/0.5ML ~~LOC~~ SOAJ
12.5000 mg | SUBCUTANEOUS | 0 refills | Status: DC
Start: 2022-08-01 — End: 2022-08-26
  Filled 2022-08-02: qty 6, 84d supply, fill #0

## 2022-08-03 ENCOUNTER — Other Ambulatory Visit: Payer: Self-pay

## 2022-08-03 ENCOUNTER — Other Ambulatory Visit: Payer: Self-pay | Admitting: Endocrinology

## 2022-08-03 DIAGNOSIS — E049 Nontoxic goiter, unspecified: Secondary | ICD-10-CM

## 2022-08-03 NOTE — Progress Notes (Unsigned)
Cardiology Office Note:    Date:  08/08/2022   ID:  Amy Adams, DOB [birth year], MRN [id number]  PCP:  Shirline Frees, [place] Providers Cardiologist:  None     Referring MD: Shirline Frees, MD   Chief Complaint  Patient presents with   Palpitations    History of Present Illness:    Amy Adams is a 56 y.o. female seen at the request of Dr Kenton Kingfisher for evaluation of chest pain and palpitations. She has a history of DM, HTN and HLD. Known RA. He was evaluated in the past with Dr Johnsie Cancel and had unremarkable Stress Myoview and Echo studies in 2017.   She reports that she has been experiencing more palpitations. Describes this as a fluttering sensation. Worse since her husband passed away a year ago. She has also had some left anterior chest pain at times. Nonexertional. Has a sharp shooting pain in her left neck and numbness in her left arm. States she had PVCs in 2017 but this improved with cutting out caffeine. She is active and walks regularly. Is going to need another operation on her toe.  We performed coronary CTA which demonstrated calcium score of 0 with minimal CAD.   On follow up today she is still having the PVCs. No dizziness or syncope. Feels her PVCs are related to multiple stressors now. Planning to proceed with toe surgery in Dec. May need a back procedure as well.   Past Medical History:  Diagnosis Date   Anxiety    Arthritis    Chronic foot pain, left    GERD (gastroesophageal reflux disease)    History of anal fissures    Hyperlipidemia    Hypertension    Hypothyroidism    Insulin dependent type 2 diabetes mellitus ([place])    endrocrinologist-- dr Chalmers Cater   Migraines    Peripheral neuropathy    RA (rheumatoid arthritis) [facility])    rheumatologist-  dr Amil Amen   SUI (stress urinary incontinence, female)    Wears glasses     Past Surgical History:  Procedure Laterality Date   ABDOMINAL HYSTERECTOMY  1990   with Right  Salpingo--ophorectomy   ANAL SPHINCTEROTOMY  01-05-2006    dr Rise Patience '@[facility]    BUNIONECTOMY  09-25-2009   dr Milinda Pointer '@[facility]    right great toe   CARPAL TUNNEL RELEASE Bilateral 1996;  [place]  12/ 2014   dr Milinda Pointer   left 2nd hammertoe repair,  left 2nd metatarsal matrixectomy, left heel endoscopic plantar fasiotomy   IR CATHETER TUBE CHANGE  01/01/2019   IR RADIOLOGIST EVAL & MGMT  11/28/2018   IR RADIOLOGIST EVAL & MGMT  11/29/2018   IR RADIOLOGIST EVAL & MGMT  12/06/2018   IR RADIOLOGIST EVAL & MGMT  12/25/2018   IR RADIOLOGIST EVAL & MGMT  01/08/2019   IR RADIOLOGIST EVAL & MGMT  01/29/2019   PANNICULECTOMY N/A 10/26/2018   Procedure: PANNICULECTOMY;  Surgeon: Johnathan Hausen, MD;  Location: [facility];  Service: General;  Laterality: N/A;   TENDON REPAIR  09/2015   left posterior tibial and peroneal tendon repairs   TOE AMPUTATION  01/2015   right 2nd toe   TONSILLECTOMY  age 3   TRANSOBTURATOR SLING  01-08-2004   dr Jeffie Pollock '@[facility]     Current Medications: Current Meds  Medication Sig   albuterol (VENTOLIN HFA) 108 (90 Base) MCG/ACT inhaler Inhale 2 puffs into the lungs every 6 (six)  hours as needed.   ALPRAZolam (XANAX) 1 MG tablet Take 1 tablet (1 mg total) by mouth 3 (three) times daily as needed.   cyclobenzaprine (FLEXERIL) 10 MG tablet Take 1 tablet (10 mg total) by mouth 3 (three) times daily.   DULoxetine (CYMBALTA) 60 MG capsule 1 capsule   fenofibrate micronized (LOFIBRA) 134 MG capsule Take 1 capsule (134 mg total) by mouth daily.   folic acid (FOLVITE) 1 MG tablet Take 2 tablets (2 mg total) by mouth daily.   gabapentin (NEURONTIN) 400 MG capsule Take 1 capsule (400 mg total) by mouth 3 (three) times daily.   hydrochlorothiazide (HYDRODIURIL) 25 MG tablet Take 1 tablet (25 mg total) by mouth daily in the morning   hydroxychloroquine (PLAQUENIL) 200 MG tablet Take 2 tablets (400 mg total) by mouth daily with food or milk   levocetirizine (XYZAL)  5 MG tablet Take 1 tablet (5 mg total) by mouth every evening.   levothyroxine (SYNTHROID) 50 MCG tablet Take 1 tablet by mouth daily.   lidocaine (LIDODERM) 5 % Place 1 patch onto the skin once daily, remove after 12 (twelve) hours.   metFORMIN (GLUCOPHAGE-XR) 500 MG 24 hr tablet Take 2 tablets by mouth twice a day.   methotrexate 25 MG/ML SOLN Inject 20 mg into the muscle every Thursday. Inject 0.8 mL once weekly at bedtime. Every Thursday   methotrexate 50 MG/2ML injection Inject 0.8 mLs (20 mg total) into the skin once a week as directed   montelukast (SINGULAIR) 10 MG tablet Take 1 tablet (10 mg total) by mouth every evening.   NEOMYCIN-POLYMYXIN-HYDROCORTISONE (CORTISPORIN) 1 % SOLN OTIC solution Apply 1-2 drops to toe 2 times  a day after soaking   Semaglutide, 2 MG/DOSE, (OZEMPIC, 2 MG/DOSE,) 8 MG/3ML SOPN Inject 2 mg into the skin once a week.   tirzepatide (MOUNJARO) 12.5 MG/0.5ML Pen Inject 12.5 mg into the skin once a week.   tirzepatide (MOUNJARO) 12.5 MG/0.5ML Pen Inject 12.5 mg into the skin once a week.   tirzepatide (MOUNJARO) 12.5 MG/0.5ML Pen Inject 12.5 mg into the skin once a week.   tirzepatide (MOUNJARO) 12.5 MG/0.5ML Pen Inject 12.5 mg into the skin once a week.   traMADol (ULTRAM) 50 MG tablet 2 tablet   triamcinolone cream (KENALOG) 0.1 % Apply 1 Application topically 2 (two) times daily.   TUBERCULIN SYR 1CC/27GX1/2" 27G X 1/2" 1 ML MISC Inject into the skin once a week with methotrexate   UNIFINE PENTIPS 31G X 5 MM MISC USE AS DIRECTED 5 TIMES PER DAY   valACYclovir (VALTREX) 1000 MG tablet Take 2 tablets by mouth twice a day for 1 day when needed   valsartan (DIOVAN) 320 MG tablet Take 1 tablet (320 mg total) by mouth daily.   zolpidem (AMBIEN CR) 12.5 MG CR tablet Take 1 tablet (12.5 mg total) by mouth at bedtime as needed.   zolpidem (AMBIEN CR) 12.5 MG CR tablet Take 1 tablet (12.5 mg total) by mouth at bedtime as needed.     Allergies:   Dilaudid [hydromorphone  hcl], Bupropion, Doxycycline, Hydromorphone, Topiramate, and Trazodone and nefazodone   Social History   Socioeconomic History   Marital status: Widowed    Spouse name: Not on file   Number of children: 2   Years of education: Not on file   Highest education level: Not on file  Occupational History   Not on file  Tobacco Use   Smoking status: Never   Smokeless tobacco: Never  Vaping Use  Vaping Use: Never used  Substance and Sexual Activity   Alcohol use: No   Drug use: Never   Sexual activity: Not on file  Other Topics Concern   Not on file  Social History Narrative   [place]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   Social Determinants of Health   Financial Resource Strain: Not on file  Food Insecurity: Not on file  Transportation Needs: Not on file  Physical Activity: Not on file  Stress: Not on file  Social Connections: Not on file     Family History: The patient's family history includes Arrhythmia in her father; COPD in her father; Cancer in her father; Diabetes in her mother and sister; Migraines in her mother.  ROS:   Please see the history of present illness.     All other systems reviewed and are negative.  EKGs/Labs/Other Studies Reviewed:    The following studies were reviewed today: Myoview 07/08/16: Study Highlights    Nuclear stress EF: 50%. There was no ST segment deviation noted during stress. The study is normal. This is a low risk study. The left ventricular ejection fraction is mildly decreased (45-54%).   Low risk stress nuclear study with normal perfusion and borderline low left ventricular global systolic function.     Echo 07/07/16: Study Conclusions   - Left ventricle: The cavity size was normal. Wall thickness was    increased in a pattern of mild LVH. Systolic function was normal.    The estimated ejection fraction was in the range of 55% to 60%.    Wall motion was normal; there were no regional wall motion    abnormalities. Doppler parameters  are consistent with abnormal    left ventricular relaxation (grade 1 diastolic dysfunction).  - Aortic valve: There was no stenosis.  - Mitral valve: Mildly calcified annulus. There was trivial    regurgitation.  - Right ventricle: The cavity size was normal. Systolic function    was normal.  - Tricuspid valve: Peak RV-RA gradient (S): 16 mm Hg.  - Pulmonary arteries: PA peak pressure: 19 mm Hg (S).  - Inferior vena cava: The vessel was normal in size. The    respirophasic diameter changes were in the normal range (>= 50%),    consistent with normal central venous pressure.   Impressions:   - Normal LV size with mild LV hypertrophy. EF 55-60%. Normal RV    size and systolic function. No significant valvular    abnormalities.    ADDENDUM REPORT: 07/20/2022 11:17   EXAM: OVER-READ INTERPRETATION  CT CHEST   The following report is an over-read performed by radiologist Dr. Norlene Duel [facility] on 07/20/2022. This over-read does not include interpretation of cardiac or coronary anatomy or pathology. The coronary CTA interpretation by the cardiologist is attached.   COMPARISON:  CT AP 01/08/2019.   FINDINGS: Vascular: No acute abnormality.   Mediastinum/nodes: No mass or adenopathy.   Lungs/pleura: No pleural effusion, airspace consolidation, atelectasis, or pneumothorax. No suspicious lung nodules identified.   Upper abdomen: No acute abnormality identified.   Mediastinum/nodes: Multi level degenerative disc disease noted within the thoracic spine. No acute or suspicious osseous abnormality.   IMPRESSION: No significant noncardiac supplemental findings identified.     Electronically Signed   By: Kerby Moors M.D.   On: 07/20/2022 11:17    Addended by Kerby Moors, MD on 07/20/2022 11:19 AM   Study Result  Narrative & Impression  CLINICAL DATA:  56 Year-old White Female  EXAM: Cardiac/Coronary  CTA   TECHNIQUE: The patient was scanned  on a [employer].   FINDINGS: Scan was triggered in the descending thoracic aorta. Axial non-contrast 3 mm slices were carried out through the heart. The data set was analyzed on a dedicated work station and scored using the [employer]. Gantry rotation speed was 250 msecs and collimation was .6 mm. 0.8 mg of sl NTG was given. The 3D data set was reconstructed in 5% intervals of the 67-82 % of the R-R cycle. Diastolic phases were analyzed on a dedicated work station using MPR, MIP and VRT modes. The patient received 100 cc of contrast.   Coronary Arteries:  Normal coronary origin.  Right dominance.   Coronary Calcium Score:   Left main: 0   Left anterior descending artery: 0   Left circumflex artery: 0   Right coronary artery: 0   Total: 0   Percentile: 1st for age, sex, and race matched control.   Plaque Analysis:   Left main: 0   Left anterior descending artery: 14 cubic mm non calcified plaque.   Left circumflex artery: 0   Right coronary artery: 0   RCA is a large dominant artery that gives rise to PDA and PLA. There is no significant plaque.   Left main is a large artery that gives rise to LAD and LCX arteries. There is no significant plaque.   LAD is a large vessel that gives rise to one large D1 Branch that bifurcates at LAD take off. Two large septal perforators. Minimal non obstructive plaque seen in plaque analysis. Minimal non obstructive plaque at D1 bifurcation. Small myocardial bridge of second septal perforator.   LCX is a non-dominant artery that gives rise to two OM vessels. There is no significant plaque.   Other findings:   Aorta: Normal size.  Aortic atherosclerosis.  No dissection.   Main Pulmonary Artery: Normal size of the pulmonary artery.   Aortic Valve:  Tri-leaflet.  No calcifications.   Mitral valve: Mild mitral annular calcification.   Normal pulmonary vein drainage into the left atrium.   Normal left atrial  appendage without a thrombus.   Interatrial septum with no communications.   Extra-cardiac findings: See attached radiology report for non-cardiac structures.   Artifact: Cardiac motion slab artifact.   IMPRESSION: 1. Coronary calcium score of 0. This was 1st percentile for age, sex, and race matched control.   2. Normal coronary origin with right dominance.   3. Aortic atherosclerosis   4. CAD-RADS 1. Minimal non-obstructive CAD (1-24%). Consider non-atherosclerotic causes of chest pain. Consider preventive therapy and risk factor modification.    EKG:  EKG is not  ordered today.     Recent Labs: 07/19/2022: BUN 26; Creatinine, Ser 1.30; Potassium 4.5; Sodium 137  Recent Lipid Panel No results found for: "CHOL", "TRIG", "HDL", "CHOLHDL", "VLDL", "LDLCALC", "LDLDIRECT" Dated 05/26/21: cholesterol 159, triglycerides 99, HDL 64, LDL 88.  Dated 03/07/22: creatinine 1.13, otherwise CMET, CBC, TSH normal.  Dated 07/05/22: A1c 7% Dated 07/14/22: A1c 6.7%. cholesterol 180, triglycerides 168, HDL 59, LDL 92. CMET, TSH, CBC normal.   Risk Assessment/Calculations:                Physical Exam:    VS:  BP 137/89   Pulse 85   Ht '5\' 9"'$  (1.753 m)   Wt 207 lb 6.4 oz (94.1 kg)   SpO2 98%   BMI 30.63 kg/m     Wt Readings from Last 3  Encounters:  08/08/22 207 lb 6.4 oz (94.1 kg)  07/07/22 210 lb (95.3 kg)  03/11/22 203 lb 6.4 oz (92.3 kg)     GEN:  Well nourished, well developed in no acute distress HEENT: Normal NECK: No JVD; No carotid bruits LYMPHATICS: No lymphadenopathy CARDIAC: RRR, no murmurs, rubs, gallops RESPIRATORY:  Clear to auscultation without rales, wheezing or rhonchi  ABDOMEN: Soft, non-tender, non-distended MUSCULOSKELETAL:  No edema; No deformity  SKIN: Warm and dry NEUROLOGIC:  Alert and oriented x 3 PSYCHIATRIC:  Normal affect   ASSESSMENT:    1. Chest pain, precordial   2. PVC's (premature ventricular contractions)   3. Primary hypertension      PLAN:    In order of problems listed above:  Chest pain with atypical features. Patient with multiple risk factors including IDDM, HTN, HLD and RA. Coronary CTA with minimal CAD and zero calcium score. Very low risk study. Patient reassured.  PVCs. Prior LV function normal.  No significant CAD. Explained that her PVCs are benign but can be symptomatic. Offered her to prescribe a beta blocker but at this time she wants to just work  on managing her stress.  HLD  IDDM followed by Dr Chalmers Cater HTN. BP controlled today.        Follow up PRN   Medication Adjustments/Labs and Tests Ordered: Current medicines are reviewed at length with the patient today.  Concerns regarding medicines are outlined above.  No orders of the defined types were placed in this encounter.  No orders of the defined types were placed in this encounter.   There are no Patient Instructions on file for this visit.   Signed, Malon Siddall [place], MD  08/08/2022 11:56 AM    [place]

## 2022-08-05 DIAGNOSIS — Z79899 Other long term (current) drug therapy: Secondary | ICD-10-CM | POA: Diagnosis not present

## 2022-08-05 DIAGNOSIS — H25813 Combined forms of age-related cataract, bilateral: Secondary | ICD-10-CM | POA: Diagnosis not present

## 2022-08-05 DIAGNOSIS — E119 Type 2 diabetes mellitus without complications: Secondary | ICD-10-CM | POA: Diagnosis not present

## 2022-08-05 DIAGNOSIS — H40033 Anatomical narrow angle, bilateral: Secondary | ICD-10-CM | POA: Diagnosis not present

## 2022-08-05 DIAGNOSIS — H40013 Open angle with borderline findings, low risk, bilateral: Secondary | ICD-10-CM | POA: Diagnosis not present

## 2022-08-08 ENCOUNTER — Encounter: Payer: Self-pay | Admitting: Cardiology

## 2022-08-08 ENCOUNTER — Other Ambulatory Visit (HOSPITAL_COMMUNITY): Payer: Self-pay

## 2022-08-08 ENCOUNTER — Ambulatory Visit: Payer: 59 | Attending: Cardiology | Admitting: Cardiology

## 2022-08-08 VITALS — BP 137/89 | HR 85 | Ht 69.0 in | Wt 207.4 lb

## 2022-08-08 DIAGNOSIS — R072 Precordial pain: Secondary | ICD-10-CM

## 2022-08-08 DIAGNOSIS — I493 Ventricular premature depolarization: Secondary | ICD-10-CM

## 2022-08-08 DIAGNOSIS — I10 Essential (primary) hypertension: Secondary | ICD-10-CM | POA: Diagnosis not present

## 2022-08-10 ENCOUNTER — Other Ambulatory Visit (HOSPITAL_COMMUNITY): Payer: Self-pay

## 2022-08-10 MED ORDER — LEVOTHYROXINE SODIUM 50 MCG PO TABS
50.0000 ug | ORAL_TABLET | Freq: Every morning | ORAL | 4 refills | Status: DC
  Filled 2022-08-10: qty 90, 90d supply, fill #0

## 2022-08-11 DIAGNOSIS — Z6830 Body mass index (BMI) 30.0-30.9, adult: Secondary | ICD-10-CM | POA: Diagnosis not present

## 2022-08-11 DIAGNOSIS — M5417 Radiculopathy, lumbosacral region: Secondary | ICD-10-CM | POA: Diagnosis not present

## 2022-08-12 ENCOUNTER — Other Ambulatory Visit: Payer: Self-pay | Admitting: Neurological Surgery

## 2022-08-12 DIAGNOSIS — M5417 Radiculopathy, lumbosacral region: Secondary | ICD-10-CM

## 2022-08-17 ENCOUNTER — Other Ambulatory Visit (HOSPITAL_COMMUNITY): Payer: Self-pay

## 2022-08-22 ENCOUNTER — Ambulatory Visit
Admission: RE | Admit: 2022-08-22 | Discharge: 2022-08-22 | Disposition: A | Discharge status: Home / Self Care | Payer: 59 | Source: Ambulatory Visit | Attending: Endocrinology | Admitting: Endocrinology

## 2022-08-22 DIAGNOSIS — E042 Nontoxic multinodular goiter: Secondary | ICD-10-CM | POA: Diagnosis not present

## 2022-08-22 DIAGNOSIS — E049 Nontoxic goiter, unspecified: Secondary | ICD-10-CM

## 2022-08-23 ENCOUNTER — Ambulatory Visit
Admission: RE | Admit: 2022-08-23 | Discharge: 2022-08-23 | Disposition: A | Discharge status: Home / Self Care | Payer: 59 | Source: Ambulatory Visit | Attending: Neurological Surgery | Admitting: Neurological Surgery

## 2022-08-23 DIAGNOSIS — M5417 Radiculopathy, lumbosacral region: Secondary | ICD-10-CM

## 2022-08-23 DIAGNOSIS — M48061 Spinal stenosis, lumbar region without neurogenic claudication: Secondary | ICD-10-CM | POA: Diagnosis not present

## 2022-08-23 MED ORDER — GADOPICLENOL 0.5 MMOL/ML IV SOLN
10.0000 mL | Freq: Once | INTRAVENOUS | Status: AC | PRN
  Administered 2022-08-23: 10 mL via INTRAVENOUS

## 2022-08-24 DIAGNOSIS — E1165 Type 2 diabetes mellitus with hyperglycemia: Secondary | ICD-10-CM | POA: Diagnosis not present

## 2022-08-24 DIAGNOSIS — Z23 Encounter for immunization: Secondary | ICD-10-CM | POA: Diagnosis not present

## 2022-08-24 DIAGNOSIS — E7800 Pure hypercholesterolemia, unspecified: Secondary | ICD-10-CM | POA: Diagnosis not present

## 2022-08-24 DIAGNOSIS — E049 Nontoxic goiter, unspecified: Secondary | ICD-10-CM | POA: Diagnosis not present

## 2022-08-24 DIAGNOSIS — M069 Rheumatoid arthritis, unspecified: Secondary | ICD-10-CM | POA: Diagnosis not present

## 2022-08-24 DIAGNOSIS — G609 Hereditary and idiopathic neuropathy, unspecified: Secondary | ICD-10-CM | POA: Diagnosis not present

## 2022-08-24 DIAGNOSIS — E039 Hypothyroidism, unspecified: Secondary | ICD-10-CM | POA: Diagnosis not present

## 2022-08-24 DIAGNOSIS — I10 Essential (primary) hypertension: Secondary | ICD-10-CM | POA: Diagnosis not present

## 2022-08-25 ENCOUNTER — Other Ambulatory Visit (HOSPITAL_COMMUNITY): Payer: Self-pay

## 2022-08-25 MED ORDER — LEVOTHYROXINE SODIUM 50 MCG PO TABS
50.0000 ug | ORAL_TABLET | Freq: Every morning | ORAL | 4 refills | Status: AC
Start: 2022-08-25 — End: ?
  Filled 2022-08-25 – 2022-11-09 (×3): qty 90, 90d supply, fill #0
  Filled 2023-03-08: qty 90, 90d supply, fill #1
  Filled 2023-06-05: qty 90, 90d supply, fill #2

## 2022-08-25 MED ORDER — GABAPENTIN 400 MG PO CAPS
400.0000 mg | ORAL_CAPSULE | Freq: Three times a day (TID) | ORAL | 4 refills | Status: DC
  Filled 2022-08-25 – 2022-09-15 (×2): qty 270, 90d supply, fill #0
  Filled 2023-03-20 – 2023-06-09 (×2): qty 270, 90d supply, fill #1

## 2022-08-25 MED ORDER — HYDROCHLOROTHIAZIDE 25 MG PO TABS
25.0000 mg | ORAL_TABLET | Freq: Every day | ORAL | 4 refills | Status: DC
  Filled 2022-08-25: qty 90, 90d supply, fill #0

## 2022-08-25 MED ORDER — METFORMIN HCL ER 500 MG PO TB24
1000.0000 mg | ORAL_TABLET | Freq: Two times a day (BID) | ORAL | 4 refills | Status: DC
  Filled 2022-08-25: qty 360, 90d supply, fill #0
  Filled 2023-07-18: qty 120, 30d supply, fill #0

## 2022-08-25 MED ORDER — MOUNJARO 12.5 MG/0.5ML ~~LOC~~ SOAJ
12.5000 mg | SUBCUTANEOUS | 4 refills | Status: DC
  Filled 2022-08-25 – 2022-10-18 (×2): qty 6, 84d supply, fill #0
  Filled 2022-12-22 – 2022-12-28 (×2): qty 6, 84d supply, fill #1
  Filled 2023-01-11 – 2023-01-24 (×3): qty 2, 28d supply, fill #1
  Filled 2023-02-10: qty 2, 28d supply, fill #2
  Filled 2023-02-10: qty 6, 84d supply, fill #2
  Filled 2023-03-20 – 2023-06-14 (×2): qty 2, 28d supply, fill #2
  Filled 2023-07-18: qty 2, 28d supply, fill #3

## 2022-08-25 MED ORDER — EASY TOUCH ALCOHOL PREP MEDIUM 70 % PADS
1.0000 | MEDICATED_PAD | Freq: Four times a day (QID) | 4 refills | Status: DC
  Filled 2022-08-25: qty 400, 100d supply, fill #0

## 2022-08-25 MED ORDER — DEXCOM G7 SENSOR MISC
4 refills | Status: DC
  Filled 2022-08-25 – 2022-08-29 (×2): qty 3, 30d supply, fill #0
  Filled 2022-09-26: qty 3, 30d supply, fill #1
  Filled 2022-11-03: qty 9, 90d supply, fill #2
  Filled 2022-11-09: qty 3, 30d supply, fill #2
  Filled 2022-12-23: qty 3, 30d supply, fill #3
  Filled 2023-01-24: qty 3, 30d supply, fill #4
  Filled 2023-02-14 – 2023-02-21 (×3): qty 3, 30d supply, fill #5
  Filled 2023-03-20: qty 3, 30d supply, fill #6
  Filled 2023-04-27: qty 3, 30d supply, fill #7
  Filled 2023-05-24 – 2023-05-26 (×2): qty 3, 30d supply, fill #8
  Filled 2023-06-23 – 2023-07-18 (×2): qty 3, 30d supply, fill #9

## 2022-08-25 MED ORDER — FENOFIBRATE MICRONIZED 134 MG PO CAPS
134.0000 mg | ORAL_CAPSULE | Freq: Every day | ORAL | 4 refills | Status: DC
  Filled 2022-08-25: qty 90, 90d supply, fill #0

## 2022-08-26 ENCOUNTER — Encounter: Payer: 59 | Attending: Registered Nurse | Admitting: Registered Nurse

## 2022-08-26 ENCOUNTER — Other Ambulatory Visit (HOSPITAL_COMMUNITY): Payer: Self-pay

## 2022-08-26 ENCOUNTER — Encounter: Payer: Self-pay | Admitting: Registered Nurse

## 2022-08-26 VITALS — BP 141/85 | HR 80 | Ht 69.0 in | Wt 208.4 lb

## 2022-08-26 DIAGNOSIS — M2550 Pain in unspecified joint: Secondary | ICD-10-CM

## 2022-08-26 DIAGNOSIS — M5416 Radiculopathy, lumbar region: Secondary | ICD-10-CM

## 2022-08-26 DIAGNOSIS — G5793 Unspecified mononeuropathy of bilateral lower limbs: Secondary | ICD-10-CM

## 2022-08-26 DIAGNOSIS — M79672 Pain in left foot: Secondary | ICD-10-CM | POA: Diagnosis not present

## 2022-08-26 MED ORDER — TRAMADOL HCL 50 MG PO TABS
50.0000 mg | ORAL_TABLET | Freq: Three times a day (TID) | ORAL | 0 refills | Status: DC | PRN
  Filled 2022-08-26: qty 270, 90d supply, fill #0

## 2022-08-26 NOTE — Progress Notes (Signed)
Subjective:    Patient ID: Amy Adams, female    DOB: Nov 19, 1965, 56 y.o.   MRN: [id number]  HPI: Amy Adams is a 56 y.o. female who returns for follow up appointment for chronic pain and medication refill. She states her pain is located in her lower back radiating into her right hip and right lower extremity. She also reports left foot pain, she states podiatry is following and she is scheduled for surgery. She rates her pain 5. Her current exercise regime is walking and performing stretching exercises.  Ms. Kyer Morphine equivalent is 15.00 MME.       Pain Inventory Average Pain 6 Pain Right Now 5 My pain is sharp, stabbing, and tingling  In the last 24 hours, has pain interfered with the following? General activity 3 Relation with others 0 Enjoyment of life 2 What TIME of day is your pain at its worst? varies Sleep (in general) Poor  Pain is worse with: walking Pain improves with: rest Relief from Meds: 8  Family History  Problem Relation Age of Onset   Diabetes Mother    Migraines Mother    Arrhythmia Father    COPD Father    Cancer Father    Diabetes Sister    Social History   Socioeconomic History   Marital status: Widowed    Spouse name: Not on file   Number of children: 2   Years of education: Not on file   Highest education level: Not on file  Occupational History   Not on file  Tobacco Use   Smoking status: Never   Smokeless tobacco: Never  Vaping Use   Vaping Use: Never used  Substance and Sexual Activity   Alcohol use: No   Drug use: Never   Sexual activity: Not on file  Other Topics Concern   Not on file  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   Social Determinants of Health   Financial Resource Strain: Not on file  Food Insecurity: Not on file  Transportation Needs: Not on file  Physical Activity: Not on file  Stress: Not on file  Social Connections: Not on file   Past Surgical History:  Procedure  Laterality Date   [place]   with Right Salpingo--ophorectomy   ANAL SPHINCTEROTOMY  01-05-2006    dr Rise Patience '@[facility]    BUNIONECTOMY  09-25-2009   dr Milinda Pointer '@[facility]    right great toe   CARPAL TUNNEL RELEASE Bilateral 1996;  [place]  12/ 2014   dr Milinda Pointer   left 2nd hammertoe repair,  left 2nd metatarsal matrixectomy, left heel endoscopic plantar fasiotomy   IR CATHETER TUBE CHANGE  01/01/2019   IR RADIOLOGIST EVAL & MGMT  11/28/2018   IR RADIOLOGIST EVAL & MGMT  11/29/2018   IR RADIOLOGIST EVAL & MGMT  12/06/2018   IR RADIOLOGIST EVAL & MGMT  12/25/2018   IR RADIOLOGIST EVAL & MGMT  01/08/2019   IR RADIOLOGIST EVAL & MGMT  01/29/2019   PANNICULECTOMY N/A 10/26/2018   Procedure: PANNICULECTOMY;  Surgeon: Johnathan Hausen, MD;  Location: [facility];  Service: General;  Laterality: N/A;   TENDON REPAIR  09/2015   left posterior tibial and peroneal tendon repairs   TOE AMPUTATION  01/2015   right 2nd toe   TONSILLECTOMY  age 55   TRANSOBTURATOR SLING  01-08-2004   dr Jeffie Pollock '@[facility]    Past Surgical History:  Procedure  Laterality Date   ABDOMINAL HYSTERECTOMY  1990   with Right Salpingo--ophorectomy   ANAL SPHINCTEROTOMY  01-05-2006    dr Rise Patience '@[facility]    BUNIONECTOMY  09-25-2009   dr Milinda Pointer '@[facility]    right great toe   CARPAL TUNNEL RELEASE Bilateral 1996;  [place]  12/ 2014   dr hyatt   left 2nd hammertoe repair,  left 2nd metatarsal matrixectomy, left heel endoscopic plantar fasiotomy   IR CATHETER TUBE CHANGE  01/01/2019   IR RADIOLOGIST EVAL & MGMT  11/28/2018   IR RADIOLOGIST EVAL & MGMT  11/29/2018   IR RADIOLOGIST EVAL & MGMT  12/06/2018   IR RADIOLOGIST EVAL & MGMT  12/25/2018   IR RADIOLOGIST EVAL & MGMT  01/08/2019   IR RADIOLOGIST EVAL & MGMT  01/29/2019   PANNICULECTOMY N/A 10/26/2018   Procedure: PANNICULECTOMY;  Surgeon: Johnathan Hausen, MD;  [facility];  Service: General;  Laterality: N/A;    TENDON REPAIR  09/2015   left posterior tibial and peroneal tendon repairs   TOE AMPUTATION  01/2015   right 2nd toe   TONSILLECTOMY  age 16   TRANSOBTURATOR SLING  01-08-2004   dr Jeffie Pollock '@[facility]    Past Medical History:  Diagnosis Date   Anxiety    Arthritis    Chronic foot pain, left    GERD (gastroesophageal reflux disease)    History of anal fissures    Hyperlipidemia    Hypertension    Hypothyroidism    Insulin dependent type 2 diabetes mellitus ([place])    endrocrinologist-- dr Chalmers Cater   Migraines    Peripheral neuropathy    RA (rheumatoid arthritis) [facility])    rheumatologist-  dr Amil Amen   SUI (stress urinary incontinence, female)    Wears glasses    BP (!) 141/85   Pulse 80   Ht '5\' 9"'$  (1.753 m)   Wt 208 lb 6.4 oz (94.5 kg)   SpO2 95%   BMI 30.78 kg/m   Opioid Risk Score:   Fall Risk Score:  `1  Depression screen [facility] 2/9     03/11/2022    1:04 PM 09/02/2021    9:14 AM 08/12/2021    2:31 PM 12/23/2019    8:13 AM 04/03/2019    9:01 AM 01/11/2018    5:20 PM 12/29/2017    1:31 PM  Depression screen PHQ 2/9  Decreased Interest 0 0 0 0 '1 1 1  '$ Down, Depressed, Hopeless 0 0 1 0 0 2 2  PHQ - 2 Score 0 0 1 0 '1 3 3  '$ Altered sleeping   3      Tired, decreased energy   1      Change in appetite   3      Feeling bad or failure about yourself    0      Trouble concentrating   0      Moving slowly or fidgety/restless   0      Suicidal thoughts   0      PHQ-9 Score   8      Difficult doing work/chores   Not difficult at all         Review of Systems  Musculoskeletal:  Positive for back pain.       Left foot cramps Left arm pain Right lower arm pain  All other systems reviewed and are negative.     Objective:   Physical Exam Vitals and nursing note  reviewed.  Constitutional:      Appearance: Normal appearance.  Cardiovascular:     Rate and Rhythm: Normal rate and regular rhythm.     Pulses: Normal pulses.     Heart sounds: Normal heart sounds.  Pulmonary:      Effort: Pulmonary effort is normal.     Breath sounds: Normal breath sounds.  Musculoskeletal:     Cervical back: Normal range of motion and neck supple.     Comments: Normal Muscle Bulk and Muscle Testing Reveals:  Upper Extremities:Full  ROM and Muscle Strength 5/5 Lower Extremities: Full ROM and Muscle Strength 5/5  Bilateral Lower Extremity Flexion Produces Pain into her Bilateral Patella's Arises from Chair with ease Narrow Based Gait     Skin:    General: Skin is warm and dry.  Neurological:     Mental Status: She is alert and oriented to person, place, and time.  Psychiatric:        Mood and Affect: Mood normal.        Behavior: Behavior normal.         Assessment & Plan:  Right Lumbar Radiculitis/ Chronic Bilateral Low Back Pain without Sciatica:  Continue HEP as Tolerated. Continue current medication regimen. Continue to monitor. 08/26/2022 Painful Diabetic Neuropathy: Refilled: Tramadol 50 mg TID as needed for pain #270. Dr Letta Pate agrees with three month supply. Continue Gabapentin.08/26/2022 We will continue the opioid monitoring program, this consists of regular clinic visits, examinations, urine drug screen, pill counts as well as use of [place] Controlled Substance Reporting system. A 12 month History has been reviewed on the [place] Controlled Substance Reporting System on 08/26/2022   F/U in 6 months

## 2022-08-27 ENCOUNTER — Other Ambulatory Visit: Payer: 59

## 2022-08-29 ENCOUNTER — Other Ambulatory Visit (HOSPITAL_COMMUNITY): Payer: Self-pay

## 2022-08-30 ENCOUNTER — Ambulatory Visit: Payer: 59 | Admitting: Podiatry

## 2022-08-30 ENCOUNTER — Other Ambulatory Visit (HOSPITAL_COMMUNITY): Payer: Self-pay

## 2022-08-30 DIAGNOSIS — M5431 Sciatica, right side: Secondary | ICD-10-CM

## 2022-08-30 DIAGNOSIS — G5793 Unspecified mononeuropathy of bilateral lower limbs: Secondary | ICD-10-CM | POA: Diagnosis not present

## 2022-08-30 DIAGNOSIS — G5792 Unspecified mononeuropathy of left lower limb: Secondary | ICD-10-CM

## 2022-08-30 MED ORDER — CYCLOBENZAPRINE HCL 10 MG PO TABS
10.0000 mg | ORAL_TABLET | Freq: Three times a day (TID) | ORAL | 1 refills | Status: DC | PRN
  Filled 2022-08-30: qty 90, 30d supply, fill #0

## 2022-08-30 MED ORDER — TRIAMCINOLONE ACETONIDE 40 MG/ML IJ SUSP
20.0000 mg | Freq: Once | INTRAMUSCULAR | Status: AC
  Administered 2022-08-30: 20 mg

## 2022-08-30 MED ORDER — OMRON 3 SERIES BP MONITOR DEVI
0 refills | Status: DC
  Filled 2022-08-30: qty 1, 90d supply, fill #0

## 2022-08-30 NOTE — Progress Notes (Signed)
She presents today states that she started to develop numbness across the dorsal aspect of the foot really starts with a bad burning tingling superficial pain from back here she points from the ankle joint and she says it runs distally more into the third fourth and fifth toes.  She also brings the MRI report for her back which really does not demonstrate any significant abnormalities consistent with the sciatica type symptoms she is having in her right buttocks and down her thigh.  Objective: Vital signs are stable she alert and oriented x3 pulses are palpable to the left foot.  She does have loss of sensation distally around the dorsal aspect of the toes per Semmes Weinstein monofilament but allodynic type pain on palpation just distal to the ankle joint where the visible portion of the medial and intermediate dorsal cutaneous nerves are.  Assessment: Most likely neuropathy extending from dorsal distal ankle area distally to the toes and then sciatica in the right hip and buttocks and thigh.  Plan: At this point were going to refill her Flexeril for the cramping in her left foot.  We are going to refer her to neurology for an evaluation and treatment of the neuropathy or sciatica.  I am also going to inject the dorsal aspect of the foot at the level of the allodynia to see if this will alleviate her symptoms.  We will follow-up with her in December for removal of her cyst from her toe.

## 2022-08-31 ENCOUNTER — Encounter: Payer: Self-pay | Admitting: Podiatry

## 2022-08-31 ENCOUNTER — Other Ambulatory Visit (HOSPITAL_COMMUNITY): Payer: Self-pay

## 2022-08-31 DIAGNOSIS — G5793 Unspecified mononeuropathy of bilateral lower limbs: Secondary | ICD-10-CM

## 2022-08-31 DIAGNOSIS — G5792 Unspecified mononeuropathy of left lower limb: Secondary | ICD-10-CM

## 2022-08-31 DIAGNOSIS — M5431 Sciatica, right side: Secondary | ICD-10-CM

## 2022-09-07 ENCOUNTER — Other Ambulatory Visit (HOSPITAL_COMMUNITY): Payer: Self-pay

## 2022-09-07 DIAGNOSIS — Z6830 Body mass index (BMI) 30.0-30.9, adult: Secondary | ICD-10-CM | POA: Diagnosis not present

## 2022-09-07 DIAGNOSIS — E669 Obesity, unspecified: Secondary | ICD-10-CM | POA: Diagnosis not present

## 2022-09-07 DIAGNOSIS — M0589 Other rheumatoid arthritis with rheumatoid factor of multiple sites: Secondary | ICD-10-CM | POA: Diagnosis not present

## 2022-09-07 DIAGNOSIS — M79644 Pain in right finger(s): Secondary | ICD-10-CM | POA: Diagnosis not present

## 2022-09-07 DIAGNOSIS — M1991 Primary osteoarthritis, unspecified site: Secondary | ICD-10-CM | POA: Diagnosis not present

## 2022-09-07 DIAGNOSIS — Z79899 Other long term (current) drug therapy: Secondary | ICD-10-CM | POA: Diagnosis not present

## 2022-09-08 ENCOUNTER — Other Ambulatory Visit (HOSPITAL_COMMUNITY): Payer: Self-pay

## 2022-09-08 ENCOUNTER — Other Ambulatory Visit: Payer: Self-pay | Admitting: Podiatry

## 2022-09-08 MED ORDER — CYCLOBENZAPRINE HCL 10 MG PO TABS
10.0000 mg | ORAL_TABLET | Freq: Three times a day (TID) | ORAL | 0 refills | Status: DC | PRN
  Filled 2022-09-08: qty 270, 90d supply, fill #0

## 2022-09-12 DIAGNOSIS — Z13820 Encounter for screening for osteoporosis: Secondary | ICD-10-CM | POA: Diagnosis not present

## 2022-09-12 DIAGNOSIS — Z1231 Encounter for screening mammogram for malignant neoplasm of breast: Secondary | ICD-10-CM | POA: Diagnosis not present

## 2022-09-15 ENCOUNTER — Other Ambulatory Visit (HOSPITAL_COMMUNITY): Payer: Self-pay

## 2022-09-16 ENCOUNTER — Other Ambulatory Visit (HOSPITAL_COMMUNITY): Payer: Self-pay

## 2022-09-16 MED ORDER — VALSARTAN 320 MG PO TABS
320.0000 mg | ORAL_TABLET | Freq: Every day | ORAL | 0 refills | Status: DC
  Filled 2022-09-16 (×2): qty 90, 90d supply, fill #0

## 2022-09-19 ENCOUNTER — Other Ambulatory Visit (HOSPITAL_COMMUNITY): Payer: Self-pay

## 2022-09-20 ENCOUNTER — Other Ambulatory Visit (HOSPITAL_COMMUNITY): Payer: Self-pay

## 2022-09-20 DIAGNOSIS — M5417 Radiculopathy, lumbosacral region: Secondary | ICD-10-CM | POA: Diagnosis not present

## 2022-09-20 DIAGNOSIS — M4316 Spondylolisthesis, lumbar region: Secondary | ICD-10-CM | POA: Diagnosis not present

## 2022-09-20 DIAGNOSIS — Z6830 Body mass index (BMI) 30.0-30.9, adult: Secondary | ICD-10-CM | POA: Diagnosis not present

## 2022-09-26 ENCOUNTER — Ambulatory Visit: Payer: 59 | Admitting: Neurology

## 2022-09-26 ENCOUNTER — Encounter: Payer: Self-pay | Admitting: Neurology

## 2022-09-26 ENCOUNTER — Other Ambulatory Visit (HOSPITAL_COMMUNITY): Payer: Self-pay

## 2022-09-26 VITALS — BP 140/80 | HR 59 | Ht 69.0 in | Wt 206.0 lb

## 2022-09-26 DIAGNOSIS — R202 Paresthesia of skin: Secondary | ICD-10-CM | POA: Diagnosis not present

## 2022-09-26 DIAGNOSIS — M5416 Radiculopathy, lumbar region: Secondary | ICD-10-CM

## 2022-09-26 NOTE — Patient Instructions (Signed)
ELECTROMYOGRAM AND NERVE CONDUCTION STUDIES (EMG/NCS) INSTRUCTIONS  How to Prepare The neurologist conducting the EMG will need to know if you have certain medical conditions. Tell the neurologist and other EMG lab personnel if you: . Have a pacemaker or any other electrical medical device . Take blood-thinning medications . Have hemophilia, a blood-clotting disorder that causes prolonged bleeding Bathing Take a shower or bath shortly before your exam in order to remove oils from your skin. Don't apply lotions or creams before the exam.  What to Expect You'll likely be asked to change into a hospital gown for the procedure and lie down on an examination table. The following explanations can help you understand what will happen during the exam.  . Electrodes. The neurologist or a technician places surface electrodes at various locations on your skin depending on where you're experiencing symptoms. Or the neurologist may insert needle electrodes at different sites depending on your symptoms.  . Sensations. The electrodes will at times transmit a tiny electrical current that you may feel as a twinge or spasm. The needle electrode may cause discomfort or pain that usually ends shortly after the needle is removed. If you are concerned about discomfort or pain, you may want to talk to the neurologist about taking a short break during the exam.  . Instructions. During the needle EMG, the neurologist will assess whether there is any spontaneous electrical activity when the muscle is at rest - activity that isn't present in healthy muscle tissue - and the degree of activity when you slightly contract the muscle.  He or she will give you instructions on resting and contracting a muscle at appropriate times. Depending on what muscles and nerves the neurologist is examining, he or she may ask you to change positions during the exam.  After your EMG You may experience some temporary, minor bruising where the  needle electrode was inserted into your muscle. This bruising should fade within several days. If it persists, contact your primary care doctor.    

## 2022-09-26 NOTE — Progress Notes (Signed)
Amy Adams   Date: 09/26/2022   Amy Adams MRN: [id number] DOB: 11-08-1966   Dear Dr. Milinda Pointer:  Thank you for your kind referral of Amy Adams for consultation of left foot pain. Although her history is well known to you, please allow [place] to reiterate it for the purpose of our medical record. The patient was accompanied to the clinic by self.    Amy Adams is a 56 y.o. right-handed female with diabetes mellitus, hypothyroidism, RA, GERD, hypertension, hyperlipidemia, s/p left L4-5 laminectomy and synovial cyst resection, and chronic pain presenting for evaluation of left foot pain.   IMPRESSION/PLAN: Left foot paresthesias and pain corresponds to L5 dermatome.  Symptoms are not consistent with a peripheral neuropathy, however, she does have risk factors for neuropathy (diabetes, RA).  Another possibility is peroneal neuropathy. She had synovial cyst resection and laminectomy in in 2022 at L4-5 and its possible she may have some residual radicular symptoms or dynamic nerve impingement.  Interestingly, her MRI lumbar spine from October did not clearly show nerve impingement.  To further evaluation, she will return for NCS/EMG of the left > right leg. She is followed by pain management for symptom management.  Further recommendations pending results.   ------------------------------------------------------------- History of present illness: Starting around September 2023, she began having hypersensitivity over the top of the left foot.  Symptoms are constant and painful.   It is worse when bed sheets touch her foot and improved when she wears socks.  She has numbness involving the entire foot for the past few years.  She reports falling and fracturing her ankle, but did not feel the pain so walked on it for about 3 months.  She has history of musculoskeletal injury and multiple surgeries on the left foot, including 2nd toe  amputation.  She has low back surgery in 2022 at L4-5 laminectomy and synovial cyst resection by Dr. Ronnald Ramp.  She has an updated MRI lumbar spine last month and was recommended to undergo surgery.   She denies any numbness/tingling of the right foot.    She is followed by pain management and takes cymbalta '60mg'$ , gabapentin '400mg'$  TID, lidocaine patch,  tramadol '50mg'$  TID, and flexeril '10mg'$ .  Out-side paper records, electronic medical record, and images have been reviewed where available and summarized as:  MRI lumbar spine wo contrast 08/23/2022: 1. Left laminectomy L4-5 with resection of synovial cyst. No recurrent synovial cyst. Improvement in subarticular stenosis on the left. 2. Mild facet degeneration L5-S1 without neural impingement. 3. No cause for right leg radiculopathy identified.  MRI lumbar spine wo contrast 05/29/2021: L4-5 and L5-S1 facet osteoarthritis with mild anterolisthesis. At L4-5 there is a 5 mm left facet synovial cyst which compresses the left L5 nerve root at the subarticular recess.   Lab Results  Component Value Date   HGBA1C 6.5 (H) 10/22/2018   No results found for: "VITAMINB12" No results found for: "TSH" No results found for: "ESRSEDRATE", "POCTSEDRATE"  Past Medical History:  Diagnosis Date   Anxiety    Arthritis    Chronic foot pain, left    GERD (gastroesophageal reflux disease)    History of anal fissures    Hyperlipidemia    Hypertension    Hypothyroidism    Insulin dependent type 2 diabetes mellitus ([place])    endrocrinologist-- dr Chalmers Cater   Migraines    Peripheral neuropathy    RA (rheumatoid arthritis) [facility])    rheumatologist-  dr Amil Amen  SUI (stress urinary incontinence, female)    Wears glasses     Past Surgical History:  Procedure Laterality Date   ABDOMINAL HYSTERECTOMY  1990   with Right Salpingo--ophorectomy   ANAL SPHINCTEROTOMY  01-05-2006    dr Rise Patience '@[facility]    BUNIONECTOMY  09-25-2009   dr Milinda Pointer '@[facility]    right great toe    CARPAL TUNNEL RELEASE Bilateral 1996;  [place]  12/ 2014   dr Milinda Pointer   left 2nd hammertoe repair,  left 2nd metatarsal matrixectomy, left heel endoscopic plantar fasiotomy   IR CATHETER TUBE CHANGE  01/01/2019   IR RADIOLOGIST EVAL & MGMT  11/28/2018   IR RADIOLOGIST EVAL & MGMT  11/29/2018   IR RADIOLOGIST EVAL & MGMT  12/06/2018   IR RADIOLOGIST EVAL & MGMT  12/25/2018   IR RADIOLOGIST EVAL & MGMT  01/08/2019   IR RADIOLOGIST EVAL & MGMT  01/29/2019   PANNICULECTOMY N/A 10/26/2018   Procedure: PANNICULECTOMY;  Surgeon: Johnathan Hausen, MD;  Location: [facility];  Service: General;  Laterality: N/A;   TENDON REPAIR  09/2015   left posterior tibial and peroneal tendon repairs   TOE AMPUTATION  01/2015   right 2nd toe   TONSILLECTOMY  age 24   TRANSOBTURATOR SLING  01-08-2004   dr Jeffie Pollock '@[facility]'$      Medications:  Outpatient Encounter Medications as of 09/26/2022  Medication Sig Note   albuterol (VENTOLIN HFA) 108 (90 Base) MCG/ACT inhaler Inhale 2 puffs into the lungs every 6 (six) hours as needed.    Alcohol Swabs (EASY TOUCH ALCOHOL PREP MEDIUM) 70 % PADS Place 1 Application onto the skin 4 (four) times daily for glucose testing    ALPRAZolam (XANAX) 1 MG tablet Take 1 tablet (1 mg total) by mouth 3 (three) times daily as needed.    Blood Pressure Monitoring (OMRON 3 SERIES BP MONITOR) DEVI Use as directed    Continuous Blood Gluc Sensor (DEXCOM G7 SENSOR) MISC Inject into the skin as directed - every 10 days    cyclobenzaprine (FLEXERIL) 10 MG tablet Take 1 tablet (10 mg total) by mouth 3 (three) times daily as needed for muscle spasms.    DULoxetine (CYMBALTA) 60 MG capsule 1 capsule    fenofibrate micronized (LOFIBRA) 134 MG capsule Take 1 capsule (134 mg total) by mouth daily.    folic acid (FOLVITE) 1 MG tablet Take 2 tablets (2 mg total) by mouth daily.    gabapentin (NEURONTIN) 400 MG capsule Take 1 capsule (400 mg total) by mouth 3 (three) times daily.     hydrochlorothiazide (HYDRODIURIL) 25 MG tablet Take 1 tablet (25 mg total) by mouth daily. (Patient taking differently: Take 25 mg by mouth daily. Every othe day)    hydroxychloroquine (PLAQUENIL) 200 MG tablet Take 2 tablets (400 mg total) by mouth daily with food or milk    levocetirizine (XYZAL) 5 MG tablet Take 1 tablet (5 mg total) by mouth every evening.    levothyroxine (SYNTHROID) 50 MCG tablet Take 1 tablet (50 mcg total) by mouth in the morning on an empty stomach for 90 days.    lidocaine (LIDODERM) 5 % Place 1 patch onto the skin once daily, remove after 12 (twelve) hours.    metFORMIN (GLUCOPHAGE-XR) 500 MG 24 hr tablet Take 2 tablets (1,000 mg total) by mouth 2 (two) times daily.    methotrexate 50 MG/2ML injection Inject 0.8 mLs (20 mg total) into the skin once a week  as directed    montelukast (SINGULAIR) 10 MG tablet Take 1 tablet (10 mg total) by mouth every evening.    tirzepatide (MOUNJARO) 12.5 MG/0.5ML Pen Inject 12.5 mg into the skin once a week.    traMADol (ULTRAM) 50 MG tablet Take 1 tablet (50 mg total) by mouth 3 (three) times daily as needed.    TUBERCULIN SYR 1CC/27GX1/2" 27G X 1/2" 1 ML MISC Inject into the skin once a week with methotrexate    UNIFINE PENTIPS 31G X 5 MM MISC USE AS DIRECTED 5 TIMES PER DAY    valACYclovir (VALTREX) 1000 MG tablet Take 2 tablets by mouth twice a day for 1 day when needed    valsartan (DIOVAN) 320 MG tablet Take 1 tablet (320 mg total) by mouth daily.    zolpidem (AMBIEN CR) 12.5 MG CR tablet Take 1 tablet (12.5 mg total) by mouth at bedtime as needed.    insulin lispro (HUMALOG) 100 UNIT/ML injection Inject 6-15 Units into the skin 3 (three) times daily with meals. 6-15 units sliding scale AS NEEDED (Patient not taking: Reported on 09/26/2022) 04/03/2019: She states she has not needed meal time insulin but once in the past week.    NEOMYCIN-POLYMYXIN-HYDROCORTISONE (CORTISPORIN) 1 % SOLN OTIC solution Apply 1-2 drops to toe 2 times   a day after soaking (Patient not taking: Reported on 09/26/2022)    Semaglutide, 2 MG/DOSE, (OZEMPIC, 2 MG/DOSE,) 8 MG/3ML SOPN Inject 2 mg into the skin once a week. (Patient not taking: Reported on 09/26/2022)    valsartan (DIOVAN) 320 MG tablet Take 1 tablet (320 mg total) by mouth daily. (Patient not taking: Reported on 09/26/2022)    No facility-administered encounter medications on file as of 09/26/2022.    Allergies:  Allergies  Allergen Reactions   Dilaudid [Hydromorphone Hcl] Nausea And Vomiting    Hard to wake up   Bupropion     Other reaction(s): possibly   Doxycycline Nausea And Vomiting   Hydromorphone     Other reaction(s): Unknown   Topiramate Other (See Comments)    Suicidal thoughts per patient   Trazodone And Nefazodone Other (See Comments)    Hallucinations/ suicidal thoughts    Family History: Family History  Problem Relation Age of Onset   Diabetes Mother    Migraines Mother    Arrhythmia Father    COPD Father    Cancer Father    Diabetes Sister     Social History: Social History   Tobacco Use   Smoking status: Never   Smokeless tobacco: Never  Vaping Use   Vaping Use: Never used  Substance Use Topics   Alcohol use: No   Drug use: Never   Social History   Social History Narrative   [place]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   Are you right handed or left handed? Right   Are you currently employed ? [employer]   What is your current occupation?   Do you live at home alone? yes   Caffeine 2 weekly soda   What type of home do you live in: 1 story or 2 story? one        Vital Signs:  BP (!) 140/80   Pulse (!) 59   Ht '5\' 9"'$  (1.753 m)   Wt 206 lb (93.4 kg)   SpO2 99%   BMI 30.42 kg/m    Neurological Exam: MENTAL STATUS including orientation to time, place, person, recent and remote memory, attention span and concentration, language, and fund of knowledge  is normal.  Speech is not dysarthric.  CRANIAL NERVES: II:  No visual field defects.    III-IV-VI: Pupils equal round and reactive to light.  Normal conjugate, extra-ocular eye movements in all directions of gaze.  No nystagmus.  No ptosis.   V:  Normal facial sensation.    VII:  Normal facial symmetry and movements.   VIII:  Normal hearing and vestibular function.   IX-X:  Normal palatal movement.   XI:  Normal shoulder shrug and head rotation.   XII:  Normal tongue strength and range of motion, no deviation or fasciculation.  MOTOR:  No atrophy, fasciculations or abnormal movements.  No pronator drift.   Upper Extremity:  Right  Left  Deltoid  5/5   5/5   Biceps  5/5   5/5   Triceps  5/5   5/5   Wrist extensors  5/5   5/5   Wrist flexors  5/5   5/5   Finger extensors  5/5   5/5   Finger flexors  5/5   5/5   Dorsal interossei  5/5   5/5   Tone (Ashworth scale)  0  0   Lower Extremity:  Right  Left  Hip flexors  5/5   5/5   Knee flexors  5/5   5/5   Knee extensors  5/5   5/5   Dorsiflexors  5/5   5/5   Plantarflexors  5/5   5/5   Inversion 5/5  5-/5  Eversion 5/5  5-/5  Toe extensors  5/5   4/5   Toe flexors  5/5   5/5   Tone (Ashworth scale)  0  0   MSRs:                                           Right        Left brachioradialis 2+  2+  biceps 2+  2+  triceps 2+  2+  patellar 1+  1+  ankle jerk 2+  2+  plantar response down  down   SENSORY:  Hyperesthesia to pin prick and temperature over the dorsum of the left foot.  Vibration intact throughout. Romberg's sign absent.   COORDINATION/GAIT: Normal finger-to- nose-finger.  Intact rapid alternating movements bilaterally.    Gait narrow based and stable. Tandem and stressed gait intact.     Thank you for allowing me to participate in patient's care.  If I can answer any additional questions, I would be pleased to do so.    Sincerely,    Christepher Melchior K. Posey Pronto, DO

## 2022-09-27 ENCOUNTER — Other Ambulatory Visit (HOSPITAL_COMMUNITY): Payer: Self-pay

## 2022-09-27 ENCOUNTER — Ambulatory Visit: Payer: 59 | Admitting: Neurology

## 2022-09-27 DIAGNOSIS — R202 Paresthesia of skin: Secondary | ICD-10-CM

## 2022-09-27 DIAGNOSIS — M5416 Radiculopathy, lumbar region: Secondary | ICD-10-CM | POA: Diagnosis not present

## 2022-09-27 MED ORDER — ALPRAZOLAM 1 MG PO TABS
1.0000 mg | ORAL_TABLET | Freq: Three times a day (TID) | ORAL | 0 refills | Status: DC | PRN
  Filled 2022-09-27 – 2022-10-03 (×4): qty 270, 90d supply, fill #0

## 2022-09-27 NOTE — Progress Notes (Signed)
Follow-up Visit   Date: 09/27/2022    AZRA ABRELL MRN: [id number] DOB: 11/13/1966    Amy Adams is a 56 y.o. right-handed Caucasian female with diabetes mellitus, hypothroidism, RA, GERD, HTN, HPL, s/p left L4-5 laminectomy and synovial cyst resection, and chronic pain returning to the clinic for follow-up of left foot paresthesias.  The patient was accompanied to the clinic by self.  IMPRESSION/PLAN: Left foot paresthesias.  Results of EMG discussed with patient which shows L5 radiculopathy. No evidence of peripheral neuropathy or peroneal neuropathy.  She will follow-up with Dr. Ronnald Ramp (neurosurgery) and pain management.     Medications:  Current Outpatient Medications on File Prior to Visit  Medication Sig Dispense Refill   albuterol (VENTOLIN HFA) 108 (90 Base) MCG/ACT inhaler Inhale 2 puffs into the lungs every 6 (six) hours as needed. 54 g 3   Alcohol Swabs (EASY TOUCH ALCOHOL PREP MEDIUM) 70 % PADS Place 1 Application onto the skin 4 (four) times daily for glucose testing 400 each 4   ALPRAZolam (XANAX) 1 MG tablet Take 1 tablet (1 mg total) by mouth 3 (three) times daily as needed. 270 tablet 1   ALPRAZolam (XANAX) 1 MG tablet Take 1 tablet (1 mg total) by mouth 3 (three) times daily as needed. 270 tablet 1   Blood Pressure Monitoring (OMRON 3 SERIES BP MONITOR) DEVI Use as directed 1 each 0   Continuous Blood Gluc Sensor (DEXCOM G7 SENSOR) MISC Inject into the skin as directed - every 10 days 9 each 4   cyclobenzaprine (FLEXERIL) 10 MG tablet Take 1 tablet (10 mg total) by mouth 3 (three) times daily as needed for muscle spasms. 270 tablet 0   DULoxetine (CYMBALTA) 60 MG capsule Take 1 capsule (60 mg total) by mouth 2 (two) times daily. 180 capsule 3   DULoxetine (CYMBALTA) 60 MG capsule 1 capsule     fenofibrate micronized (LOFIBRA) 134 MG capsule Take 1 capsule (134 mg total) by mouth daily. 90 capsule 3   folic acid (FOLVITE) 1 MG tablet Take 2 tablets (2 mg  total) by mouth daily. 180 tablet 3   gabapentin (NEURONTIN) 400 MG capsule Take 1 capsule (400 mg total) by mouth 3 (three) times daily. 270 capsule 4   hydrochlorothiazide (HYDRODIURIL) 25 MG tablet Take 1 tablet (25 mg total) by mouth daily. (Patient taking differently: Take 25 mg by mouth daily. Every othe day) 45 tablet 4   hydroxychloroquine (PLAQUENIL) 200 MG tablet Take 2 tablets (400 mg total) by mouth daily with food or milk 180 tablet 3   insulin lispro (HUMALOG) 100 UNIT/ML injection Inject 6-15 Units into the skin 3 (three) times daily with meals. 6-15 units sliding scale AS NEEDED (Patient not taking: Reported on 09/26/2022)     levocetirizine (XYZAL) 5 MG tablet Take 1 tablet (5 mg total) by mouth every evening. 90 tablet 3   levothyroxine (SYNTHROID) 50 MCG tablet Take 1 tablet (50 mcg total) by mouth in the morning on an empty stomach for 90 days. 90 tablet 4   lidocaine (LIDODERM) 5 % Place 1 patch onto the skin once daily, remove after 12 (twelve) hours. 60 patch 3   metFORMIN (GLUCOPHAGE-XR) 500 MG 24 hr tablet Take 2 tablets (1,000 mg total) by mouth 2 (two) times daily. 360 tablet 4   methotrexate 50 MG/2ML injection Inject 0.8 mLs (20 mg total) into the skin once a week as directed 6 mL 1   montelukast (SINGULAIR) 10 MG tablet  Take 1 tablet (10 mg total) by mouth every evening. 90 tablet 3   NEOMYCIN-POLYMYXIN-HYDROCORTISONE (CORTISPORIN) 1 % SOLN OTIC solution Apply 1-2 drops to toe 2 times  a day after soaking (Patient not taking: Reported on 09/26/2022) 10 mL 1   Semaglutide, 2 MG/DOSE, (OZEMPIC, 2 MG/DOSE,) 8 MG/3ML SOPN Inject 2 mg into the skin once a week. (Patient not taking: Reported on 09/26/2022) 3 mL 6   tirzepatide (MOUNJARO) 12.5 MG/0.5ML Pen Inject 12.5 mg into the skin once a week. 6 mL 4   traMADol (ULTRAM) 50 MG tablet Take 1 tablet (50 mg total) by mouth 3 (three) times daily as needed. 270 tablet 0   TUBERCULIN SYR 1CC/27GX1/2" 27G X 1/2" 1 ML MISC Inject  into the skin once a week with methotrexate 13 each 3   UNIFINE PENTIPS 31G X 5 MM MISC USE AS DIRECTED 5 TIMES PER DAY  4   valACYclovir (VALTREX) 1000 MG tablet Take 2 tablets by mouth twice a day for 1 day when needed 12 tablet 3   valsartan (DIOVAN) 320 MG tablet Take 1 tablet (320 mg total) by mouth daily. (Patient not taking: Reported on 09/26/2022) 90 tablet 0   valsartan (DIOVAN) 320 MG tablet Take 1 tablet (320 mg total) by mouth daily. 90 tablet 0   zolpidem (AMBIEN CR) 12.5 MG CR tablet Take 1 tablet (12.5 mg total) by mouth at bedtime as needed. 90 tablet 1   No current facility-administered medications on file prior to visit.    Allergies:  Allergies  Allergen Reactions   Dilaudid [Hydromorphone Hcl] Nausea And Vomiting    Hard to wake up   Bupropion     Other reaction(s): possibly   Doxycycline Nausea And Vomiting   Hydromorphone     Other reaction(s): Unknown   Topiramate Other (See Comments)    Suicidal thoughts per patient   Trazodone And Nefazodone Other (See Comments)    Hallucinations/ suicidal thoughts    Vital Signs:  There were no vitals taken for this visit.   Neurological Exam: Deferred  Data: NCS/EMG of the legs 09/27/2022: Chronic left L5 radiculopathy, mild. There is no evidence of a sensorimotor polyneuropathy affecting the lower extremities.  MRI lumbar spine wo contrast 08/23/2022: 1. Left laminectomy L4-5 with resection of synovial cyst. No recurrent synovial cyst. Improvement in subarticular stenosis on the left. 2. Mild facet degeneration L5-S1 without neural impingement. 3. No cause for right leg radiculopathy identified.   MRI lumbar spine wo contrast 05/29/2021: L4-5 and L5-S1 facet osteoarthritis with mild anterolisthesis. At L4-5 there is a 5 mm left facet synovial cyst which compresses the left L5 nerve root at the subarticular recess.   Thank you for allowing me to participate in patient's care.  If I can answer any additional  questions, I would be pleased to do so.    Sincerely,    Rhen Dossantos K. Posey Pronto, DO

## 2022-09-27 NOTE — Procedures (Signed)
[facility]  [place], [place]  [place], [place] [address] Tel: [phone] Fax:  [phone] Test Date:  09/27/2022  Patient: Amy Adams DOB: 09-Jan-1966 Physician: Arvin Collard Alliyah Roesler,DO  Sex: Female Height: '5\' 9"'$  Ref Phys: Narda Amber, DO  ID#: [id number]   Technician:    Patient Complaints: This is a 56 year old female referred for evaluation of left foot paresthesias.  NCV & EMG Findings: Extensive electrodiagnostic testing of the left lower extremity and additional studies of the right shows:  Bilateral sural and superficial peroneal sensory responses are within normal limits. Bilateral peroneal and tibial motor responses are within normal limits. Bilateral tibial H reflex studies are within normal limits. Chronic motor axonal loss changes are seen affecting the left L5 myotome, without accompanying active denervation.  Impression: Chronic left L5 radiculopathy, mild. There is no evidence of a sensorimotor polyneuropathy affecting the lower extremities.   ___________________________ Arvin Collard Tamico Mundo,DO    Nerve Conduction Studies Anti Sensory Summary Table   Stim Site NR Peak (ms) Norm Peak (ms) O-P Amp (V) Norm O-P Amp  Left Sup Peroneal Anti Sensory (Ant Lat Mall)  32C  12 cm    3.2 <4.6 6.6 >4  Right Sup Peroneal Anti Sensory (Ant Lat Mall)  32C  12 cm    3.2 <4.6 8.5 >4  Left Sural Anti Sensory (Lat Mall)  32C  Calf    3.2 <4.6 9.7 >4  Right Sural Anti Sensory (Lat Mall)  32C  Calf    3.0 <4.6 10.0 >4   Motor Summary Table   Stim Site NR Onset (ms) Norm Onset (ms) O-P Amp (mV) Norm O-P Amp Site1 Site2 Delta-0 (ms) Dist (cm) Vel (m/s) Norm Vel (m/s)  Left Peroneal Motor (Ext Dig Brev)  32C  Ankle    4.5 <6.0 3.0 >2.5 B Fib Ankle 8.4 34.0 40 >40  B Fib    12.9  3.0  Poplt B Fib 1.7 8.0 47 >40  Poplt    14.6  2.9         Right Peroneal Motor (Ext Dig Brev)  32C  Ankle    4.3 <6.0 4.2 >2.5 B Fib Ankle 8.0 37.0 46 >40  B Fib    12.3  4.2   Poplt B Fib 1.8 8.0 44 >40  Poplt    14.1  3.9         Left Peroneal TA Motor (Tib Ant)  32C  Fib Head    2.5 <4.5 5.8 >3 Poplit Fib Head 1.5 8.0 53 >40  Poplit    4.0  5.4         Right Peroneal TA Motor (Tib Ant)  32C  Fib Head    2.7 <4.5 5.8 >3 Poplit Fib Head 1.2 7.0 58 >40  Poplit    3.9  5.6         Left Tibial Motor (Abd Hall Brev)  32C  Ankle    3.0 <6.0 9.6 >4 Knee Ankle 10.0 42.0 42 >40  Knee    13.0  7.4         Right Tibial Motor (Abd Hall Brev)  32C  Ankle    3.7 <6.0 9.7 >4 Knee Ankle 10.5 42.0 40 >40  Knee    14.2  7.1          H Reflex Studies   NR H-Lat (ms) Lat Norm (ms) L-R H-Lat (ms)  Left Tibial (Gastroc)  32C     33.47 <35 1.50  Right  Tibial (Gastroc)  32C     34.97 <35 1.50   EMG   Side Muscle Ins Act Fibs Fasc Recrt Dur. Amp. Poly. Activation Comment  Left AntTibialis Nml Nml Nml 1- 1+ 1+ 1+ Nml N/A  Left Flex Dig Long Nml Nml Nml 1- 1+ 1+ 1+ Nml N/A  Left GluteusMed Nml Nml Nml 1- 1+ 1+ 1+ Nml N/A  Left RectFemoris Nml Nml Nml Nml Nml Nml Nml Nml N/A  Left Gastroc Nml Nml Nml Nml Nml Nml Nml Nml N/A  Right AntTibialis Nml Nml Nml Nml Nml Nml Nml Nml N/A  Right Gastroc Nml Nml Nml Nml Nml Nml Nml Nml N/A  Right Flex Dig Long Nml Nml Nml Nml Nml Nml Nml Nml N/A  Right RectFemoris Nml Nml Nml Nml Nml Nml Nml Nml N/A  Right GluteusMed Nml Nml Nml Nml Nml Nml Nml Nml N/A      Waveforms:

## 2022-09-28 ENCOUNTER — Other Ambulatory Visit (HOSPITAL_COMMUNITY): Payer: Self-pay

## 2022-09-30 ENCOUNTER — Other Ambulatory Visit (HOSPITAL_COMMUNITY): Payer: Self-pay

## 2022-10-03 ENCOUNTER — Other Ambulatory Visit (HOSPITAL_COMMUNITY): Payer: Self-pay

## 2022-10-03 ENCOUNTER — Encounter: Payer: Self-pay | Admitting: Neurology

## 2022-10-05 DIAGNOSIS — Z01419 Encounter for gynecological examination (general) (routine) without abnormal findings: Secondary | ICD-10-CM | POA: Diagnosis not present

## 2022-10-05 DIAGNOSIS — Z6830 Body mass index (BMI) 30.0-30.9, adult: Secondary | ICD-10-CM | POA: Diagnosis not present

## 2022-10-10 ENCOUNTER — Encounter: Payer: Self-pay | Admitting: Neurology

## 2022-10-10 ENCOUNTER — Ambulatory Visit: Payer: 59 | Admitting: Neurology

## 2022-10-10 ENCOUNTER — Other Ambulatory Visit: Payer: Self-pay | Admitting: Neurological Surgery

## 2022-10-10 VITALS — BP 160/85 | HR 78 | Ht 69.0 in | Wt 205.0 lb

## 2022-10-10 DIAGNOSIS — M5416 Radiculopathy, lumbar region: Secondary | ICD-10-CM | POA: Diagnosis not present

## 2022-10-10 NOTE — Progress Notes (Signed)
Follow-up Visit   Date: 10/10/2022    Amy Adams MRN: [id number] DOB: [birth year]    Amy Adams is a 56 y.o. right-handed Caucasian female with diabetes mellitus, hypothroidism, RA, GERD, HTN, HPL, s/p left L4-5 laminectomy and synovial cyst resection, and chronic pain returning to the clinic for follow-up of left foot paresthesias.  The patient was accompanied to the clinic by self.  IMPRESSION/PLAN: Left foot paresthesias and pain.  Results of EMG discussed with patient which shows L5 radiculopathy. No evidence of peripheral neuropathy or peroneal neuropathy.  Explained that her pain would not be caused by her recent NCS/EMG and instead may be worsening from underlying structural disease. She will follow-up with Dr. Ronnald Ramp (neurosurgery) and pain management.   -------------------------------------------- UPDATE 10/10/2022:  She contact the office last week because of worsening left foot pain following her EMG. Pain is not over any of the needle insertion sites, but moreso the same location has it has always involved.  Her numbness is extended into the lower leg past the ankle.  She has pain over the dorsum of the foot.  She also has cramps in the toes and feet.    Medications:  Current Outpatient Medications on File Prior to Visit  Medication Sig Dispense Refill   albuterol (VENTOLIN HFA) 108 (90 Base) MCG/ACT inhaler Inhale 2 puffs into the lungs every 6 (six) hours as needed. 54 g 3   Alcohol Swabs (EASY TOUCH ALCOHOL PREP MEDIUM) 70 % PADS Place 1 Application onto the skin 4 (four) times daily for glucose testing 400 each 4   ALPRAZolam (XANAX) 1 MG tablet Take 1 tablet (1 mg total) by mouth 3 (three) times daily as needed. 270 tablet 1   ALPRAZolam (XANAX) 1 MG tablet Take 1 tablet (1 mg total) by mouth 3 (three) times daily as needed. 270 tablet 1   ALPRAZolam (XANAX) 1 MG tablet Take 1 tablet (1 mg total) by mouth 3 (three) times daily as needed. 270 tablet 0   Blood  Pressure Monitoring (OMRON 3 SERIES BP MONITOR) DEVI Use as directed 1 each 0   Continuous Blood Gluc Sensor (DEXCOM G7 SENSOR) MISC Inject into the skin as directed - every 10 days 9 each 4   cyclobenzaprine (FLEXERIL) 10 MG tablet Take 1 tablet (10 mg total) by mouth 3 (three) times daily as needed for muscle spasms. 270 tablet 0   DULoxetine (CYMBALTA) 60 MG capsule Take 1 capsule (60 mg total) by mouth 2 (two) times daily. 180 capsule 3   DULoxetine (CYMBALTA) 60 MG capsule 1 capsule     fenofibrate micronized (LOFIBRA) 134 MG capsule Take 1 capsule (134 mg total) by mouth daily. 90 capsule 3   folic acid (FOLVITE) 1 MG tablet Take 2 tablets (2 mg total) by mouth daily. 180 tablet 3   gabapentin (NEURONTIN) 400 MG capsule Take 1 capsule (400 mg total) by mouth 3 (three) times daily. 270 capsule 4   hydrochlorothiazide (HYDRODIURIL) 25 MG tablet Take 1 tablet (25 mg total) by mouth daily. (Patient taking differently: Take 25 mg by mouth daily. Every othe day) 45 tablet 4   hydroxychloroquine (PLAQUENIL) 200 MG tablet Take 2 tablets (400 mg total) by mouth daily with food or milk 180 tablet 3   insulin lispro (HUMALOG) 100 UNIT/ML injection Inject 6-15 Units into the skin 3 (three) times daily with meals. 6-15 units sliding scale AS NEEDED     levocetirizine (XYZAL) 5 MG tablet Take 1 tablet (5  mg total) by mouth every evening. 90 tablet 3   levothyroxine (SYNTHROID) 50 MCG tablet Take 1 tablet (50 mcg total) by mouth in the morning on an empty stomach for 90 days. 90 tablet 4   lidocaine (LIDODERM) 5 % Place 1 patch onto the skin once daily, remove after 12 (twelve) hours. 60 patch 3   metFORMIN (GLUCOPHAGE-XR) 500 MG 24 hr tablet Take 2 tablets (1,000 mg total) by mouth 2 (two) times daily. 360 tablet 4   methotrexate 50 MG/2ML injection Inject 0.8 mLs (20 mg total) into the skin once a week as directed 6 mL 1   montelukast (SINGULAIR) 10 MG tablet Take 1 tablet (10 mg total) by mouth every  evening. 90 tablet 3   NEOMYCIN-POLYMYXIN-HYDROCORTISONE (CORTISPORIN) 1 % SOLN OTIC solution Apply 1-2 drops to toe 2 times  a day after soaking (Patient taking differently: Apply 1-2 drops to toe 2 times  a day after soaking) 10 mL 1   Semaglutide, 2 MG/DOSE, (OZEMPIC, 2 MG/DOSE,) 8 MG/3ML SOPN Inject 2 mg into the skin once a week. 3 mL 6   tirzepatide (MOUNJARO) 12.5 MG/0.5ML Pen Inject 12.5 mg into the skin once a week. 6 mL 4   traMADol (ULTRAM) 50 MG tablet Take 1 tablet (50 mg total) by mouth 3 (three) times daily as needed. 270 tablet 0   TUBERCULIN SYR 1CC/27GX1/2" 27G X 1/2" 1 ML MISC Inject into the skin once a week with methotrexate 13 each 3   UNIFINE PENTIPS 31G X 5 MM MISC USE AS DIRECTED 5 TIMES PER DAY  4   valACYclovir (VALTREX) 1000 MG tablet Take 2 tablets by mouth twice a day for 1 day when needed 12 tablet 3   valsartan (DIOVAN) 320 MG tablet Take 1 tablet (320 mg total) by mouth daily. 90 tablet 0   valsartan (DIOVAN) 320 MG tablet Take 1 tablet (320 mg total) by mouth daily. 90 tablet 0   zolpidem (AMBIEN CR) 12.5 MG CR tablet Take 1 tablet (12.5 mg total) by mouth at bedtime as needed. 90 tablet 1   No current facility-administered medications on file prior to visit.    Allergies:  Allergies  Allergen Reactions   Dilaudid [Hydromorphone Hcl] Nausea And Vomiting    Hard to wake up   Bupropion     Other reaction(s): possibly   Doxycycline Nausea And Vomiting   Hydromorphone     Other reaction(s): Unknown   Topiramate Other (See Comments)    Suicidal thoughts per patient   Trazodone And Nefazodone Other (See Comments)    Hallucinations/ suicidal thoughts    Vital Signs:  BP (!) 160/85   Pulse 78   Ht '5\' 9"'$  (1.753 m)   Wt 205 lb (93 kg)   SpO2 98%   BMI 30.27 kg/m   Neurological Exam: MENTAL STATUS including orientation to time, place, person, recent and remote memory, attention span and concentration, language, and fund of knowledge is normal.  Speech  is not dysarthric.  CRANIAL NERVES:   Face is symmetric.  MOTOR: Needle insertion sides without hematoma or erythema.  Motor strength is 5/5 in all extremities, except left dorsiflexion 5-/5 and toe extension 4/5.  No atrophy, fasciculations or abnormal movements.  No pronator drift.  Tone is normal.    MSRs:  Right        Left brachioradialis 2+   2+  biceps 2+   2+  triceps 2+   2+  patellar 1+   1+  ankle jerk 2+   2+  plantar response down   down    SENSORY:  Hyperesthesia to pin prick and temperature over the dorsum of the left foot.  Vibration intact throughout. Romberg's sign absent.    COORDINATION/GAIT: Normal finger-to- nose-finger.  Intact rapid alternating movements bilaterally.    Gait narrow based and stable. Tandem and stressed gait intact.     Data: NCS/EMG of the legs 09/27/2022: Chronic left L5 radiculopathy, mild. There is no evidence of a sensorimotor polyneuropathy affecting the lower extremities.  MRI lumbar spine wo contrast 08/23/2022: 1. Left laminectomy L4-5 with resection of synovial cyst. No recurrent synovial cyst. Improvement in subarticular stenosis on the left. 2. Mild facet degeneration L5-S1 without neural impingement. 3. No cause for right leg radiculopathy identified.   MRI lumbar spine wo contrast 05/29/2021: L4-5 and L5-S1 facet osteoarthritis with mild anterolisthesis. At L4-5 there is a 5 mm left facet synovial cyst which compresses the left L5 nerve root at the subarticular recess.   Thank you for allowing me to participate in patient's care.  If I can answer any additional questions, I would be pleased to do so.    Sincerely,    Leanette Eutsler K. Posey Pronto, DO

## 2022-10-12 ENCOUNTER — Telehealth: Payer: Self-pay | Admitting: Podiatry

## 2022-10-12 NOTE — Telephone Encounter (Addendum)
DOS: 11/11/2022  [facility] Effective 11/14/2021  Neurectomy 4th Toe Lt (28080)  Deductible: $350 with $0 remaining Out-of-Pocket: $7,900 with $3,065.41 remaining CoInsurance: 20%  Prior authorization is not required per [facility] website.  Case ID: [id number] Trans ID: [id number] Authorization Valid: 11/11/2022 only

## 2022-10-17 ENCOUNTER — Telehealth: Payer: Self-pay

## 2022-10-17 NOTE — Telephone Encounter (Signed)
Amy Adams called to cancel her surgery with Dr. Milinda Pointer on 11/11/2022. She stated she needs to have back surgery first. Notified Dr. Milinda Pointer and Caren Griffins with [facility].

## 2022-10-18 ENCOUNTER — Other Ambulatory Visit (HOSPITAL_COMMUNITY): Payer: Self-pay

## 2022-10-19 ENCOUNTER — Other Ambulatory Visit (HOSPITAL_COMMUNITY): Payer: Self-pay

## 2022-10-24 DIAGNOSIS — M1991 Primary osteoarthritis, unspecified site: Secondary | ICD-10-CM | POA: Diagnosis not present

## 2022-10-24 DIAGNOSIS — M0589 Other rheumatoid arthritis with rheumatoid factor of multiple sites: Secondary | ICD-10-CM | POA: Diagnosis not present

## 2022-10-24 DIAGNOSIS — M79644 Pain in right finger(s): Secondary | ICD-10-CM | POA: Diagnosis not present

## 2022-10-24 DIAGNOSIS — N183 Chronic kidney disease, stage 3 unspecified: Secondary | ICD-10-CM | POA: Diagnosis not present

## 2022-10-24 DIAGNOSIS — Z79899 Other long term (current) drug therapy: Secondary | ICD-10-CM | POA: Diagnosis not present

## 2022-10-24 DIAGNOSIS — Z6830 Body mass index (BMI) 30.0-30.9, adult: Secondary | ICD-10-CM | POA: Diagnosis not present

## 2022-10-24 DIAGNOSIS — E669 Obesity, unspecified: Secondary | ICD-10-CM | POA: Diagnosis not present

## 2022-10-24 DIAGNOSIS — L401 Generalized pustular psoriasis: Secondary | ICD-10-CM | POA: Diagnosis not present

## 2022-10-24 DIAGNOSIS — M5136 Other intervertebral disc degeneration, lumbar region: Secondary | ICD-10-CM | POA: Diagnosis not present

## 2022-10-26 ENCOUNTER — Other Ambulatory Visit (HOSPITAL_COMMUNITY): Payer: Self-pay

## 2022-10-26 DIAGNOSIS — J4520 Mild intermittent asthma, uncomplicated: Secondary | ICD-10-CM | POA: Diagnosis not present

## 2022-10-26 DIAGNOSIS — J209 Acute bronchitis, unspecified: Secondary | ICD-10-CM | POA: Diagnosis not present

## 2022-10-26 MED ORDER — CEFUROXIME AXETIL 500 MG PO TABS
500.0000 mg | ORAL_TABLET | Freq: Two times a day (BID) | ORAL | 0 refills | Status: DC
  Filled 2022-10-26: qty 14, 7d supply, fill #0

## 2022-10-31 ENCOUNTER — Ambulatory Visit: Payer: 59 | Admitting: Neurology

## 2022-10-31 ENCOUNTER — Other Ambulatory Visit (HOSPITAL_COMMUNITY): Payer: Self-pay

## 2022-10-31 MED ORDER — AZITHROMYCIN 250 MG PO TABS
ORAL_TABLET | ORAL | 0 refills | Status: AC
  Filled 2022-10-31: qty 6, 5d supply, fill #0

## 2022-10-31 MED ORDER — FLUCONAZOLE 150 MG PO TABS
150.0000 mg | ORAL_TABLET | Freq: Every day | ORAL | 0 refills | Status: DC
  Filled 2022-10-31: qty 2, 2d supply, fill #0

## 2022-11-03 ENCOUNTER — Other Ambulatory Visit (HOSPITAL_COMMUNITY): Payer: Self-pay

## 2022-11-04 ENCOUNTER — Other Ambulatory Visit (HOSPITAL_COMMUNITY): Payer: Self-pay

## 2022-11-08 ENCOUNTER — Other Ambulatory Visit (HOSPITAL_COMMUNITY): Payer: Self-pay

## 2022-11-09 ENCOUNTER — Other Ambulatory Visit (HOSPITAL_COMMUNITY): Payer: Self-pay

## 2022-11-11 ENCOUNTER — Ambulatory Visit: Admit: 2022-11-11 | Payer: 59 | Admitting: Neurological Surgery

## 2022-11-11 ENCOUNTER — Other Ambulatory Visit (HOSPITAL_COMMUNITY): Payer: Self-pay

## 2022-11-11 DIAGNOSIS — M4316 Spondylolisthesis, lumbar region: Secondary | ICD-10-CM | POA: Diagnosis not present

## 2022-11-11 DIAGNOSIS — M7138 Other bursal cyst, other site: Secondary | ICD-10-CM | POA: Diagnosis not present

## 2022-11-11 DIAGNOSIS — M48061 Spinal stenosis, lumbar region without neurogenic claudication: Secondary | ICD-10-CM | POA: Diagnosis not present

## 2022-11-11 SURGERY — POSTERIOR LUMBAR FUSION 1 LEVEL
Anesthesia: General | Site: Back

## 2022-11-11 MED ORDER — CELECOXIB 200 MG PO CAPS
200.0000 mg | ORAL_CAPSULE | Freq: Two times a day (BID) | ORAL | 0 refills | Status: DC
  Filled 2022-11-11: qty 10, 5d supply, fill #0

## 2022-11-11 MED ORDER — OXYCODONE-ACETAMINOPHEN 10-325 MG PO TABS
1.0000 | ORAL_TABLET | Freq: Four times a day (QID) | ORAL | 0 refills | Status: DC | PRN
  Filled 2022-11-11: qty 55, 7d supply, fill #0

## 2022-11-11 MED ORDER — METHOCARBAMOL 500 MG PO TABS
500.0000 mg | ORAL_TABLET | Freq: Three times a day (TID) | ORAL | 1 refills | Status: DC | PRN
  Filled 2022-11-11: qty 60, 20d supply, fill #0
  Filled 2023-02-10: qty 60, 20d supply, fill #1

## 2022-11-17 ENCOUNTER — Encounter: Payer: 59 | Admitting: Podiatry

## 2022-11-22 ENCOUNTER — Other Ambulatory Visit (HOSPITAL_COMMUNITY): Payer: Self-pay

## 2022-11-22 MED ORDER — OXYCODONE-ACETAMINOPHEN 10-325 MG PO TABS
1.0000 | ORAL_TABLET | Freq: Four times a day (QID) | ORAL | 0 refills | Status: DC | PRN
  Filled 2022-11-22: qty 55, 7d supply, fill #0

## 2022-11-24 ENCOUNTER — Encounter: Payer: 59 | Admitting: Podiatry

## 2022-12-02 ENCOUNTER — Other Ambulatory Visit (HOSPITAL_COMMUNITY): Payer: Self-pay

## 2022-12-02 MED ORDER — INSULIN LISPRO (1 UNIT DIAL) 100 UNIT/ML (KWIKPEN)
10.0000 [IU] | PEN_INJECTOR | Freq: Three times a day (TID) | SUBCUTANEOUS | 3 refills | Status: DC
  Filled 2022-12-02: qty 30, 67d supply, fill #0
  Filled 2023-03-20 – 2023-04-19 (×2): qty 30, 67d supply, fill #1

## 2022-12-08 ENCOUNTER — Encounter: Payer: 59 | Admitting: Podiatry

## 2022-12-20 ENCOUNTER — Encounter: Payer: 59 | Admitting: Podiatry

## 2022-12-22 ENCOUNTER — Encounter: Payer: 59 | Admitting: Podiatry

## 2022-12-22 ENCOUNTER — Other Ambulatory Visit (HOSPITAL_COMMUNITY): Payer: Self-pay

## 2022-12-22 ENCOUNTER — Other Ambulatory Visit: Payer: Self-pay

## 2022-12-22 DIAGNOSIS — M810 Age-related osteoporosis without current pathological fracture: Secondary | ICD-10-CM | POA: Diagnosis not present

## 2022-12-22 DIAGNOSIS — Z6829 Body mass index (BMI) 29.0-29.9, adult: Secondary | ICD-10-CM | POA: Diagnosis not present

## 2022-12-22 DIAGNOSIS — M4316 Spondylolisthesis, lumbar region: Secondary | ICD-10-CM | POA: Diagnosis not present

## 2022-12-23 ENCOUNTER — Other Ambulatory Visit (HOSPITAL_COMMUNITY): Payer: Self-pay

## 2022-12-23 MED ORDER — VALSARTAN 320 MG PO TABS
320.0000 mg | ORAL_TABLET | Freq: Every day | ORAL | 0 refills | Status: DC
  Filled 2022-12-23: qty 90, 90d supply, fill #0

## 2022-12-26 ENCOUNTER — Other Ambulatory Visit: Payer: Self-pay

## 2022-12-26 ENCOUNTER — Other Ambulatory Visit (HOSPITAL_COMMUNITY): Payer: Self-pay

## 2022-12-26 MED ORDER — MONTELUKAST SODIUM 10 MG PO TABS
ORAL_TABLET | ORAL | 0 refills | Status: DC
  Filled 2022-12-26: qty 90, 90d supply, fill #0

## 2022-12-28 ENCOUNTER — Other Ambulatory Visit (HOSPITAL_COMMUNITY): Payer: Self-pay

## 2022-12-28 DIAGNOSIS — N1832 Chronic kidney disease, stage 3b: Secondary | ICD-10-CM | POA: Diagnosis not present

## 2022-12-28 DIAGNOSIS — E1122 Type 2 diabetes mellitus with diabetic chronic kidney disease: Secondary | ICD-10-CM | POA: Diagnosis not present

## 2022-12-28 DIAGNOSIS — I10 Essential (primary) hypertension: Secondary | ICD-10-CM | POA: Diagnosis not present

## 2022-12-28 DIAGNOSIS — M069 Rheumatoid arthritis, unspecified: Secondary | ICD-10-CM | POA: Diagnosis not present

## 2022-12-28 DIAGNOSIS — N182 Chronic kidney disease, stage 2 (mild): Secondary | ICD-10-CM | POA: Diagnosis not present

## 2022-12-28 MED ORDER — VALSARTAN 160 MG PO TABS
160.0000 mg | ORAL_TABLET | Freq: Every day | ORAL | 3 refills | Status: DC
  Filled 2022-12-28: qty 90, 90d supply, fill #0

## 2022-12-29 ENCOUNTER — Other Ambulatory Visit (HOSPITAL_COMMUNITY): Payer: Self-pay

## 2022-12-29 MED ORDER — MOUNJARO 15 MG/0.5ML ~~LOC~~ SOAJ
15.0000 mg | SUBCUTANEOUS | 4 refills | Status: DC
  Filled 2022-12-29 – 2023-03-20 (×3): qty 2, 28d supply, fill #0

## 2022-12-30 ENCOUNTER — Other Ambulatory Visit (HOSPITAL_COMMUNITY): Payer: Self-pay

## 2022-12-30 MED ORDER — OMRON 3 SERIES BP MONITOR DEVI
0 refills | Status: DC
  Filled 2022-12-30: qty 1, 90d supply, fill #0

## 2023-01-03 ENCOUNTER — Other Ambulatory Visit (HOSPITAL_COMMUNITY): Payer: Self-pay

## 2023-01-05 ENCOUNTER — Other Ambulatory Visit: Payer: Self-pay

## 2023-01-05 ENCOUNTER — Other Ambulatory Visit (HOSPITAL_COMMUNITY): Payer: Self-pay

## 2023-01-05 MED ORDER — MOUNJARO 7.5 MG/0.5ML ~~LOC~~ SOAJ
7.5000 mg | SUBCUTANEOUS | 1 refills | Status: DC
  Filled 2023-01-05: qty 2, 28d supply, fill #0

## 2023-01-06 ENCOUNTER — Other Ambulatory Visit (HOSPITAL_COMMUNITY): Payer: Self-pay

## 2023-01-11 ENCOUNTER — Other Ambulatory Visit (HOSPITAL_COMMUNITY): Payer: Self-pay

## 2023-01-11 DIAGNOSIS — M4316 Spondylolisthesis, lumbar region: Secondary | ICD-10-CM | POA: Diagnosis not present

## 2023-01-11 MED ORDER — METHOCARBAMOL 500 MG PO TABS
500.0000 mg | ORAL_TABLET | Freq: Three times a day (TID) | ORAL | 1 refills | Status: DC | PRN
  Filled 2023-01-11: qty 60, 20d supply, fill #0

## 2023-01-11 MED ORDER — METHYLPREDNISOLONE 4 MG PO TBPK
ORAL_TABLET | ORAL | 0 refills | Status: DC
  Filled 2023-01-11: qty 21, 6d supply, fill #0

## 2023-01-17 ENCOUNTER — Other Ambulatory Visit: Payer: Self-pay | Admitting: Neurological Surgery

## 2023-01-17 DIAGNOSIS — M5417 Radiculopathy, lumbosacral region: Secondary | ICD-10-CM

## 2023-01-18 ENCOUNTER — Other Ambulatory Visit (HOSPITAL_COMMUNITY): Payer: Self-pay

## 2023-01-18 DIAGNOSIS — M069 Rheumatoid arthritis, unspecified: Secondary | ICD-10-CM | POA: Diagnosis not present

## 2023-01-18 DIAGNOSIS — F419 Anxiety disorder, unspecified: Secondary | ICD-10-CM | POA: Diagnosis not present

## 2023-01-18 DIAGNOSIS — G8929 Other chronic pain: Secondary | ICD-10-CM | POA: Diagnosis not present

## 2023-01-18 DIAGNOSIS — J4520 Mild intermittent asthma, uncomplicated: Secondary | ICD-10-CM | POA: Diagnosis not present

## 2023-01-18 DIAGNOSIS — F324 Major depressive disorder, single episode, in partial remission: Secondary | ICD-10-CM | POA: Diagnosis not present

## 2023-01-18 DIAGNOSIS — K219 Gastro-esophageal reflux disease without esophagitis: Secondary | ICD-10-CM | POA: Diagnosis not present

## 2023-01-18 DIAGNOSIS — E782 Mixed hyperlipidemia: Secondary | ICD-10-CM | POA: Diagnosis not present

## 2023-01-18 DIAGNOSIS — E039 Hypothyroidism, unspecified: Secondary | ICD-10-CM | POA: Diagnosis not present

## 2023-01-18 DIAGNOSIS — I10 Essential (primary) hypertension: Secondary | ICD-10-CM | POA: Diagnosis not present

## 2023-01-18 DIAGNOSIS — E1142 Type 2 diabetes mellitus with diabetic polyneuropathy: Secondary | ICD-10-CM | POA: Diagnosis not present

## 2023-01-18 MED ORDER — ZOLPIDEM TARTRATE ER 12.5 MG PO TBCR
12.5000 mg | EXTENDED_RELEASE_TABLET | Freq: Every evening | ORAL | 1 refills | Status: AC | PRN
  Filled 2023-01-18 – 2023-04-13 (×3): qty 90, 90d supply, fill #0
  Filled 2023-06-09: qty 90, 90d supply, fill #1

## 2023-01-18 MED ORDER — ALPRAZOLAM 1 MG PO TABS
1.0000 mg | ORAL_TABLET | Freq: Three times a day (TID) | ORAL | 1 refills | Status: DC | PRN
  Filled 2023-03-20 – 2023-03-30 (×3): qty 270, 90d supply, fill #0
  Filled 2023-06-09 – 2023-06-26 (×3): qty 270, 90d supply, fill #1

## 2023-01-18 MED ORDER — VALSARTAN 320 MG PO TABS
320.0000 mg | ORAL_TABLET | Freq: Every day | ORAL | 3 refills | Status: DC
  Filled 2023-01-18 – 2023-03-20 (×2): qty 90, 90d supply, fill #0
  Filled 2023-06-12: qty 90, 90d supply, fill #1
  Filled 2023-09-19: qty 30, 30d supply, fill #2

## 2023-01-18 MED ORDER — GABAPENTIN 400 MG PO CAPS
400.0000 mg | ORAL_CAPSULE | Freq: Three times a day (TID) | ORAL | 3 refills | Status: DC
  Filled 2023-01-18 – 2023-06-09 (×2): qty 270, 90d supply, fill #0

## 2023-01-18 MED ORDER — DULOXETINE HCL 60 MG PO CPEP
60.0000 mg | ORAL_CAPSULE | Freq: Two times a day (BID) | ORAL | 3 refills | Status: DC
  Filled 2023-01-18 – 2023-06-09 (×3): qty 180, 90d supply, fill #0

## 2023-01-18 MED ORDER — LIDOCAINE 5 % EX PTCH
1.0000 | MEDICATED_PATCH | Freq: Every day | CUTANEOUS | 3 refills | Status: DC
  Filled 2023-01-18: qty 60, 60d supply, fill #0
  Filled 2023-03-20: qty 60, 60d supply, fill #1
  Filled 2023-05-08: qty 30, 30d supply, fill #1
  Filled 2023-06-12: qty 30, 30d supply, fill #2
  Filled 2023-11-04: qty 30, 30d supply, fill #3

## 2023-01-18 MED ORDER — LEVOCETIRIZINE DIHYDROCHLORIDE 5 MG PO TABS
5.0000 mg | ORAL_TABLET | Freq: Every evening | ORAL | 3 refills | Status: AC
  Filled 2023-01-18 – 2023-03-20 (×2): qty 90, 90d supply, fill #0
  Filled 2023-06-12: qty 90, 90d supply, fill #1

## 2023-01-18 MED ORDER — FENOFIBRATE MICRONIZED 134 MG PO CAPS
134.0000 mg | ORAL_CAPSULE | Freq: Every day | ORAL | 3 refills | Status: DC
  Filled 2023-01-18 – 2023-03-23 (×3): qty 90, 90d supply, fill #0
  Filled 2023-06-12: qty 90, 90d supply, fill #1

## 2023-01-18 MED ORDER — MONTELUKAST SODIUM 10 MG PO TABS
10.0000 mg | ORAL_TABLET | Freq: Every evening | ORAL | 3 refills | Status: DC
  Filled 2023-01-18 – 2023-03-23 (×2): qty 90, 90d supply, fill #0
  Filled 2023-06-12: qty 90, 90d supply, fill #1
  Filled 2023-10-16: qty 90, 90d supply, fill #2

## 2023-01-18 MED ORDER — CYCLOBENZAPRINE HCL 10 MG PO TABS
10.0000 mg | ORAL_TABLET | Freq: Three times a day (TID) | ORAL | 3 refills | Status: DC
  Filled 2023-01-18 – 2023-06-09 (×4): qty 180, 60d supply, fill #0
  Filled 2023-08-28: qty 90, 30d supply, fill #1
  Filled 2023-11-04: qty 90, 30d supply, fill #2

## 2023-01-18 MED ORDER — OMEPRAZOLE 40 MG PO CPDR
40.0000 mg | DELAYED_RELEASE_CAPSULE | Freq: Two times a day (BID) | ORAL | 3 refills | Status: AC
  Filled 2023-01-18 – 2023-05-15 (×2): qty 180, 90d supply, fill #0
  Filled 2023-09-22 – 2023-09-25 (×2): qty 60, 30d supply, fill #1
  Filled 2023-12-25: qty 180, 90d supply, fill #2
  Filled 2023-12-25: qty 60, 30d supply, fill #2

## 2023-01-23 DIAGNOSIS — M0589 Other rheumatoid arthritis with rheumatoid factor of multiple sites: Secondary | ICD-10-CM | POA: Diagnosis not present

## 2023-01-24 ENCOUNTER — Other Ambulatory Visit (HOSPITAL_COMMUNITY): Payer: Self-pay

## 2023-01-24 ENCOUNTER — Ambulatory Visit
Admission: RE | Admit: 2023-01-24 | Discharge: 2023-01-24 | Disposition: A | Discharge status: Home / Self Care | Payer: Commercial Managed Care - PPO | Source: Ambulatory Visit | Attending: Neurological Surgery | Admitting: Neurological Surgery

## 2023-01-24 DIAGNOSIS — M5417 Radiculopathy, lumbosacral region: Secondary | ICD-10-CM

## 2023-01-24 DIAGNOSIS — M549 Dorsalgia, unspecified: Secondary | ICD-10-CM | POA: Diagnosis not present

## 2023-01-24 MED ORDER — GADOPICLENOL 0.5 MMOL/ML IV SOLN
10.0000 mL | Freq: Once | INTRAVENOUS | Status: AC | PRN
  Administered 2023-01-24: 10 mL via INTRAVENOUS

## 2023-01-25 ENCOUNTER — Other Ambulatory Visit (HOSPITAL_COMMUNITY): Payer: Self-pay

## 2023-01-25 ENCOUNTER — Ambulatory Visit: Payer: Commercial Managed Care - PPO

## 2023-01-25 MED ORDER — OXYCODONE-ACETAMINOPHEN 10-325 MG PO TABS
1.0000 | ORAL_TABLET | Freq: Four times a day (QID) | ORAL | 0 refills | Status: DC | PRN
  Filled 2023-01-25: qty 55, 7d supply, fill #0

## 2023-01-26 DIAGNOSIS — M4316 Spondylolisthesis, lumbar region: Secondary | ICD-10-CM | POA: Diagnosis not present

## 2023-01-26 DIAGNOSIS — Z6829 Body mass index (BMI) 29.0-29.9, adult: Secondary | ICD-10-CM | POA: Diagnosis not present

## 2023-01-26 DIAGNOSIS — M5417 Radiculopathy, lumbosacral region: Secondary | ICD-10-CM | POA: Diagnosis not present

## 2023-01-30 ENCOUNTER — Other Ambulatory Visit (HOSPITAL_COMMUNITY): Payer: Self-pay

## 2023-02-01 DIAGNOSIS — M0589 Other rheumatoid arthritis with rheumatoid factor of multiple sites: Secondary | ICD-10-CM | POA: Diagnosis not present

## 2023-02-06 DIAGNOSIS — M5417 Radiculopathy, lumbosacral region: Secondary | ICD-10-CM | POA: Diagnosis not present

## 2023-02-08 ENCOUNTER — Other Ambulatory Visit: Payer: Commercial Managed Care - PPO

## 2023-02-10 ENCOUNTER — Other Ambulatory Visit (HOSPITAL_COMMUNITY): Payer: Self-pay

## 2023-02-10 DIAGNOSIS — H40033 Anatomical narrow angle, bilateral: Secondary | ICD-10-CM | POA: Diagnosis not present

## 2023-02-10 DIAGNOSIS — H40013 Open angle with borderline findings, low risk, bilateral: Secondary | ICD-10-CM | POA: Diagnosis not present

## 2023-02-10 DIAGNOSIS — H35033 Hypertensive retinopathy, bilateral: Secondary | ICD-10-CM | POA: Diagnosis not present

## 2023-02-10 DIAGNOSIS — H524 Presbyopia: Secondary | ICD-10-CM | POA: Diagnosis not present

## 2023-02-10 DIAGNOSIS — Z79899 Other long term (current) drug therapy: Secondary | ICD-10-CM | POA: Diagnosis not present

## 2023-02-11 ENCOUNTER — Other Ambulatory Visit (HOSPITAL_COMMUNITY): Payer: Self-pay

## 2023-02-11 MED ORDER — OXYCODONE-ACETAMINOPHEN 10-325 MG PO TABS
1.0000 | ORAL_TABLET | Freq: Four times a day (QID) | ORAL | 0 refills | Status: DC | PRN
  Filled 2023-02-11: qty 55, 7d supply, fill #0

## 2023-02-12 NOTE — Therapy (Unsigned)
OUTPATIENT PHYSICAL THERAPY THORACOLUMBAR EVALUATION   Patient Name: Amy Adams MRN: [id number] DOB:[birth year], 57 y.o., female Today's Date: 02/14/2023  END OF SESSION:  PT End of Session - 02/13/23 1507     Visit Number 1    Number of Visits 12    Date for PT Re-Evaluation 03/27/23    Authorization Type [place]    PT Start Time 1500    PT Stop Time [id number]    PT Time Calculation (min) 45 min    Activity Tolerance Patient limited by pain    Behavior During Therapy WFL for tasks assessed/performed             Past Medical History:  Diagnosis Date   Anxiety    Arthritis    Chronic foot pain, left    GERD (gastroesophageal reflux disease)    History of anal fissures    Hyperlipidemia    Hypertension    Hypothyroidism    Insulin dependent type 2 diabetes mellitus    endrocrinologist-- dr Chalmers Cater   Migraines    Peripheral neuropathy    RA (rheumatoid arthritis)    rheumatologist-  dr Amil Amen   SUI (stress urinary incontinence, female)    Wears glasses    Past Surgical History:  Procedure Laterality Date   ABDOMINAL HYSTERECTOMY  1990   with Right Salpingo--ophorectomy   ANAL SPHINCTEROTOMY  01-05-2006    dr Rise Patience @[facility]    BUNIONECTOMY  09-25-2009   dr Milinda Pointer @[facility]    right great toe   CARPAL TUNNEL RELEASE Bilateral 1996;  [place]  12/ 2014   dr Milinda Pointer   left 2nd hammertoe repair,  left 2nd metatarsal matrixectomy, left heel endoscopic plantar fasiotomy   IR CATHETER TUBE CHANGE  01/01/2019   IR RADIOLOGIST EVAL & MGMT  11/28/2018   IR RADIOLOGIST EVAL & MGMT  11/29/2018   IR RADIOLOGIST EVAL & MGMT  12/06/2018   IR RADIOLOGIST EVAL & MGMT  12/25/2018   IR RADIOLOGIST EVAL & MGMT  01/08/2019   IR RADIOLOGIST EVAL & MGMT  01/29/2019   PANNICULECTOMY N/A 10/26/2018   Procedure: PANNICULECTOMY;  Surgeon: Johnathan Hausen, MD;  Location: [facility];  Service: General;  Laterality: N/A;   TENDON REPAIR  09/2015   left  posterior tibial and peroneal tendon repairs   TOE AMPUTATION  01/2015   right 2nd toe   TONSILLECTOMY  age 70   TRANSOBTURATOR SLING  01-08-2004   dr Jeffie Pollock @[facility]    Patient Active Problem List   Diagnosis Date Noted   Degeneration of lumbar intervertebral disc 03/10/2022   Generalized psoriasis 03/10/2022   Other long term (current) drug therapy 03/10/2022   Overweight 03/10/2022   Pain in limb 03/10/2022   Primary osteoarthritis 03/10/2022   Synovial cyst 03/10/2022   S/P panniculectomy 10/26/2018   Panniculitis 09/04/2018   HYPOTHYROIDISM 12/01/2010   Type 2 diabetes mellitus with diabetic polyneuropathy, with long-term current use of insulin 12/01/2010   DIAB W/NEURO MANIFESTS TYPE II/UNS NOT UNCNTRL 12/01/2010   OTHER AND UNSPECIFIED HYPERLIPIDEMIA 12/01/2010   OBESITY, UNSPECIFIED 12/01/2010   ESSENTIAL HYPERTENSION, BENIGN 12/01/2010   Rheumatoid arthritis 12/01/2010    PCP: Shirline Frees MD   REFERRING PROVIDER:  Eustace Moore, MD  REFERRING DIAG: M43.16 (ICD-10-CM) - Spondylolisthesis, lumbar region  Rationale for Evaluation and Treatment: Rehabilitation  THERAPY DIAG:  Other low back pain  Radiculopathy, lumbar region  Muscle weakness (generalized)  ONSET DATE: chronic  SUBJECTIVE:                                                                                                                                                                                           SUBJECTIVE STATEMENT: Patient presents with chronic low back pain.  She states that before her surgery she suffered left foot drop.  EMG studies confirmed that the pain was coming from the L4 V nerve root.  Ultimately she underwent L4-5 fusion with Dr. Ronnald Ramp in December 2023.  Since then she continues to have significant lower back pain that now radiates into her right lower extremity.  She has been unable to go back to work.  She has not had physical therapy either before or after the surgery.   She can't walk long at all (< 2000 step per day) and is unable to do any amount of housework including sweeping, vacuuming.  She feels lower back pain and weakness when getting up from a squat/stooped position. Unable to sleep on back and has difficulty getting up from bed.   Legs both get numb and it feels like it is giving out.  When she stands it feels like her hip or going forward in her spine is going back is a bit will collapse.  Dr. Ronnald Ramp recommended trigger point dry needling therapy.    PERTINENT HISTORY:  See above  Lumbar L4-L5 fusion, laminectomy Removal of cyst   PAIN:  Are you having pain? Yes: NPRS scale: 7/10 Pain location: back (low) Rt thigh  Pain description: throbbing, aching, stiffess , burning  Aggravating factors: standing, activity  Relieving factors: oxycodone, R  side, pillows    PRECAUTIONS: Fall  WEIGHT BEARING RESTRICTIONS: No  FALLS:  Has patient fallen in last 6 months? Yes. Number of falls 2, back gave out   LIVING ENVIRONMENT: Lives with: lives with their family Lives in: House/apartment Stairs: Yes: External: 4 steps; on right going up Has following equipment at home: Walker - 2 wheeled  OCCUPATION: was working full time   PLOF: Independent Mom has to do the laundry, dishes and cooking.  PATIENT GOALS: I want to be able to do what I was before the surgery.    NEXT MD VISIT: Unknown  OBJECTIVE:   DIAGNOSTIC FINDINGS:  MRI lumbar spine wo contrast 08/23/2022: 1. Left laminectomy L4-5 with resection of synovial cyst. No recurrent synovial cyst. Improvement in subarticular stenosis on the left. 2. Mild facet degeneration L5-S1 without neural impingement. 3. No cause for right leg radiculopathy identified.   MRI lumbar spine wo contrast 05/29/2021: L4-5 and L5-S1 facet osteoarthritis with mild anterolisthesis. At L4-5 there is  a 5 mm left facet synovial cyst which compresses the left L5 nerve root at the subarticular  recess   .IMPRESSION: 1. Posterior and interbody fusion changes at L4-5. No obvious complicating features associated with the hardware. 2. Significant marrow edema involving the L4 and L5 vertebral bodies along with subsequent enhancement. This is likely a normal finding this soon after surgery with bony remodeling and stress related changes. I do not see any worrisome fluid collections or enhancement in the disc space to suggest infection. 3. Fluid in the laminectomy sites which is probably expected postoperative fluid collections. Would certainly correlate with clinical findings to exclude the possibility of infection but I think it is unlikely. 4. Possible small stress fracture involving the L4 vertebral body just above the inferior endplate. 5. The other intervertebral disc spaces are unremarkable.    PATIENT SURVEYS:  FOTO 30%  SCREENING FOR RED FLAGS: Bowel or bladder incontinence: No Spinal tumors: No Cauda equina syndrome: No Compression fracture: No Abdominal aneurysm: No  COGNITION: Overall cognitive status: Within functional limits for tasks assessed     SENSATION: Reports vague intermittent numbness tingling in lower extremities especially with standing and walking  MUSCLE LENGTH: Unable to test as patient would not lie supine POSTURE: rounded shoulders and decreased lumbar lordosis  PALPATION: Significant pain central L4-5 and into gluteals bilaterally  LUMBAR ROM:   AROM eval  Flexion 50% limited, pain and spasm  Extension 50% limited   Right lateral flexion Pain  Left lateral flexion Pain  Right rotation Limited 25% bilateral  Left rotation Limited 25% bilateral    (Blank rows = not tested)  LOWER EXTREMITY ROM:   NT  Passive  Right eval Left eval  Hip flexion    Hip extension    Hip abduction    Hip adduction    Hip internal rotation    Hip external rotation    Knee flexion    Knee extension    Ankle dorsiflexion    Ankle  plantarflexion    Ankle inversion    Ankle eversion     (Blank rows = not tested)  LOWER EXTREMITY MMT:    MMT Right eval Left eval  Hip flexion 3+ 3+  Hip extension    Hip abduction NT 3-/5  Hip adduction    Hip internal rotation    Hip external rotation    Knee flexion 4 4  Knee extension 4+ 4+  Ankle dorsiflexion 4+ 4+  Ankle plantarflexion    Ankle inversion    Ankle eversion     (Blank rows = not tested)  LUMBAR SPECIAL TESTS:  NT on eval  FUNCTIONAL TESTS:  5 times sit to stand: 41 sec no UE support  , usually uses hands on thighs   GAIT: Distance walked: 150 Assistive device utilized: None Level of assistance: Modified independence Comments: slower pace, antalgic   TODAY'S TREATMENT:  DATE: 02/13/23   Pt eval, HEP established and performed, demonstrated.  Recommended positioning for reduced low back pain, importance of strengthening to recover function.  Dry needling.  Importance of core for controlling symptomatic back pain, alternative location for PT   PATIENT EDUCATION:  Education details: see above  Person educated: Patient and Parent Education method: Explanation, Demonstration, Verbal cues, and Handouts Education comprehension: verbalized understanding and needs further education  HOME EXERCISE PROGRAM: Access Code: [email or web address] URL: [email or web address] Date: 02/13/2023 Prepared by: Raeford Razor  Exercises - Seated Cat Cow  - 2 x daily - 7 x weekly - 2 sets - 10 reps - 10 hold - Cat Cow to Child's Pose  - 2 x daily - 7 x weekly - 2 sets - 10 reps - 10-15 hold - Standing Transverse Abdominis Contraction  - 2 x daily - 7 x weekly - 2 sets - 10 reps - 10 hold  ASSESSMENT:  CLINICAL IMPRESSION: Patient is a 57y.o. female who was seen today for physical therapy evaluation and treatment for low back pain with  continued radicular symptoms into right lower extremity.  She reports a change in symptoms after her surgery, no longer having foot drop but with increased central pain.  She is very reluctant to do any exercises given the severity of her symptoms.  She was unable to lie in hook lying to fully assess hips and poor core strength he did find some relief standing against the wall performing core contraction. Works as a Teaching laboratory technician for [employer] would like to be able to return to work as well as get back to ADLs, IADLs  OBJECTIVE IMPAIRMENTS: Abnormal gait, decreased activity tolerance, decreased mobility, difficulty walking, decreased ROM, decreased strength, increased fascial restrictions, increased muscle spasms, impaired flexibility, impaired sensation, postural dysfunction, obesity, and pain.   ACTIVITY LIMITATIONS: carrying, lifting, bending, sitting, standing, squatting, sleeping, stairs, transfers, bed mobility, locomotion level, and caring for others  PARTICIPATION LIMITATIONS: meal prep, cleaning, laundry, medication management, interpersonal relationship, driving, shopping, community activity, and occupation  PERSONAL FACTORS: Time since onset of injury/illness/exacerbation and 3+ comorbidities: previous back surgery, diabetes, obesity   are also affecting patient's functional outcome.   REHAB POTENTIAL: Good  CLINICAL DECISION MAKING: Evolving/moderate complexity  EVALUATION COMPLEXITY: Moderate   GOALS: Goals reviewed with patient? No     LONG TERM GOALS: Target date: 03/27/2023    Pt will be able to show Independence with HEP for core and LE Baseline: none Goal status: INITIAL  2.  Pt will be able to perform 5 X STS in < 25 sec to demo improved functional mobility  Baseline: 41 sec  Goal status: INITIAL  3.  Pt will be able to perform light home tasks and ADLs with no more than min increased pain in back and legs.  Baseline: unable, mom has to do  Goal status:  INITIAL  4.  FOTO score will improve to 47% to demo improved functional mobility  Baseline: 30% Goal status: INITIAL  5.  Pt will be able to tolerate supine for short periods of time (20 min or more) in order to allow HEP and sleep  Baseline: unable , sleeps with multiple pillows  Goal status: INITIAL  6.  Further goals TBA by treating PT  Baseline:  Goal status: INITIAL  PLAN:  PT FREQUENCY: 2x/week  PT DURATION: 6 weeks  PLANNED INTERVENTIONS: Therapeutic exercises, Therapeutic activity, Neuromuscular re-education, Balance training, Gait training, Patient/Family education, Self Care, Joint mobilization, Dry Needling, Electrical stimulation,  Spinal mobilization, Cryotherapy, Moist heat, Manual therapy, and Re-evaluation.  PLAN FOR NEXT SESSION: dry needling per MD.  Needs HEP and core, hip strength.  Prefers to sit, stand or S/L due to back pain .  Modalities as needed.    Raymonda Pell, PT 02/14/2023, 7:40 AM

## 2023-02-13 ENCOUNTER — Ambulatory Visit: Payer: Commercial Managed Care - PPO | Attending: Neurology | Admitting: Physical Therapy

## 2023-02-13 ENCOUNTER — Other Ambulatory Visit: Payer: Self-pay

## 2023-02-13 ENCOUNTER — Encounter: Payer: Self-pay | Admitting: Physical Therapy

## 2023-02-13 ENCOUNTER — Other Ambulatory Visit (HOSPITAL_COMMUNITY): Payer: Self-pay

## 2023-02-13 DIAGNOSIS — M6281 Muscle weakness (generalized): Secondary | ICD-10-CM | POA: Diagnosis not present

## 2023-02-13 DIAGNOSIS — M5459 Other low back pain: Secondary | ICD-10-CM | POA: Diagnosis not present

## 2023-02-13 DIAGNOSIS — M5416 Radiculopathy, lumbar region: Secondary | ICD-10-CM | POA: Diagnosis not present

## 2023-02-14 ENCOUNTER — Other Ambulatory Visit (HOSPITAL_COMMUNITY): Payer: Self-pay

## 2023-02-15 ENCOUNTER — Other Ambulatory Visit (HOSPITAL_COMMUNITY): Payer: Self-pay

## 2023-02-15 ENCOUNTER — Other Ambulatory Visit: Payer: Self-pay

## 2023-02-15 ENCOUNTER — Ambulatory Visit: Payer: Commercial Managed Care - PPO

## 2023-02-15 DIAGNOSIS — M6281 Muscle weakness (generalized): Secondary | ICD-10-CM

## 2023-02-15 DIAGNOSIS — M5416 Radiculopathy, lumbar region: Secondary | ICD-10-CM | POA: Diagnosis not present

## 2023-02-15 DIAGNOSIS — M5459 Other low back pain: Secondary | ICD-10-CM

## 2023-02-15 MED ORDER — OZEMPIC (2 MG/DOSE) 8 MG/3ML ~~LOC~~ SOPN
PEN_INJECTOR | SUBCUTANEOUS | 5 refills | Status: DC
  Filled 2023-02-15: qty 3, 28d supply, fill #0
  Filled 2023-03-20: qty 3, fill #0

## 2023-02-15 NOTE — Therapy (Signed)
OUTPATIENT PHYSICAL THERAPY THORACOLUMBAR TREATMENT   Patient Name: Amy Adams MRN: [id number] DOB:08-22-1966, 57 y.o., female Today's Date: 02/15/2023  END OF SESSION:  PT End of Session - 02/15/23 1351     Visit Number 2    Number of Visits 12    Date for PT Re-Evaluation 03/27/23    Authorization Type [place]    PT Start Time [id number]    PT Stop Time [id number]    PT Time Calculation (min) 60 min    Activity Tolerance Patient limited by pain    Behavior During Therapy WFL for tasks assessed/performed             Past Medical History:  Diagnosis Date   Anxiety    Arthritis    Chronic foot pain, left    GERD (gastroesophageal reflux disease)    History of anal fissures    Hyperlipidemia    Hypertension    Hypothyroidism    Insulin dependent type 2 diabetes mellitus    endrocrinologist-- dr Chalmers Cater   Migraines    Peripheral neuropathy    RA (rheumatoid arthritis)    rheumatologist-  dr Amil Amen   SUI (stress urinary incontinence, female)    Wears glasses    Past Surgical History:  Procedure Laterality Date   ABDOMINAL HYSTERECTOMY  1990   with Right Salpingo--ophorectomy   ANAL SPHINCTEROTOMY  01-05-2006    dr Rise Patience @[facility]    BUNIONECTOMY  09-25-2009   dr Milinda Pointer @[facility]    right great toe   CARPAL TUNNEL RELEASE Bilateral 1996;  [place]  12/ 2014   dr Milinda Pointer   left 2nd hammertoe repair,  left 2nd metatarsal matrixectomy, left heel endoscopic plantar fasiotomy   IR CATHETER TUBE CHANGE  01/01/2019   IR RADIOLOGIST EVAL & MGMT  11/28/2018   IR RADIOLOGIST EVAL & MGMT  11/29/2018   IR RADIOLOGIST EVAL & MGMT  12/06/2018   IR RADIOLOGIST EVAL & MGMT  12/25/2018   IR RADIOLOGIST EVAL & MGMT  01/08/2019   IR RADIOLOGIST EVAL & MGMT  01/29/2019   PANNICULECTOMY N/A 10/26/2018   Procedure: PANNICULECTOMY;  Surgeon: Johnathan Hausen, MD;  Location: [facility];  Service: General;  Laterality: N/A;   TENDON REPAIR  09/2015   left posterior  tibial and peroneal tendon repairs   TOE AMPUTATION  01/2015   right 2nd toe   TONSILLECTOMY  age 65   TRANSOBTURATOR SLING  01-08-2004   dr Jeffie Pollock @[facility]    Patient Active Problem List   Diagnosis Date Noted   Degeneration of lumbar intervertebral disc 03/10/2022   Generalized psoriasis 03/10/2022   Other long term (current) drug therapy 03/10/2022   Overweight 03/10/2022   Pain in limb 03/10/2022   Primary osteoarthritis 03/10/2022   Synovial cyst 03/10/2022   S/P panniculectomy 10/26/2018   Panniculitis 09/04/2018   HYPOTHYROIDISM 12/01/2010   Type 2 diabetes mellitus with diabetic polyneuropathy, with long-term current use of insulin 12/01/2010   DIAB W/NEURO MANIFESTS TYPE II/UNS NOT UNCNTRL 12/01/2010   OTHER AND UNSPECIFIED HYPERLIPIDEMIA 12/01/2010   OBESITY, UNSPECIFIED 12/01/2010   ESSENTIAL HYPERTENSION, BENIGN 12/01/2010   Rheumatoid arthritis 12/01/2010    PCP: Shirline Frees MD   REFERRING PROVIDER:  Eustace Moore, MD  REFERRING DIAG: M43.16 (ICD-10-CM) - Spondylolisthesis, lumbar region  Rationale for Evaluation and Treatment: Rehabilitation  THERAPY DIAG:  Other low back pain  Radiculopathy, lumbar region  Muscle weakness (generalized)  ONSET DATE: chronic  SUBJECTIVE:                                                                                                                                                                                           SUBJECTIVE STATEMENT: Pt report 5/10 low back pain today.   PERTINENT HISTORY:  See above  Lumbar L4-L5 fusion, laminectomy Removal of cyst   PAIN:  Are you having pain? Yes: NPRS scale: 5/10 Pain location: back (low) Rt thigh  Pain description: throbbing, aching, stiffess , burning  Aggravating factors: standing, activity  Relieving factors: oxycodone, R  side, pillows    PRECAUTIONS: Fall  WEIGHT BEARING RESTRICTIONS: No  FALLS:  Has patient fallen in last 6 months? Yes. Number of falls  2, back gave out   LIVING ENVIRONMENT: Lives with: lives with their family Lives in: House/apartment Stairs: Yes: External: 4 steps; on right going up Has following equipment at home: Walker - 2 wheeled  OCCUPATION: was working full time   PLOF: Independent Mom has to do the laundry, dishes and cooking.  PATIENT GOALS: I want to be able to do what I was before the surgery.    NEXT MD VISIT: Unknown  OBJECTIVE:   DIAGNOSTIC FINDINGS:  MRI lumbar spine wo contrast 08/23/2022: 1. Left laminectomy L4-5 with resection of synovial cyst. No recurrent synovial cyst. Improvement in subarticular stenosis on the left. 2. Mild facet degeneration L5-S1 without neural impingement. 3. No cause for right leg radiculopathy identified.   MRI lumbar spine wo contrast 05/29/2021: L4-5 and L5-S1 facet osteoarthritis with mild anterolisthesis. At L4-5 there is a 5 mm left facet synovial cyst which compresses the left L5 nerve root at the subarticular recess   .IMPRESSION: 1. Posterior and interbody fusion changes at L4-5. No obvious complicating features associated with the hardware. 2. Significant marrow edema involving the L4 and L5 vertebral bodies along with subsequent enhancement. This is likely a normal finding this soon after surgery with bony remodeling and stress related changes. I do not see any worrisome fluid collections or enhancement in the disc space to suggest infection. 3. Fluid in the laminectomy sites which is probably expected postoperative fluid collections. Would certainly correlate with clinical findings to exclude the possibility of infection but I think it is unlikely. 4. Possible small stress fracture involving the L4 vertebral body just above the inferior endplate. 5. The other intervertebral disc spaces are unremarkable.    PATIENT SURVEYS:  FOTO 30%  SCREENING FOR RED FLAGS: Bowel or bladder incontinence: No Spinal tumors: No Cauda equina syndrome:  No Compression fracture: No Abdominal aneurysm: No  COGNITION: Overall cognitive status: Within functional  limits for tasks assessed     SENSATION: Reports vague intermittent numbness tingling in lower extremities especially with standing and walking  MUSCLE LENGTH: Unable to test as patient would not lie supine POSTURE: rounded shoulders and decreased lumbar lordosis  PALPATION: Significant pain central L4-5 and into gluteals bilaterally  LUMBAR ROM:   AROM eval  Flexion 50% limited, pain and spasm  Extension 50% limited   Right lateral flexion Pain  Left lateral flexion Pain  Right rotation Limited 25% bilateral  Left rotation Limited 25% bilateral    (Blank rows = not tested)  LOWER EXTREMITY ROM:   NT  Passive  Right eval Left eval  Hip flexion    Hip extension    Hip abduction    Hip adduction    Hip internal rotation    Hip external rotation    Knee flexion    Knee extension    Ankle dorsiflexion    Ankle plantarflexion    Ankle inversion    Ankle eversion     (Blank rows = not tested)  LOWER EXTREMITY MMT:    MMT Right eval Left eval  Hip flexion 3+ 3+  Hip extension    Hip abduction NT 3-/5  Hip adduction    Hip internal rotation    Hip external rotation    Knee flexion 4 4  Knee extension 4+ 4+  Ankle dorsiflexion 4+ 4+  Ankle plantarflexion    Ankle inversion    Ankle eversion     (Blank rows = not tested)  LUMBAR SPECIAL TESTS:  NT on eval  FUNCTIONAL TESTS:  5 times sit to stand: 41 sec no UE support  , usually uses hands on thighs   GAIT: Distance walked: 150 Assistive device utilized: None Level of assistance: Modified independence Comments: slower pace, antalgic   TODAY'S TREATMENT:                                                                                                                              DATE: 02/13/23  Manual Therapy Soft Tissue Mobilization: lumbar, STW/M to bil lumbar paraspinals with pt positioned in  sitting leaning onto elevated treatment table to decrease pain and tone    Modalities  Date:  Unattended Estim: Lumbar, IFC 80-150 Hz, 15 mins, Pain Hot Pack: Lumbar, 15 mins, Pain and Tone   PATIENT EDUCATION:  Education details: see above  Person educated: Patient and Parent Education method: Explanation, Demonstration, Verbal cues, and Handouts Education comprehension: verbalized understanding and needs further education  HOME EXERCISE PROGRAM: Access Code: [email or web address] URL: [email or web address] Date: 02/13/2023 Prepared by: Raeford Razor  Exercises - Seated Cat Cow  - 2 x daily - 7 x weekly - 2 sets - 10 reps - 10 hold - Cat Cow to Child's Pose  - 2 x daily - 7 x weekly - 2 sets - 10 reps - 10-15 hold - Standing Transverse Abdominis Contraction  - 2 x daily -  7 x weekly - 2 sets - 10 reps - 10 hold  ASSESSMENT:  CLINICAL IMPRESSION: Pt arrives for today's treatment session reporting 5/10 low back pain.  Pt educated on contraindications of dry needling and requires a few more weeks post op before dry needling can be performed.  STW/M performed to bil lumbar paraspinals to decrease pain and tone.  Normal responses to estim and MH noted upon removal.  Pt reported decrease in pain at completion of today's treatment session.  OBJECTIVE IMPAIRMENTS: Abnormal gait, decreased activity tolerance, decreased mobility, difficulty walking, decreased ROM, decreased strength, increased fascial restrictions, increased muscle spasms, impaired flexibility, impaired sensation, postural dysfunction, obesity, and pain.   ACTIVITY LIMITATIONS: carrying, lifting, bending, sitting, standing, squatting, sleeping, stairs, transfers, bed mobility, locomotion level, and caring for others  PARTICIPATION LIMITATIONS: meal prep, cleaning, laundry, medication management, interpersonal relationship, driving, shopping, community activity, and occupation  PERSONAL FACTORS: Time since onset of  injury/illness/exacerbation and 3+ comorbidities: previous back surgery, diabetes, obesity   are also affecting patient's functional outcome.   REHAB POTENTIAL: Good  CLINICAL DECISION MAKING: Evolving/moderate complexity  EVALUATION COMPLEXITY: Moderate   GOALS: Goals reviewed with patient? No     LONG TERM GOALS: Target date: 03/27/2023    Pt will be able to show Independence with HEP for core and LE Baseline: none Goal status: INITIAL  2.  Pt will be able to perform 5 X STS in < 25 sec to demo improved functional mobility  Baseline: 41 sec  Goal status: INITIAL  3.  Pt will be able to perform light home tasks and ADLs with no more than min increased pain in back and legs.  Baseline: unable, mom has to do  Goal status: INITIAL  4.  FOTO score will improve to 47% to demo improved functional mobility  Baseline: 30% Goal status: INITIAL  5.  Pt will be able to tolerate supine for short periods of time (20 min or more) in order to allow HEP and sleep  Baseline: unable , sleeps with multiple pillows  Goal status: INITIAL  6.  Further goals TBA by treating PT  Baseline:  Goal status: INITIAL  PLAN:  PT FREQUENCY: 2x/week  PT DURATION: 6 weeks  PLANNED INTERVENTIONS: Therapeutic exercises, Therapeutic activity, Neuromuscular re-education, Balance training, Gait training, Patient/Family education, Self Care, Joint mobilization, Dry Needling, Electrical stimulation, Spinal mobilization, Cryotherapy, Moist heat, Manual therapy, and Re-evaluation.  PLAN FOR NEXT SESSION: dry needling per MD.  Needs HEP and core, hip strength.  Prefers to sit, stand or S/L due to back pain .  Modalities as needed.    Kathrynn Ducking, PTA 02/15/2023, 2:56 PM

## 2023-02-17 ENCOUNTER — Other Ambulatory Visit (HOSPITAL_COMMUNITY): Payer: Self-pay

## 2023-02-20 ENCOUNTER — Ambulatory Visit: Payer: Commercial Managed Care - PPO

## 2023-02-21 ENCOUNTER — Other Ambulatory Visit (HOSPITAL_COMMUNITY): Payer: Self-pay

## 2023-02-21 ENCOUNTER — Other Ambulatory Visit (HOSPITAL_COMMUNITY): Payer: Self-pay | Admitting: Student

## 2023-02-21 ENCOUNTER — Other Ambulatory Visit: Payer: Self-pay

## 2023-02-21 ENCOUNTER — Encounter: Payer: Commercial Managed Care - PPO | Attending: Registered Nurse | Admitting: Registered Nurse

## 2023-02-21 DIAGNOSIS — E1165 Type 2 diabetes mellitus with hyperglycemia: Secondary | ICD-10-CM | POA: Diagnosis not present

## 2023-02-21 DIAGNOSIS — M545 Low back pain, unspecified: Secondary | ICD-10-CM | POA: Diagnosis not present

## 2023-02-21 DIAGNOSIS — E049 Nontoxic goiter, unspecified: Secondary | ICD-10-CM | POA: Diagnosis not present

## 2023-02-21 DIAGNOSIS — Z5181 Encounter for therapeutic drug level monitoring: Secondary | ICD-10-CM | POA: Diagnosis not present

## 2023-02-21 DIAGNOSIS — M4316 Spondylolisthesis, lumbar region: Secondary | ICD-10-CM

## 2023-02-21 DIAGNOSIS — G8929 Other chronic pain: Secondary | ICD-10-CM | POA: Insufficient documentation

## 2023-02-21 DIAGNOSIS — G609 Hereditary and idiopathic neuropathy, unspecified: Secondary | ICD-10-CM | POA: Diagnosis not present

## 2023-02-21 DIAGNOSIS — G894 Chronic pain syndrome: Secondary | ICD-10-CM | POA: Insufficient documentation

## 2023-02-21 DIAGNOSIS — E7800 Pure hypercholesterolemia, unspecified: Secondary | ICD-10-CM | POA: Diagnosis not present

## 2023-02-21 DIAGNOSIS — Z79899 Other long term (current) drug therapy: Secondary | ICD-10-CM | POA: Diagnosis not present

## 2023-02-21 DIAGNOSIS — E039 Hypothyroidism, unspecified: Secondary | ICD-10-CM | POA: Diagnosis not present

## 2023-02-21 DIAGNOSIS — Z6828 Body mass index (BMI) 28.0-28.9, adult: Secondary | ICD-10-CM | POA: Diagnosis not present

## 2023-02-21 MED ORDER — BUPRENORPHINE 5 MCG/HR TD PTWK
1.0000 | MEDICATED_PATCH | TRANSDERMAL | 0 refills | Status: DC
  Filled 2023-02-21: qty 4, 28d supply, fill #0

## 2023-02-21 MED ORDER — RYBELSUS 3 MG PO TABS
3.0000 mg | ORAL_TABLET | Freq: Every day | ORAL | 0 refills | Status: DC
  Filled 2023-02-21 (×3): qty 30, 30d supply, fill #0

## 2023-02-21 NOTE — Progress Notes (Unsigned)
Subjective:    Patient ID: Amy Adams, female    DOB: 04/16/1966, 57 y.o.   MRN: [id number]  HPI: Amy Adams is a 57 y.o. female who returns for follow up appointment for chronic pain and medication refill. states *** pain is located in  ***. rates pain ***. current exercise regime is walking and performing stretching exercises.  [name] Morphine equivalent is *** MME.       Pain Inventory Average Pain 7 Pain Right Now 7 My pain is constant, sharp, burning, and stabbing  In the last 24 hours, has pain interfered with the following? General activity 9 Relation with others 5 Enjoyment of life 9 What TIME of day is your pain at its worst? morning  Sleep (in general) Poor  Pain is worse with: walking, bending, sitting, and standing Pain improves with: medication and nothing helping at this point Relief from Meds: 3  Family History  Problem Relation Age of Onset   Diabetes Mother    Migraines Mother    Arrhythmia Father    COPD Father    Cancer Father    Diabetes Sister    Social History   Socioeconomic History   Marital status: Widowed    Spouse name: Not on file   Number of children: 2   Years of education: Not on file   Highest education level: Not on file  Occupational History   Not on file  Tobacco Use   Smoking status: Never   Smokeless tobacco: Never  Vaping Use   Vaping Use: Never used  Substance and Sexual Activity   Alcohol use: No   Drug use: Never   Sexual activity: Not on file  Other Topics Concern   Not on file  Social History Narrative   [employer]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   Are you right handed or left handed? Right   Are you currently employed ? [employer]   What is your current occupation?   Do you live at home alone? yes   Caffeine 2 weekly soda   What type of home do you live in: 1 story or 2 story? one       Social Determinants of Health   Financial Resource Strain: Not on file  Food Insecurity: Not on file  Transportation  Needs: Not on file  Physical Activity: Not on file  Stress: Not on file  Social Connections: Not on file   Past Surgical History:  Procedure Laterality Date   ABDOMINAL HYSTERECTOMY  1990   with Right Salpingo--ophorectomy   ANAL SPHINCTEROTOMY  01-05-2006    dr Zachery Dakinsweatherly @[facility]    BUNIONECTOMY  09-25-2009   dr Al Corpushyatt @[facility]    right great toe   CARPAL TUNNEL RELEASE Bilateral 1996;  1997   CESAREAN SECTION  1988   FOOT SURGERY  12/ 2014   dr Al Corpushyatt   left 2nd hammertoe repair,  left 2nd metatarsal matrixectomy, left heel endoscopic plantar fasiotomy   IR CATHETER TUBE CHANGE  01/01/2019   IR RADIOLOGIST EVAL & MGMT  11/28/2018   IR RADIOLOGIST EVAL & MGMT  11/29/2018   IR RADIOLOGIST EVAL & MGMT  12/06/2018   IR RADIOLOGIST EVAL & MGMT  12/25/2018   IR RADIOLOGIST EVAL & MGMT  01/08/2019   IR RADIOLOGIST EVAL & MGMT  01/29/2019   PANNICULECTOMY N/A 10/26/2018   Procedure: PANNICULECTOMY;  Surgeon: Luretha MurphyMartin, Matthew, MD;  Location: [facility];  Service: General;  Laterality: N/A;   TENDON REPAIR  09/2015   left  posterior tibial and peroneal tendon repairs   TOE AMPUTATION  01/2015   right 2nd toe   TONSILLECTOMY  age 54   TRANSOBTURATOR SLING  01-08-2004   dr Annabell Howells @[facility]    Past Surgical History:  Procedure Laterality Date   ABDOMINAL HYSTERECTOMY  1990   with Right Salpingo--ophorectomy   ANAL SPHINCTEROTOMY  01-05-2006    dr Zachery Dakins @[facility]    BUNIONECTOMY  09-25-2009   dr Al Corpus @[facility]    right great toe   CARPAL TUNNEL RELEASE Bilateral 1996;  1997   CESAREAN SECTION  1988   FOOT SURGERY  12/ 2014   dr Al Corpus   left 2nd hammertoe repair,  left 2nd metatarsal matrixectomy, left heel endoscopic plantar fasiotomy   IR CATHETER TUBE CHANGE  01/01/2019   IR RADIOLOGIST EVAL & MGMT  11/28/2018   IR RADIOLOGIST EVAL & MGMT  11/29/2018   IR RADIOLOGIST EVAL & MGMT  12/06/2018   IR RADIOLOGIST EVAL & MGMT  12/25/2018   IR RADIOLOGIST EVAL & MGMT  01/08/2019   IR RADIOLOGIST EVAL & MGMT  01/29/2019    PANNICULECTOMY N/A 10/26/2018   Procedure: PANNICULECTOMY;  Surgeon: Luretha Murphy, MD;  Location: [facility];  Service: General;  Laterality: N/A;   TENDON REPAIR  09/2015   left posterior tibial and peroneal tendon repairs   TOE AMPUTATION  01/2015   right 2nd toe   TONSILLECTOMY  age 69   TRANSOBTURATOR SLING  01-08-2004   dr Annabell Howells @[facility]    Past Medical History:  Diagnosis Date   Anxiety    Arthritis    Chronic foot pain, left    GERD (gastroesophageal reflux disease)    History of anal fissures    Hyperlipidemia    Hypertension    Hypothyroidism    Insulin dependent type 2 diabetes mellitus    endrocrinologist-- dr Talmage Nap   Migraines    Peripheral neuropathy    RA (rheumatoid arthritis)    rheumatologist-  dr Dierdre Forth   SUI (stress urinary incontinence, female)    Wears glasses    There were no vitals taken for this visit.  Opioid Risk Score:   Fall Risk Score:  `1  Depression screen PHQ 2/9     02/21/2023    2:16 PM 03/11/2022    1:04 PM 09/02/2021    9:14 AM 08/12/2021    2:31 PM 12/23/2019    8:13 AM 04/03/2019    9:01 AM 01/11/2018    5:20 PM  Depression screen PHQ 2/9  Decreased Interest 3 0 0 0 0 1 1  Down, Depressed, Hopeless 3 0 0 1 0 0 2  PHQ - 2 Score 6 0 0 1 0 1 3  Altered sleeping    3     Tired, decreased energy    1     Change in appetite    3     Feeling bad or failure about yourself     0     Trouble concentrating    0     Moving slowly or fidgety/restless    0     Suicidal thoughts    0     PHQ-9 Score    8     Difficult doing work/chores    Not difficult at all        Review of Systems  Constitutional: Negative.   HENT: Negative.    Eyes: Negative.   Respiratory: Negative.    Cardiovascular: Negative.   Gastrointestinal: Negative.   Endocrine: Negative.  Genitourinary: Negative.   Musculoskeletal:  Positive for back pain.  Skin: Negative.   Allergic/Immunologic: Negative.   Neurological: Negative.   Hematological: Negative.    Psychiatric/Behavioral:  Positive for dysphoric mood.   All other systems reviewed and are negative.      Objective:   Physical Exam        Assessment & Plan:

## 2023-02-22 ENCOUNTER — Other Ambulatory Visit (HOSPITAL_COMMUNITY): Payer: Self-pay

## 2023-02-22 ENCOUNTER — Encounter: Payer: Self-pay | Admitting: Registered Nurse

## 2023-02-22 ENCOUNTER — Ambulatory Visit: Payer: Commercial Managed Care - PPO

## 2023-02-22 ENCOUNTER — Other Ambulatory Visit: Payer: Self-pay

## 2023-02-22 DIAGNOSIS — M6281 Muscle weakness (generalized): Secondary | ICD-10-CM

## 2023-02-22 DIAGNOSIS — M5416 Radiculopathy, lumbar region: Secondary | ICD-10-CM

## 2023-02-22 DIAGNOSIS — M5459 Other low back pain: Secondary | ICD-10-CM | POA: Diagnosis not present

## 2023-02-22 NOTE — Therapy (Signed)
OUTPATIENT PHYSICAL THERAPY THORACOLUMBAR TREATMENT   Patient Name: Amy Adams MRN: [id number] DOB:06/23/1966, 57 y.o., female Today's Date: 02/22/2023  END OF SESSION:  PT End of Session - 02/22/23 1425     Visit Number 3    Number of Visits 12    Date for PT Re-Evaluation 03/27/23    Authorization Type [employer]    PT Start Time 1345    PT Stop Time 1443    PT Time Calculation (min) 58 min    Activity Tolerance Patient limited by pain    Behavior During Therapy WFL for tasks assessed/performed             Past Medical History:  Diagnosis Date   Anxiety    Arthritis    Chronic foot pain, left    GERD (gastroesophageal reflux disease)    History of anal fissures    Hyperlipidemia    Hypertension    Hypothyroidism    Insulin dependent type 2 diabetes mellitus    endrocrinologist-- dr Talmage Napbalan   Migraines    Peripheral neuropathy    RA (rheumatoid arthritis)    rheumatologist-  dr Dierdre Forthbeekman   SUI (stress urinary incontinence, female)    Wears glasses    Past Surgical History:  Procedure Laterality Date   ABDOMINAL HYSTERECTOMY  1990   with Right Salpingo--ophorectomy   ANAL SPHINCTEROTOMY  01-05-2006    dr Zachery Dakinsweatherly @[facility]    BUNIONECTOMY  09-25-2009   dr Al Corpushyatt @[facility]    right great toe   CARPAL TUNNEL RELEASE Bilateral 1996;  1997   CESAREAN SECTION  1988   FOOT SURGERY  12/ 2014   dr Al Corpushyatt   left 2nd hammertoe repair,  left 2nd metatarsal matrixectomy, left heel endoscopic plantar fasiotomy   IR CATHETER TUBE CHANGE  01/01/2019   IR RADIOLOGIST EVAL & MGMT  11/28/2018   IR RADIOLOGIST EVAL & MGMT  11/29/2018   IR RADIOLOGIST EVAL & MGMT  12/06/2018   IR RADIOLOGIST EVAL & MGMT  12/25/2018   IR RADIOLOGIST EVAL & MGMT  01/08/2019   IR RADIOLOGIST EVAL & MGMT  01/29/2019   PANNICULECTOMY N/A 10/26/2018   Procedure: PANNICULECTOMY;  Surgeon: Luretha MurphyMartin, Matthew, MD;  Location: [facility];  Service: General;  Laterality: N/A;   TENDON REPAIR  09/2015   left  posterior tibial and peroneal tendon repairs   TOE AMPUTATION  01/2015   right 2nd toe   TONSILLECTOMY  age 57   TRANSOBTURATOR SLING  01-08-2004   dr Annabell Howellswrenn @[facility]    Patient Active Problem List   Diagnosis Date Noted   Degeneration of lumbar intervertebral disc 03/10/2022   Generalized psoriasis 03/10/2022   Other long term (current) drug therapy 03/10/2022   Overweight 03/10/2022   Pain in limb 03/10/2022   Primary osteoarthritis 03/10/2022   Synovial cyst 03/10/2022   S/P panniculectomy 10/26/2018   Panniculitis 09/04/2018   HYPOTHYROIDISM 12/01/2010   Type 2 diabetes mellitus with diabetic polyneuropathy, with long-term current use of insulin 12/01/2010   DIAB W/NEURO MANIFESTS TYPE II/UNS NOT UNCNTRL 12/01/2010   OTHER AND UNSPECIFIED HYPERLIPIDEMIA 12/01/2010   OBESITY, UNSPECIFIED 12/01/2010   ESSENTIAL HYPERTENSION, BENIGN 12/01/2010   Rheumatoid arthritis 12/01/2010    PCP: Johny BlamerHarris, William MD   REFERRING PROVIDER:  Tia AlertJones, David S, MD  REFERRING DIAG: M43.16 (ICD-10-CM) - Spondylolisthesis, lumbar region  Rationale for Evaluation and Treatment: Rehabilitation  THERAPY DIAG:  Other low back pain  Radiculopathy, lumbar region  Muscle weakness (generalized)  ONSET DATE: chronic  SUBJECTIVE:                                                                                                                                                                                           SUBJECTIVE STATEMENT: Pt report 8/10 low back pain today.   PERTINENT HISTORY:  See above  Lumbar L4-L5 fusion, laminectomy Removal of cyst   PAIN:  Are you having pain? Yes: NPRS scale: 8/10 Pain location: back (low) Rt thigh  Pain description: throbbing, aching, stiffess , burning  Aggravating factors: standing, activity  Relieving factors: oxycodone, R  side, pillows    PRECAUTIONS: Fall  WEIGHT BEARING RESTRICTIONS: No  FALLS:  Has patient fallen in last 6 months? Yes.  Number of falls 2, back gave out   LIVING ENVIRONMENT: Lives with: lives with their family Lives in: House/apartment Stairs: Yes: External: 4 steps; on right going up Has following equipment at home: Walker - 2 wheeled  OCCUPATION: was working full time   PLOF: Independent Mom has to do the laundry, dishes and cooking.  PATIENT GOALS: I want to be able to do what I was before the surgery.    NEXT MD VISIT: Unknown  OBJECTIVE:   DIAGNOSTIC FINDINGS:  MRI lumbar spine wo contrast 08/23/2022: 1. Left laminectomy L4-5 with resection of synovial cyst. No recurrent synovial cyst. Improvement in subarticular stenosis on the left. 2. Mild facet degeneration L5-S1 without neural impingement. 3. No cause for right leg radiculopathy identified.   MRI lumbar spine wo contrast 05/29/2021: L4-5 and L5-S1 facet osteoarthritis with mild anterolisthesis. At L4-5 there is a 5 mm left facet synovial cyst which compresses the left L5 nerve root at the subarticular recess   .IMPRESSION: 1. Posterior and interbody fusion changes at L4-5. No obvious complicating features associated with the hardware. 2. Significant marrow edema involving the L4 and L5 vertebral bodies along with subsequent enhancement. This is likely a normal finding this soon after surgery with bony remodeling and stress related changes. I do not see any worrisome fluid collections or enhancement in the disc space to suggest infection. 3. Fluid in the laminectomy sites which is probably expected postoperative fluid collections. Would certainly correlate with clinical findings to exclude the possibility of infection but I think it is unlikely. 4. Possible small stress fracture involving the L4 vertebral body just above the inferior endplate. 5. The other intervertebral disc spaces are unremarkable.    PATIENT SURVEYS:  FOTO 30%  SCREENING FOR RED FLAGS: Bowel or bladder incontinence: No Spinal tumors: No Cauda equina  syndrome: No Compression fracture: No Abdominal aneurysm: No  COGNITION: Overall cognitive status: Within functional  limits for tasks assessed     SENSATION: Reports vague intermittent numbness tingling in lower extremities especially with standing and walking  MUSCLE LENGTH: Unable to test as patient would not lie supine POSTURE: rounded shoulders and decreased lumbar lordosis  PALPATION: Significant pain central L4-5 and into gluteals bilaterally  LUMBAR ROM:   AROM eval  Flexion 50% limited, pain and spasm  Extension 50% limited   Right lateral flexion Pain  Left lateral flexion Pain  Right rotation Limited 25% bilateral  Left rotation Limited 25% bilateral    (Blank rows = not tested)  LOWER EXTREMITY ROM:   NT  Passive  Right eval Left eval  Hip flexion    Hip extension    Hip abduction    Hip adduction    Hip internal rotation    Hip external rotation    Knee flexion    Knee extension    Ankle dorsiflexion    Ankle plantarflexion    Ankle inversion    Ankle eversion     (Blank rows = not tested)  LOWER EXTREMITY MMT:    MMT Right eval Left eval  Hip flexion 3+ 3+  Hip extension    Hip abduction NT 3-/5  Hip adduction    Hip internal rotation    Hip external rotation    Knee flexion 4 4  Knee extension 4+ 4+  Ankle dorsiflexion 4+ 4+  Ankle plantarflexion    Ankle inversion    Ankle eversion     (Blank rows = not tested)  LUMBAR SPECIAL TESTS:  NT on eval  FUNCTIONAL TESTS:  5 times sit to stand: 41 sec no UE support  , usually uses hands on thighs   GAIT: Distance walked: 150 Assistive device utilized: None Level of assistance: Modified independence Comments: slower pace, antalgic   TODAY'S TREATMENT:                                                                                                                              DATE: 02/22/23  Manual Therapy Soft Tissue Mobilization: lumbar, STW/M to bil lumbar paraspinals with pt  positioned in sitting leaning onto elevated treatment table to decrease pain and tone    Modalities  Date:  Unattended Estim: Lumbar, IFC 80-150 Hz, 15 mins, Pain Hot Pack: Lumbar, 15 mins, Pain and Tone   PATIENT EDUCATION:  Education details: see above  Person educated: Patient and Parent Education method: Explanation, Demonstration, Verbal cues, and Handouts Education comprehension: verbalized understanding and needs further education  HOME EXERCISE PROGRAM: Access Code: [email or web address] URL: [email or web address] Date: 02/13/2023 Prepared by: Karie Mainland  Exercises - Seated Cat Cow  - 2 x daily - 7 x weekly - 2 sets - 10 reps - 10 hold - Cat Cow to Child's Pose  - 2 x daily - 7 x weekly - 2 sets - 10 reps - 10-15 hold - Standing Transverse Abdominis Contraction  - 2 x daily -  7 x weekly - 2 sets - 10 reps - 10 hold  ASSESSMENT:  CLINICAL IMPRESSION: Pt arrives for today's treatment session reporting 8/10 low back pain.   STW/M performed to bil lumbar paraspinals to decrease pain and tone.  Normal responses to estim and MH noted upon removal.  Pt has CT scan on Friday with results being read on Tuesday.  Pt plans to call the facility if changes need to be made with further appointments.  Encouraged pt to find her home TENS unit and to bring it with her to her next visit.  Pt reported decrease in pain at completion of today's treatment session.  OBJECTIVE IMPAIRMENTS: Abnormal gait, decreased activity tolerance, decreased mobility, difficulty walking, decreased ROM, decreased strength, increased fascial restrictions, increased muscle spasms, impaired flexibility, impaired sensation, postural dysfunction, obesity, and pain.   ACTIVITY LIMITATIONS: carrying, lifting, bending, sitting, standing, squatting, sleeping, stairs, transfers, bed mobility, locomotion level, and caring for others  PARTICIPATION LIMITATIONS: meal prep, cleaning, laundry, medication management,  interpersonal relationship, driving, shopping, community activity, and occupation  PERSONAL FACTORS: Time since onset of injury/illness/exacerbation and 3+ comorbidities: previous back surgery, diabetes, obesity   are also affecting patient's functional outcome.   REHAB POTENTIAL: Good  CLINICAL DECISION MAKING: Evolving/moderate complexity  EVALUATION COMPLEXITY: Moderate   GOALS: Goals reviewed with patient? No     LONG TERM GOALS: Target date: 03/27/2023    Pt will be able to show Independence with HEP for core and LE Baseline: none Goal status: INITIAL  2.  Pt will be able to perform 5 X STS in < 25 sec to demo improved functional mobility  Baseline: 41 sec  Goal status: INITIAL  3.  Pt will be able to perform light home tasks and ADLs with no more than min increased pain in back and legs.  Baseline: unable, mom has to do  Goal status: INITIAL  4.  FOTO score will improve to 47% to demo improved functional mobility  Baseline: 30% Goal status: INITIAL  5.  Pt will be able to tolerate supine for short periods of time (20 min or more) in order to allow HEP and sleep  Baseline: unable , sleeps with multiple pillows  Goal status: INITIAL  6.  Further goals TBA by treating PT  Baseline:  Goal status: INITIAL  PLAN:  PT FREQUENCY: 2x/week  PT DURATION: 6 weeks  PLANNED INTERVENTIONS: Therapeutic exercises, Therapeutic activity, Neuromuscular re-education, Balance training, Gait training, Patient/Family education, Self Care, Joint mobilization, Dry Needling, Electrical stimulation, Spinal mobilization, Cryotherapy, Moist heat, Manual therapy, and Re-evaluation.  PLAN FOR NEXT SESSION: dry needling per MD.  Needs HEP and core, hip strength.  Prefers to sit, stand or S/L due to back pain .  Modalities as needed.    Newman Pies, PTA 02/22/2023, 3:02 PM

## 2023-02-23 ENCOUNTER — Other Ambulatory Visit: Payer: Self-pay

## 2023-02-23 ENCOUNTER — Encounter: Payer: Commercial Managed Care - PPO | Admitting: Registered Nurse

## 2023-02-24 ENCOUNTER — Other Ambulatory Visit (HOSPITAL_COMMUNITY): Payer: Self-pay

## 2023-02-24 ENCOUNTER — Ambulatory Visit (HOSPITAL_BASED_OUTPATIENT_CLINIC_OR_DEPARTMENT_OTHER)
Admission: RE | Admit: 2023-02-24 | Discharge: 2023-02-24 | Disposition: A | Discharge status: Home / Self Care | Payer: Commercial Managed Care - PPO | Source: Ambulatory Visit | Attending: Student | Admitting: Student

## 2023-02-24 ENCOUNTER — Encounter: Payer: Commercial Managed Care - PPO | Admitting: Registered Nurse

## 2023-02-24 DIAGNOSIS — M545 Low back pain, unspecified: Secondary | ICD-10-CM | POA: Diagnosis not present

## 2023-02-24 DIAGNOSIS — M47816 Spondylosis without myelopathy or radiculopathy, lumbar region: Secondary | ICD-10-CM | POA: Diagnosis not present

## 2023-02-24 DIAGNOSIS — M4316 Spondylolisthesis, lumbar region: Secondary | ICD-10-CM

## 2023-02-28 ENCOUNTER — Ambulatory Visit: Payer: Commercial Managed Care - PPO | Admitting: Registered Nurse

## 2023-02-28 DIAGNOSIS — M4316 Spondylolisthesis, lumbar region: Secondary | ICD-10-CM | POA: Diagnosis not present

## 2023-02-28 DIAGNOSIS — Z6828 Body mass index (BMI) 28.0-28.9, adult: Secondary | ICD-10-CM | POA: Diagnosis not present

## 2023-03-01 ENCOUNTER — Ambulatory Visit: Payer: Commercial Managed Care - PPO

## 2023-03-01 DIAGNOSIS — M5416 Radiculopathy, lumbar region: Secondary | ICD-10-CM | POA: Diagnosis not present

## 2023-03-01 DIAGNOSIS — M6281 Muscle weakness (generalized): Secondary | ICD-10-CM

## 2023-03-01 DIAGNOSIS — M5459 Other low back pain: Secondary | ICD-10-CM

## 2023-03-01 NOTE — Therapy (Signed)
OUTPATIENT PHYSICAL THERAPY THORACOLUMBAR TREATMENT   Patient Name: Amy Adams MRN: [id number] DOB:1966/01/02, 57 y.o., female Today's Date: 03/01/2023  END OF SESSION:  PT End of Session - 03/01/23 1120     Visit Number 4    Number of Visits 12    Date for PT Re-Evaluation 03/27/23    Authorization Type [facility] [employer]    PT Start Time 1115    PT Stop Time 1214    PT Time Calculation (min) 59 min    Activity Tolerance Patient limited by pain    Behavior During Therapy WFL for tasks assessed/performed             Past Medical History:  Diagnosis Date   Anxiety    Arthritis    Chronic foot pain, left    GERD (gastroesophageal reflux disease)    History of anal fissures    Hyperlipidemia    Hypertension    Hypothyroidism    Insulin dependent type 2 diabetes mellitus    endrocrinologist-- dr Talmage Nap   Migraines    Peripheral neuropathy    RA (rheumatoid arthritis)    rheumatologist-  dr Dierdre Forth   SUI (stress urinary incontinence, female)    Wears glasses    Past Surgical History:  Procedure Laterality Date   ABDOMINAL HYSTERECTOMY  1990   with Right Salpingo--ophorectomy   ANAL SPHINCTEROTOMY  01-05-2006    dr Zachery Dakins    BUNIONECTOMY  09-25-2009   dr Al Corpus    right great toe   CARPAL TUNNEL RELEASE Bilateral 1996;  1997   CESAREAN SECTION  1988   FOOT SURGERY  12/ 2014   dr Al Corpus   left 2nd hammertoe repair,  left 2nd metatarsal matrixectomy, left heel endoscopic plantar fasiotomy   IR CATHETER TUBE CHANGE  01/01/2019   IR RADIOLOGIST EVAL & MGMT  11/28/2018   IR RADIOLOGIST EVAL & MGMT  11/29/2018   IR RADIOLOGIST EVAL & MGMT  12/06/2018   IR RADIOLOGIST EVAL & MGMT  12/25/2018   IR RADIOLOGIST EVAL & MGMT  01/08/2019   IR RADIOLOGIST EVAL & MGMT  01/29/2019   PANNICULECTOMY N/A 10/26/2018   Procedure: PANNICULECTOMY;  Surgeon: Luretha Murphy, MD;  Location: [facility];  Service: General;  Laterality: N/A;   TENDON REPAIR  09/2015   left  posterior tibial and peroneal tendon repairs   TOE AMPUTATION  01/2015   right 2nd toe   TONSILLECTOMY  age 64   TRANSOBTURATOR SLING  01-08-2004   dr Annabell Howells    Patient Active Problem List   Diagnosis Date Noted   Degeneration of lumbar intervertebral disc 03/10/2022   Generalized psoriasis 03/10/2022   Other long term (current) drug therapy 03/10/2022   Overweight 03/10/2022   Pain in limb 03/10/2022   Primary osteoarthritis 03/10/2022   Synovial cyst 03/10/2022   S/P panniculectomy 10/26/2018   Panniculitis 09/04/2018   HYPOTHYROIDISM 12/01/2010   Type 2 diabetes mellitus with diabetic polyneuropathy, with long-term current use of insulin 12/01/2010   DIAB W/NEURO MANIFESTS TYPE II/UNS NOT UNCNTRL 12/01/2010   OTHER AND UNSPECIFIED HYPERLIPIDEMIA 12/01/2010   OBESITY, UNSPECIFIED 12/01/2010   ESSENTIAL HYPERTENSION, BENIGN 12/01/2010   Rheumatoid arthritis 12/01/2010    PCP: Johny Blamer MD   REFERRING PROVIDER:  Tia Alert, MD  REFERRING DIAG: M43.16 (ICD-10-CM) - Spondylolisthesis, lumbar region  Rationale for Evaluation and Treatment: Rehabilitation  THERAPY DIAG:  Other low back pain  Radiculopathy, lumbar region  Muscle weakness (generalized)  ONSET DATE: chronic  SUBJECTIVE:                                                                                                                                                                                           SUBJECTIVE STATEMENT: Pt report 6/10 low back pain today.   PERTINENT HISTORY:  See above  Lumbar L4-L5 fusion, laminectomy Removal of cyst   PAIN:  Are you having pain? Yes: NPRS scale: 8/10 Pain location: back (low) Rt thigh  Pain description: throbbing, aching, stiffess , burning  Aggravating factors: standing, activity  Relieving factors: oxycodone, R  side, pillows    PRECAUTIONS: Fall  WEIGHT BEARING RESTRICTIONS: No  FALLS:  Has patient fallen in last 6 months? Yes.  Number of falls 2, back gave out   LIVING ENVIRONMENT: Lives with: lives with their family Lives in: House/apartment Stairs: Yes: External: 4 steps; on right going up Has following equipment at home: Walker - 2 wheeled  OCCUPATION: was working full time   PLOF: Independent Mom has to do the laundry, dishes and cooking.  PATIENT GOALS: I want to be able to do what I was before the surgery.    NEXT MD VISIT: Unknown  OBJECTIVE:   DIAGNOSTIC FINDINGS:  MRI lumbar spine wo contrast 08/23/2022: 1. Left laminectomy L4-5 with resection of synovial cyst. No recurrent synovial cyst. Improvement in subarticular stenosis on the left. 2. Mild facet degeneration L5-S1 without neural impingement. 3. No cause for right leg radiculopathy identified.   MRI lumbar spine wo contrast 05/29/2021: L4-5 and L5-S1 facet osteoarthritis with mild anterolisthesis. At L4-5 there is a 5 mm left facet synovial cyst which compresses the left L5 nerve root at the subarticular recess   .IMPRESSION: 1. Posterior and interbody fusion changes at L4-5. No obvious complicating features associated with the hardware. 2. Significant marrow edema involving the L4 and L5 vertebral bodies along with subsequent enhancement. This is likely a normal finding this soon after surgery with bony remodeling and stress related changes. I do not see any worrisome fluid collections or enhancement in the disc space to suggest infection. 3. Fluid in the laminectomy sites which is probably expected postoperative fluid collections. Would certainly correlate with clinical findings to exclude the possibility of infection but I think it is unlikely. 4. Possible small stress fracture involving the L4 vertebral body just above the inferior endplate. 5. The other intervertebral disc spaces are unremarkable.    PATIENT SURVEYS:  FOTO 30%  SCREENING FOR RED FLAGS: Bowel or bladder incontinence: No Spinal tumors: No Cauda equina  syndrome: No Compression fracture: No Abdominal aneurysm: No  COGNITION: Overall cognitive status: Within functional  limits for tasks assessed     SENSATION: Reports vague intermittent numbness tingling in lower extremities especially with standing and walking  MUSCLE LENGTH: Unable to test as patient would not lie supine POSTURE: rounded shoulders and decreased lumbar lordosis  PALPATION: Significant pain central L4-5 and into gluteals bilaterally  LUMBAR ROM:   AROM eval  Flexion 50% limited, pain and spasm  Extension 50% limited   Right lateral flexion Pain  Left lateral flexion Pain  Right rotation Limited 25% bilateral  Left rotation Limited 25% bilateral    (Blank rows = not tested)  LOWER EXTREMITY ROM:   NT  Passive  Right eval Left eval  Hip flexion    Hip extension    Hip abduction    Hip adduction    Hip internal rotation    Hip external rotation    Knee flexion    Knee extension    Ankle dorsiflexion    Ankle plantarflexion    Ankle inversion    Ankle eversion     (Blank rows = not tested)  LOWER EXTREMITY MMT:    MMT Right eval Left eval  Hip flexion 3+ 3+  Hip extension    Hip abduction NT 3-/5  Hip adduction    Hip internal rotation    Hip external rotation    Knee flexion 4 4  Knee extension 4+ 4+  Ankle dorsiflexion 4+ 4+  Ankle plantarflexion    Ankle inversion    Ankle eversion     (Blank rows = not tested)  LUMBAR SPECIAL TESTS:  NT on eval  FUNCTIONAL TESTS:  5 times sit to stand: 41 sec no UE support  , usually uses hands on thighs   GAIT: Distance walked: 150 Assistive device utilized: None Level of assistance: Modified independence Comments: slower pace, antalgic   TODAY'S TREATMENT:                                                                                                                              DATE: 03/01/23                                     EXERCISE LOG  Exercise Repetitions and Resistance  Comments  Nustep  Lvl 3 x 11 mins   [employer] mins with 3 sec hold   [employer] X2 mins with 3 sec hold    Blank cell = exercise not performed today   Manual Therapy Soft Tissue Mobilization: lumbar, STW/M to bil lumbar paraspinals with pt positioned in sitting leaning onto elevated treatment table to decrease pain and tone    Modalities  Date:  Unattended Estim: Lumbar, IFC 80-150 Hz, 15 mins, Pain Hot Pack: Lumbar, 15 mins, Pain and Tone   PATIENT EDUCATION:  Education details: see above  Person educated: Patient and Parent Education method: Explanation, Demonstration, Verbal cues,  and Handouts Education comprehension: verbalized understanding and needs further education  HOME EXERCISE PROGRAM: Access Code: [email or web address] URL: [email or web address] Date: 02/13/2023 Prepared by: Karie Mainland  Exercises - Seated Cat Cow  - 2 x daily - 7 x weekly - 2 sets - 10 reps - 10 hold - Cat Cow to Child's Pose  - 2 x daily - 7 x weekly - 2 sets - 10 reps - 10-15 hold - Standing Transverse Abdominis Contraction  - 2 x daily - 7 x weekly - 2 sets - 10 reps - 10 hold  ASSESSMENT:  CLINICAL IMPRESSION: Pt arrives for today's treatment session reporting 6/10 low back pain.   Pt able to tolerate introduction to Nustep today with minimal increase in low back discomfort.  Pt also instructed in seated physio ball exercises as she has a physio ball at home.  Pt requiring min verbal cues and demonstration for proper technique and hold.  STW/M performed to bil lumbar paraspinals with tone greater on right than left.  Normal responses to estim and MH noted upon removal. Pt reported 4/10 low back pain at completion of today's treatment session.  OBJECTIVE IMPAIRMENTS: Abnormal gait, decreased activity tolerance, decreased mobility, difficulty walking, decreased ROM, decreased strength, increased fascial restrictions, increased muscle spasms, impaired flexibility, impaired sensation,  postural dysfunction, obesity, and pain.   ACTIVITY LIMITATIONS: carrying, lifting, bending, sitting, standing, squatting, sleeping, stairs, transfers, bed mobility, locomotion level, and caring for others  PARTICIPATION LIMITATIONS: meal prep, cleaning, laundry, medication management, interpersonal relationship, driving, shopping, community activity, and occupation  PERSONAL FACTORS: Time since onset of injury/illness/exacerbation and 3+ comorbidities: previous back surgery, diabetes, obesity   are also affecting patient's functional outcome.   REHAB POTENTIAL: Good  CLINICAL DECISION MAKING: Evolving/moderate complexity  EVALUATION COMPLEXITY: Moderate   GOALS: Goals reviewed with patient? No     LONG TERM GOALS: Target date: 03/27/2023    Pt will be able to show Independence with HEP for core and LE Baseline: none Goal status: INITIAL  2.  Pt will be able to perform 5 X STS in < 25 sec to demo improved functional mobility  Baseline: 41 sec  Goal status: INITIAL  3.  Pt will be able to perform light home tasks and ADLs with no more than min increased pain in back and legs.  Baseline: unable, mom has to do  Goal status: INITIAL  4.  FOTO score will improve to 47% to demo improved functional mobility  Baseline: 30% Goal status: INITIAL  5.  Pt will be able to tolerate supine for short periods of time (20 min or more) in order to allow HEP and sleep  Baseline: unable , sleeps with multiple pillows  Goal status: INITIAL  6.  Further goals TBA by treating PT  Baseline:  Goal status: INITIAL  PLAN:  PT FREQUENCY: 2x/week  PT DURATION: 6 weeks  PLANNED INTERVENTIONS: Therapeutic exercises, Therapeutic activity, Neuromuscular re-education, Balance training, Gait training, Patient/Family education, Self Care, Joint mobilization, Dry Needling, Electrical stimulation, Spinal mobilization, Cryotherapy, Moist heat, Manual therapy, and Re-evaluation.  PLAN FOR NEXT SESSION:  dry needling per MD.  Needs HEP and core, hip strength.  Prefers to sit, stand or S/L due to back pain .  Modalities as needed.    Newman Pies, PTA 03/01/2023, 12:28 PM

## 2023-03-08 ENCOUNTER — Other Ambulatory Visit (HOSPITAL_COMMUNITY): Payer: Self-pay

## 2023-03-08 ENCOUNTER — Ambulatory Visit: Payer: Commercial Managed Care - PPO

## 2023-03-08 DIAGNOSIS — M5416 Radiculopathy, lumbar region: Secondary | ICD-10-CM

## 2023-03-08 DIAGNOSIS — M6281 Muscle weakness (generalized): Secondary | ICD-10-CM | POA: Diagnosis not present

## 2023-03-08 DIAGNOSIS — M5459 Other low back pain: Secondary | ICD-10-CM | POA: Diagnosis not present

## 2023-03-08 NOTE — Therapy (Signed)
OUTPATIENT PHYSICAL THERAPY THORACOLUMBAR TREATMENT   Patient Name: Amy Adams MRN: [id number] DOB:1965-12-28, 57 y.o., female Today's Date: 03/08/2023  END OF SESSION:  PT End of Session - 03/08/23 1122     Visit Number 5    Number of Visits 12    Date for PT Re-Evaluation 03/27/23    Authorization Type [facility] [employer]    PT Start Time 1115    PT Stop Time 1219    PT Time Calculation (min) 64 min    Activity Tolerance Patient limited by pain    Behavior During Therapy WFL for tasks assessed/performed             Past Medical History:  Diagnosis Date   Anxiety    Arthritis    Chronic foot pain, left    GERD (gastroesophageal reflux disease)    History of anal fissures    Hyperlipidemia    Hypertension    Hypothyroidism    Insulin dependent type 2 diabetes mellitus    endrocrinologist-- dr Talmage Nap   Migraines    Peripheral neuropathy    RA (rheumatoid arthritis)    rheumatologist-  dr Dierdre Forth   SUI (stress urinary incontinence, female)    Wears glasses    Past Surgical History:  Procedure Laterality Date   ABDOMINAL HYSTERECTOMY  1990   with Right Salpingo--ophorectomy   ANAL SPHINCTEROTOMY  01-05-2006    dr Zachery Dakins    BUNIONECTOMY  09-25-2009   dr Al Corpus    right great toe   CARPAL TUNNEL RELEASE Bilateral 1996;  1997   CESAREAN SECTION  1988   FOOT SURGERY  12/ 2014   dr Al Corpus   left 2nd hammertoe repair,  left 2nd metatarsal matrixectomy, left heel endoscopic plantar fasiotomy   IR CATHETER TUBE CHANGE  01/01/2019   IR RADIOLOGIST EVAL & MGMT  11/28/2018   IR RADIOLOGIST EVAL & MGMT  11/29/2018   IR RADIOLOGIST EVAL & MGMT  12/06/2018   IR RADIOLOGIST EVAL & MGMT  12/25/2018   IR RADIOLOGIST EVAL & MGMT  01/08/2019   IR RADIOLOGIST EVAL & MGMT  01/29/2019   PANNICULECTOMY N/A 10/26/2018   Procedure: PANNICULECTOMY;  Surgeon: Luretha Murphy, MD;  Location: [facility];  Service: General;  Laterality: N/A;   TENDON REPAIR  09/2015   left  posterior tibial and peroneal tendon repairs   TOE AMPUTATION  01/2015   right 2nd toe   TONSILLECTOMY  age 48   TRANSOBTURATOR SLING  01-08-2004   dr Annabell Howells    Patient Active Problem List   Diagnosis Date Noted   Degeneration of lumbar intervertebral disc 03/10/2022   Generalized psoriasis 03/10/2022   Other long term (current) drug therapy 03/10/2022   Overweight 03/10/2022   Pain in limb 03/10/2022   Primary osteoarthritis 03/10/2022   Synovial cyst 03/10/2022   S/P panniculectomy 10/26/2018   Panniculitis 09/04/2018   HYPOTHYROIDISM 12/01/2010   Type 2 diabetes mellitus with diabetic polyneuropathy, with long-term current use of insulin 12/01/2010   DIAB W/NEURO MANIFESTS TYPE II/UNS NOT UNCNTRL 12/01/2010   OTHER AND UNSPECIFIED HYPERLIPIDEMIA 12/01/2010   OBESITY, UNSPECIFIED 12/01/2010   ESSENTIAL HYPERTENSION, BENIGN 12/01/2010   Rheumatoid arthritis 12/01/2010    PCP: Johny Blamer MD   REFERRING PROVIDER:  Tia Alert, MD  REFERRING DIAG: M43.16 (ICD-10-CM) - Spondylolisthesis, lumbar region  Rationale for Evaluation and Treatment: Rehabilitation  THERAPY DIAG:  Other low back pain  Radiculopathy, lumbar region  Muscle weakness (generalized)  ONSET DATE: chronic  SUBJECTIVE:                                                                                                                                                                                           SUBJECTIVE STATEMENT: Pt report 8/10 low back pain today.   PERTINENT HISTORY:  See above  Lumbar L4-L5 fusion, laminectomy Removal of cyst   PAIN:  Are you having pain? Yes: NPRS scale: 8/10 Pain location: back (low) Rt thigh  Pain description: throbbing, aching, stiffess , burning  Aggravating factors: standing, activity  Relieving factors: oxycodone, R  side, pillows    PRECAUTIONS: Fall  WEIGHT BEARING RESTRICTIONS: No  FALLS:  Has patient fallen in last 6 months? Yes.  Number of falls 2, back gave out   LIVING ENVIRONMENT: Lives with: lives with their family Lives in: House/apartment Stairs: Yes: External: 4 steps; on right going up Has following equipment at home: Walker - 2 wheeled  OCCUPATION: was working full time   PLOF: Independent Mom has to do the laundry, dishes and cooking.  PATIENT GOALS: I want to be able to do what I was before the surgery.    NEXT MD VISIT: Unknown  OBJECTIVE:   DIAGNOSTIC FINDINGS:  MRI lumbar spine wo contrast 08/23/2022: 1. Left laminectomy L4-5 with resection of synovial cyst. No recurrent synovial cyst. Improvement in subarticular stenosis on the left. 2. Mild facet degeneration L5-S1 without neural impingement. 3. No cause for right leg radiculopathy identified.   MRI lumbar spine wo contrast 05/29/2021: L4-5 and L5-S1 facet osteoarthritis with mild anterolisthesis. At L4-5 there is a 5 mm left facet synovial cyst which compresses the left L5 nerve root at the subarticular recess   .IMPRESSION: 1. Posterior and interbody fusion changes at L4-5. No obvious complicating features associated with the hardware. 2. Significant marrow edema involving the L4 and L5 vertebral bodies along with subsequent enhancement. This is likely a normal finding this soon after surgery with bony remodeling and stress related changes. I do not see any worrisome fluid collections or enhancement in the disc space to suggest infection. 3. Fluid in the laminectomy sites which is probably expected postoperative fluid collections. Would certainly correlate with clinical findings to exclude the possibility of infection but I think it is unlikely. 4. Possible small stress fracture involving the L4 vertebral body just above the inferior endplate. 5. The other intervertebral disc spaces are unremarkable.    PATIENT SURVEYS:  FOTO 30%  SCREENING FOR RED FLAGS: Bowel or bladder incontinence: No Spinal tumors: No Cauda equina  syndrome: No Compression fracture: No Abdominal aneurysm: No  COGNITION: Overall cognitive status: Within functional  limits for tasks assessed     SENSATION: Reports vague intermittent numbness tingling in lower extremities especially with standing and walking  MUSCLE LENGTH: Unable to test as patient would not lie supine POSTURE: rounded shoulders and decreased lumbar lordosis  PALPATION: Significant pain central L4-5 and into gluteals bilaterally  LUMBAR ROM:   AROM eval  Flexion 50% limited, pain and spasm  Extension 50% limited   Right lateral flexion Pain  Left lateral flexion Pain  Right rotation Limited 25% bilateral  Left rotation Limited 25% bilateral    (Blank rows = not tested)  LOWER EXTREMITY ROM:   NT  Passive  Right eval Left eval  Hip flexion    Hip extension    Hip abduction    Hip adduction    Hip internal rotation    Hip external rotation    Knee flexion    Knee extension    Ankle dorsiflexion    Ankle plantarflexion    Ankle inversion    Ankle eversion     (Blank rows = not tested)  LOWER EXTREMITY MMT:    MMT Right eval Left eval  Hip flexion 3+ 3+  Hip extension    Hip abduction NT 3-/5  Hip adduction    Hip internal rotation    Hip external rotation    Knee flexion 4 4  Knee extension 4+ 4+  Ankle dorsiflexion 4+ 4+  Ankle plantarflexion    Ankle inversion    Ankle eversion     (Blank rows = not tested)  LUMBAR SPECIAL TESTS:  NT on eval  FUNCTIONAL TESTS:  5 times sit to stand: 41 sec no UE support  , usually uses hands on thighs   GAIT: Distance walked: 150 Assistive device utilized: None Level of assistance: Modified independence Comments: slower pace, antalgic   TODAY'S TREATMENT:                                                                                                                              DATE: 03/08/23                                     EXERCISE LOG  Exercise Repetitions and Resistance  Comments  Nustep  Lvl 3 x 15 mins   [employer] cell = exercise not performed today   Manual Therapy Soft Tissue Mobilization: lumbar, STW/M to bil lumbar paraspinals with pt positioned in sitting leaning onto elevated treatment table to decrease pain and tone    Modalities  Date:  Unattended Estim: Lumbar, IFC 80-150 Hz, 15 mins, Pain Hot Pack: Lumbar, 15 mins, Pain and Tone   PATIENT EDUCATION:  Education details: see above  Person educated: Patient and Parent Education method: Explanation, Demonstration, Verbal cues, and Handouts Education comprehension: verbalized understanding and needs further education  HOME EXERCISE PROGRAM: Access Code: [email or web address] URL: [email or web address] Date: 02/13/2023 Prepared by: Karie Mainland  Exercises - Seated Cat Cow  - 2 x daily - 7 x weekly - 2 sets - 10 reps - 10 hold - Cat Cow to Child's Pose  - 2 x daily - 7 x weekly - 2 sets - 10 reps - 10-15 hold - Standing Transverse Abdominis Contraction  - 2 x daily - 7 x weekly - 2 sets - 10 reps - 10 hold  ASSESSMENT:  CLINICAL IMPRESSION: Pt arrives for today's treatment session reporting 8/10 low back pain.  Pt able to increase FOTO score to 43 today.  Pt able to tolerate increased time on Nustep today without issue or complaint of pain.  Pt performing stretches at home without issue.  Pt instructed on how to use personal TENS unit with pt able to demonstrate understanding.  Normal responses to estim and MH noted upon removal.  Pt reported decreased pain at completion of today's treatment session.  Pt eager to try dry needling at next session.  OBJECTIVE IMPAIRMENTS: Abnormal gait, decreased activity tolerance, decreased mobility, difficulty walking, decreased ROM, decreased strength, increased fascial restrictions, increased muscle spasms, impaired flexibility, impaired sensation, postural dysfunction, obesity, and pain.   ACTIVITY LIMITATIONS: carrying,  lifting, bending, sitting, standing, squatting, sleeping, stairs, transfers, bed mobility, locomotion level, and caring for others  PARTICIPATION LIMITATIONS: meal prep, cleaning, laundry, medication management, interpersonal relationship, driving, shopping, community activity, and occupation  PERSONAL FACTORS: Time since onset of injury/illness/exacerbation and 3+ comorbidities: previous back surgery, diabetes, obesity   are also affecting patient's functional outcome.   REHAB POTENTIAL: Good  CLINICAL DECISION MAKING: Evolving/moderate complexity  EVALUATION COMPLEXITY: Moderate   GOALS: Goals reviewed with patient? No     LONG TERM GOALS: Target date: 03/27/2023    Pt will be able to show Independence with HEP for core and LE Baseline: none Goal status: IN PROGRESS  2.  Pt will be able to perform 5 X STS in < 25 sec to demo improved functional mobility  Baseline: 41 sec  4/24: 27 seconds Goal status: IN PROGRESS  3.  Pt will be able to perform light home tasks and ADLs with no more than min increased pain in back and legs.  Baseline: unable, mom has to do  Goal status: IN PROGRESS  4.  FOTO score will improve to 47% to demo improved functional mobility  Baseline: 30% 4/24: 43% Goal status: IN PROGRESS  5.  Pt will be able to tolerate supine for short periods of time (20 min or more) in order to allow HEP and sleep  Baseline: unable , sleeps with multiple pillows  Goal status: IN PROGRESS  6.  Further goals TBA by treating PT  Baseline:  Goal status: IN PROGRESS  PLAN:  PT FREQUENCY: 2x/week  PT DURATION: 6 weeks  PLANNED INTERVENTIONS: Therapeutic exercises, Therapeutic activity, Neuromuscular re-education, Balance training, Gait training, Patient/Family education, Self Care, Joint mobilization, Dry Needling, Electrical stimulation, Spinal mobilization, Cryotherapy, Moist heat, Manual therapy, and Re-evaluation.  PLAN FOR NEXT SESSION: dry needling per MD.   Needs HEP and core, hip strength.  Prefers to sit, stand or S/L due to back pain .  Modalities as needed.    Newman Pies, PTA 03/08/2023, 12:22 PM

## 2023-03-09 ENCOUNTER — Other Ambulatory Visit (HOSPITAL_COMMUNITY): Payer: Self-pay

## 2023-03-09 MED ORDER — OXYCODONE-ACETAMINOPHEN 10-325 MG PO TABS
1.0000 | ORAL_TABLET | Freq: Four times a day (QID) | ORAL | 0 refills | Status: DC | PRN
  Filled 2023-03-09: qty 55, 7d supply, fill #0

## 2023-03-13 ENCOUNTER — Encounter: Payer: Self-pay | Admitting: Physical Therapy

## 2023-03-13 ENCOUNTER — Ambulatory Visit: Payer: Commercial Managed Care - PPO | Admitting: Physical Therapy

## 2023-03-13 DIAGNOSIS — M5459 Other low back pain: Secondary | ICD-10-CM

## 2023-03-13 DIAGNOSIS — M5416 Radiculopathy, lumbar region: Secondary | ICD-10-CM

## 2023-03-13 DIAGNOSIS — M6281 Muscle weakness (generalized): Secondary | ICD-10-CM | POA: Diagnosis not present

## 2023-03-13 NOTE — Therapy (Signed)
OUTPATIENT PHYSICAL THERAPY THORACOLUMBAR TREATMENT   Patient Name: Amy Adams MRN: [id number] DOB:[birth year], 57 y.o., female Today's Date: 03/13/2023  END OF SESSION:  PT End of Session - 03/13/23 1207     Visit Number 6    Number of Visits 12    Date for PT Re-Evaluation 03/27/23    Authorization Type [facility] [employer]    PT Start Time 1115    PT Stop Time 1214    PT Time Calculation (min) 59 min    Activity Tolerance Patient tolerated treatment well    Behavior During Therapy WFL for tasks assessed/performed             Past Medical History:  Diagnosis Date   Anxiety    Arthritis    Chronic foot pain, left    GERD (gastroesophageal reflux disease)    History of anal fissures    Hyperlipidemia    Hypertension    Hypothyroidism    Insulin dependent type 2 diabetes mellitus (HCC)    endrocrinologist-- dr Talmage Nap   Migraines    Peripheral neuropathy    RA (rheumatoid arthritis) [facility])    rheumatologist-  dr Dierdre Forth   SUI (stress urinary incontinence, female)    Wears glasses    Past Surgical History:  Procedure Laterality Date   ABDOMINAL HYSTERECTOMY  1990   with Right Salpingo--ophorectomy   ANAL SPHINCTEROTOMY  01-05-2006    dr Zachery Dakins @[facility]    BUNIONECTOMY  09-25-2009   dr Al Corpus @[facility]    right great toe   CARPAL TUNNEL RELEASE Bilateral 1996;  1997   CESAREAN SECTION  1988   FOOT SURGERY  12/ 2014   dr Al Corpus   left 2nd hammertoe repair,  left 2nd metatarsal matrixectomy, left heel endoscopic plantar fasiotomy   IR CATHETER TUBE CHANGE  01/01/2019   IR RADIOLOGIST EVAL & MGMT  11/28/2018   IR RADIOLOGIST EVAL & MGMT  11/29/2018   IR RADIOLOGIST EVAL & MGMT  12/06/2018   IR RADIOLOGIST EVAL & MGMT  12/25/2018   IR RADIOLOGIST EVAL & MGMT  01/08/2019   IR RADIOLOGIST EVAL & MGMT  01/29/2019   PANNICULECTOMY N/A 10/26/2018   Procedure: PANNICULECTOMY;  Surgeon: Luretha Murphy, MD;  Location: [facility];  Service: General;  Laterality: N/A;   TENDON REPAIR   09/2015   left posterior tibial and peroneal tendon repairs   TOE AMPUTATION  01/2015   right 2nd toe   TONSILLECTOMY  age 37   TRANSOBTURATOR SLING  01-08-2004   dr Annabell Howells @[facility]    Patient Active Problem List   Diagnosis Date Noted   Degeneration of lumbar intervertebral disc 03/10/2022   Generalized psoriasis 03/10/2022   Other long term (current) drug therapy 03/10/2022   Overweight 03/10/2022   Pain in limb 03/10/2022   Primary osteoarthritis 03/10/2022   Synovial cyst 03/10/2022   S/P panniculectomy 10/26/2018   Panniculitis 09/04/2018   HYPOTHYROIDISM 12/01/2010   Type 2 diabetes mellitus with diabetic polyneuropathy, with long-term current use of insulin (HCC) 12/01/2010   DIAB W/NEURO MANIFESTS TYPE II/UNS NOT UNCNTRL 12/01/2010   OTHER AND UNSPECIFIED HYPERLIPIDEMIA 12/01/2010   OBESITY, UNSPECIFIED 12/01/2010   ESSENTIAL HYPERTENSION, BENIGN 12/01/2010   Rheumatoid arthritis (HCC) 12/01/2010    PCP: Johny Blamer MD   REFERRING PROVIDER:  Tia Alert, MD  REFERRING DIAG: M43.16 (ICD-10-CM) - Spondylolisthesis, lumbar region  Rationale for Evaluation and Treatment: Rehabilitation  THERAPY DIAG:  Other low back pain  Radiculopathy, lumbar region  Muscle weakness (generalized)  ONSET  DATE: chronic   SUBJECTIVE:                                                                                                                                                                                           SUBJECTIVE STATEMENT: My pain was more than a 10 over the weekend.  PERTINENT HISTORY:  See above  Lumbar L4-L5 fusion, laminectomy Removal of cyst   PAIN:  Are you having pain? Yes: NPRS scale: 6/10 Pain location: back (low) Rt thigh  Pain description: throbbing, aching, stiffess , burning  Aggravating factors: standing, activity  Relieving factors: oxycodone, R  side, pillows    PRECAUTIONS: Fall                                    EXERCISE  LOG  Exercise Repetitions and Resistance Comments  Nustep  Lvl 3 x 15 mins   Trigger Point Dry-Needling  Treatment instructions: Expect mild to moderate muscle soreness. S/S of pneumothorax if dry needled over a lung field, and to seek immediate medical attention should they occur. Patient verbalized understanding of these instructions and education.  Patient Consent Given: Yes Education handout provided: Yes Muscles treated: Upper glut med  Left sdly position with pillows between knees for comfort:  STW/M x 9 minutes to patient's left upper gluteal region and SIJ F/b IFC at 80-150 Hz on 40% scan x 15 minutes.   PATIENT EDUCATION:  Education details:   Person educated:  International aid/development worker:  Education comprehension:  HOME EXERCISE PROGRAM:   ASSESSMENT:  CLINICAL IMPRESSION: Patient with severe pain over the weekend.  She tolerated dry needling to her right upper glut med today without complaint.  She is very tender to palpation over her right SIJ.  Patient tolerated treatment without complaint with normal modality response following removal of modality.  OBJECTIVE IMPAIRMENTS: Abnormal gait, decreased activity tolerance, decreased mobility, difficulty walking, decreased ROM, decreased strength, increased fascial restrictions, increased muscle spasms, impaired flexibility, impaired sensation, postural dysfunction, obesity, and pain.   ACTIVITY LIMITATIONS: carrying, lifting, bending, sitting, standing, squatting, sleeping, stairs, transfers, bed mobility, locomotion level, and caring for others  PARTICIPATION LIMITATIONS: meal prep, cleaning, laundry, medication management, interpersonal relationship, driving, shopping, community activity, and occupation  PERSONAL FACTORS: Time since onset of injury/illness/exacerbation and 3+ comorbidities: previous back surgery, diabetes, obesity   are also affecting patient's functional outcome.   REHAB POTENTIAL: Good  CLINICAL DECISION MAKING:  Evolving/moderate complexity  EVALUATION COMPLEXITY: Moderate   GOALS: Goals reviewed with patient? No     LONG TERM GOALS: Target  date: 03/27/2023    Pt will be able to show Independence with HEP for core and LE Baseline: none Goal status: IN PROGRESS  2.  Pt will be able to perform 5 X STS in < 25 sec to demo improved functional mobility  Baseline: 41 sec  4/24: 27 seconds Goal status: IN PROGRESS  3.  Pt will be able to perform light home tasks and ADLs with no more than min increased pain in back and legs.  Baseline: unable, mom has to do  Goal status: IN PROGRESS  4.  FOTO score will improve to 47% to demo improved functional mobility  Baseline: 30% 4/24: 43% Goal status: IN PROGRESS  5.  Pt will be able to tolerate supine for short periods of time (20 min or more) in order to allow HEP and sleep  Baseline: unable , sleeps with multiple pillows  Goal status: IN PROGRESS  6.  Further goals TBA by treating PT  Baseline:  Goal status: IN PROGRESS  PLAN:  PT FREQUENCY: 2x/week  PT DURATION: 6 weeks  PLANNED INTERVENTIONS: Therapeutic exercises, Therapeutic activity, Neuromuscular re-education, Balance training, Gait training, Patient/Family education, Self Care, Joint mobilization, Dry Needling, Electrical stimulation, Spinal mobilization, Cryotherapy, Moist heat, Manual therapy, and Re-evaluation.  PLAN FOR NEXT SESSION: dry needling per MD.  Needs HEP and core, hip strength.  Prefers to sit, stand or S/L due to back pain .  Modalities as needed.    Arvie Bartholomew, [place], PT 03/13/2023, 12:21 PM

## 2023-03-16 ENCOUNTER — Ambulatory Visit: Payer: Commercial Managed Care - PPO | Attending: Neurology

## 2023-03-16 DIAGNOSIS — M6281 Muscle weakness (generalized): Secondary | ICD-10-CM

## 2023-03-16 DIAGNOSIS — M5459 Other low back pain: Secondary | ICD-10-CM

## 2023-03-16 DIAGNOSIS — M5416 Radiculopathy, lumbar region: Secondary | ICD-10-CM

## 2023-03-16 NOTE — Therapy (Signed)
OUTPATIENT PHYSICAL THERAPY THORACOLUMBAR TREATMENT   Patient Name: Amy Adams MRN: [id number] DOB:July 04, 1966, 57 y.o., female Today's Date: 03/16/2023  END OF SESSION:  PT End of Session - 03/16/23 1519     Visit Number 7    Number of Visits 12    Date for PT Re-Evaluation 03/27/23    Authorization Type [facility] [employer]    PT Start Time 1515    PT Stop Time 1613    PT Time Calculation (min) 58 min    Activity Tolerance Patient tolerated treatment well    Behavior During Therapy WFL for tasks assessed/performed             Past Medical History:  Diagnosis Date   Anxiety    Arthritis    Chronic foot pain, left    GERD (gastroesophageal reflux disease)    History of anal fissures    Hyperlipidemia    Hypertension    Hypothyroidism    Insulin dependent type 2 diabetes mellitus (HCC)    endrocrinologist-- dr Talmage Nap   Migraines    Peripheral neuropathy    RA (rheumatoid arthritis) [facility])    rheumatologist-  dr Dierdre Forth   SUI (stress urinary incontinence, female)    Wears glasses    Past Surgical History:  Procedure Laterality Date   ABDOMINAL HYSTERECTOMY  1990   with Right Salpingo--ophorectomy   ANAL SPHINCTEROTOMY  01-05-2006    dr Zachery Dakins @[facility]    BUNIONECTOMY  09-25-2009   dr Al Corpus @[facility]    right great toe   CARPAL TUNNEL RELEASE Bilateral 1996;  1997   CESAREAN SECTION  1988   FOOT SURGERY  12/ 2014   dr Al Corpus   left 2nd hammertoe repair,  left 2nd metatarsal matrixectomy, left heel endoscopic plantar fasiotomy   IR CATHETER TUBE CHANGE  01/01/2019   IR RADIOLOGIST EVAL & MGMT  11/28/2018   IR RADIOLOGIST EVAL & MGMT  11/29/2018   IR RADIOLOGIST EVAL & MGMT  12/06/2018   IR RADIOLOGIST EVAL & MGMT  12/25/2018   IR RADIOLOGIST EVAL & MGMT  01/08/2019   IR RADIOLOGIST EVAL & MGMT  01/29/2019   PANNICULECTOMY N/A 10/26/2018   Procedure: PANNICULECTOMY;  Surgeon: Luretha Murphy, MD;  Location: [facility];  Service: General;  Laterality: N/A;   TENDON REPAIR   09/2015   left posterior tibial and peroneal tendon repairs   TOE AMPUTATION  01/2015   right 2nd toe   TONSILLECTOMY  age 63   TRANSOBTURATOR SLING  01-08-2004   dr Annabell Howells @[facility]    Patient Active Problem List   Diagnosis Date Noted   Degeneration of lumbar intervertebral disc 03/10/2022   Generalized psoriasis 03/10/2022   Other long term (current) drug therapy 03/10/2022   Overweight 03/10/2022   Pain in limb 03/10/2022   Primary osteoarthritis 03/10/2022   Synovial cyst 03/10/2022   S/P panniculectomy 10/26/2018   Panniculitis 09/04/2018   HYPOTHYROIDISM 12/01/2010   Type 2 diabetes mellitus with diabetic polyneuropathy, with long-term current use of insulin (HCC) 12/01/2010   DIAB W/NEURO MANIFESTS TYPE II/UNS NOT UNCNTRL 12/01/2010   OTHER AND UNSPECIFIED HYPERLIPIDEMIA 12/01/2010   OBESITY, UNSPECIFIED 12/01/2010   ESSENTIAL HYPERTENSION, BENIGN 12/01/2010   Rheumatoid arthritis (HCC) 12/01/2010    PCP: Johny Blamer MD   REFERRING PROVIDER:  Tia Alert, MD  REFERRING DIAG: M43.16 (ICD-10-CM) - Spondylolisthesis, lumbar region  Rationale for Evaluation and Treatment: Rehabilitation  THERAPY DIAG:  Other low back pain  Radiculopathy, lumbar region  Muscle weakness (generalized)  ONSET  DATE: chronic   SUBJECTIVE:                                                                                                                                                                                           SUBJECTIVE STATEMENT: Pt reports that dry needling helped, but her pain increased greatly the next day.  She is very painful and tender today.   PERTINENT HISTORY:  See above  Lumbar L4-L5 fusion, laminectomy Removal of cyst   PAIN:  Are you having pain? Yes: NPRS scale: 7/10 Pain location: back (low) Rt thigh  Pain description: throbbing, aching, stiffess , burning  Aggravating factors: standing, activity  Relieving factors: oxycodone, R  side, pillows     PRECAUTIONS: Fall                                    EXERCISE LOG  Exercise Repetitions and Resistance Comments  Nustep  Lvl 3 x 15 mins   [employer] X3 mins with 3 sec hold   [employer] X3 mins with 3 sec hold    Manual Therapy Soft Tissue Mobilization: lumbar, STW/M to bil lumbar paraspinals and SI joint region to decrease pain and tone    Modalities  Date:  Unattended Estim: Lumbar, IFC 80-150 Hz, 15 mins, Pain Hot Pack: Lumbar, 15 mins, Pain and Tone  PATIENT EDUCATION:  Education details:   Person educated:  International aid/development worker:  Education comprehension:  HOME EXERCISE PROGRAM:   ASSESSMENT:  CLINICAL IMPRESSION: Pt arrives for today's treatment session reporting 7/10 low back pain.  Pt reports that she felt relief after dry needling at last treatment, but was intense pain the following day.  Reviewed previously performed exercises with good result.  Notable tone and tenderness over right SI region.  STW/M performed to bil lumbar paraspinals and SI joint regions with concentration on right side.  Normal responses to estim and MH noted.  Pt wishes to attempt dry needling at next treatment session.  Pt reported decrease pain at completion of today's treatment session.  OBJECTIVE IMPAIRMENTS: Abnormal gait, decreased activity tolerance, decreased mobility, difficulty walking, decreased ROM, decreased strength, increased fascial restrictions, increased muscle spasms, impaired flexibility, impaired sensation, postural dysfunction, obesity, and pain.   ACTIVITY LIMITATIONS: carrying, lifting, bending, sitting, standing, squatting, sleeping, stairs, transfers, bed mobility, locomotion level, and caring for others  PARTICIPATION LIMITATIONS: meal prep, cleaning, laundry, medication management, interpersonal relationship, driving, shopping, community activity, and occupation  PERSONAL FACTORS: Time since onset of injury/illness/exacerbation and 3+ comorbidities: previous back  surgery, diabetes, obesity  are also affecting patient's functional outcome.   REHAB POTENTIAL: Good  CLINICAL DECISION MAKING: Evolving/moderate complexity  EVALUATION COMPLEXITY: Moderate   GOALS: Goals reviewed with patient? No     LONG TERM GOALS: Target date: 03/27/2023    Pt will be able to show Independence with HEP for core and LE Baseline: none Goal status: IN PROGRESS  2.  Pt will be able to perform 5 X STS in < 25 sec to demo improved functional mobility  Baseline: 41 sec  4/24: 27 seconds Goal status: IN PROGRESS  3.  Pt will be able to perform light home tasks and ADLs with no more than min increased pain in back and legs.  Baseline: unable, mom has to do  Goal status: IN PROGRESS  4.  FOTO score will improve to 47% to demo improved functional mobility  Baseline: 30% 4/24: 43% Goal status: IN PROGRESS  5.  Pt will be able to tolerate supine for short periods of time (20 min or more) in order to allow HEP and sleep  Baseline: unable , sleeps with multiple pillows  Goal status: IN PROGRESS  6.  Further goals TBA by treating PT  Baseline:  Goal status: IN PROGRESS  PLAN:  PT FREQUENCY: 2x/week  PT DURATION: 6 weeks  PLANNED INTERVENTIONS: Therapeutic exercises, Therapeutic activity, Neuromuscular re-education, Balance training, Gait training, Patient/Family education, Self Care, Joint mobilization, Dry Needling, Electrical stimulation, Spinal mobilization, Cryotherapy, Moist heat, Manual therapy, and Re-evaluation.  PLAN FOR NEXT SESSION: dry needling per MD.  Needs HEP and core, hip strength.  Prefers to sit, stand or S/L due to back pain .  Modalities as needed.    Newman Pies, PTA 03/16/2023, 4:15 PM

## 2023-03-20 ENCOUNTER — Other Ambulatory Visit (HOSPITAL_COMMUNITY): Payer: Self-pay

## 2023-03-20 ENCOUNTER — Ambulatory Visit: Payer: Commercial Managed Care - PPO

## 2023-03-20 ENCOUNTER — Other Ambulatory Visit: Payer: Self-pay | Admitting: Registered Nurse

## 2023-03-20 DIAGNOSIS — M5459 Other low back pain: Secondary | ICD-10-CM

## 2023-03-20 DIAGNOSIS — M5416 Radiculopathy, lumbar region: Secondary | ICD-10-CM | POA: Diagnosis not present

## 2023-03-20 DIAGNOSIS — M6281 Muscle weakness (generalized): Secondary | ICD-10-CM | POA: Diagnosis not present

## 2023-03-20 NOTE — Therapy (Signed)
OUTPATIENT PHYSICAL THERAPY THORACOLUMBAR TREATMENT   Patient Name: Amy Adams MRN: [id number] DOB:09/14/1966, 57 y.o., female Today's Date: 03/20/2023  END OF SESSION:  PT End of Session - 03/20/23 1443     Visit Number 8    Number of Visits 12    Date for PT Re-Evaluation 03/27/23    Authorization Type [facility] [employer]    PT Start Time 1430    PT Stop Time 1534    PT Time Calculation (min) 64 min    Activity Tolerance Patient tolerated treatment well    Behavior During Therapy WFL for tasks assessed/performed             Past Medical History:  Diagnosis Date   Anxiety    Arthritis    Chronic foot pain, left    GERD (gastroesophageal reflux disease)    History of anal fissures    Hyperlipidemia    Hypertension    Hypothyroidism    Insulin dependent type 2 diabetes mellitus (HCC)    endrocrinologist-- dr Talmage Nap   Migraines    Peripheral neuropathy    RA (rheumatoid arthritis) [facility])    rheumatologist-  dr Dierdre Forth   SUI (stress urinary incontinence, female)    Wears glasses    Past Surgical History:  Procedure Laterality Date   ABDOMINAL HYSTERECTOMY  1990   with Right Salpingo--ophorectomy   ANAL SPHINCTEROTOMY  01-05-2006    dr Zachery Dakins @[facility]    BUNIONECTOMY  09-25-2009   dr Al Corpus @[facility]    right great toe   CARPAL TUNNEL RELEASE Bilateral 1996;  1997   CESAREAN SECTION  1988   FOOT SURGERY  12/ 2014   dr Al Corpus   left 2nd hammertoe repair,  left 2nd metatarsal matrixectomy, left heel endoscopic plantar fasiotomy   IR CATHETER TUBE CHANGE  01/01/2019   IR RADIOLOGIST EVAL & MGMT  11/28/2018   IR RADIOLOGIST EVAL & MGMT  11/29/2018   IR RADIOLOGIST EVAL & MGMT  12/06/2018   IR RADIOLOGIST EVAL & MGMT  12/25/2018   IR RADIOLOGIST EVAL & MGMT  01/08/2019   IR RADIOLOGIST EVAL & MGMT  01/29/2019   PANNICULECTOMY N/A 10/26/2018   Procedure: PANNICULECTOMY;  Surgeon: Luretha Murphy, MD;  Location: [facility];  Service: General;  Laterality: N/A;   TENDON REPAIR   09/2015   left posterior tibial and peroneal tendon repairs   TOE AMPUTATION  01/2015   right 2nd toe   TONSILLECTOMY  age 62   TRANSOBTURATOR SLING  01-08-2004   dr Annabell Howells @[facility]    Patient Active Problem List   Diagnosis Date Noted   Degeneration of lumbar intervertebral disc 03/10/2022   Generalized psoriasis 03/10/2022   Other long term (current) drug therapy 03/10/2022   Overweight 03/10/2022   Pain in limb 03/10/2022   Primary osteoarthritis 03/10/2022   Synovial cyst 03/10/2022   S/P panniculectomy 10/26/2018   Panniculitis 09/04/2018   HYPOTHYROIDISM 12/01/2010   Type 2 diabetes mellitus with diabetic polyneuropathy, with long-term current use of insulin (HCC) 12/01/2010   DIAB W/NEURO MANIFESTS TYPE II/UNS NOT UNCNTRL 12/01/2010   OTHER AND UNSPECIFIED HYPERLIPIDEMIA 12/01/2010   OBESITY, UNSPECIFIED 12/01/2010   ESSENTIAL HYPERTENSION, BENIGN 12/01/2010   Rheumatoid arthritis (HCC) 12/01/2010    PCP: Johny Blamer MD   REFERRING PROVIDER:  Tia Alert, MD  REFERRING DIAG: M43.16 (ICD-10-CM) - Spondylolisthesis, lumbar region  Rationale for Evaluation and Treatment: Rehabilitation  THERAPY DIAG:  Other low back pain  Radiculopathy, lumbar region  Muscle weakness (generalized)  ONSET  DATE: chronic   SUBJECTIVE:                                                                                                                                                                                           SUBJECTIVE STATEMENT: Pt reports increased bil SI region pain today.  PERTINENT HISTORY:  See above  Lumbar L4-L5 fusion, laminectomy Removal of cyst   PAIN:  Are you having pain? Yes: NPRS scale: 7/10 Pain location: bil SI joint pain Pain description: throbbing, aching, stiffess , burning  Aggravating factors: standing, activity  Relieving factors: oxycodone, R  side, pillows    PRECAUTIONS: Fall                                    EXERCISE  LOG  Exercise Repetitions and Resistance Comments  Nustep  Lvl 3 x 10 mins   [employer]     Manual Therapy Soft Tissue Mobilization: lumbar, STW/M to bil lumbar paraspinals and SI joint region to decrease pain and tone    Modalities  Date:  Unattended Estim: Lumbar, IFC 80-150 Hz, 15 mins, Pain Hot Pack: Lumbar, 15 mins, Pain and Tone  PATIENT EDUCATION:  Education details:   Person educated:  International aid/development worker:  Education comprehension:  HOME EXERCISE PROGRAM:   ASSESSMENT:  CLINICAL IMPRESSION: Pt arrives for today's treatment session reporting 7/10 bil SI joint pain.  Pt with several palpable trigger points at bil SI region.  Pt with tenderness to touch with mos trigger points.  STW/M and trigger point release performed to bil SI regions to decrease pain and tone with limited benefit.  Pt states that she wishes to attempt dry needling at least on one more occasion.  Normal responses to estim and MH noted upon removal.  Pt with minimal pain relief at completion of today's treatment session.  OBJECTIVE IMPAIRMENTS: Abnormal gait, decreased activity tolerance, decreased mobility, difficulty walking, decreased ROM, decreased strength, increased fascial restrictions, increased muscle spasms, impaired flexibility, impaired sensation, postural dysfunction, obesity, and pain.   ACTIVITY LIMITATIONS: carrying, lifting, bending, sitting, standing, squatting, sleeping, stairs, transfers, bed mobility, locomotion level, and caring for others  PARTICIPATION LIMITATIONS: meal prep, cleaning, laundry, medication management, interpersonal relationship, driving, shopping, community activity, and occupation  PERSONAL FACTORS: Time since onset of injury/illness/exacerbation and 3+ comorbidities: previous back surgery, diabetes, obesity   are also affecting patient's functional outcome.   REHAB POTENTIAL: Good  CLINICAL DECISION MAKING: Evolving/moderate complexity  EVALUATION  COMPLEXITY: Moderate   GOALS: Goals reviewed with patient? No  LONG TERM GOALS: Target date: 03/27/2023    Pt will be able to show Independence with HEP for core and LE Baseline: none Goal status: IN PROGRESS  2.  Pt will be able to perform 5 X STS in < 25 sec to demo improved functional mobility  Baseline: 41 sec  4/24: 27 seconds Goal status: IN PROGRESS  3.  Pt will be able to perform light home tasks and ADLs with no more than min increased pain in back and legs.  Baseline: unable, mom has to do  Goal status: IN PROGRESS  4.  FOTO score will improve to 47% to demo improved functional mobility  Baseline: 30% 4/24: 43% Goal status: IN PROGRESS  5.  Pt will be able to tolerate supine for short periods of time (20 min or more) in order to allow HEP and sleep  Baseline: unable , sleeps with multiple pillows  Goal status: IN PROGRESS  6.  Further goals TBA by treating PT  Baseline:  Goal status: IN PROGRESS  PLAN:  PT FREQUENCY: 2x/week  PT DURATION: 6 weeks  PLANNED INTERVENTIONS: Therapeutic exercises, Therapeutic activity, Neuromuscular re-education, Balance training, Gait training, Patient/Family education, Self Care, Joint mobilization, Dry Needling, Electrical stimulation, Spinal mobilization, Cryotherapy, Moist heat, Manual therapy, and Re-evaluation.  PLAN FOR NEXT SESSION: dry needling per MD.  Needs HEP and core, hip strength.  Prefers to sit, stand or S/L due to back pain .  Modalities as needed.    Newman Pies, PTA 03/20/2023, 3:46 PM

## 2023-03-21 ENCOUNTER — Other Ambulatory Visit (HOSPITAL_COMMUNITY): Payer: Self-pay

## 2023-03-21 ENCOUNTER — Other Ambulatory Visit: Payer: Self-pay

## 2023-03-21 MED ORDER — RYBELSUS 3 MG PO TABS
3.0000 mg | ORAL_TABLET | Freq: Every day | ORAL | 11 refills | Status: DC
  Filled 2023-03-21: qty 30, 30d supply, fill #0

## 2023-03-21 MED ORDER — OXYCODONE-ACETAMINOPHEN 10-325 MG PO TABS
1.0000 | ORAL_TABLET | Freq: Four times a day (QID) | ORAL | 0 refills | Status: DC | PRN
  Filled 2023-03-21: qty 55, 7d supply, fill #0

## 2023-03-22 NOTE — Telephone Encounter (Signed)
PMP was Reviewed.  She received Oxycodone on 03/09/23 by Dr Yetta Barre and Buprenorphine Patch from Dr Yetta Barre on 02/22/2023.  Call placed to Ms. Paez, no answer left message to return the call.  Last Tramadol was filled on 08/26/2022. Awaiting a return call regarding the above.

## 2023-03-23 ENCOUNTER — Other Ambulatory Visit (HOSPITAL_COMMUNITY): Payer: Self-pay

## 2023-03-23 ENCOUNTER — Other Ambulatory Visit: Payer: Self-pay

## 2023-03-23 MED ORDER — RYBELSUS 7 MG PO TABS
7.0000 mg | ORAL_TABLET | Freq: Every day | ORAL | 11 refills | Status: DC
  Filled 2023-03-23 – 2023-03-30 (×2): qty 30, 30d supply, fill #0
  Filled 2023-07-21: qty 30, 30d supply, fill #1

## 2023-03-27 ENCOUNTER — Other Ambulatory Visit (HOSPITAL_COMMUNITY): Payer: Self-pay

## 2023-03-28 ENCOUNTER — Ambulatory Visit: Payer: Commercial Managed Care - PPO

## 2023-03-28 DIAGNOSIS — M5416 Radiculopathy, lumbar region: Secondary | ICD-10-CM

## 2023-03-28 DIAGNOSIS — M5459 Other low back pain: Secondary | ICD-10-CM | POA: Diagnosis not present

## 2023-03-28 DIAGNOSIS — M6281 Muscle weakness (generalized): Secondary | ICD-10-CM | POA: Diagnosis not present

## 2023-03-28 NOTE — Therapy (Addendum)
OUTPATIENT PHYSICAL THERAPY THORACOLUMBAR TREATMENT   Patient Name: Amy Adams MRN: [id number] DOB:1966/05/18, 57 y.o., female Today's Date: 03/28/2023  END OF SESSION:  PT End of Session - 03/28/23 1123     Visit Number 9    Number of Visits 12    Date for PT Re-Evaluation 03/27/23    Authorization Type [facility] Employee [employer]    PT Start Time 1115    PT Stop Time 1215    PT Time Calculation (min) 60 min    Activity Tolerance Patient tolerated treatment well    Behavior During Therapy WFL for tasks assessed/performed             Past Medical History:  Diagnosis Date   Anxiety    Arthritis    Chronic foot pain, left    GERD (gastroesophageal reflux disease)    History of anal fissures    Hyperlipidemia    Hypertension    Hypothyroidism    Insulin dependent type 2 diabetes mellitus (HCC)    endrocrinologist-- dr Talmage Nap   Migraines    Peripheral neuropathy    RA (rheumatoid arthritis) [facility])    rheumatologist-  dr Dierdre Forth   SUI (stress urinary incontinence, female)    Wears glasses    Past Surgical History:  Procedure Laterality Date   ABDOMINAL HYSTERECTOMY  1990   with Right Salpingo--ophorectomy   ANAL SPHINCTEROTOMY  01-05-2006    dr Zachery Dakins @[facility]    BUNIONECTOMY  09-25-2009   dr Al Corpus @[facility]    right great toe   CARPAL TUNNEL RELEASE Bilateral 1996;  1997   CESAREAN SECTION  1988   FOOT SURGERY  12/ 2014   dr Al Corpus   left 2nd hammertoe repair,  left 2nd metatarsal matrixectomy, left heel endoscopic plantar fasiotomy   IR CATHETER TUBE CHANGE  01/01/2019   IR RADIOLOGIST EVAL & MGMT  11/28/2018   IR RADIOLOGIST EVAL & MGMT  11/29/2018   IR RADIOLOGIST EVAL & MGMT  12/06/2018   IR RADIOLOGIST EVAL & MGMT  12/25/2018   IR RADIOLOGIST EVAL & MGMT  01/08/2019   IR RADIOLOGIST EVAL & MGMT  01/29/2019   PANNICULECTOMY N/A 10/26/2018   Procedure: PANNICULECTOMY;  Surgeon: Luretha Murphy, MD;  Location: [facility];  Service: General;  Laterality: N/A;   TENDON REPAIR   09/2015   left posterior tibial and peroneal tendon repairs   TOE AMPUTATION  01/2015   right 2nd toe   TONSILLECTOMY  age 71   TRANSOBTURATOR SLING  01-08-2004   dr Annabell Howells @[facility]    Patient Active Problem List   Diagnosis Date Noted   Degeneration of lumbar intervertebral disc 03/10/2022   Generalized psoriasis 03/10/2022   Other long term (current) drug therapy 03/10/2022   Overweight 03/10/2022   Pain in limb 03/10/2022   Primary osteoarthritis 03/10/2022   Synovial cyst 03/10/2022   S/P panniculectomy 10/26/2018   Panniculitis 09/04/2018   HYPOTHYROIDISM 12/01/2010   Type 2 diabetes mellitus with diabetic polyneuropathy, with long-term current use of insulin (HCC) 12/01/2010   DIAB W/NEURO MANIFESTS TYPE II/UNS NOT UNCNTRL 12/01/2010   OTHER AND UNSPECIFIED HYPERLIPIDEMIA 12/01/2010   OBESITY, UNSPECIFIED 12/01/2010   ESSENTIAL HYPERTENSION, BENIGN 12/01/2010   Rheumatoid arthritis (HCC) 12/01/2010    PCP: Johny Blamer MD   REFERRING PROVIDER:  Tia Alert, MD  REFERRING DIAG: M43.16 (ICD-10-CM) - Spondylolisthesis, lumbar region  Rationale for Evaluation and Treatment: Rehabilitation  THERAPY DIAG:  Other low back pain  Radiculopathy, lumbar region  Muscle weakness (generalized)  ONSET  DATE: chronic   SUBJECTIVE:                                                                                                                                                                                           SUBJECTIVE STATEMENT: Pt reports 5/10 bil SI joint pain.  PERTINENT HISTORY:  See above  Lumbar L4-L5 fusion, laminectomy Removal of cyst   PAIN:  Are you having pain? Yes: NPRS scale: 5/10 Pain location: bil SI joint pain Pain description: throbbing, aching, stiffess , burning  Aggravating factors: standing, activity  Relieving factors: oxycodone, R  side, pillows    PRECAUTIONS: Fall                                    EXERCISE LOG  Exercise  Repetitions and Resistance Comments  Nustep  Lvl 3 x 15 mins   [employer]     Manual Therapy Soft Tissue Mobilization: lumbar, STW/M to bil lumbar paraspinals and SI joint region to decrease pain and tone    Modalities  Date:  Unattended Estim: Lumbar, IFC 80-150 Hz, 15 mins, Pain Hot Pack: Lumbar, 15 mins, Pain and Tone  PATIENT EDUCATION:  Education details:   Person educated:  International aid/development worker:  Education comprehension:  HOME EXERCISE PROGRAM:   ASSESSMENT:  CLINICAL IMPRESSION: Pt arrives for today's treatment session reporting 5/10 bil SI region pain.  Dry needling performed by supervising PT followed STW/M to bil SI regions to decrease pain and tone.  Supervising PT, patient, and this PTA discussed possible benefits for SI joint steroid injections and use of SI belt.  Normal responses to estim and vaso noted upon removal.  Pt reported 5/10 bil SI pain at completion of today's treatment session.  OBJECTIVE IMPAIRMENTS: Abnormal gait, decreased activity tolerance, decreased mobility, difficulty walking, decreased ROM, decreased strength, increased fascial restrictions, increased muscle spasms, impaired flexibility, impaired sensation, postural dysfunction, obesity, and pain.   ACTIVITY LIMITATIONS: carrying, lifting, bending, sitting, standing, squatting, sleeping, stairs, transfers, bed mobility, locomotion level, and caring for others  PARTICIPATION LIMITATIONS: meal prep, cleaning, laundry, medication management, interpersonal relationship, driving, shopping, community activity, and occupation  PERSONAL FACTORS: Time since onset of injury/illness/exacerbation and 3+ comorbidities: previous back surgery, diabetes, obesity   are also affecting patient's functional outcome.   REHAB POTENTIAL: Good  CLINICAL DECISION MAKING: Evolving/moderate complexity  EVALUATION COMPLEXITY: Moderate   GOALS: Goals reviewed with patient? No     LONG TERM GOALS:  Target date: 03/27/2023    Pt will be able to  show Independence with HEP for core and LE Baseline: none Goal status: IN PROGRESS  2.  Pt will be able to perform 5 X STS in < 25 sec to demo improved functional mobility  Baseline: 41 sec  4/24: 27 seconds Goal status: IN PROGRESS  3.  Pt will be able to perform light home tasks and ADLs with no more than min increased pain in back and legs.  Baseline: unable, mom has to do  Goal status: IN PROGRESS  4.  FOTO score will improve to 47% to demo improved functional mobility  Baseline: 30% 4/24: 43% Goal status: IN PROGRESS  5.  Pt will be able to tolerate supine for short periods of time (20 min or more) in order to allow HEP and sleep  Baseline: unable , sleeps with multiple pillows  Goal status: IN PROGRESS  6.  Further goals TBA by treating PT  Baseline:  Goal status: IN PROGRESS  PLAN:  PT FREQUENCY: 2x/week  PT DURATION: 6 weeks  PLANNED INTERVENTIONS: Therapeutic exercises, Therapeutic activity, Neuromuscular re-education, Balance training, Gait training, Patient/Family education, Self Care, Joint mobilization, Dry Needling, Electrical stimulation, Spinal mobilization, Cryotherapy, Moist heat, Manual therapy, and Re-evaluation.  PLAN FOR NEXT SESSION: dry needling per MD.  Needs HEP and core, hip strength.  Prefers to sit, stand or S/L due to back pain .  Modalities as needed.   Patient c/o pain over bilateral upper gluteal regions.  DN performed to this region.    [place] App[place] MPT  Newman Pies, PTA 03/28/2023, 12:16 PM

## 2023-03-29 ENCOUNTER — Other Ambulatory Visit (HOSPITAL_COMMUNITY): Payer: Self-pay

## 2023-03-30 ENCOUNTER — Other Ambulatory Visit (HOSPITAL_COMMUNITY): Payer: Self-pay

## 2023-03-30 DIAGNOSIS — M461 Sacroiliitis, not elsewhere classified: Secondary | ICD-10-CM | POA: Diagnosis not present

## 2023-04-04 ENCOUNTER — Ambulatory Visit: Payer: Commercial Managed Care - PPO

## 2023-04-04 DIAGNOSIS — M6281 Muscle weakness (generalized): Secondary | ICD-10-CM | POA: Diagnosis not present

## 2023-04-04 DIAGNOSIS — M5416 Radiculopathy, lumbar region: Secondary | ICD-10-CM

## 2023-04-04 DIAGNOSIS — M5459 Other low back pain: Secondary | ICD-10-CM | POA: Diagnosis not present

## 2023-04-04 NOTE — Therapy (Signed)
OUTPATIENT PHYSICAL THERAPY THORACOLUMBAR TREATMENT   Patient Name: Amy Adams MRN: [id number] DOB:January 11, 1966, 57 y.o., female Today's Date: 04/04/2023  END OF SESSION:  PT End of Session - 04/04/23 1124     Visit Number 10    Number of Visits 12    Date for PT Re-Evaluation 03/27/23    Authorization Type [facility] [employer]    PT Start Time 1115    PT Stop Time 1220    PT Time Calculation (min) 65 min    Activity Tolerance Patient tolerated treatment well    Behavior During Therapy WFL for tasks assessed/performed             Past Medical History:  Diagnosis Date   Anxiety    Arthritis    Chronic foot pain, left    GERD (gastroesophageal reflux disease)    History of anal fissures    Hyperlipidemia    Hypertension    Hypothyroidism    Insulin dependent type 2 diabetes mellitus (HCC)    endrocrinologist-- dr Talmage Nap   Migraines    Peripheral neuropathy    RA (rheumatoid arthritis) [facility])    rheumatologist-  dr Dierdre Forth   SUI (stress urinary incontinence, female)    Wears glasses    Past Surgical History:  Procedure Laterality Date   ABDOMINAL HYSTERECTOMY  1990   with Right Salpingo--ophorectomy   ANAL SPHINCTEROTOMY  01-05-2006    dr Zachery Dakins @[facility]    BUNIONECTOMY  09-25-2009   dr Al Corpus @[facility]    right great toe   CARPAL TUNNEL RELEASE Bilateral 1996;  1997   CESAREAN SECTION  1988   FOOT SURGERY  12/ 2014   dr Al Corpus   left 2nd hammertoe repair,  left 2nd metatarsal matrixectomy, left heel endoscopic plantar fasiotomy   IR CATHETER TUBE CHANGE  01/01/2019   IR RADIOLOGIST EVAL & MGMT  11/28/2018   IR RADIOLOGIST EVAL & MGMT  11/29/2018   IR RADIOLOGIST EVAL & MGMT  12/06/2018   IR RADIOLOGIST EVAL & MGMT  12/25/2018   IR RADIOLOGIST EVAL & MGMT  01/08/2019   IR RADIOLOGIST EVAL & MGMT  01/29/2019   PANNICULECTOMY N/A 10/26/2018   Procedure: PANNICULECTOMY;  Surgeon: Luretha Murphy, MD;  Location: [facility];  Service: General;  Laterality: N/A;   TENDON REPAIR   09/2015   left posterior tibial and peroneal tendon repairs   TOE AMPUTATION  01/2015   right 2nd toe   TONSILLECTOMY  age 71   TRANSOBTURATOR SLING  01-08-2004   dr Annabell Howells @[facility]    Patient Active Problem List   Diagnosis Date Noted   Degeneration of lumbar intervertebral disc 03/10/2022   Generalized psoriasis 03/10/2022   Other long term (current) drug therapy 03/10/2022   Overweight 03/10/2022   Pain in limb 03/10/2022   Primary osteoarthritis 03/10/2022   Synovial cyst 03/10/2022   S/P panniculectomy 10/26/2018   Panniculitis 09/04/2018   HYPOTHYROIDISM 12/01/2010   Type 2 diabetes mellitus with diabetic polyneuropathy, with long-term current use of insulin (HCC) 12/01/2010   DIAB W/NEURO MANIFESTS TYPE II/UNS NOT UNCNTRL 12/01/2010   OTHER AND UNSPECIFIED HYPERLIPIDEMIA 12/01/2010   OBESITY, UNSPECIFIED 12/01/2010   ESSENTIAL HYPERTENSION, BENIGN 12/01/2010   Rheumatoid arthritis (HCC) 12/01/2010    PCP: Johny Blamer MD   REFERRING PROVIDER:  Tia Alert, MD  REFERRING DIAG: M43.16 (ICD-10-CM) - Spondylolisthesis, lumbar region  Rationale for Evaluation and Treatment: Rehabilitation  THERAPY DIAG:  Other low back pain  Radiculopathy, lumbar region  Muscle weakness (generalized)  ONSET  DATE: chronic   SUBJECTIVE:                                                                                                                                                                                           SUBJECTIVE STATEMENT: Pt reports 5/10 bil SI joint pain.  PERTINENT HISTORY:  See above  Lumbar L4-L5 fusion, laminectomy Removal of cyst   PAIN:  Are you having pain? Yes: NPRS scale: 5/10 Pain location: bil SI joint pain Pain description: throbbing, aching, stiffess , burning  Aggravating factors: standing, activity  Relieving factors: oxycodone, R  side, pillows    PRECAUTIONS: Fall                                    EXERCISE LOG  Exercise  Repetitions and Resistance Comments  Nustep  Lvl 3 x 15 mins   Hip Hike X10 reps bil   Lateral Step Down 4" X15 reps bil   HS Stretch 3 reps x 30 secs   Hip Adductor Stretch 3 reps x 30 secs   Hip Abduction Isometric 10 reps x 10 sec hold    Manual Therapy Soft Tissue Mobilization: lumbar, STW/M to bil lumbar paraspinals and SI joint region to decrease pain and tone    Modalities  Date:  Unattended Estim: Lumbar, IFC 80-150 Hz, 15 mins, Pain Hot Pack: Lumbar, 15 mins, Pain and Tone  PATIENT EDUCATION:  Education details:   Person educated:  International aid/development worker:  Education comprehension:  HOME EXERCISE PROGRAM:   ASSESSMENT:  CLINICAL IMPRESSION: Pt arrives for today's treatment session reporting 4/10 low back pain.  Pt instructed in several standing hip and low back exercises to increase strength and function while decreasing pain.  Pt experienced some increase in pain during bil hip hikes, but able to complete all reps asked of her.  Pt encouraged to perform all newly exercises at home daily to assist with SI region pain.  Pt utilized facility's SI belt today to confirm benefit prior to purchasing a personal belt.  Pt reported decreased SI pain while wearing belt.  Normal responses to estim and MH noted upon removal.  Pt reported 3/10 low back and SI joint pain at completion of today's treatment session.  OBJECTIVE IMPAIRMENTS: Abnormal gait, decreased activity tolerance, decreased mobility, difficulty walking, decreased ROM, decreased strength, increased fascial restrictions, increased muscle spasms, impaired flexibility, impaired sensation, postural dysfunction, obesity, and pain.   ACTIVITY LIMITATIONS: carrying, lifting, bending, sitting, standing, squatting, sleeping, stairs, transfers, bed mobility, locomotion level, and caring for others  PARTICIPATION  LIMITATIONS: meal prep, cleaning, laundry, medication management, interpersonal relationship, driving, shopping, community  activity, and occupation  PERSONAL FACTORS: Time since onset of injury/illness/exacerbation and 3+ comorbidities: previous back surgery, diabetes, obesity   are also affecting patient's functional outcome.   REHAB POTENTIAL: Good  CLINICAL DECISION MAKING: Evolving/moderate complexity  EVALUATION COMPLEXITY: Moderate   GOALS: Goals reviewed with patient? No     LONG TERM GOALS: Target date: 03/27/2023    Pt will be able to show Independence with HEP for core and LE Baseline: none Goal status: IN PROGRESS  2.  Pt will be able to perform 5 X STS in < 25 sec to demo improved functional mobility  Baseline: 41 sec  4/24: 27 seconds Goal status: IN PROGRESS  3.  Pt will be able to perform light home tasks and ADLs with no more than min increased pain in back and legs.  Baseline: unable, mom has to do  Goal status: IN PROGRESS  4.  FOTO score will improve to 47% to demo improved functional mobility  Baseline: 30% 4/24: 43% Goal status: IN PROGRESS  5.  Pt will be able to tolerate supine for short periods of time (20 min or more) in order to allow HEP and sleep  Baseline: unable , sleeps with multiple pillows  Goal status: IN PROGRESS  6.  Further goals TBA by treating PT  Baseline:  Goal status: IN PROGRESS  PLAN:  PT FREQUENCY: 2x/week  PT DURATION: 6 weeks  PLANNED INTERVENTIONS: Therapeutic exercises, Therapeutic activity, Neuromuscular re-education, Balance training, Gait training, Patient/Family education, Self Care, Joint mobilization, Dry Needling, Electrical stimulation, Spinal mobilization, Cryotherapy, Moist heat, Manual therapy, and Re-evaluation.  PLAN FOR NEXT SESSION: dry needling per MD.  Needs HEP and core, hip strength.  Prefers to sit, stand or S/L due to back pain .  Modalities as needed.    Newman Pies, PTA 04/04/2023, 12:27 PM

## 2023-04-11 ENCOUNTER — Ambulatory Visit: Payer: Commercial Managed Care - PPO

## 2023-04-11 DIAGNOSIS — M5416 Radiculopathy, lumbar region: Secondary | ICD-10-CM

## 2023-04-11 DIAGNOSIS — M6281 Muscle weakness (generalized): Secondary | ICD-10-CM | POA: Diagnosis not present

## 2023-04-11 DIAGNOSIS — M5459 Other low back pain: Secondary | ICD-10-CM

## 2023-04-11 NOTE — Therapy (Signed)
OUTPATIENT PHYSICAL THERAPY THORACOLUMBAR TREATMENT   Patient Name: Amy Adams MRN: [id number] DOB:[birth year], 57 y.o., female Today's Date: 04/11/2023  END OF SESSION:  PT End of Session - 04/11/23 1123     Visit Number 11    Number of Visits 12    Date for PT Re-Evaluation 03/27/23    Authorization Type [facility] [employer]    PT Start Time 1115    PT Stop Time 1219    PT Time Calculation (min) 64 min    Activity Tolerance Patient tolerated treatment well    Behavior During Therapy WFL for tasks assessed/performed             Past Medical History:  Diagnosis Date   Anxiety    Arthritis    Chronic foot pain, left    GERD (gastroesophageal reflux disease)    History of anal fissures    Hyperlipidemia    Hypertension    Hypothyroidism    Insulin dependent type 2 diabetes mellitus (HCC)    endrocrinologist-- dr Talmage Nap   Migraines    Peripheral neuropathy    RA (rheumatoid arthritis) [facility])    rheumatologist-  dr Dierdre Forth   SUI (stress urinary incontinence, female)    Wears glasses    Past Surgical History:  Procedure Laterality Date   ABDOMINAL HYSTERECTOMY  1990   with Right Salpingo--ophorectomy   ANAL SPHINCTEROTOMY  01-05-2006    dr Zachery Dakins @[facility]    BUNIONECTOMY  09-25-2009   dr Al Corpus @[facility]    right great toe   CARPAL TUNNEL RELEASE Bilateral 1996;  1997   CESAREAN SECTION  1988   FOOT SURGERY  12/ 2014   dr Al Corpus   left 2nd hammertoe repair,  left 2nd metatarsal matrixectomy, left heel endoscopic plantar fasiotomy   IR CATHETER TUBE CHANGE  01/01/2019   IR RADIOLOGIST EVAL & MGMT  11/28/2018   IR RADIOLOGIST EVAL & MGMT  11/29/2018   IR RADIOLOGIST EVAL & MGMT  12/06/2018   IR RADIOLOGIST EVAL & MGMT  12/25/2018   IR RADIOLOGIST EVAL & MGMT  01/08/2019   IR RADIOLOGIST EVAL & MGMT  01/29/2019   PANNICULECTOMY N/A 10/26/2018   Procedure: PANNICULECTOMY;  Surgeon: Luretha Murphy, MD;  Location: [facility];  Service: General;  Laterality: N/A;   TENDON REPAIR   09/2015   left posterior tibial and peroneal tendon repairs   TOE AMPUTATION  01/2015   right 2nd toe   TONSILLECTOMY  age 45   TRANSOBTURATOR SLING  01-08-2004   dr Annabell Howells @[facility]    Patient Active Problem List   Diagnosis Date Noted   Degeneration of lumbar intervertebral disc 03/10/2022   Generalized psoriasis 03/10/2022   Other long term (current) drug therapy 03/10/2022   Overweight 03/10/2022   Pain in limb 03/10/2022   Primary osteoarthritis 03/10/2022   Synovial cyst 03/10/2022   S/P panniculectomy 10/26/2018   Panniculitis 09/04/2018   HYPOTHYROIDISM 12/01/2010   Type 2 diabetes mellitus with diabetic polyneuropathy, with long-term current use of insulin (HCC) 12/01/2010   DIAB W/NEURO MANIFESTS TYPE II/UNS NOT UNCNTRL 12/01/2010   OTHER AND UNSPECIFIED HYPERLIPIDEMIA 12/01/2010   OBESITY, UNSPECIFIED 12/01/2010   ESSENTIAL HYPERTENSION, BENIGN 12/01/2010   Rheumatoid arthritis (HCC) 12/01/2010    PCP: Johny Blamer MD   REFERRING PROVIDER:  Tia Alert, MD  REFERRING DIAG: M43.16 (ICD-10-CM) - Spondylolisthesis, lumbar region  Rationale for Evaluation and Treatment: Rehabilitation  THERAPY DIAG:  Other low back pain  Radiculopathy, lumbar region  Muscle weakness (generalized)  ONSET  DATE: chronic   SUBJECTIVE:                                                                                                                                                                                           SUBJECTIVE STATEMENT: Pt reports 4/10 bil SI joint pain. L>R.  Pt to have steroid injection on Thursday.  PERTINENT HISTORY:  See above  Lumbar L4-L5 fusion, laminectomy Removal of cyst   PAIN:  Are you having pain? Yes: NPRS scale: 4/10 Pain location: bil SI joint pain Pain description: throbbing, aching, stiffess , burning  Aggravating factors: standing, activity  Relieving factors: oxycodone, R  side, pillows    PRECAUTIONS: Fall                                     EXERCISE LOG  Exercise Repetitions and Resistance Comments  Nustep  Lvl 3 x 16 mins   Hip Hike X10 reps bil   Lateral Step Down 4" X20 reps bil   Standing Hip Abduction X20 reps bil   Rockerboard  X   HS Stretch    Hip Adductor Stretch    Hip Abduction Isometric     Manual Therapy Soft Tissue Mobilization: lumbar, STW/M to bil lumbar paraspinals and SI joint region to decrease pain and tone   Manual distraction to RLE in left side-lying and LLE in right side-lying  Modalities  Date:  Unattended Estim: Lumbar, IFC 80-150 Hz, 15 mins, Pain Hot Pack: Lumbar, 15 mins, Pain and Tone  PATIENT EDUCATION:  Education details:   Person educated:  International aid/development worker:  Education comprehension:  HOME EXERCISE PROGRAM:   ASSESSMENT:  CLINICAL IMPRESSION: Pt arrives for today's treatment session reporting 4/10 bil SI region pain with L>R.  Pt reports performing exercises at home as previously instructed with good results.  Manual distraction performed of BLEs with pt in side-lying on opposite side.  Leg length discrepancy test performed and unremarkable.  STW/M performed to bil SI joint regions with concentration on left side today.  Normal responses to estim and MH noted upon removal.  Pt reported 3/10 SI region pain at completion of today's treatment session.   OBJECTIVE IMPAIRMENTS: Abnormal gait, decreased activity tolerance, decreased mobility, difficulty walking, decreased ROM, decreased strength, increased fascial restrictions, increased muscle spasms, impaired flexibility, impaired sensation, postural dysfunction, obesity, and pain.   ACTIVITY LIMITATIONS: carrying, lifting, bending, sitting, standing, squatting, sleeping, stairs, transfers, bed mobility, locomotion level, and caring for others  PARTICIPATION LIMITATIONS: meal prep, cleaning, laundry, medication management, interpersonal relationship, driving,  shopping, community activity, and occupation  PERSONAL  FACTORS: Time since onset of injury/illness/exacerbation and 3+ comorbidities: previous back surgery, diabetes, obesity   are also affecting patient's functional outcome.   REHAB POTENTIAL: Good  CLINICAL DECISION MAKING: Evolving/moderate complexity  EVALUATION COMPLEXITY: Moderate   GOALS: Goals reviewed with patient? No     LONG TERM GOALS: Target date: 03/27/2023    Pt will be able to show Independence with HEP for core and LE Baseline: none Goal status: IN PROGRESS  2.  Pt will be able to perform 5 X STS in < 25 sec to demo improved functional mobility  Baseline: 41 sec  4/24: 27 seconds Goal status: IN PROGRESS  3.  Pt will be able to perform light home tasks and ADLs with no more than min increased pain in back and legs.  Baseline: unable, mom has to do  Goal status: IN PROGRESS  4.  FOTO score will improve to 47% to demo improved functional mobility  Baseline: 30% 4/24: 43% Goal status: IN PROGRESS  5.  Pt will be able to tolerate supine for short periods of time (20 min or more) in order to allow HEP and sleep  Baseline: unable , sleeps with multiple pillows  Goal status: IN PROGRESS  6.  Further goals TBA by treating PT  Baseline:  Goal status: IN PROGRESS  PLAN:  PT FREQUENCY: 2x/week  PT DURATION: 6 weeks  PLANNED INTERVENTIONS: Therapeutic exercises, Therapeutic activity, Neuromuscular re-education, Balance training, Gait training, Patient/Family education, Self Care, Joint mobilization, Dry Needling, Electrical stimulation, Spinal mobilization, Cryotherapy, Moist heat, Manual therapy, and Re-evaluation.  PLAN FOR NEXT SESSION: dry needling per MD.  Needs HEP and core, hip strength.  Prefers to sit, stand or S/L due to back pain .  Modalities as needed.    Newman Pies, PTA 04/11/2023, 12:22 PM

## 2023-04-13 ENCOUNTER — Other Ambulatory Visit (HOSPITAL_COMMUNITY): Payer: Self-pay

## 2023-04-13 DIAGNOSIS — M461 Sacroiliitis, not elsewhere classified: Secondary | ICD-10-CM | POA: Diagnosis not present

## 2023-04-13 MED ORDER — OXYCODONE-ACETAMINOPHEN 10-325 MG PO TABS
1.0000 | ORAL_TABLET | Freq: Four times a day (QID) | ORAL | 0 refills | Status: DC | PRN
  Filled 2023-04-13: qty 55, 7d supply, fill #0

## 2023-04-14 ENCOUNTER — Other Ambulatory Visit: Payer: Self-pay

## 2023-04-14 MED ORDER — RYBELSUS 14 MG PO TABS
14.0000 mg | ORAL_TABLET | ORAL | 3 refills | Status: DC
  Filled 2023-04-14 – 2023-04-19 (×2): qty 30, 30d supply, fill #0
  Filled 2023-05-24: qty 30, 30d supply, fill #1
  Filled 2023-06-12 – 2023-07-23 (×5): qty 30, 30d supply, fill #2

## 2023-04-17 ENCOUNTER — Ambulatory Visit: Payer: Commercial Managed Care - PPO | Attending: Neurology

## 2023-04-17 DIAGNOSIS — M6281 Muscle weakness (generalized): Secondary | ICD-10-CM | POA: Insufficient documentation

## 2023-04-17 DIAGNOSIS — M5459 Other low back pain: Secondary | ICD-10-CM | POA: Insufficient documentation

## 2023-04-17 DIAGNOSIS — M5416 Radiculopathy, lumbar region: Secondary | ICD-10-CM | POA: Diagnosis not present

## 2023-04-17 NOTE — Therapy (Signed)
OUTPATIENT PHYSICAL THERAPY THORACOLUMBAR TREATMENT   Patient Name: Amy Adams MRN: [id number] DOB:1966-09-22, 57 y.o., female Today's Date: 04/17/2023  END OF SESSION:  PT End of Session - 04/17/23 1106     Visit Number 12    Number of Visits 12    Date for PT Re-Evaluation 03/27/23    Authorization Type [facility] [employer]    PT Start Time 1100    PT Stop Time 1158    PT Time Calculation (min) 58 min    Activity Tolerance Patient tolerated treatment well    Behavior During Therapy WFL for tasks assessed/performed             Past Medical History:  Diagnosis Date   Anxiety    Arthritis    Chronic foot pain, left    GERD (gastroesophageal reflux disease)    History of anal fissures    Hyperlipidemia    Hypertension    Hypothyroidism    Insulin dependent type 2 diabetes mellitus (HCC)    endrocrinologist-- dr Talmage Nap   Migraines    Peripheral neuropathy    RA (rheumatoid arthritis) [facility])    rheumatologist-  dr Dierdre Forth   SUI (stress urinary incontinence, female)    Wears glasses    Past Surgical History:  Procedure Laterality Date   ABDOMINAL HYSTERECTOMY  1990   with Right Salpingo--ophorectomy   ANAL SPHINCTEROTOMY  01-05-2006    dr Zachery Dakins @[facility]    BUNIONECTOMY  09-25-2009   dr Al Corpus @[facility]    right great toe   CARPAL TUNNEL RELEASE Bilateral 1996;  1997   CESAREAN SECTION  1988   FOOT SURGERY  12/ 2014   dr Al Corpus   left 2nd hammertoe repair,  left 2nd metatarsal matrixectomy, left heel endoscopic plantar fasiotomy   IR CATHETER TUBE CHANGE  01/01/2019   IR RADIOLOGIST EVAL & MGMT  11/28/2018   IR RADIOLOGIST EVAL & MGMT  11/29/2018   IR RADIOLOGIST EVAL & MGMT  12/06/2018   IR RADIOLOGIST EVAL & MGMT  12/25/2018   IR RADIOLOGIST EVAL & MGMT  01/08/2019   IR RADIOLOGIST EVAL & MGMT  01/29/2019   PANNICULECTOMY N/A 10/26/2018   Procedure: PANNICULECTOMY;  Surgeon: Luretha Murphy, MD;  Location: [facility];  Service: General;  Laterality: N/A;   TENDON REPAIR   09/2015   left posterior tibial and peroneal tendon repairs   TOE AMPUTATION  01/2015   right 2nd toe   TONSILLECTOMY  age 28   TRANSOBTURATOR SLING  01-08-2004   dr Annabell Howells @[facility]    Patient Active Problem List   Diagnosis Date Noted   Degeneration of lumbar intervertebral disc 03/10/2022   Generalized psoriasis 03/10/2022   Other long term (current) drug therapy 03/10/2022   Overweight 03/10/2022   Pain in limb 03/10/2022   Primary osteoarthritis 03/10/2022   Synovial cyst 03/10/2022   S/P panniculectomy 10/26/2018   Panniculitis 09/04/2018   HYPOTHYROIDISM 12/01/2010   Type 2 diabetes mellitus with diabetic polyneuropathy, with long-term current use of insulin (HCC) 12/01/2010   DIAB W/NEURO MANIFESTS TYPE II/UNS NOT UNCNTRL 12/01/2010   OTHER AND UNSPECIFIED HYPERLIPIDEMIA 12/01/2010   OBESITY, UNSPECIFIED 12/01/2010   ESSENTIAL HYPERTENSION, BENIGN 12/01/2010   Rheumatoid arthritis (HCC) 12/01/2010    PCP: Johny Blamer MD   REFERRING PROVIDER:  Tia Alert, MD  REFERRING DIAG: M43.16 (ICD-10-CM) - Spondylolisthesis, lumbar region  Rationale for Evaluation and Treatment: Rehabilitation  THERAPY DIAG:  Other low back pain  Radiculopathy, lumbar region  Muscle weakness (generalized)  ONSET  DATE: chronic   SUBJECTIVE:                                                                                                                                                                                           SUBJECTIVE STATEMENT: Pt reports 3/10 bil SI joint pain. R>L.  Pt received bil SI injections on Thursday 5/30.   PERTINENT HISTORY:  See above  Lumbar L4-L5 fusion, laminectomy Removal of cyst   PAIN:  Are you having pain? Yes: NPRS scale: 3/10 Pain location: bil SI joint pain Pain description: throbbing, aching, stiffess , burning  Aggravating factors: standing, activity  Relieving factors: oxycodone, R  side, pillows    PRECAUTIONS: Fall                                     EXERCISE LOG  Exercise Repetitions and Resistance Comments  Nustep  Lvl 3 x 16 mins   Hip Hike    Lateral Step Down    Standing Hip Abduction    Rockerboard     HS Stretch    Hip Adductor Stretch    Hip Abduction Isometric     Manual Therapy Soft Tissue Mobilization: lumbar, STW/M to bil lumbar paraspinals and SI joint region to decrease pain and tone    Modalities  Date:  Unattended Estim: Lumbar, IFC 80-150 Hz, 15 mins, Pain Hot Pack: Lumbar, 15 mins, Pain and Tone  PATIENT EDUCATION:  Education details:   Person educated:  International aid/development worker:  Education comprehension:  HOME EXERCISE PROGRAM:   ASSESSMENT:  CLINICAL IMPRESSION: Pt arrives for today's treatment session reporting 3/10 low back and bil SI joint pain.  Pt received bil SI joint injections last Thursday 5/30.  Pt reports that stabbing pain is better, but nagging pain continues.  Pt able to increase FOTO score to 53 today.  STW/M performed to bil SI regions to decrease pain and tone with pt seated leaning over elevated plinth.  Pt has made good progress towards her goals at this time, meeting her FOTO goal, HEP goal, and getting really close to meeting her 5 STS goal.  Pt to return to surgeon on 6/16.  Pt plans to go on hold at this time.  Pt reported decreased pain at completion of today's treatment session.  OBJECTIVE IMPAIRMENTS: Abnormal gait, decreased activity tolerance, decreased mobility, difficulty walking, decreased ROM, decreased strength, increased fascial restrictions, increased muscle spasms, impaired flexibility, impaired sensation, postural dysfunction, obesity, and pain.   ACTIVITY LIMITATIONS: carrying, lifting, bending, sitting, standing, squatting, sleeping, stairs, transfers, bed  mobility, locomotion level, and caring for others  PARTICIPATION LIMITATIONS: meal prep, cleaning, laundry, medication management, interpersonal relationship, driving, shopping, community activity, and  occupation  PERSONAL FACTORS: Time since onset of injury/illness/exacerbation and 3+ comorbidities: previous back surgery, diabetes, obesity   are also affecting patient's functional outcome.   REHAB POTENTIAL: Good  CLINICAL DECISION MAKING: Evolving/moderate complexity  EVALUATION COMPLEXITY: Moderate   GOALS: Goals reviewed with patient? No     LONG TERM GOALS: Target date: 03/27/2023    Pt will be able to show Independence with HEP for core and LE Baseline: none Goal status: MET  2.  Pt will be able to perform 5 X STS in < 25 sec to demo improved functional mobility  Baseline: 41 sec  4/24: 27 seconds; 6/3: 26.1 seconds Goal status: IN PROGRESS  3.  Pt will be able to perform light home tasks and ADLs with no more than min increased pain in back and legs.  Baseline: unable, mom has to do; 6/3: has not tried yet Goal status: IN PROGRESS  4.  FOTO score will improve to 47% to demo improved functional mobility  Baseline: 30% 4/24: 43%; 6/3: 53% Goal status: MET  5.  Pt will be able to tolerate supine for short periods of time (20 min or more) in order to allow HEP and sleep  Baseline: unable , sleeps with multiple pillows; 6/3: approx 5 mins Goal status: IN PROGRESS  6.  Further goals TBA by treating PT  Baseline:  Goal status: IN PROGRESS  PLAN:  PT FREQUENCY: 2x/week  PT DURATION: 6 weeks  PLANNED INTERVENTIONS: Therapeutic exercises, Therapeutic activity, Neuromuscular re-education, Balance training, Gait training, Patient/Family education, Self Care, Joint mobilization, Dry Needling, Electrical stimulation, Spinal mobilization, Cryotherapy, Moist heat, Manual therapy, and Re-evaluation.  PLAN FOR NEXT SESSION: dry needling per MD.  Needs HEP and core, hip strength.  Prefers to sit, stand or S/L due to back pain .  Modalities as needed.    Newman Pies, PTA 04/17/2023, 12:03 PM

## 2023-04-19 ENCOUNTER — Other Ambulatory Visit (HOSPITAL_COMMUNITY): Payer: Self-pay

## 2023-04-20 ENCOUNTER — Other Ambulatory Visit (HOSPITAL_COMMUNITY): Payer: Self-pay

## 2023-04-20 MED ORDER — INSULIN LISPRO (1 UNIT DIAL) 100 UNIT/ML (KWIKPEN)
10.0000 [IU] | PEN_INJECTOR | Freq: Three times a day (TID) | SUBCUTANEOUS | 5 refills | Status: DC
  Filled 2023-04-20: qty 39, 87d supply, fill #0
  Filled 2023-06-09: qty 39, 87d supply, fill #1

## 2023-04-26 ENCOUNTER — Other Ambulatory Visit (HOSPITAL_COMMUNITY): Payer: Self-pay

## 2023-04-26 MED ORDER — LANTUS SOLOSTAR 100 UNIT/ML ~~LOC~~ SOPN
10.0000 [IU] | PEN_INJECTOR | Freq: Every day | SUBCUTANEOUS | 3 refills | Status: DC
  Filled 2023-04-26: qty 3, 28d supply, fill #0

## 2023-04-27 ENCOUNTER — Other Ambulatory Visit (HOSPITAL_COMMUNITY): Payer: Self-pay

## 2023-04-27 MED ORDER — OXYCODONE-ACETAMINOPHEN 10-325 MG PO TABS
1.0000 | ORAL_TABLET | Freq: Four times a day (QID) | ORAL | 0 refills | Status: DC | PRN
  Filled 2023-04-27: qty 55, 7d supply, fill #0

## 2023-04-28 ENCOUNTER — Other Ambulatory Visit (HOSPITAL_COMMUNITY): Payer: Self-pay

## 2023-04-28 MED ORDER — LANTUS SOLOSTAR 100 UNIT/ML ~~LOC~~ SOPN
40.0000 [IU] | PEN_INJECTOR | Freq: Two times a day (BID) | SUBCUTANEOUS | 5 refills | Status: DC
  Filled 2023-04-28: qty 60, 75d supply, fill #0
  Filled 2023-06-09: qty 60, 75d supply, fill #1

## 2023-04-28 MED ORDER — INSULIN LISPRO (1 UNIT DIAL) 100 UNIT/ML (KWIKPEN)
10.0000 [IU] | PEN_INJECTOR | Freq: Three times a day (TID) | SUBCUTANEOUS | 5 refills | Status: DC
  Filled 2023-04-28: qty 45, 90d supply, fill #0

## 2023-05-02 ENCOUNTER — Other Ambulatory Visit (HOSPITAL_COMMUNITY): Payer: Self-pay

## 2023-05-02 DIAGNOSIS — M79644 Pain in right finger(s): Secondary | ICD-10-CM | POA: Diagnosis not present

## 2023-05-02 DIAGNOSIS — L401 Generalized pustular psoriasis: Secondary | ICD-10-CM | POA: Diagnosis not present

## 2023-05-02 DIAGNOSIS — E663 Overweight: Secondary | ICD-10-CM | POA: Diagnosis not present

## 2023-05-02 DIAGNOSIS — M1991 Primary osteoarthritis, unspecified site: Secondary | ICD-10-CM | POA: Diagnosis not present

## 2023-05-02 DIAGNOSIS — Z79899 Other long term (current) drug therapy: Secondary | ICD-10-CM | POA: Diagnosis not present

## 2023-05-02 DIAGNOSIS — M461 Sacroiliitis, not elsewhere classified: Secondary | ICD-10-CM | POA: Diagnosis not present

## 2023-05-02 DIAGNOSIS — M5136 Other intervertebral disc degeneration, lumbar region: Secondary | ICD-10-CM | POA: Diagnosis not present

## 2023-05-02 DIAGNOSIS — N183 Chronic kidney disease, stage 3 unspecified: Secondary | ICD-10-CM | POA: Diagnosis not present

## 2023-05-02 DIAGNOSIS — M0589 Other rheumatoid arthritis with rheumatoid factor of multiple sites: Secondary | ICD-10-CM | POA: Diagnosis not present

## 2023-05-02 DIAGNOSIS — Z6829 Body mass index (BMI) 29.0-29.9, adult: Secondary | ICD-10-CM | POA: Diagnosis not present

## 2023-05-05 ENCOUNTER — Other Ambulatory Visit: Payer: Self-pay | Admitting: Neurological Surgery

## 2023-05-05 DIAGNOSIS — M461 Sacroiliitis, not elsewhere classified: Secondary | ICD-10-CM

## 2023-05-08 ENCOUNTER — Ambulatory Visit
Admission: RE | Admit: 2023-05-08 | Discharge: 2023-05-08 | Disposition: A | Discharge status: Home / Self Care | Payer: Commercial Managed Care - PPO | Source: Ambulatory Visit | Attending: Neurological Surgery | Admitting: Neurological Surgery

## 2023-05-08 ENCOUNTER — Other Ambulatory Visit (HOSPITAL_COMMUNITY): Payer: Self-pay

## 2023-05-08 DIAGNOSIS — M461 Sacroiliitis, not elsewhere classified: Secondary | ICD-10-CM

## 2023-05-08 DIAGNOSIS — Z6829 Body mass index (BMI) 29.0-29.9, adult: Secondary | ICD-10-CM | POA: Diagnosis not present

## 2023-05-12 ENCOUNTER — Other Ambulatory Visit (HOSPITAL_COMMUNITY): Payer: Self-pay

## 2023-05-12 MED ORDER — OXYCODONE-ACETAMINOPHEN 10-325 MG PO TABS
1.0000 | ORAL_TABLET | Freq: Four times a day (QID) | ORAL | 0 refills | Status: DC | PRN
  Filled 2023-05-12: qty 55, 7d supply, fill #0

## 2023-05-15 ENCOUNTER — Other Ambulatory Visit (HOSPITAL_COMMUNITY): Payer: Self-pay

## 2023-05-16 ENCOUNTER — Other Ambulatory Visit (HOSPITAL_COMMUNITY): Payer: Self-pay

## 2023-05-24 ENCOUNTER — Other Ambulatory Visit (HOSPITAL_COMMUNITY): Payer: Self-pay

## 2023-05-24 MED ORDER — OXYCODONE-ACETAMINOPHEN 10-325 MG PO TABS
1.0000 | ORAL_TABLET | Freq: Four times a day (QID) | ORAL | 0 refills | Status: DC | PRN
  Filled 2023-05-24: qty 55, 7d supply, fill #0

## 2023-05-26 ENCOUNTER — Other Ambulatory Visit (HOSPITAL_COMMUNITY): Payer: Self-pay

## 2023-06-05 ENCOUNTER — Other Ambulatory Visit (HOSPITAL_COMMUNITY): Payer: Self-pay

## 2023-06-05 DIAGNOSIS — M461 Sacroiliitis, not elsewhere classified: Secondary | ICD-10-CM | POA: Diagnosis not present

## 2023-06-05 DIAGNOSIS — Z6829 Body mass index (BMI) 29.0-29.9, adult: Secondary | ICD-10-CM | POA: Diagnosis not present

## 2023-06-05 MED ORDER — OXYCODONE-ACETAMINOPHEN 10-325 MG PO TABS
1.0000 | ORAL_TABLET | Freq: Four times a day (QID) | ORAL | 0 refills | Status: DC | PRN
  Filled 2023-06-05: qty 55, 7d supply, fill #0

## 2023-06-07 DIAGNOSIS — N183 Chronic kidney disease, stage 3 unspecified: Secondary | ICD-10-CM | POA: Diagnosis not present

## 2023-06-07 DIAGNOSIS — M1991 Primary osteoarthritis, unspecified site: Secondary | ICD-10-CM | POA: Diagnosis not present

## 2023-06-07 DIAGNOSIS — Z6829 Body mass index (BMI) 29.0-29.9, adult: Secondary | ICD-10-CM | POA: Diagnosis not present

## 2023-06-07 DIAGNOSIS — L401 Generalized pustular psoriasis: Secondary | ICD-10-CM | POA: Diagnosis not present

## 2023-06-07 DIAGNOSIS — E663 Overweight: Secondary | ICD-10-CM | POA: Diagnosis not present

## 2023-06-07 DIAGNOSIS — Z79899 Other long term (current) drug therapy: Secondary | ICD-10-CM | POA: Diagnosis not present

## 2023-06-07 DIAGNOSIS — M5136 Other intervertebral disc degeneration, lumbar region: Secondary | ICD-10-CM | POA: Diagnosis not present

## 2023-06-07 DIAGNOSIS — M0589 Other rheumatoid arthritis with rheumatoid factor of multiple sites: Secondary | ICD-10-CM | POA: Diagnosis not present

## 2023-06-07 DIAGNOSIS — M79644 Pain in right finger(s): Secondary | ICD-10-CM | POA: Diagnosis not present

## 2023-06-09 ENCOUNTER — Other Ambulatory Visit (HOSPITAL_COMMUNITY): Payer: Self-pay

## 2023-06-12 ENCOUNTER — Telehealth: Payer: Self-pay

## 2023-06-12 ENCOUNTER — Other Ambulatory Visit (HOSPITAL_COMMUNITY): Payer: Self-pay

## 2023-06-12 ENCOUNTER — Other Ambulatory Visit: Payer: Self-pay

## 2023-06-12 MED ORDER — METFORMIN HCL ER 500 MG PO TB24
1000.0000 mg | ORAL_TABLET | Freq: Two times a day (BID) | ORAL | 0 refills | Status: DC
  Filled 2023-06-12: qty 120, 30d supply, fill #0

## 2023-06-12 NOTE — Telephone Encounter (Signed)
Call placed to Amy Adams, she is scheduled for an appointment on 06/14/2023, she verbalizes understanding.

## 2023-06-12 NOTE — Telephone Encounter (Signed)
Patient called stating that her job letting her go because she has exhausted her FMLA due to surgery and she will have no insurance after Wednesday the 31st. She is requesting a refill on Tramadol-3 month supply.

## 2023-06-14 ENCOUNTER — Encounter: Payer: Self-pay | Admitting: Registered Nurse

## 2023-06-14 ENCOUNTER — Encounter: Payer: Commercial Managed Care - PPO | Attending: Registered Nurse | Admitting: Registered Nurse

## 2023-06-14 ENCOUNTER — Ambulatory Visit: Payer: Commercial Managed Care - PPO | Admitting: Physical Therapy

## 2023-06-14 ENCOUNTER — Other Ambulatory Visit (HOSPITAL_COMMUNITY): Payer: Self-pay

## 2023-06-14 VITALS — BP 177/101 | HR 90 | Ht 69.0 in | Wt 201.0 lb

## 2023-06-14 DIAGNOSIS — M79672 Pain in left foot: Secondary | ICD-10-CM | POA: Diagnosis not present

## 2023-06-14 DIAGNOSIS — M25512 Pain in left shoulder: Secondary | ICD-10-CM | POA: Insufficient documentation

## 2023-06-14 DIAGNOSIS — Z79899 Other long term (current) drug therapy: Secondary | ICD-10-CM | POA: Diagnosis not present

## 2023-06-14 DIAGNOSIS — G894 Chronic pain syndrome: Secondary | ICD-10-CM | POA: Insufficient documentation

## 2023-06-14 DIAGNOSIS — M545 Low back pain, unspecified: Secondary | ICD-10-CM | POA: Diagnosis not present

## 2023-06-14 DIAGNOSIS — Z5181 Encounter for therapeutic drug level monitoring: Secondary | ICD-10-CM | POA: Insufficient documentation

## 2023-06-14 DIAGNOSIS — G8929 Other chronic pain: Secondary | ICD-10-CM | POA: Insufficient documentation

## 2023-06-14 DIAGNOSIS — M2550 Pain in unspecified joint: Secondary | ICD-10-CM | POA: Diagnosis not present

## 2023-06-14 MED ORDER — TRAMADOL HCL 50 MG PO TABS
50.0000 mg | ORAL_TABLET | Freq: Three times a day (TID) | ORAL | 0 refills | Status: DC | PRN
  Filled 2023-06-14 – 2023-06-16 (×2): qty 270, 90d supply, fill #0

## 2023-06-14 NOTE — Progress Notes (Signed)
Subjective:    Patient ID: Amy Adams, female    DOB: [birth year], 57 y.o.   MRN: [id number]  HPI: Amy Adams is a 57 y.o. female who returns for follow up appointment for chronic pain and medication refill. She states her pain is located in her left shoulder, lower back, left foot and reports generalized joint pain. She rates her pain  5. Her current exercise regime is walking and performing stretching exercises.  Amy Adams reports her insurance will expired today.   Amy Adams Morphine equivalent is 66.79 MME.   Her oxycodone was prescribed by Dr Yetta Barre, she returns to our office to resume her Tramadol.   Oral Swab was Performed today.     Pain Inventory Average Pain 7 Pain Right Now 5 My pain is constant, sharp, dull, stabbing, and aching  In the last 24 hours, has pain interfered with the following? General activity 9 Relation with others 10 Enjoyment of life 10 What TIME of day is your pain at its worst? evening and night Sleep (in general) Poor  Pain is worse with: walking, bending, and sitting Pain improves with: medication Relief from Meds: 9  Family History  Problem Relation Age of Onset   Diabetes Mother    Migraines Mother    Arrhythmia Father    COPD Father    Cancer Father    Diabetes Sister    Social History   Socioeconomic History   Marital status: Widowed    Spouse name: Not on file   Number of children: 2   Years of education: Not on file   Highest education level: Not on file  Occupational History   Not on file  Tobacco Use   Smoking status: Never   Smokeless tobacco: Never  Vaping Use   Vaping status: Never Used  Substance and Sexual Activity   Alcohol use: No   Drug use: Never   Sexual activity: Not on file  Other Topics Concern   Not on file  Social History Narrative   [employer]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   Are you right handed or left handed? Right   Are you currently employed ? [employer]   What is your current occupation?   Do  you live at home alone? yes   Caffeine 2 weekly soda   What type of home do you live in: 1 story or 2 story? one       Social Determinants of Health   Financial Resource Strain: Not on file  Food Insecurity: Not on file  Transportation Needs: Not on file  Physical Activity: Not on file  Stress: Not on file  Social Connections: Not on file   Past Surgical History:  Procedure Laterality Date   ABDOMINAL HYSTERECTOMY  1990   with Right Salpingo--ophorectomy   ANAL SPHINCTEROTOMY  01-05-2006    dr Zachery Dakins @[facility]    BUNIONECTOMY  09-25-2009   dr Al Corpus @[facility]    right great toe   CARPAL TUNNEL RELEASE Bilateral 1996;  1997   CESAREAN SECTION  1988   FOOT SURGERY  12/ 2014   dr Al Corpus   left 2nd hammertoe repair,  left 2nd metatarsal matrixectomy, left heel endoscopic plantar fasiotomy   IR CATHETER TUBE CHANGE  01/01/2019   IR RADIOLOGIST EVAL & MGMT  11/28/2018   IR RADIOLOGIST EVAL & MGMT  11/29/2018   IR RADIOLOGIST EVAL & MGMT  12/06/2018   IR RADIOLOGIST EVAL & MGMT  12/25/2018   IR RADIOLOGIST EVAL & MGMT  01/08/2019   IR RADIOLOGIST EVAL & MGMT  01/29/2019   PANNICULECTOMY N/A 10/26/2018   Procedure: PANNICULECTOMY;  Surgeon: Luretha Murphy, MD;  Location: [facility];  Service: General;  Laterality: N/A;   TENDON REPAIR  09/2015   left posterior tibial and peroneal tendon repairs   TOE AMPUTATION  01/2015   right 2nd toe   TONSILLECTOMY  age 83   TRANSOBTURATOR SLING  01-08-2004   dr Annabell Howells @[facility]    Past Surgical History:  Procedure Laterality Date   ABDOMINAL HYSTERECTOMY  1990   with Right Salpingo--ophorectomy   ANAL SPHINCTEROTOMY  01-05-2006    dr Zachery Dakins @[facility]    BUNIONECTOMY  09-25-2009   dr Al Corpus @[facility]    right great toe   CARPAL TUNNEL RELEASE Bilateral 1996;  1997   CESAREAN SECTION  1988   FOOT SURGERY  12/ 2014   dr Al Corpus   left 2nd hammertoe repair,  left 2nd metatarsal matrixectomy, left heel endoscopic plantar fasiotomy   IR CATHETER TUBE CHANGE  01/01/2019   IR  RADIOLOGIST EVAL & MGMT  11/28/2018   IR RADIOLOGIST EVAL & MGMT  11/29/2018   IR RADIOLOGIST EVAL & MGMT  12/06/2018   IR RADIOLOGIST EVAL & MGMT  12/25/2018   IR RADIOLOGIST EVAL & MGMT  01/08/2019   IR RADIOLOGIST EVAL & MGMT  01/29/2019   PANNICULECTOMY N/A 10/26/2018   Procedure: PANNICULECTOMY;  Surgeon: Luretha Murphy, MD;  Location: [facility];  Service: General;  Laterality: N/A;   TENDON REPAIR  09/2015   left posterior tibial and peroneal tendon repairs   TOE AMPUTATION  01/2015   right 2nd toe   TONSILLECTOMY  age 18   TRANSOBTURATOR SLING  01-08-2004   dr Annabell Howells @[facility]    Past Medical History:  Diagnosis Date   Anxiety    Arthritis    Chronic foot pain, left    GERD (gastroesophageal reflux disease)    History of anal fissures    Hyperlipidemia    Hypertension    Hypothyroidism    Insulin dependent type 2 diabetes mellitus (HCC)    endrocrinologist-- dr Talmage Nap   Migraines    Peripheral neuropathy    RA (rheumatoid arthritis) [facility])    rheumatologist-  dr Dierdre Forth   SUI (stress urinary incontinence, female)    Wears glasses    BP (!) 177/101   Pulse 90   Ht 5\' 9"  (1.753 m)   Wt 201 lb (91.2 kg)   SpO2 99%   BMI 29.68 kg/m   Opioid Risk Score:   Fall Risk Score:  `1  Depression screen PHQ 2/9     02/21/2023    2:16 PM 03/11/2022    1:04 PM 09/02/2021    9:14 AM 08/12/2021    2:31 PM 12/23/2019    8:13 AM 04/03/2019    9:01 AM 01/11/2018    5:20 PM  Depression screen PHQ 2/9  Decreased Interest 3 0 0 0 0 1 1  Down, Depressed, Hopeless 3 0 0 1 0 0 2  PHQ - 2 Score 6 0 0 1 0 1 3  Altered sleeping    3     Tired, decreased energy    1     Change in appetite    3     Feeling bad or failure about yourself     0     Trouble concentrating    0     Moving slowly or fidgety/restless    0     Suicidal thoughts  0     PHQ-9 Score    8     Difficult doing work/chores    Not difficult at all        Review of Systems  Musculoskeletal:  Positive for back pain.       LT  shoulder RT hand LT foot pain  All other systems reviewed and are negative.      Objective:   Physical Exam Vitals and nursing note reviewed.  Constitutional:      Appearance: Normal appearance.  Cardiovascular:     Rate and Rhythm: Normal rate and regular rhythm.     Pulses: Normal pulses.     Heart sounds: Normal heart sounds.  Pulmonary:     Effort: Pulmonary effort is normal.     Breath sounds: Normal breath sounds.  Musculoskeletal:     Cervical back: Normal range of motion and neck supple.     Comments: Normal Muscle Bulk and Muscle Testing Reveals:  Upper Extremities: Full ROM and Muscle Strength 5/5  Lumbar Paraspinal Tenderness: L-4-L-5 Lower Extremities: Full ROM and Muscle Strength 5/5 Arises from Table with ease Narrow Based Gait     Skin:    General: Skin is warm and dry.  Neurological:     Mental Status: She is alert and oriented to person, place, and time.  Psychiatric:        Mood and Affect: Mood normal.        Behavior: Behavior normal.         Assessment & Plan:  Right Lumbar Radiculitis/ Chronic Bilateral Low Back Pain without Sciatica: S/P   surgery on 11/11/2022: L4-5 posterior lumbar interbody fusion by Dr Yetta Barre.Continue HEP as Tolerated. . Continue current medication regimen. Continue to monitor. 06/14/2023 Painful Diabetic Neuropathy: This provider sent a message to Dr Wynn Banker regarding Resuming her   Tramadol 50 mg TID as needed for pain #270, as we wait for Oral Swab results  time. Reviewed narcotic policy with Amy Adams, she verbalizes understanding. Message was sent to Dr Wynn Banker agrees with three month supply. Continue Gabapentin.06/14/2023 We will continue the opioid monitoring program, this consists of regular clinic visits, examinations, urine drug screen, pill counts as well as use of [place] Controlled Substance Reporting system. A 12 month History has been reviewed on the [place] Controlled Substance Reporting System on  06/14/2023   F/U in 6 months

## 2023-06-16 ENCOUNTER — Telehealth: Payer: Self-pay | Admitting: Registered Nurse

## 2023-06-16 ENCOUNTER — Other Ambulatory Visit (HOSPITAL_COMMUNITY): Payer: Self-pay

## 2023-06-16 NOTE — Telephone Encounter (Signed)
Please send 3 month supply of Tramadol to the [facility] on  [address].

## 2023-06-16 NOTE — Telephone Encounter (Signed)
Call was Placed to Ms. Doswell, she will call pharmacies to see what the cash price for Tramadol will be. Awaiting a return call.

## 2023-06-19 ENCOUNTER — Other Ambulatory Visit (HOSPITAL_COMMUNITY): Payer: Self-pay

## 2023-06-23 ENCOUNTER — Other Ambulatory Visit (HOSPITAL_COMMUNITY): Payer: Self-pay

## 2023-06-26 ENCOUNTER — Other Ambulatory Visit (HOSPITAL_COMMUNITY): Payer: Self-pay

## 2023-06-26 ENCOUNTER — Other Ambulatory Visit: Payer: Self-pay

## 2023-07-18 ENCOUNTER — Other Ambulatory Visit (HOSPITAL_COMMUNITY): Payer: Self-pay

## 2023-07-19 ENCOUNTER — Other Ambulatory Visit: Payer: Self-pay

## 2023-07-19 ENCOUNTER — Other Ambulatory Visit (HOSPITAL_COMMUNITY): Payer: Self-pay

## 2023-07-21 ENCOUNTER — Other Ambulatory Visit: Payer: Self-pay

## 2023-07-24 ENCOUNTER — Encounter: Payer: Self-pay | Admitting: Physical Therapy

## 2023-07-24 ENCOUNTER — Other Ambulatory Visit: Payer: Self-pay

## 2023-07-24 ENCOUNTER — Ambulatory Visit: Payer: 59 | Attending: Neurological Surgery | Admitting: Physical Therapy

## 2023-07-24 DIAGNOSIS — M5416 Radiculopathy, lumbar region: Secondary | ICD-10-CM | POA: Insufficient documentation

## 2023-07-24 DIAGNOSIS — M6281 Muscle weakness (generalized): Secondary | ICD-10-CM | POA: Insufficient documentation

## 2023-07-24 DIAGNOSIS — M5459 Other low back pain: Secondary | ICD-10-CM | POA: Diagnosis not present

## 2023-07-24 NOTE — Therapy (Signed)
OUTPATIENT PHYSICAL THERAPY THORACOLUMBAR EVALUATION   Patient Name: Amy Adams MRN: [id number] DOB:1966-03-20, 57 y.o., female Today's Date: 07/24/2023  END OF SESSION:  PT End of Session - 07/24/23 1402     Visit Number 1    Number of Visits 12    Date for PT Re-Evaluation 09/04/23    PT Start Time 0109    PT Stop Time 0142    PT Time Calculation (min) 33 min    Activity Tolerance Patient tolerated treatment well    Behavior During Therapy WFL for tasks assessed/performed             Past Medical History:  Diagnosis Date   Anxiety    Arthritis    Chronic foot pain, left    GERD (gastroesophageal reflux disease)    History of anal fissures    Hyperlipidemia    Hypertension    Hypothyroidism    Insulin dependent type 2 diabetes mellitus (HCC)    endrocrinologist-- dr Talmage Nap   Migraines    Peripheral neuropathy    RA (rheumatoid arthritis) [facility])    rheumatologist-  dr Dierdre Forth   SUI (stress urinary incontinence, female)    Wears glasses    Past Surgical History:  Procedure Laterality Date   ABDOMINAL HYSTERECTOMY  1990   with Right Salpingo--ophorectomy   ANAL SPHINCTEROTOMY  01-05-2006    dr Zachery Dakins @[facility]    BUNIONECTOMY  09-25-2009   dr Al Corpus @[facility]    right great toe   CARPAL TUNNEL RELEASE Bilateral 1996;  1997   CESAREAN SECTION  1988   FOOT SURGERY  12/ 2014   dr Al Corpus   left 2nd hammertoe repair,  left 2nd metatarsal matrixectomy, left heel endoscopic plantar fasiotomy   IR CATHETER TUBE CHANGE  01/01/2019   IR RADIOLOGIST EVAL & MGMT  11/28/2018   IR RADIOLOGIST EVAL & MGMT  11/29/2018   IR RADIOLOGIST EVAL & MGMT  12/06/2018   IR RADIOLOGIST EVAL & MGMT  12/25/2018   IR RADIOLOGIST EVAL & MGMT  01/08/2019   IR RADIOLOGIST EVAL & MGMT  01/29/2019   PANNICULECTOMY N/A 10/26/2018   Procedure: PANNICULECTOMY;  Surgeon: Luretha Murphy, MD;  Location: [facility];  Service: General;  Laterality: N/A;   TENDON REPAIR  09/2015   left posterior tibial and  peroneal tendon repairs   TOE AMPUTATION  01/2015   right 2nd toe   TONSILLECTOMY  age 23   TRANSOBTURATOR SLING  01-08-2004   dr Annabell Howells @[facility]    Patient Active Problem List   Diagnosis Date Noted   Degeneration of lumbar intervertebral disc 03/10/2022   Generalized psoriasis 03/10/2022   Other long term (current) drug therapy 03/10/2022   Overweight 03/10/2022   Pain in limb 03/10/2022   Primary osteoarthritis 03/10/2022   Synovial cyst 03/10/2022   S/P panniculectomy 10/26/2018   Panniculitis 09/04/2018   HYPOTHYROIDISM 12/01/2010   Type 2 diabetes mellitus with diabetic polyneuropathy, with long-term current use of insulin (HCC) 12/01/2010   DIAB W/NEURO MANIFESTS TYPE II/UNS NOT UNCNTRL 12/01/2010   OTHER AND UNSPECIFIED HYPERLIPIDEMIA 12/01/2010   OBESITY, UNSPECIFIED 12/01/2010   ESSENTIAL HYPERTENSION, BENIGN 12/01/2010   Rheumatoid arthritis (HCC) 12/01/2010    REFERRING PROVIDER: Kendell Bane Dawley DO.  REFERRING DIAG: Bilateral Sacroiliitis   Rationale for Evaluation and Treatment: Rehabilitation  THERAPY DIAG:  Other low back pain - Plan: PT plan of care cert/re-cert  ONSET DATE: Chronic.  SUBJECTIVE:  SUBJECTIVE STATEMENT: The patient returns to the clinic today (last seen on 04/17/23) with a diagnosis of bilateral sacroiliitis.  The patient reports a pain-level of a 7/10 today but has occasions of severe pain.  She has a sensation of her back popping in and (referring to the SIJ regions).  She has not found anything decreases her pain.  In order to get out of bed she has to pull up on a bed post.  Prolonged walking can cause severe pain.  She reports her left foot is numb.     PERTINENT HISTORY:  See above  Lumbar L4-L5 fusion, laminectomy Removal of cyst  PAIN:  Are you having pain?  Yes: NPRS scale: 7/10 Pain location: Bilateral SIJ's. Pain description: Sharp. Aggravating factors: Standing, walking.  Sitting without good lumbar support. Relieving factors: Nothing.  PRECAUTIONS: Fall.  Recommend cane usage.  WEIGHT BEARING RESTRICTIONS: No  FALLS:  Has patient fallen in last 6 months? Yes. Number of falls 3.  Occurred when pain hit (back popping out).  LIVING ENVIRONMENT: Lives with: lives with their family Lives in: House/apartment Has following equipment at home: None  OCCUPATION: Currently out of work.  PLOF:  Prior to onset of pain she was independent.  PATIENT GOALS: Not have this pain.  Not have back give out.  Get quality of life back.   OBJECTIVE:   SCREENING FOR RED FLAGS: No.   SENSATION: C/o left foot numbness.  POSTURE: rounded shoulders, forward head, and decreased lumbar lordosis  PALPATION: Very tender to palpation over bilateral SIJ's.  Also c/o tenderness over lumbar incisional site.  LUMBAR ROM:   Active lumbar flexion limited by 50% and extension limited to 10 degrees.  LOWER EXTREMITY ROM:     WFL.  LOWER EXTREMITY MMT:    Normal bilateral knee and ankle strength.  LUMBAR SPECIAL TESTS:  Equal leg lengths. Deferred SIJ testing due to high pain-level in these areas (SIJ's).   GAIT:/TRANSFERS: Very antalgic in nature.  Transitory movements are also performed with patient in obvious pain.     PATIENT EDUCATION:  Education details: Discussed plan for spinal stabilization progression, BM and spinal protection techniques. Person educated: Patient Education method: Explanation Education comprehension: verbalized understanding  HOME EXERCISE PROGRAM:   ASSESSMENT:  CLINICAL IMPRESSION: The patient presents to [facility] in obvious pain.  Her pain prohibits her from performing ADL's and having the quality of life she desires.  She has c/o bilateral SIJ pain.  She reports a feeling of her back popping out producing severe  pain and has caused her to fall (recommended cane usage).  She walks with significant antalgia and transitory movements are performed slowly and painfully. She has limited lumbar motion.  Patient may benefit from skilled physical therapy intervention to address pain and deficits.  OBJECTIVE IMPAIRMENTS: Abnormal gait, decreased activity tolerance, decreased mobility, difficulty walking, decreased ROM, increased muscle spasms, postural dysfunction, and pain.   ACTIVITY LIMITATIONS: carrying, lifting, bending, sitting, standing, squatting, sleeping, stairs, transfers, bed mobility, locomotion level, and caring for others  PARTICIPATION LIMITATIONS: meal prep, cleaning, laundry, shopping, community activity, and yard work  PERSONAL FACTORS: Time since onset of injury/illness/exacerbation and 1 comorbidity: previous back surgery  are also affecting patient's functional outcome.   REHAB POTENTIAL: Good+.  CLINICAL DECISION MAKING: Evolving/moderate complexity  EVALUATION COMPLEXITY: Moderate   GOALS:  LONG TERM GOALS: Target date: 09/04/23.  Ind with a HEP. Baseline:  Goal status: INITIAL  2.  Patient perform transitory movements (ie:  sit to stand,  supine to sit) with pain not > 3-4/10.  Goal status: INITIAL  3.  Walk a community distance with pain not > 3-4/10.  Goal status: INITIAL  4.  Perform ADL's with pain not > 3-4/10.  Goal status: INITIAL  PLAN:  PT FREQUENCY: 2x/week  PT DURATION: 6 weeks  PLANNED INTERVENTIONS: Therapeutic exercises, Therapeutic activity, Neuromuscular re-education, Balance training, Gait training, Patient/Family education, Self Care, Dry Needling, Electrical stimulation, Cryotherapy, Moist heat, Ultrasound, and Manual therapy.  PLAN FOR NEXT SESSION: Core exercise progression, spinal protection techniques and body mechanics training.   Adalind Weitz, [place], PT 07/24/2023, 2:54 PM

## 2023-07-25 ENCOUNTER — Other Ambulatory Visit (HOSPITAL_COMMUNITY): Payer: Self-pay

## 2023-07-26 ENCOUNTER — Other Ambulatory Visit: Payer: Self-pay

## 2023-07-26 ENCOUNTER — Other Ambulatory Visit (HOSPITAL_COMMUNITY): Payer: Self-pay

## 2023-07-26 DIAGNOSIS — E039 Hypothyroidism, unspecified: Secondary | ICD-10-CM | POA: Diagnosis not present

## 2023-07-26 DIAGNOSIS — J4520 Mild intermittent asthma, uncomplicated: Secondary | ICD-10-CM | POA: Diagnosis not present

## 2023-07-26 DIAGNOSIS — E559 Vitamin D deficiency, unspecified: Secondary | ICD-10-CM | POA: Diagnosis not present

## 2023-07-26 DIAGNOSIS — G4700 Insomnia, unspecified: Secondary | ICD-10-CM | POA: Diagnosis not present

## 2023-07-26 DIAGNOSIS — I10 Essential (primary) hypertension: Secondary | ICD-10-CM | POA: Diagnosis not present

## 2023-07-26 DIAGNOSIS — F324 Major depressive disorder, single episode, in partial remission: Secondary | ICD-10-CM | POA: Diagnosis not present

## 2023-07-26 DIAGNOSIS — F419 Anxiety disorder, unspecified: Secondary | ICD-10-CM | POA: Diagnosis not present

## 2023-07-26 DIAGNOSIS — E782 Mixed hyperlipidemia: Secondary | ICD-10-CM | POA: Diagnosis not present

## 2023-07-26 DIAGNOSIS — M069 Rheumatoid arthritis, unspecified: Secondary | ICD-10-CM | POA: Diagnosis not present

## 2023-07-26 DIAGNOSIS — E1142 Type 2 diabetes mellitus with diabetic polyneuropathy: Secondary | ICD-10-CM | POA: Diagnosis not present

## 2023-07-26 DIAGNOSIS — K219 Gastro-esophageal reflux disease without esophagitis: Secondary | ICD-10-CM | POA: Diagnosis not present

## 2023-07-26 DIAGNOSIS — Z23 Encounter for immunization: Secondary | ICD-10-CM | POA: Diagnosis not present

## 2023-07-26 MED ORDER — VALSARTAN 320 MG PO TABS
320.0000 mg | ORAL_TABLET | Freq: Every day | ORAL | 3 refills | Status: DC
  Filled 2023-09-19: qty 30, 30d supply, fill #0
  Filled 2023-10-06 – 2023-10-13 (×4): qty 30, 30d supply, fill #1
  Filled 2023-11-28: qty 30, 30d supply, fill #2

## 2023-07-26 MED ORDER — TRULICITY 0.75 MG/0.5ML ~~LOC~~ SOAJ
0.7500 mg | SUBCUTANEOUS | 0 refills | Status: DC
  Filled 2023-07-26: qty 2, 28d supply, fill #0

## 2023-07-26 MED ORDER — GABAPENTIN 400 MG PO CAPS: 400.0000 mg | ORAL_CAPSULE | Freq: Three times a day (TID) | ORAL | 3 refills | Status: DC

## 2023-07-26 MED ORDER — FENOFIBRATE MICRONIZED 134 MG PO CAPS
134.0000 mg | ORAL_CAPSULE | Freq: Every day | ORAL | 3 refills | Status: DC
  Filled 2023-09-19: qty 30, 30d supply, fill #0
  Filled 2023-11-04: qty 30, 30d supply, fill #1

## 2023-07-26 MED ORDER — TRULICITY 0.75 MG/0.5ML ~~LOC~~ SOAJ
0.7500 mg | SUBCUTANEOUS | 5 refills | Status: DC
  Filled 2023-07-26: qty 2, 28d supply, fill #0
  Filled 2023-09-19: qty 2, 28d supply, fill #1
  Filled 2023-10-19: qty 2, 28d supply, fill #2

## 2023-07-26 MED ORDER — MONTELUKAST SODIUM 10 MG PO TABS
10.0000 mg | ORAL_TABLET | Freq: Every evening | ORAL | 3 refills | Status: AC
  Filled 2023-07-26: qty 30, 30d supply, fill #0

## 2023-07-26 MED ORDER — OMEPRAZOLE 40 MG PO CPDR
40.0000 mg | DELAYED_RELEASE_CAPSULE | Freq: Two times a day (BID) | ORAL | 3 refills | Status: DC
  Filled 2023-08-28: qty 60, 30d supply, fill #0
  Filled 2023-09-22: qty 60, 30d supply, fill #1

## 2023-07-26 MED ORDER — ALPRAZOLAM 1 MG PO TABS
1.0000 mg | ORAL_TABLET | Freq: Three times a day (TID) | ORAL | 1 refills | Status: DC | PRN
  Filled 2023-09-22: qty 90, 30d supply, fill #0
  Filled 2023-10-16 – 2023-10-20 (×4): qty 90, 30d supply, fill #1
  Filled 2023-11-28: qty 90, 30d supply, fill #2

## 2023-07-26 MED ORDER — DULOXETINE HCL 60 MG PO CPEP
60.0000 mg | ORAL_CAPSULE | Freq: Two times a day (BID) | ORAL | 3 refills | Status: DC
  Filled 2023-07-26: qty 60, 30d supply, fill #0

## 2023-07-26 MED ORDER — ZOLPIDEM TARTRATE ER 12.5 MG PO TBCR
12.5000 mg | EXTENDED_RELEASE_TABLET | Freq: Every evening | ORAL | 1 refills | Status: DC | PRN
  Filled 2023-07-26: qty 15, 30d supply, fill #0
  Filled 2023-09-22 (×3): qty 15, 30d supply, fill #1
  Filled 2023-09-26: qty 15, 30d supply, fill #2
  Filled 2023-11-04: qty 15, 30d supply, fill #3
  Filled 2023-11-28: qty 15, 15d supply, fill #4
  Filled 2023-11-29: qty 15, 30d supply, fill #4

## 2023-07-26 MED ORDER — CHLORTHALIDONE 25 MG PO TABS
25.0000 mg | ORAL_TABLET | Freq: Every day | ORAL | 3 refills | Status: DC
  Filled 2023-07-26: qty 30, 30d supply, fill #0
  Filled 2023-09-03: qty 30, 30d supply, fill #1
  Filled 2023-10-06: qty 30, 30d supply, fill #2
  Filled 2023-11-04: qty 30, 30d supply, fill #3

## 2023-07-26 MED ORDER — LEVOCETIRIZINE DIHYDROCHLORIDE 5 MG PO TABS
5.0000 mg | ORAL_TABLET | Freq: Every evening | ORAL | 3 refills | Status: DC
  Filled 2023-09-19: qty 30, 30d supply, fill #0
  Filled 2023-10-09 – 2023-10-13 (×2): qty 30, 30d supply, fill #1
  Filled 2023-11-28: qty 30, 30d supply, fill #2

## 2023-07-27 ENCOUNTER — Other Ambulatory Visit (HOSPITAL_COMMUNITY): Payer: Self-pay

## 2023-07-28 DIAGNOSIS — M461 Sacroiliitis, not elsewhere classified: Secondary | ICD-10-CM | POA: Diagnosis not present

## 2023-08-01 ENCOUNTER — Ambulatory Visit: Payer: 59 | Admitting: *Deleted

## 2023-08-01 ENCOUNTER — Encounter: Payer: Self-pay | Admitting: *Deleted

## 2023-08-01 DIAGNOSIS — M5416 Radiculopathy, lumbar region: Secondary | ICD-10-CM | POA: Diagnosis not present

## 2023-08-01 DIAGNOSIS — M6281 Muscle weakness (generalized): Secondary | ICD-10-CM | POA: Diagnosis not present

## 2023-08-01 DIAGNOSIS — M5459 Other low back pain: Secondary | ICD-10-CM

## 2023-08-01 NOTE — Therapy (Signed)
OUTPATIENT PHYSICAL THERAPY THORACOLUMBAR TREATMENT   Patient Name: Amy Adams MRN: [id number] DOB:06-Mar-1966, 57 y.o., female Today's Date: 08/01/2023  END OF SESSION:  PT End of Session - 08/01/23 1254     Visit Number 2    Number of Visits 12    Date for PT Re-Evaluation 09/04/23    Authorization Type [facility] [employer]    PT Start Time 1300    PT Stop Time 1347    PT Time Calculation (min) 47 min             Past Medical History:  Diagnosis Date   Anxiety    Arthritis    Chronic foot pain, left    GERD (gastroesophageal reflux disease)    History of anal fissures    Hyperlipidemia    Hypertension    Hypothyroidism    Insulin dependent type 2 diabetes mellitus (HCC)    endrocrinologist-- dr Talmage Nap   Migraines    Peripheral neuropathy    RA (rheumatoid arthritis) [facility])    rheumatologist-  dr Dierdre Forth   SUI (stress urinary incontinence, female)    Wears glasses    Past Surgical History:  Procedure Laterality Date   ABDOMINAL HYSTERECTOMY  1990   with Right Salpingo--ophorectomy   ANAL SPHINCTEROTOMY  01-05-2006    dr Zachery Dakins @[facility]    BUNIONECTOMY  09-25-2009   dr Al Corpus @[facility]    right great toe   CARPAL TUNNEL RELEASE Bilateral 1996;  1997   CESAREAN SECTION  1988   FOOT SURGERY  12/ 2014   dr Al Corpus   left 2nd hammertoe repair,  left 2nd metatarsal matrixectomy, left heel endoscopic plantar fasiotomy   IR CATHETER TUBE CHANGE  01/01/2019   IR RADIOLOGIST EVAL & MGMT  11/28/2018   IR RADIOLOGIST EVAL & MGMT  11/29/2018   IR RADIOLOGIST EVAL & MGMT  12/06/2018   IR RADIOLOGIST EVAL & MGMT  12/25/2018   IR RADIOLOGIST EVAL & MGMT  01/08/2019   IR RADIOLOGIST EVAL & MGMT  01/29/2019   PANNICULECTOMY N/A 10/26/2018   Procedure: PANNICULECTOMY;  Surgeon: Luretha Murphy, MD;  Location: [facility];  Service: General;  Laterality: N/A;   TENDON REPAIR  09/2015   left posterior tibial and peroneal tendon repairs   TOE AMPUTATION  01/2015   right 2nd toe    TONSILLECTOMY  age 36   TRANSOBTURATOR SLING  01-08-2004   dr Annabell Howells @[facility]    Patient Active Problem List   Diagnosis Date Noted   Degeneration of lumbar intervertebral disc 03/10/2022   Generalized psoriasis 03/10/2022   Other long term (current) drug therapy 03/10/2022   Overweight 03/10/2022   Pain in limb 03/10/2022   Primary osteoarthritis 03/10/2022   Synovial cyst 03/10/2022   S/P panniculectomy 10/26/2018   Panniculitis 09/04/2018   HYPOTHYROIDISM 12/01/2010   Type 2 diabetes mellitus with diabetic polyneuropathy, with long-term current use of insulin (HCC) 12/01/2010   DIAB W/NEURO MANIFESTS TYPE II/UNS NOT UNCNTRL 12/01/2010   OTHER AND UNSPECIFIED HYPERLIPIDEMIA 12/01/2010   OBESITY, UNSPECIFIED 12/01/2010   ESSENTIAL HYPERTENSION, BENIGN 12/01/2010   Rheumatoid arthritis (HCC) 12/01/2010    REFERRING PROVIDER: Kendell Bane Dawley DO.  REFERRING DIAG: Bilateral Sacroiliitis   Rationale for Evaluation and Treatment: Rehabilitation  THERAPY DIAG:  Other low back pain  Radiculopathy, lumbar region  Muscle weakness (generalized)  ONSET DATE: Chronic.  SUBJECTIVE:  SUBJECTIVE STATEMENT:  The patient reports a pain-level of a 7/10 today but has occasions of severe pain.  She has a sensation of her back popping in and  out (referring to the SIJ regions).   Had injections , but no relief.  PERTINENT HISTORY:  See above  Lumbar L4-L5 fusion, laminectomy Removal of cyst  PAIN:  Are you having pain? Yes: NPRS scale: 7/10 Pain location: Bilateral SIJ's. Pain description: Sharp. Aggravating factors: Standing, walking.  Sitting without good lumbar support. Relieving factors: Nothing.  PRECAUTIONS: Fall.  Recommend cane usage.  WEIGHT BEARING RESTRICTIONS: No  FALLS:  Has patient fallen in  last 6 months? Yes. Number of falls 3.  Occurred when pain hit (back popping out).  LIVING ENVIRONMENT: Lives with: lives with their family Lives in: House/apartment Has following equipment at home: None  OCCUPATION: Currently out of work.  PLOF:  Prior to onset of pain she was independent.  PATIENT GOALS: Not have this pain.  Not have back give out.  Get quality of life back.   OBJECTIVE:     Date 08-01-23                                    EXERCISE LOG    Bil SIJ   Exercise Repetitions and Resistance Comments  AB bracing X 10 x 5 secs hold   Sit to stand X10 with AB bracing   Drawin X 10 hold 5 secs            Blank cell = exercise not performed today  Discussed pain triggers and AB bracing to decrease pain triggers and stability. Also discussed SIJ belt for stability as well. Practiced AB bracing with in/out of bed transition   SCREENING FOR RED FLAGS: No.   SENSATION: C/o left foot numbness.  POSTURE: rounded shoulders, forward head, and decreased lumbar lordosis  PALPATION: Very tender to palpation over bilateral SIJ's.  Also c/o tenderness over lumbar incisional site.  LUMBAR ROM:   Active lumbar flexion limited by 50% and extension limited to 10 degrees.  LOWER EXTREMITY ROM:     WFL.  LOWER EXTREMITY MMT:    Normal bilateral knee and ankle strength.  LUMBAR SPECIAL TESTS:  Equal leg lengths. Deferred SIJ testing due to high pain-level in these areas (SIJ's).   GAIT:/TRANSFERS: Very antalgic in nature.  Transitory movements are also performed with patient in obvious pain.     PATIENT EDUCATION:  Education details: Discussed plan for spinal stabilization progression, BM and spinal protection techniques. Person educated: Patient Education method: Explanation Education comprehension: verbalized understanding  HOME EXERCISE PROGRAM:   ASSESSMENT:  CLINICAL IMPRESSION: Pt arrived today reporting injections didn't really help. Not doing well with  high pain levels both sides SIJ. Rx focused on AB bracing with transitional movements and ADL's. Core activation with drawin  and AB bracing were practiced with IN/out of bed and sit to stand. Pt did fair with Rx, but continued to have increased pain at times during Rx. Pt to bring in Her SIJ belt next visit.      OBJECTIVE IMPAIRMENTS: Abnormal gait, decreased activity tolerance, decreased mobility, difficulty walking, decreased ROM, increased muscle spasms, postural dysfunction, and pain.   ACTIVITY LIMITATIONS: carrying, lifting, bending, sitting, standing, squatting, sleeping, stairs, transfers, bed mobility, locomotion level, and caring for others  PARTICIPATION LIMITATIONS: meal prep, cleaning, laundry, shopping, community activity, and yard work  PERSONAL FACTORS:  Time since onset of injury/illness/exacerbation and 1 comorbidity: previous back surgery  are also affecting patient's functional outcome.   REHAB POTENTIAL: Good+.  CLINICAL DECISION MAKING: Evolving/moderate complexity  EVALUATION COMPLEXITY: Moderate   GOALS:  LONG TERM GOALS: Target date: 09/04/23.  Ind with a HEP. Baseline:  Goal status: INITIAL  2.  Patient perform transitory movements (ie:  sit to stand, supine to sit) with pain not > 3-4/10.  Goal status: INITIAL  3.  Walk a community distance with pain not > 3-4/10.  Goal status: INITIAL  4.  Perform ADL's with pain not > 3-4/10.  Goal status: INITIAL  PLAN:  PT FREQUENCY: 2x/week  PT DURATION: 6 weeks  PLANNED INTERVENTIONS: Therapeutic exercises, Therapeutic activity, Neuromuscular re-education, Balance training, Gait training, Patient/Family education, Self Care, Dry Needling, Electrical stimulation, Cryotherapy, Moist heat, Ultrasound, and Manual therapy.  PLAN FOR NEXT SESSION: Core exercise progression, spinal protection techniques and body mechanics training.   Tashawnda Bleiler,CHRIS, PTA 08/01/2023, 2:15 PM

## 2023-08-03 ENCOUNTER — Ambulatory Visit: Payer: 59 | Admitting: *Deleted

## 2023-08-03 DIAGNOSIS — M5459 Other low back pain: Secondary | ICD-10-CM

## 2023-08-03 DIAGNOSIS — M6281 Muscle weakness (generalized): Secondary | ICD-10-CM

## 2023-08-03 DIAGNOSIS — M5416 Radiculopathy, lumbar region: Secondary | ICD-10-CM | POA: Diagnosis not present

## 2023-08-03 NOTE — Therapy (Signed)
OUTPATIENT PHYSICAL THERAPY THORACOLUMBAR TREATMENT   Patient Name: Amy Adams MRN: [id number] DOB:10-08-1966, 57 y.o., female Today's Date: 08/03/2023  END OF SESSION:  PT End of Session - 08/03/23 1311     Visit Number 3    Number of Visits 12    Date for PT Re-Evaluation 09/04/23    Authorization Type [facility] [employer]    PT Start Time 1300    PT Stop Time 1346    PT Time Calculation (min) 46 min             Past Medical History:  Diagnosis Date   Anxiety    Arthritis    Chronic foot pain, left    GERD (gastroesophageal reflux disease)    History of anal fissures    Hyperlipidemia    Hypertension    Hypothyroidism    Insulin dependent type 2 diabetes mellitus (HCC)    endrocrinologist-- dr Talmage Nap   Migraines    Peripheral neuropathy    RA (rheumatoid arthritis) [facility])    rheumatologist-  dr Dierdre Forth   SUI (stress urinary incontinence, female)    Wears glasses    Past Surgical History:  Procedure Laterality Date   ABDOMINAL HYSTERECTOMY  1990   with Right Salpingo--ophorectomy   ANAL SPHINCTEROTOMY  01-05-2006    dr Zachery Dakins @[facility]    BUNIONECTOMY  09-25-2009   dr Al Corpus @[facility]    right great toe   CARPAL TUNNEL RELEASE Bilateral 1996;  1997   CESAREAN SECTION  1988   FOOT SURGERY  12/ 2014   dr Al Corpus   left 2nd hammertoe repair,  left 2nd metatarsal matrixectomy, left heel endoscopic plantar fasiotomy   IR CATHETER TUBE CHANGE  01/01/2019   IR RADIOLOGIST EVAL & MGMT  11/28/2018   IR RADIOLOGIST EVAL & MGMT  11/29/2018   IR RADIOLOGIST EVAL & MGMT  12/06/2018   IR RADIOLOGIST EVAL & MGMT  12/25/2018   IR RADIOLOGIST EVAL & MGMT  01/08/2019   IR RADIOLOGIST EVAL & MGMT  01/29/2019   PANNICULECTOMY N/A 10/26/2018   Procedure: PANNICULECTOMY;  Surgeon: Luretha Murphy, MD;  Location: [facility];  Service: General;  Laterality: N/A;   TENDON REPAIR  09/2015   left posterior tibial and peroneal tendon repairs   TOE AMPUTATION  01/2015   right 2nd toe    TONSILLECTOMY  age 37   TRANSOBTURATOR SLING  01-08-2004   dr Annabell Howells @[facility]    Patient Active Problem List   Diagnosis Date Noted   Degeneration of lumbar intervertebral disc 03/10/2022   Generalized psoriasis 03/10/2022   Other long term (current) drug therapy 03/10/2022   Overweight 03/10/2022   Pain in limb 03/10/2022   Primary osteoarthritis 03/10/2022   Synovial cyst 03/10/2022   S/P panniculectomy 10/26/2018   Panniculitis 09/04/2018   HYPOTHYROIDISM 12/01/2010   Type 2 diabetes mellitus with diabetic polyneuropathy, with long-term current use of insulin (HCC) 12/01/2010   DIAB W/NEURO MANIFESTS TYPE II/UNS NOT UNCNTRL 12/01/2010   OTHER AND UNSPECIFIED HYPERLIPIDEMIA 12/01/2010   OBESITY, UNSPECIFIED 12/01/2010   ESSENTIAL HYPERTENSION, BENIGN 12/01/2010   Rheumatoid arthritis (HCC) 12/01/2010    REFERRING PROVIDER: Kendell Bane Dawley DO.  REFERRING DIAG: Bilateral Sacroiliitis   Rationale for Evaluation and Treatment: Rehabilitation  THERAPY DIAG:  Other low back pain  Radiculopathy, lumbar region  Muscle weakness (generalized)  ONSET DATE: Chronic.  SUBJECTIVE:  SUBJECTIVE STATEMENT:  Back to MD 08-07-23 about feed back after  injections. Doing about the same  PERTINENT HISTORY:  See above  Lumbar L4-L5 fusion, laminectomy Removal of cyst  PAIN:  Are you having pain? Yes: NPRS scale: 7/10 Pain location: Bilateral SIJ's. Pain description: Sharp. Aggravating factors: Standing, walking.  Sitting without good lumbar support. Relieving factors: Nothing.  PRECAUTIONS: Fall.  Recommend cane usage.  WEIGHT BEARING RESTRICTIONS: No  FALLS:  Has patient fallen in last 6 months? Yes. Number of falls 3.  Occurred when pain hit (back popping out).  LIVING ENVIRONMENT: Lives with: lives  with their family Lives in: House/apartment Has following equipment at home: None  OCCUPATION: Currently out of work.  PLOF:  Prior to onset of pain she was independent.  PATIENT GOALS: Not have this pain.  Not have back give out.  Get quality of life back.   OBJECTIVE:     Date 08-03-23                                    EXERCISE LOG    Bil SIJ   Exercise Repetitions and Resistance Comments  AB bracing X 10 x 5 secs hold   Sit to stand    Drawin X 10 hold 5 secs   Hooklying bent knee raise X3 each side  DC due to pain        Blank cell = exercise not performed today  Discussed pain triggers and AB bracing to decrease pain triggers and stability again. Pt brought in SIJ belt for stability to try stability exs     SCREENING FOR RED FLAGS: No.   SENSATION: C/o left foot numbness.  POSTURE: rounded shoulders, forward head, and decreased lumbar lordosis  PALPATION: Very tender to palpation over bilateral SIJ's.  Also c/o tenderness over lumbar incisional site.  LUMBAR ROM:   Active lumbar flexion limited by 50% and extension limited to 10 degrees.  LOWER EXTREMITY ROM:     WFL.  LOWER EXTREMITY MMT:    Normal bilateral knee and ankle strength.  LUMBAR SPECIAL TESTS:  Equal leg lengths. Deferred SIJ testing due to high pain-level in these areas (SIJ's).   GAIT:/TRANSFERS: Very antalgic in nature.  Transitory movements are also performed with patient in obvious pain.     PATIENT EDUCATION:  Education details: Discussed plan for spinal stabilization progression, BM and spinal protection techniques. Person educated: Patient Education method: Explanation Education comprehension: verbalized understanding  HOME EXERCISE PROGRAM:   ASSESSMENT:  CLINICAL IMPRESSION: Pt arrived today doing about the same with Bil. SIJ pain. She was asked to bring in her SIJ belt today to help facilitate stability during core activation exs. Pt was still unable to perform any hook  lying stability witout increased pain and had to discontinue. [place] combo  and STW performed to Bil SIJ's to decrease pain.  Pt to try SIJ during the day during ADL's to decrease pain triggers.      OBJECTIVE IMPAIRMENTS: Abnormal gait, decreased activity tolerance, decreased mobility, difficulty walking, decreased ROM, increased muscle spasms, postural dysfunction, and pain.   ACTIVITY LIMITATIONS: carrying, lifting, bending, sitting, standing, squatting, sleeping, stairs, transfers, bed mobility, locomotion level, and caring for others  PARTICIPATION LIMITATIONS: meal prep, cleaning, laundry, shopping, community activity, and yard work  PERSONAL FACTORS: Time since onset of injury/illness/exacerbation and 1 comorbidity: previous back surgery  are also affecting patient's functional outcome.  REHAB POTENTIAL: Good+.  CLINICAL DECISION MAKING: Evolving/moderate complexity  EVALUATION COMPLEXITY: Moderate   GOALS:  LONG TERM GOALS: Target date: 09/04/23.  Ind with a HEP. Baseline:  Goal status: INITIAL  2.  Patient perform transitory movements (ie:  sit to stand, supine to sit) with pain not > 3-4/10.  Goal status: INITIAL  3.  Walk a community distance with pain not > 3-4/10.  Goal status: INITIAL  4.  Perform ADL's with pain not > 3-4/10.  Goal status: INITIAL  PLAN:  PT FREQUENCY: 2x/week  PT DURATION: 6 weeks  PLANNED INTERVENTIONS: Therapeutic exercises, Therapeutic activity, Neuromuscular re-education, Balance training, Gait training, Patient/Family education, Self Care, Dry Needling, Electrical stimulation, Cryotherapy, Moist heat, Ultrasound, and Manual therapy.  PLAN FOR NEXT SESSION: Core exercise progression, spinal protection techniques and body mechanics training.   Farrie Sann,CHRIS, PTA 08/03/2023, 6:01 PM

## 2023-08-07 DIAGNOSIS — Z6830 Body mass index (BMI) 30.0-30.9, adult: Secondary | ICD-10-CM | POA: Diagnosis not present

## 2023-08-07 DIAGNOSIS — M461 Sacroiliitis, not elsewhere classified: Secondary | ICD-10-CM | POA: Diagnosis not present

## 2023-08-08 ENCOUNTER — Ambulatory Visit: Payer: 59 | Admitting: *Deleted

## 2023-08-08 DIAGNOSIS — M6281 Muscle weakness (generalized): Secondary | ICD-10-CM | POA: Diagnosis not present

## 2023-08-08 DIAGNOSIS — M5416 Radiculopathy, lumbar region: Secondary | ICD-10-CM | POA: Diagnosis not present

## 2023-08-08 DIAGNOSIS — M5459 Other low back pain: Secondary | ICD-10-CM

## 2023-08-08 NOTE — Therapy (Signed)
OUTPATIENT PHYSICAL THERAPY THORACOLUMBAR TREATMENT   Patient Name: Amy Adams MRN: [id number] DOB:1966-10-22, 57 y.o., female Today's Date: 08/08/2023  END OF SESSION:  PT End of Session - 08/08/23 1532     Visit Number 4    Number of Visits 12    Date for PT Re-Evaluation 09/04/23    Authorization Type [facility] [employer]    PT Start Time 1515    PT Stop Time 1603    PT Time Calculation (min) 48 min             Past Medical History:  Diagnosis Date   Anxiety    Arthritis    Chronic foot pain, left    GERD (gastroesophageal reflux disease)    History of anal fissures    Hyperlipidemia    Hypertension    Hypothyroidism    Insulin dependent type 2 diabetes mellitus (HCC)    endrocrinologist-- dr Talmage Nap   Migraines    Peripheral neuropathy    RA (rheumatoid arthritis) [facility])    rheumatologist-  dr Dierdre Forth   SUI (stress urinary incontinence, female)    Wears glasses    Past Surgical History:  Procedure Laterality Date   ABDOMINAL HYSTERECTOMY  1990   with Right Salpingo--ophorectomy   ANAL SPHINCTEROTOMY  01-05-2006    dr Zachery Dakins @[facility]    BUNIONECTOMY  09-25-2009   dr Al Corpus @[facility]    right great toe   CARPAL TUNNEL RELEASE Bilateral 1996;  1997   CESAREAN SECTION  1988   FOOT SURGERY  12/ 2014   dr Al Corpus   left 2nd hammertoe repair,  left 2nd metatarsal matrixectomy, left heel endoscopic plantar fasiotomy   IR CATHETER TUBE CHANGE  01/01/2019   IR RADIOLOGIST EVAL & MGMT  11/28/2018   IR RADIOLOGIST EVAL & MGMT  11/29/2018   IR RADIOLOGIST EVAL & MGMT  12/06/2018   IR RADIOLOGIST EVAL & MGMT  12/25/2018   IR RADIOLOGIST EVAL & MGMT  01/08/2019   IR RADIOLOGIST EVAL & MGMT  01/29/2019   PANNICULECTOMY N/A 10/26/2018   Procedure: PANNICULECTOMY;  Surgeon: Luretha Murphy, MD;  Location: [facility];  Service: General;  Laterality: N/A;   TENDON REPAIR  09/2015   left posterior tibial and peroneal tendon repairs   TOE AMPUTATION  01/2015   right 2nd toe    TONSILLECTOMY  age 8   TRANSOBTURATOR SLING  01-08-2004   dr Annabell Howells @[facility]    Patient Active Problem List   Diagnosis Date Noted   Degeneration of lumbar intervertebral disc 03/10/2022   Generalized psoriasis 03/10/2022   Other long term (current) drug therapy 03/10/2022   Overweight 03/10/2022   Pain in limb 03/10/2022   Primary osteoarthritis 03/10/2022   Synovial cyst 03/10/2022   S/P panniculectomy 10/26/2018   Panniculitis 09/04/2018   HYPOTHYROIDISM 12/01/2010   Type 2 diabetes mellitus with diabetic polyneuropathy, with long-term current use of insulin (HCC) 12/01/2010   DIAB W/NEURO MANIFESTS TYPE II/UNS NOT UNCNTRL 12/01/2010   OTHER AND UNSPECIFIED HYPERLIPIDEMIA 12/01/2010   OBESITY, UNSPECIFIED 12/01/2010   ESSENTIAL HYPERTENSION, BENIGN 12/01/2010   Rheumatoid arthritis (HCC) 12/01/2010    REFERRING PROVIDER: Kendell Bane Dawley DO.  REFERRING DIAG: Bilateral Sacroiliitis   Rationale for Evaluation and Treatment: Rehabilitation  THERAPY DIAG:  Other low back pain  Radiculopathy, lumbar region  Muscle weakness (generalized)  ONSET DATE: Chronic.  SUBJECTIVE:  SUBJECTIVE STATEMENT:  Back to MD 08-07-23 about feed back after  injections. Doing about the same  PERTINENT HISTORY:  See above  Lumbar L4-L5 fusion, laminectomy Removal of cyst  PAIN:  Are you having pain? Yes: NPRS scale: 7/10 Pain location: Bilateral SIJ's. Pain description: Sharp. Aggravating factors: Standing, walking.  Sitting without good lumbar support. Relieving factors: Nothing.  PRECAUTIONS: Fall.  Recommend cane usage.  WEIGHT BEARING RESTRICTIONS: No  FALLS:  Has patient fallen in last 6 months? Yes. Number of falls 3.  Occurred when pain hit (back popping out).  LIVING ENVIRONMENT: Lives with: lives  with their family Lives in: House/apartment Has following equipment at home: None  OCCUPATION: Currently out of work.  PLOF:  Prior to onset of pain she was independent.  PATIENT GOALS: Not have this pain.  Not have back give out.  Get quality of life back.   OBJECTIVE:     Date 08-08-23                                    EXERCISE LOG    Bil SIJ   Exercise Repetitions and Resistance Comments  AB bracing X 10 x 5 secs hold   Sit to stand    Drawin    Hooklying bent knee raise    ROWs red tband 3x10   Ext red tband 3x10   Press red tband 3x10    Blank cell = exercise not performed today    [place] estim combo x 12 mins to Bil SIJ's while seated Manual STW to Bil. SIJs   seated  SCREENING FOR RED FLAGS: No.   SENSATION: C/o left foot numbness.  POSTURE: rounded shoulders, forward head, and decreased lumbar lordosis  PALPATION: Very tender to palpation over bilateral SIJ's.  Also c/o tenderness over lumbar incisional site.  LUMBAR ROM:   Active lumbar flexion limited by 50% and extension limited to 10 degrees.  LOWER EXTREMITY ROM:     WFL.  LOWER EXTREMITY MMT:    Normal bilateral knee and ankle strength.  LUMBAR SPECIAL TESTS:  Equal leg lengths. Deferred SIJ testing due to high pain-level in these areas (SIJ's).   GAIT:/TRANSFERS: Very antalgic in nature.  Transitory movements are also performed with patient in obvious pain.     PATIENT EDUCATION:  Education details: Discussed plan for spinal stabilization progression, BM and spinal protection techniques. Person educated: Patient Education method: Explanation Education comprehension: verbalized understanding  HOME EXERCISE PROGRAM:   ASSESSMENT:  CLINICAL IMPRESSION: Pt arrived today doing about the same with Bil. SIJ pain. Rx focused on standing core strengthening and stability  exs with RED tband with rows and shldr extensions as well as chest press and did fairly well.  [place] combo  and STW performed to  Bil SIJ's to decrease pain.        OBJECTIVE IMPAIRMENTS: Abnormal gait, decreased activity tolerance, decreased mobility, difficulty walking, decreased ROM, increased muscle spasms, postural dysfunction, and pain.   ACTIVITY LIMITATIONS: carrying, lifting, bending, sitting, standing, squatting, sleeping, stairs, transfers, bed mobility, locomotion level, and caring for others  PARTICIPATION LIMITATIONS: meal prep, cleaning, laundry, shopping, community activity, and yard work  PERSONAL FACTORS: Time since onset of injury/illness/exacerbation and 1 comorbidity: previous back surgery  are also affecting patient's functional outcome.   REHAB POTENTIAL: Good+.  CLINICAL DECISION MAKING: Evolving/moderate complexity  EVALUATION COMPLEXITY: Moderate   GOALS:  LONG TERM GOALS: Target date:  09/04/23.  Ind with a HEP. Baseline:  Goal status: INITIAL  2.  Patient perform transitory movements (ie:  sit to stand, supine to sit) with pain not > 3-4/10.  Goal status: INITIAL  3.  Walk a community distance with pain not > 3-4/10.  Goal status: INITIAL  4.  Perform ADL's with pain not > 3-4/10.  Goal status: INITIAL  PLAN:  PT FREQUENCY: 2x/week  PT DURATION: 6 weeks  PLANNED INTERVENTIONS: Therapeutic exercises, Therapeutic activity, Neuromuscular re-education, Balance training, Gait training, Patient/Family education, Self Care, Dry Needling, Electrical stimulation, Cryotherapy, Moist heat, Ultrasound, and Manual therapy.  PLAN FOR NEXT SESSION: Core exercise progression, spinal protection techniques and body mechanics training.   Imer Foxworth,CHRIS, PTA 08/08/2023, 5:57 PM

## 2023-08-09 DIAGNOSIS — L401 Generalized pustular psoriasis: Secondary | ICD-10-CM | POA: Diagnosis not present

## 2023-08-09 DIAGNOSIS — M5136 Other intervertebral disc degeneration, lumbar region: Secondary | ICD-10-CM | POA: Diagnosis not present

## 2023-08-09 DIAGNOSIS — M79644 Pain in right finger(s): Secondary | ICD-10-CM | POA: Diagnosis not present

## 2023-08-09 DIAGNOSIS — E663 Overweight: Secondary | ICD-10-CM | POA: Diagnosis not present

## 2023-08-09 DIAGNOSIS — M0589 Other rheumatoid arthritis with rheumatoid factor of multiple sites: Secondary | ICD-10-CM | POA: Diagnosis not present

## 2023-08-09 DIAGNOSIS — N183 Chronic kidney disease, stage 3 unspecified: Secondary | ICD-10-CM | POA: Diagnosis not present

## 2023-08-09 DIAGNOSIS — M1991 Primary osteoarthritis, unspecified site: Secondary | ICD-10-CM | POA: Diagnosis not present

## 2023-08-09 DIAGNOSIS — Z6829 Body mass index (BMI) 29.0-29.9, adult: Secondary | ICD-10-CM | POA: Diagnosis not present

## 2023-08-09 DIAGNOSIS — Z79899 Other long term (current) drug therapy: Secondary | ICD-10-CM | POA: Diagnosis not present

## 2023-08-10 ENCOUNTER — Encounter: Payer: 59 | Admitting: *Deleted

## 2023-08-15 ENCOUNTER — Encounter: Payer: Self-pay | Admitting: *Deleted

## 2023-08-15 ENCOUNTER — Ambulatory Visit: Payer: 59 | Attending: Neurological Surgery | Admitting: *Deleted

## 2023-08-15 DIAGNOSIS — M6281 Muscle weakness (generalized): Secondary | ICD-10-CM | POA: Insufficient documentation

## 2023-08-15 DIAGNOSIS — M5459 Other low back pain: Secondary | ICD-10-CM | POA: Insufficient documentation

## 2023-08-15 DIAGNOSIS — M5416 Radiculopathy, lumbar region: Secondary | ICD-10-CM | POA: Insufficient documentation

## 2023-08-15 NOTE — Therapy (Signed)
OUTPATIENT PHYSICAL THERAPY THORACOLUMBAR TREATMENT   Patient Name: Amy Adams MRN: [id number] DOB:18-Nov-1965, 57 y.o., female Today's Date: 08/15/2023  END OF SESSION:  PT End of Session - 08/15/23 1308     Visit Number 5    Number of Visits 12    Date for PT Re-Evaluation 09/04/23    Authorization Type [facility] [employer]    PT Start Time 1305    PT Stop Time 1348    PT Time Calculation (min) 43 min             Past Medical History:  Diagnosis Date   Anxiety    Arthritis    Chronic foot pain, left    GERD (gastroesophageal reflux disease)    History of anal fissures    Hyperlipidemia    Hypertension    Hypothyroidism    Insulin dependent type 2 diabetes mellitus (HCC)    endrocrinologist-- dr Talmage Nap   Migraines    Peripheral neuropathy    RA (rheumatoid arthritis) [facility])    rheumatologist-  dr Dierdre Forth   SUI (stress urinary incontinence, female)    Wears glasses    Past Surgical History:  Procedure Laterality Date   ABDOMINAL HYSTERECTOMY  1990   with Right Salpingo--ophorectomy   ANAL SPHINCTEROTOMY  01-05-2006    dr Zachery Dakins @[facility]    BUNIONECTOMY  09-25-2009   dr Al Corpus @[facility]    right great toe   CARPAL TUNNEL RELEASE Bilateral 1996;  1997   CESAREAN SECTION  1988   FOOT SURGERY  12/ 2014   dr Al Corpus   left 2nd hammertoe repair,  left 2nd metatarsal matrixectomy, left heel endoscopic plantar fasiotomy   IR CATHETER TUBE CHANGE  01/01/2019   IR RADIOLOGIST EVAL & MGMT  11/28/2018   IR RADIOLOGIST EVAL & MGMT  11/29/2018   IR RADIOLOGIST EVAL & MGMT  12/06/2018   IR RADIOLOGIST EVAL & MGMT  12/25/2018   IR RADIOLOGIST EVAL & MGMT  01/08/2019   IR RADIOLOGIST EVAL & MGMT  01/29/2019   PANNICULECTOMY N/A 10/26/2018   Procedure: PANNICULECTOMY;  Surgeon: Luretha Murphy, MD;  Location: [facility];  Service: General;  Laterality: N/A;   TENDON REPAIR  09/2015   left posterior tibial and peroneal tendon repairs   TOE AMPUTATION  01/2015   right 2nd toe    TONSILLECTOMY  age 10   TRANSOBTURATOR SLING  01-08-2004   dr Annabell Howells @[facility]    Patient Active Problem List   Diagnosis Date Noted   Degeneration of lumbar intervertebral disc 03/10/2022   Generalized psoriasis 03/10/2022   Other long term (current) drug therapy 03/10/2022   Overweight 03/10/2022   Pain in limb 03/10/2022   Primary osteoarthritis 03/10/2022   Synovial cyst 03/10/2022   S/P panniculectomy 10/26/2018   Panniculitis 09/04/2018   HYPOTHYROIDISM 12/01/2010   Type 2 diabetes mellitus with diabetic polyneuropathy, with long-term current use of insulin (HCC) 12/01/2010   DIAB W/NEURO MANIFESTS TYPE II/UNS NOT UNCNTRL 12/01/2010   OTHER AND UNSPECIFIED HYPERLIPIDEMIA 12/01/2010   OBESITY, UNSPECIFIED 12/01/2010   ESSENTIAL HYPERTENSION, BENIGN 12/01/2010   Rheumatoid arthritis (HCC) 12/01/2010    REFERRING PROVIDER: Kendell Bane Dawley DO.  REFERRING DIAG: Bilateral Sacroiliitis   Rationale for Evaluation and Treatment: Rehabilitation  THERAPY DIAG:  Other low back pain  Radiculopathy, lumbar region  Muscle weakness (generalized)  ONSET DATE: Chronic.  SUBJECTIVE:  SUBJECTIVE STATEMENT:  Back to MD 08-07-23. Doing better today with less pain. The SIJ's feel in neutral today  PERTINENT HISTORY:  See above  Lumbar L4-L5 fusion, laminectomy Removal of cyst  PAIN:  Are you having pain? Yes: NPRS scale: 7/10 Pain location: Bilateral SIJ's. Pain description: Sharp. Aggravating factors: Standing, walking.  Sitting without good lumbar support. Relieving factors: Nothing.  PRECAUTIONS: Fall.  Recommend cane usage.  WEIGHT BEARING RESTRICTIONS: No  FALLS:  Has patient fallen in last 6 months? Yes. Number of falls 3.  Occurred when pain hit (back popping out).  LIVING ENVIRONMENT: Lives  with: lives with their family Lives in: House/apartment Has following equipment at home: None  OCCUPATION: Currently out of work.  PLOF:  Prior to onset of pain she was independent.  PATIENT GOALS: Not have this pain.  Not have back give out.  Get quality of life back.   OBJECTIVE:     Date 08/15/23                                    EXERCISE LOG    Bil SIJ   Exercise Repetitions and Resistance Comments  AB bracing X 10 x 5 secs hold   Sit to stand    Drawin    Hooklying bent knee raise    ROWs red tband 3x10   Ext red tband 3x10   Press red tband 3x10    Blank cell = exercise not performed today    [place] estim combo x 12 mins to Bil SIJ's while seated Manual STW to Bil. SIJs   seated  x 12 mins  SCREENING FOR RED FLAGS: No.   SENSATION: C/o left foot numbness.  POSTURE: rounded shoulders, forward head, and decreased lumbar lordosis  PALPATION: Very tender to palpation over bilateral SIJ's.  Also c/o tenderness over lumbar incisional site.  LUMBAR ROM:   Active lumbar flexion limited by 50% and extension limited to 10 degrees.  LOWER EXTREMITY ROM:     WFL.  LOWER EXTREMITY MMT:    Normal bilateral knee and ankle strength.  LUMBAR SPECIAL TESTS:  Equal leg lengths. Deferred SIJ testing due to high pain-level in these areas (SIJ's).   GAIT:/TRANSFERS: Very antalgic in nature.  Transitory movements are also performed with patient in obvious pain.     PATIENT EDUCATION:  Education details: Discussed plan for spinal stabilization progression, BM and spinal protection techniques. Person educated: Patient Education method: Explanation Education comprehension: verbalized understanding  HOME EXERCISE PROGRAM:   ASSESSMENT:  CLINICAL IMPRESSION: Pt arrived today doing better with less pain Bil. SIJ pain. Rx focused on standing core strengthening and stability  exs again with RED tband with rows and shldr extensions as well as chest press.  [place] combo  and STW  performed to Bil SIJ's to decrease while seated.        OBJECTIVE IMPAIRMENTS: Abnormal gait, decreased activity tolerance, decreased mobility, difficulty walking, decreased ROM, increased muscle spasms, postural dysfunction, and pain.   ACTIVITY LIMITATIONS: carrying, lifting, bending, sitting, standing, squatting, sleeping, stairs, transfers, bed mobility, locomotion level, and caring for others  PARTICIPATION LIMITATIONS: meal prep, cleaning, laundry, shopping, community activity, and yard work  PERSONAL FACTORS: Time since onset of injury/illness/exacerbation and 1 comorbidity: previous back surgery  are also affecting patient's functional outcome.   REHAB POTENTIAL: Good+.  CLINICAL DECISION MAKING: Evolving/moderate complexity  EVALUATION COMPLEXITY: Moderate   GOALS:  LONG  TERM GOALS: Target date: 09/04/23.  Ind with a HEP. Baseline:  Goal status: Partially met  2.  Patient perform transitory movements (ie:  sit to stand, supine to sit) with pain not > 3-4/10.  Goal status: On going  3.  Walk a community distance with pain not > 3-4/10.  Goal status: On going  4.  Perform ADL's with pain not > 3-4/10.  Goal status: On going  PLAN:  PT FREQUENCY: 2x/week  PT DURATION: 6 weeks  PLANNED INTERVENTIONS: Therapeutic exercises, Therapeutic activity, Neuromuscular re-education, Balance training, Gait training, Patient/Family education, Self Care, Dry Needling, Electrical stimulation, Cryotherapy, Moist heat, Ultrasound, and Manual therapy.  PLAN FOR NEXT SESSION: Core exercise progression, spinal protection techniques and body mechanics training.   Lori Liew,CHRIS, PTA 08/15/2023, 1:57 PM

## 2023-08-16 DIAGNOSIS — E1165 Type 2 diabetes mellitus with hyperglycemia: Secondary | ICD-10-CM | POA: Diagnosis not present

## 2023-08-21 ENCOUNTER — Other Ambulatory Visit (HOSPITAL_COMMUNITY): Payer: Self-pay

## 2023-08-21 DIAGNOSIS — T148XXA Other injury of unspecified body region, initial encounter: Secondary | ICD-10-CM | POA: Diagnosis not present

## 2023-08-21 DIAGNOSIS — E1165 Type 2 diabetes mellitus with hyperglycemia: Secondary | ICD-10-CM | POA: Diagnosis not present

## 2023-08-21 DIAGNOSIS — E039 Hypothyroidism, unspecified: Secondary | ICD-10-CM | POA: Diagnosis not present

## 2023-08-21 DIAGNOSIS — E7800 Pure hypercholesterolemia, unspecified: Secondary | ICD-10-CM | POA: Diagnosis not present

## 2023-08-21 DIAGNOSIS — I10 Essential (primary) hypertension: Secondary | ICD-10-CM | POA: Diagnosis not present

## 2023-08-21 MED ORDER — INSULIN LISPRO (1 UNIT DIAL) 100 UNIT/ML (KWIKPEN)
10.0000 [IU] | PEN_INJECTOR | Freq: Three times a day (TID) | SUBCUTANEOUS | 5 refills | Status: DC
  Filled 2023-08-21: qty 39, 87d supply, fill #0

## 2023-08-21 MED ORDER — LANTUS SOLOSTAR 100 UNIT/ML ~~LOC~~ SOPN
40.0000 [IU] | PEN_INJECTOR | Freq: Two times a day (BID) | SUBCUTANEOUS | 5 refills | Status: DC
  Filled 2023-08-21: qty 72, 90d supply, fill #0

## 2023-08-21 MED ORDER — METFORMIN HCL ER 500 MG PO TB24
1000.0000 mg | ORAL_TABLET | Freq: Two times a day (BID) | ORAL | 5 refills | Status: DC
  Filled 2023-08-21: qty 120, 30d supply, fill #0

## 2023-08-21 MED ORDER — LEVOTHYROXINE SODIUM 50 MCG PO TABS
50.0000 ug | ORAL_TABLET | Freq: Every morning | ORAL | 5 refills | Status: DC
  Filled 2023-08-21: qty 30, 30d supply, fill #0
  Filled 2023-10-06: qty 30, 30d supply, fill #1
  Filled 2023-11-04: qty 30, 30d supply, fill #2

## 2023-08-21 MED ORDER — DEXCOM G7 SENSOR MISC
11 refills | Status: DC
  Filled 2023-08-21: qty 3, 30d supply, fill #0
  Filled 2023-09-26: qty 3, 30d supply, fill #1
  Filled 2023-10-19 – 2023-10-23 (×2): qty 3, 30d supply, fill #2
  Filled 2023-11-28: qty 3, 30d supply, fill #3

## 2023-08-22 ENCOUNTER — Ambulatory Visit: Payer: 59 | Admitting: *Deleted

## 2023-08-22 DIAGNOSIS — M5459 Other low back pain: Secondary | ICD-10-CM

## 2023-08-22 DIAGNOSIS — M5416 Radiculopathy, lumbar region: Secondary | ICD-10-CM

## 2023-08-22 DIAGNOSIS — M6281 Muscle weakness (generalized): Secondary | ICD-10-CM | POA: Diagnosis not present

## 2023-08-22 NOTE — Therapy (Signed)
OUTPATIENT PHYSICAL THERAPY THORACOLUMBAR TREATMENT   Patient Name: Amy Adams MRN: [id number] DOB:07-14-1966, 57 y.o., female Today's Date: 08/22/2023  END OF SESSION:  PT End of Session - 08/22/23 1255     Visit Number 6    Number of Visits 12    Date for PT Re-Evaluation 09/04/23    Authorization Type [facility] [employer]    PT Start Time 1300    PT Stop Time 1350    PT Time Calculation (min) 50 min             Past Medical History:  Diagnosis Date   Anxiety    Arthritis    Chronic foot pain, left    GERD (gastroesophageal reflux disease)    History of anal fissures    Hyperlipidemia    Hypertension    Hypothyroidism    Insulin dependent type 2 diabetes mellitus (HCC)    endrocrinologist-- dr Talmage Nap   Migraines    Peripheral neuropathy    RA (rheumatoid arthritis) [facility])    rheumatologist-  dr Dierdre Forth   SUI (stress urinary incontinence, female)    Wears glasses    Past Surgical History:  Procedure Laterality Date   ABDOMINAL HYSTERECTOMY  1990   with Right Salpingo--ophorectomy   ANAL SPHINCTEROTOMY  01-05-2006    dr Zachery Dakins @[facility]    BUNIONECTOMY  09-25-2009   dr Al Corpus @[facility]    right great toe   CARPAL TUNNEL RELEASE Bilateral 1996;  1997   CESAREAN SECTION  1988   FOOT SURGERY  12/ 2014   dr Al Corpus   left 2nd hammertoe repair,  left 2nd metatarsal matrixectomy, left heel endoscopic plantar fasiotomy   IR CATHETER TUBE CHANGE  01/01/2019   IR RADIOLOGIST EVAL & MGMT  11/28/2018   IR RADIOLOGIST EVAL & MGMT  11/29/2018   IR RADIOLOGIST EVAL & MGMT  12/06/2018   IR RADIOLOGIST EVAL & MGMT  12/25/2018   IR RADIOLOGIST EVAL & MGMT  01/08/2019   IR RADIOLOGIST EVAL & MGMT  01/29/2019   PANNICULECTOMY N/A 10/26/2018   Procedure: PANNICULECTOMY;  Surgeon: Luretha Murphy, MD;  Location: [facility];  Service: General;  Laterality: N/A;   TENDON REPAIR  09/2015   left posterior tibial and peroneal tendon repairs   TOE AMPUTATION  01/2015   right 2nd toe    TONSILLECTOMY  age 6   TRANSOBTURATOR SLING  01-08-2004   dr Annabell Howells @[facility]    Patient Active Problem List   Diagnosis Date Noted   Degeneration of lumbar intervertebral disc 03/10/2022   Generalized psoriasis 03/10/2022   Other long term (current) drug therapy 03/10/2022   Overweight 03/10/2022   Pain in limb 03/10/2022   Primary osteoarthritis 03/10/2022   Synovial cyst 03/10/2022   S/P panniculectomy 10/26/2018   Panniculitis 09/04/2018   Hypothyroidism 12/01/2010   Type 2 diabetes mellitus with diabetic polyneuropathy, with long-term current use of insulin (HCC) 12/01/2010   DIAB W/NEURO MANIFESTS TYPE II/UNS NOT UNCNTRL 12/01/2010   OTHER AND UNSPECIFIED HYPERLIPIDEMIA 12/01/2010   OBESITY, UNSPECIFIED 12/01/2010   ESSENTIAL HYPERTENSION, BENIGN 12/01/2010   Rheumatoid arthritis (HCC) 12/01/2010    REFERRING PROVIDER: Kendell Bane Dawley DO.  REFERRING DIAG: Bilateral Sacroiliitis   Rationale for Evaluation and Treatment: Rehabilitation  THERAPY DIAG:  Other low back pain  Radiculopathy, lumbar region  Muscle weakness (generalized)  ONSET DATE: Chronic.  SUBJECTIVE:  SUBJECTIVE STATEMENT:  Back to MD 08-07-23. Doing better today with less pain. The SIJ's feel in neutral today  PERTINENT HISTORY:  See above  Lumbar L4-L5 fusion, laminectomy Removal of cyst  PAIN:  Are you having pain? Yes: NPRS scale: 7/10 Pain location: Bilateral SIJ's. Pain description: Sharp. Aggravating factors: Standing, walking.  Sitting without good lumbar support. Relieving factors: Nothing.  PRECAUTIONS: Fall.  Recommend cane usage.  WEIGHT BEARING RESTRICTIONS: No  FALLS:  Has patient fallen in last 6 months? Yes. Number of falls 3.  Occurred when pain hit (back popping out).  LIVING ENVIRONMENT: Lives  with: lives with their family Lives in: House/apartment Has following equipment at home: None  OCCUPATION: Currently out of work.  PLOF:  Prior to onset of pain she was independent.  PATIENT GOALS: Not have this pain.  Not have back give out.  Get quality of life back.   OBJECTIVE:     Date 08/22/23                                    EXERCISE LOG    Bil SIJ   Exercise Repetitions and Resistance Comments  AB bracing    Sit to stand    Drawin    Hooklying bent knee raise    Pelvic floor Draw-in Sitting  3x10 hold 5 secs   ROWs red tband 3x10   Ext red tband 3x10   Press red tband 3x10    Blank cell = exercise not performed today    [place] estim combo x 12 mins to Bil SIJ's while seated  Manual STW to Bil. SIJs   seated  x 12 mins  SCREENING FOR RED FLAGS: No.   SENSATION: C/o left foot numbness.  POSTURE: rounded shoulders, forward head, and decreased lumbar lordosis  PALPATION: Very tender to palpation over bilateral SIJ's.  Also c/o tenderness over lumbar incisional site.  LUMBAR ROM:   Active lumbar flexion limited by 50% and extension limited to 10 degrees.  LOWER EXTREMITY ROM:     WFL.  LOWER EXTREMITY MMT:    Normal bilateral knee and ankle strength.  LUMBAR SPECIAL TESTS:  Equal leg lengths. Deferred SIJ testing due to high pain-level in these areas (SIJ's).   GAIT:/TRANSFERS: Very antalgic in nature.  Transitory movements are also performed with patient in obvious pain.     PATIENT EDUCATION:  Education details: Discussed plan for spinal stabilization progression, BM and spinal protection techniques. Person educated: Patient Education method: Explanation Education comprehension: verbalized understanding  HOME EXERCISE PROGRAM:   ASSESSMENT:  CLINICAL IMPRESSION: Pt arrived today doing better with less pain Bil. SIJ pain. Rx focused on standing core strengthening and stability  exs again with RED tband with rows and shldr extensions as well as  chest press.  [place] combo  and STW performed to Bil SIJ's to decrease while seated.        OBJECTIVE IMPAIRMENTS: Abnormal gait, decreased activity tolerance, decreased mobility, difficulty walking, decreased ROM, increased muscle spasms, postural dysfunction, and pain.   ACTIVITY LIMITATIONS: carrying, lifting, bending, sitting, standing, squatting, sleeping, stairs, transfers, bed mobility, locomotion level, and caring for others  PARTICIPATION LIMITATIONS: meal prep, cleaning, laundry, shopping, community activity, and yard work  PERSONAL FACTORS: Time since onset of injury/illness/exacerbation and 1 comorbidity: previous back surgery  are also affecting patient's functional outcome.   REHAB POTENTIAL: Good+.  CLINICAL DECISION MAKING: Evolving/moderate complexity  EVALUATION  COMPLEXITY: Moderate   GOALS:  LONG TERM GOALS: Target date: 09/04/23.  Ind with a HEP. Baseline:  Goal status: Partially met  2.  Patient perform transitory movements (ie:  sit to stand, supine to sit) with pain not > 3-4/10.  Goal status: On going  3.  Walk a community distance with pain not > 3-4/10.  Goal status: On going  4.  Perform ADL's with pain not > 3-4/10.  Goal status: On going  PLAN:  PT FREQUENCY: 2x/week  PT DURATION: 6 weeks  PLANNED INTERVENTIONS: Therapeutic exercises, Therapeutic activity, Neuromuscular re-education, Balance training, Gait training, Patient/Family education, Self Care, Dry Needling, Electrical stimulation, Cryotherapy, Moist heat, Ultrasound, and Manual therapy.  PLAN FOR NEXT SESSION: Core exercise progression, spinal protection techniques and body mechanics training.   Hydee Fleece,CHRIS, PTA 08/22/2023, 4:35 PM

## 2023-08-25 ENCOUNTER — Ambulatory Visit: Payer: Commercial Managed Care - PPO | Admitting: Registered Nurse

## 2023-08-28 ENCOUNTER — Other Ambulatory Visit (HOSPITAL_COMMUNITY): Payer: Self-pay

## 2023-08-29 ENCOUNTER — Ambulatory Visit: Payer: 59 | Admitting: *Deleted

## 2023-08-29 ENCOUNTER — Encounter: Payer: Self-pay | Admitting: *Deleted

## 2023-08-29 DIAGNOSIS — M6281 Muscle weakness (generalized): Secondary | ICD-10-CM | POA: Diagnosis not present

## 2023-08-29 DIAGNOSIS — M5416 Radiculopathy, lumbar region: Secondary | ICD-10-CM | POA: Diagnosis not present

## 2023-08-29 DIAGNOSIS — M5459 Other low back pain: Secondary | ICD-10-CM

## 2023-08-29 NOTE — Therapy (Signed)
OUTPATIENT PHYSICAL THERAPY THORACOLUMBAR TREATMENT   Patient Name: Amy Adams MRN: [id number] DOB:Aug 17, 1966, 57 y.o., female Today's Date: 08/29/2023  END OF SESSION:  PT End of Session - 08/29/23 1352     Visit Number 7    Number of Visits 12    Date for PT Re-Evaluation 09/04/23    Authorization Type [facility] [employer]    PT Start Time 1348             Past Medical History:  Diagnosis Date   Anxiety    Arthritis    Chronic foot pain, left    GERD (gastroesophageal reflux disease)    History of anal fissures    Hyperlipidemia    Hypertension    Hypothyroidism    Insulin dependent type 2 diabetes mellitus (HCC)    endrocrinologist-- dr Talmage Nap   Migraines    Peripheral neuropathy    RA (rheumatoid arthritis) [facility])    rheumatologist-  dr Dierdre Forth   SUI (stress urinary incontinence, female)    Wears glasses    Past Surgical History:  Procedure Laterality Date   ABDOMINAL HYSTERECTOMY  1990   with Right Salpingo--ophorectomy   ANAL SPHINCTEROTOMY  01-05-2006    dr Zachery Dakins @[facility]    BUNIONECTOMY  09-25-2009   dr Al Corpus @[facility]    right great toe   CARPAL TUNNEL RELEASE Bilateral 1996;  1997   CESAREAN SECTION  1988   FOOT SURGERY  12/ 2014   dr Al Corpus   left 2nd hammertoe repair,  left 2nd metatarsal matrixectomy, left heel endoscopic plantar fasiotomy   IR CATHETER TUBE CHANGE  01/01/2019   IR RADIOLOGIST EVAL & MGMT  11/28/2018   IR RADIOLOGIST EVAL & MGMT  11/29/2018   IR RADIOLOGIST EVAL & MGMT  12/06/2018   IR RADIOLOGIST EVAL & MGMT  12/25/2018   IR RADIOLOGIST EVAL & MGMT  01/08/2019   IR RADIOLOGIST EVAL & MGMT  01/29/2019   PANNICULECTOMY N/A 10/26/2018   Procedure: PANNICULECTOMY;  Surgeon: Luretha Murphy, MD;  Location: [facility];  Service: General;  Laterality: N/A;   TENDON REPAIR  09/2015   left posterior tibial and peroneal tendon repairs   TOE AMPUTATION  01/2015   right 2nd toe   TONSILLECTOMY  age 1   TRANSOBTURATOR SLING  01-08-2004   dr Annabell Howells  @[facility]    Patient Active Problem List   Diagnosis Date Noted   Degeneration of lumbar intervertebral disc 03/10/2022   Generalized psoriasis 03/10/2022   Other long term (current) drug therapy 03/10/2022   Overweight 03/10/2022   Pain in limb 03/10/2022   Primary osteoarthritis 03/10/2022   Synovial cyst 03/10/2022   S/P panniculectomy 10/26/2018   Panniculitis 09/04/2018   Hypothyroidism 12/01/2010   Type 2 diabetes mellitus with diabetic polyneuropathy, with long-term current use of insulin (HCC) 12/01/2010   DIAB W/NEURO MANIFESTS TYPE II/UNS NOT UNCNTRL 12/01/2010   OTHER AND UNSPECIFIED HYPERLIPIDEMIA 12/01/2010   OBESITY, UNSPECIFIED 12/01/2010   ESSENTIAL HYPERTENSION, BENIGN 12/01/2010   Rheumatoid arthritis (HCC) 12/01/2010    REFERRING PROVIDER: Kendell Bane Dawley DO.  REFERRING DIAG: Bilateral Sacroiliitis   Rationale for Evaluation and Treatment: Rehabilitation  THERAPY DIAG:  Other low back pain  Radiculopathy, lumbar region  Muscle weakness (generalized)  ONSET DATE: Chronic.  SUBJECTIVE:  SUBJECTIVE STATEMENT:   Doing better today with less pain. The SIJ's feel in neutral today. Went out on me  yesterday and was very sharp/painful  PERTINENT HISTORY:  See above  Lumbar L4-L5 fusion, laminectomy Removal of cyst  PAIN:  Are you having pain? Yes: NPRS scale: 7/10 Pain location: Bilateral SIJ's. Pain description: Sharp. Aggravating factors: Standing, walking.  Sitting without good lumbar support. Relieving factors: Nothing.  PRECAUTIONS: Fall.  Recommend cane usage.  WEIGHT BEARING RESTRICTIONS: No  FALLS:  Has patient fallen in last 6 months? Yes. Number of falls 3.  Occurred when pain hit (back popping out).  LIVING ENVIRONMENT: Lives with: lives with their  family Lives in: House/apartment Has following equipment at home: None  OCCUPATION: Currently out of work.  PLOF:  Prior to onset of pain she was independent.  PATIENT GOALS: Not have this pain.  Not have back give out.  Get quality of life back.   OBJECTIVE:     Date 08/22/23                                    EXERCISE LOG    Bil SIJ   Exercise Repetitions and Resistance Comments  AB bracing    Sit to stand    Drawin    Hooklying bent knee raise    Pelvic floor Draw-in    ROWs red tband 2x10   Ext red tband 2x10   Press red tband 2x10    Blank cell = exercise not performed today    [place] estim combo x 10 mins to Bil SIJ's while seated  Manual STW to Bil. SIJs   seated  x IFC x 15 mins  80-150hz  to Bil. To SIJ's in sitting SCREENING FOR RED FLAGS: No.   SENSATION: C/o left foot numbness.  POSTURE: rounded shoulders, forward head, and decreased lumbar lordosis  PALPATION: Very tender to palpation over bilateral SIJ's.  Also c/o tenderness over lumbar incisional site.  LUMBAR ROM:   Active lumbar flexion limited by 50% and extension limited to 10 degrees.  LOWER EXTREMITY ROM:     WFL.  LOWER EXTREMITY MMT:    Normal bilateral knee and ankle strength.  LUMBAR SPECIAL TESTS:  Equal leg lengths. Deferred SIJ testing due to high pain-level in these areas (SIJ's).   GAIT:/TRANSFERS: Very antalgic in nature.  Transitory movements are also performed with patient in obvious pain.     PATIENT EDUCATION:  Education details: Discussed plan for spinal stabilization progression, BM and spinal protection techniques. Person educated: Patient Education method: Explanation Education comprehension: verbalized understanding  HOME EXERCISE PROGRAM:   ASSESSMENT:  CLINICAL IMPRESSION: Pt arrived today doing okay with pain, but had a sharp pain yesterday. Very sore today RT side SIJ. Rx focused on therex stabilization as well as [place] combo, STW, IFC / HMP for pain  control.    OBJECTIVE IMPAIRMENTS: Abnormal gait, decreased activity tolerance, decreased mobility, difficulty walking, decreased ROM, increased muscle spasms, postural dysfunction, and pain.   ACTIVITY LIMITATIONS: carrying, lifting, bending, sitting, standing, squatting, sleeping, stairs, transfers, bed mobility, locomotion level, and caring for others  PARTICIPATION LIMITATIONS: meal prep, cleaning, laundry, shopping, community activity, and yard work  PERSONAL FACTORS: Time since onset of injury/illness/exacerbation and 1 comorbidity: previous back surgery  are also affecting patient's functional outcome.   REHAB POTENTIAL: Good+.  CLINICAL DECISION MAKING: Evolving/moderate complexity  EVALUATION COMPLEXITY: Moderate   GOALS:  LONG TERM GOALS: Target date: 09/04/23.  Ind with a HEP. Baseline:  Goal status: Partially met  2.  Patient perform transitory movements (ie:  sit to stand, supine to sit) with pain not > 3-4/10.  Goal status: On going  3.  Walk a community distance with pain not > 3-4/10.  Goal status: On going  4.  Perform ADL's with pain not > 3-4/10.  Goal status: On going  PLAN:  PT FREQUENCY: 2x/week  PT DURATION: 6 weeks  PLANNED INTERVENTIONS: Therapeutic exercises, Therapeutic activity, Neuromuscular re-education, Balance training, Gait training, Patient/Family education, Self Care, Dry Needling, Electrical stimulation, Cryotherapy, Moist heat, Ultrasound, and Manual therapy.  PLAN FOR NEXT SESSION: Core exercise progression, spinal protection techniques and body mechanics training.   Ngozi Alvidrez,CHRIS, PTA 08/29/2023, 2:47 PM

## 2023-09-03 ENCOUNTER — Other Ambulatory Visit (HOSPITAL_COMMUNITY): Payer: Self-pay

## 2023-09-04 ENCOUNTER — Other Ambulatory Visit (HOSPITAL_COMMUNITY): Payer: Self-pay

## 2023-09-04 MED ORDER — HYDROXYCHLOROQUINE SULFATE 200 MG PO TABS
400.0000 mg | ORAL_TABLET | Freq: Every day | ORAL | 0 refills | Status: AC
  Filled 2023-09-04: qty 60, 30d supply, fill #0
  Filled 2023-10-06: qty 60, 30d supply, fill #1
  Filled 2023-11-04: qty 60, 30d supply, fill #2

## 2023-09-05 ENCOUNTER — Ambulatory Visit: Payer: 59 | Admitting: *Deleted

## 2023-09-05 DIAGNOSIS — M5459 Other low back pain: Secondary | ICD-10-CM | POA: Diagnosis not present

## 2023-09-05 DIAGNOSIS — M6281 Muscle weakness (generalized): Secondary | ICD-10-CM | POA: Diagnosis not present

## 2023-09-05 DIAGNOSIS — M5416 Radiculopathy, lumbar region: Secondary | ICD-10-CM | POA: Diagnosis not present

## 2023-09-05 NOTE — Therapy (Signed)
OUTPATIENT PHYSICAL THERAPY THORACOLUMBAR TREATMENT   Patient Name: Amy Adams MRN: [id number] DOB:Dec 23, 1965, 57 y.o., female Today's Date: 09/05/2023  END OF SESSION:  PT End of Session - 09/05/23 1338     Visit Number 8    Number of Visits 12    Date for PT Re-Evaluation 09/04/23    Authorization Type [facility] Employee [employer]    PT Start Time 1300    PT Stop Time 1350    PT Time Calculation (min) 50 min             Past Medical History:  Diagnosis Date   Anxiety    Arthritis    Chronic foot pain, left    GERD (gastroesophageal reflux disease)    History of anal fissures    Hyperlipidemia    Hypertension    Hypothyroidism    Insulin dependent type 2 diabetes mellitus (HCC)    endrocrinologist-- dr Talmage Nap   Migraines    Peripheral neuropathy    RA (rheumatoid arthritis) [facility])    rheumatologist-  dr Dierdre Forth   SUI (stress urinary incontinence, female)    Wears glasses    Past Surgical History:  Procedure Laterality Date   ABDOMINAL HYSTERECTOMY  1990   with Right Salpingo--ophorectomy   ANAL SPHINCTEROTOMY  01-05-2006    dr Zachery Dakins @[facility]    BUNIONECTOMY  09-25-2009   dr Al Corpus @[facility]    right great toe   CARPAL TUNNEL RELEASE Bilateral 1996;  1997   CESAREAN SECTION  1988   FOOT SURGERY  12/ 2014   dr Al Corpus   left 2nd hammertoe repair,  left 2nd metatarsal matrixectomy, left heel endoscopic plantar fasiotomy   IR CATHETER TUBE CHANGE  01/01/2019   IR RADIOLOGIST EVAL & MGMT  11/28/2018   IR RADIOLOGIST EVAL & MGMT  11/29/2018   IR RADIOLOGIST EVAL & MGMT  12/06/2018   IR RADIOLOGIST EVAL & MGMT  12/25/2018   IR RADIOLOGIST EVAL & MGMT  01/08/2019   IR RADIOLOGIST EVAL & MGMT  01/29/2019   PANNICULECTOMY N/A 10/26/2018   Procedure: PANNICULECTOMY;  Surgeon: Luretha Murphy, MD;  Location: [facility];  Service: General;  Laterality: N/A;   TENDON REPAIR  09/2015   left posterior tibial and peroneal tendon repairs   TOE AMPUTATION  01/2015   right 2nd toe    TONSILLECTOMY  age 60   TRANSOBTURATOR SLING  01-08-2004   dr Annabell Howells @[facility]    Patient Active Problem List   Diagnosis Date Noted   Degeneration of lumbar intervertebral disc 03/10/2022   Generalized psoriasis 03/10/2022   Other long term (current) drug therapy 03/10/2022   Overweight 03/10/2022   Pain in limb 03/10/2022   Primary osteoarthritis 03/10/2022   Synovial cyst 03/10/2022   S/P panniculectomy 10/26/2018   Panniculitis 09/04/2018   Hypothyroidism 12/01/2010   Type 2 diabetes mellitus with diabetic polyneuropathy, with long-term current use of insulin (HCC) 12/01/2010   DIAB W/NEURO MANIFESTS TYPE II/UNS NOT UNCNTRL 12/01/2010   OTHER AND UNSPECIFIED HYPERLIPIDEMIA 12/01/2010   OBESITY, UNSPECIFIED 12/01/2010   ESSENTIAL HYPERTENSION, BENIGN 12/01/2010   Rheumatoid arthritis (HCC) 12/01/2010    REFERRING PROVIDER: Kendell Bane Dawley DO.  REFERRING DIAG: Bilateral Sacroiliitis   Rationale for Evaluation and Treatment: Rehabilitation  THERAPY DIAG:  Other low back pain  Radiculopathy, lumbar region  Muscle weakness (generalized)  ONSET DATE: Chronic.  SUBJECTIVE:  SUBJECTIVE STATEMENT:    Not doing good today due to increased pain after sneezing 3 days ago and pain is 7/10. Rx's have helped decrease pain, but it keeps coming back. I am having a hard time sleeping due to the pain. Night time is the worst.  PERTINENT HISTORY:  See above  Lumbar L4-L5 fusion, laminectomy Removal of cyst  PAIN:  Are you having pain? Yes: NPRS scale: 7/10 Pain location: Bilateral SIJ's. Pain description: Sharp. Aggravating factors: Standing, walking.  Sitting without good lumbar support. Relieving factors: Nothing.  PRECAUTIONS: Fall.  Recommend cane usage.  WEIGHT BEARING RESTRICTIONS: No  FALLS:   Has patient fallen in last 6 months? Yes. Number of falls 3.  Occurred when pain hit (back popping out).  LIVING ENVIRONMENT: Lives with: lives with their family Lives in: House/apartment Has following equipment at home: None  OCCUPATION: Currently out of work.  PLOF:  Prior to onset of pain she was independent.  PATIENT GOALS: Not have this pain.  Not have back give out.  Get quality of life back.   OBJECTIVE:    09/05/23  [place] estim combo x 12 mins  at 1.5 w/cm2  to Bil SIJ's and LB paras while seated Manual STW to Bil. SIJs   seated  IFC x 15 mins  80-150hz  to Bil. To SIJ's in sitting Seated Adductor squeeze x 10 hold 5 secs      Date 08/22/23                                    EXERCISE LOG    Bil SIJ   Exercise Repetitions and Resistance Comments  AB bracing    Sit to stand    Drawin    Hooklying bent knee raise    Pelvic floor Draw-in    ROWs red tband 2x10   Ext red tband 2x10   Press red tband 2x10    Blank cell = exercise not performed today    [place] estim combo x 10 mins to Bil SIJ's while seated  Manual STW to Bil. SIJs   seated  x IFC x 15 mins  80-150hz  to Bil. To SIJ's in sitting SCREENING FOR RED FLAGS: No.   SENSATION: C/o left foot numbness.  POSTURE: rounded shoulders, forward head, and decreased lumbar lordosis  PALPATION: Very tender to palpation over bilateral SIJ's.  Also c/o tenderness over lumbar incisional site.  LUMBAR ROM:   Active lumbar flexion limited by 50% and extension limited to 10 degrees.  LOWER EXTREMITY ROM:     WFL.  LOWER EXTREMITY MMT:    Normal bilateral knee and ankle strength.  LUMBAR SPECIAL TESTS:  Equal leg lengths. Deferred SIJ testing due to high pain-level in these areas (SIJ's).   GAIT:/TRANSFERS: Very antalgic in nature.  Transitory movements are also performed with patient in obvious pain.     PATIENT EDUCATION:  Education details: Discussed plan for spinal stabilization progression, BM  and spinal protection techniques. Person educated: Patient Education method: Explanation Education comprehension: verbalized understanding  HOME EXERCISE PROGRAM:   ASSESSMENT:  CLINICAL IMPRESSION: Pt arrived today not doing well due to increased LBP/SIJ.pain. She reports sneezing 3 days ago and has had increased pain since. Rx focused on pain reduction with [place] combo, STW and IFC/ HMP to Bil. SIJ's and LB paras.   OBJECTIVE IMPAIRMENTS: Abnormal gait, decreased activity tolerance, decreased mobility, difficulty walking, decreased ROM, increased  muscle spasms, postural dysfunction, and pain.   ACTIVITY LIMITATIONS: carrying, lifting, bending, sitting, standing, squatting, sleeping, stairs, transfers, bed mobility, locomotion level, and caring for others  PARTICIPATION LIMITATIONS: meal prep, cleaning, laundry, shopping, community activity, and yard work  PERSONAL FACTORS: Time since onset of injury/illness/exacerbation and 1 comorbidity: previous back surgery  are also affecting patient's functional outcome.   REHAB POTENTIAL: Good+.  CLINICAL DECISION MAKING: Evolving/moderate complexity  EVALUATION COMPLEXITY: Moderate   GOALS:  LONG TERM GOALS: Target date: 09/04/23.  Ind with a HEP. Baseline:  Goal status: Partially met  2.  Patient perform transitory movements (ie:  sit to stand, supine to sit) with pain not > 3-4/10.  Goal status: On going  3.  Walk a community distance with pain not > 3-4/10.  Goal status: On going  4.  Perform ADL's with pain not > 3-4/10.  Goal status: On going  PLAN:  PT FREQUENCY: 2x/week  PT DURATION: 6 weeks  PLANNED INTERVENTIONS: Therapeutic exercises, Therapeutic activity, Neuromuscular re-education, Balance training, Gait training, Patient/Family education, Self Care, Dry Needling, Electrical stimulation, Cryotherapy, Moist heat, Ultrasound, and Manual therapy.  PLAN FOR NEXT SESSION: Core exercise progression, spinal protection  techniques and body mechanics training.   Amy Adams,Amy Adams, PTA 09/05/2023, 3:41 PM

## 2023-09-06 ENCOUNTER — Other Ambulatory Visit (HOSPITAL_COMMUNITY): Payer: Self-pay

## 2023-09-06 DIAGNOSIS — R233 Spontaneous ecchymoses: Secondary | ICD-10-CM | POA: Diagnosis not present

## 2023-09-06 NOTE — Addendum Note (Signed)
Addended by: Alane Hanssen, [place] W on: 09/06/2023 11:28 AM   Modules accepted: Orders

## 2023-09-12 ENCOUNTER — Ambulatory Visit: Payer: 59 | Admitting: *Deleted

## 2023-09-19 ENCOUNTER — Other Ambulatory Visit: Payer: Self-pay | Admitting: Registered Nurse

## 2023-09-19 ENCOUNTER — Encounter: Payer: 59 | Admitting: *Deleted

## 2023-09-19 ENCOUNTER — Other Ambulatory Visit: Payer: Self-pay

## 2023-09-19 ENCOUNTER — Other Ambulatory Visit (HOSPITAL_COMMUNITY): Payer: Self-pay

## 2023-09-19 MED ORDER — TRAMADOL HCL 50 MG PO TABS
50.0000 mg | ORAL_TABLET | Freq: Three times a day (TID) | ORAL | 0 refills | Status: DC | PRN
  Filled 2023-09-19: qty 270, 90d supply, fill #0

## 2023-09-19 NOTE — Telephone Encounter (Signed)
PMP was Reviewed.  UDS was Reviewed.  Tramadol e-scribed to pharmacy Ms. Saari is aware of the above, via My-Chart

## 2023-09-20 ENCOUNTER — Other Ambulatory Visit: Payer: Self-pay

## 2023-09-22 ENCOUNTER — Other Ambulatory Visit (HOSPITAL_COMMUNITY): Payer: Self-pay

## 2023-09-25 ENCOUNTER — Other Ambulatory Visit: Payer: Self-pay

## 2023-09-25 ENCOUNTER — Other Ambulatory Visit (HOSPITAL_COMMUNITY): Payer: Self-pay

## 2023-09-25 NOTE — Telephone Encounter (Signed)
Noelene Sadowsky (Key: [id number])  PA Submitted for tramadol 09/25/23

## 2023-09-26 ENCOUNTER — Other Ambulatory Visit (HOSPITAL_COMMUNITY): Payer: Self-pay

## 2023-10-06 ENCOUNTER — Other Ambulatory Visit (HOSPITAL_COMMUNITY): Payer: Self-pay

## 2023-10-06 ENCOUNTER — Other Ambulatory Visit: Payer: Self-pay

## 2023-10-09 ENCOUNTER — Other Ambulatory Visit: Payer: Self-pay

## 2023-10-09 ENCOUNTER — Other Ambulatory Visit (HOSPITAL_COMMUNITY): Payer: Self-pay

## 2023-10-16 ENCOUNTER — Other Ambulatory Visit: Payer: Self-pay

## 2023-10-16 ENCOUNTER — Encounter (HOSPITAL_COMMUNITY): Payer: Self-pay

## 2023-10-16 ENCOUNTER — Other Ambulatory Visit (HOSPITAL_COMMUNITY): Payer: Self-pay

## 2023-10-16 DIAGNOSIS — M461 Sacroiliitis, not elsewhere classified: Secondary | ICD-10-CM | POA: Diagnosis not present

## 2023-10-16 DIAGNOSIS — Z6830 Body mass index (BMI) 30.0-30.9, adult: Secondary | ICD-10-CM | POA: Diagnosis not present

## 2023-10-19 ENCOUNTER — Other Ambulatory Visit (HOSPITAL_COMMUNITY): Payer: Self-pay

## 2023-10-19 ENCOUNTER — Other Ambulatory Visit: Payer: Self-pay

## 2023-10-19 DIAGNOSIS — Z6831 Body mass index (BMI) 31.0-31.9, adult: Secondary | ICD-10-CM | POA: Diagnosis not present

## 2023-10-19 DIAGNOSIS — Z01419 Encounter for gynecological examination (general) (routine) without abnormal findings: Secondary | ICD-10-CM | POA: Diagnosis not present

## 2023-10-19 DIAGNOSIS — Z1231 Encounter for screening mammogram for malignant neoplasm of breast: Secondary | ICD-10-CM | POA: Diagnosis not present

## 2023-10-23 ENCOUNTER — Other Ambulatory Visit: Payer: Self-pay

## 2023-10-26 DIAGNOSIS — E669 Obesity, unspecified: Secondary | ICD-10-CM | POA: Diagnosis not present

## 2023-10-26 DIAGNOSIS — E039 Hypothyroidism, unspecified: Secondary | ICD-10-CM | POA: Diagnosis not present

## 2023-10-26 DIAGNOSIS — E1122 Type 2 diabetes mellitus with diabetic chronic kidney disease: Secondary | ICD-10-CM | POA: Diagnosis not present

## 2023-10-26 DIAGNOSIS — K219 Gastro-esophageal reflux disease without esophagitis: Secondary | ICD-10-CM | POA: Diagnosis not present

## 2023-10-26 DIAGNOSIS — Z89422 Acquired absence of other left toe(s): Secondary | ICD-10-CM | POA: Diagnosis not present

## 2023-10-26 DIAGNOSIS — E1142 Type 2 diabetes mellitus with diabetic polyneuropathy: Secondary | ICD-10-CM | POA: Diagnosis not present

## 2023-10-26 DIAGNOSIS — F329 Major depressive disorder, single episode, unspecified: Secondary | ICD-10-CM | POA: Diagnosis not present

## 2023-10-26 DIAGNOSIS — F411 Generalized anxiety disorder: Secondary | ICD-10-CM | POA: Diagnosis not present

## 2023-10-26 DIAGNOSIS — Z6831 Body mass index (BMI) 31.0-31.9, adult: Secondary | ICD-10-CM | POA: Diagnosis not present

## 2023-10-26 DIAGNOSIS — M0550 Rheumatoid polyneuropathy with rheumatoid arthritis of unspecified site: Secondary | ICD-10-CM | POA: Diagnosis not present

## 2023-10-26 DIAGNOSIS — M1990 Unspecified osteoarthritis, unspecified site: Secondary | ICD-10-CM | POA: Diagnosis not present

## 2023-10-26 DIAGNOSIS — E785 Hyperlipidemia, unspecified: Secondary | ICD-10-CM | POA: Diagnosis not present

## 2023-11-01 DIAGNOSIS — M0589 Other rheumatoid arthritis with rheumatoid factor of multiple sites: Secondary | ICD-10-CM | POA: Diagnosis not present

## 2023-11-02 DIAGNOSIS — M5417 Radiculopathy, lumbosacral region: Secondary | ICD-10-CM | POA: Diagnosis not present

## 2023-11-02 DIAGNOSIS — Z6831 Body mass index (BMI) 31.0-31.9, adult: Secondary | ICD-10-CM | POA: Diagnosis not present

## 2023-11-03 ENCOUNTER — Other Ambulatory Visit: Payer: Self-pay

## 2023-11-03 MED ORDER — TRULICITY 1.5 MG/0.5ML ~~LOC~~ SOAJ
1.5000 mg | SUBCUTANEOUS | 5 refills | Status: DC
  Filled 2023-11-03 – 2023-11-06 (×2): qty 2, 28d supply, fill #0
  Filled 2023-11-28: qty 2, 28d supply, fill #1

## 2023-11-06 ENCOUNTER — Other Ambulatory Visit (HOSPITAL_COMMUNITY): Payer: Self-pay

## 2023-11-06 ENCOUNTER — Other Ambulatory Visit: Payer: Self-pay

## 2023-11-09 ENCOUNTER — Other Ambulatory Visit: Payer: Self-pay

## 2023-11-21 ENCOUNTER — Other Ambulatory Visit (HOSPITAL_COMMUNITY): Payer: Self-pay

## 2023-11-22 ENCOUNTER — Other Ambulatory Visit: Payer: Self-pay

## 2023-11-22 DIAGNOSIS — R202 Paresthesia of skin: Secondary | ICD-10-CM

## 2023-11-28 ENCOUNTER — Other Ambulatory Visit (HOSPITAL_COMMUNITY): Payer: Self-pay

## 2023-11-28 ENCOUNTER — Encounter (HOSPITAL_COMMUNITY): Payer: Self-pay

## 2023-11-28 ENCOUNTER — Other Ambulatory Visit: Payer: Self-pay | Admitting: Registered Nurse

## 2023-11-28 ENCOUNTER — Other Ambulatory Visit: Payer: Self-pay

## 2023-11-28 ENCOUNTER — Telehealth: Payer: Self-pay | Admitting: Registered Nurse

## 2023-11-28 NOTE — Telephone Encounter (Signed)
 PMP was Reviewed.  09/20/2023: Tramadol was filled for three months. She has a scheduled appointment with this provider on 12/07/2023.

## 2023-11-29 ENCOUNTER — Other Ambulatory Visit (HOSPITAL_COMMUNITY): Payer: Self-pay

## 2023-11-29 DIAGNOSIS — M461 Sacroiliitis, not elsewhere classified: Secondary | ICD-10-CM | POA: Diagnosis not present

## 2023-11-29 DIAGNOSIS — M5416 Radiculopathy, lumbar region: Secondary | ICD-10-CM | POA: Diagnosis not present

## 2023-11-29 DIAGNOSIS — Z9889 Other specified postprocedural states: Secondary | ICD-10-CM | POA: Diagnosis not present

## 2023-11-29 MED ORDER — VALACYCLOVIR HCL 1 G PO TABS
2.0000 g | ORAL_TABLET | Freq: Two times a day (BID) | ORAL | 3 refills | Status: AC | PRN
  Filled 2023-11-29: qty 12, 3d supply, fill #0

## 2023-11-30 DIAGNOSIS — M545 Low back pain, unspecified: Secondary | ICD-10-CM | POA: Diagnosis not present

## 2023-11-30 DIAGNOSIS — M2578 Osteophyte, vertebrae: Secondary | ICD-10-CM | POA: Diagnosis not present

## 2023-11-30 DIAGNOSIS — E1165 Type 2 diabetes mellitus with hyperglycemia: Secondary | ICD-10-CM | POA: Diagnosis not present

## 2023-11-30 DIAGNOSIS — M5417 Radiculopathy, lumbosacral region: Secondary | ICD-10-CM | POA: Diagnosis not present

## 2023-11-30 DIAGNOSIS — R531 Weakness: Secondary | ICD-10-CM | POA: Diagnosis not present

## 2023-11-30 DIAGNOSIS — R202 Paresthesia of skin: Secondary | ICD-10-CM | POA: Diagnosis not present

## 2023-12-04 ENCOUNTER — Other Ambulatory Visit (HOSPITAL_COMMUNITY): Payer: Self-pay

## 2023-12-06 NOTE — Progress Notes (Unsigned)
Subjective:    Patient ID: Amy Adams, female    DOB: 04-13-1966, 58 y.o.   MRN: [id number]  HPI: Amy Adams is a 58 y.o. female who returns for follow up appointment for chronic pain and medication refill. She states her pain is located in her right shoulder, lower back and bilateral feet with numbness L>R. She rates her pain 8. Her current exercise regime is walking short distances, she was  encouraged to increase her HEP as tolerated.   Ms. Channel Morphine equivalent is 30.00 MME.   UDS ordered today.      Pain Inventory Average Pain 10 Pain Right Now 8 My pain is constant, sharp, burning, dull, stabbing, tingling, and aching  In the last 24 hours, has pain interfered with the following? General activity 10 Relation with others 10 Enjoyment of life 10 What TIME of day is your pain at its worst? morning , daytime, evening, and night Sleep (in general) Poor  Pain is worse with: walking, bending, sitting, inactivity, standing, and some activites Pain improves with: medication Relief from Meds: 4  Family History  Problem Relation Age of Onset   Diabetes Mother    Migraines Mother    Arrhythmia Father    COPD Father    Cancer Father    Diabetes Sister    Social History   Socioeconomic History   Marital status: Widowed    Spouse name: Not on file   Number of children: 2   Years of education: Not on file   Highest education level: Not on file  Occupational History   Not on file  Tobacco Use   Smoking status: Never   Smokeless tobacco: Never  Vaping Use   Vaping status: Never Used  Substance and Sexual Activity   Alcohol use: No   Drug use: Never   Sexual activity: Not on file  Other Topics Concern   Not on file  Social History Narrative   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   Are you right handed or left handed? Right   Are you currently employed ? [employer]   What is your current occupation?   Do you live at home alone? yes   Caffeine 2 weekly soda    What type of home do you live in: 1 story or 2 story? one       Social Drivers of Corporate investment banker Strain: Not on file  Food Insecurity: Not on file  Transportation Needs: Not on file  Physical Activity: Not on file  Stress: Not on file  Social Connections: Not on file   Past Surgical History:  Procedure Laterality Date   ABDOMINAL HYSTERECTOMY  1990   with Right Salpingo--ophorectomy   ANAL SPHINCTEROTOMY  01-05-2006    dr Zachery Dakins @[facility]    BUNIONECTOMY  09-25-2009   dr Al Corpus @[facility]    right great toe   CARPAL TUNNEL RELEASE Bilateral 1996;  1997   CESAREAN SECTION  1988   FOOT SURGERY  12/ 2014   dr Al Corpus   left 2nd hammertoe repair,  left 2nd metatarsal matrixectomy, left heel endoscopic plantar fasiotomy   IR CATHETER TUBE CHANGE  01/01/2019   IR RADIOLOGIST EVAL & MGMT  11/28/2018   IR RADIOLOGIST EVAL & MGMT  11/29/2018   IR RADIOLOGIST EVAL & MGMT  12/06/2018   IR RADIOLOGIST EVAL & MGMT  12/25/2018   IR RADIOLOGIST EVAL & MGMT  01/08/2019   IR RADIOLOGIST EVAL & MGMT  01/29/2019   PANNICULECTOMY  N/A 10/26/2018   Procedure: PANNICULECTOMY;  Surgeon: Luretha Murphy, MD;  Location: [facility];  Service: General;  Laterality: N/A;   TENDON REPAIR  09/2015   left posterior tibial and peroneal tendon repairs   TOE AMPUTATION  01/2015   right 2nd toe   TONSILLECTOMY  age 82   TRANSOBTURATOR SLING  01-08-2004   dr Annabell Howells @[facility]    Past Surgical History:  Procedure Laterality Date   ABDOMINAL HYSTERECTOMY  1990   with Right Salpingo--ophorectomy   ANAL SPHINCTEROTOMY  01-05-2006    dr Zachery Dakins Amy Adams    BUNIONECTOMY  09-25-2009   dr Al Corpus @[facility]    right great toe   CARPAL TUNNEL RELEASE Bilateral 1996;  1997   CESAREAN SECTION  1988   FOOT SURGERY  12/ 2014   dr Al Corpus   left 2nd hammertoe repair,  left 2nd metatarsal matrixectomy, left heel endoscopic plantar fasiotomy   IR CATHETER TUBE CHANGE  01/01/2019   IR RADIOLOGIST EVAL & MGMT  11/28/2018   IR RADIOLOGIST EVAL &  MGMT  11/29/2018   IR RADIOLOGIST EVAL & MGMT  12/06/2018   IR RADIOLOGIST EVAL & MGMT  12/25/2018   IR RADIOLOGIST EVAL & MGMT  01/08/2019   IR RADIOLOGIST EVAL & MGMT  01/29/2019   PANNICULECTOMY N/A 10/26/2018   Procedure: PANNICULECTOMY;  Surgeon: Luretha Murphy, MD;  Location: [facility];  Service: General;  Laterality: N/A;   TENDON REPAIR  09/2015   left posterior tibial and peroneal tendon repairs   TOE AMPUTATION  01/2015   right 2nd toe   TONSILLECTOMY  age 72   TRANSOBTURATOR SLING  01-08-2004   dr Annabell Howells @[facility]    Past Medical History:  Diagnosis Date   Anxiety    Arthritis    Chronic foot pain, left    GERD (gastroesophageal reflux disease)    History of anal fissures    Hyperlipidemia    Hypertension    Hypothyroidism    Insulin dependent type 2 diabetes mellitus (HCC)    endrocrinologist-- dr Talmage Nap   Migraines    Peripheral neuropathy    RA (rheumatoid arthritis) [facility])    rheumatologist-  dr Dierdre Forth   SUI (stress urinary incontinence, female)    Wears glasses    There were no vitals taken for this visit.  Opioid Risk Score:   Fall Risk Score:  `1  Depression screen PHQ 2/9     06/14/2023    1:31 PM 02/21/2023    2:16 PM 03/11/2022    1:04 PM 09/02/2021    9:14 AM 08/12/2021    2:31 PM 12/23/2019    8:13 AM 04/03/2019    9:01 AM  Depression screen PHQ 2/9  Decreased Interest 3 3 0 0 0 0 1  Down, Depressed, Hopeless 1 3 0 0 1 0 0  PHQ - 2 Score 4 6 0 0 1 0 1  Altered sleeping     3    Tired, decreased energy     1    Change in appetite     3    Feeling bad or failure about yourself      0    Trouble concentrating     0    Moving slowly or fidgety/restless     0    Suicidal thoughts     0    PHQ-9 Score     8    Difficult doing work/chores     Not difficult at all      Review of  Systems  Musculoskeletal:  Positive for back pain.       Pain in both legs, feet, left shoulder  All other systems reviewed and are negative.      Objective:   Physical  Exam Vitals and nursing note reviewed.  Constitutional:      Appearance: Normal appearance.  Cardiovascular:     Rate and Rhythm: Normal rate and regular rhythm.     Pulses: Normal pulses.     Heart sounds: Normal heart sounds.  Pulmonary:     Effort: Pulmonary effort is normal.     Breath sounds: Normal breath sounds.  Musculoskeletal:     Comments: Normal Muscle Bulk and Muscle Testing Reveals:  Upper Extremities:Full  ROM and Muscle Strength 5/5 Lumbar Paraspinal Tenderness: L-3-L-5 Lower Extremities: Full ROM and Muscle Strength 5/5 Bilateral Lower Extremities Flexion Produces Pain into her Lower Back  Arises from Table with ease Narrow Based  Gait     Skin:    General: Skin is warm and dry.  Neurological:     Mental Status: She is alert and oriented to person, place, and time.  Psychiatric:        Mood and Affect: Mood normal.        Behavior: Behavior normal.         Assessment & Plan:  Right Lumbar Radiculitis/ Chronic Bilateral Low Back Pain without Sciatica: S/P   surgery on 11/11/2022: L4-5 posterior lumbar interbody fusion by Dr Yetta Barre.Continue HEP as Tolerated. . Continue current medication regimen. Continue to monitor. 12/07/2023 Painful Diabetic Neuropathy:  Continue Gabapentin.12/07/2023 3. Right Shoulder Pain: Continue HEP as Tolerated. Continue to Monitor.  4. Chronic Pain Syndrome: Refilled Tramadol 50 mg  one tablet three times a  day as needed for pain # 270. We will continue the opioid monitoring program, this consists of regular clinic visits, examinations, urine drug screen, pill counts as well as use of [place] Controlled Substance Reporting system. A 12 month History has been reviewed on the [place] Controlled Substance Reporting System on 12/07/2023   F/U in 6 months

## 2023-12-07 ENCOUNTER — Encounter: Payer: Self-pay | Admitting: Registered Nurse

## 2023-12-07 ENCOUNTER — Encounter: Payer: 59 | Attending: Registered Nurse | Admitting: Registered Nurse

## 2023-12-07 VITALS — BP 145/95 | HR 87 | Ht 69.0 in | Wt 210.0 lb

## 2023-12-07 DIAGNOSIS — E049 Nontoxic goiter, unspecified: Secondary | ICD-10-CM | POA: Diagnosis not present

## 2023-12-07 DIAGNOSIS — Z5181 Encounter for therapeutic drug level monitoring: Secondary | ICD-10-CM | POA: Insufficient documentation

## 2023-12-07 DIAGNOSIS — M545 Low back pain, unspecified: Secondary | ICD-10-CM | POA: Insufficient documentation

## 2023-12-07 DIAGNOSIS — G8929 Other chronic pain: Secondary | ICD-10-CM | POA: Diagnosis not present

## 2023-12-07 DIAGNOSIS — G894 Chronic pain syndrome: Secondary | ICD-10-CM | POA: Diagnosis not present

## 2023-12-07 DIAGNOSIS — I10 Essential (primary) hypertension: Secondary | ICD-10-CM | POA: Diagnosis not present

## 2023-12-07 DIAGNOSIS — G5793 Unspecified mononeuropathy of bilateral lower limbs: Secondary | ICD-10-CM | POA: Insufficient documentation

## 2023-12-07 DIAGNOSIS — M25511 Pain in right shoulder: Secondary | ICD-10-CM | POA: Insufficient documentation

## 2023-12-07 DIAGNOSIS — M069 Rheumatoid arthritis, unspecified: Secondary | ICD-10-CM | POA: Diagnosis not present

## 2023-12-07 DIAGNOSIS — E039 Hypothyroidism, unspecified: Secondary | ICD-10-CM | POA: Diagnosis not present

## 2023-12-07 DIAGNOSIS — M79672 Pain in left foot: Secondary | ICD-10-CM | POA: Insufficient documentation

## 2023-12-07 DIAGNOSIS — E1165 Type 2 diabetes mellitus with hyperglycemia: Secondary | ICD-10-CM | POA: Diagnosis not present

## 2023-12-07 DIAGNOSIS — Z79899 Other long term (current) drug therapy: Secondary | ICD-10-CM | POA: Diagnosis not present

## 2023-12-07 DIAGNOSIS — E7800 Pure hypercholesterolemia, unspecified: Secondary | ICD-10-CM | POA: Diagnosis not present

## 2023-12-07 DIAGNOSIS — G609 Hereditary and idiopathic neuropathy, unspecified: Secondary | ICD-10-CM | POA: Diagnosis not present

## 2023-12-07 MED ORDER — TRAMADOL HCL 50 MG PO TABS: 50.0000 mg | ORAL_TABLET | Freq: Three times a day (TID) | ORAL | 1 refills | Status: DC | PRN

## 2023-12-08 ENCOUNTER — Telehealth: Payer: Self-pay

## 2023-12-08 NOTE — Telephone Encounter (Signed)
PA for Tramadol submitted  Emmilia Forman (Key: [id number])

## 2023-12-11 LAB — TOXASSURE SELECT,+ANTIDEPR,UR

## 2023-12-14 ENCOUNTER — Ambulatory Visit: Payer: 59 | Admitting: Neurology

## 2023-12-14 DIAGNOSIS — R202 Paresthesia of skin: Secondary | ICD-10-CM

## 2023-12-14 DIAGNOSIS — M5416 Radiculopathy, lumbar region: Secondary | ICD-10-CM

## 2023-12-14 NOTE — Procedures (Signed)
[facility]  [address] [place], [address]  [place], [place] [address] Tel: [phone] Fax: [phone] Test Date:  12/14/2023  Patient: Amy Adams DOB: 1966/09/01 Physician: Nita Sickle, DO  Sex: Female Height: 5\' 9"  Ref Phys: Verlin Dike, NP  ID#: [id number]   Technician:    History: This is a 58 year old female with history of L4-5 fusion referred for evaluation of bilateral leg pain.  NCV & EMG Findings: Electrodiagnostic testing of the right lower extremity and additional studies of the left shows: Bilateral sural and superficial peroneal sensory responses are within normal limits. Bilateral peroneal and tibial motor responses are within normal limits. Bilateral tibial H reflex studies are within normal limits. Chronic motor axonal loss changes are seen affecting the left L5 myotome, without accompanying active denervation.   Impression: Chronic L5 radiculopathy, mild; these findings are stable as compared to prior study on 09/27/2022. There is no evidence of a large fiber sensorimotor polyneuropathy.   ___________________________ Nita Sickle, DO    Nerve Conduction Studies   Stim Site NR Peak (ms) Norm Peak (ms) O-P Amp (V) Norm O-P Amp  Left Sup Peroneal Anti Sensory (Ant Lat Mall)  32 C  12 cm    2.9 <4.6 6.6 >4  Right Sup Peroneal Anti Sensory (Ant Lat Mall)  32 C  12 cm    2.8 <4.6 6.1 >4  Left Sural Anti Sensory (Lat Mall)  32 C  Calf    4.2 <4.6 5.3 >4  Right Sural Anti Sensory (Lat Mall)  32 C  Calf    3.0 <4.6 6.7 >4     Stim Site NR Onset (ms) Norm Onset (ms) O-P Amp (mV) Norm O-P Amp Site1 Site2 Delta-0 (ms) Dist (cm) Vel (m/s) Norm Vel (m/s)  Left Peroneal Motor (Ext Dig Brev)  32 C  Ankle    5.0 <6.0 2.7 >2.5 B Fib Ankle 8.3 35.0 42 >40  B Fib    13.3  2.7  Poplt B Fib 1.7 8.0 47 >40  Poplt    15.0  2.6         Right Peroneal Motor (Ext Dig Brev)  32 C  Ankle    4.1 <6.0 4.0 >2.5 B Fib Ankle 8.6 36.0 42 >40  B Fib     12.7  3.7  Poplt B Fib 1.7 8.0 47 >40  Poplt    14.4  3.5         Left Tibial Motor (Abd Hall Brev)  32 C  Ankle    4.0 <6.0 10.0 >4 Knee Ankle 10.6 42.0 40 >40  Knee    14.6  9.0         Right Tibial Motor (Abd Hall Brev)  32 C  Ankle    4.7 <6.0 10.9 >4 Knee Ankle 9.4 42.0 45 >40  Knee    14.1  7.5          Electromyography   Side Muscle Ins.Act Fibs Fasc Recrt Amp Dur Poly Activation Comment  Right AntTibialis Nml Nml Nml Nml Nml Nml Nml Nml N/A  Right Gastroc Nml Nml Nml Nml Nml Nml Nml Nml N/A  Right Flex Dig Long Nml Nml Nml Nml Nml Nml Nml Nml N/A  Right RectFemoris Nml Nml Nml Nml Nml Nml Nml Nml N/A  Right BicepsFemS Nml Nml Nml Nml Nml Nml Nml Nml N/A  Right GluteusMed Nml Nml Nml Nml Nml Nml Nml Nml N/A  Left AntTibialis Nml Nml Nml *1- *1+ *1+ *1+  Nml N/A  Left Gastroc Nml Nml Nml Nml Nml Nml Nml Nml N/A  Left Flex Dig Long Nml Nml Nml *1- *1+ *1+ *1+ Nml N/A  Left RectFemoris Nml Nml Nml Nml Nml Nml Nml Nml N/A  Left GluteusMed Nml Nml Nml *1- *1+ *1+ *1+ Nml N/A      Waveforms:

## 2023-12-20 ENCOUNTER — Telehealth: Payer: Self-pay | Admitting: *Deleted

## 2023-12-20 NOTE — Telephone Encounter (Signed)
 Prior auth submitted for Tramadol  (Key: [id number]) Rx #: [id number].

## 2023-12-21 ENCOUNTER — Telehealth: Payer: Self-pay | Admitting: Registered Nurse

## 2023-12-21 DIAGNOSIS — M5417 Radiculopathy, lumbosacral region: Secondary | ICD-10-CM | POA: Diagnosis not present

## 2023-12-21 DIAGNOSIS — M5416 Radiculopathy, lumbar region: Secondary | ICD-10-CM | POA: Diagnosis not present

## 2023-12-21 DIAGNOSIS — Z6829 Body mass index (BMI) 29.0-29.9, adult: Secondary | ICD-10-CM | POA: Diagnosis not present

## 2023-12-21 NOTE — Telephone Encounter (Signed)
 Approved 12/20/23-06/18/24

## 2023-12-21 NOTE — Telephone Encounter (Signed)
 Patient left voice mail to please call her regarding auth for Tramadol . She only received 7 days of medication

## 2023-12-25 ENCOUNTER — Other Ambulatory Visit (HOSPITAL_COMMUNITY): Payer: Self-pay

## 2023-12-28 DIAGNOSIS — L401 Generalized pustular psoriasis: Secondary | ICD-10-CM | POA: Diagnosis not present

## 2023-12-28 DIAGNOSIS — Z6831 Body mass index (BMI) 31.0-31.9, adult: Secondary | ICD-10-CM | POA: Diagnosis not present

## 2023-12-28 DIAGNOSIS — M5136 Other intervertebral disc degeneration, lumbar region with discogenic back pain only: Secondary | ICD-10-CM | POA: Diagnosis not present

## 2023-12-28 DIAGNOSIS — R5383 Other fatigue: Secondary | ICD-10-CM | POA: Diagnosis not present

## 2023-12-28 DIAGNOSIS — M79644 Pain in right finger(s): Secondary | ICD-10-CM | POA: Diagnosis not present

## 2023-12-28 DIAGNOSIS — Z79899 Other long term (current) drug therapy: Secondary | ICD-10-CM | POA: Diagnosis not present

## 2023-12-28 DIAGNOSIS — M0589 Other rheumatoid arthritis with rheumatoid factor of multiple sites: Secondary | ICD-10-CM | POA: Diagnosis not present

## 2023-12-28 DIAGNOSIS — M1991 Primary osteoarthritis, unspecified site: Secondary | ICD-10-CM | POA: Diagnosis not present

## 2023-12-28 DIAGNOSIS — Z111 Encounter for screening for respiratory tuberculosis: Secondary | ICD-10-CM | POA: Diagnosis not present

## 2023-12-28 DIAGNOSIS — E669 Obesity, unspecified: Secondary | ICD-10-CM | POA: Diagnosis not present

## 2023-12-28 DIAGNOSIS — N183 Chronic kidney disease, stage 3 unspecified: Secondary | ICD-10-CM | POA: Diagnosis not present

## 2024-01-15 DIAGNOSIS — M0589 Other rheumatoid arthritis with rheumatoid factor of multiple sites: Secondary | ICD-10-CM | POA: Diagnosis not present

## 2024-01-23 DIAGNOSIS — F324 Major depressive disorder, single episode, in partial remission: Secondary | ICD-10-CM | POA: Diagnosis not present

## 2024-01-23 DIAGNOSIS — E1142 Type 2 diabetes mellitus with diabetic polyneuropathy: Secondary | ICD-10-CM | POA: Diagnosis not present

## 2024-01-23 DIAGNOSIS — E782 Mixed hyperlipidemia: Secondary | ICD-10-CM | POA: Diagnosis not present

## 2024-01-23 DIAGNOSIS — M069 Rheumatoid arthritis, unspecified: Secondary | ICD-10-CM | POA: Diagnosis not present

## 2024-01-23 DIAGNOSIS — F419 Anxiety disorder, unspecified: Secondary | ICD-10-CM | POA: Diagnosis not present

## 2024-01-23 DIAGNOSIS — I10 Essential (primary) hypertension: Secondary | ICD-10-CM | POA: Diagnosis not present

## 2024-01-23 DIAGNOSIS — G8929 Other chronic pain: Secondary | ICD-10-CM | POA: Diagnosis not present

## 2024-01-23 DIAGNOSIS — J309 Allergic rhinitis, unspecified: Secondary | ICD-10-CM | POA: Diagnosis not present

## 2024-01-23 DIAGNOSIS — K219 Gastro-esophageal reflux disease without esophagitis: Secondary | ICD-10-CM | POA: Diagnosis not present

## 2024-01-23 DIAGNOSIS — E039 Hypothyroidism, unspecified: Secondary | ICD-10-CM | POA: Diagnosis not present

## 2024-01-23 DIAGNOSIS — G4700 Insomnia, unspecified: Secondary | ICD-10-CM | POA: Diagnosis not present

## 2024-01-23 DIAGNOSIS — J4520 Mild intermittent asthma, uncomplicated: Secondary | ICD-10-CM | POA: Diagnosis not present

## 2024-02-12 DIAGNOSIS — M542 Cervicalgia: Secondary | ICD-10-CM | POA: Diagnosis not present

## 2024-02-12 DIAGNOSIS — I10 Essential (primary) hypertension: Secondary | ICD-10-CM | POA: Diagnosis not present

## 2024-02-12 DIAGNOSIS — H35033 Hypertensive retinopathy, bilateral: Secondary | ICD-10-CM | POA: Diagnosis not present

## 2024-02-12 DIAGNOSIS — F324 Major depressive disorder, single episode, in partial remission: Secondary | ICD-10-CM | POA: Diagnosis not present

## 2024-02-12 DIAGNOSIS — E669 Obesity, unspecified: Secondary | ICD-10-CM | POA: Diagnosis not present

## 2024-02-12 DIAGNOSIS — G8929 Other chronic pain: Secondary | ICD-10-CM | POA: Diagnosis not present

## 2024-02-12 DIAGNOSIS — E1142 Type 2 diabetes mellitus with diabetic polyneuropathy: Secondary | ICD-10-CM | POA: Diagnosis not present

## 2024-02-12 DIAGNOSIS — M069 Rheumatoid arthritis, unspecified: Secondary | ICD-10-CM | POA: Diagnosis not present

## 2024-02-12 DIAGNOSIS — G43E01 Chronic migraine with aura, not intractable, with status migrainosus: Secondary | ICD-10-CM | POA: Diagnosis not present

## 2024-03-07 DIAGNOSIS — M0589 Other rheumatoid arthritis with rheumatoid factor of multiple sites: Secondary | ICD-10-CM | POA: Diagnosis not present

## 2024-03-07 DIAGNOSIS — Z79899 Other long term (current) drug therapy: Secondary | ICD-10-CM | POA: Diagnosis not present

## 2024-03-07 DIAGNOSIS — Z6833 Body mass index (BMI) 33.0-33.9, adult: Secondary | ICD-10-CM | POA: Diagnosis not present

## 2024-03-07 DIAGNOSIS — M79644 Pain in right finger(s): Secondary | ICD-10-CM | POA: Diagnosis not present

## 2024-03-07 DIAGNOSIS — R5383 Other fatigue: Secondary | ICD-10-CM | POA: Diagnosis not present

## 2024-03-07 DIAGNOSIS — M1991 Primary osteoarthritis, unspecified site: Secondary | ICD-10-CM | POA: Diagnosis not present

## 2024-03-07 DIAGNOSIS — E669 Obesity, unspecified: Secondary | ICD-10-CM | POA: Diagnosis not present

## 2024-03-07 DIAGNOSIS — N181 Chronic kidney disease, stage 1: Secondary | ICD-10-CM | POA: Diagnosis not present

## 2024-03-07 DIAGNOSIS — M5136 Other intervertebral disc degeneration, lumbar region with discogenic back pain only: Secondary | ICD-10-CM | POA: Diagnosis not present

## 2024-03-07 DIAGNOSIS — L401 Generalized pustular psoriasis: Secondary | ICD-10-CM | POA: Diagnosis not present

## 2024-03-12 DIAGNOSIS — G8929 Other chronic pain: Secondary | ICD-10-CM | POA: Diagnosis not present

## 2024-03-12 DIAGNOSIS — M545 Low back pain, unspecified: Secondary | ICD-10-CM | POA: Diagnosis not present

## 2024-03-12 DIAGNOSIS — E8889 Other specified metabolic disorders: Secondary | ICD-10-CM | POA: Diagnosis not present

## 2024-03-12 DIAGNOSIS — I10 Essential (primary) hypertension: Secondary | ICD-10-CM | POA: Diagnosis not present

## 2024-03-12 DIAGNOSIS — Z6834 Body mass index (BMI) 34.0-34.9, adult: Secondary | ICD-10-CM | POA: Diagnosis not present

## 2024-03-12 DIAGNOSIS — E66811 Obesity, class 1: Secondary | ICD-10-CM | POA: Diagnosis not present

## 2024-03-12 DIAGNOSIS — M069 Rheumatoid arthritis, unspecified: Secondary | ICD-10-CM | POA: Diagnosis not present

## 2024-03-12 DIAGNOSIS — E559 Vitamin D deficiency, unspecified: Secondary | ICD-10-CM | POA: Diagnosis not present

## 2024-03-12 DIAGNOSIS — E039 Hypothyroidism, unspecified: Secondary | ICD-10-CM | POA: Diagnosis not present

## 2024-03-12 DIAGNOSIS — E1142 Type 2 diabetes mellitus with diabetic polyneuropathy: Secondary | ICD-10-CM | POA: Diagnosis not present

## 2024-03-12 DIAGNOSIS — F322 Major depressive disorder, single episode, severe without psychotic features: Secondary | ICD-10-CM | POA: Diagnosis not present

## 2024-03-12 DIAGNOSIS — E782 Mixed hyperlipidemia: Secondary | ICD-10-CM | POA: Diagnosis not present

## 2024-03-13 DIAGNOSIS — I10 Essential (primary) hypertension: Secondary | ICD-10-CM | POA: Diagnosis not present

## 2024-03-13 DIAGNOSIS — M069 Rheumatoid arthritis, unspecified: Secondary | ICD-10-CM | POA: Diagnosis not present

## 2024-03-13 DIAGNOSIS — E1165 Type 2 diabetes mellitus with hyperglycemia: Secondary | ICD-10-CM | POA: Diagnosis not present

## 2024-03-13 DIAGNOSIS — M5417 Radiculopathy, lumbosacral region: Secondary | ICD-10-CM | POA: Diagnosis not present

## 2024-03-13 DIAGNOSIS — E7800 Pure hypercholesterolemia, unspecified: Secondary | ICD-10-CM | POA: Diagnosis not present

## 2024-03-13 DIAGNOSIS — Z9889 Other specified postprocedural states: Secondary | ICD-10-CM | POA: Diagnosis not present

## 2024-03-13 DIAGNOSIS — E039 Hypothyroidism, unspecified: Secondary | ICD-10-CM | POA: Diagnosis not present

## 2024-03-13 DIAGNOSIS — G609 Hereditary and idiopathic neuropathy, unspecified: Secondary | ICD-10-CM | POA: Diagnosis not present

## 2024-03-13 DIAGNOSIS — M461 Sacroiliitis, not elsewhere classified: Secondary | ICD-10-CM | POA: Diagnosis not present

## 2024-03-13 DIAGNOSIS — E049 Nontoxic goiter, unspecified: Secondary | ICD-10-CM | POA: Diagnosis not present

## 2024-03-18 DIAGNOSIS — E039 Hypothyroidism, unspecified: Secondary | ICD-10-CM | POA: Diagnosis not present

## 2024-03-18 DIAGNOSIS — G8929 Other chronic pain: Secondary | ICD-10-CM | POA: Diagnosis not present

## 2024-03-18 DIAGNOSIS — J40 Bronchitis, not specified as acute or chronic: Secondary | ICD-10-CM | POA: Diagnosis not present

## 2024-03-18 DIAGNOSIS — M545 Low back pain, unspecified: Secondary | ICD-10-CM | POA: Diagnosis not present

## 2024-03-18 DIAGNOSIS — E782 Mixed hyperlipidemia: Secondary | ICD-10-CM | POA: Diagnosis not present

## 2024-03-18 DIAGNOSIS — M069 Rheumatoid arthritis, unspecified: Secondary | ICD-10-CM | POA: Diagnosis not present

## 2024-03-18 DIAGNOSIS — G4700 Insomnia, unspecified: Secondary | ICD-10-CM | POA: Diagnosis not present

## 2024-03-18 DIAGNOSIS — E1142 Type 2 diabetes mellitus with diabetic polyneuropathy: Secondary | ICD-10-CM | POA: Diagnosis not present

## 2024-03-18 DIAGNOSIS — I10 Essential (primary) hypertension: Secondary | ICD-10-CM | POA: Diagnosis not present

## 2024-03-18 DIAGNOSIS — K219 Gastro-esophageal reflux disease without esophagitis: Secondary | ICD-10-CM | POA: Diagnosis not present

## 2024-03-18 DIAGNOSIS — J309 Allergic rhinitis, unspecified: Secondary | ICD-10-CM | POA: Diagnosis not present

## 2024-03-18 DIAGNOSIS — J4520 Mild intermittent asthma, uncomplicated: Secondary | ICD-10-CM | POA: Diagnosis not present

## 2024-03-21 ENCOUNTER — Ambulatory Visit

## 2024-03-26 ENCOUNTER — Ambulatory Visit: Admitting: Neurology

## 2024-03-28 ENCOUNTER — Encounter: Payer: Self-pay | Admitting: Physical Therapy

## 2024-03-28 ENCOUNTER — Ambulatory Visit: Attending: Pain Medicine | Admitting: Physical Therapy

## 2024-03-28 ENCOUNTER — Other Ambulatory Visit: Payer: Self-pay

## 2024-03-28 DIAGNOSIS — R293 Abnormal posture: Secondary | ICD-10-CM | POA: Diagnosis not present

## 2024-03-28 DIAGNOSIS — M5459 Other low back pain: Secondary | ICD-10-CM | POA: Insufficient documentation

## 2024-03-28 NOTE — Therapy (Signed)
 OUTPATIENT PHYSICAL THERAPY THORACOLUMBAR EVALUATION   Patient Name: Amy Adams MRN: [id number] DOB:09-27-1966, 58 y.o., female Today's Date: 03/28/2024  END OF SESSION:  PT End of Session - 03/28/24 1134     Visit Number 1    Number of Visits 8    Date for PT Re-Evaluation 04/25/24    PT Start Time 1100    Activity Tolerance Patient limited by pain    Behavior During Therapy [facility] for tasks assessed/performed             Past Medical History:  Diagnosis Date   Anxiety    Arthritis    Chronic foot pain, left    GERD (gastroesophageal reflux disease)    History of anal fissures    Hyperlipidemia    Hypertension    Hypothyroidism    Insulin  dependent type 2 diabetes mellitus (HCC)    endrocrinologist-- dr Ronelle Coffee   Migraines    Peripheral neuropathy    RA (rheumatoid arthritis) [facility])    rheumatologist-  dr Ebbie Goldmann   SUI (stress urinary incontinence, female)    Wears glasses    Past Surgical History:  Procedure Laterality Date   ABDOMINAL HYSTERECTOMY  1990   with Right Salpingo--ophorectomy   ANAL SPHINCTEROTOMY  01-05-2006    dr Toniann Franklin @[facility]    BUNIONECTOMY  09-25-2009   dr Lara Plants @[facility]    right great toe   CARPAL TUNNEL RELEASE Bilateral 1996;  1997   CESAREAN SECTION  1988   FOOT SURGERY  12/ 2014   dr Lara Plants   left 2nd hammertoe repair,  left 2nd metatarsal matrixectomy, left heel endoscopic plantar fasiotomy   IR CATHETER TUBE CHANGE  01/01/2019   IR RADIOLOGIST EVAL & MGMT  11/28/2018   IR RADIOLOGIST EVAL & MGMT  11/29/2018   IR RADIOLOGIST EVAL & MGMT  12/06/2018   IR RADIOLOGIST EVAL & MGMT  12/25/2018   IR RADIOLOGIST EVAL & MGMT  01/08/2019   IR RADIOLOGIST EVAL & MGMT  01/29/2019   PANNICULECTOMY N/A 10/26/2018   Procedure: PANNICULECTOMY;  Surgeon: Jacolyn Matar, MD;  Location: [facility];  Service: General;  Laterality: N/A;   TENDON REPAIR  09/2015   left posterior tibial and peroneal tendon repairs   TOE AMPUTATION  01/2015   right 2nd toe    TONSILLECTOMY  age 21   TRANSOBTURATOR SLING  01-08-2004   dr Inga Manges @[facility]    Patient Active Problem List   Diagnosis Date Noted   Degeneration of lumbar intervertebral disc 03/10/2022   Generalized psoriasis 03/10/2022   Other long term (current) drug therapy 03/10/2022   Overweight 03/10/2022   Pain in limb 03/10/2022   Primary osteoarthritis 03/10/2022   Synovial cyst 03/10/2022   S/P panniculectomy 10/26/2018   Panniculitis 09/04/2018   Hypothyroidism 12/01/2010   Type 2 diabetes mellitus with diabetic polyneuropathy, with long-term current use of insulin  (HCC) 12/01/2010   DIAB W/NEURO MANIFESTS TYPE II/UNS NOT UNCNTRL 12/01/2010   OTHER AND UNSPECIFIED HYPERLIPIDEMIA 12/01/2010   OBESITY, UNSPECIFIED 12/01/2010   ESSENTIAL HYPERTENSION, BENIGN 12/01/2010   Rheumatoid arthritis (HCC) 12/01/2010    REFERRING PROVIDER: Alphonzo Ask MD  REFERRING DIAG: H/o of lumbosacral spine surgery  Rationale for Evaluation and Treatment: Rehabilitation  THERAPY DIAG:  Other low back pain  ONSET DATE: Chronic  SUBJECTIVE:  SUBJECTIVE STATEMENT: The patient returns to PT (last seen 09/05/23) with continued c/o of low back pain.  She rates her pain at a 7/10 that can increase to higher levels with certain movements.  She has not found anything really helps decrease her pain.    PERTINENT HISTORY:  See above  Lumbar L4-L5 fusion, laminectomy Removal of cyst  PAIN:  Are you having pain? Yes: NPRS scale: 7/10. Pain location: LB/SIJ's.   Pain description: Throbbing, sharp and radiates. Aggravating factors: See above. Relieving factors: See above.    PRECAUTIONS: Fall.  Recommended patient use a cane for additional safety.  RED FLAGS: None   WEIGHT BEARING RESTRICTIONS: No  FALLS:  Has  patient fallen in last 6 months? Yes. Number of falls 2.  Leg gave way.  LIVING ENVIRONMENT: Lives with: lives with their family Lives in: House/apartment Has following equipment at home: None  PATIENT GOALS: Get out of pain and be able to perform ADL's like she did before the onset of pain.      OBJECTIVE:  Note: Objective measures were completed at Evaluation unless otherwise noted.  PATIENT SURVEYS:  Modified Oswestry 33/50.   POSTURE: decreased lumbar lordosis  PALPATION: Very tender to palpation over both SIJ's.  LUMBAR ROM:   Active lumbar flexion limited by 50% and patient's has to use her hands on her quads to assist herself back to an upright posture.  Extension actively is 15 degrees.   LOWER EXTREMITY MMT:    Patient able to provide a normal strength grade via manual muscle testing for bilateral knees and ankles.    LUMBAR SPECIAL TESTS:  Equal leg lengths.     GAIT: Antalgic gait.    TREATMENT DATE: 03/28/24:  HMP and IFC at 80-150 Hz on 40% scan x 20 minutes while seated to patient's bilateral L-S region.  Normal modality response following removal of modality.                                                                                                                                  PATIENT EDUCATION:  Education details:  Person educated:  International aid/development worker:  Education comprehension:   HOME EXERCISE PROGRAM:   ASSESSMENT:  CLINICAL IMPRESSION: The patient presents to [facility] with chronic low back pain and a h/o of lumbar fusion.  She reports high daily pain-levels and cannot perform ADL's like she was able prior to the onset of her pain.  She is very tender to palpation over both SIJ's.  Her gait is antalgic in nature.  Her Modified Owestry score is 33/50.  Patient will benefit from skilled physical therapy intervention to address pain and deficits.   OBJECTIVE IMPAIRMENTS: Abnormal gait, decreased activity tolerance, decreased ROM, increased  muscle spasms, postural dysfunction, and pain.   ACTIVITY LIMITATIONS: carrying, lifting, bending, and locomotion level  PARTICIPATION LIMITATIONS: meal prep, cleaning, laundry, community activity, and yard work  PERSONAL FACTORS: Time since onset  of injury/illness/exacerbation and 1 comorbidity: lumbar fusion are also affecting patient's functional outcome.   REHAB POTENTIAL: Fair minus  CLINICAL DECISION MAKING: Evolving/moderate complexity  EVALUATION COMPLEXITY: Moderate   GOALS:  SHORT TERM GOALS: Target date: 04/25/24.  Ind with HEP. Goal status: INITIAL  2.  Perform ADL's with pain not > 4-5/10.  Goal status: INITIAL  3.  Walk a community distance with pain not > 4-5/10.  Goal status: INITIAL   PLAN:  PT FREQUENCY: 2x/week  PT DURATION: 4 weeks  PLANNED INTERVENTIONS: 97110-Therapeutic exercises, 97530- Therapeutic activity, [id number]- Neuromuscular re-education, 97535- Self Care, 40981- Manual therapy, G0283- Electrical stimulation (unattended), 97035- Ultrasound, Patient/Family education, Dry Needling, Cryotherapy, and Moist heat.  PLAN FOR NEXT SESSION:  Combo e'stim/US  at 1.50 W/CM2, STW/M, core exercise progression, spinal protection techniques and body mechanics training.    Chyan Carnero, [place], PT 03/28/2024, 12:09 PM

## 2024-03-29 DIAGNOSIS — E039 Hypothyroidism, unspecified: Secondary | ICD-10-CM | POA: Diagnosis not present

## 2024-03-29 DIAGNOSIS — M0589 Other rheumatoid arthritis with rheumatoid factor of multiple sites: Secondary | ICD-10-CM | POA: Diagnosis not present

## 2024-03-29 DIAGNOSIS — M79644 Pain in right finger(s): Secondary | ICD-10-CM | POA: Diagnosis not present

## 2024-03-29 DIAGNOSIS — Z6832 Body mass index (BMI) 32.0-32.9, adult: Secondary | ICD-10-CM | POA: Diagnosis not present

## 2024-03-29 DIAGNOSIS — E669 Obesity, unspecified: Secondary | ICD-10-CM | POA: Diagnosis not present

## 2024-03-29 DIAGNOSIS — E782 Mixed hyperlipidemia: Secondary | ICD-10-CM | POA: Diagnosis not present

## 2024-04-01 DIAGNOSIS — N181 Chronic kidney disease, stage 1: Secondary | ICD-10-CM | POA: Diagnosis not present

## 2024-04-01 DIAGNOSIS — E782 Mixed hyperlipidemia: Secondary | ICD-10-CM | POA: Diagnosis not present

## 2024-04-01 DIAGNOSIS — M0589 Other rheumatoid arthritis with rheumatoid factor of multiple sites: Secondary | ICD-10-CM | POA: Diagnosis not present

## 2024-04-01 DIAGNOSIS — E039 Hypothyroidism, unspecified: Secondary | ICD-10-CM | POA: Diagnosis not present

## 2024-04-09 DIAGNOSIS — M0589 Other rheumatoid arthritis with rheumatoid factor of multiple sites: Secondary | ICD-10-CM | POA: Diagnosis not present

## 2024-04-09 DIAGNOSIS — Z6832 Body mass index (BMI) 32.0-32.9, adult: Secondary | ICD-10-CM | POA: Diagnosis not present

## 2024-04-09 DIAGNOSIS — E669 Obesity, unspecified: Secondary | ICD-10-CM | POA: Diagnosis not present

## 2024-04-09 DIAGNOSIS — E1142 Type 2 diabetes mellitus with diabetic polyneuropathy: Secondary | ICD-10-CM | POA: Diagnosis not present

## 2024-04-09 DIAGNOSIS — I10 Essential (primary) hypertension: Secondary | ICD-10-CM | POA: Diagnosis not present

## 2024-04-09 DIAGNOSIS — Z6833 Body mass index (BMI) 33.0-33.9, adult: Secondary | ICD-10-CM | POA: Diagnosis not present

## 2024-04-09 DIAGNOSIS — E782 Mixed hyperlipidemia: Secondary | ICD-10-CM | POA: Diagnosis not present

## 2024-04-09 DIAGNOSIS — Z79899 Other long term (current) drug therapy: Secondary | ICD-10-CM | POA: Diagnosis not present

## 2024-04-09 DIAGNOSIS — M1991 Primary osteoarthritis, unspecified site: Secondary | ICD-10-CM | POA: Diagnosis not present

## 2024-04-09 DIAGNOSIS — M5136 Other intervertebral disc degeneration, lumbar region with discogenic back pain only: Secondary | ICD-10-CM | POA: Diagnosis not present

## 2024-04-09 DIAGNOSIS — M79644 Pain in right finger(s): Secondary | ICD-10-CM | POA: Diagnosis not present

## 2024-04-09 DIAGNOSIS — L401 Generalized pustular psoriasis: Secondary | ICD-10-CM | POA: Diagnosis not present

## 2024-04-09 DIAGNOSIS — N181 Chronic kidney disease, stage 1: Secondary | ICD-10-CM | POA: Diagnosis not present

## 2024-04-09 DIAGNOSIS — E66811 Obesity, class 1: Secondary | ICD-10-CM | POA: Diagnosis not present

## 2024-04-15 DIAGNOSIS — E1165 Type 2 diabetes mellitus with hyperglycemia: Secondary | ICD-10-CM | POA: Diagnosis not present

## 2024-04-15 DIAGNOSIS — E782 Mixed hyperlipidemia: Secondary | ICD-10-CM | POA: Diagnosis not present

## 2024-04-15 DIAGNOSIS — E049 Nontoxic goiter, unspecified: Secondary | ICD-10-CM | POA: Diagnosis not present

## 2024-04-15 DIAGNOSIS — E7800 Pure hypercholesterolemia, unspecified: Secondary | ICD-10-CM | POA: Diagnosis not present

## 2024-04-15 DIAGNOSIS — E039 Hypothyroidism, unspecified: Secondary | ICD-10-CM | POA: Diagnosis not present

## 2024-04-15 DIAGNOSIS — M069 Rheumatoid arthritis, unspecified: Secondary | ICD-10-CM | POA: Diagnosis not present

## 2024-04-15 DIAGNOSIS — N390 Urinary tract infection, site not specified: Secondary | ICD-10-CM | POA: Diagnosis not present

## 2024-04-15 DIAGNOSIS — M545 Low back pain, unspecified: Secondary | ICD-10-CM | POA: Diagnosis not present

## 2024-04-15 DIAGNOSIS — I10 Essential (primary) hypertension: Secondary | ICD-10-CM | POA: Diagnosis not present

## 2024-04-15 DIAGNOSIS — E1142 Type 2 diabetes mellitus with diabetic polyneuropathy: Secondary | ICD-10-CM | POA: Diagnosis not present

## 2024-04-15 DIAGNOSIS — G609 Hereditary and idiopathic neuropathy, unspecified: Secondary | ICD-10-CM | POA: Diagnosis not present

## 2024-04-16 ENCOUNTER — Encounter: Payer: Self-pay | Admitting: *Deleted

## 2024-04-16 ENCOUNTER — Ambulatory Visit: Attending: Pain Medicine | Admitting: *Deleted

## 2024-04-16 DIAGNOSIS — R293 Abnormal posture: Secondary | ICD-10-CM | POA: Insufficient documentation

## 2024-04-16 DIAGNOSIS — M6281 Muscle weakness (generalized): Secondary | ICD-10-CM | POA: Insufficient documentation

## 2024-04-16 DIAGNOSIS — M5459 Other low back pain: Secondary | ICD-10-CM | POA: Diagnosis not present

## 2024-04-16 DIAGNOSIS — M5416 Radiculopathy, lumbar region: Secondary | ICD-10-CM | POA: Insufficient documentation

## 2024-04-16 NOTE — Therapy (Signed)
 OUTPATIENT PHYSICAL THERAPY THORACOLUMBAR TREATMENT   Patient Name: Amy Adams MRN: [id number] DOB:1966/06/27, 58 y.o., female Today's Date: 04/16/2024  END OF SESSION:  PT End of Session - 04/16/24 1120     Visit Number 2    Number of Visits 8    Date for PT Re-Evaluation 04/25/24    PT Start Time 1100    PT Stop Time 1150    PT Time Calculation (min) 50 min             Past Medical History:  Diagnosis Date   Anxiety    Arthritis    Chronic foot pain, left    GERD (gastroesophageal reflux disease)    History of anal fissures    Hyperlipidemia    Hypertension    Hypothyroidism    Insulin  dependent type 2 diabetes mellitus (HCC)    endrocrinologist-- dr Ronelle Coffee   Migraines    Peripheral neuropathy    RA (rheumatoid arthritis) [facility])    rheumatologist-  dr Ebbie Goldmann   SUI (stress urinary incontinence, female)    Wears glasses    Past Surgical History:  Procedure Laterality Date   ABDOMINAL HYSTERECTOMY  1990   with Right Salpingo--ophorectomy   ANAL SPHINCTEROTOMY  01-05-2006    dr Toniann Franklin @[facility]    BUNIONECTOMY  09-25-2009   dr Lara Plants @[facility]    right great toe   CARPAL TUNNEL RELEASE Bilateral 1996;  1997   CESAREAN SECTION  1988   FOOT SURGERY  12/ 2014   dr Lara Plants   left 2nd hammertoe repair,  left 2nd metatarsal matrixectomy, left heel endoscopic plantar fasiotomy   IR CATHETER TUBE CHANGE  01/01/2019   IR RADIOLOGIST EVAL & MGMT  11/28/2018   IR RADIOLOGIST EVAL & MGMT  11/29/2018   IR RADIOLOGIST EVAL & MGMT  12/06/2018   IR RADIOLOGIST EVAL & MGMT  12/25/2018   IR RADIOLOGIST EVAL & MGMT  01/08/2019   IR RADIOLOGIST EVAL & MGMT  01/29/2019   PANNICULECTOMY N/A 10/26/2018   Procedure: PANNICULECTOMY;  Surgeon: Jacolyn Matar, MD;  Location: [facility];  Service: General;  Laterality: N/A;   TENDON REPAIR  09/2015   left posterior tibial and peroneal tendon repairs   TOE AMPUTATION  01/2015   right 2nd toe   TONSILLECTOMY  age 35   TRANSOBTURATOR SLING   01-08-2004   dr Inga Manges @[facility]    Patient Active Problem List   Diagnosis Date Noted   Degeneration of lumbar intervertebral disc 03/10/2022   Generalized psoriasis 03/10/2022   Other long term (current) drug therapy 03/10/2022   Overweight 03/10/2022   Pain in limb 03/10/2022   Primary osteoarthritis 03/10/2022   Synovial cyst 03/10/2022   S/P panniculectomy 10/26/2018   Panniculitis 09/04/2018   Hypothyroidism 12/01/2010   Type 2 diabetes mellitus with diabetic polyneuropathy, with long-term current use of insulin  (HCC) 12/01/2010   DIAB W/NEURO MANIFESTS TYPE II/UNS NOT UNCNTRL 12/01/2010   OTHER AND UNSPECIFIED HYPERLIPIDEMIA 12/01/2010   OBESITY, UNSPECIFIED 12/01/2010   ESSENTIAL HYPERTENSION, BENIGN 12/01/2010   Rheumatoid arthritis (HCC) 12/01/2010    REFERRING PROVIDER: Alphonzo Ask MD  REFERRING DIAG: H/o of lumbosacral spine surgery  Rationale for Evaluation and Treatment: Rehabilitation  THERAPY DIAG:  Other low back pain  Abnormal posture  Radiculopathy, lumbar region  Muscle weakness (generalized)  ONSET DATE: Chronic  SUBJECTIVE:  SUBJECTIVE STATEMENT: The patient  rates her pain at a 7/10 Bil SIJ's  She has not found anything really helps decrease her pain.    PERTINENT HISTORY:  See above  Lumbar L4-L5 fusion, laminectomy Removal of cyst  PAIN:  Are you having pain? Yes: NPRS scale: 7/10. Pain location: LB/SIJ's.   Pain description: Throbbing, sharp and radiates. Aggravating factors: See above. Relieving factors: See above.    PRECAUTIONS: Fall.  Recommended patient use a cane for additional safety.  RED FLAGS: None   WEIGHT BEARING RESTRICTIONS: No  FALLS:  Has patient fallen in last 6 months? Yes. Number of falls 2.  Leg gave way.  LIVING  ENVIRONMENT: Lives with: lives with their family Lives in: House/apartment Has following equipment at home: None  PATIENT GOALS: Get out of pain and be able to perform ADL's like she did before the onset of pain.      OBJECTIVE:  Note: Objective measures were completed at Evaluation unless otherwise noted.  PATIENT SURVEYS:  Modified Oswestry 33/50.   POSTURE: decreased lumbar lordosis  PALPATION: Very tender to palpation over both SIJ's.  LUMBAR ROM:   Active lumbar flexion limited by 50% and patient's has to use her hands on her quads to assist herself back to an upright posture.  Extension actively is 15 degrees.   LOWER EXTREMITY MMT:    Patient able to provide a normal strength grade via manual muscle testing for bilateral knees and ankles.    LUMBAR SPECIAL TESTS:  Equal leg lengths.     GAIT: Antalgic gait.    TREATMENT DATE: 04/16/24: US  combo 1.5 w/cm2 x 8 mins each SIJ while seated STW to Bil SIJs and LB paras while seated   HMP and IFC at 80-150 Hz on 40% scan x 20 minutes while seated to patient's bilateral L-S region.  Normal modality response following removal of modality.                                                                                                                                  PATIENT EDUCATION:  Education details:  Person educated:  International aid/development worker:  Education comprehension:   HOME EXERCISE PROGRAM:   ASSESSMENT:  CLINICAL IMPRESSION: The patient presents to [facility] with chronic low back pain and a h/o of lumbar fusion and Bil. SIJ pain. US  combo, and STW performed to Bil LB paras and SIJs in sitting. Notable tenderness both sides SIJs during STW. Pt doing about the same after Rx.  OBJECTIVE IMPAIRMENTS: Abnormal gait, decreased activity tolerance, decreased ROM, increased muscle spasms, postural dysfunction, and pain.   ACTIVITY LIMITATIONS: carrying, lifting, bending, and locomotion level  PARTICIPATION LIMITATIONS:  meal prep, cleaning, laundry, community activity, and yard work  PERSONAL FACTORS: Time since onset of injury/illness/exacerbation and 1 comorbidity: lumbar fusion are also affecting patient's functional outcome.   REHAB POTENTIAL: Fair minus  CLINICAL DECISION MAKING: Evolving/moderate complexity  EVALUATION COMPLEXITY: Moderate  GOALS:  SHORT TERM GOALS: Target date: 04/25/24.  Ind with HEP. Goal status: INITIAL  2.  Perform ADL's with pain not > 4-5/10.  Goal status: INITIAL  3.  Walk a community distance with pain not > 4-5/10.  Goal status: INITIAL   PLAN:  PT FREQUENCY: 2x/week  PT DURATION: 4 weeks  PLANNED INTERVENTIONS: 97110-Therapeutic exercises, 97530- Therapeutic activity, [id number]- Neuromuscular re-education, 97535- Self Care, 16109- Manual therapy, G0283- Electrical stimulation (unattended), 97035- Ultrasound, Patient/Family education, Dry Needling, Cryotherapy, and Moist heat.  PLAN FOR NEXT SESSION:  Combo e'stim/US  at 1.50 W/CM2, STW/M, core exercise progression, spinal protection techniques and body mechanics training.    Mclean Moya,CHRIS, PTA 04/16/2024, 12:57 PM

## 2024-04-23 ENCOUNTER — Ambulatory Visit: Admitting: Physical Therapy

## 2024-04-23 DIAGNOSIS — R293 Abnormal posture: Secondary | ICD-10-CM

## 2024-04-23 DIAGNOSIS — M5459 Other low back pain: Secondary | ICD-10-CM

## 2024-04-23 DIAGNOSIS — M6281 Muscle weakness (generalized): Secondary | ICD-10-CM | POA: Diagnosis not present

## 2024-04-23 DIAGNOSIS — M5416 Radiculopathy, lumbar region: Secondary | ICD-10-CM | POA: Diagnosis not present

## 2024-04-23 NOTE — Therapy (Signed)
 OUTPATIENT PHYSICAL THERAPY THORACOLUMBAR TREATMENT   Patient Name: Amy Adams MRN: [id number] DOB:[birth year], 58 y.o., female Today's Date: 04/23/2024  END OF SESSION:  PT End of Session - 04/23/24 1138     Visit Number 3    Number of Visits 8    Date for PT Re-Evaluation 04/25/24    PT Start Time 1100    Activity Tolerance Patient tolerated treatment well    Behavior During Therapy WFL for tasks assessed/performed              Past Medical History:  Diagnosis Date   Anxiety    Arthritis    Chronic foot pain, left    GERD (gastroesophageal reflux disease)    History of anal fissures    Hyperlipidemia    Hypertension    Hypothyroidism    Insulin  dependent type 2 diabetes mellitus (HCC)    endrocrinologist-- dr Ronelle Coffee   Migraines    Peripheral neuropathy    RA (rheumatoid arthritis) [facility])    rheumatologist-  dr Ebbie Goldmann   SUI (stress urinary incontinence, female)    Wears glasses    Past Surgical History:  Procedure Laterality Date   ABDOMINAL HYSTERECTOMY  1990   with Right Salpingo--ophorectomy   ANAL SPHINCTEROTOMY  01-05-2006    dr Toniann Franklin @[facility]    BUNIONECTOMY  09-25-2009   dr Lara Plants @[facility]    right great toe   CARPAL TUNNEL RELEASE Bilateral 1996;  1997   CESAREAN SECTION  1988   FOOT SURGERY  12/ 2014   dr Lara Plants   left 2nd hammertoe repair,  left 2nd metatarsal matrixectomy, left heel endoscopic plantar fasiotomy   IR CATHETER TUBE CHANGE  01/01/2019   IR RADIOLOGIST EVAL & MGMT  11/28/2018   IR RADIOLOGIST EVAL & MGMT  11/29/2018   IR RADIOLOGIST EVAL & MGMT  12/06/2018   IR RADIOLOGIST EVAL & MGMT  12/25/2018   IR RADIOLOGIST EVAL & MGMT  01/08/2019   IR RADIOLOGIST EVAL & MGMT  01/29/2019   PANNICULECTOMY N/A 10/26/2018   Procedure: PANNICULECTOMY;  Surgeon: Jacolyn Matar, MD;  Location: [facility];  Service: General;  Laterality: N/A;   TENDON REPAIR  09/2015   left posterior tibial and peroneal tendon repairs   TOE AMPUTATION  01/2015   right 2nd  toe   TONSILLECTOMY  age 12   TRANSOBTURATOR SLING  01-08-2004   dr Inga Manges @[facility]    Patient Active Problem List   Diagnosis Date Noted   Degeneration of lumbar intervertebral disc 03/10/2022   Generalized psoriasis 03/10/2022   Other long term (current) drug therapy 03/10/2022   Overweight 03/10/2022   Pain in limb 03/10/2022   Primary osteoarthritis 03/10/2022   Synovial cyst 03/10/2022   S/P panniculectomy 10/26/2018   Panniculitis 09/04/2018   Hypothyroidism 12/01/2010   Type 2 diabetes mellitus with diabetic polyneuropathy, with long-term current use of insulin  (HCC) 12/01/2010   DIAB W/NEURO MANIFESTS TYPE II/UNS NOT UNCNTRL 12/01/2010   OTHER AND UNSPECIFIED HYPERLIPIDEMIA 12/01/2010   OBESITY, UNSPECIFIED 12/01/2010   ESSENTIAL HYPERTENSION, BENIGN 12/01/2010   Rheumatoid arthritis (HCC) 12/01/2010    REFERRING PROVIDER: Alphonzo Ask MD  REFERRING DIAG: H/o of lumbosacral spine surgery  Rationale for Evaluation and Treatment: Rehabilitation  THERAPY DIAG:  Other low back pain  Abnormal posture  ONSET DATE: Chronic  SUBJECTIVE:  SUBJECTIVE STATEMENT: Patient enjoyed her last treatment.  Pain at a 6 today.    PERTINENT HISTORY:  See above  Lumbar L4-L5 fusion, laminectomy Removal of cyst  PAIN:  Are you having pain? Yes: NPRS scale: 6/10. Pain location: LB/SIJ's.   Pain description: Throbbing, sharp and radiates. Aggravating factors: See above. Relieving factors: See above.    PRECAUTIONS: Fall.  Recommended patient use a cane for additional safety.  RED FLAGS: None   WEIGHT BEARING RESTRICTIONS: No  FALLS:  Has patient fallen in last 6 months? Yes. Number of falls 2.  Leg gave way.  LIVING ENVIRONMENT: Lives with: lives with their family Lives in:  House/apartment Has following equipment at home: None  PATIENT GOALS: Get out of pain and be able to perform ADL's like she did before the onset of pain.      OBJECTIVE:  Note: Objective measures were completed at Evaluation unless otherwise noted.  PATIENT SURVEYS:  Modified Oswestry 33/50.   POSTURE: decreased lumbar lordosis  PALPATION: Very tender to palpation over both SIJ's.  LUMBAR ROM:   Active lumbar flexion limited by 50% and patient's has to use her hands on her quads to assist herself back to an upright posture.  Extension actively is 15 degrees.   LOWER EXTREMITY MMT:    Patient able to provide a normal strength grade via manual muscle testing for bilateral knees and ankles.    LUMBAR SPECIAL TESTS:  Equal leg lengths.     GAIT: Antalgic gait.    TREATMENT DATE:   04/23/24:  US  combo 1.5 w/cm2 x 6 mins each SIJ while seated (12 minutes total) f/b  STW to Bil SIJs and LB paras while seated x 12 minutes f/b  IFC at 80-150 Hz on 40% scan x 15 minutes while seated to patient's bilateral L-S region.  Normal modality response following removal of modality.  04/16/24: US  combo 1.5 w/cm2 x 8 mins each SIJ while seated STW to Bil SIJs and LB paras while seated   HMP and IFC at 80-150 Hz on 40% scan x 20 minutes while seated to patient's bilateral L-S region.  Normal modality response following removal of modality.                                                                                                                                  PATIENT EDUCATION:  Education details:  Person educated:  International aid/development worker:  Education comprehension:   HOME EXERCISE PROGRAM:   ASSESSMENT:  CLINICAL IMPRESSION: Patient with increased tone over bilateral lumbar erector spinae musculature and TTP over both SIJ's.  She tolerated treatment today without complaint.    OBJECTIVE IMPAIRMENTS: Abnormal gait, decreased activity tolerance, decreased ROM, increased muscle  spasms, postural dysfunction, and pain.   ACTIVITY LIMITATIONS: carrying, lifting, bending, and locomotion level  PARTICIPATION LIMITATIONS: meal prep, cleaning, laundry, community activity, and yard work  PERSONAL FACTORS: Time since onset of injury/illness/exacerbation and  1 comorbidity: lumbar fusion are also affecting patient's functional outcome.   REHAB POTENTIAL: Fair minus  CLINICAL DECISION MAKING: Evolving/moderate complexity  EVALUATION COMPLEXITY: Moderate   GOALS:  SHORT TERM GOALS: Target date: 04/25/24.  Ind with HEP. Goal status: INITIAL  2.  Perform ADL's with pain not > 4-5/10.  Goal status: INITIAL  3.  Walk a community distance with pain not > 4-5/10.  Goal status: INITIAL   PLAN:  PT FREQUENCY: 2x/week  PT DURATION: 4 weeks  PLANNED INTERVENTIONS: 97110-Therapeutic exercises, 97530- Therapeutic activity, [id number]- Neuromuscular re-education, 97535- Self Care, 40981- Manual therapy, G0283- Electrical stimulation (unattended), 97035- Ultrasound, Patient/Family education, Dry Needling, Cryotherapy, and Moist heat.  PLAN FOR NEXT SESSION:  Combo e'stim/US  at 1.50 W/CM2, STW/M, core exercise progression, spinal protection techniques and body mechanics training.    Artur Winningham, [place], PT 04/23/2024, 11:42 AM

## 2024-04-29 ENCOUNTER — Ambulatory Visit: Admitting: Physical Therapy

## 2024-04-29 DIAGNOSIS — M6281 Muscle weakness (generalized): Secondary | ICD-10-CM

## 2024-04-29 DIAGNOSIS — M5416 Radiculopathy, lumbar region: Secondary | ICD-10-CM | POA: Diagnosis not present

## 2024-04-29 DIAGNOSIS — M5459 Other low back pain: Secondary | ICD-10-CM

## 2024-04-29 DIAGNOSIS — R293 Abnormal posture: Secondary | ICD-10-CM

## 2024-04-29 NOTE — Therapy (Signed)
 OUTPATIENT PHYSICAL THERAPY THORACOLUMBAR TREATMENT   Patient Name: Amy Adams MRN: [id number] DOB:Apr 02, 1966, 58 y.o., female Today's Date: 04/29/2024  END OF SESSION:  PT End of Session - 04/29/24 1503     Visit Number 4    Number of Visits 8    Date for PT Re-Evaluation 04/25/24    PT Start Time 0145    PT Stop Time 0238    PT Time Calculation (min) 53 min    Activity Tolerance Patient tolerated treatment well    Behavior During Therapy WFL for tasks assessed/performed           Past Medical History:  Diagnosis Date   Anxiety    Arthritis    Chronic foot pain, left    GERD (gastroesophageal reflux disease)    History of anal fissures    Hyperlipidemia    Hypertension    Hypothyroidism    Insulin  dependent type 2 diabetes mellitus (HCC)    endrocrinologist-- dr Ronelle Coffee   Migraines    Peripheral neuropathy    RA (rheumatoid arthritis) [facility])    rheumatologist-  dr Ebbie Goldmann   SUI (stress urinary incontinence, female)    Wears glasses    Past Surgical History:  Procedure Laterality Date   ABDOMINAL HYSTERECTOMY  1990   with Right Salpingo--ophorectomy   ANAL SPHINCTEROTOMY  01-05-2006    dr Toniann Franklin @[facility]    BUNIONECTOMY  09-25-2009   dr Lara Plants @[facility]    right great toe   CARPAL TUNNEL RELEASE Bilateral 1996;  1997   CESAREAN SECTION  1988   FOOT SURGERY  12/ 2014   dr Lara Plants   left 2nd hammertoe repair,  left 2nd metatarsal matrixectomy, left heel endoscopic plantar fasiotomy   IR CATHETER TUBE CHANGE  01/01/2019   IR RADIOLOGIST EVAL & MGMT  11/28/2018   IR RADIOLOGIST EVAL & MGMT  11/29/2018   IR RADIOLOGIST EVAL & MGMT  12/06/2018   IR RADIOLOGIST EVAL & MGMT  12/25/2018   IR RADIOLOGIST EVAL & MGMT  01/08/2019   IR RADIOLOGIST EVAL & MGMT  01/29/2019   PANNICULECTOMY N/A 10/26/2018   Procedure: PANNICULECTOMY;  Surgeon: Jacolyn Matar, MD;  Location: [facility];  Service: General;  Laterality: N/A;   TENDON REPAIR  09/2015   left posterior tibial and peroneal  tendon repairs   TOE AMPUTATION  01/2015   right 2nd toe   TONSILLECTOMY  age 8   TRANSOBTURATOR SLING  01-08-2004   dr Inga Manges @[facility]    Patient Active Problem List   Diagnosis Date Noted   Degeneration of lumbar intervertebral disc 03/10/2022   Generalized psoriasis 03/10/2022   Other long term (current) drug therapy 03/10/2022   Overweight 03/10/2022   Pain in limb 03/10/2022   Primary osteoarthritis 03/10/2022   Synovial cyst 03/10/2022   S/P panniculectomy 10/26/2018   Panniculitis 09/04/2018   Hypothyroidism 12/01/2010   Type 2 diabetes mellitus with diabetic polyneuropathy, with long-term current use of insulin  (HCC) 12/01/2010   DIAB W/NEURO MANIFESTS TYPE II/UNS NOT UNCNTRL 12/01/2010   OTHER AND UNSPECIFIED HYPERLIPIDEMIA 12/01/2010   OBESITY, UNSPECIFIED 12/01/2010   ESSENTIAL HYPERTENSION, BENIGN 12/01/2010   Rheumatoid arthritis (HCC) 12/01/2010    REFERRING PROVIDER: Alphonzo Ask MD  REFERRING DIAG: H/o of lumbosacral spine surgery  Rationale for Evaluation and Treatment: Rehabilitation  THERAPY DIAG:  Other low back pain  Abnormal posture  Radiculopathy, lumbar region  Muscle weakness (generalized)  ONSET DATE: Chronic  SUBJECTIVE:  SUBJECTIVE STATEMENT: Couldn't sleep last night due to pain.  Pain a 6.5 right now.  PERTINENT HISTORY:  See above  Lumbar L4-L5 fusion, laminectomy Removal of cyst  PAIN:  Are you having pain? Yes: NPRS scale: 6.5/10. Pain location: LB/SIJ's.   Pain description: Throbbing, sharp and radiates. Aggravating factors: See above. Relieving factors: See above.    PRECAUTIONS: Fall.  Recommended patient use a cane for additional safety.  RED FLAGS: None   WEIGHT BEARING RESTRICTIONS: No  FALLS:  Has patient fallen in last 6  months? Yes. Number of falls 2.  Leg gave way.  LIVING ENVIRONMENT: Lives with: lives with their family Lives in: House/apartment Has following equipment at home: None  PATIENT GOALS: Get out of pain and be able to perform ADL's like she did before the onset of pain.      OBJECTIVE:  Note: Objective measures were completed at Evaluation unless otherwise noted.  PATIENT SURVEYS:  Modified Oswestry 33/50.   POSTURE: decreased lumbar lordosis  PALPATION: Very tender to palpation over both SIJ's.  LUMBAR ROM:   Active lumbar flexion limited by 50% and patient's has to use her hands on her quads to assist herself back to an upright posture.  Extension actively is 15 degrees.   LOWER EXTREMITY MMT:    Patient able to provide a normal strength grade via manual muscle testing for bilateral knees and ankles.    LUMBAR SPECIAL TESTS:  Equal leg lengths.     GAIT: Antalgic gait.    TREATMENT DATE:   04/29/24:  US /E'stim  combo 1.5 w/cm2 x 6 mins each SIJ while seated (12 minutes total) f/b  STW to Bil SIJs and LB paras while seated x 11 minutes f/b  IFC at 80-150 Hz on 40% scan x 20 minutes while seated to patient's bilateral L-S region.  Normal modality response following removal of modality.  04/23/24:  US  combo 1.5 w/cm2 x 6 mins each SIJ while seated (12 minutes total) f/b  STW to Bil SIJs and LB paras while seated x 12 minutes f/b  IFC at 80-150 Hz on 40% scan x 15 minutes while seated to patient's bilateral L-S region.  Normal modality response following removal of modality.  04/16/24: US  combo 1.5 w/cm2 x 8 mins each SIJ while seated STW to Bil SIJs and LB paras while seated   HMP and IFC at 80-150 Hz on 40% scan x 20 minutes while seated to patient's bilateral L-S region.  Normal modality response following removal of modality.                                                                                                                                  PATIENT  EDUCATION:  Education details:  Person educated:  International aid/development worker:  Education comprehension:   HOME EXERCISE PROGRAM:   ASSESSMENT:  CLINICAL IMPRESSION: Patient continues to receive only temporary pain relief from treatments.  She is  planning to contact her PCP regarding whether she should continue treatment.  She will call the clinic.  OBJECTIVE IMPAIRMENTS: Abnormal gait, decreased activity tolerance, decreased ROM, increased muscle spasms, postural dysfunction, and pain.   ACTIVITY LIMITATIONS: carrying, lifting, bending, and locomotion level  PARTICIPATION LIMITATIONS: meal prep, cleaning, laundry, community activity, and yard work  PERSONAL FACTORS: Time since onset of injury/illness/exacerbation and 1 comorbidity: lumbar fusion are also affecting patient's functional outcome.   REHAB POTENTIAL: Fair minus  CLINICAL DECISION MAKING: Evolving/moderate complexity  EVALUATION COMPLEXITY: Moderate   GOALS:  SHORT TERM GOALS: Target date: 04/25/24.  Ind with HEP. Goal status: INITIAL  2.  Perform ADL's with pain not > 4-5/10.  Goal status: INITIAL  3.  Walk a community distance with pain not > 4-5/10.  Goal status: INITIAL   PLAN:  PT FREQUENCY: 2x/week  PT DURATION: 4 weeks  PLANNED INTERVENTIONS: 97110-Therapeutic exercises, 97530- Therapeutic activity, [id number]- Neuromuscular re-education, 97535- Self Care, 65784- Manual therapy, G0283- Electrical stimulation (unattended), 97035- Ultrasound, Patient/Family education, Dry Needling, Cryotherapy, and Moist heat.  PLAN FOR NEXT SESSION:  Combo e'stim/US  at 1.50 W/CM2, STW/M, core exercise progression, spinal protection techniques and body mechanics training.    Kashton Mcartor, [place], PT 04/29/2024, 3:08 PM

## 2024-04-30 DIAGNOSIS — M0589 Other rheumatoid arthritis with rheumatoid factor of multiple sites: Secondary | ICD-10-CM | POA: Diagnosis not present

## 2024-05-07 DIAGNOSIS — Z6833 Body mass index (BMI) 33.0-33.9, adult: Secondary | ICD-10-CM | POA: Diagnosis not present

## 2024-05-07 DIAGNOSIS — E1142 Type 2 diabetes mellitus with diabetic polyneuropathy: Secondary | ICD-10-CM | POA: Diagnosis not present

## 2024-05-07 DIAGNOSIS — E66811 Obesity, class 1: Secondary | ICD-10-CM | POA: Diagnosis not present

## 2024-05-07 DIAGNOSIS — I10 Essential (primary) hypertension: Secondary | ICD-10-CM | POA: Diagnosis not present

## 2024-05-07 DIAGNOSIS — E782 Mixed hyperlipidemia: Secondary | ICD-10-CM | POA: Diagnosis not present

## 2024-05-08 DIAGNOSIS — K219 Gastro-esophageal reflux disease without esophagitis: Secondary | ICD-10-CM | POA: Diagnosis not present

## 2024-05-08 DIAGNOSIS — R1311 Dysphagia, oral phase: Secondary | ICD-10-CM | POA: Diagnosis not present

## 2024-05-09 ENCOUNTER — Ambulatory Visit: Admitting: Physical Therapy

## 2024-05-09 DIAGNOSIS — M6281 Muscle weakness (generalized): Secondary | ICD-10-CM | POA: Diagnosis not present

## 2024-05-09 DIAGNOSIS — M5459 Other low back pain: Secondary | ICD-10-CM

## 2024-05-09 DIAGNOSIS — M5416 Radiculopathy, lumbar region: Secondary | ICD-10-CM | POA: Diagnosis not present

## 2024-05-09 DIAGNOSIS — R293 Abnormal posture: Secondary | ICD-10-CM | POA: Diagnosis not present

## 2024-05-09 NOTE — Therapy (Signed)
 OUTPATIENT PHYSICAL THERAPY THORACOLUMBAR TREATMENT   Patient Name: Amy Adams MRN: [id number] DOB:03-22-1966, 58 y.o., female Today's Date: 05/09/2024  END OF SESSION:  PT End of Session - 05/09/24 1557     Visit Number 5    Number of Visits 8    Date for PT Re-Evaluation 04/25/24    PT Start Time 0315    PT Stop Time 0414    PT Time Calculation (min) 59 min    Activity Tolerance Patient tolerated treatment well    Behavior During Therapy WFL for tasks assessed/performed           Past Medical History:  Diagnosis Date   Anxiety    Arthritis    Chronic foot pain, left    GERD (gastroesophageal reflux disease)    History of anal fissures    Hyperlipidemia    Hypertension    Hypothyroidism    Insulin  dependent type 2 diabetes mellitus (HCC)    endrocrinologist-- dr tommas   Migraines    Peripheral neuropathy    RA (rheumatoid arthritis) [facility])    rheumatologist-  dr mai   SUI (stress urinary incontinence, female)    Wears glasses    Past Surgical History:  Procedure Laterality Date   ABDOMINAL HYSTERECTOMY  1990   with Right Salpingo--ophorectomy   ANAL SPHINCTEROTOMY  01-05-2006    dr lorriane @[facility]    BUNIONECTOMY  09-25-2009   dr verta @[facility]    right great toe   CARPAL TUNNEL RELEASE Bilateral 1996;  1997   CESAREAN SECTION  1988   FOOT SURGERY  12/ 2014   dr verta   left 2nd hammertoe repair,  left 2nd metatarsal matrixectomy, left heel endoscopic plantar fasiotomy   IR CATHETER TUBE CHANGE  01/01/2019   IR RADIOLOGIST EVAL & MGMT  11/28/2018   IR RADIOLOGIST EVAL & MGMT  11/29/2018   IR RADIOLOGIST EVAL & MGMT  12/06/2018   IR RADIOLOGIST EVAL & MGMT  12/25/2018   IR RADIOLOGIST EVAL & MGMT  01/08/2019   IR RADIOLOGIST EVAL & MGMT  01/29/2019   PANNICULECTOMY N/A 10/26/2018   Procedure: PANNICULECTOMY;  Surgeon: Gladis Cough, MD;  Location: [facility];  Service: General;  Laterality: N/A;   TENDON REPAIR  09/2015   left posterior tibial and peroneal  tendon repairs   TOE AMPUTATION  01/2015   right 2nd toe   TONSILLECTOMY  age 10   TRANSOBTURATOR SLING  01-08-2004   dr watt @[facility]    Patient Active Problem List   Diagnosis Date Noted   Degeneration of lumbar intervertebral disc 03/10/2022   Generalized psoriasis 03/10/2022   Other long term (current) drug therapy 03/10/2022   Overweight 03/10/2022   Pain in limb 03/10/2022   Primary osteoarthritis 03/10/2022   Synovial cyst 03/10/2022   S/P panniculectomy 10/26/2018   Panniculitis 09/04/2018   Hypothyroidism 12/01/2010   Type 2 diabetes mellitus with diabetic polyneuropathy, with long-term current use of insulin  (HCC) 12/01/2010   DIAB W/NEURO MANIFESTS TYPE II/UNS NOT UNCNTRL 12/01/2010   OTHER AND UNSPECIFIED HYPERLIPIDEMIA 12/01/2010   OBESITY, UNSPECIFIED 12/01/2010   ESSENTIAL HYPERTENSION, BENIGN 12/01/2010   Rheumatoid arthritis (HCC) 12/01/2010    REFERRING PROVIDER: Deatrice Elspeth Manus MD  REFERRING DIAG: H/o of lumbosacral spine surgery  Rationale for Evaluation and Treatment: Rehabilitation  THERAPY DIAG:  Other low back pain  Abnormal posture  ONSET DATE: Chronic  SUBJECTIVE:  SUBJECTIVE STATEMENT: Pain high and impairing sleep.  PERTINENT HISTORY:  See above  Lumbar L4-L5 fusion, laminectomy Removal of cyst  PAIN:  Are you having pain? Yes: NPRS scale: 7-8/10. Pain location: LB/SIJ's.   Pain description: Throbbing, sharp and radiates. Aggravating factors: See above. Relieving factors: See above.    PRECAUTIONS: Fall.  Recommended patient use a cane for additional safety.  RED FLAGS: None   WEIGHT BEARING RESTRICTIONS: No  FALLS:  Has patient fallen in last 6 months? Yes. Number of falls 2.  Leg gave way.  LIVING ENVIRONMENT: Lives with: lives with their  family Lives in: House/apartment Has following equipment at home: None  PATIENT GOALS: Get out of pain and be able to perform ADL's like she did before the onset of pain.      OBJECTIVE:  Note: Objective measures were completed at Evaluation unless otherwise noted.  PATIENT SURVEYS:  Modified Oswestry 33/50.   POSTURE: decreased lumbar lordosis  PALPATION: Very tender to palpation over both SIJ's.  LUMBAR ROM:   Active lumbar flexion limited by 50% and patient's has to use her hands on her quads to assist herself back to an upright posture.  Extension actively is 15 degrees.   LOWER EXTREMITY MMT:    Patient able to provide a normal strength grade via manual muscle testing for bilateral knees and ankles.    LUMBAR SPECIAL TESTS:  Equal leg lengths.     GAIT: Antalgic gait.    TREATMENT DATE:   05/09/24:  US  at 1.5 w/cm2 x 6 mins each SIJ while seated (12 minutes total) f/b  STW to Bil SIJs and LB paras while seated x 12  minutes f/b  IFC at 80-150 Hz on 40% scan x 20 minutes while seated to patient's bilateral L-S region.  Normal modality response following removal of modality.  04/29/24:  US /E'stim  combo 1.5 w/cm2 x 6 mins each SIJ while seated (12 minutes total) f/b  STW to Bil SIJs and LB paras while seated x 11 minutes f/b  IFC at 80-150 Hz on 40% scan x 20 minutes while seated to patient's bilateral L-S region.  Normal modality response following removal of modality.  04/23/24:  US  combo 1.5 w/cm2 x 6 mins each SIJ while seated (12 minutes total) f/b  STW to Bil SIJs and LB paras while seated x 12 minutes f/b  IFC at 80-150 Hz on 40% scan x 15 minutes while seated to patient's bilateral L-S region.  Normal modality response following removal of modality.  04/16/24: US  combo 1.5 w/cm2 x 8 mins each SIJ while seated STW to Bil SIJs and LB paras while seated   HMP and IFC at 80-150 Hz on 40% scan x 20 minutes while seated to patient's bilateral L-S region.  Normal  modality response following removal of modality.  PATIENT EDUCATION:  Education details:  Person educated:  International aid/development worker:  Education comprehension:   HOME EXERCISE PROGRAM:   ASSESSMENT:  CLINICAL IMPRESSION: See discharge summary.  OBJECTIVE IMPAIRMENTS: Abnormal gait, decreased activity tolerance, decreased ROM, increased muscle spasms, postural dysfunction, and pain.   ACTIVITY LIMITATIONS: carrying, lifting, bending, and locomotion level  PARTICIPATION LIMITATIONS: meal prep, cleaning, laundry, community activity, and yard work  PERSONAL FACTORS: Time since onset of injury/illness/exacerbation and 1 comorbidity: lumbar fusion are also affecting patient's functional outcome.   REHAB POTENTIAL: Fair minus  CLINICAL DECISION MAKING: Evolving/moderate complexity  EVALUATION COMPLEXITY: Moderate   GOALS:  SHORT TERM GOALS: Target date: 04/25/24.  Ind with HEP. Goal status: Not met.  2.  Perform ADL's with pain not > 4-5/10.  Goal status: Not met 3.  Walk a community distance with pain not > 4-5/10.  Goal status: Not met.   PLAN:  PT FREQUENCY: 2x/week  PT DURATION: 4 weeks  PLANNED INTERVENTIONS: 97110-Therapeutic exercises, 97530- Therapeutic activity, [id number]- Neuromuscular re-education, 97535- Self Care, 02859- Manual therapy, G0283- Electrical stimulation (unattended), 97035- Ultrasound, Patient/Family education, Dry Needling, Cryotherapy, and Moist heat.  PLAN FOR NEXT SESSION:  Combo e'stim/US  at 1.50 W/CM2, STW/M, core exercise progression, spinal protection techniques and body mechanics training.  PHYSICAL THERAPY DISCHARGE SUMMARY  Visits from Start of Care: 5.    Current functional level related to goals / functional outcomes: Patient with continued high pain levels that impairs function and sleep and quality of life.      Remaining deficits: No goals met.   Education / Equipment: Reccommended to patient a local gym  for overall body exercise program that can be tailor made for her.  Patient agrees to discharge. Patient goals were not met. Patient is being discharged due to lack of progress.   Joron Velis, [place], PT 05/09/2024, 4:19 PM

## 2024-05-14 DIAGNOSIS — M0589 Other rheumatoid arthritis with rheumatoid factor of multiple sites: Secondary | ICD-10-CM | POA: Diagnosis not present

## 2024-05-21 ENCOUNTER — Other Ambulatory Visit: Payer: Self-pay | Admitting: Gastroenterology

## 2024-05-21 DIAGNOSIS — R1311 Dysphagia, oral phase: Secondary | ICD-10-CM

## 2024-05-27 ENCOUNTER — Encounter: Payer: Self-pay | Admitting: Registered Nurse

## 2024-05-27 ENCOUNTER — Encounter: Payer: 59 | Attending: Registered Nurse | Admitting: Registered Nurse

## 2024-05-27 VITALS — BP 141/81 | HR 91 | Ht 69.0 in | Wt 217.6 lb

## 2024-05-27 DIAGNOSIS — Z5181 Encounter for therapeutic drug level monitoring: Secondary | ICD-10-CM | POA: Insufficient documentation

## 2024-05-27 DIAGNOSIS — G5793 Unspecified mononeuropathy of bilateral lower limbs: Secondary | ICD-10-CM | POA: Insufficient documentation

## 2024-05-27 DIAGNOSIS — G8929 Other chronic pain: Secondary | ICD-10-CM | POA: Insufficient documentation

## 2024-05-27 DIAGNOSIS — G629 Polyneuropathy, unspecified: Secondary | ICD-10-CM | POA: Insufficient documentation

## 2024-05-27 DIAGNOSIS — M25512 Pain in left shoulder: Secondary | ICD-10-CM | POA: Diagnosis not present

## 2024-05-27 DIAGNOSIS — R296 Repeated falls: Secondary | ICD-10-CM | POA: Diagnosis not present

## 2024-05-27 DIAGNOSIS — G894 Chronic pain syndrome: Secondary | ICD-10-CM | POA: Diagnosis not present

## 2024-05-27 DIAGNOSIS — M545 Low back pain, unspecified: Secondary | ICD-10-CM | POA: Insufficient documentation

## 2024-05-27 DIAGNOSIS — Z79899 Other long term (current) drug therapy: Secondary | ICD-10-CM | POA: Diagnosis not present

## 2024-05-27 DIAGNOSIS — M25511 Pain in right shoulder: Secondary | ICD-10-CM | POA: Diagnosis not present

## 2024-05-27 MED ORDER — TRAMADOL HCL 50 MG PO TABS: 50.0000 mg | ORAL_TABLET | Freq: Three times a day (TID) | ORAL | 1 refills | Status: DC | PRN

## 2024-05-27 NOTE — Patient Instructions (Signed)
 Call about Free Balance Screening at [facility] [phone]  OI ordered you a cane   Call Your Primary Care regarding your Sleep Habits / Keep Journal  See about Sleep Study   Call Insurance regarding Counseling.   Call or send My- Chart with a update on the above.

## 2024-05-27 NOTE — Progress Notes (Signed)
 Subjective:    Patient ID: Amy Adams, female    DOB: 08-11-1966, 58 y.o.   MRN: [id number]  HPI: Amy Adams is a 58 y.o. female who returns for follow up appointment for chronic pain and medication refill. She states her pain is located in  her bilateral hands and bilateral feet with tingling and burning, lower back and generalized joint pain. She rates her pain 7. Her current exercise regime is walking and performing stretching exercises.  [name] reports frequent falls, she reports right lower extremity gave out, she had followed up with Dr Joshua she reports. Educated on falls prevention, she verbalizes understanding. She will call  for Free Balance Screening, she verbalizes understanding.   [name] Morphine  equivalent is 30.00 MME.   Last UDS was Performed on 12/07/2023, it was consistent.      Pain Inventory Average Pain 7 Pain Right Now 7 My pain is constant, sharp, burning, tingling, and aching  In the last 24 hours, has pain interfered with the following? General activity 8 Relation with others 8 Enjoyment of life 8 What TIME of day is your pain at its worst? morning , daytime, evening, and night Sleep (in general) Poor  Pain is worse with: walking, bending, sitting, and standing Pain improves with: rest and medication Relief from Meds: 5  Family History  Problem Relation Age of Onset   Diabetes Mother    Migraines Mother    Arrhythmia Father    COPD Father    Cancer Father    Diabetes Sister    Social History   Socioeconomic History   Marital status: Widowed    Spouse name: Not on file   Number of children: 2   Years of education: Not on file   Highest education level: Not on file  Occupational History   Not on file  Tobacco Use   Smoking status: Never   Smokeless tobacco: Never  Vaping Use   Vaping status: Never Used  Substance and Sexual Activity   Alcohol  use: No   Drug use: Never   Sexual activity: Not on file  Other Topics  Concern   Not on file  Social History Narrative   [place]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   Are you right handed or left handed? Right   Are you currently employed ? [employer]   What is your current occupation?   Do you live at home alone? yes   Caffeine 2 weekly soda   What type of home do you live in: 1 story or 2 story? one       Social Drivers of Corporate investment banker Strain: Not on file  Food Insecurity: Not on file  Transportation Needs: Not on file  Physical Activity: Not on file  Stress: Not on file  Social Connections: Not on file   Past Surgical History:  Procedure Laterality Date   ABDOMINAL HYSTERECTOMY  1990   with Right Salpingo--ophorectomy   ANAL SPHINCTEROTOMY  01-05-2006    dr lorriane @[facility]    BUNIONECTOMY  09-25-2009   dr verta @[facility]    right great toe   CARPAL TUNNEL RELEASE Bilateral 1996;  1997   CESAREAN SECTION  1988   FOOT SURGERY  12/ 2014   dr verta   left 2nd hammertoe repair,  left 2nd metatarsal matrixectomy, left heel endoscopic plantar fasiotomy   IR CATHETER TUBE CHANGE  01/01/2019   IR RADIOLOGIST EVAL & MGMT  11/28/2018   IR RADIOLOGIST EVAL & MGMT  11/29/2018   IR RADIOLOGIST EVAL & MGMT  12/06/2018   IR RADIOLOGIST EVAL & MGMT  12/25/2018   IR RADIOLOGIST EVAL & MGMT  01/08/2019   IR RADIOLOGIST EVAL & MGMT  01/29/2019   PANNICULECTOMY N/A 10/26/2018   Procedure: PANNICULECTOMY;  Surgeon: Gladis Cough, MD;  Location: [facility];  Service: General;  Laterality: N/A;   TENDON REPAIR  09/2015   left posterior tibial and peroneal tendon repairs   TOE AMPUTATION  01/2015   right 2nd toe   TONSILLECTOMY  age 63   TRANSOBTURATOR SLING  01-08-2004   dr watt @[facility]    Past Surgical History:  Procedure Laterality Date   ABDOMINAL HYSTERECTOMY  1990   with Right Salpingo--ophorectomy   ANAL SPHINCTEROTOMY  01-05-2006    dr lorriane @[facility]    BUNIONECTOMY  09-25-2009   dr verta @[facility]    right great toe   CARPAL TUNNEL RELEASE Bilateral 1996;  1997    CESAREAN SECTION  1988   FOOT SURGERY  12/ 2014   dr verta   left 2nd hammertoe repair,  left 2nd metatarsal matrixectomy, left heel endoscopic plantar fasiotomy   IR CATHETER TUBE CHANGE  01/01/2019   IR RADIOLOGIST EVAL & MGMT  11/28/2018   IR RADIOLOGIST EVAL & MGMT  11/29/2018   IR RADIOLOGIST EVAL & MGMT  12/06/2018   IR RADIOLOGIST EVAL & MGMT  12/25/2018   IR RADIOLOGIST EVAL & MGMT  01/08/2019   IR RADIOLOGIST EVAL & MGMT  01/29/2019   PANNICULECTOMY N/A 10/26/2018   Procedure: PANNICULECTOMY;  Surgeon: Gladis Cough, MD;  Location: [facility];  Service: General;  Laterality: N/A;   TENDON REPAIR  09/2015   left posterior tibial and peroneal tendon repairs   TOE AMPUTATION  01/2015   right 2nd toe   TONSILLECTOMY  age 69   TRANSOBTURATOR SLING  01-08-2004   dr watt @[facility]    Past Medical History:  Diagnosis Date   Anxiety    Arthritis    Chronic foot pain, left    GERD (gastroesophageal reflux disease)    History of anal fissures    Hyperlipidemia    Hypertension    Hypothyroidism    Insulin  dependent type 2 diabetes mellitus (HCC)    endrocrinologist-- dr tommas   Migraines    Peripheral neuropathy    RA (rheumatoid arthritis) [facility])    rheumatologist-  dr mai   SUI (stress urinary incontinence, female)    Wears glasses    BP (!) 141/81   Pulse 91   Ht 5' 9 (1.753 m)   Wt 217 lb 9.6 oz (98.7 kg)   SpO2 96%   BMI 32.13 kg/m   Opioid Risk Score:   Fall Risk Score:  `1  Depression screen PHQ 2/9     05/27/2024    1:10 PM 06/14/2023    1:31 PM 02/21/2023    2:16 PM 03/11/2022    1:04 PM 09/02/2021    9:14 AM 08/12/2021    2:31 PM 12/23/2019    8:13 AM  Depression screen PHQ 2/9  Decreased Interest 0 3 3 0 0 0 0  Down, Depressed, Hopeless 0 1 3 0 0 1 0  PHQ - 2 Score 0 4 6 0 0 1 0  Altered sleeping      3   Tired, decreased energy      1   Change in appetite      3   Feeling bad or failure about yourself  0   Trouble concentrating      0   Moving slowly  or fidgety/restless      0   Suicidal thoughts      0   PHQ-9 Score      8   Difficult doing work/chores      Not difficult at all     Review of Systems  Musculoskeletal:  Positive for arthralgias, back pain and joint swelling.       Right shoulder and bil elbows and wrist (RA)       Objective:   Physical Exam Vitals and nursing note reviewed.  Constitutional:      Appearance: Normal appearance.  Cardiovascular:     Rate and Rhythm: Normal rate and regular rhythm.     Pulses: Normal pulses.     Heart sounds: Normal heart sounds.  Pulmonary:     Effort: Pulmonary effort is normal.     Breath sounds: Normal breath sounds.  Musculoskeletal:     Comments: Normal Muscle Bulk and Muscle Testing Reveals:  Upper Extremities: Decreased ROM 90 Degrees and Muscle Strength 5/5 Right AC Joint Tenderness  Lumbar Hypersensitivity Lower Extremities : Decreased ROM and Muscle Strength 5/5 Bilateral Lower Extremities Flexion Produces Pain into her Lumbar Arises from Table slowly Narrow Based Gait     Skin:    General: Skin is warm and dry.  Neurological:     Mental Status: She is alert and oriented to person, place, and time.  Psychiatric:        Mood and Affect: Mood normal.        Behavior: Behavior normal.          Assessment & Plan:  Right Lumbar Radiculitis/ Chronic Bilateral Low Back Pain without Sciatica: S/P   surgery on 11/11/2022: L4-5 posterior lumbar interbody fusion by Dr Joshua.Continue HEP as Tolerated. . Continue current medication regimen. Continue to monitor. 05/27/2024 Thoracic Back Pain: Continue HEP as tolerated. Continue to Monitor. 05/27/2024 Painful Diabetic Neuropathy:  Continue Gabapentin .05/27/2024 3. Right Shoulder Pain: Continue HEP as Tolerated. Continue to Monitor. 05/27/2024 4. Chronic Pain Syndrome: Refilled Tramadol  50 mg  one tablet three times a  day as needed for pain # 270. We will continue the opioid monitoring program, this consists of regular  clinic visits, examinations, urine drug screen, pill counts as well as use of [place]  Controlled Substance Reporting system. A 12 month History has been reviewed on the [place]  Controlled Substance Reporting System on 05/27/2024 5. Fall at Home: Educated on [employer], she verbalizes understanding. She will call for Free Balance Screening Test, she verbalizes understanding.    F/U in 6 months

## 2024-05-28 DIAGNOSIS — M0589 Other rheumatoid arthritis with rheumatoid factor of multiple sites: Secondary | ICD-10-CM | POA: Diagnosis not present

## 2024-05-30 ENCOUNTER — Other Ambulatory Visit: Payer: Self-pay | Admitting: Gastroenterology

## 2024-05-30 ENCOUNTER — Ambulatory Visit
Admission: RE | Admit: 2024-05-30 | Discharge: 2024-05-30 | Disposition: A | Discharge status: Home / Self Care | Source: Ambulatory Visit | Attending: Gastroenterology | Admitting: Gastroenterology

## 2024-05-30 DIAGNOSIS — K449 Diaphragmatic hernia without obstruction or gangrene: Secondary | ICD-10-CM | POA: Diagnosis not present

## 2024-05-30 DIAGNOSIS — K224 Dyskinesia of esophagus: Secondary | ICD-10-CM | POA: Diagnosis not present

## 2024-05-30 DIAGNOSIS — Z79899 Other long term (current) drug therapy: Secondary | ICD-10-CM | POA: Diagnosis not present

## 2024-05-30 DIAGNOSIS — Z6832 Body mass index (BMI) 32.0-32.9, adult: Secondary | ICD-10-CM | POA: Diagnosis not present

## 2024-05-30 DIAGNOSIS — M79644 Pain in right finger(s): Secondary | ICD-10-CM | POA: Diagnosis not present

## 2024-05-30 DIAGNOSIS — R1311 Dysphagia, oral phase: Secondary | ICD-10-CM

## 2024-05-30 DIAGNOSIS — E782 Mixed hyperlipidemia: Secondary | ICD-10-CM | POA: Diagnosis not present

## 2024-05-30 DIAGNOSIS — L401 Generalized pustular psoriasis: Secondary | ICD-10-CM | POA: Diagnosis not present

## 2024-05-30 DIAGNOSIS — E039 Hypothyroidism, unspecified: Secondary | ICD-10-CM | POA: Diagnosis not present

## 2024-05-30 DIAGNOSIS — N181 Chronic kidney disease, stage 1: Secondary | ICD-10-CM | POA: Diagnosis not present

## 2024-05-30 DIAGNOSIS — R5383 Other fatigue: Secondary | ICD-10-CM | POA: Diagnosis not present

## 2024-05-30 DIAGNOSIS — M0589 Other rheumatoid arthritis with rheumatoid factor of multiple sites: Secondary | ICD-10-CM | POA: Diagnosis not present

## 2024-05-30 DIAGNOSIS — M5136 Other intervertebral disc degeneration, lumbar region with discogenic back pain only: Secondary | ICD-10-CM | POA: Diagnosis not present

## 2024-05-30 DIAGNOSIS — M1991 Primary osteoarthritis, unspecified site: Secondary | ICD-10-CM | POA: Diagnosis not present

## 2024-05-30 DIAGNOSIS — E669 Obesity, unspecified: Secondary | ICD-10-CM | POA: Diagnosis not present

## 2024-06-04 DIAGNOSIS — Z6832 Body mass index (BMI) 32.0-32.9, adult: Secondary | ICD-10-CM | POA: Diagnosis not present

## 2024-06-04 DIAGNOSIS — E782 Mixed hyperlipidemia: Secondary | ICD-10-CM | POA: Diagnosis not present

## 2024-06-04 DIAGNOSIS — F4321 Adjustment disorder with depressed mood: Secondary | ICD-10-CM | POA: Diagnosis not present

## 2024-06-04 DIAGNOSIS — E1142 Type 2 diabetes mellitus with diabetic polyneuropathy: Secondary | ICD-10-CM | POA: Diagnosis not present

## 2024-06-04 DIAGNOSIS — E66811 Obesity, class 1: Secondary | ICD-10-CM | POA: Diagnosis not present

## 2024-06-04 DIAGNOSIS — I10 Essential (primary) hypertension: Secondary | ICD-10-CM | POA: Diagnosis not present

## 2024-06-25 DIAGNOSIS — Z79899 Other long term (current) drug therapy: Secondary | ICD-10-CM | POA: Diagnosis not present

## 2024-06-25 DIAGNOSIS — M0589 Other rheumatoid arthritis with rheumatoid factor of multiple sites: Secondary | ICD-10-CM | POA: Diagnosis not present

## 2024-07-01 DIAGNOSIS — M0589 Other rheumatoid arthritis with rheumatoid factor of multiple sites: Secondary | ICD-10-CM | POA: Diagnosis not present

## 2024-07-03 DIAGNOSIS — E1142 Type 2 diabetes mellitus with diabetic polyneuropathy: Secondary | ICD-10-CM | POA: Diagnosis not present

## 2024-07-03 DIAGNOSIS — E782 Mixed hyperlipidemia: Secondary | ICD-10-CM | POA: Diagnosis not present

## 2024-07-03 DIAGNOSIS — E66811 Obesity, class 1: Secondary | ICD-10-CM | POA: Diagnosis not present

## 2024-07-03 DIAGNOSIS — Z6831 Body mass index (BMI) 31.0-31.9, adult: Secondary | ICD-10-CM | POA: Diagnosis not present

## 2024-07-03 DIAGNOSIS — I10 Essential (primary) hypertension: Secondary | ICD-10-CM | POA: Diagnosis not present

## 2024-07-10 DIAGNOSIS — M5417 Radiculopathy, lumbosacral region: Secondary | ICD-10-CM | POA: Diagnosis not present

## 2024-07-10 DIAGNOSIS — M961 Postlaminectomy syndrome, not elsewhere classified: Secondary | ICD-10-CM | POA: Diagnosis not present

## 2024-07-23 DIAGNOSIS — M0589 Other rheumatoid arthritis with rheumatoid factor of multiple sites: Secondary | ICD-10-CM | POA: Diagnosis not present

## 2024-07-23 DIAGNOSIS — Z79899 Other long term (current) drug therapy: Secondary | ICD-10-CM | POA: Diagnosis not present

## 2024-07-30 DIAGNOSIS — M5417 Radiculopathy, lumbosacral region: Secondary | ICD-10-CM | POA: Diagnosis not present

## 2024-08-08 DIAGNOSIS — E66811 Obesity, class 1: Secondary | ICD-10-CM | POA: Diagnosis not present

## 2024-08-08 DIAGNOSIS — Z6832 Body mass index (BMI) 32.0-32.9, adult: Secondary | ICD-10-CM | POA: Diagnosis not present

## 2024-08-08 DIAGNOSIS — I10 Essential (primary) hypertension: Secondary | ICD-10-CM | POA: Diagnosis not present

## 2024-08-08 DIAGNOSIS — E782 Mixed hyperlipidemia: Secondary | ICD-10-CM | POA: Diagnosis not present

## 2024-08-08 DIAGNOSIS — N1831 Chronic kidney disease, stage 3a: Secondary | ICD-10-CM | POA: Diagnosis not present

## 2024-08-08 DIAGNOSIS — E1142 Type 2 diabetes mellitus with diabetic polyneuropathy: Secondary | ICD-10-CM | POA: Diagnosis not present

## 2024-08-09 DIAGNOSIS — K219 Gastro-esophageal reflux disease without esophagitis: Secondary | ICD-10-CM | POA: Diagnosis not present

## 2024-08-09 DIAGNOSIS — M961 Postlaminectomy syndrome, not elsewhere classified: Secondary | ICD-10-CM | POA: Diagnosis not present

## 2024-08-09 DIAGNOSIS — E782 Mixed hyperlipidemia: Secondary | ICD-10-CM | POA: Diagnosis not present

## 2024-08-09 DIAGNOSIS — G4700 Insomnia, unspecified: Secondary | ICD-10-CM | POA: Diagnosis not present

## 2024-08-09 DIAGNOSIS — E1142 Type 2 diabetes mellitus with diabetic polyneuropathy: Secondary | ICD-10-CM | POA: Diagnosis not present

## 2024-08-09 DIAGNOSIS — M545 Low back pain, unspecified: Secondary | ICD-10-CM | POA: Diagnosis not present

## 2024-08-09 DIAGNOSIS — E039 Hypothyroidism, unspecified: Secondary | ICD-10-CM | POA: Diagnosis not present

## 2024-08-09 DIAGNOSIS — Z23 Encounter for immunization: Secondary | ICD-10-CM | POA: Diagnosis not present

## 2024-08-09 DIAGNOSIS — G8929 Other chronic pain: Secondary | ICD-10-CM | POA: Diagnosis not present

## 2024-08-09 DIAGNOSIS — M069 Rheumatoid arthritis, unspecified: Secondary | ICD-10-CM | POA: Diagnosis not present

## 2024-08-09 DIAGNOSIS — F419 Anxiety disorder, unspecified: Secondary | ICD-10-CM | POA: Diagnosis not present

## 2024-08-15 ENCOUNTER — Ambulatory Visit: Admitting: Podiatry

## 2024-08-15 DIAGNOSIS — I10 Essential (primary) hypertension: Secondary | ICD-10-CM | POA: Diagnosis not present

## 2024-08-15 DIAGNOSIS — M722 Plantar fascial fibromatosis: Secondary | ICD-10-CM

## 2024-08-15 DIAGNOSIS — E1142 Type 2 diabetes mellitus with diabetic polyneuropathy: Secondary | ICD-10-CM | POA: Diagnosis not present

## 2024-08-16 MED ORDER — TRIAMCINOLONE ACETONIDE 0.1 % EX CREA
1.0000 | TOPICAL_CREAM | Freq: Two times a day (BID) | CUTANEOUS | 0 refills | Status: DC
Start: 2024-08-16 — End: 2024-08-16

## 2024-08-16 NOTE — Progress Notes (Signed)
 She presents today for follow-up of her bilateral foot.  States that she has injured her back when out of work actually lost her job with [employer] due to being unable to perform her duties.  She was out of work after having lower back surgery by Dr. Joshua.  Current hemoglobin A1c is 7.5 states that her blood sugar is starting to hit out of control she states that she is depressed she is upset has had a loss of family members as well.  She is complaining of pain to the bilateral heels and the lateral margins of the foot.  Objective: Vital signs are stable alert and oriented x 3 pulses are palpable bilateral feet do demonstrate significant neuropathic changes no open lesions or wounds.  Decree sensorium per [facility] monofilament.  Deep tendon reflexes are intact.  No rashes.  She has pain on palpation medial calcaneal tubercles bilaterally and pain on palpation and frontal plane range of motion of the fourth tarsometatarsal joint bilaterally.  Assessment: Diabetic peripheral neuropathy history of amputation second digit left foot plan fasciitis bilateral.  Plan: Discussed etiology pathology conservative surgical therapies at this point I injected the bilateral heels today 10 mg Kenalog  5 mg Marcaine for maximal tenderness.  Encouraged her to continue to try to control diabetes as tightly as possible to help prevent the progression of her diabetic peripheral neuropathy and eventual loss of toes.

## 2024-08-20 DIAGNOSIS — M0589 Other rheumatoid arthritis with rheumatoid factor of multiple sites: Secondary | ICD-10-CM | POA: Diagnosis not present

## 2024-08-30 DIAGNOSIS — M79644 Pain in right finger(s): Secondary | ICD-10-CM | POA: Diagnosis not present

## 2024-08-30 DIAGNOSIS — E039 Hypothyroidism, unspecified: Secondary | ICD-10-CM | POA: Diagnosis not present

## 2024-08-30 DIAGNOSIS — M1991 Primary osteoarthritis, unspecified site: Secondary | ICD-10-CM | POA: Diagnosis not present

## 2024-08-30 DIAGNOSIS — E782 Mixed hyperlipidemia: Secondary | ICD-10-CM | POA: Diagnosis not present

## 2024-08-30 DIAGNOSIS — Z6831 Body mass index (BMI) 31.0-31.9, adult: Secondary | ICD-10-CM | POA: Diagnosis not present

## 2024-08-30 DIAGNOSIS — M0589 Other rheumatoid arthritis with rheumatoid factor of multiple sites: Secondary | ICD-10-CM | POA: Diagnosis not present

## 2024-08-30 DIAGNOSIS — N181 Chronic kidney disease, stage 1: Secondary | ICD-10-CM | POA: Diagnosis not present

## 2024-08-30 DIAGNOSIS — L401 Generalized pustular psoriasis: Secondary | ICD-10-CM | POA: Diagnosis not present

## 2024-08-30 DIAGNOSIS — Z79899 Other long term (current) drug therapy: Secondary | ICD-10-CM | POA: Diagnosis not present

## 2024-08-30 DIAGNOSIS — R5383 Other fatigue: Secondary | ICD-10-CM | POA: Diagnosis not present

## 2024-08-30 DIAGNOSIS — E669 Obesity, unspecified: Secondary | ICD-10-CM | POA: Diagnosis not present

## 2024-08-30 DIAGNOSIS — M5136 Other intervertebral disc degeneration, lumbar region with discogenic back pain only: Secondary | ICD-10-CM | POA: Diagnosis not present

## 2024-09-04 DIAGNOSIS — R32 Unspecified urinary incontinence: Secondary | ICD-10-CM | POA: Diagnosis not present

## 2024-09-04 DIAGNOSIS — M461 Sacroiliitis, not elsewhere classified: Secondary | ICD-10-CM | POA: Diagnosis not present

## 2024-09-04 DIAGNOSIS — M5417 Radiculopathy, lumbosacral region: Secondary | ICD-10-CM | POA: Diagnosis not present

## 2024-09-04 DIAGNOSIS — Z6831 Body mass index (BMI) 31.0-31.9, adult: Secondary | ICD-10-CM | POA: Diagnosis not present

## 2024-09-10 DIAGNOSIS — M069 Rheumatoid arthritis, unspecified: Secondary | ICD-10-CM | POA: Diagnosis not present

## 2024-09-10 DIAGNOSIS — Z79899 Other long term (current) drug therapy: Secondary | ICD-10-CM | POA: Diagnosis not present

## 2024-09-10 DIAGNOSIS — H40033 Anatomical narrow angle, bilateral: Secondary | ICD-10-CM | POA: Diagnosis not present

## 2024-09-17 DIAGNOSIS — M0589 Other rheumatoid arthritis with rheumatoid factor of multiple sites: Secondary | ICD-10-CM | POA: Diagnosis not present

## 2024-09-17 DIAGNOSIS — R32 Unspecified urinary incontinence: Secondary | ICD-10-CM | POA: Diagnosis not present

## 2024-09-17 DIAGNOSIS — I10 Essential (primary) hypertension: Secondary | ICD-10-CM | POA: Diagnosis not present

## 2024-09-17 DIAGNOSIS — G629 Polyneuropathy, unspecified: Secondary | ICD-10-CM | POA: Diagnosis not present

## 2024-09-17 DIAGNOSIS — N1831 Chronic kidney disease, stage 3a: Secondary | ICD-10-CM | POA: Diagnosis not present

## 2024-09-17 DIAGNOSIS — R159 Full incontinence of feces: Secondary | ICD-10-CM | POA: Diagnosis not present

## 2024-09-17 DIAGNOSIS — M549 Dorsalgia, unspecified: Secondary | ICD-10-CM | POA: Diagnosis not present

## 2024-09-19 DIAGNOSIS — Z6832 Body mass index (BMI) 32.0-32.9, adult: Secondary | ICD-10-CM | POA: Diagnosis not present

## 2024-09-19 DIAGNOSIS — N1831 Chronic kidney disease, stage 3a: Secondary | ICD-10-CM | POA: Diagnosis not present

## 2024-09-19 DIAGNOSIS — I10 Essential (primary) hypertension: Secondary | ICD-10-CM | POA: Diagnosis not present

## 2024-09-19 DIAGNOSIS — E782 Mixed hyperlipidemia: Secondary | ICD-10-CM | POA: Diagnosis not present

## 2024-09-19 DIAGNOSIS — F5104 Psychophysiologic insomnia: Secondary | ICD-10-CM | POA: Diagnosis not present

## 2024-09-19 DIAGNOSIS — G4730 Sleep apnea, unspecified: Secondary | ICD-10-CM | POA: Diagnosis not present

## 2024-09-19 DIAGNOSIS — E1142 Type 2 diabetes mellitus with diabetic polyneuropathy: Secondary | ICD-10-CM | POA: Diagnosis not present

## 2024-09-19 DIAGNOSIS — E66811 Obesity, class 1: Secondary | ICD-10-CM | POA: Diagnosis not present

## 2024-09-23 ENCOUNTER — Ambulatory Visit: Admitting: Neurology

## 2024-09-24 DIAGNOSIS — H40033 Anatomical narrow angle, bilateral: Secondary | ICD-10-CM | POA: Diagnosis not present

## 2024-09-26 ENCOUNTER — Telehealth: Payer: Self-pay | Admitting: Podiatry

## 2024-09-26 NOTE — Telephone Encounter (Signed)
 Faxed office notes to [facility] [phone]

## 2024-10-02 DIAGNOSIS — H5710 Ocular pain, unspecified eye: Secondary | ICD-10-CM | POA: Diagnosis not present

## 2024-10-02 DIAGNOSIS — I10 Essential (primary) hypertension: Secondary | ICD-10-CM | POA: Diagnosis not present

## 2024-10-02 DIAGNOSIS — M545 Low back pain, unspecified: Secondary | ICD-10-CM | POA: Diagnosis not present

## 2024-10-08 DIAGNOSIS — H40031 Anatomical narrow angle, right eye: Secondary | ICD-10-CM | POA: Diagnosis not present

## 2024-10-08 DIAGNOSIS — H40033 Anatomical narrow angle, bilateral: Secondary | ICD-10-CM | POA: Diagnosis not present

## 2024-10-14 ENCOUNTER — Ambulatory Visit: Admitting: Neurology

## 2024-10-14 ENCOUNTER — Encounter: Payer: Self-pay | Admitting: Neurology

## 2024-10-14 DIAGNOSIS — M79605 Pain in left leg: Secondary | ICD-10-CM | POA: Diagnosis not present

## 2024-10-14 DIAGNOSIS — M5416 Radiculopathy, lumbar region: Secondary | ICD-10-CM

## 2024-10-14 NOTE — Patient Instructions (Signed)
 Nerve testing of left > right leg

## 2024-10-14 NOTE — Progress Notes (Signed)
 Follow-up Visit   Date: 10/14/2024    Amy Adams MRN: [id number] DOB: October 20, 1966    [name] is a 58 y.o. right-handed Caucasian female with diabetes mellitus, hypothroidism, RA, GERD, HTN, HPL, s/p left L4-5 laminectomy and synovial cyst resection, and chronic pain returning to the clinic for follow-up of left foot paresthesias and pain.  The patient was accompanied to the clinic by self.  IMPRESSION/PLAN: Chronic pain involving the low back and left lower extremity with radicular symptoms and paresthesias s/p L4-5 PLIF.  She has been diagnosed with failed back syndrome and is seeing pain management, but unfortunately, not getting any relief with injections, PT, or pain medication. MRI lumbar spine from 07/2024 does not show any structural pathology to explain her pain.    NCS/EMG of the left > right leg will be ordered to better characterize the nature of her symptoms.   Prior NCS/EMG from January 2023 shows mild left L5 radiculopathy, which was stable as compared to prior study on 09/2022.   Continue supportive care with pain management.   -------------------------------------------- UPDATE 10/10/2022:  She contact the office last week because of worsening left foot pain following her EMG. Pain is not over any of the needle insertion sites, but moreso the same location has it has always involved.  Her numbness is extended into the lower leg past the ankle.  She has pain over the dorsum of the foot.  She also has cramps in the toes and feet.    UPDATE 10/14/2024:  Discussed the use of AI scribe software for clinical note transcription with the patient, who gave verbal consent to proceed.  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  She experiences persistent and worsening back pain with associated neurological symptoms, including leg spasms, numbness from the knee to  the toes on the left, and leg jerking and shaking, particularly when lying down. She also has cramps in her legs.  She also has a history of back surgery in December 2023; she reports that after the surgery, her pain intensified.  Since the surgery, she reports that she has lost muscle in her legs and is unable to perform physical activities due to pain.  NCS/EMG from January 2025 shows mild left L5 radiculopathy, stable compared to study from 09/2022.  Most recent MRI lumbar spine shows stable L4-L5 fusion with no new structural disease. Her chronic pain significantly impacts her daily activities.  She has undergone three rounds of physical therapy, back injections, and dry needling, but reports no significant relief. She reports that her pain management doctor prescribed tramadol , and she is reluctant to use opioids.  She also has a history of rheumatoid arthritis, which has worsened, requiring IV infusions. 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  She experiences frequent falls and reports bladder and bowel dysfunction, which she attributes to nerve issues around her spinal cord. Although, there is no structural pathology on imaging.   Medications:  Current Outpatient Medications on File Prior to Visit  Medication Sig Dispense Refill   ALPRAZolam  (XANAX ) 1 MG tablet Take 1 tablet (1 mg total) by mouth 3 (three) times daily as needed. 270 tablet 1   ALPRAZolam  (XANAX ) 1 MG tablet Take 1 tablet (1 mg total) by mouth 3 (three) times daily as needed. 270 tablet 0  ALPRAZolam  (XANAX ) 1 MG tablet Take 1 tablet (1 mg total) by mouth 3 (three) times daily as needed. 270 tablet 1   Blood Pressure Monitoring (OMRON 3 SERIES BP MONITOR) DEVI Use as directed 1 each 0   chlorthalidone  (HYGROTON ) 25 MG tablet Take 1 tablet (25 mg total) by mouth daily with breakfast for blood pressure. 90 tablet 3   Continuous Glucose Sensor  (DEXCOM G7 SENSOR) MISC use as directed - every 10 days 9 each 4   Continuous Glucose Sensor (DEXCOM G7 SENSOR) MISC change every 10 days 3 each 11   cyclobenzaprine  (FLEXERIL ) 10 MG tablet Take 1 tablet (10 mg total) by mouth 3 (three) times daily as needed for muscle spasms. 270 tablet 0   DULoxetine  (CYMBALTA ) 60 MG capsule Take 1 capsule (60 mg total) by mouth 2 (two) times daily. 180 capsule 3   fenofibrate  micronized (LOFIBRA) 134 MG capsule Take 1 capsule (134 mg total) by mouth daily. 90 capsule 3   fluconazole  (DIFLUCAN ) 150 MG tablet Take 1 tablet (150 mg total) by mouth daily. May repeat for 1 time (Patient taking differently: Take 150 mg by mouth as needed.) 2 tablet 0   folic acid  (FOLVITE ) 1 MG tablet Take 2 tablets (2 mg total) by mouth daily. 180 tablet 3   gabapentin  (NEURONTIN ) 400 MG capsule Take 1 capsule (400 mg total) by mouth 3 (three) times daily. 270 capsule 4   hydrochlorothiazide  (HYDRODIURIL ) 25 MG tablet Take 1 tablet (25 mg total) by mouth daily. (Patient taking differently: Take 25 mg by mouth daily. Every othe day) 45 tablet 4   hydroxychloroquine  (PLAQUENIL ) 200 MG tablet Take 2 tablets (400 mg total) by mouth once daily with food or milk. 180 tablet 0   insulin  lispro (HUMALOG  KWIKPEN) 100 UNIT/ML KwikPen Inject 10-15 Units into the skin 3 (three) times daily. 45 mL 5   insulin  lispro (HUMALOG  KWIKPEN) 100 UNIT/ML KwikPen Inject 10-15 Units into the skin 3 (three) times daily. 45 mL 5   insulin  lispro (HUMALOG  KWIKPEN) 100 UNIT/ML KwikPen Inject 10-15 Units into the skin 3 (three) times daily for 30 days. 16 mL 5   insulin  lispro (HUMALOG ) 100 UNIT/ML injection Inject 6-15 Units into the skin 3 (three) times daily with meals. 6-15 units sliding scale AS NEEDED     levocetirizine (XYZAL ) 5 MG tablet Take 1 tablet (5 mg total) by mouth every evening. 90 tablet 3   levothyroxine  (SYNTHROID ) 50 MCG tablet Take 1 tablet (50 mcg total) by mouth in the morning on an empty  stomach 90 tablet 4   lidocaine  (LIDODERM ) 5 % Place 1 patch onto the skin once daily, remove after 12 (twelve) hours. 60 patch 3   metFORMIN  (GLUCOPHAGE -XR) 500 MG 24 hr tablet Take 2 tablets (1,000 mg total) by mouth 2 (two) times daily. 360 tablet 4   metFORMIN  (GLUCOPHAGE -XR) 500 MG 24 hr tablet Take 2 tablets (1,000 mg total) by mouth 2 (two) times daily. 120 tablet 5   montelukast  (SINGULAIR ) 10 MG tablet Take 1 tablet (10 mg total) by mouth every evening. 90 tablet 3   montelukast  (SINGULAIR ) 10 MG tablet Take 1 tablet (10 mg total) by mouth every evening. 90 tablet 3   omeprazole  (PRILOSEC) 40 MG capsule Take 1 capsule (40 mg total) by mouth 2 (two) times daily. 180 capsule 3   tirzepatide  (MOUNJARO ) 12.5 MG/0.5ML Pen Inject 12.5 mg into the skin once a week. 6 mL 4   traMADol  (ULTRAM ) 50 MG tablet  Take 1 tablet (50 mg total) by mouth 3 (three) times daily as needed. 270 tablet 1   TRESIBA FLEXTOUCH 100 UNIT/ML FlexTouch Pen Inject into the skin daily.     TUBERCULIN SYR 1CC/27GX1/2 27G X 1/2 1 ML MISC Inject into the skin once a week with methotrexate  13 each 3   UNIFINE PENTIPS 31G X 5 MM MISC USE AS DIRECTED 5 TIMES PER DAY  4   valACYclovir  (VALTREX ) 1000 MG tablet Take 2 tablets (2,000 mg total) by mouth 2 (two) times daily for 1 day when needed. 12 tablet 3   valsartan  (DIOVAN ) 320 MG tablet Take 1 tablet (320 mg total) by mouth daily. 90 tablet 3   zolpidem  (AMBIEN  CR) 12.5 MG CR tablet Take 1 tablet (12.5 mg total) by mouth at bedtime as needed. (Patient taking differently: Take 12.5 mg by mouth at bedtime.) 90 tablet 1   cloNIDine (CATAPRES) 0.1 MG tablet Take 0.1 mg by mouth daily.     leflunomide (ARAVA) 20 MG tablet Take 20 mg by mouth daily.     No current facility-administered medications on file prior to visit.    Allergies:  Allergies  Allergen Reactions   Dilaudid [Hydromorphone Hcl] Nausea And Vomiting    Hard to wake up   Bupropion     Other reaction(s):  possibly   Doxycycline Nausea And Vomiting    Other Reaction(s): GI Intolerance   Hydromorphone     Other reaction(s): Unknown   Topiramate Other (See Comments)    Suicidal thoughts per patient   Trazodone And Nefazodone Other (See Comments)    Hallucinations/ suicidal thoughts    Vital Signs:  BP (!) 165/95   Pulse 79   Ht 5' 8 (1.727 m)   Wt 218 lb (98.9 kg)   SpO2 97%   BMI 33.15 kg/m   Neurological Exam: MENTAL STATUS including orientation to time, place, person, recent and remote memory, attention span and concentration, language, and fund of knowledge is normal.  Speech is not dysarthric.  She is tearful during the visit.   CRANIAL NERVES:  Pupils are round and reactive. Extraocular muscles intact. No ptosis.  Face is symmetric.    MOTOR: Needle insertion sides without hematoma or erythema.  Motor strength is 5/5 in all extremities, except left dorsiflexion 5-/5 and toe extension 4/5.  No atrophy, fasciculations or abnormal movements.  No pronator drift.  Tone is normal.    MSRs:                                              Right        Left brachioradialis 2+   2+  biceps 2+   2+  triceps 2+   2+  patellar 2+   2+  ankle jerk 1+   1+  plantar response down   down    SENSORY:  Hyperesthesia to pin prick and temperature over the dorsum of the left foot. Vibration is reduced on the left foot.  Sensation to all modalities on the right leg is intact.  Romberg's sign absent.    COORDINATION/GAIT: Normal finger-to- nose-finger.  Intact rapid alternating movements bilaterally.    Antalgic gait, stable, and unassisted.    Data: NCS/EMG 12/14/2023: Chronic L5 radiculopathy, mild; these findings are stable as compared to prior study on 09/27/2022. There is no evidence of a large fiber  sensorimotor polyneuropathy.  NCS/EMG of the legs 09/27/2022: Chronic left L5 radiculopathy, mild. There is no evidence of a sensorimotor polyneuropathy affecting the lower  extremities.  MRI lumbar spine wo contrast 08/23/2022: 1. Left laminectomy L4-5 with resection of synovial cyst. No recurrent synovial cyst. Improvement in subarticular stenosis on the left. 2. Mild facet degeneration L5-S1 without neural impingement. 3. No cause for right leg radiculopathy identified.   MRI lumbar spine wo contrast 05/29/2021: L4-5 and L5-S1 facet osteoarthritis with mild anterolisthesis. At L4-5 there is a 5 mm left facet synovial cyst which compresses the left L5 nerve root at the subarticular recess.  MRI lumbar spine 07/30/2024: L4-5 PLIF without adverse features or recurrent stenosis Otherwise unremarkable lumbar spine.   Thank you for allowing me to participate in patient's care.  If I can answer any additional questions, I would be pleased to do so.    Sincerely,    Trennon Torbeck K. Tobie, DO

## 2024-10-15 DIAGNOSIS — E1165 Type 2 diabetes mellitus with hyperglycemia: Secondary | ICD-10-CM | POA: Diagnosis not present

## 2024-10-15 DIAGNOSIS — I10 Essential (primary) hypertension: Secondary | ICD-10-CM | POA: Diagnosis not present

## 2024-10-15 DIAGNOSIS — E782 Mixed hyperlipidemia: Secondary | ICD-10-CM | POA: Diagnosis not present

## 2024-10-15 DIAGNOSIS — E1142 Type 2 diabetes mellitus with diabetic polyneuropathy: Secondary | ICD-10-CM | POA: Diagnosis not present

## 2024-10-15 DIAGNOSIS — M0589 Other rheumatoid arthritis with rheumatoid factor of multiple sites: Secondary | ICD-10-CM | POA: Diagnosis not present

## 2024-10-15 DIAGNOSIS — M069 Rheumatoid arthritis, unspecified: Secondary | ICD-10-CM | POA: Diagnosis not present

## 2024-10-15 DIAGNOSIS — F419 Anxiety disorder, unspecified: Secondary | ICD-10-CM | POA: Diagnosis not present

## 2024-10-15 DIAGNOSIS — E7800 Pure hypercholesterolemia, unspecified: Secondary | ICD-10-CM | POA: Diagnosis not present

## 2024-10-15 DIAGNOSIS — J309 Allergic rhinitis, unspecified: Secondary | ICD-10-CM | POA: Diagnosis not present

## 2024-10-15 DIAGNOSIS — M961 Postlaminectomy syndrome, not elsewhere classified: Secondary | ICD-10-CM | POA: Diagnosis not present

## 2024-10-15 DIAGNOSIS — G4700 Insomnia, unspecified: Secondary | ICD-10-CM | POA: Diagnosis not present

## 2024-10-15 DIAGNOSIS — G8929 Other chronic pain: Secondary | ICD-10-CM | POA: Diagnosis not present

## 2024-10-15 DIAGNOSIS — E039 Hypothyroidism, unspecified: Secondary | ICD-10-CM | POA: Diagnosis not present

## 2024-10-15 DIAGNOSIS — K219 Gastro-esophageal reflux disease without esophagitis: Secondary | ICD-10-CM | POA: Diagnosis not present

## 2024-10-16 ENCOUNTER — Other Ambulatory Visit: Payer: Self-pay

## 2024-10-16 DIAGNOSIS — R202 Paresthesia of skin: Secondary | ICD-10-CM

## 2024-10-17 DIAGNOSIS — E669 Obesity, unspecified: Secondary | ICD-10-CM | POA: Diagnosis not present

## 2024-10-17 DIAGNOSIS — G478 Other sleep disorders: Secondary | ICD-10-CM | POA: Diagnosis not present

## 2024-10-17 DIAGNOSIS — Z6831 Body mass index (BMI) 31.0-31.9, adult: Secondary | ICD-10-CM | POA: Diagnosis not present

## 2024-10-17 DIAGNOSIS — G8929 Other chronic pain: Secondary | ICD-10-CM | POA: Diagnosis not present

## 2024-10-17 DIAGNOSIS — M961 Postlaminectomy syndrome, not elsewhere classified: Secondary | ICD-10-CM | POA: Diagnosis not present

## 2024-10-17 DIAGNOSIS — I10 Essential (primary) hypertension: Secondary | ICD-10-CM | POA: Diagnosis not present

## 2024-10-17 DIAGNOSIS — E1142 Type 2 diabetes mellitus with diabetic polyneuropathy: Secondary | ICD-10-CM | POA: Diagnosis not present

## 2024-10-17 DIAGNOSIS — N1831 Chronic kidney disease, stage 3a: Secondary | ICD-10-CM | POA: Diagnosis not present

## 2024-10-17 DIAGNOSIS — F331 Major depressive disorder, recurrent, moderate: Secondary | ICD-10-CM | POA: Diagnosis not present

## 2024-10-17 DIAGNOSIS — M069 Rheumatoid arthritis, unspecified: Secondary | ICD-10-CM | POA: Diagnosis not present

## 2024-10-17 DIAGNOSIS — G4700 Insomnia, unspecified: Secondary | ICD-10-CM | POA: Diagnosis not present

## 2024-10-22 ENCOUNTER — Other Ambulatory Visit (HOSPITAL_BASED_OUTPATIENT_CLINIC_OR_DEPARTMENT_OTHER): Payer: Self-pay | Admitting: Neurological Surgery

## 2024-10-22 DIAGNOSIS — E7800 Pure hypercholesterolemia, unspecified: Secondary | ICD-10-CM | POA: Diagnosis not present

## 2024-10-22 DIAGNOSIS — I10 Essential (primary) hypertension: Secondary | ICD-10-CM | POA: Diagnosis not present

## 2024-10-22 DIAGNOSIS — E1165 Type 2 diabetes mellitus with hyperglycemia: Secondary | ICD-10-CM | POA: Diagnosis not present

## 2024-10-22 DIAGNOSIS — E039 Hypothyroidism, unspecified: Secondary | ICD-10-CM | POA: Diagnosis not present

## 2024-10-22 DIAGNOSIS — M961 Postlaminectomy syndrome, not elsewhere classified: Secondary | ICD-10-CM

## 2024-10-22 DIAGNOSIS — M069 Rheumatoid arthritis, unspecified: Secondary | ICD-10-CM | POA: Diagnosis not present

## 2024-10-22 DIAGNOSIS — G609 Hereditary and idiopathic neuropathy, unspecified: Secondary | ICD-10-CM | POA: Diagnosis not present

## 2024-10-22 DIAGNOSIS — E049 Nontoxic goiter, unspecified: Secondary | ICD-10-CM | POA: Diagnosis not present

## 2024-10-23 DIAGNOSIS — H40033 Anatomical narrow angle, bilateral: Secondary | ICD-10-CM | POA: Diagnosis not present

## 2024-10-23 DIAGNOSIS — H40032 Anatomical narrow angle, left eye: Secondary | ICD-10-CM | POA: Diagnosis not present

## 2024-10-26 ENCOUNTER — Ambulatory Visit (HOSPITAL_BASED_OUTPATIENT_CLINIC_OR_DEPARTMENT_OTHER)
Admission: RE | Admit: 2024-10-26 | Discharge: 2024-10-26 | Attending: Neurological Surgery | Admitting: Neurological Surgery

## 2024-10-26 DIAGNOSIS — M961 Postlaminectomy syndrome, not elsewhere classified: Secondary | ICD-10-CM

## 2024-10-26 DIAGNOSIS — M5137 Other intervertebral disc degeneration, lumbosacral region with discogenic back pain only: Secondary | ICD-10-CM | POA: Diagnosis not present

## 2024-10-26 DIAGNOSIS — G8929 Other chronic pain: Secondary | ICD-10-CM | POA: Diagnosis not present

## 2024-10-26 DIAGNOSIS — M47816 Spondylosis without myelopathy or radiculopathy, lumbar region: Secondary | ICD-10-CM | POA: Diagnosis not present

## 2024-10-29 ENCOUNTER — Ambulatory Visit: Admitting: Neurology

## 2024-10-29 ENCOUNTER — Encounter: Payer: Self-pay | Admitting: Podiatry

## 2024-10-29 ENCOUNTER — Ambulatory Visit: Admitting: Podiatry

## 2024-10-29 DIAGNOSIS — M150 Primary generalized (osteo)arthritis: Secondary | ICD-10-CM | POA: Insufficient documentation

## 2024-10-29 DIAGNOSIS — F324 Major depressive disorder, single episode, in partial remission: Secondary | ICD-10-CM | POA: Insufficient documentation

## 2024-10-29 DIAGNOSIS — E559 Vitamin D deficiency, unspecified: Secondary | ICD-10-CM | POA: Insufficient documentation

## 2024-10-29 DIAGNOSIS — F4320 Adjustment disorder, unspecified: Secondary | ICD-10-CM | POA: Insufficient documentation

## 2024-10-29 DIAGNOSIS — G4700 Insomnia, unspecified: Secondary | ICD-10-CM | POA: Insufficient documentation

## 2024-10-29 DIAGNOSIS — F4321 Adjustment disorder with depressed mood: Secondary | ICD-10-CM | POA: Insufficient documentation

## 2024-10-29 DIAGNOSIS — H35039 Hypertensive retinopathy, unspecified eye: Secondary | ICD-10-CM | POA: Insufficient documentation

## 2024-10-29 DIAGNOSIS — M722 Plantar fascial fibromatosis: Secondary | ICD-10-CM | POA: Diagnosis not present

## 2024-10-29 DIAGNOSIS — M545 Low back pain, unspecified: Secondary | ICD-10-CM | POA: Insufficient documentation

## 2024-10-29 DIAGNOSIS — B009 Herpesviral infection, unspecified: Secondary | ICD-10-CM | POA: Insufficient documentation

## 2024-10-29 DIAGNOSIS — F419 Anxiety disorder, unspecified: Secondary | ICD-10-CM | POA: Insufficient documentation

## 2024-10-29 DIAGNOSIS — M461 Sacroiliitis, not elsewhere classified: Secondary | ICD-10-CM | POA: Insufficient documentation

## 2024-10-29 DIAGNOSIS — J309 Allergic rhinitis, unspecified: Secondary | ICD-10-CM | POA: Insufficient documentation

## 2024-10-29 DIAGNOSIS — G43009 Migraine without aura, not intractable, without status migrainosus: Secondary | ICD-10-CM | POA: Insufficient documentation

## 2024-10-29 DIAGNOSIS — K219 Gastro-esophageal reflux disease without esophagitis: Secondary | ICD-10-CM | POA: Insufficient documentation

## 2024-10-29 DIAGNOSIS — F331 Major depressive disorder, recurrent, moderate: Secondary | ICD-10-CM | POA: Insufficient documentation

## 2024-10-29 DIAGNOSIS — J4520 Mild intermittent asthma, uncomplicated: Secondary | ICD-10-CM | POA: Insufficient documentation

## 2024-10-29 DIAGNOSIS — H9313 Tinnitus, bilateral: Secondary | ICD-10-CM | POA: Insufficient documentation

## 2024-10-29 DIAGNOSIS — N1831 Chronic kidney disease, stage 3a: Secondary | ICD-10-CM | POA: Insufficient documentation

## 2024-10-29 DIAGNOSIS — N181 Chronic kidney disease, stage 1: Secondary | ICD-10-CM | POA: Insufficient documentation

## 2024-10-29 DIAGNOSIS — R1311 Dysphagia, oral phase: Secondary | ICD-10-CM | POA: Insufficient documentation

## 2024-10-29 DIAGNOSIS — G43E01 Chronic migraine with aura, not intractable, with status migrainosus: Secondary | ICD-10-CM | POA: Insufficient documentation

## 2024-10-29 DIAGNOSIS — Z860100 Personal history of colon polyps, unspecified: Secondary | ICD-10-CM | POA: Insufficient documentation

## 2024-10-29 DIAGNOSIS — G8929 Other chronic pain: Secondary | ICD-10-CM | POA: Insufficient documentation

## 2024-10-29 MED ORDER — TRIAMCINOLONE ACETONIDE 40 MG/ML IJ SUSP
40.0000 mg | Freq: Once | INTRAMUSCULAR | Status: AC
  Administered 2024-10-29: 14:00:00 40 mg

## 2024-10-29 NOTE — Progress Notes (Signed)
 She presents today for follow-up of her plantar fasciitis bilaterally she states that I need a shot in both of my feet if I can get 1 she states that my life has just been really and she says the neuropathy in my legs and feet the plantar fasciitis my back surgery the amputation of my toe chronic ingrown toenails all have inhibited me from being able to walk and work the worst appears to be my back with pain down my legs and into my feet.  Objective: Vital signs are stable alert oriented x 3.  Pulses are palpable those weekend.  Amputation second digit left foot.  She has pain on palpation of the inferior aspect of the navicular bone right at the medial band of the plantar fascia.  Posterior tibial tendon repair is moderately tender on palpation.  She has severe pain on palpation medial calcaneal tubercle of the right heel.  Assessment: Diabetes mellitus with severe peripheral neuropathy.  History of amputation portion of the foot.  Chronic proximal plantar fasciitis.  Rupture of her posterior tibial tendon left.  Severe back pain with radiating pain into her buttocks and left leg and foot.  Plan: I injected the areas today with 20 mg Kenalog  5 mg of Marcaine to the point of maximal tenderness.  I do recommend that she continue disability at this point due to all of her chronic pain and assessments.

## 2024-10-31 ENCOUNTER — Telehealth: Payer: Self-pay | Admitting: Podiatry

## 2024-10-31 DIAGNOSIS — Z6832 Body mass index (BMI) 32.0-32.9, adult: Secondary | ICD-10-CM | POA: Diagnosis not present

## 2024-10-31 DIAGNOSIS — Z1231 Encounter for screening mammogram for malignant neoplasm of breast: Secondary | ICD-10-CM | POA: Diagnosis not present

## 2024-10-31 DIAGNOSIS — Z1151 Encounter for screening for human papillomavirus (HPV): Secondary | ICD-10-CM | POA: Diagnosis not present

## 2024-10-31 DIAGNOSIS — Z01419 Encounter for gynecological examination (general) (routine) without abnormal findings: Secondary | ICD-10-CM | POA: Diagnosis not present

## 2024-10-31 DIAGNOSIS — Z1272 Encounter for screening for malignant neoplasm of vagina: Secondary | ICD-10-CM | POA: Diagnosis not present

## 2024-10-31 NOTE — Telephone Encounter (Signed)
 Faxed [employer] [facility] [phone] form/notes. No date to RTW per last office visit notes. She is still having chronic pain

## 2024-11-04 DIAGNOSIS — M961 Postlaminectomy syndrome, not elsewhere classified: Secondary | ICD-10-CM | POA: Diagnosis not present

## 2024-11-04 DIAGNOSIS — M461 Sacroiliitis, not elsewhere classified: Secondary | ICD-10-CM | POA: Diagnosis not present

## 2024-11-04 DIAGNOSIS — Z6831 Body mass index (BMI) 31.0-31.9, adult: Secondary | ICD-10-CM | POA: Diagnosis not present

## 2024-11-18 ENCOUNTER — Encounter: Attending: Registered Nurse | Admitting: Registered Nurse

## 2024-11-18 ENCOUNTER — Encounter: Payer: Self-pay | Admitting: Registered Nurse

## 2024-11-18 VITALS — BP 135/83 | HR 82 | Ht 68.0 in | Wt 215.0 lb

## 2024-11-18 DIAGNOSIS — M25561 Pain in right knee: Secondary | ICD-10-CM | POA: Insufficient documentation

## 2024-11-18 DIAGNOSIS — M2550 Pain in unspecified joint: Secondary | ICD-10-CM

## 2024-11-18 DIAGNOSIS — F1190 Opioid use, unspecified, uncomplicated: Secondary | ICD-10-CM | POA: Diagnosis not present

## 2024-11-18 DIAGNOSIS — M5416 Radiculopathy, lumbar region: Secondary | ICD-10-CM | POA: Diagnosis not present

## 2024-11-18 DIAGNOSIS — W19XXXD Unspecified fall, subsequent encounter: Secondary | ICD-10-CM | POA: Insufficient documentation

## 2024-11-18 DIAGNOSIS — G8929 Other chronic pain: Secondary | ICD-10-CM

## 2024-11-18 DIAGNOSIS — Z794 Long term (current) use of insulin: Secondary | ICD-10-CM | POA: Diagnosis not present

## 2024-11-18 DIAGNOSIS — Z79899 Other long term (current) drug therapy: Secondary | ICD-10-CM | POA: Diagnosis not present

## 2024-11-18 DIAGNOSIS — M545 Low back pain, unspecified: Secondary | ICD-10-CM | POA: Insufficient documentation

## 2024-11-18 DIAGNOSIS — M25511 Pain in right shoulder: Secondary | ICD-10-CM | POA: Diagnosis not present

## 2024-11-18 DIAGNOSIS — G5793 Unspecified mononeuropathy of bilateral lower limbs: Secondary | ICD-10-CM

## 2024-11-18 DIAGNOSIS — G629 Polyneuropathy, unspecified: Secondary | ICD-10-CM | POA: Diagnosis not present

## 2024-11-18 DIAGNOSIS — G894 Chronic pain syndrome: Secondary | ICD-10-CM | POA: Insufficient documentation

## 2024-11-18 DIAGNOSIS — M25512 Pain in left shoulder: Secondary | ICD-10-CM | POA: Insufficient documentation

## 2024-11-18 DIAGNOSIS — Z5181 Encounter for therapeutic drug level monitoring: Secondary | ICD-10-CM | POA: Insufficient documentation

## 2024-11-18 MED ORDER — TRAMADOL HCL 50 MG PO TABS: 50.0000 mg | ORAL_TABLET | Freq: Three times a day (TID) | ORAL | 1 refills | Status: DC | PRN

## 2024-11-18 NOTE — Progress Notes (Addendum)
 "  Subjective:    Patient ID: Amy Adams, female    DOB: [birth year], 59 y.o.   MRN: [id number]  HPI: Amy Adams is a 59 y.o. female who returns for follow up appointment for chronic pain and medication refill. She states her pain is located in her bilateral shoulders, lower back, right knee pain and generalized joint pain. She also reports tingling and burning in her right lower extremity, and right foot with numbness. She rates her pain 8. Her current exercise regime is walking and performing stretching exercises.  Amy Adams reports she had a fall on 11/16/2024, she was walking to her car and her left lower extremity went numb, she landed on her right side. She states her brother in law helped her up, she didn't seek medical attention. Educated on falls prevention, she verbalizes understanding.   Amy Adams Morphine  equivalent is 30.00 MME.   Oral Swab was Performed today.      Pain Inventory Average Pain 9 Pain Right Now 8 My pain is sharp, stabbing, tingling, and aching  In the last 24 hours, has pain interfered with the following? General activity 2 Relation with others 0 Enjoyment of life 2 What TIME of day is your pain at its worst? evening and night Sleep (in general) Poor  Pain is worse with: bending, sitting, standing, some activites, and other Pain improves with: medication Relief from Meds: 5  Family History  Problem Relation Age of Onset   Diabetes Mother    Migraines Mother    Arrhythmia Father    COPD Father    Cancer Father    Diabetes Sister    Social History   Socioeconomic History   Marital status: Widowed    Spouse name: Not on file   Number of children: 2   Years of education: Not on file   Highest education level: Not on file  Occupational History   Not on file  Tobacco Use   Smoking status: Never   Smokeless tobacco: Never  Vaping Use   Vaping status: Never Used  Substance and Sexual Activity   Alcohol  use: No   Drug use: Never    Sexual activity: Not on file  Other Topics Concern   Not on file  Social History Narrative   [place]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   Are you right handed or left handed? Right   Are you currently employed ? [employer]   What is your current occupation?   Do you live at home alone? yes   Caffeine  2 weekly soda   What type of home do you live in: 1 story or 2 story? one       Social Drivers of Health   Tobacco Use: Low Risk (11/18/2024)   Patient History    Smoking Tobacco Use: Never    Smokeless Tobacco Use: Never    Passive Exposure: Not on file  Financial Resource Strain: Not on file  Food Insecurity: Not on file  Transportation Needs: Not on file  Physical Activity: Not on file  Stress: Not on file  Social Connections: Not on file  Depression (PHQ2-9): Low Risk (05/27/2024)   Depression (PHQ2-9)    PHQ-2 Score: 0  Alcohol  Screen: Not on file  Housing: Not on file  Utilities: Not on file  Health Literacy: Not on file   Past Surgical History:  Procedure Laterality Date   ABDOMINAL HYSTERECTOMY  1990   with Right Salpingo--ophorectomy   ANAL SPHINCTEROTOMY  01-05-2006  dr weatherly @[facility]    BUNIONECTOMY  09-25-2009   dr verta @[facility]    right great toe   CARPAL TUNNEL RELEASE Bilateral 1996;  1997   CESAREAN SECTION  1988   FOOT SURGERY  12/ 2014   dr hyatt   left 2nd hammertoe repair,  left 2nd metatarsal matrixectomy, left heel endoscopic plantar fasiotomy   IR CATHETER TUBE CHANGE  01/01/2019   IR RADIOLOGIST EVAL & MGMT  11/28/2018   IR RADIOLOGIST EVAL & MGMT  11/29/2018   IR RADIOLOGIST EVAL & MGMT  12/06/2018   IR RADIOLOGIST EVAL & MGMT  12/25/2018   IR RADIOLOGIST EVAL & MGMT  01/08/2019   IR RADIOLOGIST EVAL & MGMT  01/29/2019   PANNICULECTOMY N/A 10/26/2018   Procedure: PANNICULECTOMY;  Surgeon: Gladis Cough, MD;  Location: [facility];  Service: General;  Laterality: N/A;   TENDON REPAIR  09/2015   left posterior tibial and peroneal tendon repairs   TOE AMPUTATION  01/2015    right 2nd toe   TONSILLECTOMY  age 40   TRANSOBTURATOR SLING  01-08-2004   dr watt @[facility]    Past Surgical History:  Procedure Laterality Date   ABDOMINAL HYSTERECTOMY  1990   with Right Salpingo--ophorectomy   ANAL SPHINCTEROTOMY  01-05-2006    dr lorriane @[facility]    BUNIONECTOMY  09-25-2009   dr verta @[facility]    right great toe   CARPAL TUNNEL RELEASE Bilateral 1996;  1997   CESAREAN SECTION  1988   FOOT SURGERY  12/ 2014   dr verta   left 2nd hammertoe repair,  left 2nd metatarsal matrixectomy, left heel endoscopic plantar fasiotomy   IR CATHETER TUBE CHANGE  01/01/2019   IR RADIOLOGIST EVAL & MGMT  11/28/2018   IR RADIOLOGIST EVAL & MGMT  11/29/2018   IR RADIOLOGIST EVAL & MGMT  12/06/2018   IR RADIOLOGIST EVAL & MGMT  12/25/2018   IR RADIOLOGIST EVAL & MGMT  01/08/2019   IR RADIOLOGIST EVAL & MGMT  01/29/2019   PANNICULECTOMY N/A 10/26/2018   Procedure: PANNICULECTOMY;  Surgeon: Gladis Cough, MD;  Location: [facility];  Service: General;  Laterality: N/A;   TENDON REPAIR  09/2015   left posterior tibial and peroneal tendon repairs   TOE AMPUTATION  01/2015   right 2nd toe   TONSILLECTOMY  age 42   TRANSOBTURATOR SLING  01-08-2004   dr watt @[facility]    Past Medical History:  Diagnosis Date   Anxiety    Arthritis    Chronic foot pain, left    GERD (gastroesophageal reflux disease)    History of anal fissures    Hyperlipidemia    Hypertension    Hypothyroidism    Insulin  dependent type 2 diabetes mellitus (HCC)    endrocrinologist-- dr tommas   Migraines    Peripheral neuropathy    RA (rheumatoid arthritis) (HCC)    rheumatologist-  dr mai   SUI (stress urinary incontinence, female)    Wears glasses    BP (!) 151/83 (Patient Position: Sitting)   Pulse 82   Ht 5' 8 (1.727 m)   Wt 215 lb (97.5 kg)   SpO2 96%   BMI 32.69 kg/m   Opioid Risk Score:   Fall Risk Score:  `1  Depression screen PHQ 2/9     05/27/2024    1:10 PM 06/14/2023    1:31 PM 02/21/2023    2:16 PM  03/11/2022    1:04 PM 09/02/2021    9:14 AM 08/12/2021    2:31 PM  12/23/2019    8:13 AM  Depression screen PHQ 2/9  Decreased Interest 0 3 3 0 0 0 0  Down, Depressed, Hopeless 0 1 3 0 0 1 0  PHQ - 2 Score 0 4 6 0 0 1 0  Altered sleeping      3   Tired, decreased energy      1   Change in appetite      3   Feeling bad or failure about yourself       0   Trouble concentrating      0   Moving slowly or fidgety/restless      0   Suicidal thoughts      0   PHQ-9 Score      8    Difficult doing work/chores      Not difficult at all      Data saved with a previous flowsheet row definition    Review of Systems  Musculoskeletal:  Positive for arthralgias, back pain and myalgias.       Right shoulder pain, low back pain, right lower leg pain, left leg pain  All other systems reviewed and are negative.      Objective:   Physical Exam Vitals and nursing note reviewed.  Constitutional:      Appearance: Normal appearance.  Cardiovascular:     Rate and Rhythm: Normal rate and regular rhythm.     Pulses: Normal pulses.     Heart sounds: Normal heart sounds.  Pulmonary:     Effort: Pulmonary effort is normal.     Breath sounds: Normal breath sounds.  Musculoskeletal:     Comments: Normal Muscle Bulk and Muscle Testing Reveals:  Upper Extremities: Full ROM and Muscle Strength 5/5 Bilateral AC Joint Tenderness Lumbar Paraspinal Tenderness: L-4-L-5 Lower Extremities: Full ROM and Muscle Strength 5/5 Bilateral Lower Extremities Flexion Produces Pain into her Lumbar Arises from Table with ease Narrow Based  Gait     Skin:    General: Skin is warm and dry.  Neurological:     Mental Status: She is alert and oriented to person, place, and time.  Psychiatric:        Mood and Affect: Mood normal.        Behavior: Behavior normal.          Assessment & Plan:  Right Lumbar Radiculitis/ Chronic Bilateral Low Back Pain without Sciatica: S/P   surgery on 11/11/2022: L4-5 posterior lumbar  interbody fusion by Dr Joshua.Continue HEP as Tolerated. . Continue current medication regimen. Continue to monitor. 11/18/2024 Painful Diabetic Neuropathy:  Continue Gabapentin .11/18/2024 3. Bilateral  Shoulder Pain: Continue HEP as Tolerated. Continue to Monitor. 11/18/2024 4. Chronic Pain Syndrome: Refilled Tramadol  50 mg  one tablet three times a  day as needed for pain # 270. We will continue the opioid monitoring program, this consists of regular clinic visits, examinations, urine drug screen, pill counts as well as use of [place]  Controlled Substance Reporting system. A 12 month History has been reviewed on the [place]  Controlled Substance Reporting System on 11/18/2024 5. Fall at home subsequent encounter. She was educated on falls prevention, she verbalizes understanding.   F/U in 6 months    "

## 2024-11-19 ENCOUNTER — Encounter: Payer: Self-pay | Admitting: Registered Nurse

## 2024-11-21 ENCOUNTER — Other Ambulatory Visit (HOSPITAL_COMMUNITY): Payer: Self-pay | Admitting: Nephrology

## 2024-11-21 DIAGNOSIS — N1831 Chronic kidney disease, stage 3a: Secondary | ICD-10-CM

## 2024-11-21 DIAGNOSIS — E1142 Type 2 diabetes mellitus with diabetic polyneuropathy: Secondary | ICD-10-CM

## 2024-11-21 LAB — DRUG TOX MONITOR 1 W/CONF, ORAL FLD
Amphetamines: NEGATIVE ng/mL
Barbiturates: NEGATIVE ng/mL
Benzodiazepines: NEGATIVE ng/mL
Buprenorphine: NEGATIVE ng/mL
Cocaine: NEGATIVE ng/mL
Fentanyl: NEGATIVE ng/mL
Heroin Metabolite: NEGATIVE ng/mL
MARIJUANA: NEGATIVE ng/mL
MDMA: NEGATIVE ng/mL
Meprobamate: NEGATIVE ng/mL
Methadone: NEGATIVE ng/mL
Nicotine Metabolite: NEGATIVE ng/mL
Opiates: NEGATIVE ng/mL
Phencyclidine: NEGATIVE ng/mL
Tapentadol: NEGATIVE ng/mL
Tramadol: NEGATIVE ng/mL
Zolpidem: NEGATIVE ng/mL

## 2024-11-21 LAB — DRUG TOX ALC METAB W/CON, ORAL FLD: Alcohol Metabolite: NEGATIVE ng/mL

## 2024-11-27 ENCOUNTER — Inpatient Hospital Stay (HOSPITAL_COMMUNITY)
Admission: EM | Admit: 2024-11-27 | Discharge: 2024-12-05 | DRG: 065 | Disposition: A | Discharge status: Rehabilitation Facility | Attending: Internal Medicine | Admitting: Internal Medicine

## 2024-11-27 ENCOUNTER — Emergency Department (HOSPITAL_COMMUNITY)

## 2024-11-27 ENCOUNTER — Other Ambulatory Visit: Payer: Self-pay

## 2024-11-27 ENCOUNTER — Encounter (HOSPITAL_COMMUNITY): Payer: Self-pay

## 2024-11-27 DIAGNOSIS — I69054 Hemiplegia and hemiparesis following nontraumatic subarachnoid hemorrhage affecting left non-dominant side: Secondary | ICD-10-CM | POA: Diagnosis not present

## 2024-11-27 DIAGNOSIS — F32A Depression, unspecified: Secondary | ICD-10-CM | POA: Diagnosis present

## 2024-11-27 DIAGNOSIS — H538 Other visual disturbances: Secondary | ICD-10-CM | POA: Diagnosis present

## 2024-11-27 DIAGNOSIS — G919 Hydrocephalus, unspecified: Secondary | ICD-10-CM

## 2024-11-27 DIAGNOSIS — G2581 Restless legs syndrome: Secondary | ICD-10-CM | POA: Diagnosis present

## 2024-11-27 DIAGNOSIS — E66812 Obesity, class 2: Secondary | ICD-10-CM | POA: Diagnosis present

## 2024-11-27 DIAGNOSIS — G914 Hydrocephalus in diseases classified elsewhere: Secondary | ICD-10-CM | POA: Diagnosis present

## 2024-11-27 DIAGNOSIS — E1142 Type 2 diabetes mellitus with diabetic polyneuropathy: Secondary | ICD-10-CM | POA: Diagnosis present

## 2024-11-27 DIAGNOSIS — I609 Nontraumatic subarachnoid hemorrhage, unspecified: Secondary | ICD-10-CM | POA: Diagnosis present

## 2024-11-27 DIAGNOSIS — Z6832 Body mass index (BMI) 32.0-32.9, adult: Secondary | ICD-10-CM

## 2024-11-27 DIAGNOSIS — R29700 NIHSS score 0: Secondary | ICD-10-CM | POA: Diagnosis not present

## 2024-11-27 DIAGNOSIS — I629 Nontraumatic intracranial hemorrhage, unspecified: Secondary | ICD-10-CM | POA: Diagnosis not present

## 2024-11-27 DIAGNOSIS — G4489 Other headache syndrome: Secondary | ICD-10-CM | POA: Diagnosis not present

## 2024-11-27 DIAGNOSIS — I604 Nontraumatic subarachnoid hemorrhage from basilar artery: Secondary | ICD-10-CM | POA: Diagnosis not present

## 2024-11-27 DIAGNOSIS — I10 Essential (primary) hypertension: Secondary | ICD-10-CM | POA: Diagnosis not present

## 2024-11-27 DIAGNOSIS — E039 Hypothyroidism, unspecified: Secondary | ICD-10-CM | POA: Diagnosis present

## 2024-11-27 DIAGNOSIS — G8929 Other chronic pain: Secondary | ICD-10-CM | POA: Diagnosis present

## 2024-11-27 DIAGNOSIS — Z90710 Acquired absence of both cervix and uterus: Secondary | ICD-10-CM

## 2024-11-27 DIAGNOSIS — I161 Hypertensive emergency: Secondary | ICD-10-CM | POA: Diagnosis present

## 2024-11-27 DIAGNOSIS — M069 Rheumatoid arthritis, unspecified: Secondary | ICD-10-CM | POA: Diagnosis present

## 2024-11-27 DIAGNOSIS — K219 Gastro-esophageal reflux disease without esophagitis: Secondary | ICD-10-CM | POA: Diagnosis present

## 2024-11-27 DIAGNOSIS — N1831 Chronic kidney disease, stage 3a: Secondary | ICD-10-CM | POA: Diagnosis present

## 2024-11-27 DIAGNOSIS — R29810 Facial weakness: Secondary | ICD-10-CM | POA: Diagnosis present

## 2024-11-27 DIAGNOSIS — Z825 Family history of asthma and other chronic lower respiratory diseases: Secondary | ICD-10-CM

## 2024-11-27 DIAGNOSIS — D84821 Immunodeficiency due to drugs: Secondary | ICD-10-CM | POA: Diagnosis present

## 2024-11-27 DIAGNOSIS — Z7985 Long-term (current) use of injectable non-insulin antidiabetic drugs: Secondary | ICD-10-CM | POA: Diagnosis not present

## 2024-11-27 DIAGNOSIS — M545 Low back pain, unspecified: Secondary | ICD-10-CM | POA: Diagnosis not present

## 2024-11-27 DIAGNOSIS — I608 Other nontraumatic subarachnoid hemorrhage: Secondary | ICD-10-CM | POA: Diagnosis not present

## 2024-11-27 DIAGNOSIS — Z833 Family history of diabetes mellitus: Secondary | ICD-10-CM

## 2024-11-27 DIAGNOSIS — E1122 Type 2 diabetes mellitus with diabetic chronic kidney disease: Secondary | ICD-10-CM | POA: Diagnosis present

## 2024-11-27 DIAGNOSIS — G4453 Primary thunderclap headache: Secondary | ICD-10-CM | POA: Diagnosis present

## 2024-11-27 DIAGNOSIS — Z79890 Hormone replacement therapy (postmenopausal): Secondary | ICD-10-CM

## 2024-11-27 DIAGNOSIS — E1165 Type 2 diabetes mellitus with hyperglycemia: Secondary | ICD-10-CM | POA: Diagnosis present

## 2024-11-27 DIAGNOSIS — M1990 Unspecified osteoarthritis, unspecified site: Secondary | ICD-10-CM | POA: Diagnosis not present

## 2024-11-27 DIAGNOSIS — R008 Other abnormalities of heart beat: Secondary | ICD-10-CM | POA: Diagnosis not present

## 2024-11-27 DIAGNOSIS — Z90721 Acquired absence of ovaries, unilateral: Secondary | ICD-10-CM

## 2024-11-27 DIAGNOSIS — E785 Hyperlipidemia, unspecified: Secondary | ICD-10-CM | POA: Diagnosis present

## 2024-11-27 DIAGNOSIS — Z7984 Long term (current) use of oral hypoglycemic drugs: Secondary | ICD-10-CM | POA: Diagnosis not present

## 2024-11-27 DIAGNOSIS — Z7960 Long term (current) use of unspecified immunomodulators and immunosuppressants: Secondary | ICD-10-CM

## 2024-11-27 DIAGNOSIS — I959 Hypotension, unspecified: Secondary | ICD-10-CM | POA: Diagnosis not present

## 2024-11-27 DIAGNOSIS — Z9079 Acquired absence of other genital organ(s): Secondary | ICD-10-CM

## 2024-11-27 DIAGNOSIS — Z794 Long term (current) use of insulin: Secondary | ICD-10-CM | POA: Diagnosis not present

## 2024-11-27 DIAGNOSIS — I129 Hypertensive chronic kidney disease with stage 1 through stage 4 chronic kidney disease, or unspecified chronic kidney disease: Secondary | ICD-10-CM | POA: Diagnosis present

## 2024-11-27 DIAGNOSIS — F419 Anxiety disorder, unspecified: Secondary | ICD-10-CM | POA: Diagnosis present

## 2024-11-27 DIAGNOSIS — Z7401 Bed confinement status: Secondary | ICD-10-CM | POA: Diagnosis not present

## 2024-11-27 DIAGNOSIS — M62838 Other muscle spasm: Secondary | ICD-10-CM | POA: Diagnosis present

## 2024-11-27 DIAGNOSIS — K5900 Constipation, unspecified: Secondary | ICD-10-CM | POA: Diagnosis present

## 2024-11-27 DIAGNOSIS — H409 Unspecified glaucoma: Secondary | ICD-10-CM | POA: Diagnosis present

## 2024-11-27 DIAGNOSIS — Z79899 Other long term (current) drug therapy: Secondary | ICD-10-CM

## 2024-11-27 LAB — COMPREHENSIVE METABOLIC PANEL WITH GFR
ALT: 11 U/L (ref 0–44)
AST: 26 U/L (ref 15–41)
Albumin: 4.4 g/dL (ref 3.5–5.0)
Alkaline Phosphatase: 79 U/L (ref 38–126)
Anion gap: 17 — ABNORMAL HIGH (ref 5–15)
BUN: 17 mg/dL (ref 6–20)
CO2: 20 mmol/L — ABNORMAL LOW (ref 22–32)
Calcium: 9.5 mg/dL (ref 8.9–10.3)
Chloride: 99 mmol/L (ref 98–111)
Creatinine, Ser: 1.12 mg/dL — ABNORMAL HIGH (ref 0.44–1.00)
GFR, Estimated: 56 mL/min — ABNORMAL LOW
Glucose, Bld: 219 mg/dL — ABNORMAL HIGH (ref 70–99)
Potassium: 3.4 mmol/L — ABNORMAL LOW (ref 3.5–5.1)
Sodium: 136 mmol/L (ref 135–145)
Total Bilirubin: 0.6 mg/dL (ref 0.0–1.2)
Total Protein: 7.2 g/dL (ref 6.5–8.1)

## 2024-11-27 LAB — ETHANOL: Alcohol, Ethyl (B): <15 mg/dL

## 2024-11-27 LAB — DIFFERENTIAL
Abs Immature Granulocytes: 0.04 K/uL (ref 0.00–0.07)
Basophils Absolute: 0 K/uL (ref 0.0–0.1)
Basophils Relative: 0 %
Eosinophils Absolute: 0 K/uL (ref 0.0–0.5)
Eosinophils Relative: 1 %
Immature Granulocytes: 1 %
Lymphocytes Relative: 19 %
Lymphs Abs: 1.4 K/uL (ref 0.7–4.0)
Monocytes Absolute: 0.6 K/uL (ref 0.1–1.0)
Monocytes Relative: 8 %
Neutro Abs: 5.4 K/uL (ref 1.7–7.7)
Neutrophils Relative %: 71 %

## 2024-11-27 LAB — CBC
HCT: 43.2 % (ref 36.0–46.0)
Hemoglobin: 14.3 g/dL (ref 12.0–15.0)
MCH: 27.9 pg (ref 26.0–34.0)
MCHC: 33.1 g/dL (ref 30.0–36.0)
MCV: 84.4 fL (ref 80.0–100.0)
Platelets: 224 K/uL (ref 150–400)
RBC: 5.12 MIL/uL — ABNORMAL HIGH (ref 3.87–5.11)
RDW: 12.9 % (ref 11.5–15.5)
WBC: 7.5 K/uL (ref 4.0–10.5)
nRBC: 0 % (ref 0.0–0.2)

## 2024-11-27 LAB — GLUCOSE, CAPILLARY: Glucose-Capillary: 228 mg/dL — ABNORMAL HIGH (ref 70–99)

## 2024-11-27 LAB — APTT: aPTT: 23 s — ABNORMAL LOW (ref 24–36)

## 2024-11-27 LAB — PROTIME-INR
INR: 0.9 (ref 0.8–1.2)
Prothrombin Time: 12.8 s (ref 11.4–15.2)

## 2024-11-27 MED ORDER — DIPHENHYDRAMINE HCL 50 MG/ML IJ SOLN
12.5000 mg | Freq: Once | INTRAMUSCULAR | Status: AC
  Administered 2024-11-27: 12.5 mg via INTRAVENOUS
  Filled 2024-11-27: qty 1

## 2024-11-27 MED ORDER — LEVETIRACETAM (KEPPRA) 500 MG/5 ML ADULT IV PUSH
1000.0000 mg | Freq: Once | INTRAVENOUS | Status: AC
  Administered 2024-11-27: 1000 mg via INTRAVENOUS
  Filled 2024-11-27: qty 10

## 2024-11-27 MED ORDER — LABETALOL HCL 5 MG/ML IV SOLN
20.0000 mg | Freq: Once | INTRAVENOUS | Status: AC
  Administered 2024-11-27: 20 mg via INTRAVENOUS
  Filled 2024-11-27: qty 4

## 2024-11-27 MED ORDER — POLYETHYLENE GLYCOL 3350 17 G PO PACK
17.0000 g | PACK | Freq: Every day | ORAL | Status: DC | PRN
  Administered 2024-12-03: 17 g via ORAL
  Filled 2024-11-27 (×2): qty 1

## 2024-11-27 MED ORDER — ACETAMINOPHEN 500 MG PO TABS
1000.0000 mg | ORAL_TABLET | Freq: Once | ORAL | Status: AC
  Administered 2024-11-27: 1000 mg via ORAL
  Filled 2024-11-27: qty 2

## 2024-11-27 MED ORDER — SENNA 8.6 MG PO TABS
1.0000 | ORAL_TABLET | Freq: Two times a day (BID) | ORAL | Status: DC | PRN
  Administered 2024-12-03 – 2024-12-05 (×3): 8.6 mg via ORAL
  Filled 2024-11-27 (×3): qty 1

## 2024-11-27 MED ORDER — CLEVIDIPINE BUTYRATE 0.5 MG/ML IV EMUL
0.0000 mg/h | INTRAVENOUS | Status: DC
  Administered 2024-11-27: 2 mg/h via INTRAVENOUS
  Administered 2024-11-27: 20 mg/h via INTRAVENOUS
  Administered 2024-11-28: 9 mg/h via INTRAVENOUS
  Administered 2024-11-28: 2 mg/h via INTRAVENOUS
  Administered 2024-11-28: 7 mg/h via INTRAVENOUS
  Administered 2024-11-28: 2 mg/h via INTRAVENOUS
  Administered 2024-11-28: 7 mg/h via INTRAVENOUS
  Administered 2024-11-29: 2 mg/h via INTRAVENOUS
  Filled 2024-11-27 (×7): qty 50

## 2024-11-27 MED ORDER — PROCHLORPERAZINE EDISYLATE 10 MG/2ML IJ SOLN
5.0000 mg | Freq: Once | INTRAMUSCULAR | Status: AC
  Administered 2024-11-27: 5 mg via INTRAVENOUS
  Filled 2024-11-27: qty 2

## 2024-11-27 MED ORDER — FENTANYL CITRATE (PF) 100 MCG/2ML IJ SOLN
50.0000 ug | Freq: Once | INTRAMUSCULAR | Status: AC
  Administered 2024-11-27: 50 ug via INTRAVENOUS
  Filled 2024-11-27: qty 2

## 2024-11-27 MED ORDER — ONDANSETRON HCL 4 MG/2ML IJ SOLN
4.0000 mg | Freq: Four times a day (QID) | INTRAMUSCULAR | Status: DC | PRN
  Administered 2024-11-27 – 2024-12-04 (×4): 4 mg via INTRAVENOUS
  Filled 2024-11-27 (×3): qty 2

## 2024-11-27 MED ORDER — LABETALOL HCL 5 MG/ML IV SOLN
20.0000 mg | INTRAVENOUS | Status: DC | PRN
  Administered 2024-11-28 (×2): 20 mg via INTRAVENOUS
  Filled 2024-11-27 (×2): qty 4

## 2024-11-27 MED ORDER — LABETALOL HCL 5 MG/ML IV SOLN
INTRAVENOUS | Status: AC
  Filled 2024-11-27: qty 4

## 2024-11-27 MED ORDER — ONDANSETRON HCL 4 MG/2ML IJ SOLN
INTRAMUSCULAR | Status: AC
  Filled 2024-11-27: qty 2

## 2024-11-27 MED ORDER — CLEVIDIPINE BUTYRATE 0.5 MG/ML IV EMUL
INTRAVENOUS | Status: AC
  Filled 2024-11-27: qty 50

## 2024-11-27 MED ORDER — LABETALOL HCL 5 MG/ML IV SOLN
20.0000 mg | Freq: Once | INTRAVENOUS | Status: AC
  Administered 2024-11-27: 20 mg via INTRAVENOUS

## 2024-11-27 MED ORDER — CHLORHEXIDINE GLUCONATE CLOTH 2 % EX PADS
6.0000 | MEDICATED_PAD | Freq: Every day | CUTANEOUS | Status: DC
  Administered 2024-11-28 – 2024-11-29 (×2): 6 via TOPICAL

## 2024-11-27 MED ORDER — INSULIN ASPART 100 UNIT/ML IJ SOLN
0.0000 [IU] | INTRAMUSCULAR | Status: DC
  Administered 2024-11-28: 2 [IU] via SUBCUTANEOUS
  Administered 2024-11-28 (×2): 1 [IU] via SUBCUTANEOUS
  Administered 2024-11-28: 3 [IU] via SUBCUTANEOUS
  Administered 2024-11-28: 1 [IU] via SUBCUTANEOUS
  Administered 2024-11-28: 2 [IU] via SUBCUTANEOUS
  Administered 2024-11-28 – 2024-11-29 (×2): 1 [IU] via SUBCUTANEOUS
  Administered 2024-11-29 (×2): 3 [IU] via SUBCUTANEOUS
  Filled 2024-11-27: qty 3
  Filled 2024-11-27: qty 2
  Filled 2024-11-27: qty 3
  Filled 2024-11-27 (×2): qty 1
  Filled 2024-11-27: qty 2
  Filled 2024-11-27: qty 3
  Filled 2024-11-27: qty 2
  Filled 2024-11-27 (×2): qty 1

## 2024-11-27 MED ORDER — IOHEXOL 350 MG/ML SOLN
75.0000 mL | Freq: Once | INTRAVENOUS | Status: AC | PRN
  Administered 2024-11-27: 75 mL via INTRAVENOUS

## 2024-11-27 NOTE — Progress Notes (Addendum)
 eLink Physician-Brief Progress Note Patient Name: Amy Adams DOB: June 21, 1966 MRN: [id number]   Date of Service  11/27/2024  HPI/Events of Note  eICU Brief new admit note: 59 y.o. 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 On cleviprex .Neuro surgeon and neurology on board.   Camera: Getting neuro cheks. On room air. MAP 89 SBP 140 Afebrile HR 88  Data: Hypokalemia at 3.4 Cr 1.12 Glucose 219 Co2 at 20 LFT/CBC normal INR 0.9 2017: EF 55%.  CTA/CT per Neuro surgery: perimesencephalic non-aneurysmal SAH, and the CTA is of good quality and negative.   eICU Interventions  Seizure and aspiration precautions Keppra  HOB > 30 SBP target < 150. CBG goals < 180.ssi.  Echo,.     Intervention Category Major Interventions: Hypertension - evaluation and management Evaluation Type: New Patient Evaluation  Amy Adams 11/27/2024, 11:47 PM  0234 pt c/o restless legs, back pain and head ache. Has has tylenol ,not effective.  Pt has multiple meds on home list Home meds list reviewed. - Neurontin  200 mg oral once ordered for now.

## 2024-11-27 NOTE — Consult Note (Signed)
 Stroke Neurology Consultation Note  Consult Requested by: Dr. Francesca  Reason for Consult: code stroke  Consult Date: 11/27/2024   The history was obtained from the pt and EMS.  During history and examination, all items were able to obtain unless otherwise noted.  History of Present Illness:  Amy Adams is a 59 y.o. Caucasian female with PMH of hypertension, hyperlipidemia, diabetes, lower back pain, arthritis, CKD 3 AA presented to ED for code stroke.  Per patient, she had right eye laser surgery 6 weeks ago for glaucoma.  However, she continued to have pain behind right eye since laser surgery.  Last Thursday, she had blurry vision on the right eye, only seeing white out images.  She went to see her eye doctor and was told eye looks fine.  Today around 5 PM she started have neck spasm which triggers over extremity numbness.  EMS was called, she was sent to ER for evaluation.  Glucose 232.  In ER, she was found to have left facial droop and left eye difficulty opening with nausea vomiting in CT scanner.  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  Patient BP 181/89, gave labetalol  and put on Cleviprex  drip.  Zofran  for vomiting.  CTA head and neck no aneurysm.  Patient not on blood thinners as home meds.  LSN: 5 PM TNK Given: No: SAH IR Thrombectomy? No, SAH Modified Rankin Scale: 2-Slight disability-UNABLE to perform all activities but does not need assistance  Past Medical History:  Diagnosis Date   Anxiety    Arthritis    Chronic foot pain, left    GERD (gastroesophageal reflux disease)    History of anal fissures    Hyperlipidemia    Hypertension    Hypothyroidism    Insulin  dependent type 2 diabetes mellitus (HCC)    endrocrinologist-- dr tommas   Migraines    Peripheral neuropathy    RA (rheumatoid arthritis) [facility])    rheumatologist-  dr mai   SUI (stress urinary incontinence, female)    Wears glasses     Past Surgical History:  Procedure Laterality  Date   ABDOMINAL HYSTERECTOMY  1990   with Right Salpingo--ophorectomy   ANAL SPHINCTEROTOMY  01-05-2006    dr lorriane @[facility]    BUNIONECTOMY  09-25-2009   dr verta @[facility]    right great toe   CARPAL TUNNEL RELEASE Bilateral 1996;  1997   CESAREAN SECTION  1988   FOOT SURGERY  12/ 2014   dr verta   left 2nd hammertoe repair,  left 2nd metatarsal matrixectomy, left heel endoscopic plantar fasiotomy   IR CATHETER TUBE CHANGE  01/01/2019   IR RADIOLOGIST EVAL & MGMT  11/28/2018   IR RADIOLOGIST EVAL & MGMT  11/29/2018   IR RADIOLOGIST EVAL & MGMT  12/06/2018   IR RADIOLOGIST EVAL & MGMT  12/25/2018   IR RADIOLOGIST EVAL & MGMT  01/08/2019   IR RADIOLOGIST EVAL & MGMT  01/29/2019   PANNICULECTOMY N/A 10/26/2018   Procedure: PANNICULECTOMY;  Surgeon: Gladis Cough, MD;  Location: [facility];  Service: General;  Laterality: N/A;   TENDON REPAIR  09/2015   left posterior tibial and peroneal tendon repairs   TOE AMPUTATION  01/2015   right 2nd toe   TONSILLECTOMY  age 32   TRANSOBTURATOR SLING  01-08-2004   dr watt @[facility]     Family History  Problem Relation Age of Onset   Diabetes Mother    Migraines Mother    Arrhythmia Father    COPD  Father    Cancer Father    Diabetes Sister     Social History:  reports that she has never smoked. She has never used smokeless tobacco. She reports that she does not drink alcohol  and does not use drugs.  Allergies: Allergies[1]  Medications Ordered Prior to Encounter[2]  Review of Systems: A full ROS was attempted today and was able to be performed.  Systems assessed include - Constitutional, Eyes, HENT, Respiratory, Cardiovascular, Gastrointestinal, Genitourinary, Integument/breast, Hematologic/lymphatic, Musculoskeletal, Neurological, Behavioral/Psych, Endocrine, Allergic/Immunologic - with pertinent responses as per HPI.  Physical Examination: Temp:  [97.5 F (36.4 C)] 97.5 F (36.4 C) (01/14 1850) Pulse Rate:  [84-86] 84 (01/14 1851) Resp:  [24-29]  24 (01/14 1851) BP: (181)/(89) 181/89 (01/14 1850) SpO2:  [100 %] 100 % (01/14 1851) Weight:  [98 kg] 98 kg (01/14 1903)  General - well nourished, well developed, in mild distress with eye pain.    Ophthalmologic - fundi not visualized due to noncooperation.    Cardiovascular - regular rhythm and rate  Mental Status -  Level of arousal and orientation to time, place, and person were intact. Language including expression, naming, repetition, comprehension was assessed and found intact. Fund of Knowledge was assessed and was intact.  Cranial Nerves II - XII - II - Vision intact OU. III, IV, VI - Extraocular movements intact. V - Facial sensation symmetric bilaterally, but complaining of subjective tingling VII - Facial movement intact bilaterally. VIII - Hearing & vestibular intact bilaterally. X - Palate elevates symmetrically. XI - Chin turning & shoulder shrug intact bilaterally. XII - Tongue protrusion intact.  Motor Strength - The patients strength was normal in all extremities and pronator drift was absent.   Motor Tone & Bulk - Muscle tone was assessed at the neck and appendages and was normal.  Bulk was normal and fasciculations were absent.   Reflexes - The patients reflexes were normal in all extremities and she had no pathological reflexes.  Sensory - Light touch, temperature/pinprick were assessed and were normal.    Coordination - The patient had normal movements in the hands and feet with no ataxia or dysmetria.  Tremor was absent.  Gait and Station - deferred  NIHSS = 0  Data Reviewed: CT HEAD CODE STROKE WO CONTRAST (LKW 0-4.5h, LVO 0-24h) Result Date: 11/27/2024 EXAM: CT HEAD WITHOUT CONTRAST 11/27/2024 06:44:30 PM TECHNIQUE: CT of the head was performed without the administration of intravenous contrast. Automated exposure control, iterative reconstruction, and/or weight based adjustment of the mA/kV was utilized to reduce the radiation dose to as low as  reasonably achievable. COMPARISON: None available. CLINICAL HISTORY: Neuro deficit, acute, stroke suspected. FINDINGS: BRAIN AND VENTRICLES: Large volume subarachnoid hemorrhage in the basilar cisterns. Hemorrhage fills and slightly expands the prepontine cistern as well as blood within the suprasellar cistern. There are areas of hemorrhage involving the interpeduncular cistern, ambient cisterns, and quadrigeminal cisterns. Hemorrhage extends over the superior aspect of the cerebellar vermis. Few additional areas of subarachnoid hemorrhage posteriorly along the cerebellum. There is streak artifact at the anterior skull base without definite hemorrhage in this region. Mild hydrocephalus. No evidence of acute infarct. No significant edema. No mass effect or midline shift. Recommend CTA for further evaluation of aneurysm. ORBITS: No acute abnormality. SINUSES: No acute abnormality. SOFT TISSUES AND SKULL: No acute soft tissue abnormality. No skull fracture. IMPRESSION: 1. Large volume subarachnoid hemorrhage centered in the basilar cisterns. 2. Mild hydrocephalus. 3. Recommend CTA for further evaluation for aneurysm. 4. Findings messaged  to Dr. Jerri via the [facility] messaging systemt at 7:02 PM on 11/27/24. Electronically signed by: Donnice Mania MD 11/27/2024 07:04 PM EST RP Workstation: [id number]    Assessment: 59 y.o. female with PMH of hypertension, hyperlipidemia, diabetes, lower back pain, arthritis, CKD 3 A presented to ED for neck spasm and all extremity numbness associate with nausea vomiting.  Time onset 5 PM.  Currently NIH score 0.  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  Patient BP 181/89, gave labetalol  and put on Cleviprex  drip.  Zofran  for vomiting.  CTA head and neck no aneurysm.   Of note, she had right eye laser surgery 6 weeks ago for glaucoma.  However, she continued to have pain behind right eye since laser surgery.  Last Thursday, she had blurry vision on the right eye, only seeing  white out images.    Pt SAH could be triggers but high BP from prolonged behind eye pain. SAH concentrated at basilar cistern, concerning for venous bleed. Pt will transfer to [facility] for admission.   Plan: Transfer to [facility] for ICU admission with CCM for close vital sign monitoring and neuro neck NeuroIR consultation - talked with Dr. Lester, he is aware.   Stat CT head if acute neuro changes Blood pressure management - Target SBP <160 now on cleviprex  drip  S/p Keppra  1000mg  load and then 500mg  bid Head of bed elevation > 30 degrees if not contraindicated Discussed with Dr. Francesca ED physician  Thank you for this consultation and allowing us  to participate in the care of this patient.  Ary Jerri, MD PhD Stroke Neurology 11/27/2024 7:18 PM      [1]  Allergies Allergen Reactions   Dilaudid [Hydromorphone Hcl] Nausea And Vomiting    Hard to wake up   Bupropion     Other reaction(s): possibly   Doxycycline Nausea And Vomiting    Other Reaction(s): GI Intolerance   Hydromorphone     Other reaction(s): Unknown   Topiramate Other (See Comments)    Suicidal thoughts per patient   Trazodone And Nefazodone Other (See Comments)    Hallucinations/ suicidal thoughts  [2]  No current facility-administered medications on file prior to encounter.   Current Outpatient Medications on File Prior to Encounter  Medication Sig Dispense Refill   ALPRAZolam  (XANAX ) 1 MG tablet Take 1 tablet (1 mg total) by mouth 3 (three) times daily as needed. 270 tablet 1   ALPRAZolam  (XANAX ) 1 MG tablet Take 1 tablet (1 mg total) by mouth 3 (three) times daily as needed. 270 tablet 0   ALPRAZolam  (XANAX ) 1 MG tablet Take 1 tablet (1 mg total) by mouth 3 (three) times daily as needed. 270 tablet 1   Blood Pressure Monitoring (OMRON 3 SERIES BP MONITOR) DEVI Use as directed 1 each 0   chlorthalidone  (HYGROTON ) 25 MG tablet Take 1 tablet (25 mg total) by mouth daily with breakfast for blood pressure. 90 tablet 3    cloNIDine (CATAPRES) 0.1 MG tablet Take 0.1 mg by mouth daily.     Continuous Glucose Sensor (DEXCOM G7 SENSOR) MISC use as directed - every 10 days 9 each 4   Continuous Glucose Sensor (DEXCOM G7 SENSOR) MISC change every 10 days 3 each 11   cyclobenzaprine  (FLEXERIL ) 10 MG tablet Take 1 tablet (10 mg total) by mouth 3 (three) times daily as needed for muscle spasms. 270 tablet 0   diclofenac (VOLTAREN) 50 MG EC tablet Take 50 mg by mouth 2 (two) times daily as  needed.     DULoxetine  (CYMBALTA ) 60 MG capsule Take 1 capsule (60 mg total) by mouth 2 (two) times daily. 180 capsule 3   fenofibrate  micronized (LOFIBRA) 134 MG capsule Take 1 capsule (134 mg total) by mouth daily. 90 capsule 3   fluconazole  (DIFLUCAN ) 150 MG tablet Take 1 tablet (150 mg total) by mouth daily. May repeat for 1 time (Patient taking differently: Take 150 mg by mouth as needed.) 2 tablet 0   folic acid  (FOLVITE ) 1 MG tablet Take 2 tablets (2 mg total) by mouth daily. 180 tablet 3   gabapentin  (NEURONTIN ) 400 MG capsule Take 1 capsule (400 mg total) by mouth 3 (three) times daily. 270 capsule 4   hydrochlorothiazide  (HYDRODIURIL ) 25 MG tablet Take 1 tablet (25 mg total) by mouth daily. (Patient taking differently: Take 25 mg by mouth daily. Every othe day) 45 tablet 4   hydroxychloroquine  (PLAQUENIL ) 200 MG tablet Take 2 tablets (400 mg total) by mouth once daily with food or milk. 180 tablet 0   insulin  lispro (HUMALOG  KWIKPEN) 100 UNIT/ML KwikPen Inject 10-15 Units into the skin 3 (three) times daily. 45 mL 5   insulin  lispro (HUMALOG  KWIKPEN) 100 UNIT/ML KwikPen Inject 10-15 Units into the skin 3 (three) times daily. 45 mL 5   insulin  lispro (HUMALOG  KWIKPEN) 100 UNIT/ML KwikPen Inject 10-15 Units into the skin 3 (three) times daily for 30 days. 16 mL 5   insulin  lispro (HUMALOG ) 100 UNIT/ML injection Inject 6-15 Units into the skin 3 (three) times daily with meals. 6-15 units sliding scale AS NEEDED     ketorolac   (ACULAR ) 0.5 % ophthalmic solution Place 1 drop into the right eye 4 (four) times daily.     leflunomide  (ARAVA ) 20 MG tablet Take 20 mg by mouth daily.     levocetirizine (XYZAL ) 5 MG tablet Take 1 tablet (5 mg total) by mouth every evening. 90 tablet 3   levothyroxine  (SYNTHROID ) 50 MCG tablet Take 1 tablet (50 mcg total) by mouth in the morning on an empty stomach 90 tablet 4   lidocaine  (LIDODERM ) 5 % Place 1 patch onto the skin once daily, remove after 12 (twelve) hours. 60 patch 3   metFORMIN  (GLUCOPHAGE -XR) 500 MG 24 hr tablet Take 2 tablets (1,000 mg total) by mouth 2 (two) times daily. 360 tablet 4   metFORMIN  (GLUCOPHAGE -XR) 500 MG 24 hr tablet Take 2 tablets (1,000 mg total) by mouth 2 (two) times daily. 120 tablet 5   metoprolol  succinate (TOPROL -XL) 25 MG 24 hr tablet Take 25 mg by mouth daily.     montelukast  (SINGULAIR ) 10 MG tablet Take 1 tablet (10 mg total) by mouth every evening. 90 tablet 3   montelukast  (SINGULAIR ) 10 MG tablet Take 1 tablet (10 mg total) by mouth every evening. 90 tablet 3   omeprazole  (PRILOSEC) 40 MG capsule Take 1 capsule (40 mg total) by mouth 2 (two) times daily. 180 capsule 3   tirzepatide  (MOUNJARO ) 12.5 MG/0.5ML Pen Inject 12.5 mg into the skin once a week. 6 mL 4   traMADol  (ULTRAM ) 50 MG tablet Take 1 tablet (50 mg total) by mouth 3 (three) times daily as needed. 270 tablet 1   TRESIBA FLEXTOUCH 100 UNIT/ML FlexTouch Pen Inject into the skin daily.     TUBERCULIN SYR 1CC/27GX1/2 27G X 1/2 1 ML MISC Inject into the skin once a week with methotrexate  13 each 3   UNIFINE PENTIPS 31G X 5 MM MISC USE AS DIRECTED 5 TIMES  PER DAY  4   valACYclovir  (VALTREX ) 1000 MG tablet Take 2 tablets (2,000 mg total) by mouth 2 (two) times daily for 1 day when needed. 12 tablet 3   valsartan  (DIOVAN ) 320 MG tablet Take 1 tablet (320 mg total) by mouth daily. 90 tablet 3   zolpidem  (AMBIEN  CR) 12.5 MG CR tablet Take 1 tablet (12.5 mg total) by mouth at bedtime as  needed. (Patient taking differently: Take 12.5 mg by mouth at bedtime.) 90 tablet 1

## 2024-11-27 NOTE — ED Triage Notes (Signed)
 Pt bib rcems with complaints of an ocular headache and neck spasms. 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 Patient states that when the neck spasms started her extremities went numb.

## 2024-11-27 NOTE — ED Notes (Signed)
 This RN and PA Celeste completed an Aox4 assessment due to pt complaining of increasing head pain. Pt was able to answer all questions correctly at this time.

## 2024-11-27 NOTE — H&P (Signed)
 "  NAME:  Amy Adams, MRN:  [id number], DOB:  Dec 20, 1965, LOS: 0 ADMISSION DATE:  11/27/2024, CONSULTATION DATE:  11/27/24 REFERRING MD:  EDP, CHIEF COMPLAINT:  acute sah   History of Present Illness:  59 yo female presented with neck spasm, all extremity numbness, n/v and L facial droop. Pt reports having laser eye surgery 6 weeks ago for glaucoma and having continued pain behind R eye since procedure with headache. Last Thursday she had blurry vision in the R eye and seeing only white out images, she went to her eye dr and was sent home without issue.   At 1700 today she began having neck spasms that triggered upper extremity and facial numbness. She called EMS and presented to [facility] as a code stroke. At presentation she was found to have L facial droop and difficulty opening L eye. During her time there she began having N/V. She was sent to CT and found to have basilar cistern SAH with mild hydrocephalus. BP noted to be elevated with systolics in the 180-190's. She was given a single dose of labetalol  and started on cleviprex  infusion. CTA negative for aneurysm. Neurology called and recommended Neuro IR eval and CCM to admit. She was given keppra  for sz prophylaxis and sent to [facility].   Pt endorses ha continues at this time but numbness is improving. Worse on L than R but also has poorly controlled RLS at baseline, has not had her meds and her L leg is typically more painful/numb than R.   Pertinent  Medical History  CKD3a Htn Hyperlipidemia Glaucoma T2dm with hyperglycemia Arthritis Chronic low back pain RLS  Significant Hospital Events: Including procedures, antibiotic start and stop dates in addition to other pertinent events   Admitted to ICU at [facility] from [facility] 1/14  Interim History / Subjective:    Objective    Blood pressure (!) 229/99, pulse 83, temperature (!) 97.5 F (36.4 C), temperature source Oral, resp. rate (!) 21, height 5' 8 (1.727 m), weight 98 kg, SpO2 99%.       No  intake or output data in the 24 hours ending 11/27/24 2019 Filed Weights   11/27/24 1903  Weight: 98 kg    Examination: General: acutely ill appearing. Laying in bed L facial droop HENT: ncat, eomi, perrla mmmp L facial droop noted Lungs: ctab Cardiovascular: rrr Abdomen: soft, nt, nd bs+ Extremities: no c/c/e Neuro: decreased sensation in all extremities and face (forehead worse than lower face) L>>R, otherwise no weakness appreciated. No focal deficits with exception of L facial droop. Speech clear but slow GU: deferred  Resolved problem list   Assessment and Plan  Acute SAH Mild hydrocephalus Htn emergency Nausea/vomiting Hyperlipidemia Dm2 with hyperglycemia Chronic low back pain Chronic arthritis Ckd3a Glaucoma -per neuro -on cleviprex  titrate for SBP <160 -aed per neuro -neuroIR to see as well -supportive care with zofran  -ssi -monitor renal indices and I/O with contrasted studies -pain control as able -resume home meds after swallow study -echo  -speech/pt/ot   Labs   CBC: Recent Labs  Lab 11/27/24 1832  WBC 7.5  NEUTROABS 5.4  HGB 14.3  HCT 43.2  MCV 84.4  PLT 224    Basic Metabolic Panel: Recent Labs  Lab 11/27/24 1832  NA 136  K 3.4*  CL 99  CO2 20*  GLUCOSE 219*  BUN 17  CREATININE 1.12*  CALCIUM  9.5   GFR: Estimated Creatinine Clearance: 65.4 mL/min (A) (by C-G formula based on SCr of 1.12 mg/dL (  H)). Recent Labs  Lab 11/27/24 1832  WBC 7.5    Liver Function Tests: Recent Labs  Lab 11/27/24 1832  AST 26  ALT 11  ALKPHOS 79  BILITOT 0.6  PROT 7.2  ALBUMIN 4.4   No results for input(s): LIPASE, AMYLASE in the last 168 hours. No results for input(s): AMMONIA in the last 168 hours.  ABG No results found for: PHART, PCO2ART, PO2ART, HCO3, TCO2, ACIDBASEDEF, O2SAT   Coagulation Profile: Recent Labs  Lab 11/27/24 1832  INR 0.9    Cardiac Enzymes: No results for input(s): CKTOTAL, CKMB,  CKMBINDEX, TROPONINI in the last 168 hours.  HbA1C: Hgb A1c MFr Bld  Date/Time Value Ref Range Status  10/22/2018 04:20 PM 6.5 (H) 4.8 - 5.6 % Final    Comment:    (NOTE) Pre diabetes:          5.7%-6.4% Diabetes:              >6.4% Glycemic control for   <7.0% adults with diabetes     CBG: Recent Labs  Lab 11/27/24 1837  GLUCAP 228*    Review of Systems:   As per HPI  Past Medical History:  She,  has a past medical history of Anxiety, Arthritis, Chronic foot pain, left, GERD (gastroesophageal reflux disease), History of anal fissures, Hyperlipidemia, Hypertension, Hypothyroidism, Insulin  dependent type 2 diabetes mellitus (HCC), Migraines, Peripheral neuropathy, RA (rheumatoid arthritis) (HCC), SUI (stress urinary incontinence, female), and Wears glasses.   Surgical History:   Past Surgical History:  Procedure Laterality Date   ABDOMINAL HYSTERECTOMY  1990   with Right Salpingo--ophorectomy   ANAL SPHINCTEROTOMY  01-05-2006    dr lorriane @[facility]    BUNIONECTOMY  09-25-2009   dr verta @[facility]    right great toe   CARPAL TUNNEL RELEASE Bilateral 1996;  1997   CESAREAN SECTION  1988   FOOT SURGERY  12/ 2014   dr verta   left 2nd hammertoe repair,  left 2nd metatarsal matrixectomy, left heel endoscopic plantar fasiotomy   IR CATHETER TUBE CHANGE  01/01/2019   IR RADIOLOGIST EVAL & MGMT  11/28/2018   IR RADIOLOGIST EVAL & MGMT  11/29/2018   IR RADIOLOGIST EVAL & MGMT  12/06/2018   IR RADIOLOGIST EVAL & MGMT  12/25/2018   IR RADIOLOGIST EVAL & MGMT  01/08/2019   IR RADIOLOGIST EVAL & MGMT  01/29/2019   PANNICULECTOMY N/A 10/26/2018   Procedure: PANNICULECTOMY;  Surgeon: Gladis Cough, MD;  Location: [facility];  Service: General;  Laterality: N/A;   TENDON REPAIR  09/2015   left posterior tibial and peroneal tendon repairs   TOE AMPUTATION  01/2015   right 2nd toe   TONSILLECTOMY  age 57   TRANSOBTURATOR SLING  01-08-2004   dr watt @[facility]      Social History:   reports  that she has never smoked. She has never used smokeless tobacco. She reports that she does not drink alcohol  and does not use drugs.   Family History:  Her family history includes Arrhythmia in her father; COPD in her father; Cancer in her father; Diabetes in her mother and sister; Migraines in her mother.   Allergies Allergies[1]   Home Medications  Prior to Admission medications  Medication Sig Start Date End Date Taking? Authorizing Provider  ALPRAZolam  (XANAX ) 1 MG tablet Take 1 tablet (1 mg total) by mouth 3 (three) times daily as needed. 01/03/22     ALPRAZolam  (XANAX ) 1 MG tablet Take 1 tablet (1 mg total) by mouth  3 (three) times daily as needed. 09/27/22     ALPRAZolam  (XANAX ) 1 MG tablet Take 1 tablet (1 mg total) by mouth 3 (three) times daily as needed. 07/26/23     Blood Pressure Monitoring (OMRON 3 SERIES BP MONITOR) DEVI Use as directed 12/29/22   Arloa Elsie SAUNDERS, MD  chlorthalidone  (HYGROTON ) 25 MG tablet Take 1 tablet (25 mg total) by mouth daily with breakfast for blood pressure. 07/26/23     cloNIDine (CATAPRES) 0.1 MG tablet Take 0.1 mg by mouth daily.    [provider]  Continuous Glucose Sensor (DEXCOM G7 SENSOR) MISC use as directed - every 10 days 08/25/22     Continuous Glucose Sensor (DEXCOM G7 SENSOR) MISC change every 10 days 08/21/23     cyclobenzaprine  (FLEXERIL ) 10 MG tablet Take 1 tablet (10 mg total) by mouth 3 (three) times daily as needed for muscle spasms. 09/08/22   Gershon Donnice SAUNDERS, DPM  diclofenac (VOLTAREN) 50 MG EC tablet Take 50 mg by mouth 2 (two) times daily as needed.    [provider]  DULoxetine  (CYMBALTA ) 60 MG capsule Take 1 capsule (60 mg total) by mouth 2 (two) times daily. 01/03/22     fenofibrate  micronized (LOFIBRA) 134 MG capsule Take 1 capsule (134 mg total) by mouth daily. 01/03/22     fluconazole  (DIFLUCAN ) 150 MG tablet Take 1 tablet (150 mg total) by mouth daily. May repeat for 1 time Patient taking differently: Take  150 mg by mouth as needed. 10/31/22     folic acid  (FOLVITE ) 1 MG tablet Take 2 tablets (2 mg total) by mouth daily. 07/12/22     gabapentin  (NEURONTIN ) 400 MG capsule Take 1 capsule (400 mg total) by mouth 3 (three) times daily. 08/25/22     hydrochlorothiazide  (HYDRODIURIL ) 25 MG tablet Take 1 tablet (25 mg total) by mouth daily. Patient taking differently: Take 25 mg by mouth daily. Every othe day 08/25/22     hydroxychloroquine  (PLAQUENIL ) 200 MG tablet Take 2 tablets (400 mg total) by mouth once daily with food or milk. 09/04/23     insulin  lispro (HUMALOG  KWIKPEN) 100 UNIT/ML KwikPen Inject 10-15 Units into the skin 3 (three) times daily. 04/20/23     insulin  lispro (HUMALOG  KWIKPEN) 100 UNIT/ML KwikPen Inject 10-15 Units into the skin 3 (three) times daily. 04/27/23     insulin  lispro (HUMALOG  KWIKPEN) 100 UNIT/ML KwikPen Inject 10-15 Units into the skin 3 (three) times daily for 30 days. 08/21/23     insulin  lispro (HUMALOG ) 100 UNIT/ML injection Inject 6-15 Units into the skin 3 (three) times daily with meals. 6-15 units sliding scale AS NEEDED    [provider]  ketorolac  (ACULAR ) 0.5 % ophthalmic solution Place 1 drop into the right eye 4 (four) times daily. 10/08/24   [provider]  leflunomide  (ARAVA ) 20 MG tablet Take 20 mg by mouth daily.    [provider]  levocetirizine (XYZAL ) 5 MG tablet Take 1 tablet (5 mg total) by mouth every evening. 01/18/23     levothyroxine  (SYNTHROID ) 50 MCG tablet Take 1 tablet (50 mcg total) by mouth in the morning on an empty stomach 08/25/22     lidocaine  (LIDODERM ) 5 % Place 1 patch onto the skin once daily, remove after 12 (twelve) hours. 07/05/21     metFORMIN  (GLUCOPHAGE -XR) 500 MG 24 hr tablet Take 2 tablets (1,000 mg total) by mouth 2 (two) times daily. 08/25/22     metFORMIN  (GLUCOPHAGE -XR) 500 MG 24 hr tablet  Take 2 tablets (1,000 mg total) by mouth 2 (two) times daily. 08/21/23     metoprolol  succinate (TOPROL -XL) 25 MG  24 hr tablet Take 25 mg by mouth daily.    [provider]  montelukast  (SINGULAIR ) 10 MG tablet Take 1 tablet (10 mg total) by mouth every evening. 01/18/23     montelukast  (SINGULAIR ) 10 MG tablet Take 1 tablet (10 mg total) by mouth every evening. 07/26/23     omeprazole  (PRILOSEC) 40 MG capsule Take 1 capsule (40 mg total) by mouth 2 (two) times daily. 01/18/23     tirzepatide  (MOUNJARO ) 12.5 MG/0.5ML Pen Inject 12.5 mg into the skin once a week. 08/25/22     traMADol  (ULTRAM ) 50 MG tablet Take 1 tablet (50 mg total) by mouth 3 (three) times daily as needed. 11/18/24   Debby Fidela CROME, NP  TRESIBA FLEXTOUCH 100 UNIT/ML FlexTouch Pen Inject into the skin daily.    [provider]  TUBERCULIN SYR 1CC/27GX1/2 27G X 1/2 1 ML MISC Inject into the skin once a week with methotrexate  07/12/22   Mai Lynwood FALCON, MD  UNIFINE PENTIPS 31G X 5 MM MISC USE AS DIRECTED 5 TIMES PER DAY 06/19/18   [provider]  valACYclovir  (VALTREX ) 1000 MG tablet Take 2 tablets (2,000 mg total) by mouth 2 (two) times daily for 1 day when needed. 11/29/23     valsartan  (DIOVAN ) 320 MG tablet Take 1 tablet (320 mg total) by mouth daily. 07/26/23     zolpidem  (AMBIEN  CR) 12.5 MG CR tablet Take 1 tablet (12.5 mg total) by mouth at bedtime as needed. Patient taking differently: Take 12.5 mg by mouth at bedtime. 01/18/23        Critical care time:               [1]  Allergies Allergen Reactions   Dilaudid [Hydromorphone Hcl] Nausea And Vomiting    Hard to wake up   Bupropion     Other reaction(s): possibly   Doxycycline Nausea And Vomiting    Other Reaction(s): GI Intolerance   Hydromorphone     Other reaction(s): Unknown   Topiramate Other (See Comments)    Suicidal thoughts per patient   Trazodone And Nefazodone Other (See Comments)    Hallucinations/ suicidal thoughts   "

## 2024-11-27 NOTE — ED Provider Notes (Signed)
 " [place] EMERGENCY DEPARTMENT AT [facility] Provider Note   CSN: [id number] Arrival date & time: 11/27/24  8177     Patient presents with: Code Stroke   Amy Adams is a 59 y.o. female.  Has history of CKD, migraines, rheumatoid arthritis, diabetes.  Presents the ER today complaining of severe sudden onset headache.  This started just prior to arrival.  She comes in via EMS.  She states she has had a headache behind her right eye for the past 3 days.  States she has been having aches for the past 6 weeks, her blood pressure has been elevated and her doctor has been adjusting medicine to try to get under control.  Today her headache acutely worsened around 7 PM and with severe sudden pain, nausea and pain rating down her neck that felt like muscle spasms.  She states she had tingling in all of her arms and legs.  She called EMS due to the severity of pain.  EMS states that she was able to walk to the stretcher, blood sugar was mildly elevated, blood pressure was 180s systolic.  They report that about 5 minutes out from the hospital she started having mild left facial droop.   HPI     Prior to Admission medications  Medication Sig Start Date End Date Taking? Authorizing Provider  ALPRAZolam  (XANAX ) 1 MG tablet Take 1 tablet (1 mg total) by mouth 3 (three) times daily as needed. 01/03/22     ALPRAZolam  (XANAX ) 1 MG tablet Take 1 tablet (1 mg total) by mouth 3 (three) times daily as needed. 09/27/22     ALPRAZolam  (XANAX ) 1 MG tablet Take 1 tablet (1 mg total) by mouth 3 (three) times daily as needed. 07/26/23     Blood Pressure Monitoring (OMRON 3 SERIES BP MONITOR) DEVI Use as directed 12/29/22   Arloa Elsie SAUNDERS, MD  chlorthalidone  (HYGROTON ) 25 MG tablet Take 1 tablet (25 mg total) by mouth daily with breakfast for blood pressure. 07/26/23     cloNIDine (CATAPRES) 0.1 MG tablet Take 0.1 mg by mouth daily.    [provider]  Continuous Glucose Sensor (DEXCOM G7  SENSOR) MISC use as directed - every 10 days 08/25/22     Continuous Glucose Sensor (DEXCOM G7 SENSOR) MISC change every 10 days 08/21/23     cyclobenzaprine  (FLEXERIL ) 10 MG tablet Take 1 tablet (10 mg total) by mouth 3 (three) times daily as needed for muscle spasms. 09/08/22   Gershon Donnice SAUNDERS, DPM  diclofenac (VOLTAREN) 50 MG EC tablet Take 50 mg by mouth 2 (two) times daily as needed.    [provider]  DULoxetine  (CYMBALTA ) 60 MG capsule Take 1 capsule (60 mg total) by mouth 2 (two) times daily. 01/03/22     fenofibrate  micronized (LOFIBRA) 134 MG capsule Take 1 capsule (134 mg total) by mouth daily. 01/03/22     fluconazole  (DIFLUCAN ) 150 MG tablet Take 1 tablet (150 mg total) by mouth daily. May repeat for 1 time Patient taking differently: Take 150 mg by mouth as needed. 10/31/22     folic acid  (FOLVITE ) 1 MG tablet Take 2 tablets (2 mg total) by mouth daily. 07/12/22     gabapentin  (NEURONTIN ) 400 MG capsule Take 1 capsule (400 mg total) by mouth 3 (three) times daily. 08/25/22     hydrochlorothiazide  (HYDRODIURIL ) 25 MG tablet Take 1 tablet (25 mg total) by mouth daily. Patient taking differently: Take 25 mg by mouth daily. Every othe  day 08/25/22     hydroxychloroquine  (PLAQUENIL ) 200 MG tablet Take 2 tablets (400 mg total) by mouth once daily with food or milk. 09/04/23     insulin  lispro (HUMALOG  KWIKPEN) 100 UNIT/ML KwikPen Inject 10-15 Units into the skin 3 (three) times daily. 04/20/23     insulin  lispro (HUMALOG  KWIKPEN) 100 UNIT/ML KwikPen Inject 10-15 Units into the skin 3 (three) times daily. 04/27/23     insulin  lispro (HUMALOG  KWIKPEN) 100 UNIT/ML KwikPen Inject 10-15 Units into the skin 3 (three) times daily for 30 days. 08/21/23     insulin  lispro (HUMALOG ) 100 UNIT/ML injection Inject 6-15 Units into the skin 3 (three) times daily with meals. 6-15 units sliding scale AS NEEDED    [provider]  ketorolac  (ACULAR ) 0.5 % ophthalmic solution Place 1 drop into  the right eye 4 (four) times daily. 10/08/24   [provider]  leflunomide  (ARAVA ) 20 MG tablet Take 20 mg by mouth daily.    [provider]  levocetirizine (XYZAL ) 5 MG tablet Take 1 tablet (5 mg total) by mouth every evening. 01/18/23     levothyroxine  (SYNTHROID ) 50 MCG tablet Take 1 tablet (50 mcg total) by mouth in the morning on an empty stomach 08/25/22     lidocaine  (LIDODERM ) 5 % Place 1 patch onto the skin once daily, remove after 12 (twelve) hours. 07/05/21     metFORMIN  (GLUCOPHAGE -XR) 500 MG 24 hr tablet Take 2 tablets (1,000 mg total) by mouth 2 (two) times daily. 08/25/22     metFORMIN  (GLUCOPHAGE -XR) 500 MG 24 hr tablet Take 2 tablets (1,000 mg total) by mouth 2 (two) times daily. 08/21/23     metoprolol  succinate (TOPROL -XL) 25 MG 24 hr tablet Take 25 mg by mouth daily.    [provider]  montelukast  (SINGULAIR ) 10 MG tablet Take 1 tablet (10 mg total) by mouth every evening. 01/18/23     montelukast  (SINGULAIR ) 10 MG tablet Take 1 tablet (10 mg total) by mouth every evening. 07/26/23     omeprazole  (PRILOSEC) 40 MG capsule Take 1 capsule (40 mg total) by mouth 2 (two) times daily. 01/18/23     tirzepatide  (MOUNJARO ) 12.5 MG/0.5ML Pen Inject 12.5 mg into the skin once a week. 08/25/22     traMADol  (ULTRAM ) 50 MG tablet Take 1 tablet (50 mg total) by mouth 3 (three) times daily as needed. 11/18/24   Debby Fidela CROME, NP  TRESIBA FLEXTOUCH 100 UNIT/ML FlexTouch Pen Inject into the skin daily.    [provider]  TUBERCULIN SYR 1CC/27GX1/2 27G X 1/2 1 ML MISC Inject into the skin once a week with methotrexate  07/12/22   Mai Lynwood FALCON, MD  UNIFINE PENTIPS 31G X 5 MM MISC USE AS DIRECTED 5 TIMES PER DAY 06/19/18   [provider]  valACYclovir  (VALTREX ) 1000 MG tablet Take 2 tablets (2,000 mg total) by mouth 2 (two) times daily for 1 day when needed. 11/29/23     valsartan  (DIOVAN ) 320 MG tablet Take 1 tablet (320 mg total) by mouth daily. 07/26/23      zolpidem  (AMBIEN  CR) 12.5 MG CR tablet Take 1 tablet (12.5 mg total) by mouth at bedtime as needed. Patient taking differently: Take 12.5 mg by mouth at bedtime. 01/18/23       Allergies: Dilaudid [hydromorphone hcl], Bupropion, Doxycycline, Hydromorphone, Topiramate, and Trazodone and nefazodone    Review of Systems  Updated Vital Signs BP (!) 143/104   Pulse 88   Temp (!) 97.5 F (  36.4 C) (Oral)   Resp 15   Ht 5' 8 (1.727 m)   Wt 98 kg   SpO2 97%   BMI 32.84 kg/m   Physical Exam Vitals and nursing note reviewed.  Constitutional:      General: She is in acute distress.     Appearance: She is well-developed.     Comments: Patient is in obvious pain, holding the right side of her head  HENT:     Head: Normocephalic and atraumatic.     Nose: Nose normal.     Mouth/Throat:     Mouth: Mucous membranes are moist.  Eyes:     Extraocular Movements: Extraocular movements intact.     Conjunctiva/sclera: Conjunctivae normal.     Pupils: Pupils are equal, round, and reactive to light.  Cardiovascular:     Rate and Rhythm: Normal rate and regular rhythm.     Heart sounds: No murmur heard. Pulmonary:     Effort: Pulmonary effort is normal. No respiratory distress.     Breath sounds: Normal breath sounds.  Abdominal:     Palpations: Abdomen is soft.     Tenderness: There is no abdominal tenderness.  Musculoskeletal:        General: No swelling.     Cervical back: Neck supple.  Skin:    General: Skin is warm and dry.     Capillary Refill: Capillary refill takes less than 2 seconds.  Neurological:     Mental Status: She is alert and oriented to person, place, and time.     GCS: GCS eye subscore is 4. GCS verbal subscore is 5. GCS motor subscore is 6.     Motor: No weakness.     Comments: Patient has a left facial droop, no drift in arms or legs  Psychiatric:        Mood and Affect: Mood normal.     (all labs ordered are listed, but only abnormal results are  displayed) Labs Reviewed  APTT - Abnormal; Notable for the following components:      Result Value   aPTT 23 (*)    All other components within normal limits  CBC - Abnormal; Notable for the following components:   RBC 5.12 (*)    All other components within normal limits  COMPREHENSIVE METABOLIC PANEL WITH GFR - Abnormal; Notable for the following components:   Potassium 3.4 (*)    CO2 20 (*)    Glucose, Bld 219 (*)    Creatinine, Ser 1.12 (*)    GFR, Estimated 56 (*)    Anion gap 17 (*)    All other components within normal limits  GLUCOSE, CAPILLARY - Abnormal; Notable for the following components:   Glucose-Capillary 228 (*)    All other components within normal limits  PROTIME-INR  DIFFERENTIAL  ETHANOL  URINE DRUG SCREEN  I-STAT CHEM 8, ED    EKG: None  Radiology: CT ANGIO HEAD NECK W WO CM (CODE STROKE) Result Date: 11/27/2024 CLINICAL DATA:  Initial evaluation for acute neuro deficit, stroke. No other relevant history is provided. EXAM: CT ANGIOGRAPHY HEAD AND NECK WITH AND WITHOUT CONTRAST TECHNIQUE: Multidetector CT imaging of the head and neck was performed using the standard protocol during bolus administration of intravenous contrast. Multiplanar CT image reconstructions and MIPs were obtained to evaluate the vascular anatomy. Carotid stenosis measurements (when applicable) are obtained utilizing NASCET criteria, using the distal internal carotid diameter as the denominator. RADIATION DOSE REDUCTION: This exam was performed according to  the departmental dose-optimization program which includes automated exposure control, adjustment of the mA and/or kV according to patient size and/or use of iterative reconstruction technique. CONTRAST:  75mL OMNIPAQUE  IOHEXOL  350 MG/ML SOLN COMPARISON:  CT from earlier the same day FINDINGS: CTA NECK FINDINGS Aortic arch: Standard branching. Imaged portion shows no evidence of aneurysm or dissection. No significant stenosis of the major  arch vessel origins. Right carotid system: No evidence of dissection, stenosis (50% or greater), or occlusion. Left carotid system: No evidence of dissection, stenosis (50% or greater), or occlusion. Vertebral arteries: Codominant. No evidence of dissection, stenosis (50% or greater), or occlusion. Skeleton: No discrete or worrisome osseous lesions. Other neck: No other acute finding. Multiple scattered thyroid  nodules, largest of which measures up to 2.1 cm on the right. These have been previously evaluated by thyroid  ultrasound on 08/22/2022. Upper chest: No other acute finding. Review of the MIP images confirms the above findings CTA HEAD FINDINGS Anterior circulation: Both internal carotid arteries widely patent through the siphons without stenosis. A1 segments patent bilaterally. Normal anterior communicating complex. Anterior cerebral arteries patent without significant stenosis. No M1 stenosis or occlusion. No proximal MCA branch occlusion. Distal MCA branches perfused and symmetric. Posterior circulation: Both V4 segments patent without significant stenosis. Both PICA patent at their origins. Basilar patent without significant stenosis. Superior cerebral arteries patent bilaterally. Left PCA supplied via the basilar. Right PCA supplied via a hypoplastic right P1 segment and a small right posterior communicating. PCAs patent to their distal aspects without stenosis. Venous sinuses: Patent allowing for timing the contrast bolus. Anatomic variants: As above. No intracranial aneurysm or other vascular malformation. No significant vasospasm by CTA. Review of the MIP images confirms the above findings IMPRESSION: Negative CTA of the head and neck. No large vessel occlusion, hemodynamically significant stenosis, or other acute vascular abnormality. No intracranial aneurysm. These results were communicated to Dr. Jerri at 7:39 pm on 11/27/2024 by text page via the [facility] messaging system. Electronically Signed   By:  Morene Hoard M.D.   On: 11/27/2024 19:44   CT HEAD CODE STROKE WO CONTRAST (LKW 0-4.5h, LVO 0-24h) Result Date: 11/27/2024 EXAM: CT HEAD WITHOUT CONTRAST 11/27/2024 06:44:30 PM TECHNIQUE: CT of the head was performed without the administration of intravenous contrast. Automated exposure control, iterative reconstruction, and/or weight based adjustment of the mA/kV was utilized to reduce the radiation dose to as low as reasonably achievable. COMPARISON: None available. CLINICAL HISTORY: Neuro deficit, acute, stroke suspected. FINDINGS: BRAIN AND VENTRICLES: Large volume subarachnoid hemorrhage in the basilar cisterns. Hemorrhage fills and slightly expands the prepontine cistern as well as blood within the suprasellar cistern. There are areas of hemorrhage involving the interpeduncular cistern, ambient cisterns, and quadrigeminal cisterns. Hemorrhage extends over the superior aspect of the cerebellar vermis. Few additional areas of subarachnoid hemorrhage posteriorly along the cerebellum. There is streak artifact at the anterior skull base without definite hemorrhage in this region. Mild hydrocephalus. No evidence of acute infarct. No significant edema. No mass effect or midline shift. Recommend CTA for further evaluation of aneurysm. ORBITS: No acute abnormality. SINUSES: No acute abnormality. SOFT TISSUES AND SKULL: No acute soft tissue abnormality. No skull fracture. IMPRESSION: 1. Large volume subarachnoid hemorrhage centered in the basilar cisterns. 2. Mild hydrocephalus. 3. Recommend CTA for further evaluation for aneurysm. 4. Findings messaged to Dr. Jerri via the [facility] messaging systemt at 7:02 PM on 11/27/24. Electronically signed by: Donnice Mania MD 11/27/2024 07:04 PM EST RP Workstation: [id number]     .Critical  Care  Performed by: Suellen Sherran LABOR, PA-C Authorized by: Suellen Sherran LABOR, PA-C   Critical care provider statement:    Critical care time (minutes):  45   Critical care time was  exclusive of:  Separately billable procedures and treating other patients and teaching time   Critical care was necessary to treat or prevent imminent or life-threatening deterioration of the following conditions: subarachnoid hemorrhage.   Critical care was time spent personally by me on the following activities:  Development of treatment plan with patient or surrogate, discussions with consultants, evaluation of patient's response to treatment, examination of patient, ordering and review of laboratory studies, ordering and review of radiographic studies, ordering and performing treatments and interventions, pulse oximetry, re-evaluation of patient's condition, review of old charts and obtaining history from patient or surrogate   Care discussed with comment:  Dr. Lester, Dr. Jerri, Dr. Layman    Medications Ordered in the ED  ondansetron  (ZOFRAN ) injection 4 mg (4 mg Intravenous Not Given 11/27/24 1851)  labetalol  (NORMODYNE ) injection 20 mg (20 mg Intravenous Given 11/27/24 1859)    Followed by  clevidipine  (CLEVIPREX ) infusion 0.5 mg/mL (21 mg/hr Intravenous Infusion Verify 11/27/24 2224)  fentaNYL  (SUBLIMAZE ) injection 50 mcg (50 mcg Intravenous Given 11/27/24 1933)  levETIRAcetam  (KEPPRA ) undiluted injection 1,000 mg (1,000 mg Intravenous Given 11/27/24 2002)  iohexol  (OMNIPAQUE ) 350 MG/ML injection 75 mL (75 mLs Intravenous Contrast Given 11/27/24 1921)  acetaminophen  (TYLENOL ) tablet 1,000 mg (1,000 mg Oral Given 11/27/24 2053)  labetalol  (NORMODYNE ) injection 20 mg (20 mg Intravenous Given 11/27/24 2121)  prochlorperazine  (COMPAZINE ) injection 5 mg (5 mg Intravenous Given 11/27/24 2146)  diphenhydrAMINE  (BENADRYL ) injection 12.5 mg (12.5 mg Intravenous Given 11/27/24 2143)    Clinical Course as of 11/27/24 2321  Wed Nov 27, 2024  1910 Patient was seen in the hallway on EMS stretcher due to possible code stroke, she was having left facial droop with severe sudden onset headache, stroke alert was  called and patient transported immediately to CT.  Imaging does show subarachnoid hemorrhage.  Blood pressure elevated, patient was already being seen by the teleneurologist who was put in orders for blood pressure control, Keppra  also ordered for seizure prophylaxis.  I consulted the neuro interventionalist Dr. Lester to call back, he is going to look at her images and arrange for transport to [facility], ICU. [CB]  1935 Dr. Jerri, neurologist reviewed pts CTA, no anuerysm, he recommended CCM admit at [facility] I re evaluated the patient at this time, GSC remains 15, no focal weakness [CB]  2030 Patient is still awake alert and oriented, protecting airway.  She did confirm that she was full code. [CB]  2209 Blood pressure has now stabilized. [CB]    Clinical Course User Index [CB] Suellen Sherran LABOR, PA-C                                 Medical Decision Making This patient presents to the ED for concern of severe headache with left facial droop, this involves an extensive number of treatment options, and is a complaint that carries with it a high risk of complications and morbidity.  The differential diagnosis includes ischemic stroke, hemorrhagic stroke, ruptured aneurysm, migraine, other   Co morbidities that complicate the patient evaluation :   Hypertension   Additional history obtained:  Additional history obtained from EMR External records from outside source obtained and reviewed including prior notes and labs  Lab Tests:  I Ordered, and personally interpreted labs.  The pertinent results include: CBC with no leukocytosis or anemia, INR is normal, CMP with potassium 3.4, CO2 20, anion gap 17, creatinine is 1.12   Imaging Studies ordered:  I ordered imaging studies including CT head which shows large amount of sub arachnoid blood I independently visualized and interpreted imaging within scope of identifying emergent findings  I agree with the radiologist  interpretation   Cardiac Monitoring: / EKG:  The patient was maintained on a cardiac monitor.  I personally viewed and interpreted the cardiac monitored which showed an underlying rhythm of: sinus rhythm   Consultations Obtained:  I requested consultation with the neurologist, Dr. Jerri,  and discussed lab and imaging findings as well as pertinent plan - they recommend: ICU admission  Dr. Layman, critical care, who accepts patient for management of subarachnoid hemorrhage without aneurysm   Problem List / ED Course / Critical interventions / Medication management  Arachnoid hemorrhage-patient had sudden onset headache and neck pain today, comes in the ER with a left facial droop and severe headache, stroke alert called, emergent CT of her head shows a large amount of subarachnoid blood.  He is not on blood thinners.  She does have blood pressure that has been difficult to control.  She was seen by the telemetry neurologist who put in orders for blood pressure control.  Patient initially had improvement, then blood pressure worsened and Cleviprex  was titrated accordingly.  She is having severe headaches that she can fentanyl  with some improvement.  Discussed her results.  CTA was negative for aneurysm, so neurology requested admission to medicine-critical care.  I spoke with Dr. Layman on-call, she accepts patient for ICU at [facility]. I ordered medication including fentanyl  for pain, clevidipine  for hypertension, Keppra  for seizure prophylaxis. Reevaluation of the patient after these medicines showed that the patient improved I have reviewed the patients home medicines and have made adjustments as needed       Amount and/or Complexity of Data Reviewed Labs: ordered. Radiology: ordered.  Risk OTC drugs. Prescription drug management. Decision regarding hospitalization.        Final diagnoses:  Subarachnoid hemorrhage [facility])    ED Discharge Orders     None           Suellen Sherran DELENA DEVONNA 11/27/24 2321    Francesca Elsie CROME, MD 11/27/24 2326  "

## 2024-11-27 NOTE — ED Notes (Signed)
CODE STROKE activated at this time.

## 2024-11-27 NOTE — ED Notes (Signed)
 Pt is at the max dose with Cleviprex  at this time. AT 2100 pt BP is 173/74 map 98 second BP taken 2 mins later was 170/75 map 98. This RN notified MD and PA Celeste making them aware that the pts BP was not staying under the 160 systolic as ordered. Awaiting new orders.

## 2024-11-27 NOTE — Consult Note (Signed)
 I reviewed her CT and CTA. The imaging is classic for perimesencephalic non-aneurysmal SAH, and the CTA is of good quality and negative.    I will see her once at [facility].

## 2024-11-27 NOTE — ED Notes (Addendum)
 Pt endorsed an increase in pain 8/10 in head pain and Aox4 assessment was completed and pt was AOX4.  No decrease in mentation noted at this time.

## 2024-11-28 ENCOUNTER — Inpatient Hospital Stay (HOSPITAL_COMMUNITY)

## 2024-11-28 DIAGNOSIS — N1831 Chronic kidney disease, stage 3a: Secondary | ICD-10-CM | POA: Diagnosis not present

## 2024-11-28 DIAGNOSIS — E1122 Type 2 diabetes mellitus with diabetic chronic kidney disease: Secondary | ICD-10-CM

## 2024-11-28 DIAGNOSIS — G919 Hydrocephalus, unspecified: Secondary | ICD-10-CM | POA: Diagnosis not present

## 2024-11-28 DIAGNOSIS — I608 Other nontraumatic subarachnoid hemorrhage: Secondary | ICD-10-CM

## 2024-11-28 DIAGNOSIS — I129 Hypertensive chronic kidney disease with stage 1 through stage 4 chronic kidney disease, or unspecified chronic kidney disease: Secondary | ICD-10-CM

## 2024-11-28 DIAGNOSIS — I604 Nontraumatic subarachnoid hemorrhage from basilar artery: Secondary | ICD-10-CM

## 2024-11-28 DIAGNOSIS — R008 Other abnormalities of heart beat: Secondary | ICD-10-CM

## 2024-11-28 LAB — ECHOCARDIOGRAM COMPLETE
AR max vel: 1.87 cm2
AV Area VTI: 2.15 cm2
AV Area mean vel: 1.9 cm2
AV Mean grad: 8 mmHg
AV Peak grad: 14.4 mmHg
Ao pk vel: 1.9 m/s
Area-P 1/2: 6.96 cm2
Est EF: >75
Height: 68 in
MV M vel: 1.64 m/s
MV Peak grad: 10.8 mmHg
MV VTI: 2.68 cm2
S' Lateral: 3.7 cm
Weight: 3435.65 [oz_av]

## 2024-11-28 LAB — GLUCOSE, CAPILLARY
Glucose-Capillary: 124 mg/dL — ABNORMAL HIGH (ref 70–99)
Glucose-Capillary: 124 mg/dL — ABNORMAL HIGH (ref 70–99)
Glucose-Capillary: 130 mg/dL — ABNORMAL HIGH (ref 70–99)
Glucose-Capillary: 134 mg/dL — ABNORMAL HIGH (ref 70–99)
Glucose-Capillary: 153 mg/dL — ABNORMAL HIGH (ref 70–99)
Glucose-Capillary: 194 mg/dL — ABNORMAL HIGH (ref 70–99)
Glucose-Capillary: 239 mg/dL — ABNORMAL HIGH (ref 70–99)

## 2024-11-28 LAB — BASIC METABOLIC PANEL WITH GFR
Anion gap: 15 (ref 5–15)
BUN: 18 mg/dL (ref 6–20)
CO2: 24 mmol/L (ref 22–32)
Calcium: 9.5 mg/dL (ref 8.9–10.3)
Chloride: 99 mmol/L (ref 98–111)
Creatinine, Ser: 1.09 mg/dL — ABNORMAL HIGH (ref 0.44–1.00)
GFR, Estimated: 58 mL/min — ABNORMAL LOW
Glucose, Bld: 203 mg/dL — ABNORMAL HIGH (ref 70–99)
Potassium: 3.6 mmol/L (ref 3.5–5.1)
Sodium: 137 mmol/L (ref 135–145)

## 2024-11-28 LAB — HIV ANTIBODY (ROUTINE TESTING W REFLEX): HIV Screen 4th Generation wRfx: NONREACTIVE

## 2024-11-28 LAB — CBC
HCT: 41.7 % (ref 36.0–46.0)
Hemoglobin: 14.1 g/dL (ref 12.0–15.0)
MCH: 28.4 pg (ref 26.0–34.0)
MCHC: 33.8 g/dL (ref 30.0–36.0)
MCV: 84.1 fL (ref 80.0–100.0)
Platelets: 227 K/uL (ref 150–400)
RBC: 4.96 MIL/uL (ref 3.87–5.11)
RDW: 12.9 % (ref 11.5–15.5)
WBC: 10.1 K/uL (ref 4.0–10.5)
nRBC: 0 % (ref 0.0–0.2)

## 2024-11-28 LAB — HEMOGLOBIN A1C
Hgb A1c MFr Bld: 6.7 % — ABNORMAL HIGH (ref 4.8–5.6)
Mean Plasma Glucose: 145.59 mg/dL

## 2024-11-28 LAB — MAGNESIUM: Magnesium: 1.9 mg/dL (ref 1.7–2.4)

## 2024-11-28 LAB — MRSA NEXT GEN BY PCR, NASAL: MRSA by PCR Next Gen: NOT DETECTED

## 2024-11-28 MED ORDER — LIDOCAINE 5 % EX PTCH
1.0000 | MEDICATED_PATCH | CUTANEOUS | Status: DC
  Administered 2024-11-29 – 2024-12-05 (×7): 1 via TRANSDERMAL
  Filled 2024-11-28 (×9): qty 1

## 2024-11-28 MED ORDER — LEFLUNOMIDE 10 MG PO TABS
20.0000 mg | ORAL_TABLET | Freq: Every day | ORAL | Status: DC
  Administered 2024-11-28 – 2024-12-05 (×8): 20 mg via ORAL
  Filled 2024-11-28 (×8): qty 2

## 2024-11-28 MED ORDER — MAGNESIUM SULFATE 2 GM/50ML IV SOLN
2.0000 g | Freq: Once | INTRAVENOUS | Status: AC
  Administered 2024-11-28: 2 g via INTRAVENOUS
  Filled 2024-11-28: qty 50

## 2024-11-28 MED ORDER — DULOXETINE HCL 60 MG PO CPEP
60.0000 mg | ORAL_CAPSULE | Freq: Two times a day (BID) | ORAL | Status: AC
  Administered 2024-11-28 – 2024-12-05 (×15): 60 mg via ORAL
  Filled 2024-11-28 (×15): qty 1

## 2024-11-28 MED ORDER — STROKE: EARLY STAGES OF RECOVERY BOOK
Freq: Once | Status: AC
  Filled 2024-11-28: qty 1

## 2024-11-28 MED ORDER — OXYCODONE HCL 5 MG PO TABS: 5.0000 mg | ORAL_TABLET | ORAL | Status: DC | PRN

## 2024-11-28 MED ORDER — POTASSIUM CHLORIDE CRYS ER 20 MEQ PO TBCR
40.0000 meq | EXTENDED_RELEASE_TABLET | Freq: Once | ORAL | Status: AC
  Administered 2024-11-28: 40 meq via ORAL
  Filled 2024-11-28: qty 2

## 2024-11-28 MED ORDER — IRBESARTAN 300 MG PO TABS
300.0000 mg | ORAL_TABLET | Freq: Every day | ORAL | Status: DC
  Administered 2024-11-28 – 2024-12-05 (×8): 300 mg via ORAL
  Filled 2024-11-28 (×2): qty 1
  Filled 2024-11-28: qty 2
  Filled 2024-11-28 (×3): qty 1
  Filled 2024-11-28: qty 2
  Filled 2024-11-28: qty 1

## 2024-11-28 MED ORDER — HYDROCHLOROTHIAZIDE 25 MG PO TABS
25.0000 mg | ORAL_TABLET | Freq: Every day | ORAL | Status: DC
  Administered 2024-11-28 – 2024-12-05 (×8): 25 mg via ORAL
  Filled 2024-11-28 (×8): qty 1

## 2024-11-28 MED ORDER — ACETAMINOPHEN 325 MG PO TABS
650.0000 mg | ORAL_TABLET | Freq: Four times a day (QID) | ORAL | Status: DC | PRN
  Administered 2024-11-28 (×2): 650 mg via ORAL
  Filled 2024-11-28 (×2): qty 2

## 2024-11-28 MED ORDER — METOPROLOL TARTRATE 25 MG PO TABS
25.0000 mg | ORAL_TABLET | Freq: Two times a day (BID) | ORAL | Status: DC
  Administered 2024-11-28 – 2024-11-29 (×2): 25 mg via ORAL
  Filled 2024-11-28 (×2): qty 1

## 2024-11-28 MED ORDER — FENTANYL CITRATE (PF) 50 MCG/ML IJ SOSY
50.0000 ug | PREFILLED_SYRINGE | INTRAMUSCULAR | Status: DC | PRN
  Administered 2024-12-01 – 2024-12-05 (×6): 50 ug via INTRAVENOUS
  Filled 2024-11-28 (×6): qty 1

## 2024-11-28 MED ORDER — GABAPENTIN 400 MG PO CAPS
400.0000 mg | ORAL_CAPSULE | Freq: Three times a day (TID) | ORAL | Status: DC
  Administered 2024-11-28 – 2024-12-05 (×21): 400 mg via ORAL
  Filled 2024-11-28 (×21): qty 1

## 2024-11-28 MED ORDER — GABAPENTIN 100 MG PO CAPS
200.0000 mg | ORAL_CAPSULE | Freq: Once | ORAL | Status: AC | PRN
  Administered 2024-11-28: 200 mg via ORAL
  Filled 2024-11-28: qty 2

## 2024-11-28 MED ORDER — HYDROXYCHLOROQUINE SULFATE 200 MG PO TABS
400.0000 mg | ORAL_TABLET | Freq: Every day | ORAL | Status: DC
  Administered 2024-11-28 – 2024-12-05 (×8): 400 mg via ORAL
  Filled 2024-11-28 (×8): qty 2

## 2024-11-28 MED ORDER — TRAMADOL HCL 50 MG PO TABS
50.0000 mg | ORAL_TABLET | Freq: Four times a day (QID) | ORAL | Status: DC | PRN
  Administered 2024-11-28 – 2024-12-01 (×5): 50 mg via ORAL
  Filled 2024-11-28 (×5): qty 1

## 2024-11-28 MED ORDER — LEVOTHYROXINE SODIUM 50 MCG PO TABS
50.0000 ug | ORAL_TABLET | Freq: Every day | ORAL | Status: DC
  Administered 2024-11-28 – 2024-12-05 (×8): 50 ug via ORAL
  Filled 2024-11-28 (×8): qty 1

## 2024-11-28 MED ORDER — BUTALBITAL-APAP-CAFFEINE 50-325-40 MG PO TABS
2.0000 | ORAL_TABLET | Freq: Four times a day (QID) | ORAL | Status: DC
  Administered 2024-11-28 – 2024-12-05 (×28): 2 via ORAL
  Filled 2024-11-28 (×28): qty 2

## 2024-11-28 NOTE — Consult Note (Signed)
 I had the pleasure of speaking with Amy Adams this morning.  Yesterday, while eating lunch, she experienced a thunderclap headache.  She went to the emergency room and her CT scan shows a perimesencephalic hemorrhage.  She had a high-quality CTA which is negative.  I reviewed both studies in detail myself.  She has a history of insulin -dependent diabetes, hypertension and dyslipidemia.  She was not taking any blood thinners (no aspirin).  She has a history of gastroesophageal reflux and mild renal insufficiency.  Alert and oriented with normal speech expression, fluency and comprehension. Visual fields are full to confrontation.   Face is symmetric. Strength in the arms and legs is symmetric with no drift.  Sensation is normal and symmetric. No ataxia. No inattention.  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  I do not think she needs an arteriogram as literature indicates an extremely low incidence of any relevant finding in this situation.  I have explained to the patient that venous hemorrhages, as opposed to aneurysmal hemorrhages, have a very low incidence of complications or recurrence.  I will continue to follow during her hospitalization.  I am also glad to see her in posthospital follow-up.

## 2024-11-28 NOTE — Progress Notes (Signed)
 SPIRITUAL CARE AND COUNSELING CONSULT NOTE   VISIT SUMMARY : Chaplain met with Pt's daughter Amy Adams  and provided education regarding the advance direction (AD). Daughter shared that her mother recently experienced a stroke and that family wanted to review the AD paperwork together. Chaplain provided the AD packet prior to the daughter leaving [facility]. Daughter state she plans to return tomorrow to review the document with her brother and will contact chaplain services if question arise or additional support is needed.  SPIRITUAL ENCOUNTER                                                                                                                                                                      Type of Visit: Initial Care provided to:: Family Referral source: Nurse (RN/NT/LPN) Reason for visit: Advance directives OnCall Visit: Yes   SPIRITUAL FRAMEWORK  Presenting Themes: Impactful experiences and emotions Community/Connection: Family Patient Stress Factors: Health changes Family Stress Factors: Exhausted, Health changes, Major life changes   GOALS     Provided education regarding advance directives.  INTERVENTIONS   Spiritual Care Interventions Made: Compassionate presence, Normalization of emotions, Established relationship of care and support    INTERVENTION OUTCOMES   Outcomes: Awareness of support, Awareness of health  SPIRITUAL CARE PLAN     Family will contact chaplain services if any question or concerns arise regarding AD paperwork.   If immediate needs arise,    [name], Elia  11/28/2024 7:29 PM

## 2024-11-28 NOTE — Progress Notes (Addendum)
 "  NAME:  Amy Adams, MRN:  [id number], DOB:  01-22-1966, LOS: 1 ADMISSION DATE:  11/27/2024, CONSULTATION DATE:  11/27/24 REFERRING MD:  EDP, CHIEF COMPLAINT:  acute sah   History of Present Illness:  59 yo female presented with neck spasm, all extremity numbness, n/v and L facial droop. Pt reports having laser eye surgery 6 weeks ago for glaucoma and having continued pain behind R eye since procedure with headache. Last Thursday she had blurry vision in the R eye and seeing only white out images, she went to her eye dr and was sent home without issue.   At 1700 today she began having neck spasms that triggered upper extremity and facial numbness. She called EMS and presented to [facility] as a code stroke. At presentation she was found to have L facial droop and difficulty opening L eye. During her time there she began having N/V. She was sent to CT and found to have basilar cistern SAH with mild hydrocephalus. BP noted to be elevated with systolics in the 180-190's. She was given a single dose of labetalol  and started on cleviprex  infusion. CTA negative for aneurysm. Neurology called and recommended Neuro IR eval and CCM to admit. She was given keppra  for sz prophylaxis and sent to [facility].   Pt endorses ha continues at this time but numbness is improving. Worse on L than R but also has poorly controlled RLS at baseline, has not had her meds and her L leg is typically more painful/numb than R.   Pertinent  Medical History  CKD3a Htn Hyperlipidemia Glaucoma T2dm with hyperglycemia Arthritis Chronic low back pain RLS  Significant Hospital Events: Including procedures, antibiotic start and stop dates in addition to other pertinent events   Admitted to ICU at [facility] from [facility] 1/14  Interim History / Subjective:  Continues to have headache.  No focal deficits on exam.  Remains hypertensive requiring clevidipine .  Not totally sure about her home regiment but does know she takes HCTZ.  Sounds like  metoprolol  was recently changed to another medicine with an M.  Unclear.  Objective    Blood pressure (!) 147/79, pulse 91, temperature 98.5 F (36.9 C), temperature source Oral, resp. rate 18, height 5' 8 (1.727 m), weight 97.4 kg, SpO2 94%.        Intake/Output Summary (Last 24 hours) at 11/28/2024 0817 Last data filed at 11/28/2024 0600 Gross per 24 hour  Intake 281.96 ml  Output --  Net 281.96 ml   Filed Weights   11/27/24 1903 11/28/24 0500  Weight: 98 kg 97.4 kg    Examination: General: Lying in bed, fetal position HENT: NCAT, EOMI Lungs: Normal work of breathing Cardiovascular: Regular rate and rhythm Abdomen: Nondistended Extremities: no c/c/e Neuro: Moves all extremities and moves around in bed easily  Resolved problem list   Assessment and Plan   Subarachnoid hemorrhage: Not aneurysmal. -- SBP less than 160, currently on clevidipine , active titration of IV vasoactive infusions -- Appreciate neurointerventional consultation, repeat imaging, angiogram versus repeat CTA at their discretion  History of hypertension: -- Continue clevidipine  as above, to meet goal, resume home hydrochlorothiazide , irbesartan , metoprolol   tartrate added  Mild hydrocephalus: Related to subarachnoid. -- Repeat imaging with any neurochange or per discretion of neurointerventional colleagues  Headache, nausea vomiting: Presume related to pain from subarachnoid hemorrhage. -- Nausea, pain control, add scheduled Fioricet , oxycodone  as needed, continue fentanyl  as needed  Diabetes: Hemoglobin A1c 6.7 on admission -- Sliding scale insulin   CKD 3A: Encourage  p.o. intake, consider IV hydration if not feeling well due to headache or nausea. -- Continue ARB   Labs   CBC: Recent Labs  Lab 11/27/24 1832 11/28/24 0210  WBC 7.5 10.1  NEUTROABS 5.4  --   HGB 14.3 14.1  HCT 43.2 41.7  MCV 84.4 84.1  PLT 224 227    Basic Metabolic Panel: Recent Labs  Lab 11/27/24 1832  11/28/24 0210  NA 136 137  K 3.4* 3.6  CL 99 99  CO2 20* 24  GLUCOSE 219* 203*  BUN 17 18  CREATININE 1.12* 1.09*  CALCIUM  9.5 9.5  MG  --  1.9   GFR: Estimated Creatinine Clearance: 67 mL/min (A) (by C-G formula based on SCr of 1.09 mg/dL (H)). Recent Labs  Lab 11/27/24 1832 11/28/24 0210  WBC 7.5 10.1    Liver Function Tests: Recent Labs  Lab 11/27/24 1832  AST 26  ALT 11  ALKPHOS 79  BILITOT 0.6  PROT 7.2  ALBUMIN 4.4   No results for input(s): LIPASE, AMYLASE in the last 168 hours. No results for input(s): AMMONIA in the last 168 hours.  ABG No results found for: PHART, PCO2ART, PO2ART, HCO3, TCO2, ACIDBASEDEF, O2SAT   Coagulation Profile: Recent Labs  Lab 11/27/24 1832  INR 0.9    Cardiac Enzymes: No results for input(s): CKTOTAL, CKMB, CKMBINDEX, TROPONINI in the last 168 hours.  HbA1C: Hgb A1c MFr Bld  Date/Time Value Ref Range Status  11/28/2024 02:10 AM 6.7 (H) 4.8 - 5.6 % Final    Comment:    (NOTE) Diagnosis of Diabetes The following HbA1c ranges recommended by the American Diabetes Association (ADA) may be used as an aid in the diagnosis of diabetes mellitus.  Hemoglobin             Suggested A1C NGSP%              Diagnosis  <5.7                   Non Diabetic  5.7-6.4                Pre-Diabetic  >6.4                   Diabetic  <7.0                   Glycemic control for                       adults with diabetes.    10/22/2018 04:20 PM 6.5 (H) 4.8 - 5.6 % Final    Comment:    (NOTE) Pre diabetes:          5.7%-6.4% Diabetes:              >6.4% Glycemic control for   <7.0% adults with diabetes     CBG: Recent Labs  Lab 11/27/24 1837 11/28/24 0024 11/28/24 0312 11/28/24 0749  GLUCAP 228* 239* 194* 153*    Review of Systems:   As per HPI  Past Medical History:  She,  has a past medical history of Anxiety, Arthritis, Chronic foot pain, left, GERD (gastroesophageal reflux disease),  History of anal fissures, Hyperlipidemia, Hypertension, Hypothyroidism, Insulin  dependent type 2 diabetes mellitus (HCC), Migraines, Peripheral neuropathy, RA (rheumatoid arthritis) (HCC), SUI (stress urinary incontinence, female), and Wears glasses.   Surgical History:   Past Surgical History:  Procedure Laterality Date   ABDOMINAL HYSTERECTOMY  1990  with Right Salpingo--ophorectomy   ANAL SPHINCTEROTOMY  01-05-2006    dr lorriane @[facility]    BUNIONECTOMY  09-25-2009   dr verta @[facility]    right great toe   CARPAL TUNNEL RELEASE Bilateral 1996;  1997   CESAREAN SECTION  1988   FOOT SURGERY  12/ 2014   dr hyatt   left 2nd hammertoe repair,  left 2nd metatarsal matrixectomy, left heel endoscopic plantar fasiotomy   IR CATHETER TUBE CHANGE  01/01/2019   IR RADIOLOGIST EVAL & MGMT  11/28/2018   IR RADIOLOGIST EVAL & MGMT  11/29/2018   IR RADIOLOGIST EVAL & MGMT  12/06/2018   IR RADIOLOGIST EVAL & MGMT  12/25/2018   IR RADIOLOGIST EVAL & MGMT  01/08/2019   IR RADIOLOGIST EVAL & MGMT  01/29/2019   PANNICULECTOMY N/A 10/26/2018   Procedure: PANNICULECTOMY;  Surgeon: Gladis Cough, MD;  Location: [facility];  Service: General;  Laterality: N/A;   TENDON REPAIR  09/2015   left posterior tibial and peroneal tendon repairs   TOE AMPUTATION  01/2015   right 2nd toe   TONSILLECTOMY  age 1   TRANSOBTURATOR SLING  01-08-2004   dr watt @[facility]      Social History:   reports that she has never smoked. She has never used smokeless tobacco. She reports that she does not drink alcohol  and does not use drugs.   Family History:  Her family history includes Arrhythmia in her father; COPD in her father; Cancer in her father; Diabetes in her mother and sister; Migraines in her mother.   Allergies Allergies[1]   Home Medications  Prior to Admission medications  Medication Sig Start Date End Date Taking? Authorizing Provider  ALPRAZolam  (XANAX ) 1 MG tablet Take 1 tablet (1 mg total) by mouth 3 (three) times daily  as needed. 01/03/22     ALPRAZolam  (XANAX ) 1 MG tablet Take 1 tablet (1 mg total) by mouth 3 (three) times daily as needed. 09/27/22     ALPRAZolam  (XANAX ) 1 MG tablet Take 1 tablet (1 mg total) by mouth 3 (three) times daily as needed. 07/26/23     Blood Pressure Monitoring (OMRON 3 SERIES BP MONITOR) DEVI Use as directed 12/29/22   Arloa Elsie SAUNDERS, MD  chlorthalidone  (HYGROTON ) 25 MG tablet Take 1 tablet (25 mg total) by mouth daily with breakfast for blood pressure. 07/26/23     cloNIDine (CATAPRES) 0.1 MG tablet Take 0.1 mg by mouth daily.    [provider]  Continuous Glucose Sensor (DEXCOM G7 SENSOR) MISC use as directed - every 10 days 08/25/22     Continuous Glucose Sensor (DEXCOM G7 SENSOR) MISC change every 10 days 08/21/23     cyclobenzaprine  (FLEXERIL ) 10 MG tablet Take 1 tablet (10 mg total) by mouth 3 (three) times daily as needed for muscle spasms. 09/08/22   Gershon Cough SAUNDERS, DPM  diclofenac (VOLTAREN) 50 MG EC tablet Take 50 mg by mouth 2 (two) times daily as needed.    [provider]  DULoxetine  (CYMBALTA ) 60 MG capsule Take 1 capsule (60 mg total) by mouth 2 (two) times daily. 01/03/22     fenofibrate  micronized (LOFIBRA) 134 MG capsule Take 1 capsule (134 mg total) by mouth daily. 01/03/22     fluconazole  (DIFLUCAN ) 150 MG tablet Take 1 tablet (150 mg total) by mouth daily. May repeat for 1 time Patient taking differently: Take 150 mg by mouth as needed. 10/31/22     folic acid  (FOLVITE ) 1 MG tablet Take 2 tablets (2 mg  total) by mouth daily. 07/12/22     gabapentin  (NEURONTIN ) 400 MG capsule Take 1 capsule (400 mg total) by mouth 3 (three) times daily. 08/25/22     hydrochlorothiazide  (HYDRODIURIL ) 25 MG tablet Take 1 tablet (25 mg total) by mouth daily. Patient taking differently: Take 25 mg by mouth daily. Every othe day 08/25/22     hydroxychloroquine  (PLAQUENIL ) 200 MG tablet Take 2 tablets (400 mg total) by mouth once daily with food or milk. 09/04/23      insulin  lispro (HUMALOG  KWIKPEN) 100 UNIT/ML KwikPen Inject 10-15 Units into the skin 3 (three) times daily. 04/20/23     insulin  lispro (HUMALOG  KWIKPEN) 100 UNIT/ML KwikPen Inject 10-15 Units into the skin 3 (three) times daily. 04/27/23     insulin  lispro (HUMALOG  KWIKPEN) 100 UNIT/ML KwikPen Inject 10-15 Units into the skin 3 (three) times daily for 30 days. 08/21/23     insulin  lispro (HUMALOG ) 100 UNIT/ML injection Inject 6-15 Units into the skin 3 (three) times daily with meals. 6-15 units sliding scale AS NEEDED    [provider]  ketorolac  (ACULAR ) 0.5 % ophthalmic solution Place 1 drop into the right eye 4 (four) times daily. 10/08/24   [provider]  leflunomide  (ARAVA ) 20 MG tablet Take 20 mg by mouth daily.    [provider]  levocetirizine (XYZAL ) 5 MG tablet Take 1 tablet (5 mg total) by mouth every evening. 01/18/23     levothyroxine  (SYNTHROID ) 50 MCG tablet Take 1 tablet (50 mcg total) by mouth in the morning on an empty stomach 08/25/22     lidocaine  (LIDODERM ) 5 % Place 1 patch onto the skin once daily, remove after 12 (twelve) hours. 07/05/21     metFORMIN  (GLUCOPHAGE -XR) 500 MG 24 hr tablet Take 2 tablets (1,000 mg total) by mouth 2 (two) times daily. 08/25/22     metFORMIN  (GLUCOPHAGE -XR) 500 MG 24 hr tablet Take 2 tablets (1,000 mg total) by mouth 2 (two) times daily. 08/21/23     metoprolol  succinate (TOPROL -XL) 25 MG 24 hr tablet Take 25 mg by mouth daily.    [provider]  montelukast  (SINGULAIR ) 10 MG tablet Take 1 tablet (10 mg total) by mouth every evening. 01/18/23     montelukast  (SINGULAIR ) 10 MG tablet Take 1 tablet (10 mg total) by mouth every evening. 07/26/23     omeprazole  (PRILOSEC) 40 MG capsule Take 1 capsule (40 mg total) by mouth 2 (two) times daily. 01/18/23     tirzepatide  (MOUNJARO ) 12.5 MG/0.5ML Pen Inject 12.5 mg into the skin once a week. 08/25/22     traMADol  (ULTRAM ) 50 MG tablet Take 1 tablet (50 mg total) by mouth 3  (three) times daily as needed. 11/18/24   Debby Fidela CROME, NP  TRESIBA FLEXTOUCH 100 UNIT/ML FlexTouch Pen Inject into the skin daily.    [provider]  TUBERCULIN SYR 1CC/27GX1/2 27G X 1/2 1 ML MISC Inject into the skin once a week with methotrexate  07/12/22   Mai Lynwood FALCON, MD  UNIFINE PENTIPS 31G X 5 MM MISC USE AS DIRECTED 5 TIMES PER DAY 06/19/18   [provider]  valACYclovir  (VALTREX ) 1000 MG tablet Take 2 tablets (2,000 mg total) by mouth 2 (two) times daily for 1 day when needed. 11/29/23     valsartan  (DIOVAN ) 320 MG tablet Take 1 tablet (320 mg total) by mouth daily. 07/26/23     zolpidem  (AMBIEN  CR) 12.5 MG CR tablet Take 1 tablet (12.5 mg total) by mouth  at bedtime as needed. Patient taking differently: Take 12.5 mg by mouth at bedtime. 01/18/23        Critical care time:      CRITICAL CARE Performed by: Donnice SAUNDERS Husain Costabile   Total critical care time: 33 minutes  Critical care time was exclusive of separately billable procedures and treating other patients.  Critical care was necessary to treat or prevent imminent or life-threatening deterioration.  Critical care was time spent personally by me on the following activities: development of treatment plan with patient and/or surrogate as well as nursing, discussions with consultants, evaluation of patient's response to treatment, examination of patient, obtaining history from patient or surrogate, ordering and performing treatments and interventions, ordering and review of laboratory studies, ordering and review of radiographic studies, pulse oximetry and re-evaluation of patient's condition.  Donnice SAUNDERS Beals, MD See TRACEY           [1]  Allergies Allergen Reactions   Dilaudid [Hydromorphone Hcl] Nausea And Vomiting    Hard to wake up   Bupropion     Other reaction(s): possibly   Doxycycline Nausea And Vomiting    Other Reaction(s): GI Intolerance   Hydromorphone     Other reaction(s):  Unknown   Topiramate Other (See Comments)    Suicidal thoughts per patient   Trazodone And Nefazodone Other (See Comments)    Hallucinations/ suicidal thoughts   "

## 2024-11-28 NOTE — Plan of Care (Signed)
" °  Problem: Health Behavior/Discharge Planning: Goal: Ability to manage health-related needs will improve 11/28/2024 1713 by Orlando Aleck ORN, RN Outcome: Progressing 11/28/2024 0719 by Orlando Aleck ORN, RN Outcome: Progressing   Problem: Clinical Measurements: Goal: Ability to maintain clinical measurements within normal limits will improve 11/28/2024 1713 by Orlando Aleck ORN, RN Outcome: Progressing 11/28/2024 0719 by Orlando Aleck ORN, RN Outcome: Progressing   Problem: Clinical Measurements: Goal: Will remain free from infection 11/28/2024 1713 by Orlando Aleck ORN, RN Outcome: Progressing 11/28/2024 0719 by Orlando Aleck ORN, RN Outcome: Progressing   Problem: Clinical Measurements: Goal: Diagnostic test results will improve 11/28/2024 1713 by Orlando Aleck ORN, RN Outcome: Progressing 11/28/2024 0719 by Orlando Aleck ORN, RN Outcome: Progressing   Problem: Clinical Measurements: Goal: Respiratory complications will improve 11/28/2024 1713 by Orlando Aleck ORN, RN Outcome: Progressing 11/28/2024 0719 by Orlando Aleck ORN, RN Outcome: Progressing   Problem: Clinical Measurements: Goal: Cardiovascular complication will be avoided 11/28/2024 1713 by Orlando Aleck ORN, RN Outcome: Progressing 11/28/2024 0719 by Orlando Aleck ORN, RN Outcome: Progressing   Problem: Pain Managment: Goal: General experience of comfort will improve and/or be controlled 11/28/2024 1713 by Orlando Aleck ORN, RN Outcome: Progressing 11/28/2024 0719 by Orlando Aleck ORN, RN Outcome: Progressing   Problem: Education: Goal: Ability to describe self-care measures that may prevent or decrease complications (Diabetes Survival Skills Education) will improve 11/28/2024 1713 by Orlando Aleck ORN, RN Outcome: Progressing 11/28/2024 0719 by Orlando Aleck ORN, RN Outcome: Progressing   Problem: Coping: Goal: Ability to adjust to condition or change in health  will improve 11/28/2024 1713 by Orlando Aleck ORN, RN Outcome: Progressing 11/28/2024 0719 by Orlando Aleck ORN, RN Outcome: Progressing   Problem: Tissue Perfusion: Goal: Adequacy of tissue perfusion will improve 11/28/2024 1713 by Orlando Aleck ORN, RN Outcome: Progressing 11/28/2024 0719 by Orlando Aleck ORN, RN Outcome: Progressing   "

## 2024-11-28 NOTE — Progress Notes (Signed)
 [facility] ADULT ICU REPLACEMENT PROTOCOL   The patient does apply for the [facility] Adult ICU Electrolyte Replacment Protocol based on the criteria listed below:   1.Exclusion criteria: TCTS, ECMO, Dialysis, and Myasthenia Gravis patients 2. Is GFR >/= 30 ml/min? Yes.    Patient's GFR today is 58 3. Is SCr </= 2? Yes.   Patient's SCr is 1.09 mg/dL 4. Did SCr increase >/= 0.5 in 24 hours? No. 5.Pt's weight >40kg  Yes.   6. Abnormal electrolyte(s): potassium 3.6, mag 1.9  7. Electrolytes replaced per protocol 8.  Call MD STAT for K+ </= 2.5, Phos </= 1, or Mag </= 1 Physician:  protocol  Claretta JINNY Sharps 11/28/2024 4:19 AM

## 2024-11-28 NOTE — Plan of Care (Signed)

## 2024-11-28 NOTE — TOC Initial Note (Signed)
 Transition of Care [facility]) - Initial/Assessment Note    Patient Details  Name: Amy Adams MRN: [id number] Date of Birth: 06/02/1966  Transition of Care [facility]) CM/SW Contact:    Lenni Reckner E Damonie Furney, LCSW Phone Number: 11/28/2024, 12:24 PM  Clinical Narrative:                 Patient was admitted for subarachnoid hemorrhage.  CSW met with patient and patient's mother France) at bedside. Patient states she lives alone in [place], but can stay with her mother or daughter at discharge if needed. Patient states she has a good support system including her daughter, mother, sister, and brother. PCP is Dr. Arloa. Patient has a walker and cane at home. Patient states she was recently referred to OPPT at the [facility] location and plans to start therapy there for bladder control.  ICM will follow for needs during this admission.    Barriers to Discharge: Continued Medical Work up   Patient Goals and CMS Choice   CMS Medicare.gov Compare Post Acute Care list provided to:: Patient Choice offered to / list presented to : Patient, Parent      Expected Discharge Plan and Services       Living arrangements for the past 2 months: Single Family Home                                      Prior Living Arrangements/Services Living arrangements for the past 2 months: Single Family Home Lives with:: Self Patient language and need for interpreter reviewed:: Yes Do you feel safe going back to the place where you live?: Yes      Need for Family Participation in Patient Care: Yes (Comment) Care giver support system in place?: Yes (comment) Current home services: DME Criminal Activity/Legal Involvement Pertinent to Current Situation/Hospitalization: No - Comment as needed  Activities of Daily Living   ADL Screening (condition at time of admission) Independently performs ADLs?: Yes (appropriate for developmental age) Is the patient deaf or have difficulty hearing?: No Does  the patient have difficulty seeing, even when wearing glasses/contacts?: No Does the patient have difficulty concentrating, remembering, or making decisions?: No  Permission Sought/Granted Permission sought to share information with : Facility Medical Sales Representative, Family Supports Permission granted to share information with : Yes, Verbal Permission Granted     Permission granted to share info w AGENCY: as needed for DC planning  Permission granted to share info w Relationship: mother - Joen     Emotional Assessment       Orientation: : Oriented to Self, Oriented to Place, Oriented to  Time, Oriented to Situation Alcohol  / Substance Use: Not Applicable Psych Involvement: No (comment)  Admission diagnosis:  Subarachnoid hemorrhage (HCC) [I60.9] Patient Active Problem List   Diagnosis Date Noted   Subarachnoid hemorrhage (HCC) 11/27/2024   Adjustment disorder with depressed mood 10/29/2024   Allergic rhinitis 10/29/2024   Anxiety disorder 10/29/2024   Bilateral sacroiliitis 10/29/2024   Chronic pain 10/29/2024   Gastro-esophageal reflux disease without esophagitis 10/29/2024   Grief reaction 10/29/2024   History of colonic polyps 10/29/2024   HSV infection 10/29/2024   Hypertensive retinopathy 10/29/2024   Insomnia 10/29/2024   Low back pain 10/29/2024   Major depressive disorder with single episode, in partial remission 10/29/2024   Chronic migraine with aura and with status migrainosus, not intractable 10/29/2024   Migraine without aura and responsive  to treatment 10/29/2024   Mild intermittent asthma 10/29/2024   Oral phase dysphagia 10/29/2024   Primary osteoarthritis involving multiple joints 10/29/2024   Recurrent major depressive episodes, moderate (HCC) 10/29/2024   Stage 1 chronic kidney disease 10/29/2024   Stage 3a chronic kidney disease (HCC) 10/29/2024   Tinnitus of both ears 10/29/2024   Vitamin D deficiency 10/29/2024   Degeneration of lumbar  intervertebral disc 03/10/2022   Generalized psoriasis 03/10/2022   Other long term (current) drug therapy 03/10/2022   Overweight 03/10/2022   Pain in limb 03/10/2022   Primary osteoarthritis 03/10/2022   Synovial cyst 03/10/2022   S/P panniculectomy 10/26/2018   Panniculitis 09/04/2018   Hypothyroidism 12/01/2010   Type 2 diabetes mellitus with diabetic polyneuropathy, with long-term current use of insulin  (HCC) 12/01/2010   DIAB W/NEURO MANIFESTS TYPE II/UNS NOT UNCNTRL 12/01/2010   OTHER AND UNSPECIFIED HYPERLIPIDEMIA 12/01/2010   OBESITY, UNSPECIFIED 12/01/2010   ESSENTIAL HYPERTENSION, BENIGN 12/01/2010   Rheumatoid arthritis (HCC) 12/01/2010   PCP:  Arloa Elsie SAUNDERS, MD Pharmacy:   [facility] [address] [place] [address] Phone: [phone] Fax: [phone]  [facility] [address], [address] [place] [address] Phone: [phone] Fax: [phone]  [id number] - [place], [place] - [address] [place] [address] Phone: [phone] Fax: [phone]     Social Drivers of Health (SDOH) Social History: SDOH Screenings   Food Insecurity: No Food Insecurity (11/28/2024)  Housing: Low Risk (11/28/2024)  Transportation Needs: No Transportation Needs (11/28/2024)  Utilities: Not At Risk (11/28/2024)  Depression (PHQ2-9): Low Risk (05/27/2024)  Tobacco Use: Low Risk (11/27/2024)   SDOH Interventions:     Readmission Risk Interventions     No data to display

## 2024-11-29 ENCOUNTER — Encounter (HOSPITAL_COMMUNITY): Payer: Self-pay | Admitting: Critical Care Medicine

## 2024-11-29 DIAGNOSIS — E039 Hypothyroidism, unspecified: Secondary | ICD-10-CM | POA: Diagnosis not present

## 2024-11-29 DIAGNOSIS — G919 Hydrocephalus, unspecified: Secondary | ICD-10-CM | POA: Diagnosis not present

## 2024-11-29 DIAGNOSIS — N1831 Chronic kidney disease, stage 3a: Secondary | ICD-10-CM | POA: Diagnosis not present

## 2024-11-29 DIAGNOSIS — E1122 Type 2 diabetes mellitus with diabetic chronic kidney disease: Secondary | ICD-10-CM | POA: Diagnosis not present

## 2024-11-29 DIAGNOSIS — Z794 Long term (current) use of insulin: Secondary | ICD-10-CM | POA: Diagnosis not present

## 2024-11-29 DIAGNOSIS — M069 Rheumatoid arthritis, unspecified: Secondary | ICD-10-CM | POA: Diagnosis not present

## 2024-11-29 DIAGNOSIS — I129 Hypertensive chronic kidney disease with stage 1 through stage 4 chronic kidney disease, or unspecified chronic kidney disease: Secondary | ICD-10-CM | POA: Diagnosis not present

## 2024-11-29 DIAGNOSIS — I609 Nontraumatic subarachnoid hemorrhage, unspecified: Secondary | ICD-10-CM | POA: Diagnosis not present

## 2024-11-29 LAB — GLUCOSE, CAPILLARY
Glucose-Capillary: 107 mg/dL — ABNORMAL HIGH (ref 70–99)
Glucose-Capillary: 128 mg/dL — ABNORMAL HIGH (ref 70–99)
Glucose-Capillary: 219 mg/dL — ABNORMAL HIGH (ref 70–99)
Glucose-Capillary: 201 mg/dL — ABNORMAL HIGH (ref 70–99)
Glucose-Capillary: 266 mg/dL — ABNORMAL HIGH (ref 70–99)

## 2024-11-29 MED ORDER — LABETALOL HCL 5 MG/ML IV SOLN
20.0000 mg | INTRAVENOUS | Status: DC | PRN
  Administered 2024-12-03: 20 mg via INTRAVENOUS
  Filled 2024-11-29 (×2): qty 4

## 2024-11-29 MED ORDER — METOPROLOL TARTRATE 50 MG PO TABS
50.0000 mg | ORAL_TABLET | Freq: Two times a day (BID) | ORAL | Status: DC
  Administered 2024-11-29 – 2024-12-05 (×12): 50 mg via ORAL
  Filled 2024-11-29 (×12): qty 1

## 2024-11-29 MED ORDER — INSULIN ASPART 100 UNIT/ML IJ SOLN
0.0000 [IU] | Freq: Three times a day (TID) | INTRAMUSCULAR | Status: DC
  Administered 2024-11-30 – 2024-12-01 (×4): 2 [IU] via SUBCUTANEOUS
  Administered 2024-12-01: 1 [IU] via SUBCUTANEOUS
  Administered 2024-12-02 (×2): 2 [IU] via SUBCUTANEOUS
  Administered 2024-12-03 (×3): 1 [IU] via SUBCUTANEOUS
  Administered 2024-12-04: 2 [IU] via SUBCUTANEOUS
  Administered 2024-12-04 – 2024-12-05 (×2): 1 [IU] via SUBCUTANEOUS
  Filled 2024-11-29: qty 1
  Filled 2024-11-29: qty 2
  Filled 2024-11-29: qty 1
  Filled 2024-11-29: qty 2
  Filled 2024-11-29: qty 1
  Filled 2024-11-29 (×2): qty 2
  Filled 2024-11-29 (×2): qty 1
  Filled 2024-11-29 (×3): qty 2
  Filled 2024-11-29: qty 1

## 2024-11-29 MED ORDER — AMLODIPINE BESYLATE 5 MG PO TABS
10.0000 mg | ORAL_TABLET | Freq: Every day | ORAL | Status: DC
  Administered 2024-11-29 – 2024-12-05 (×7): 10 mg via ORAL
  Filled 2024-11-29: qty 2
  Filled 2024-11-29: qty 1
  Filled 2024-11-29 (×5): qty 2

## 2024-11-29 MED ORDER — ENOXAPARIN SODIUM 40 MG/0.4ML IJ SOSY
40.0000 mg | PREFILLED_SYRINGE | INTRAMUSCULAR | Status: DC
  Administered 2024-11-29 – 2024-12-04 (×6): 40 mg via SUBCUTANEOUS
  Filled 2024-11-29 (×6): qty 0.4

## 2024-11-29 MED ORDER — ALPRAZOLAM 0.25 MG PO TABS
1.0000 mg | ORAL_TABLET | Freq: Three times a day (TID) | ORAL | Status: DC
  Administered 2024-11-29 – 2024-12-05 (×2): 1 mg via ORAL
  Filled 2024-11-29 (×7): qty 4
  Filled 2024-11-29: qty 2

## 2024-11-29 MED ORDER — FENTANYL CITRATE (PF) 50 MCG/ML IJ SOSY
25.0000 ug | PREFILLED_SYRINGE | Freq: Once | INTRAMUSCULAR | Status: AC
  Administered 2024-11-29: 25 ug via INTRAVENOUS
  Filled 2024-11-29: qty 1

## 2024-11-29 MED ORDER — PROCHLORPERAZINE EDISYLATE 10 MG/2ML IJ SOLN
10.0000 mg | Freq: Once | INTRAMUSCULAR | Status: AC
  Administered 2024-11-29: 10 mg via INTRAVENOUS
  Filled 2024-11-29: qty 2

## 2024-11-29 MED ORDER — INSULIN ASPART 100 UNIT/ML IJ SOLN
0.0000 [IU] | Freq: Every day | INTRAMUSCULAR | Status: DC
  Administered 2024-11-29 – 2024-11-30 (×2): 3 [IU] via SUBCUTANEOUS
  Administered 2024-12-01: 2 [IU] via SUBCUTANEOUS
  Filled 2024-11-29 (×2): qty 3
  Filled 2024-11-29: qty 2

## 2024-11-29 MED ORDER — DEXAMETHASONE SOD PHOSPHATE PF 10 MG/ML IJ SOLN
10.0000 mg | Freq: Once | INTRAMUSCULAR | Status: AC
  Administered 2024-11-29: 10 mg via INTRAVENOUS
  Filled 2024-11-29: qty 1

## 2024-11-29 NOTE — Evaluation (Signed)
 Physical Therapy Evaluation Patient Details Name: Amy Adams MRN: [id number] DOB: June 13, 1966 Today's Date: 11/29/2024  History of Present Illness  59 y.o. female presents to [facility] hospital on 11/27/2024 with headache and pain posterior to R eye since laser eye surgery 6 weeks ago. On 1/14 she then developed neck spasms and facial numbness and L droop. 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 PMH includes HTN, CKD, HLD, glaucoma, DMII, OA, chronic low back pain, RLS.  Clinical Impression  Pt presents to PT with deficits in gait, balance, cognition, vision, endurance. Pt requires physical assistance to maintain balance both in sitting and standing, with multiple leftward losses of balance despite BUE support of PT and furniture/walls. PT notes dysconjugate gaze and pt reports double vision since eye surgery. Pt was independent prior to this hospital admission and demonstrates the potential to make significant functional gains with high intensity inpatient PT services. Patient will benefit from intensive inpatient follow-up therapy, >3 hours/day.        If plan is discharge home, recommend the following: A lot of help with walking and/or transfers;A lot of help with bathing/dressing/bathroom;Assistance with cooking/housework;Assist for transportation;Help with stairs or ramp for entrance;Supervision due to cognitive status   Can travel by private vehicle        Equipment Recommendations Rolling walker (2 wheels);BSC/3in1  Recommendations for Other Services  Rehab consult    Functional Status Assessment Patient has had a recent decline in their functional status and demonstrates the ability to make significant improvements in function in a reasonable and predictable amount of time.     Precautions / Restrictions Precautions Precautions: Fall Recall of Precautions/Restrictions: Impaired Precaution/Restrictions Comments: SBP<160 Restrictions Weight Bearing  Restrictions Per Provider Order: No      Mobility  Bed Mobility Overal bed mobility: Needs Assistance Bed Mobility: Supine to Sit, Sit to Supine     Supine to sit: Min assist Sit to supine: Contact guard assist        Transfers Overall transfer level: Needs assistance Equipment used: 1 person hand held assist Transfers: Sit to/from Stand Sit to Stand: Min assist                Ambulation/Gait Ambulation/Gait assistance: Mod assist Gait Distance (Feet): 20 Feet Assistive device: 1 person hand held assist (support of walls or furniture with opposite hand) Gait Pattern/deviations: Step-to pattern, Staggering left, Staggering right Gait velocity: reduced Gait velocity interpretation: <1.31 ft/sec, indicative of household ambulator   General Gait Details: slowed step-to gait, pt with multiple instances of lateral loss of balance and twice leaning strongly to left side onto PT  Stairs            Wheelchair Mobility     Tilt Bed    Modified Rankin (Stroke Patients Only) Modified Rankin (Stroke Patients Only) Pre-Morbid Rankin Score: No symptoms Modified Rankin: Moderately severe disability     Balance Overall balance assessment: Needs assistance Sitting-balance support: No upper extremity supported, Feet supported Sitting balance-Leahy Scale: Poor Sitting balance - Comments: CGA-minA   Standing balance support: Bilateral upper extremity supported Standing balance-Leahy Scale: Poor Standing balance comment: minA                             Pertinent Vitals/Pain Pain Assessment Pain Assessment: 0-10 Pain Score: 5  Pain Location: head Pain Descriptors / Indicators: Headache Pain Intervention(s): Monitored during session    Home Living Family/patient expects to be discharged  to:: Private residence Living Arrangements: Alone Available Help at Discharge: Family;Available PRN/intermittently Type of Home: Mobile home Home Access: Stairs  to enter Entrance Stairs-Rails: None Entrance Stairs-Number of Steps: 5   Home Layout: One level Home Equipment: Cane - single Librarian, Academic (2 wheels);BSC/3in1      Prior Function Prior Level of Function : Independent/Modified Independent;Driving             Mobility Comments: ambulatory without DME       Extremity/Trunk Assessment   Upper Extremity Assessment Upper Extremity Assessment: Generalized weakness    Lower Extremity Assessment Lower Extremity Assessment: Generalized weakness    Cervical / Trunk Assessment Cervical / Trunk Assessment: Normal  Communication   Communication Communication: No apparent difficulties    Cognition Arousal: Alert Behavior During Therapy: Flat affect   PT - Cognitive impairments: Awareness, Memory, Problem solving, Safety/Judgement                         Following commands: Impaired Following commands impaired: Follows one step commands with increased time     Cueing Cueing Techniques: Verbal cues     General Comments General comments (skin integrity, edema, etc.): SBP elevated after ambulation to 172/68, RN made aware. PT notes intermittent dysconjugate gaze and L eye with poor convergence. Pt reports double vision since surgery.    Exercises     Assessment/Plan    PT Assessment Patient needs continued PT services  PT Problem List Decreased strength;Decreased activity tolerance;Decreased balance;Decreased mobility;Decreased coordination;Decreased cognition;Decreased knowledge of use of DME;Decreased knowledge of precautions       PT Treatment Interventions DME instruction;Gait training;Functional mobility training;Therapeutic activities;Therapeutic exercise;Balance training;Neuromuscular re-education;Cognitive remediation;Patient/family education    PT Goals (Current goals can be found in the Care Plan section)  Acute Rehab PT Goals Patient Stated Goal: to return to independence PT Goal  Formulation: With patient/family Time For Goal Achievement: 12/13/24 Potential to Achieve Goals: Good    Frequency Min 3X/week     Co-evaluation               AM-PAC PT 6 Clicks Mobility  Outcome Measure Help needed turning from your back to your side while in a flat bed without using bedrails?: A Little Help needed moving from lying on your back to sitting on the side of a flat bed without using bedrails?: A Little Help needed moving to and from a bed to a chair (including a wheelchair)?: A Lot Help needed standing up from a chair using your arms (e.g., wheelchair or bedside chair)?: A Lot Help needed to walk in hospital room?: A Lot Help needed climbing 3-5 steps with a railing? : Total 6 Click Score: 13    End of Session Equipment Utilized During Treatment: Gait belt Activity Tolerance: Patient limited by fatigue Patient left: in bed;with call bell/phone within reach;with bed alarm set;with family/visitor present Nurse Communication: Mobility status PT Visit Diagnosis: Other abnormalities of gait and mobility (R26.89);Difficulty in walking, not elsewhere classified (R26.2);Other symptoms and signs involving the nervous system (R29.898)    Time: 8294-8262 PT Time Calculation (min) (ACUTE ONLY): 32 min   Charges:   PT Evaluation $PT Eval Low Complexity: 1 Low   PT General Charges $$ ACUTE PT VISIT: 1 Visit         Bernardino JINNY Ruth, PT, DPT Acute Rehabilitation Office [phone]   Bernardino JINNY Ruth 11/29/2024, 5:45 PM

## 2024-11-29 NOTE — Progress Notes (Signed)
  Inpatient Rehab Admissions Coordinator :  Per therapy recommendations, patient was screened for CIR candidacy by Ottie Glazier RN MSN.  At this time patient appears to be a potential candidate for CIR. I will place a rehab consult per protocol for full assessment. Please call me with any questions.  Ottie Glazier RN MSN Admissions Coordinator [phone]

## 2024-11-29 NOTE — Progress Notes (Signed)
 "  NAME:  Amy Adams, MRN:  [id number], DOB:  February 07, 1966, LOS: 2 ADMISSION DATE:  11/27/2024, CONSULTATION DATE:  11/27/24 REFERRING MD:  EDP, CHIEF COMPLAINT:  acute sah   History of Present Illness:  59 yo female presented with neck spasm, all extremity numbness, n/v and L facial droop. Pt reports having laser eye surgery 6 weeks ago for glaucoma and having continued pain behind R eye since procedure with headache. Last Thursday she had blurry vision in the R eye and seeing only white out images, she went to her eye dr and was sent home without issue.   At 1700 today she began having neck spasms that triggered upper extremity and facial numbness. She called EMS and presented to [facility] as a code stroke. At presentation she was found to have L facial droop and difficulty opening L eye. During her time there she began having N/V. She was sent to CT and found to have basilar cistern SAH with mild hydrocephalus. BP noted to be elevated with systolics in the 180-190's. She was given a single dose of labetalol  and started on cleviprex  infusion. CTA negative for aneurysm. Neurology called and recommended Neuro IR eval and CCM to admit. She was given keppra  for sz prophylaxis and sent to [facility]   Pt endorses ha continues at this time but numbness is improving. Worse on L than R but also has poorly controlled RLS at baseline, has not had her meds and her L leg is typically more painful/numb than R.   Pertinent  Medical History  CKD3a Htn Hyperlipidemia Glaucoma T2dm with hyperglycemia Arthritis Chronic low back pain RLS  Significant Hospital Events: Including procedures, antibiotic start and stop dates in addition to other pertinent events   Admitted to ICU at [facility] from [facility] 1/14  Interim History / Subjective:  Remains on cleviprex . Complains of a posterior headache that comes forward to her R forehead.   Objective    Blood pressure (!) 155/94, pulse 89, temperature 98.6 F (37 C), temperature  source Axillary, resp. rate 18, height 5' 8 (1.727 m), weight 94.1 kg, SpO2 96%.        Intake/Output Summary (Last 24 hours) at 11/29/2024 1005 Last data filed at 11/29/2024 0900 Gross per 24 hour  Intake 216.23 ml  Output 1250 ml  Net -1033.77 ml   Filed Weights   11/27/24 1903 11/28/24 0500 11/29/24 0500  Weight: 98 kg 97.4 kg 94.1 kg    Examination: General: woman sitting up in bed in NAD HENT: [place]/AT, eyes anicteric Lungs: breathing comfortably on RA, CTAB Cardiovascular: S1S2, RRR Abdomen: obese, soft, NT Extremities: no cyanosis or edema Neuro: Moving all extremities, normal speech, speaking in paragraphs. Moving all extremities.   BUN 18 Cr 1.09 WBC 10.1 H/H 14.1/41.7 Platelets 227  Resolved problem list   Assessment and Plan   Subarachnoid hemorrhage: Not aneurysmal. Mild hydrocephalus due to subarachnoid. --goal SBP less than 160,  currently on clevidipine , active titration of IV vasoactive infusions -- Appreciate neurointerventional consultation, no need for angiogram. Will follow up OP with Dr. Lester.  -serial neuro exams   Hypertension -- Continue clevidipine  for goal SBP <160 -increase metoprolol  to 50mg  BID -con't hydrochlorothiazide , irbesartan  -adding amlodipine  10mg    Headache, nausea vomiting: Presume related to pain from subarachnoid hemorrhage. --con't fioricet  PRN -migraine cocktail- decadron , compazine , and fentanyl  -scheduled tylenol  q8h  Diabetes: Hemoglobin A1c 6.7 on admission -SSI PRN -goal BG 140-180  CKD 3A -encourage PO intake -monitor I/O  RA on chronic  immunosuppression -con't PTA plaquenil  and leflunomide    Hypothyroidism -con't PTA synthroid   At risk for deconditioning -PT, no mobility restrictions   Working coming off cleviprex , otherwise stable to transfer to progressive care. Son updated at bedside. Plan d/w Dr. Lester.  Labs   CBC: Recent Labs  Lab 11/27/24 1832 11/28/24 0210  WBC 7.5 10.1  NEUTROABS  5.4  --   HGB 14.3 14.1  HCT 43.2 41.7  MCV 84.4 84.1  PLT 224 227    Basic Metabolic Panel: Recent Labs  Lab 11/27/24 1832 11/28/24 0210  NA 136 137  K 3.4* 3.6  CL 99 99  CO2 20* 24  GLUCOSE 219* 203*  BUN 17 18  CREATININE 1.12* 1.09*  CALCIUM  9.5 9.5  MG  --  1.9   GFR: Estimated Creatinine Clearance: 65.9 mL/min (A) (by C-G formula based on SCr of 1.09 mg/dL (H)). Recent Labs  Lab 11/27/24 1832 11/28/24 0210  WBC 7.5 10.1    Liver Function Tests: Recent Labs  Lab 11/27/24 1832  AST 26  ALT 11  ALKPHOS 79  BILITOT 0.6  PROT 7.2  ALBUMIN 4.4   Critical care time:      This patient is critically ill with multiple organ system failure which requires frequent high complexity decision making, assessment, support, evaluation, and titration of therapies. This was completed through the application of advanced monitoring technologies and extensive interpretation of multiple databases. During this encounter critical care time was devoted to patient care services described in this note for 33 minutes.  Leita SHAUNNA Gaskins, DO 11/29/24 10:51 AM [facility]  For contact information, see Amion. If no response to pager, please call PCCM consult pager. After hours, 7PM- 7AM, please call Elink.       "

## 2024-11-29 NOTE — Progress Notes (Signed)
 This RN educate Pt and family on (2) visitors per visit and age appropriateness of children in ICU.

## 2024-11-29 NOTE — Progress Notes (Signed)
 Still complains of headache as expected.  She is alert and oriented with normal visual fields.  Her language is normal.  Strength and sensation are normal.  She is at low risk for spasm given the perimesencephalic hemorrhage.  When she no longer requires ICU care for her blood pressure, I think transfer to a lower level of care is appropriate.  I will continue to follow her daily and will follow-up with her as an outpatient.

## 2024-11-29 NOTE — Progress Notes (Signed)
 Pt transferred to 3W in wheelchair with family without incident. Pt verified she had all belongings and gave belongings to daughter Leotis to hold on to with consent. Pt arrived to 3W and [employer] notified. This RN helped set Pt up in bed, bed alarm on, and CB in reach.

## 2024-11-30 DIAGNOSIS — I609 Nontraumatic subarachnoid hemorrhage, unspecified: Secondary | ICD-10-CM | POA: Diagnosis not present

## 2024-11-30 LAB — GLUCOSE, CAPILLARY
Glucose-Capillary: 173 mg/dL — ABNORMAL HIGH (ref 70–99)
Glucose-Capillary: 188 mg/dL — ABNORMAL HIGH (ref 70–99)
Glucose-Capillary: 175 mg/dL — ABNORMAL HIGH (ref 70–99)
Glucose-Capillary: 268 mg/dL — ABNORMAL HIGH (ref 70–99)

## 2024-11-30 MED ORDER — INSULIN GLARGINE 100 UNIT/ML ~~LOC~~ SOLN
10.0000 [IU] | Freq: Every day | SUBCUTANEOUS | Status: DC
  Administered 2024-11-30: 10 [IU] via SUBCUTANEOUS
  Filled 2024-11-30 (×2): qty 0.1

## 2024-11-30 MED ORDER — FENOFIBRATE 54 MG PO TABS
54.0000 mg | ORAL_TABLET | Freq: Every day | ORAL | Status: DC
  Administered 2024-11-30 – 2024-12-05 (×6): 54 mg via ORAL
  Filled 2024-11-30 (×6): qty 1

## 2024-11-30 MED ORDER — ACETAMINOPHEN 325 MG PO TABS: 650.0000 mg | ORAL_TABLET | Freq: Four times a day (QID) | ORAL | Status: DC | PRN

## 2024-11-30 MED ORDER — CLONIDINE HCL 0.1 MG PO TABS
0.1000 mg | ORAL_TABLET | Freq: Two times a day (BID) | ORAL | Status: DC
  Administered 2024-11-30 – 2024-12-01 (×4): 0.1 mg via ORAL
  Filled 2024-11-30 (×4): qty 1

## 2024-11-30 MED ORDER — CYCLOBENZAPRINE HCL 10 MG PO TABS
5.0000 mg | ORAL_TABLET | Freq: Three times a day (TID) | ORAL | Status: DC | PRN
  Administered 2024-12-01 – 2024-12-04 (×6): 5 mg via ORAL
  Filled 2024-11-30 (×6): qty 1

## 2024-11-30 NOTE — Progress Notes (Signed)
 " PROGRESS NOTE    Amy Adams  FMW:[id number] DOB: Jan 01, 1966 DOA: 11/27/2024 PCP: Arloa Elsie SAUNDERS, MD    Brief Narrative:   59 year old with history of neck spasm, lower extremity numbness, nausea, left facial droop reported of having laser eye surgery 6 weeks ago for glaucoma thereafter continued right sided eye pain and headache.  There after 48 hours prior to admission started experiencing blurry vision in the right eye therefore came to the hospital.  CT of the head found to have bibasilar cisternal subarachnoid hemorrhage and elevated systolic blood pressure.  She was admitted to the ICU and started on antihypertensive drip.  CTA was negative for aneurysm.  She was also started on Keppra  for prophylaxis.  Assessment & Plan:  Subarachnoid hemorrhage - Seen by neurosurgery who recommended supportive care as they suspect this is a venous hemorrhage and low risk for recurrence. - Echocardiogram showing preserved EF - PT/OT CIR  Hypertensive emergency History of essential hypertension - Currently on Norvasc , HCTZ, ARB, Lopressor .  Recently started following outpatient with nephrology for better blood pressure control - IV as needed  Headache, nausea and vomiting - Supportive care  Diabetes mellitus type 2 Peripheral neuropathy - Sliding scale and Accu-Chek.  A1c 6.7 - Gabapentin   CKD stage IIIa - Creatinine around baseline of 1.1  Rheumatoid arthritis of chronic immunosuppressive's - Plaquenil   Hypothyroidism -Synthroid   Anxiety/depression - On Xanax , Cymbalta    DVT prophylaxis: enoxaparin  (LOVENOX ) injection 40 mg Start: 11/29/24 2200 SCDs Start: 11/27/24 2353      Code Status: Full Code Family Communication:   Continue hospital stay until cleared by neurology   PT Follow up Recs: Acute Inpatient Rehab (3hours/Day)11/29/2024 1737  Subjective: Seen at bedside.  Reporting of some pain in the backside of the head   Examination:  General exam: Appears  calm and comfortable  Respiratory system: Clear to auscultation. Respiratory effort normal. Cardiovascular system: S1 & S2 heard, RRR. No JVD, murmurs, rubs, gallops or clicks. No pedal edema. Gastrointestinal system: Abdomen is nondistended, soft and nontender. No organomegaly or masses felt. Normal bowel sounds heard. Central nervous system: Alert and oriented. No focal neurological deficits. Extremities: Symmetric 5 x 5 power. Skin: No rashes, lesions or ulcers Psychiatry: Judgement and insight appear normal. Mood & affect appropriate.                Diet Orders (From admission, onward)     Start     Ordered   11/28/24 1002  Diet Heart Room service appropriate? Yes with Assist; Fluid consistency: Thin  Diet effective now       Question Answer Comment  Room service appropriate? Yes with Assist   Fluid consistency: Thin      11/28/24 1001            Objective: Vitals:   11/30/24 0146 11/30/24 0500 11/30/24 0600 11/30/24 0833  BP: (!) 167/72  (!) 148/72 (!) 170/78  Pulse: 71  73 66  Resp:    17  Temp: 97.7 F (36.5 C)  (!) 97.5 F (36.4 C) 98.5 F (36.9 C)  TempSrc: Oral  Oral Oral  SpO2: 100%  98% 100%  Weight:  96.6 kg    Height:       No intake or output data in the 24 hours ending 11/30/24 1058 Filed Weights   11/28/24 0500 11/29/24 0500 11/30/24 0500  Weight: 97.4 kg 94.1 kg 96.6 kg    Scheduled Meds:  ALPRAZolam   1 mg Oral TID  amLODipine   10 mg Oral Daily   butalbital -acetaminophen -caffeine   2 tablet Oral Q6H   Chlorhexidine  Gluconate Cloth  6 each Topical Daily   DULoxetine   60 mg Oral BID   enoxaparin  (LOVENOX ) injection  40 mg Subcutaneous Q24H   fenofibrate   54 mg Oral Daily   gabapentin   400 mg Oral TID   hydrochlorothiazide   25 mg Oral Daily   hydroxychloroquine   400 mg Oral Daily   insulin  aspart  0-5 Units Subcutaneous QHS   insulin  aspart  0-9 Units Subcutaneous TID WC   insulin  glargine  10 Units Subcutaneous Daily   irbesartan    300 mg Oral Daily   leflunomide   20 mg Oral Daily   levothyroxine   50 mcg Oral Q0600   lidocaine   1 patch Transdermal Q24H   metoprolol  tartrate  50 mg Oral BID   Continuous Infusions:  Nutritional status     Body mass index is 32.38 kg/m.  Data Reviewed:   CBC: Recent Labs  Lab 11/27/24 1832 11/28/24 0210  WBC 7.5 10.1  NEUTROABS 5.4  --   HGB 14.3 14.1  HCT 43.2 41.7  MCV 84.4 84.1  PLT 224 227   Basic Metabolic Panel: Recent Labs  Lab 11/27/24 1832 11/28/24 0210  NA 136 137  K 3.4* 3.6  CL 99 99  CO2 20* 24  GLUCOSE 219* 203*  BUN 17 18  CREATININE 1.12* 1.09*  CALCIUM  9.5 9.5  MG  --  1.9   GFR: Estimated Creatinine Clearance: 66.7 mL/min (A) (by C-G formula based on SCr of 1.09 mg/dL (H)). Liver Function Tests: Recent Labs  Lab 11/27/24 1832  AST 26  ALT 11  ALKPHOS 79  BILITOT 0.6  PROT 7.2  ALBUMIN 4.4   No results for input(s): LIPASE, AMYLASE in the last 168 hours. No results for input(s): AMMONIA in the last 168 hours. Coagulation Profile: Recent Labs  Lab 11/27/24 1832  INR 0.9   Cardiac Enzymes: No results for input(s): CKTOTAL, CKMB, CKMBINDEX, TROPONINI in the last 168 hours. BNP (last 3 results) No results for input(s): PROBNP in the last 8760 hours. HbA1C: Recent Labs    11/28/24 0210  HGBA1C 6.7*   CBG: Recent Labs  Lab 11/29/24 0747 11/29/24 1131 11/29/24 1625 11/29/24 2125 11/30/24 0605  GLUCAP 128* 219* 201* 266* 173*   Lipid Profile: No results for input(s): CHOL, HDL, LDLCALC, TRIG, CHOLHDL, LDLDIRECT in the last 72 hours. Thyroid  Function Tests: No results for input(s): TSH, T4TOTAL, FREET4, T3FREE, THYROIDAB in the last 72 hours. Anemia Panel: No results for input(s): VITAMINB12, FOLATE, FERRITIN, TIBC, IRON, RETICCTPCT in the last 72 hours. Sepsis Labs: No results for input(s): PROCALCITON, LATICACIDVEN in the last 168 hours.  Recent Results  (from the past 240 hours)  MRSA Next Gen by PCR, Nasal     Status: None   Collection Time: 11/27/24 11:53 PM   Specimen: Nasal Mucosa; Nasal Swab  Result Value Ref Range Status   MRSA by PCR Next Gen NOT DETECTED NOT DETECTED Final    Comment: (NOTE) The GeneXpert MRSA Assay (FDA approved for NASAL specimens only), is one component of a comprehensive MRSA colonization surveillance program. It is not intended to diagnose MRSA infection nor to guide or monitor treatment for MRSA infections. Test performance is not FDA approved in patients less than 54 years old. Performed at [facility], [address] [place], [place] [address]          Radiology Studies: No results found.  LOS: 3 days   Time spent= 35 mins    Burgess JAYSON Dare, MD [facility]  If 7PM-7AM, please contact night-coverage  11/30/2024, 10:58 AM  "

## 2024-11-30 NOTE — Progress Notes (Signed)
 Inpatient Rehab Admissions:  Inpatient Rehab Consult received.  I met with patient and her mother at the bedside for rehabilitation assessment and to discuss goals and expectations of an inpatient rehab admission.  Discussed average length of stay, insurance authorization requirement and discharge home after completion of CIR. Both acknowledged understanding. However, pt prefers OP therapy after discharge. TOC made aware.  Signed: Tinnie Yvone Cohens, MS, CCC-SLP Admissions Coordinator [phone]

## 2024-11-30 NOTE — Progress Notes (Signed)
 " PROGRESS NOTE    Amy Adams  FMW:[id number] DOB: 11-18-1965 DOA: 11/27/2024 PCP: Arloa Elsie SAUNDERS, MD    Brief Narrative:   59 year old with history of neck spasm, lower extremity numbness, nausea, left facial droop reported of having laser eye surgery 6 weeks ago for glaucoma thereafter continued right sided eye pain and headache.  There after 48 hours prior to admission started experiencing blurry vision in the right eye therefore came to the hospital.  CT of the head found to have bibasilar cisternal subarachnoid hemorrhage and elevated systolic blood pressure.  She was admitted to the ICU and started on antihypertensive drip.  CTA was negative for aneurysm.  She was also started on Keppra  for prophylaxis.  Assessment & Plan:  Subarachnoid hemorrhage - Seen by neurosurgery who recommended supportive care as they suspect this is a venous hemorrhage and low risk for recurrence. - Echocardiogram showing preserved EF - PT/OT CIR  Hypertensive emergency History of essential hypertension - Currently on Norvasc , HCTZ, ARB, Lopressor .  Resume her outpatient clonidine .  Recently started following outpatient with nephrology for better blood pressure control - IV as needed  Headache, nausea and vomiting - Supportive care  Diabetes mellitus type 2 Peripheral neuropathy - Sliding scale and Accu-Chek.  A1c 6.7 - Gabapentin   CKD stage IIIa - Creatinine around baseline of 1.1  Rheumatoid arthritis of chronic immunosuppressive's - Plaquenil   Hypothyroidism -Synthroid   Anxiety/depression - On Xanax , Cymbalta    DVT prophylaxis: enoxaparin  (LOVENOX ) injection 40 mg Start: 11/29/24 2200 SCDs Start: 11/27/24 2353      Code Status: Full Code Family Communication:   Continue hospital stay until cleared by neurology   PT Follow up Recs: Acute Inpatient Rehab (3hours/Day)11/29/2024 1737  Subjective: Seen at bedside.  Reporting of some pain in the backside of the  head   Examination:  General exam: Appears calm and comfortable  Respiratory system: Clear to auscultation. Respiratory effort normal. Cardiovascular system: S1 & S2 heard, RRR. No JVD, murmurs, rubs, gallops or clicks. No pedal edema. Gastrointestinal system: Abdomen is nondistended, soft and nontender. No organomegaly or masses felt. Normal bowel sounds heard. Central nervous system: Alert and oriented. No focal neurological deficits. Extremities: Symmetric 5 x 5 power. Skin: No rashes, lesions or ulcers Psychiatry: Judgement and insight appear normal. Mood & affect appropriate.                Diet Orders (From admission, onward)     Start     Ordered   11/28/24 1002  Diet Heart Room service appropriate? Yes with Assist; Fluid consistency: Thin  Diet effective now       Question Answer Comment  Room service appropriate? Yes with Assist   Fluid consistency: Thin      11/28/24 1001            Objective: Vitals:   11/30/24 0146 11/30/24 0500 11/30/24 0600 11/30/24 0833  BP: (!) 167/72  (!) 148/72 (!) 170/78  Pulse: 71  73 66  Resp:    17  Temp: 97.7 F (36.5 C)  (!) 97.5 F (36.4 C) 98.5 F (36.9 C)  TempSrc: Oral  Oral Oral  SpO2: 100%  98% 100%  Weight:  96.6 kg    Height:       No intake or output data in the 24 hours ending 11/30/24 1101 Filed Weights   11/28/24 0500 11/29/24 0500 11/30/24 0500  Weight: 97.4 kg 94.1 kg 96.6 kg    Scheduled Meds:  ALPRAZolam   1  mg Oral TID   amLODipine   10 mg Oral Daily   butalbital -acetaminophen -caffeine   2 tablet Oral Q6H   Chlorhexidine  Gluconate Cloth  6 each Topical Daily   cloNIDine   0.1 mg Oral BID   DULoxetine   60 mg Oral BID   enoxaparin  (LOVENOX ) injection  40 mg Subcutaneous Q24H   fenofibrate   54 mg Oral Daily   gabapentin   400 mg Oral TID   hydrochlorothiazide   25 mg Oral Daily   hydroxychloroquine   400 mg Oral Daily   insulin  aspart  0-5 Units Subcutaneous QHS   insulin  aspart  0-9 Units  Subcutaneous TID WC   insulin  glargine  10 Units Subcutaneous Daily   irbesartan   300 mg Oral Daily   leflunomide   20 mg Oral Daily   levothyroxine   50 mcg Oral Q0600   lidocaine   1 patch Transdermal Q24H   metoprolol  tartrate  50 mg Oral BID   Continuous Infusions:  Nutritional status     Body mass index is 32.38 kg/m.  Data Reviewed:   CBC: Recent Labs  Lab 11/27/24 1832 11/28/24 0210  WBC 7.5 10.1  NEUTROABS 5.4  --   HGB 14.3 14.1  HCT 43.2 41.7  MCV 84.4 84.1  PLT 224 227   Basic Metabolic Panel: Recent Labs  Lab 11/27/24 1832 11/28/24 0210  NA 136 137  K 3.4* 3.6  CL 99 99  CO2 20* 24  GLUCOSE 219* 203*  BUN 17 18  CREATININE 1.12* 1.09*  CALCIUM  9.5 9.5  MG  --  1.9   GFR: Estimated Creatinine Clearance: 66.7 mL/min (A) (by C-G formula based on SCr of 1.09 mg/dL (H)). Liver Function Tests: Recent Labs  Lab 11/27/24 1832  AST 26  ALT 11  ALKPHOS 79  BILITOT 0.6  PROT 7.2  ALBUMIN 4.4   No results for input(s): LIPASE, AMYLASE in the last 168 hours. No results for input(s): AMMONIA in the last 168 hours. Coagulation Profile: Recent Labs  Lab 11/27/24 1832  INR 0.9   Cardiac Enzymes: No results for input(s): CKTOTAL, CKMB, CKMBINDEX, TROPONINI in the last 168 hours. BNP (last 3 results) No results for input(s): PROBNP in the last 8760 hours. HbA1C: Recent Labs    11/28/24 0210  HGBA1C 6.7*   CBG: Recent Labs  Lab 11/29/24 0747 11/29/24 1131 11/29/24 1625 11/29/24 2125 11/30/24 0605  GLUCAP 128* 219* 201* 266* 173*   Lipid Profile: No results for input(s): CHOL, HDL, LDLCALC, TRIG, CHOLHDL, LDLDIRECT in the last 72 hours. Thyroid  Function Tests: No results for input(s): TSH, T4TOTAL, FREET4, T3FREE, THYROIDAB in the last 72 hours. Anemia Panel: No results for input(s): VITAMINB12, FOLATE, FERRITIN, TIBC, IRON, RETICCTPCT in the last 72 hours. Sepsis Labs: No results for  input(s): PROCALCITON, LATICACIDVEN in the last 168 hours.  Recent Results (from the past 240 hours)  MRSA Next Gen by PCR, Nasal     Status: None   Collection Time: 11/27/24 11:53 PM   Specimen: Nasal Mucosa; Nasal Swab  Result Value Ref Range Status   MRSA by PCR Next Gen NOT DETECTED NOT DETECTED Final    Comment: (NOTE) The GeneXpert MRSA Assay (FDA approved for NASAL specimens only), is one component of a comprehensive MRSA colonization surveillance program. It is not intended to diagnose MRSA infection nor to guide or monitor treatment for MRSA infections. Test performance is not FDA approved in patients less than 74 years old. Performed at [facility], [address], [place], [place] [address]  Radiology Studies: No results found.         LOS: 3 days   Time spent= 35 mins    Burgess JAYSON Dare, MD [facility]  If 7PM-7AM, please contact night-coverage  11/30/2024, 11:01 AM  "

## 2024-11-30 NOTE — Progress Notes (Addendum)
 Day #3 after perimesencephalic nonaneurysmal hemorrhage.  Continues to have a headache, but not significantly different than before.  She is alert and oriented and conversant. Alert and oriented with normal speech expression, fluency and comprehension. Visual fields are full to confrontation.   Face is symmetric. Strength in the arms and legs is symmetric with no drift.  She has peripheral neuropathy in the feet, left more than right.  Sensation is otherwise grossly normal No ataxia. No inattention.  Assessment:  Day #3 after perimesencephalic nonaneurysmal hemorrhage.  As noted before, very low risk for spasm.  No further imaging needed at this time.

## 2024-11-30 NOTE — Hospital Course (Addendum)
 Brief Narrative:   59 year old with history of neck spasm, lower extremity numbness, nausea, left facial droop reported of having laser eye surgery 6 weeks ago for glaucoma thereafter continued right sided eye pain and headache.  There after 48 hours prior to admission started experiencing blurry vision in the right eye therefore came to the hospital.  CT of the head found to have bibasilar cisternal subarachnoid hemorrhage and elevated systolic blood pressure.  She was admitted to the ICU and started on antihypertensive drip.  CTA was negative for aneurysm.  She was also started on Keppra  for prophylaxis. Persistent headache, repeated CT head on 1/19 showing some concerns of vasospasm of by basilar artery.  Neuro IR is following.  Discharge to CIR  Assessment & Plan:  Subarachnoid hemorrhage Persistent headaches - Neuro IR is following the patient.  Repeat CTA of the head showing some vasospasm of by basilar artery.  Management per neuro IR - Echocardiogram showing preserved EF - PT/OT CIR -Tylenol  and oxycodone  ordered  Vasovagal - Isolated episode on 1/18  Hypertensive emergency History of essential hypertension - Currently on Nimodipine , HCTZ, ARB, Lopressor .  Increased clonidine  recently started following outpatient with nephrology for better blood pressure control. -Once Nimodipine  has been discontinued, transition to Norvasc   Headache, nausea and vomiting - Supportive care  Diabetes mellitus type 2 Peripheral neuropathy - Sliding scale and Accu-Chek.  A1c 6.7.  For now we will hold metformin , Mounjaro  and Tresiba.  Continue long-acting and sliding scale while at CIR. - Gabapentin   CKD stage IIIa - Creatinine around baseline of 1.1  Rheumatoid arthritis of chronic immunosuppressive's - Plaquenil   Hypothyroidism -Synthroid   Anxiety/depression - On Xanax , Cymbalta   GERD - PPI  Constipation - Continue bowel regimen   DVT prophylaxis: enoxaparin  (LOVENOX )  injection 40 mg Start: 11/29/24 2200 SCDs Start: 11/27/24 2353      Code Status: Full Code Family Communication:   DC to CIR  PT Follow up Recs: Acute Inpatient Rehab (3hours/Day)11/30/2024 1455  Subjective: Still having headache.   Examination:  General exam: Appears calm and comfortable  Respiratory system: Clear to auscultation. Respiratory effort normal. Cardiovascular system: S1 & S2 heard, RRR. No JVD, murmurs, rubs, gallops or clicks. No pedal edema. Gastrointestinal system: Abdomen is nondistended, soft and nontender. No organomegaly or masses felt. Normal bowel sounds heard. Central nervous system: Alert and oriented. No focal neurological deficits. Extremities: Symmetric 5 x 5 power. Skin: No rashes, lesions or ulcers Psychiatry: Judgement and insight appear normal. Mood & affect appropriate.

## 2024-11-30 NOTE — Progress Notes (Signed)
 Occupational Therapy Treatment Patient Details Name: Amy Adams MRN: [id number] DOB: 1966-08-03 Today's Date: 11/30/2024   History of present illness 59 y.o. female presents to [facility] hospital on 11/27/2024 with headache and pain posterior to R eye since laser eye surgery 6 weeks ago. On 1/14 she then developed neck spasms and facial numbness and L droop. 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 PMH includes HTN, CKD, HLD, glaucoma, DMII, OA, chronic low back pain, RLS.   OT comments  Returned to trial partial occlusion glasses. Pt with tape on R eye nasal position with improved vision. Educated on benefits and use of tape to progress, only use if still helping.  Will follow up as able.        If plan is discharge home, recommend the following:  A little help with walking and/or transfers;A little help with bathing/dressing/bathroom;Assistance with cooking/housework;Direct supervision/assist for medications management;Direct supervision/assist for financial management;Assist for transportation;Help with stairs or ramp for entrance   Equipment Recommendations  Tub/shower seat    Recommendations for Other Services PT consult    Precautions / Restrictions Precautions Precautions: Fall Recall of Precautions/Restrictions: Impaired Precaution/Restrictions Comments: SBP<160 Restrictions Weight Bearing Restrictions Per Provider Order: No       Mobility Bed Mobility         Transfers      Balance                        ADL either performed or assessed with clinical judgement   ADL       Extremity/Trunk Assessment     Vision  Diplopia Assessment: Present in far gaze;Present in near gaze;Objects split side to side Additional Comments: session focused on visual compensatory techniques using partial occulsion glasses.  Trialed L eye block with tape at nasal portion, but pt reports vision remains doublue but moved from R side to below.   Switched to tape to R eye nasal portion and pt reports vision much clearer.  Pt excited to be able to read much clearer.  Provided handout on glasses, will follow up.   Perception     Praxis     Communication Communication Communication: No apparent difficulties   Cognition Arousal: Alert Behavior During Therapy: WFL for tasks assessed/performed Cognition: Cognition impaired                                 Cueing   Cueing Techniques: Verbal cues  Exercises      Shoulder Instructions       General Comments     Pertinent Vitals/ Pain       Pain Assessment Pain Assessment: 0-10 Pain Score: 6  Pain Location: headache Pain Descriptors / Indicators: Headache Pain Intervention(s): Limited activity within patient's tolerance, Monitored during session, Repositioned  Home Living Family/patient expects to be discharged to:: Private residence Living Arrangements: Alone Available Help at Discharge: Family;Available 24 hours/day Type of Home: Mobile home Home Access: Stairs to enter Entrance Stairs-Number of Steps: 4 Entrance Stairs-Rails: Can reach both Home Layout: One level     Bathroom Shower/Tub: Producer, Television/film/video: Standard     Home Equipment: Cane - single Librarian, Academic (2 wheels);BSC/3in1   Additional Comments: staying with mom at dc as above      Prior Functioning/Environment              Frequency  Min 2X/week  Progress Toward Goals  OT Goals(current goals can now be found in the care plan section)  Progress towards OT goals: Progressing toward goals  Acute Rehab OT Goals Patient Stated Goal: get home OT Goal Formulation: With patient/family Time For Goal Achievement: 12/14/24 Potential to Achieve Goals: Good ADL Goals Pt Will Perform Grooming: with modified independence;standing Pt Will Perform Lower Body Dressing: with modified independence;sit to/from stand;sitting/lateral leans Pt Will Transfer  to Toilet: with modified independence;ambulating;bedside commode Pt Will Perform Toileting - Clothing Manipulation and hygiene: with modified independence;sitting/lateral leans;sit to/from stand Additional ADL Goal #1: Pt will utilize visual compensatory techniques to optimize independnece and safety during ADLs with independence. Additional ADL Goal #2: Pt will complete pill box test with independnece.  Plan      Co-evaluation                 AM-PAC OT 6 Clicks Daily Activity     Outcome Measure   Help from another person eating meals?: None Help from another person taking care of personal grooming?: A Little Help from another person toileting, which includes using toliet, bedpan, or urinal?: A Lot Help from another person bathing (including washing, rinsing, drying)?: A Little Help from another person to put on and taking off regular upper body clothing?: A Little Help from another person to put on and taking off regular lower body clothing?: A Little 6 Click Score: 18    End of Session Equipment Utilized During Treatment: Gait belt;Rolling walker (2 wheels)  OT Visit Diagnosis: Other abnormalities of gait and mobility (R26.89);Muscle weakness (generalized) (M62.81);Pain;Other symptoms and signs involving the nervous system (R29.898) Pain - part of body:  (headache)   Activity Tolerance Patient tolerated treatment well   Patient Left in bed;with call bell/phone within reach;with bed alarm set;with family/visitor present   Nurse Communication Mobility status        Time: 8651-8594 OT Time Calculation (min): 17 min  Charges: OT General Charges $OT Visit: 1 Visit $Therapeutic Activity: 8-22 mins  Etta NOVAK, OT Acute Rehabilitation Services Office [phone] Secure Chat Preferred    Etta GORMAN Hope 11/30/2024, 2:35 PM

## 2024-11-30 NOTE — Evaluation (Signed)
 Occupational Therapy Evaluation Patient Details Name: Amy Adams MRN: [id number] DOB: [birth year] Today's Date: 11/30/2024   History of Present Illness   59 y.o. female presents to [facility] hospital on 11/27/2024 with headache and pain posterior to R eye since laser eye surgery 6 weeks ago. On 1/14 she then developed neck spasms and facial numbness and L droop. 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 PMH includes HTN, CKD, HLD, glaucoma, DMII, OA, chronic low back pain, RLS.     Clinical Impressions PTA patient reports independent with ADLs, mobility and driving. Admitted for above and presents with problem list below.  Patient completing bed mobility with supervision, transfers with min assist using RW, short distance mobility in room using RW with min assist and Adls with up to min assist.  Pt stood at sink for grooming, decreased tolerance and LB unsteadiness noted.  No increased headache with ADLs, but stable at 6/10 per patient.  Reports diplopia throughout session, dysconjugate gaze noted, and will return to trial partial occlusion glasses.  Cognitively, pt demonstrates difficulty with problem solving, sequencing, and organizations; internally distracted by family dynamics and highly focused on dc home (understandably).  Recommend assist with transfers/mobility/standing ADLs (hands on), as well as for driving and IADLS (meds, cooking, fiances).   Based on performance today, pt will best benefit from continued OT services acutely and after dc at outpatient OT in order to optimize safety, independence and return to PLOF.  Will follow acutely.     If plan is discharge home, recommend the following:   A little help with walking and/or transfers;A little help with bathing/dressing/bathroom;Assistance with cooking/housework;Direct supervision/assist for medications management;Direct supervision/assist for financial management;Assist for transportation;Help with stairs or ramp  for entrance     Functional Status Assessment   Patient has had a recent decline in their functional status and demonstrates the ability to make significant improvements in function in a reasonable and predictable amount of time.     Equipment Recommendations   Tub/shower seat     Recommendations for Other Services   PT consult     Precautions/Restrictions   Precautions Precautions: Fall Recall of Precautions/Restrictions: Impaired Precaution/Restrictions Comments: SBP<160 Restrictions Weight Bearing Restrictions Per Provider Order: No     Mobility Bed Mobility Overal bed mobility: Needs Assistance Bed Mobility: Supine to Sit, Sit to Supine     Supine to sit: Supervision Sit to supine: Supervision        Transfers Overall transfer level: Needs assistance Equipment used: Rolling walker (2 wheels) Transfers: Sit to/from Stand Sit to Stand: Min assist           General transfer comment: for balance and hand placement      Balance Overall balance assessment: Needs assistance Sitting-balance support: No upper extremity supported, Feet supported Sitting balance-Leahy Scale: Fair Sitting balance - Comments: supervision at eOB   Standing balance support: Bilateral upper extremity supported, No upper extremity supported, During functional activity Standing balance-Leahy Scale: Poor Standing balance comment: contact gaurd with out UE support at sink statically, needs RW for mobility                           ADL either performed or assessed with clinical judgement   ADL Overall ADL's : Needs assistance/impaired     Grooming: Contact guard assist;Wash/dry hands;Standing           Upper Body Dressing : Set up;Sitting   Lower Body Dressing: Minimal  assistance;Sit to/from stand   Toilet Transfer: Minimal assistance;Ambulation;Rolling walker (2 wheels)           Functional mobility during ADLs: Minimal assistance;Rolling walker (2  wheels);Cueing for safety;Cueing for sequencing       Vision Baseline Vision/History: 1 Wears glasses Ability to See in Adequate Light: 1 Impaired Patient Visual Report: Diplopia;Blurring of vision Vision Assessment?: Wears glasses for reading;Yes Eye Alignment: Impaired (comment) Ocular Range of Motion: Within Functional Limits Diplopia Assessment: Present in far gaze;Present in near gaze;Objects split side to side Additional Comments: visually, pt reports having surgery on R eye 6 weeks ago (laser eye surgery).  She reports diplopia with overlapping on R side when reading at distace, and when looking at therapist.  Patient with dysconjugate gaze.     Perception         Praxis         Pertinent Vitals/Pain Pain Assessment Pain Assessment: 0-10 Pain Score: 6  Pain Location: headache Pain Descriptors / Indicators: Headache Pain Intervention(s): Limited activity within patient's tolerance, Monitored during session, Repositioned     Extremity/Trunk Assessment Upper Extremity Assessment Upper Extremity Assessment: Right hand dominant;Generalized weakness   Lower Extremity Assessment Lower Extremity Assessment: Defer to PT evaluation   Cervical / Trunk Assessment Cervical / Trunk Assessment: Normal   Communication Communication Communication: No apparent difficulties   Cognition Arousal: Alert Behavior During Therapy: WFL for tasks assessed/performed Cognition: Cognition impaired         Attention impairment (select first level of impairment): Sustained attention Executive functioning impairment (select all impairments): Organization, Sequencing, Reasoning, Problem solving OT - Cognition Comments: pt following commands but easily distracted.  pt internally distracted with family situation. verbose but pleasant.                 Following commands: Impaired Following commands impaired: Follows one step commands with increased time, Follows multi-step commands  inconsistently     Cueing  General Comments   Cueing Techniques: Verbal cues  BP after session 153/74 (98)   Exercises     Shoulder Instructions      Home Living Family/patient expects to be discharged to:: Private residence Living Arrangements: Alone Available Help at Discharge: Family;Available 24 hours/day Type of Home: Mobile home Home Access: Stairs to enter [employer] of Steps: 4 Entrance Stairs-Rails: Can reach both Home Layout: One level     Bathroom Shower/Tub: Producer, Television/film/video: Standard     Home Equipment: Cane - single Librarian, Academic (2 wheels);BSC/3in1   Additional Comments: staying with mom at dc as above      Prior Functioning/Environment Prior Level of Function : Independent/Modified Independent;Driving             Mobility Comments: ambulatory without DME      OT Problem List: Decreased strength;Decreased activity tolerance;Impaired balance (sitting and/or standing);Pain;Decreased knowledge of precautions;Decreased knowledge of use of DME or AE;Decreased cognition;Decreased safety awareness   OT Treatment/Interventions: Self-care/ADL training;Therapeutic exercise;DME and/or AE instruction;Therapeutic activities;Cognitive remediation/compensation;Balance training;Patient/family education      OT Goals(Current goals can be found in the care plan section)   Acute Rehab OT Goals Patient Stated Goal: get home OT Goal Formulation: With patient/family Time For Goal Achievement: 12/14/24 Potential to Achieve Goals: Good   OT Frequency:  Min 2X/week    Co-evaluation              AM-PAC OT 6 Clicks Daily Activity     Outcome Measure Help from another person eating meals?:  None Help from another person taking care of personal grooming?: A Little Help from another person toileting, which includes using toliet, bedpan, or urinal?: A Lot Help from another person bathing (including washing, rinsing,  drying)?: A Little Help from another person to put on and taking off regular upper body clothing?: A Little Help from another person to put on and taking off regular lower body clothing?: A Lot 6 Click Score: 17   End of Session Equipment Utilized During Treatment: Gait belt;Rolling walker (2 wheels) Nurse Communication: Mobility status  Activity Tolerance: Patient tolerated treatment well Patient left: in bed;with call bell/phone within reach;with bed alarm set;with family/visitor present  OT Visit Diagnosis: Other abnormalities of gait and mobility (R26.89);Muscle weakness (generalized) (M62.81);Pain;Other symptoms and signs involving the nervous system (R29.898) Pain - part of body:  (headache)                Time: 8687-8659 OT Time Calculation (min): 28 min Charges:  OT General Charges $OT Visit: 1 Visit OT Evaluation $OT Eval Moderate Complexity: 1 Mod OT Treatments $Self Care/Home Management : 8-22 mins  Etta NOVAK, OT Acute Rehabilitation Services Office [phone] Secure Chat Preferred    Etta GORMAN Hope 11/30/2024, 2:21 PM

## 2024-11-30 NOTE — Progress Notes (Signed)
 Physical Therapy Treatment Patient Details Name: Amy Adams MRN: [id number] DOB: 09/22/1966 Today's Date: 11/30/2024   History of Present Illness 59 y.o. female presents to [facility] hospital on 11/27/2024 with headache and pain posterior to R eye since laser eye surgery 6 weeks ago. On 1/14 she then developed neck spasms and facial numbness and L droop. 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 PMH includes HTN, CKD, HLD, glaucoma, DMII, OA, chronic low back pain, RLS.    PT Comments  Pt demonstrating good progress towards acute goals this session, eager for mobility on hand off from OT. Pt needing dense cues for attention to task and safety awareness throughout session. Pt demonstrating transfers sit<>stand, gait with RW for support and stair negotiation with bilateral UE support with grossly CGA-supervision. Pt with small posterior LOB while standing without UE to attend to underwear with pt able to self correct. Pt was educated on continued walker use to maximize functional independence, safety, and decrease risk for falls as well as utilizing at least single UE support in standing with ADLs. Pt mother present throughout session and supportive. Pt continues to benefit from skilled PT services to progress toward functional mobility goals.      If plan is discharge home, recommend the following: A lot of help with walking and/or transfers;A lot of help with bathing/dressing/bathroom;Assistance with cooking/housework;Assist for transportation;Help with stairs or ramp for entrance;Supervision due to cognitive status   Can travel by private vehicle        Equipment Recommendations  Rolling walker (2 wheels);BSC/3in1    Recommendations for Other Services       Precautions / Restrictions Precautions Precautions: Fall Recall of Precautions/Restrictions: Impaired Precaution/Restrictions Comments: SBP<160 Restrictions Weight Bearing Restrictions Per Provider Order: No      Mobility  Bed Mobility Overal bed mobility: Needs Assistance Bed Mobility: Supine to Sit, Sit to Supine     Supine to sit: Supervision Sit to supine: Supervision   General bed mobility comments: supervision with HOB elevated    Transfers Overall transfer level: Needs assistance Equipment used: Rolling walker (2 wheels) Transfers: Sit to/from Stand Sit to Stand: Contact guard assist           General transfer comment: CGA from EOB and chair and rollator seat in gym, cues for RW    Ambulation/Gait Ambulation/Gait assistance: Contact guard assist Gait Distance (Feet): 150 Feet (x2) Assistive device: Rolling walker (2 wheels) Gait Pattern/deviations: Step-through pattern Gait velocity: reduced     General Gait Details: pt ambulating with a reciprocal gait pattern with RW for support, no overt LOB, cues for attention to task as pt easily distractible   Stairs Stairs: Yes Stairs assistance: Contact guard assist Stair Management: Two rails, Step to pattern, Forwards Number of Stairs: 4 General stair comments: pt able to ascend/descend 2 steps x2 bouts in therapy gym with bilateral rail support   Wheelchair Mobility     Tilt Bed    Modified Rankin (Stroke Patients Only) Modified Rankin (Stroke Patients Only) Pre-Morbid Rankin Score: No symptoms Modified Rankin: Moderately severe disability     Balance Overall balance assessment: Needs assistance Sitting-balance support: No upper extremity supported, Feet supported Sitting balance-Leahy Scale: Fair Sitting balance - Comments: supervision at eOB   Standing balance support: Bilateral upper extremity supported, No upper extremity supported, During functional activity Standing balance-Leahy Scale: Poor Standing balance comment: pt with mild posterior LOB stanidng and pulling up briefs with no UE support, pt able to self correct  Communication Communication Communication:  No apparent difficulties  Cognition Arousal: Alert Behavior During Therapy: WFL for tasks assessed/performed                             Following commands: Impaired Following commands impaired: Follows one step commands with increased time, Follows multi-step commands inconsistently    Cueing Cueing Techniques: Verbal cues  Exercises      General Comments General comments (skin integrity, edema, etc.): multiple family memebers present and supportive, discussed utilizing BSC by bed at night due to urinary urgency for safety and fall prevention      Pertinent Vitals/Pain Pain Assessment Pain Assessment: 0-10 Pain Score: 6  Pain Location: headache Pain Descriptors / Indicators: Headache Pain Intervention(s): Monitored during session, Limited activity within patient's tolerance    Home Living Family/patient expects to be discharged to:: Private residence Living Arrangements: Alone Available Help at Discharge: Family;Available 24 hours/day Type of Home: Mobile home Home Access: Stairs to enter Entrance Stairs-Rails: Can reach both Entrance Stairs-Number of Steps: 4   Home Layout: One level Home Equipment: Cane - single Librarian, Academic (2 wheels);BSC/3in1 Additional Comments: staying with mom at dc as above    Prior Function            PT Goals (current goals can now be found in the care plan section) Acute Rehab PT Goals PT Goal Formulation: With patient/family Time For Goal Achievement: 12/13/24 Progress towards PT goals: Progressing toward goals    Frequency    Min 3X/week      PT Plan      Co-evaluation              AM-PAC PT 6 Clicks Mobility   Outcome Measure  Help needed turning from your back to your side while in a flat bed without using bedrails?: A Little Help needed moving from lying on your back to sitting on the side of a flat bed without using bedrails?: A Little Help needed moving to and from a bed to a chair  (including a wheelchair)?: A Little Help needed standing up from a chair using your arms (e.g., wheelchair or bedside chair)?: A Little Help needed to walk in hospital room?: A Little Help needed climbing 3-5 steps with a railing? : A Little 6 Click Score: 18    End of Session Equipment Utilized During Treatment: Gait belt Activity Tolerance: Patient tolerated treatment well Patient left: in bed;with call bell/phone within reach;with family/visitor present Nurse Communication: Mobility status PT Visit Diagnosis: Other abnormalities of gait and mobility (R26.89);Difficulty in walking, not elsewhere classified (R26.2);Other symptoms and signs involving the nervous system (R29.898)     Time: 1402-1430 PT Time Calculation (min) (ACUTE ONLY): 28 min  Charges:    $Gait Training: 23-37 mins PT General Charges $$ ACUTE PT VISIT: 1 Visit                     Amy Hinesley R. PTA Acute Rehabilitation Services Office: [phone]   Therisa CHRISTELLA Boor 11/30/2024, 3:30 PM

## 2024-11-30 NOTE — Plan of Care (Signed)
" °  Problem: Education: Goal: Knowledge of General Education information will improve Description: Including pain rating scale, medication(s)/side effects and non-pharmacologic comfort measures Outcome: Progressing   Problem: Health Behavior/Discharge Planning: Goal: Ability to manage health-related needs will improve Outcome: Progressing   Problem: Elimination: Goal: Will not experience complications related to bowel motility Outcome: Progressing   Problem: Tissue Perfusion: Goal: Adequacy of tissue perfusion will improve Outcome: Progressing   "

## 2024-12-01 DIAGNOSIS — I609 Nontraumatic subarachnoid hemorrhage, unspecified: Secondary | ICD-10-CM | POA: Diagnosis not present

## 2024-12-01 LAB — GLUCOSE, CAPILLARY
Glucose-Capillary: 112 mg/dL — ABNORMAL HIGH (ref 70–99)
Glucose-Capillary: 152 mg/dL — ABNORMAL HIGH (ref 70–99)
Glucose-Capillary: 133 mg/dL — ABNORMAL HIGH (ref 70–99)
Glucose-Capillary: 139 mg/dL — ABNORMAL HIGH (ref 70–99)
Glucose-Capillary: 218 mg/dL — ABNORMAL HIGH (ref 70–99)

## 2024-12-01 MED ORDER — INSULIN GLARGINE 100 UNIT/ML ~~LOC~~ SOLN
14.0000 [IU] | Freq: Every day | SUBCUTANEOUS | Status: DC
  Administered 2024-12-01 – 2024-12-05 (×5): 14 [IU] via SUBCUTANEOUS
  Filled 2024-12-01 (×5): qty 0.14

## 2024-12-01 MED ORDER — NALOXONE HCL 0.4 MG/ML IJ SOLN
INTRAMUSCULAR | Status: AC
  Filled 2024-12-01: qty 1

## 2024-12-01 MED ORDER — TRAMADOL HCL 50 MG PO TABS
50.0000 mg | ORAL_TABLET | Freq: Four times a day (QID) | ORAL | Status: DC | PRN
  Administered 2024-12-02: 50 mg via ORAL
  Filled 2024-12-01: qty 1

## 2024-12-01 MED ORDER — OXYCODONE HCL 5 MG PO TABS
5.0000 mg | ORAL_TABLET | ORAL | Status: DC | PRN
  Administered 2024-12-01 – 2024-12-02 (×3): 5 mg via ORAL
  Filled 2024-12-01 (×3): qty 1

## 2024-12-01 MED ORDER — SODIUM CHLORIDE 0.9 % IV BOLUS
500.0000 mL | Freq: Once | INTRAVENOUS | Status: AC
  Administered 2024-12-01: 500 mL via INTRAVENOUS

## 2024-12-01 NOTE — Progress Notes (Signed)
 Patient on toilet became diaphoretic and lethargic. MD notified. 500 ml bolus and EKG ordered. BP 105/67 HR 66

## 2024-12-01 NOTE — Progress Notes (Signed)
 " PROGRESS NOTE    Amy Adams  FMW:[id number] DOB: 14-Aug-1966 DOA: 11/27/2024 PCP: Arloa Elsie SAUNDERS, MD    Brief Narrative:   59 year old with history of neck spasm, lower extremity numbness, nausea, left facial droop reported of having laser eye surgery 6 weeks ago for glaucoma thereafter continued right sided eye pain and headache.  There after 48 hours prior to admission started experiencing blurry vision in the right eye therefore came to the hospital.  CT of the head found to have bibasilar cisternal subarachnoid hemorrhage and elevated systolic blood pressure.  She was admitted to the ICU and started on antihypertensive drip.  CTA was negative for aneurysm.  She was also started on Keppra  for prophylaxis.  Assessment & Plan:  Subarachnoid hemorrhage - Seen by neurosurgery who recommended supportive care as they suspect this is a venous hemorrhage and low risk for recurrence. - Echocardiogram showing preserved EF - PT/OT CIR  Hypertensive emergency History of essential hypertension - Currently on Norvasc , HCTZ, ARB, Lopressor .  Resume her outpatient clonidine .  Recently started following outpatient with nephrology for better blood pressure control - IV as needed  Headache, nausea and vomiting - Supportive care  Diabetes mellitus type 2 Peripheral neuropathy - Sliding scale and Accu-Chek.  A1c 6.7 - Gabapentin   CKD stage IIIa - Creatinine around baseline of 1.1  Rheumatoid arthritis of chronic immunosuppressive's - Plaquenil   Hypothyroidism -Synthroid   Anxiety/depression - On Xanax , Cymbalta    DVT prophylaxis: enoxaparin  (LOVENOX ) injection 40 mg Start: 11/29/24 2200 SCDs Start: 11/27/24 2353      Code Status: Full Code Family Communication:   Continue hospital stay until cleared by neurology.  Hopefully next 24-48 hours once her headaches better controlled as well   PT Follow up Recs: Acute Inpatient Rehab (3hours/Day)11/29/2024 1737  Subjective: Seen  at bedside.  Still reporting of quite a bit of headache  Examination:  General exam: Appears calm and comfortable  Respiratory system: Clear to auscultation. Respiratory effort normal. Cardiovascular system: S1 & S2 heard, RRR. No JVD, murmurs, rubs, gallops or clicks. No pedal edema. Gastrointestinal system: Abdomen is nondistended, soft and nontender. No organomegaly or masses felt. Normal bowel sounds heard. Central nervous system: Alert and oriented. No focal neurological deficits. Extremities: Symmetric 5 x 5 power. Skin: No rashes, lesions or ulcers Psychiatry: Judgement and insight appear normal. Mood & affect appropriate.                Diet Orders (From admission, onward)     Start     Ordered   11/28/24 1002  Diet Heart Room service appropriate? Yes with Assist; Fluid consistency: Thin  Diet effective now       Question Answer Comment  Room service appropriate? Yes with Assist   Fluid consistency: Thin      11/28/24 1001            Objective: Vitals:   11/30/24 1629 11/30/24 2200 12/01/24 0425 12/01/24 0756  BP: (!) 160/81 (!) 142/82 (!) 157/80 (!) 158/79  Pulse: 70 68 63 66  Resp: 17   19  Temp: (!) 97.5 F (36.4 C)  97.7 F (36.5 C) 97.9 F (36.6 C)  TempSrc: Oral  Oral Oral  SpO2: 97% 99% 98% 100%  Weight:      Height:       No intake or output data in the 24 hours ending 12/01/24 1014 Filed Weights   11/28/24 0500 11/29/24 0500 11/30/24 0500  Weight: 97.4 kg 94.1 kg 96.6 kg  Scheduled Meds:  ALPRAZolam   1 mg Oral TID   amLODipine   10 mg Oral Daily   butalbital -acetaminophen -caffeine   2 tablet Oral Q6H   Chlorhexidine  Gluconate Cloth  6 each Topical Daily   cloNIDine   0.1 mg Oral BID   DULoxetine   60 mg Oral BID   enoxaparin  (LOVENOX ) injection  40 mg Subcutaneous Q24H   fenofibrate   54 mg Oral Daily   gabapentin   400 mg Oral TID   hydrochlorothiazide   25 mg Oral Daily   hydroxychloroquine   400 mg Oral Daily   insulin  aspart   0-5 Units Subcutaneous QHS   insulin  aspart  0-9 Units Subcutaneous TID WC   insulin  glargine  14 Units Subcutaneous Daily   irbesartan   300 mg Oral Daily   leflunomide   20 mg Oral Daily   levothyroxine   50 mcg Oral Q0600   lidocaine   1 patch Transdermal Q24H   metoprolol  tartrate  50 mg Oral BID   Continuous Infusions:  Nutritional status     Body mass index is 32.38 kg/m.  Data Reviewed:   CBC: Recent Labs  Lab 11/27/24 1832 11/28/24 0210  WBC 7.5 10.1  NEUTROABS 5.4  --   HGB 14.3 14.1  HCT 43.2 41.7  MCV 84.4 84.1  PLT 224 227   Basic Metabolic Panel: Recent Labs  Lab 11/27/24 1832 11/28/24 0210  NA 136 137  K 3.4* 3.6  CL 99 99  CO2 20* 24  GLUCOSE 219* 203*  BUN 17 18  CREATININE 1.12* 1.09*  CALCIUM  9.5 9.5  MG  --  1.9   GFR: Estimated Creatinine Clearance: 66.7 mL/min (A) (by C-G formula based on SCr of 1.09 mg/dL (H)). Liver Function Tests: Recent Labs  Lab 11/27/24 1832  AST 26  ALT 11  ALKPHOS 79  BILITOT 0.6  PROT 7.2  ALBUMIN 4.4   No results for input(s): LIPASE, AMYLASE in the last 168 hours. No results for input(s): AMMONIA in the last 168 hours. Coagulation Profile: Recent Labs  Lab 11/27/24 1832  INR 0.9   Cardiac Enzymes: No results for input(s): CKTOTAL, CKMB, CKMBINDEX, TROPONINI in the last 168 hours. BNP (last 3 results) No results for input(s): PROBNP in the last 8760 hours. HbA1C: No results for input(s): HGBA1C in the last 72 hours. CBG: Recent Labs  Lab 11/30/24 0605 11/30/24 1154 11/30/24 1631 11/30/24 2131 12/01/24 0622  GLUCAP 173* 188* 175* 268* 112*   Lipid Profile: No results for input(s): CHOL, HDL, LDLCALC, TRIG, CHOLHDL, LDLDIRECT in the last 72 hours. Thyroid  Function Tests: No results for input(s): TSH, T4TOTAL, FREET4, T3FREE, THYROIDAB in the last 72 hours. Anemia Panel: No results for input(s): VITAMINB12, FOLATE, FERRITIN, TIBC, IRON,  RETICCTPCT in the last 72 hours. Sepsis Labs: No results for input(s): PROCALCITON, LATICACIDVEN in the last 168 hours.  Recent Results (from the past 240 hours)  MRSA Next Gen by PCR, Nasal     Status: None   Collection Time: 11/27/24 11:53 PM   Specimen: Nasal Mucosa; Nasal Swab  Result Value Ref Range Status   MRSA by PCR Next Gen NOT DETECTED NOT DETECTED Final    Comment: (NOTE) The GeneXpert MRSA Assay (FDA approved for NASAL specimens only), is one component of a comprehensive MRSA colonization surveillance program. It is not intended to diagnose MRSA infection nor to guide or monitor treatment for MRSA infections. Test performance is not FDA approved in patients less than 81 years old. Performed at [facility], [address]  [place], [place] [address]          Radiology Studies: No results found.         LOS: 4 days   Time spent= 35 mins    Burgess JAYSON Dare, MD [facility]  If 7PM-7AM, please contact night-coverage  12/01/2024, 10:14 AM  "

## 2024-12-01 NOTE — Plan of Care (Signed)

## 2024-12-01 NOTE — Plan of Care (Signed)
  Problem: Education: Goal: Knowledge of General Education information will improve Description: Including pain rating scale, medication(s)/side effects and non-pharmacologic comfort measures Outcome: Progressing   Problem: Clinical Measurements: Goal: Will remain free from infection Outcome: Progressing   Problem: Elimination: Goal: Will not experience complications related to urinary retention Outcome: Progressing   

## 2024-12-02 ENCOUNTER — Inpatient Hospital Stay (HOSPITAL_COMMUNITY)

## 2024-12-02 DIAGNOSIS — I609 Nontraumatic subarachnoid hemorrhage, unspecified: Secondary | ICD-10-CM | POA: Diagnosis not present

## 2024-12-02 LAB — GLUCOSE, CAPILLARY
Glucose-Capillary: 169 mg/dL — ABNORMAL HIGH (ref 70–99)
Glucose-Capillary: 170 mg/dL — ABNORMAL HIGH (ref 70–99)
Glucose-Capillary: 99 mg/dL (ref 70–99)
Glucose-Capillary: 137 mg/dL — ABNORMAL HIGH (ref 70–99)

## 2024-12-02 MED ORDER — IOHEXOL 350 MG/ML SOLN
75.0000 mL | Freq: Once | INTRAVENOUS | Status: AC | PRN
  Administered 2024-12-02: 75 mL via INTRAVENOUS

## 2024-12-02 MED ORDER — CLONIDINE HCL 0.1 MG PO TABS
0.2000 mg | ORAL_TABLET | Freq: Two times a day (BID) | ORAL | Status: DC
  Administered 2024-12-02 (×2): 0.2 mg via ORAL
  Filled 2024-12-02 (×2): qty 2

## 2024-12-02 MED ORDER — OXYCODONE HCL 5 MG PO TABS
5.0000 mg | ORAL_TABLET | ORAL | Status: DC | PRN
  Administered 2024-12-02 – 2024-12-05 (×8): 10 mg via ORAL
  Filled 2024-12-02 (×3): qty 2
  Filled 2024-12-02: qty 1
  Filled 2024-12-02: qty 2
  Filled 2024-12-02: qty 1
  Filled 2024-12-02 (×4): qty 2

## 2024-12-02 MED ORDER — ACETAMINOPHEN 500 MG PO TABS
1000.0000 mg | ORAL_TABLET | Freq: Three times a day (TID) | ORAL | Status: DC
  Administered 2024-12-02 – 2024-12-03 (×2): 1000 mg via ORAL
  Filled 2024-12-02 (×7): qty 2

## 2024-12-02 NOTE — Plan of Care (Signed)
  Problem: Education: Goal: Knowledge of General Education information will improve Description: Including pain rating scale, medication(s)/side effects and non-pharmacologic comfort measures Outcome: Progressing   Problem: Health Behavior/Discharge Planning: Goal: Ability to manage health-related needs will improve Outcome: Progressing   Problem: Clinical Measurements: Goal: Ability to maintain clinical measurements within normal limits will improve Outcome: Progressing Goal: Will remain free from infection Outcome: Progressing Goal: Diagnostic test results will improve Outcome: Progressing Goal: Respiratory complications will improve Outcome: Progressing Goal: Cardiovascular complication will be avoided Outcome: Progressing   Problem: Activity: Goal: Risk for activity intolerance will decrease Outcome: Progressing   Problem: Nutrition: Goal: Adequate nutrition will be maintained Outcome: Progressing   Problem: Coping: Goal: Level of anxiety will decrease Outcome: Progressing   Problem: Elimination: Goal: Will not experience complications related to bowel motility Outcome: Progressing Goal: Will not experience complications related to urinary retention Outcome: Progressing   Problem: Pain Managment: Goal: General experience of comfort will improve and/or be controlled Outcome: Progressing   Problem: Safety: Goal: Ability to remain free from injury will improve Outcome: Progressing   Problem: Skin Integrity: Goal: Risk for impaired skin integrity will decrease Outcome: Progressing   Problem: Education: Goal: Ability to describe self-care measures that may prevent or decrease complications (Diabetes Survival Skills Education) will improve Outcome: Progressing Goal: Individualized Educational Video(s) Outcome: Progressing   Problem: Coping: Goal: Ability to adjust to condition or change in health will improve Outcome: Progressing   Problem: Fluid  Volume: Goal: Ability to maintain a balanced intake and output will improve Outcome: Progressing   Problem: Health Behavior/Discharge Planning: Goal: Ability to identify and utilize available resources and services will improve Outcome: Progressing Goal: Ability to manage health-related needs will improve Outcome: Progressing   Problem: Metabolic: Goal: Ability to maintain appropriate glucose levels will improve Outcome: Progressing   Problem: Nutritional: Goal: Maintenance of adequate nutrition will improve Outcome: Progressing Goal: Progress toward achieving an optimal weight will improve Outcome: Progressing   Problem: Skin Integrity: Goal: Risk for impaired skin integrity will decrease Outcome: Progressing   Problem: Tissue Perfusion: Goal: Adequacy of tissue perfusion will improve Outcome: Progressing   Problem: Education: Goal: Knowledge of General Education information will improve Description: Including pain rating scale, medication(s)/side effects and non-pharmacologic comfort measures Outcome: Progressing   Problem: Health Behavior/Discharge Planning: Goal: Ability to manage health-related needs will improve Outcome: Progressing   Problem: Clinical Measurements: Goal: Ability to maintain clinical measurements within normal limits will improve Outcome: Progressing Goal: Will remain free from infection Outcome: Progressing Goal: Diagnostic test results will improve Outcome: Progressing Goal: Respiratory complications will improve Outcome: Progressing Goal: Cardiovascular complication will be avoided Outcome: Progressing   Problem: Activity: Goal: Risk for activity intolerance will decrease Outcome: Progressing   Problem: Nutrition: Goal: Adequate nutrition will be maintained Outcome: Progressing   Problem: Coping: Goal: Level of anxiety will decrease Outcome: Progressing   Problem: Elimination: Goal: Will not experience complications related to  bowel motility Outcome: Progressing Goal: Will not experience complications related to urinary retention Outcome: Progressing   Problem: Pain Managment: Goal: General experience of comfort will improve and/or be controlled Outcome: Progressing   Problem: Safety: Goal: Ability to remain free from injury will improve Outcome: Progressing   Problem: Skin Integrity: Goal: Risk for impaired skin integrity will decrease Outcome: Progressing

## 2024-12-02 NOTE — Significant Event (Signed)
 CT angiogram head and neck results was discussed with Dr. Rosslyn on-call neurosurgeon.  Redia Cleaver MD.

## 2024-12-02 NOTE — Progress Notes (Signed)
 " PROGRESS NOTE    Amy Adams  FMW:[id number] DOB: 1966/09/15 DOA: 11/27/2024 PCP: Arloa Elsie SAUNDERS, MD    Brief Narrative:   59 year old with history of neck spasm, lower extremity numbness, nausea, left facial droop reported of having laser eye surgery 6 weeks ago for glaucoma thereafter continued right sided eye pain and headache.  There after 48 hours prior to admission started experiencing blurry vision in the right eye therefore came to the hospital.  CT of the head found to have bibasilar cisternal subarachnoid hemorrhage and elevated systolic blood pressure.  She was admitted to the ICU and started on antihypertensive drip.  CTA was negative for aneurysm.  She was also started on Keppra  for prophylaxis.  Assessment & Plan:  Subarachnoid hemorrhage Persistent headaches - Seen by neurosurgery who recommended supportive care as they suspect this is a venous hemorrhage and low risk for recurrence.  Still having persistent headache therefore requested neuro IR for repeat evaluation.  Wonder if she needs repeat CT head. - Echocardiogram showing preserved EF - PT/OT CIR -Tylenol  and oxycodone  ordered  Vasovagal - Had 1 episode yesterday.  EKG unremarkable, received IV fluids.  Stable this morning  Hypertensive emergency History of essential hypertension - Currently on Norvasc , HCTZ, ARB, Lopressor .  Resume her outpatient clonidine .  Recently started following outpatient with nephrology for better blood pressure control - IV as needed  Headache, nausea and vomiting - Supportive care  Diabetes mellitus type 2 Peripheral neuropathy - Sliding scale and Accu-Chek.  A1c 6.7 - Gabapentin   CKD stage IIIa - Creatinine around baseline of 1.1  Rheumatoid arthritis of chronic immunosuppressive's - Plaquenil   Hypothyroidism -Synthroid   Anxiety/depression - On Xanax , Cymbalta    DVT prophylaxis: enoxaparin  (LOVENOX ) injection 40 mg Start: 11/29/24 2200 SCDs Start: 11/27/24  2353      Code Status: Full Code Family Communication:   Cleared by neuro IR, she will need CIR.  She is agreeable to CIR   PT Follow up Recs: Acute Inpatient Rehab (3hours/Day)11/30/2024 1455  Subjective: Had episode of vagal episode yesterday.  Received 500 cc normal saline bolus.  EKG was unremarkable.  Headache still persist Agreeable for CIR this morning  Examination:  General exam: Appears calm and comfortable  Respiratory system: Clear to auscultation. Respiratory effort normal. Cardiovascular system: S1 & S2 heard, RRR. No JVD, murmurs, rubs, gallops or clicks. No pedal edema. Gastrointestinal system: Abdomen is nondistended, soft and nontender. No organomegaly or masses felt. Normal bowel sounds heard. Central nervous system: Alert and oriented. No focal neurological deficits. Extremities: Symmetric 5 x 5 power. Skin: No rashes, lesions or ulcers Psychiatry: Judgement and insight appear normal. Mood & affect appropriate.                Diet Orders (From admission, onward)     Start     Ordered   11/28/24 1002  Diet Heart Room service appropriate? Yes with Assist; Fluid consistency: Thin  Diet effective now       Question Answer Comment  Room service appropriate? Yes with Assist   Fluid consistency: Thin      11/28/24 1001            Objective: Vitals:   12/01/24 2200 12/02/24 0400 12/02/24 0500 12/02/24 0851  BP: (!) 166/78 (!) 156/66  (!) 157/84  Pulse: 72 69  73  Resp:    18  Temp: 97.7 F (36.5 C)   97.8 F (36.6 C)  TempSrc: Oral   Oral  SpO2: 99%  97%  100%  Weight:   97.6 kg   Height:        Intake/Output Summary (Last 24 hours) at 12/02/2024 0957 Last data filed at 12/01/2024 1527 Gross per 24 hour  Intake 0 ml  Output --  Net 0 ml   Filed Weights   11/29/24 0500 11/30/24 0500 12/02/24 0500  Weight: 94.1 kg 96.6 kg 97.6 kg    Scheduled Meds:  acetaminophen   1,000 mg Oral TID   ALPRAZolam   1 mg Oral TID   amLODipine   10  mg Oral Daily   butalbital -acetaminophen -caffeine   2 tablet Oral Q6H   Chlorhexidine  Gluconate Cloth  6 each Topical Daily   cloNIDine   0.2 mg Oral BID   DULoxetine   60 mg Oral BID   enoxaparin  (LOVENOX ) injection  40 mg Subcutaneous Q24H   fenofibrate   54 mg Oral Daily   gabapentin   400 mg Oral TID   hydrochlorothiazide   25 mg Oral Daily   hydroxychloroquine   400 mg Oral Daily   insulin  aspart  0-5 Units Subcutaneous QHS   insulin  aspart  0-9 Units Subcutaneous TID WC   insulin  glargine  14 Units Subcutaneous Daily   irbesartan   300 mg Oral Daily   leflunomide   20 mg Oral Daily   levothyroxine   50 mcg Oral Q0600   lidocaine   1 patch Transdermal Q24H   metoprolol  tartrate  50 mg Oral BID   Continuous Infusions:  Nutritional status     Body mass index is 32.72 kg/m.  Data Reviewed:   CBC: Recent Labs  Lab 11/27/24 1832 11/28/24 0210  WBC 7.5 10.1  NEUTROABS 5.4  --   HGB 14.3 14.1  HCT 43.2 41.7  MCV 84.4 84.1  PLT 224 227   Basic Metabolic Panel: Recent Labs  Lab 11/27/24 1832 11/28/24 0210  NA 136 137  K 3.4* 3.6  CL 99 99  CO2 20* 24  GLUCOSE 219* 203*  BUN 17 18  CREATININE 1.12* 1.09*  CALCIUM  9.5 9.5  MG  --  1.9   GFR: Estimated Creatinine Clearance: 67.1 mL/min (A) (by C-G formula based on SCr of 1.09 mg/dL (H)). Liver Function Tests: Recent Labs  Lab 11/27/24 1832  AST 26  ALT 11  ALKPHOS 79  BILITOT 0.6  PROT 7.2  ALBUMIN 4.4   No results for input(s): LIPASE, AMYLASE in the last 168 hours. No results for input(s): AMMONIA in the last 168 hours. Coagulation Profile: Recent Labs  Lab 11/27/24 1832  INR 0.9   Cardiac Enzymes: No results for input(s): CKTOTAL, CKMB, CKMBINDEX, TROPONINI in the last 168 hours. BNP (last 3 results) No results for input(s): PROBNP in the last 8760 hours. HbA1C: No results for input(s): HGBA1C in the last 72 hours. CBG: Recent Labs  Lab 12/01/24 1132 12/01/24 1453  12/01/24 1740 12/01/24 2114 12/02/24 0610  GLUCAP 152* 133* 139* 218* 169*   Lipid Profile: No results for input(s): CHOL, HDL, LDLCALC, TRIG, CHOLHDL, LDLDIRECT in the last 72 hours. Thyroid  Function Tests: No results for input(s): TSH, T4TOTAL, FREET4, T3FREE, THYROIDAB in the last 72 hours. Anemia Panel: No results for input(s): VITAMINB12, FOLATE, FERRITIN, TIBC, IRON, RETICCTPCT in the last 72 hours. Sepsis Labs: No results for input(s): PROCALCITON, LATICACIDVEN in the last 168 hours.  Recent Results (from the past 240 hours)  MRSA Next Gen by PCR, Nasal     Status: None   Collection Time: 11/27/24 11:53 PM   Specimen: Nasal Mucosa; Nasal Swab  Result Value Ref  Range Status   MRSA by PCR Next Gen NOT DETECTED NOT DETECTED Final    Comment: (NOTE) The GeneXpert MRSA Assay (FDA approved for NASAL specimens only), is one component of a comprehensive MRSA colonization surveillance program. It is not intended to diagnose MRSA infection nor to guide or monitor treatment for MRSA infections. Test performance is not FDA approved in patients less than 67 years old. Performed at [facility], [address], [place], [place] [address]          Radiology Studies: No results found.         LOS: 5 days   Time spent= 35 mins    Burgess JAYSON Dare, MD [facility]  If 7PM-7AM, please contact night-coverage  12/02/2024, 9:57 AM  "

## 2024-12-02 NOTE — PMR Pre-admission (Signed)
 PMR Admission Coordinator Pre-Admission Assessment  Patient: Amy Adams is an 59 y.o., female MRN: [id number] DOB: [birth year] Height: 5' 8 (172.7 cm) Weight: 97.6 kg  Insurance Information HMO: yes    PPO:      PCP:      IPA:      80/20:      OTHER:  PRIMARY: [employer]      Policy#: [id number]      Subscriber: patient CM Name: ***      Phone#: ***     Fax#: *** Pre-Cert#: ***      Employer: *** Benefits:  Phone #: ***     Name: *** Eff. Date: ***     Deduct: ***      Out of Pocket Max: ***      Life Max: *** CIR: ***      SNF: *** Outpatient: ***     Co-Pay: *** Home Health: ***      Co-Pay: *** DME: ***     Co-Pay: *** Providers: in-network SECONDARY:       Policy#:      Phone#:   Financial Counselor:       Phone#:   The Best Boy for patients in Inpatient Rehabilitation Facilities with attached Privacy Act Statement-Health Care Records was provided and verbally reviewed with: Patient  Emergency Contact Information Contact Information     Name Relation Home Work Mobile   [place] Mother [phone]  [phone]   Jenny Leach Daughter   [phone]   Margean, Korell   [phone]      Other Contacts   None on File     Current Medical History  Patient Admitting Diagnosis: SAH History of Present Illness: Pt is a 59 year old female with medical hx significant for: CKD, migraines, HTN, hyperlipidemia, lower back pain, rheumatoid arthritis, diabetes. Pt presented to [facility] on 11/27/24 d/t sudden onset of severe headache. Had laser eye surgery for glaucoma 6 weeks prior to presentation. Reported having headaches for past 6 weeks and elevated BP. EMS noted mild left facial droop during route. Code stroke initiated. CT showed bibasilar cisternal subarachnoid hemorrhage. CTA negative for aneurysm. Neurology consulted. Pt started on Cleviprex  drip. Pt transferred to [facility] on 11/27/24 for further  treatment. Neurosurgery consulted. Recommended supportive care d/t suspected venous hemorrhage and low risk for recurrence.*** Therapy evaluations completed and CIR recommended d/t pt's deficits in functional mobility. Complete NIHSS TOTAL: 0  Patient's medical record from [facility] has been reviewed by the rehabilitation admission coordinator and physician.  Past Medical History  Past Medical History:  Diagnosis Date   Anxiety    Arthritis    Chronic foot pain, left    GERD (gastroesophageal reflux disease)    History of anal fissures    Hyperlipidemia    Hypertension    Hypothyroidism    Insulin  dependent type 2 diabetes mellitus (HCC)    endrocrinologist-- dr tommas   Migraines    Peripheral neuropathy    RA (rheumatoid arthritis) [facility])    rheumatologist-  dr mai   SUI (stress urinary incontinence, female)    Wears glasses     Has the patient had major surgery during 100 days prior to admission? No  Family History   family history includes Arrhythmia in her father; COPD in her father; Cancer in her father; Diabetes in her mother and sister; Migraines in her mother.  Current Medications Current Medications[1]  Patients Current Diet:  Diet  Order             Diet Heart Room service appropriate? Yes with Assist; Fluid consistency: Thin  Diet effective now                   Precautions / Restrictions Precautions Precautions: Fall Precaution/Restrictions Comments: SBP<160 Restrictions Weight Bearing Restrictions Per Provider Order: No   Has the patient had 2 or more falls or a fall with injury in the past year? Yes  Prior Activity Level Community (5-7x/wk): drives, gets out of house often  Prior Functional Level Self Care: Did the patient need help bathing, dressing, using the toilet or eating? Independent  Indoor Mobility: Did the patient need assistance with walking from room to room (with or without device)? Independent  Stairs: Did the  patient need assistance with internal or external stairs (with or without device)? Independent  Functional Cognition: Did the patient need help planning regular tasks such as shopping or remembering to take medications? Independent  Patient Information Are you of Hispanic, Latino/a,or Spanish origin?: A. No, not of Hispanic, Latino/a, or Spanish origin What is your race?: A. White Do you need or want an interpreter to communicate with a doctor or health care staff?: 0. No  Patient's Response To:  Health Literacy and Transportation Is the patient able to respond to health literacy and transportation needs?: Yes Health Literacy - How often do you need to have someone help you when you read instructions, pamphlets, or other written material from your doctor or pharmacy?: Never In the past 12 months, has lack of transportation kept you from medical appointments or from getting medications?: No In the past 12 months, has lack of transportation kept you from meetings, work, or from getting things needed for daily living?: No  Home Assistive Devices / Equipment Home Equipment: Cane - single point, Agricultural Consultant (2 wheels), BSC/3in1  Prior Device Use: Indicate devices/aids used by the patient prior to current illness, exacerbation or injury? None of the above  Current Functional Level Cognition  Orientation Level: Oriented X4    Extremity Assessment (includes Sensation/Coordination)  Upper Extremity Assessment: Right hand dominant, Generalized weakness  Lower Extremity Assessment: Defer to PT evaluation    ADLs  Overall ADL's : Needs assistance/impaired Grooming: Contact guard assist, Wash/dry hands, Standing Upper Body Dressing : Set up, Sitting Lower Body Dressing: Minimal assistance, Sit to/from stand Toilet Transfer: Minimal assistance, Ambulation, Rolling walker (2 wheels) Functional mobility during ADLs: Minimal assistance, Rolling walker (2 wheels), Cueing for safety, Cueing for  sequencing    Mobility  Overal bed mobility: Needs Assistance Bed Mobility: Supine to Sit, Sit to Supine Supine to sit: Supervision Sit to supine: Supervision General bed mobility comments: supervision with HOB elevated    Transfers  Overall transfer level: Needs assistance Equipment used: Rolling walker (2 wheels) Transfers: Sit to/from Stand Sit to Stand: Contact guard assist General transfer comment: CGA from EOB and chair and rollator seat in gym, cues for RW    Ambulation / Gait / Stairs / Wheelchair Mobility  Ambulation/Gait Ambulation/Gait assistance: Contact guard assist Gait Distance (Feet): 150 Feet (x2) Assistive device: Rolling walker (2 wheels) Gait Pattern/deviations: Step-through pattern General Gait Details: pt ambulating with a reciprocal gait pattern with RW for support, no overt LOB, cues for attention to task as pt easily distractible Gait velocity: reduced Gait velocity interpretation: <1.31 ft/sec, indicative of household ambulator Stairs: Yes Stairs assistance: Contact guard assist Stair Management: Two rails, Step to pattern, Forwards Number  of Stairs: 4 General stair comments: pt able to ascend/descend 2 steps x2 bouts in therapy gym with bilateral rail support    Posture / Balance Dynamic Sitting Balance Sitting balance - Comments: supervision at eOB Balance Overall balance assessment: Needs assistance Sitting-balance support: No upper extremity supported, Feet supported Sitting balance-Leahy Scale: Fair Sitting balance - Comments: supervision at eOB Standing balance support: Bilateral upper extremity supported, No upper extremity supported, During functional activity Standing balance-Leahy Scale: Poor Standing balance comment: pt with mild posterior LOB stanidng and pulling up briefs with no UE support, pt able to self correct    Special considerations/life events  Skin Ecchymosis: arm/bilateral; Erythema/Redness: arm/bilateral and Diabetic  management Lantus  14 units daily; Novolog  0-5 units daily at bedtime; Novolog  0-9 units 3x daily with meals   Previous Home Environment (from acute therapy documentation) Living Arrangements: Alone Available Help at Discharge: Family, Available 24 hours/day Type of Home: Mobile home Home Layout: One level Home Access: Stairs to enter Entrance Stairs-Rails: Left Entrance Stairs-Number of Steps: 5 Bathroom Shower/Tub: Health Visitor: Standard Bathroom Accessibility: Yes How Accessible: Accessible via walker Home Care Services: No Additional Comments: staying with mom at dc as above  Discharge Living Setting Plans for Discharge Living Setting: Mobile Home (going to stay with mother) Type of Home at Discharge: Mobile home Discharge Home Layout: One level Discharge Home Access: Stairs to enter Entrance Stairs-Rails: Can reach both Entrance Stairs-Number of Steps: 4 Discharge Bathroom Shower/Tub: Walk-in shower Discharge Bathroom Toilet: Standard Discharge Bathroom Accessibility: Yes How Accessible: Accessible via walker Does the patient have any problems obtaining your medications?: No  Social/Family/Support Systems Anticipated Caregiver: Joen Pouch, mother Anticipated Caregiver's Contact Information: [phone] Caregiver Availability: 24/7 Discharge Plan Discussed with Primary Caregiver: Yes Is Caregiver In Agreement with Plan?: Yes Does Caregiver/Family have Issues with Lodging/Transportation while Pt is in Rehab?: No  Goals Patient/Family Goal for Rehab: *** Expected length of stay: *** Pt/Family Agrees to Admission and willing to participate: Yes Program Orientation Provided & Reviewed with Pt/Caregiver Including Roles  & Responsibilities: Yes  Decrease burden of Care through IP rehab admission: NA  Possible need for SNF placement upon discharge: Not anticipated  Patient Condition: I have reviewed medical records from [facility], spoken  with CM, and patient and family member. I met with patient at the bedside for inpatient rehabilitation assessment.  Patient will benefit from ongoing PT and OT, can actively participate in 3 hours of therapy a day 5 days of the week, and can make measurable gains during the admission.  Patient will also benefit from the coordinated team approach during an Inpatient Acute Rehabilitation admission.  The patient will receive intensive therapy as well as Rehabilitation physician, nursing, social worker, and care management interventions.  Due to safety, skin/wound care, disease management, medication administration, pain management, and patient education the patient requires 24 hour a day rehabilitation nursing.  The patient is currently *** with mobility and basic ADLs.  Discharge setting and therapy post discharge at home with home health is anticipated.  Patient has agreed to participate in the Acute Inpatient Rehabilitation Program and will admit {Time; today/tomorrow:10263}.  Preadmission Screen Completed By:  Tinnie SHAUNNA Yvone Delayne, 12/02/2024 12:34 PM ______________________________________________________________________   Discussed status with Dr. PIERRETTE on *** at *** and received approval for admission today.  Admission Coordinator:  Tinnie SHAUNNA Yvone Delayne, CCC-SLP, time ***/Date ***   Assessment/Plan: Diagnosis: *** Does the need for close, 24 hr/day Medical supervision in concert with the patient's  rehab needs make it unreasonable for this patient to be served in a less intensive setting? {yes_no_potentially:3041433} Co-Morbidities requiring supervision/potential complications: *** Due to {due un:6958565}, does the patient require 24 hr/day rehab nursing? {yes_no_potentially:3041433} Does the patient require coordinated care of a physician, rehab nurse, PT, OT, and SLP to address physical and functional deficits in the context of the above medical diagnosis(es)?  {yes_no_potentially:3041433} Addressing deficits in the following areas: {deficits:3041436} Can the patient actively participate in an intensive therapy program of at least 3 hrs of therapy 5 days a week? {yes_no_potentially:3041433} The potential for patient to make measurable gains while on inpatient rehab is {potential:3041437} Anticipated functional outcomes upon discharge from inpatient rehab: {functional outcomes:304600100} PT, {functional outcomes:304600100} OT, {functional outcomes:304600100} SLP Estimated rehab length of stay to reach the above functional goals is: *** Anticipated discharge destination: {anticipated dc setting:21604} 10. Overall Rehab/Functional Prognosis: {potential:3041437}   MD Signature: ***     [1]  Current Facility-Administered Medications:    acetaminophen  (TYLENOL ) tablet 1,000 mg, 1,000 mg, Oral, TID, Amin, Ankit C, MD, 1,000 mg at 12/02/24 1010   ALPRAZolam  (XANAX ) tablet 1 mg, 1 mg, Oral, TID, Gretta Leita SQUIBB, DO, 1 mg at 11/29/24 1205   amLODipine  (NORVASC ) tablet 10 mg, 10 mg, Oral, Daily, Gretta Leita P, DO, 10 mg at 12/02/24 0845   butalbital -acetaminophen -caffeine  (FIORICET ) 50-325-40 MG per tablet 2 tablet, 2 tablet, Oral, Q6H, Hunsucker, Donnice SAUNDERS, MD, 2 tablet at 12/02/24 1217   Chlorhexidine  Gluconate Cloth 2 % PADS 6 each, 6 each, Topical, Daily, Layman Raisin, DO, 6 each at 11/29/24 0907   cloNIDine  (CATAPRES ) tablet 0.2 mg, 0.2 mg, Oral, BID, Amin, Ankit C, MD, 0.2 mg at 12/02/24 0848   cyclobenzaprine  (FLEXERIL ) tablet 5 mg, 5 mg, Oral, TID PRN, Amin, Ankit C, MD, 5 mg at 12/02/24 0845   DULoxetine  (CYMBALTA ) DR capsule 60 mg, 60 mg, Oral, BID, Hunsucker, Donnice SAUNDERS, MD, 60 mg at 12/02/24 0845   enoxaparin  (LOVENOX ) injection 40 mg, 40 mg, Subcutaneous, Q24H, Lester Golas, MD, 40 mg at 12/01/24 2222   fenofibrate  tablet 54 mg, 54 mg, Oral, Daily, Amin, Ankit C, MD, 54 mg at 12/02/24 1011   fentaNYL  (SUBLIMAZE ) injection 50 mcg, 50 mcg,  Intravenous, Q2H PRN, Hunsucker, Donnice SAUNDERS, MD, 50 mcg at 12/02/24 0436   gabapentin  (NEURONTIN ) capsule 400 mg, 400 mg, Oral, TID, Hunsucker, Donnice SAUNDERS, MD, 400 mg at 12/02/24 9156   hydrochlorothiazide  (HYDRODIURIL ) tablet 25 mg, 25 mg, Oral, Daily, Hunsucker, Donnice SAUNDERS, MD, 25 mg at 12/02/24 0845   hydroxychloroquine  (PLAQUENIL ) tablet 400 mg, 400 mg, Oral, Daily, Hunsucker, Donnice SAUNDERS, MD, 400 mg at 12/02/24 1011   insulin  aspart (novoLOG ) injection 0-5 Units, 0-5 Units, Subcutaneous, QHS, Opyd, Timothy S, MD, 2 Units at 12/01/24 2225   insulin  aspart (novoLOG ) injection 0-9 Units, 0-9 Units, Subcutaneous, TID WC, Opyd, Timothy S, MD, 2 Units at 12/02/24 9370   insulin  glargine (LANTUS ) injection 14 Units, 14 Units, Subcutaneous, Daily, Amin, Ankit C, MD, 14 Units at 12/02/24 1011   irbesartan  (AVAPRO ) tablet 300 mg, 300 mg, Oral, Daily, Hunsucker, Donnice SAUNDERS, MD, 300 mg at 12/02/24 0845   labetalol  (NORMODYNE ) injection 20 mg, 20 mg, Intravenous, Q4H PRN, Gretta Leita P, DO   leflunomide  (ARAVA ) tablet 20 mg, 20 mg, Oral, Daily, Hunsucker, Donnice SAUNDERS, MD, 20 mg at 12/02/24 1010   levothyroxine  (SYNTHROID ) tablet 50 mcg, 50 mcg, Oral, Q0600, Hunsucker, Donnice SAUNDERS, MD, 50 mcg at 12/02/24 0629   lidocaine  (LIDODERM ) 5 % 1 patch,  1 patch, Transdermal, Q24H, Hunsucker, Donnice SAUNDERS, MD, 1 patch at 12/02/24 0845   metoprolol  tartrate (LOPRESSOR ) tablet 50 mg, 50 mg, Oral, BID, Gretta Leita SQUIBB, DO, 50 mg at 12/02/24 0845   ondansetron  (ZOFRAN ) injection 4 mg, 4 mg, Intravenous, Q6H PRN, Jerri Pfeiffer, MD, 4 mg at 12/02/24 9567   oxyCODONE  (Oxy IR/ROXICODONE ) immediate release tablet 5-10 mg, 5-10 mg, Oral, Q4H PRN, Amin, Ankit C, MD, 10 mg at 12/02/24 0843   polyethylene glycol (MIRALAX  / GLYCOLAX ) packet 17 g, 17 g, Oral, Daily PRN, Layman Raisin, DO   senna (SENOKOT) tablet 8.6 mg, 1 tablet, Oral, BID PRN, Layman Raisin, DO

## 2024-12-02 NOTE — Progress Notes (Signed)
 SPIRITUAL CARE AND COUNSELING CONSULT NOTE   VISIT SUMMARY Chaplain provided education on AD paperwork. AD paperwork was left with sister who informed chaplain she would give it to the Pt's daughters and son.  SPIRITUAL ENCOUNTER                                                                                                                                                                      Type of Visit: Initial Care provided to:: Pt and family Referral source: Nurse (RN/NT/LPN) Reason for visit: Advance directives OnCall Visit: No   SPIRITUAL FRAMEWORK  Presenting Themes: Impactful experiences and emotions Community/Connection: Family Patient Stress Factors: Health changes Family Stress Factors: Health changes   GOALS      INTERVENTIONS   Spiritual Care Interventions Made: Compassionate presence, Established relationship of care and support    INTERVENTION OUTCOMES   Outcomes: Awareness of support, Awareness of health  SPIRITUAL CARE PLAN     Family will contact chaplain services once Advance Directive is complete.   If immediate needs arise,    [name], Chaplain  12/02/2024 11:04 AM

## 2024-12-02 NOTE — TOC Progression Note (Signed)
 Transition of Care [facility]) - Progression Note    Patient Details  Name: Amy Adams MRN: [id number] Date of Birth: [birth year]  [facility] CM/SW Contact  Andrez JULIANNA George, RN Phone Number: 12/02/2024, 9:40 AM  Clinical Narrative:     Per MD pt has decided she will attend CIR. CM has updated Lauren with IR.  IP Care management following.  Expected Discharge Plan: OP Rehab Barriers to Discharge: Continued Medical Work up               Expected Discharge Plan and Services       Living arrangements for the past 2 months: Single Family Home                                       Social Drivers of Health (SDOH) Interventions SDOH Screenings   Food Insecurity: No Food Insecurity (11/28/2024)  Housing: Low Risk (11/28/2024)  Transportation Needs: No Transportation Needs (11/28/2024)  Utilities: Not At Risk (11/28/2024)  Depression (PHQ2-9): Low Risk (05/27/2024)  Tobacco Use: Low Risk (11/29/2024)    Readmission Risk Interventions     No data to display

## 2024-12-02 NOTE — Progress Notes (Signed)
 OK to proceed with CTA without new labs.

## 2024-12-02 NOTE — Progress Notes (Signed)
 Occupational Therapy Treatment Patient Details Name: Amy Adams MRN: [id number] DOB: [birth year] Today's Date: 12/02/2024   History of present illness 58 y.o. female presents to [facility] hospital on 11/27/2024 with headache and pain posterior to R eye since laser eye surgery 6 weeks ago. On 1/14 she then developed neck spasms and facial numbness and L droop. 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 PMH includes HTN, CKD, HLD, glaucoma, DMII, OA, chronic low back pain, RLS.   OT comments  Pt remains limited by persistent headache but agreeable for OT session. Pt able to manage UB bathing/dressing with Setup Assist and LB ADLs with overall Min A. Pt with LOB in standing without UE support during LB ADLs requiring Min A to correct. Pt reports some improvement in diplopia with and without occlusion glasses though does endorse shadows, varying boldness of print when attempting to read. Educated pt on current deficits and high fall risk; encouraged consideration of intensive rehab services to maximize independence and safety with daily tasks to avoid injury for her or family at home. Pt receptive to consider inpatient rehab.      If plan is discharge home, recommend the following:  A little help with walking and/or transfers;A little help with bathing/dressing/bathroom;Assistance with cooking/housework;Direct supervision/assist for medications management;Direct supervision/assist for financial management;Assist for transportation;Help with stairs or ramp for entrance   Equipment Recommendations  Tub/shower seat    Recommendations for Other Services Rehab consult    Precautions / Restrictions Precautions Precautions: Fall Recall of Precautions/Restrictions: Impaired Precaution/Restrictions Comments: SBP<160 Restrictions Weight Bearing Restrictions Per Provider Order: No       Mobility Bed Mobility Overal bed mobility: Needs Assistance Bed Mobility: Supine to Sit, Sit to  Supine     Supine to sit: Supervision Sit to supine: Supervision        Transfers Overall transfer level: Needs assistance Equipment used: None Transfers: Sit to/from Stand Sit to Stand: Contact guard assist                 Balance Overall balance assessment: Needs assistance Sitting-balance support: No upper extremity supported, Feet supported Sitting balance-Leahy Scale: Fair     Standing balance support: No upper extremity supported, During functional activity Standing balance-Leahy Scale: Poor                             ADL either performed or assessed with clinical judgement   ADL Overall ADL's : Needs assistance/impaired     Grooming: Set up;Sitting;Supervision/safety;Brushing hair;Wash/dry face   Upper Body Bathing: Set up;Sitting   Lower Body Bathing: Minimal assistance;Sitting/lateral leans;Sit to/from stand   Upper Body Dressing : Set up;Sitting   Lower Body Dressing: Minimal assistance;Sit to/from stand Lower Body Dressing Details (indicate cue type and reason): assist for changing mesh underwear. One LOB in standingfor LB ADLs with assist to correct                    Extremity/Trunk Assessment Upper Extremity Assessment Upper Extremity Assessment: Overall WFL for tasks assessed   Lower Extremity Assessment Lower Extremity Assessment: Defer to PT evaluation        Vision   Vision Assessment?: Wears glasses for reading;Yes Additional Comments: Pt reports blurriness and feeling like her R eye is being sucked backwards into her head. with occluded glasses, pt denies double vision but reports seeing some shadows and print looking abstract. able to read items on whiteboard and pt  pamplet   Perception     Praxis     Communication Communication Communication: No apparent difficulties   Cognition Arousal: Alert Behavior During Therapy: WFL for tasks assessed/performed Cognition: Cognition impaired     Awareness:  Online awareness impaired   Attention impairment (select first level of impairment): Selective attention Executive functioning impairment (select all impairments): Problem solving OT - Cognition Comments: pleasant, some decreased insight into deficits but receptive to education on areas to address. some attention deficits and can be quick with movements                 Following commands: Impaired Following commands impaired: Follows one step commands with increased time      Cueing   Cueing Techniques: Verbal cues  Exercises      Shoulder Instructions       General Comments Sister at bedside and supportive    Pertinent Vitals/ Pain       Pain Assessment Pain Assessment: Faces Faces Pain Scale: Hurts whole lot Pain Location: headache Pain Descriptors / Indicators: Headache Pain Intervention(s): Monitored during session, Premedicated before session  Home Living           Entrance Stairs-Number of Steps: 5 Entrance Stairs-Rails: Left             Bathroom Accessibility: Yes How Accessible: Accessible via walker            Prior Functioning/Environment              Frequency  Min 2X/week        Progress Toward Goals  OT Goals(current goals can now be found in the care plan section)  Progress towards OT goals: Progressing toward goals  Acute Rehab OT Goals Patient Stated Goal: for head to stop hurting OT Goal Formulation: With patient/family Time For Goal Achievement: 12/14/24 Potential to Achieve Goals: Good ADL Goals Pt Will Perform Grooming: with modified independence;standing Pt Will Perform Lower Body Dressing: with modified independence;sit to/from stand;sitting/lateral leans Pt Will Transfer to Toilet: with modified independence;ambulating;bedside commode Pt Will Perform Toileting - Clothing Manipulation and hygiene: with modified independence;sitting/lateral leans;sit to/from stand Additional ADL Goal #1: Pt will utilize visual  compensatory techniques to optimize independnece and safety during ADLs with independence. Additional ADL Goal #2: Pt will complete pill box test with independnece.  Plan      Co-evaluation                 AM-PAC OT 6 Clicks Daily Activity     Outcome Measure   Help from another person eating meals?: None Help from another person taking care of personal grooming?: A Little Help from another person toileting, which includes using toliet, bedpan, or urinal?: A Lot Help from another person bathing (including washing, rinsing, drying)?: A Little Help from another person to put on and taking off regular upper body clothing?: A Little Help from another person to put on and taking off regular lower body clothing?: A Little 6 Click Score: 18    End of Session    OT Visit Diagnosis: Other abnormalities of gait and mobility (R26.89);Muscle weakness (generalized) (M62.81);Pain;Other symptoms and signs involving the nervous system (R29.898)   Activity Tolerance Patient tolerated treatment well   Patient Left in bed;with call bell/phone within reach;with bed alarm set;with family/visitor present   Nurse Communication Mobility status        Time: 1245-1319 OT Time Calculation (min): 34 min  Charges: OT General Charges $OT Visit: 1 Visit OT Treatments $  Self Care/Home Management : 23-37 mins  Mliss NOVAK, OTR/L Acute Rehab Services Office: [phone]   Mliss Fish 12/02/2024, 2:12 PM

## 2024-12-02 NOTE — Progress Notes (Signed)
 Inpatient Rehab Admissions Coordinator:  Notified by [facility] that pt changed her mind and would like to pursue CIR. Saw pt and sister, Montie, at bedside. Reviewed CIR goals and expectations. Pt and Montie acknowledged understanding. Pt verified that she is interested in pursuing CIR. Pt and Montie confirmed that family will be able to provide 24/7 support after discharge. Will continue to follow.   Tinnie Yvone Cohens, MS, CCC-SLP Admissions Coordinator [phone]

## 2024-12-02 NOTE — Plan of Care (Signed)
" °  Problem: Education: Goal: Knowledge of General Education information will improve Description: Including pain rating scale, medication(s)/side effects and non-pharmacologic comfort measures Outcome: Progressing   Problem: Nutrition: Goal: Adequate nutrition will be maintained Outcome: Progressing   Problem: Elimination: Goal: Will not experience complications related to bowel motility Outcome: Progressing   Problem: Safety: Goal: Ability to remain free from injury will improve Outcome: Progressing   Problem: Clinical Measurements: Goal: Will remain free from infection Outcome: Progressing   "

## 2024-12-02 NOTE — Progress Notes (Signed)
 Physical Therapy Treatment Patient Details Name: Amy Adams MRN: [id number] DOB: Oct 27, 1966 Today's Date: 12/02/2024   History of Present Illness 59 y.o. female presents to [facility] hospital on 11/27/2024 with headache and pain posterior to R eye since laser eye surgery 6 weeks ago. On 1/14 she then developed neck spasms and facial numbness and L droop. 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 PMH includes HTN, CKD, HLD, glaucoma, DMII, OA, chronic low back pain, RLS.    PT Comments  Pt with fair tolerance to treatment today. Pt able to ambulate in hallway with RW CGA/Min A however session today was limited due to dizziness and fatigue. Attempted to perform DGI today with pt only completing half of the test before requesting to return to room. Upon returning to room, pt requesting to use bathroom where she had 1 major loss of balance after tripping over ledge requiring Max/Total A to correct and prevent fall. Despite this, pt remains a great candidate for AIR. No change in DC/DME recs at this time. PT will continue to follow.     If plan is discharge home, recommend the following: A lot of help with walking and/or transfers;A lot of help with bathing/dressing/bathroom;Assistance with cooking/housework;Assist for transportation;Help with stairs or ramp for entrance;Supervision due to cognitive status   Can travel by private vehicle        Equipment Recommendations  Rolling walker (2 wheels);BSC/3in1    Recommendations for Other Services       Precautions / Restrictions Precautions Precautions: Fall Recall of Precautions/Restrictions: Impaired Precaution/Restrictions Comments: SBP<160 Restrictions Weight Bearing Restrictions Per Provider Order: No     Mobility  Bed Mobility Overal bed mobility: Needs Assistance Bed Mobility: Supine to Sit, Sit to Supine     Supine to sit: Supervision Sit to supine: Supervision   General bed mobility comments: supervision  with HOB elevated    Transfers Overall transfer level: Needs assistance Equipment used: Rolling walker (2 wheels) Transfers: Sit to/from Stand Sit to Stand: Contact guard assist           General transfer comment: CGA for safety. Good hand placement.    Ambulation/Gait Ambulation/Gait assistance: Contact guard assist, Min assist, Max assist, Total assist Gait Distance (Feet): 150 Feet Assistive device: Rolling walker (2 wheels) Gait Pattern/deviations: Step-through pattern, Decreased stride length, Drifts right/left Gait velocity: reduced Gait velocity interpretation: <1.8 ft/sec, indicate of risk for recurrent falls   General Gait Details: Pt with recripocal gait pattern however reports feeling woozy from pain meds. Once fatigued pt requiring up to Min A for ambulation. 2 LOB noted with 1 major LOB entering bathroom with pt tripping over ledge to enter bathroom requiring Max/Total to correct.   Stairs Stairs: Yes Stairs assistance: Contact guard assist Stair Management: Two rails, Step to pattern, Forwards Number of Stairs: 2 General stair comments: Part of DGI however pt unable to finish test due to dizziness and fatigue.   Wheelchair Mobility     Tilt Bed    Modified Rankin (Stroke Patients Only) Modified Rankin (Stroke Patients Only) Pre-Morbid Rankin Score: No symptoms Modified Rankin: Moderately severe disability     Balance Overall balance assessment: Needs assistance Sitting-balance support: No upper extremity supported, Feet supported Sitting balance-Leahy Scale: Fair     Standing balance support: No upper extremity supported, During functional activity Standing balance-Leahy Scale: Poor                   Standardized Balance Assessment Standardized Balance Assessment :  Dynamic Gait Index   Dynamic Gait Index Level Surface: Mild Impairment Change in Gait Speed: Moderate Impairment (Clinical reason) Gait with Horizontal Head Turns:  Moderate Impairment (clinical reason) Gait with Vertical Head Turns: Moderate Impairment (clinical reason) Gait and Pivot Turn: Moderate Impairment Step Over Obstacle: Moderate Impairment Step Around Obstacles: Mild Impairment Steps: Mild Impairment Total Score: 11      Communication Communication Communication: No apparent difficulties  Cognition Arousal: Alert Behavior During Therapy: WFL for tasks assessed/performed                             Following commands: Impaired Following commands impaired: Follows one step commands with increased time    Cueing Cueing Techniques: Verbal cues  Exercises      General Comments General comments (skin integrity, edema, etc.): Sister at bedside.      Pertinent Vitals/Pain Pain Assessment Pain Assessment: Faces Faces Pain Scale: Hurts whole lot Pain Location: headache Pain Descriptors / Indicators: Headache Pain Intervention(s): Premedicated before session, Monitored during session, Limited activity within patient's tolerance    Home Living           Entrance Stairs-Rails: Left Entrance Stairs-Number of Steps: 5            Prior Function            PT Goals (current goals can now be found in the care plan section) Progress towards PT goals: Progressing toward goals    Frequency    Min 3X/week      PT Plan      Co-evaluation              AM-PAC PT 6 Clicks Mobility   Outcome Measure  Help needed turning from your back to your side while in a flat bed without using bedrails?: A Little Help needed moving from lying on your back to sitting on the side of a flat bed without using bedrails?: A Little Help needed moving to and from a bed to a chair (including a wheelchair)?: A Little Help needed standing up from a chair using your arms (e.g., wheelchair or bedside chair)?: A Little Help needed to walk in hospital room?: A Little Help needed climbing 3-5 steps with a railing? : A Little 6  Click Score: 18    End of Session Equipment Utilized During Treatment: Gait belt Activity Tolerance: Patient tolerated treatment well Patient left: in bed;with call bell/phone within reach;with family/visitor present;with bed alarm set Nurse Communication: Mobility status PT Visit Diagnosis: Other abnormalities of gait and mobility (R26.89);Difficulty in walking, not elsewhere classified (R26.2);Other symptoms and signs involving the nervous system (R29.898)     Time: 8590-8565 PT Time Calculation (min) (ACUTE ONLY): 25 min  Charges:    $Gait Training: 8-22 mins $Therapeutic Activity: 8-22 mins PT General Charges $$ ACUTE PT VISIT: 1 Visit                     Sueellen NOVAK, PT, DPT Acute Rehab Services [phone]    Myers Tutterow 12/02/2024, 2:43 PM

## 2024-12-02 NOTE — Progress Notes (Signed)
 Day 5 after admission for perimesencephalic hemorrhage.  She says her headache is worse.  She feels pressure and pain behind the right eye.  She is alert and oriented.  Her language function is normal.  There is no dysarthria.  Her visual fields are full in both eyes.  Her face is symmetric.  Strength and sensation are normal in the arms, hands and legs.  No sensory deficit or neglect.  Assessment:  Day 5 after perimesencephalic hemorrhage.  I think her headache is in the realm of what is expected, but she does feel like it is worse.  Recommendation:  Will repeat a CT and check a CTA to be certain she does not have significant spasm.

## 2024-12-03 DIAGNOSIS — I609 Nontraumatic subarachnoid hemorrhage, unspecified: Secondary | ICD-10-CM | POA: Diagnosis not present

## 2024-12-03 LAB — GLUCOSE, CAPILLARY
Glucose-Capillary: 128 mg/dL — ABNORMAL HIGH (ref 70–99)
Glucose-Capillary: 150 mg/dL — ABNORMAL HIGH (ref 70–99)
Glucose-Capillary: 129 mg/dL — ABNORMAL HIGH (ref 70–99)
Glucose-Capillary: 191 mg/dL — ABNORMAL HIGH (ref 70–99)

## 2024-12-03 MED ORDER — NIMODIPINE 30 MG PO CAPS
60.0000 mg | ORAL_CAPSULE | ORAL | Status: DC
  Administered 2024-12-03 – 2024-12-05 (×14): 60 mg via ORAL
  Filled 2024-12-03 (×16): qty 2

## 2024-12-03 MED ORDER — CLONIDINE HCL 0.1 MG PO TABS
0.2000 mg | ORAL_TABLET | Freq: Three times a day (TID) | ORAL | Status: DC
  Administered 2024-12-03 – 2024-12-05 (×7): 0.2 mg via ORAL
  Filled 2024-12-03 (×7): qty 2

## 2024-12-03 MED ORDER — PANTOPRAZOLE SODIUM 40 MG PO TBEC
40.0000 mg | DELAYED_RELEASE_TABLET | Freq: Every day | ORAL | Status: DC
  Administered 2024-12-03 – 2024-12-05 (×3): 40 mg via ORAL
  Filled 2024-12-03 (×3): qty 1

## 2024-12-03 NOTE — Progress Notes (Signed)
 Inpatient Rehab Admissions Coordinator:   CIR following. Case sent to insurance this AM.  Leita Kleine, MS, CCC-SLP Rehab Admissions Coordinator  [phone] (celll) [phone] (office)

## 2024-12-03 NOTE — Progress Notes (Signed)
 Physical Therapy Treatment Patient Details Name: Amy Adams MRN: [id number] DOB: 12-19-1965 Today's Date: 12/03/2024   History of Present Illness 59 y.o. female presents to [facility] hospital on 11/27/2024 with headache and pain posterior to R eye since laser eye surgery 6 weeks ago. On 1/14 she then developed neck spasms and facial numbness and L droop. 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 PMH includes HTN, CKD, HLD, glaucoma, DMII, OA, chronic low back pain, RLS.    PT Comments  Pt with poor tolerance to treatment today. Pt reporting increased headache, dizziness, and photophobia compared to previous session particular when out in hallway with ambulation. Pt able to ambulate short distance in hallway with RW CGA/Min A. Upon returning to room requesting assistance with toileting. No change in DC/DME recs at this time. PT will continue to follow.     If plan is discharge home, recommend the following: A lot of help with walking and/or transfers;A lot of help with bathing/dressing/bathroom;Assistance with cooking/housework;Assist for transportation;Help with stairs or ramp for entrance;Supervision due to cognitive status   Can travel by private vehicle        Equipment Recommendations  Rolling walker (2 wheels);BSC/3in1    Recommendations for Other Services       Precautions / Restrictions Precautions Precautions: Fall Recall of Precautions/Restrictions: Impaired Precaution/Restrictions Comments: SBP<160 Restrictions Weight Bearing Restrictions Per Provider Order: No     Mobility  Bed Mobility Overal bed mobility: Needs Assistance Bed Mobility: Supine to Sit, Sit to Supine     Supine to sit: Supervision Sit to supine: Supervision   General bed mobility comments: supervision with HOB elevated    Transfers Overall transfer level: Needs assistance Equipment used: Rolling walker (2 wheels) Transfers: Sit to/from Stand Sit to Stand: Contact guard  assist           General transfer comment: CGA for safety. Good hand placement.    Ambulation/Gait Ambulation/Gait assistance: Contact guard assist, Min assist Gait Distance (Feet): 50 Feet Assistive device: Rolling walker (2 wheels) Gait Pattern/deviations: Step-through pattern, Decreased stride length, Drifts right/left Gait velocity: reduced Gait velocity interpretation: <1.8 ft/sec, indicate of risk for recurrent falls   General Gait Details: Ambulation today limited by pt reporting increased headache and dizziness. Pt also report light sensitivity therefore once in hallway requesting to go back. Initially CGA however up to Min A with fatigue with reciprocal gait pattern.   Stairs             Wheelchair Mobility     Tilt Bed    Modified Rankin (Stroke Patients Only) Modified Rankin (Stroke Patients Only) Pre-Morbid Rankin Score: No symptoms Modified Rankin: Moderately severe disability     Balance Overall balance assessment: Needs assistance Sitting-balance support: No upper extremity supported, Feet supported Sitting balance-Leahy Scale: Fair     Standing balance support: No upper extremity supported, During functional activity Standing balance-Leahy Scale: Poor Standing balance comment: Reliant on RW                            Communication Communication Communication: No apparent difficulties  Cognition Arousal: Alert Behavior During Therapy: WFL for tasks assessed/performed   PT - Cognitive impairments: Awareness, Memory, Problem solving, Safety/Judgement                         Following commands: Impaired Following commands impaired: Follows one step commands with increased time  Cueing Cueing Techniques: Verbal cues  Exercises      General Comments General comments (skin integrity, edema, etc.): VSS      Pertinent Vitals/Pain Pain Assessment Pain Assessment: Faces Faces Pain Scale: Hurts whole lot Pain  Location: headache Pain Descriptors / Indicators: Headache Pain Intervention(s): Limited activity within patient's tolerance, Repositioned, Premedicated before session, Monitored during session    Home Living                          Prior Function            PT Goals (current goals can now be found in the care plan section) Progress towards PT goals: Progressing toward goals    Frequency    Min 3X/week      PT Plan      Co-evaluation              AM-PAC PT 6 Clicks Mobility   Outcome Measure  Help needed turning from your back to your side while in a flat bed without using bedrails?: A Little Help needed moving from lying on your back to sitting on the side of a flat bed without using bedrails?: A Little Help needed moving to and from a bed to a chair (including a wheelchair)?: A Little Help needed standing up from a chair using your arms (e.g., wheelchair or bedside chair)?: A Little Help needed to walk in hospital room?: A Little Help needed climbing 3-5 steps with a railing? : A Little 6 Click Score: 18    End of Session Equipment Utilized During Treatment: Gait belt Activity Tolerance: Patient tolerated treatment well Patient left: in bed;with call bell/phone within reach;with family/visitor present;with bed alarm set Nurse Communication: Mobility status PT Visit Diagnosis: Other abnormalities of gait and mobility (R26.89);Difficulty in walking, not elsewhere classified (R26.2);Other symptoms and signs involving the nervous system (R29.898)     Time: 8666-8645 PT Time Calculation (min) (ACUTE ONLY): 21 min  Charges:    $Gait Training: 8-22 mins PT General Charges $$ ACUTE PT VISIT: 1 Visit                     Sueellen NOVAK, PT, DPT Acute Rehab Services [phone]    Rayneisha Bouza 12/03/2024, 4:22 PM

## 2024-12-03 NOTE — Plan of Care (Signed)
   Problem: Coping: Goal: Level of anxiety will decrease Outcome: Progressing

## 2024-12-03 NOTE — Progress Notes (Signed)
 I reviewed her CT and CTA from last night.  Hemorrhage is stable, getting smaller.  There is mild vasospasm.  She is alert.  Complains of headache as before with photophobia and neck pain, expected with the subarachnoid hemorrhage.  No subjective neurologic deficits. Alert and oriented with normal speech expression, fluency and comprehension. Visual fields are full to confrontation.   Face is symmetric. Strength in the arms and legs is symmetric with no drift.  Sensation is normal and symmetric. No ataxia. No inattention.   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  Recommendation:  I started no motor pain due to the spasm.  Would continue hospital observation at least for another 48 hours.  I will follow daily.

## 2024-12-03 NOTE — Plan of Care (Signed)
" °  Problem: Coping: Goal: Level of anxiety will decrease 12/03/2024 0715 by Almarie Alberteen SQUIBB, RN Outcome: Progressing 12/03/2024 0715 by Almarie Alberteen SQUIBB, RN Outcome: Progressing   "

## 2024-12-03 NOTE — Progress Notes (Signed)
 " PROGRESS NOTE    Amy Adams  FMW:[id number] DOB: 1966-03-18 DOA: 11/27/2024 PCP: Arloa Elsie SAUNDERS, MD    Brief Narrative:   59 year old with history of neck spasm, lower extremity numbness, nausea, left facial droop reported of having laser eye surgery 6 weeks ago for glaucoma thereafter continued right sided eye pain and headache.  There after 48 hours prior to admission started experiencing blurry vision in the right eye therefore came to the hospital.  CT of the head found to have bibasilar cisternal subarachnoid hemorrhage and elevated systolic blood pressure.  She was admitted to the ICU and started on antihypertensive drip.  CTA was negative for aneurysm.  She was also started on Keppra  for prophylaxis. Persistent headache, repeated CT head on 1/19 showing some concerns of vasospasm of by basilar artery.  Neuro IR is following.  Assessment & Plan:  Subarachnoid hemorrhage Persistent headaches - Neuro IR is following the patient.  Repeat CTA of the head showing some vasospasm of by basilar artery. - Echocardiogram showing preserved EF - PT/OT CIR -Tylenol  and oxycodone  ordered  Vasovagal - Isolated episode on 1/18  Hypertensive emergency History of essential hypertension - Currently on Norvasc , HCTZ, ARB, Lopressor .  Increase clonidine  recently started following outpatient with nephrology for better blood pressure control - IV as needed  Headache, nausea and vomiting - Supportive care  Diabetes mellitus type 2 Peripheral neuropathy - Sliding scale and Accu-Chek.  A1c 6.7 - Gabapentin   CKD stage IIIa - Creatinine around baseline of 1.1  Rheumatoid arthritis of chronic immunosuppressive's - Plaquenil   Hypothyroidism -Synthroid   Anxiety/depression - On Xanax , Cymbalta   GERD - PPI   DVT prophylaxis: enoxaparin  (LOVENOX ) injection 40 mg Start: 11/29/24 2200 SCDs Start: 11/27/24 2353      Code Status: Full Code Family Communication:   Cleared by neuro  IR, she will need CIR.  She is agreeable to CIR   PT Follow up Recs: Acute Inpatient Rehab (3hours/Day)11/30/2024 1455  Subjective: Still having headache. Reporting of heartburn therefore started PPI  Examination:  General exam: Appears calm and comfortable  Respiratory system: Clear to auscultation. Respiratory effort normal. Cardiovascular system: S1 & S2 heard, RRR. No JVD, murmurs, rubs, gallops or clicks. No pedal edema. Gastrointestinal system: Abdomen is nondistended, soft and nontender. No organomegaly or masses felt. Normal bowel sounds heard. Central nervous system: Alert and oriented. No focal neurological deficits. Extremities: Symmetric 5 x 5 power. Skin: No rashes, lesions or ulcers Psychiatry: Judgement and insight appear normal. Mood & affect appropriate.                Diet Orders (From admission, onward)     Start     Ordered   11/28/24 1002  Diet Heart Room service appropriate? Yes with Assist; Fluid consistency: Thin  Diet effective now       Question Answer Comment  Room service appropriate? Yes with Assist   Fluid consistency: Thin      11/28/24 1001            Objective: Vitals:   12/02/24 2011 12/02/24 2349 12/03/24 0317 12/03/24 0809  BP: (!) 154/78 (!) 112/55 (!) 167/75 (!) 156/73  Pulse: 71 61 72 73  Resp: 16 14 16 15   Temp: 98.4 F (36.9 C) 98.3 F (36.8 C) 98.6 F (37 C) 98 F (36.7 C)  TempSrc: Oral Oral Oral Oral  SpO2: 96%  97% 99%  Weight:      Height:       No intake  or output data in the 24 hours ending 12/03/24 1023 Filed Weights   11/29/24 0500 11/30/24 0500 12/02/24 0500  Weight: 94.1 kg 96.6 kg 97.6 kg    Scheduled Meds:  acetaminophen   1,000 mg Oral TID   ALPRAZolam   1 mg Oral TID   amLODipine   10 mg Oral Daily   butalbital -acetaminophen -caffeine   2 tablet Oral Q6H   Chlorhexidine  Gluconate Cloth  6 each Topical Daily   cloNIDine   0.2 mg Oral TID   DULoxetine   60 mg Oral BID   enoxaparin  (LOVENOX )  injection  40 mg Subcutaneous Q24H   fenofibrate   54 mg Oral Daily   gabapentin   400 mg Oral TID   hydrochlorothiazide   25 mg Oral Daily   hydroxychloroquine   400 mg Oral Daily   insulin  aspart  0-5 Units Subcutaneous QHS   insulin  aspart  0-9 Units Subcutaneous TID WC   insulin  glargine  14 Units Subcutaneous Daily   irbesartan   300 mg Oral Daily   leflunomide   20 mg Oral Daily   levothyroxine   50 mcg Oral Q0600   lidocaine   1 patch Transdermal Q24H   metoprolol  tartrate  50 mg Oral BID   niMODipine   60 mg Oral Q4H   pantoprazole   40 mg Oral QAC breakfast   Continuous Infusions:  Nutritional status     Body mass index is 32.72 kg/m.  Data Reviewed:   CBC: Recent Labs  Lab 11/27/24 1832 11/28/24 0210  WBC 7.5 10.1  NEUTROABS 5.4  --   HGB 14.3 14.1  HCT 43.2 41.7  MCV 84.4 84.1  PLT 224 227   Basic Metabolic Panel: Recent Labs  Lab 11/27/24 1832 11/28/24 0210  NA 136 137  K 3.4* 3.6  CL 99 99  CO2 20* 24  GLUCOSE 219* 203*  BUN 17 18  CREATININE 1.12* 1.09*  CALCIUM  9.5 9.5  MG  --  1.9   GFR: Estimated Creatinine Clearance: 67.1 mL/min (A) (by C-G formula based on SCr of 1.09 mg/dL (H)). Liver Function Tests: Recent Labs  Lab 11/27/24 1832  AST 26  ALT 11  ALKPHOS 79  BILITOT 0.6  PROT 7.2  ALBUMIN 4.4   No results for input(s): LIPASE, AMYLASE in the last 168 hours. No results for input(s): AMMONIA in the last 168 hours. Coagulation Profile: Recent Labs  Lab 11/27/24 1832  INR 0.9   Cardiac Enzymes: No results for input(s): CKTOTAL, CKMB, CKMBINDEX, TROPONINI in the last 168 hours. BNP (last 3 results) No results for input(s): PROBNP in the last 8760 hours. HbA1C: No results for input(s): HGBA1C in the last 72 hours. CBG: Recent Labs  Lab 12/02/24 0610 12/02/24 1222 12/02/24 1607 12/02/24 2119 12/03/24 0605  GLUCAP 169* 170* 99 137* 128*   Lipid Profile: No results for input(s): CHOL, HDL, LDLCALC,  TRIG, CHOLHDL, LDLDIRECT in the last 72 hours. Thyroid  Function Tests: No results for input(s): TSH, T4TOTAL, FREET4, T3FREE, THYROIDAB in the last 72 hours. Anemia Panel: No results for input(s): VITAMINB12, FOLATE, FERRITIN, TIBC, IRON, RETICCTPCT in the last 72 hours. Sepsis Labs: No results for input(s): PROCALCITON, LATICACIDVEN in the last 168 hours.  Recent Results (from the past 240 hours)  MRSA Next Gen by PCR, Nasal     Status: None   Collection Time: 11/27/24 11:53 PM   Specimen: Nasal Mucosa; Nasal Swab  Result Value Ref Range Status   MRSA by PCR Next Gen NOT DETECTED NOT DETECTED Final    Comment: (NOTE) The GeneXpert MRSA Assay (  FDA approved for NASAL specimens only), is one component of a comprehensive MRSA colonization surveillance program. It is not intended to diagnose MRSA infection nor to guide or monitor treatment for MRSA infections. Test performance is not FDA approved in patients less than 60 years old. Performed at [facility], [address] [place], [place] [address]          Radiology Studies: CT HEAD WO CONTRAST ( ) Result Date: 12/02/2024 EXAM: CTA Head With Intravenous Contrast. CT Head without Contrast. CLINICAL HISTORY: Subarachnoid hemorrhage [facility]) TECHNIQUE: Axial CTA images of the head with intravenous contrast. Three-dimensional MIP/volume rendered reformations were performed. Axial computed tomography images of the head/brain performed without intravenous contrast. Dose reduction technique was used including one or more of the following: automated exposure control, adjustment of mA and kV according to patient size, and/or iterative reconstruction. CONTRAST: 75 cc Omnipaque  IV COMPARISON: CTA and CT head 11/27/24 FINDINGS: CT HEAD: BRAIN: Mildly decreased volume and density of predominantly perimesencephalic subarachnoid hemorrhage. No mass Lesion. No CT evidence for acute territorial infarct. No midline  shift or extra-axial collection. VENTRICLES: No hydrocephalus. ORBITS: The orbits are unremarkable. SINUSES AND MASTOIDS: The paranasal sinuses and mastoid air cells are clear CTA HEAD: INTERNAL CAROTID ARTERIES: The intracranial ICAs are patent with no significant stenosis. No occlusion. No aneurysm. ANTERIOR CEREBRAL ARTERIES: No significant stenosis. No occlusion. No aneurysm. MIDDLE CEREBRAL ARTERIES: No significant stenosis. No occlusion. No aneurysm. POSTERIOR CEREBRAL ARTERIES: No significant stenosis. No occlusion. No aneurysm. BASILAR ARTERY: Since 1/14, interval decrease in calibar of the basilar artery (measuring 1.2 mm on series 8 image 119, previously measuring 1.6 mm in similar location) which is concerning for vasospasm. No occlusion. No aneurysm. VERTEBRAL ARTERIES: No significant stenosis. No occlusion. No aneurysm. OTHER: SOFT TISSUES: No acute finding. No masses or lymphadenopathy. BONES: No acute osseous abnormality. Other: Findings will be conveyed to the ordering provider by the radiology assistant and documented in PACs/Clario. IMPRESSION: 1. Since 1/14, interval decrease in caliber of the basilar artery which is concerning for vasospasm. No large vessel occlusion. 2. Mildly decreased volume and density of predominantly perimesencephalic subarachnoid hemorrhage. 3. No aneurysm identified. 4. No evidence of acute infarct on the CT head. MRI could provide more sensitive evaluation. Electronically signed by: Glendia Molt MD 12/02/2024 11:02 PM EST RP Workstation: [id number]   CT ANGIO HEAD W OR WO CONTRAST Result Date: 12/02/2024 EXAM: CTA Head With Intravenous Contrast. CT Head without Contrast. CLINICAL HISTORY: Subarachnoid hemorrhage [facility]) TECHNIQUE: Axial CTA images of the head with intravenous contrast. Three-dimensional MIP/volume rendered reformations were performed. Axial computed tomography images of the head/brain performed without intravenous contrast. Dose reduction technique was  used including one or more of the following: automated exposure control, adjustment of mA and kV according to patient size, and/or iterative reconstruction. CONTRAST: 75 cc Omnipaque  IV COMPARISON: CTA and CT head 11/27/24 FINDINGS: CT HEAD: BRAIN: Mildly decreased volume and density of predominantly perimesencephalic subarachnoid hemorrhage. No mass Lesion. No CT evidence for acute territorial infarct. No midline shift or extra-axial collection. VENTRICLES: No hydrocephalus. ORBITS: The orbits are unremarkable. SINUSES AND MASTOIDS: The paranasal sinuses and mastoid air cells are clear CTA HEAD: INTERNAL CAROTID ARTERIES: The intracranial ICAs are patent with no significant stenosis. No occlusion. No aneurysm. ANTERIOR CEREBRAL ARTERIES: No significant stenosis. No occlusion. No aneurysm. MIDDLE CEREBRAL ARTERIES: No significant stenosis. No occlusion. No aneurysm. POSTERIOR CEREBRAL ARTERIES: No significant stenosis. No occlusion. No aneurysm. BASILAR ARTERY: Since 1/14, interval decrease in calibar of the  basilar artery (measuring 1.2 mm on series 8 image 119, previously measuring 1.6 mm in similar location) which is concerning for vasospasm. No occlusion. No aneurysm. VERTEBRAL ARTERIES: No significant stenosis. No occlusion. No aneurysm. OTHER: SOFT TISSUES: No acute finding. No masses or lymphadenopathy. BONES: No acute osseous abnormality. Other: Findings will be conveyed to the ordering provider by the radiology assistant and documented in PACs/Clario. IMPRESSION: 1. Since 1/14, interval decrease in caliber of the basilar artery which is concerning for vasospasm. No large vessel occlusion. 2. Mildly decreased volume and density of predominantly perimesencephalic subarachnoid hemorrhage. 3. No aneurysm identified. 4. No evidence of acute infarct on the CT head. MRI could provide more sensitive evaluation. Electronically signed by: Glendia Molt MD 12/02/2024 11:02 PM EST RP Workstation: [id number]            LOS: 6 days   Time spent= 35 mins    Burgess JAYSON Dare, MD [facility]  If 7PM-7AM, please contact night-coverage  12/03/2024, 10:23 AM  "

## 2024-12-03 NOTE — Plan of Care (Signed)
 Because of her headache, she becomes anxious and is wondering if something is going on that is causing her headaches.  MD is aware and said to just treat accordingly.  She does decline her anxiety med because said she does not want to be too sleepy.    Problem: Education: Goal: Knowledge of General Education information will improve Description: Including pain rating scale, medication(s)/side effects and non-pharmacologic comfort measures Outcome: Progressing   Problem: Activity: Goal: Risk for activity intolerance will decrease Outcome: Progressing   Problem: Nutrition: Goal: Adequate nutrition will be maintained Outcome: Progressing   Problem: Coping: Goal: Level of anxiety will decrease Outcome: Progressing   Problem: Safety: Goal: Ability to remain free from injury will improve Outcome: Progressing   Problem: Skin Integrity: Goal: Risk for impaired skin integrity will decrease Outcome: Progressing   Problem: Activity: Goal: Risk for activity intolerance will decrease Outcome: Progressing   Problem: Nutrition: Goal: Adequate nutrition will be maintained Outcome: Progressing

## 2024-12-04 ENCOUNTER — Encounter (HOSPITAL_COMMUNITY): Payer: Self-pay

## 2024-12-04 ENCOUNTER — Ambulatory Visit (HOSPITAL_COMMUNITY)

## 2024-12-04 DIAGNOSIS — I609 Nontraumatic subarachnoid hemorrhage, unspecified: Secondary | ICD-10-CM | POA: Diagnosis not present

## 2024-12-04 LAB — GLUCOSE, CAPILLARY
Glucose-Capillary: 131 mg/dL — ABNORMAL HIGH (ref 70–99)
Glucose-Capillary: 194 mg/dL — ABNORMAL HIGH (ref 70–99)
Glucose-Capillary: 104 mg/dL — ABNORMAL HIGH (ref 70–99)
Glucose-Capillary: 121 mg/dL — ABNORMAL HIGH (ref 70–99)

## 2024-12-04 MED ORDER — BENZONATATE 100 MG PO CAPS
100.0000 mg | ORAL_CAPSULE | Freq: Three times a day (TID) | ORAL | Status: DC | PRN
  Administered 2024-12-04: 100 mg via ORAL
  Filled 2024-12-04: qty 1

## 2024-12-04 NOTE — Progress Notes (Addendum)
 Occupational Therapy Treatment Patient Details Name: Amy Adams MRN: [id number] DOB: Jun 18, 1966 Today's Date: 12/04/2024   History of present illness 59 y.o. female presents to [facility] hospital on 11/27/2024 with headache and pain posterior to R eye since laser eye surgery 6 weeks ago. On 1/14 she then developed neck spasms and facial numbness and L droop. 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 PMH includes HTN, CKD, HLD, glaucoma, DMII, OA, chronic low back pain, RLS.   OT comments  Patient supine in bed, reports significant headache and photophobia.  Provided sunglasses to increase tolerance to light which pt reports helps, but pt voicing preference to keeping R eye closed.  Reports diplopia but not tested today due to headache.  Educated on using natural light, but pt unable to tolerate today. She completes bed mobility with supervision, transfers with min assist and LB dressing at EOB with min assist.  She does report dizziness when standing, SBP assessed as below. Encouraged more upright position in bed throughout the day, as tolerates.  Will follow acutely, continue to recommend >3 hrs/day inpatient setting.   SBP sitting EOB 141 SBP standing 120       If plan is discharge home, recommend the following:  A little help with walking and/or transfers;A little help with bathing/dressing/bathroom;Assistance with cooking/housework;Direct supervision/assist for medications management;Direct supervision/assist for financial management;Assist for transportation;Help with stairs or ramp for entrance   Equipment Recommendations  Tub/shower seat    Recommendations for Other Services Rehab consult    Precautions / Restrictions Precautions Precautions: Fall Recall of Precautions/Restrictions: Impaired Precaution/Restrictions Comments: SBP<160 Restrictions Weight Bearing Restrictions Per Provider Order: No       Mobility Bed Mobility Overal bed mobility: Needs  Assistance Bed Mobility: Rolling, Sidelying to Sit, Sit to Sidelying Rolling: Supervision Sidelying to sit: Supervision     Sit to sidelying: Supervision General bed mobility comments: using rails , no assist required    Transfers Overall transfer level: Needs assistance Equipment used: Rolling walker (2 wheels) Transfers: Sit to/from Stand Sit to Stand: Min assist           General transfer comment: for safety, balance.     Balance Overall balance assessment: Needs assistance Sitting-balance support: No upper extremity supported, Feet supported Sitting balance-Leahy Scale: Fair Sitting balance - Comments: supervision at EOB   Standing balance support: During functional activity, Bilateral upper extremity supported Standing balance-Leahy Scale: Poor Standing balance comment: Reliant on RW                           ADL either performed or assessed with clinical judgement   ADL Overall ADL's : Needs assistance/impaired     Grooming: Set up;Sitting               Lower Body Dressing: Minimal assistance;Sit to/from stand               Functional mobility during ADLs: Minimal assistance;Rolling walker (2 wheels)      Extremity/Trunk Assessment              Vision   Additional Comments: patient reports significant photophobia and unable to tolerate lights on (by sink) with R eye open. Provided sunglasses and pt reports they help but still has poor tolerance. Continues to endorse diplopia but did not test this session.  Tried to educated on using natural light but pt unable to tolerate slight opening of blind this date.   Perception  Praxis     Communication Communication Communication: No apparent difficulties   Cognition Arousal: Alert Behavior During Therapy: WFL for tasks assessed/performed Cognition: Cognition impaired     Awareness: Online awareness impaired   Attention impairment (select first level of impairment):  Sustained attention Executive functioning impairment (select all impairments): Problem solving, Organization, Sequencing OT - Cognition Comments: pleasant, some decreased insight into deficits but receptive to education on areas to address. some attention deficits and can be quick with movements. internally distracted by headache                 Following commands: Impaired Following commands impaired: Follows one step commands with increased time      Cueing   Cueing Techniques: Verbal cues  Exercises      Shoulder Instructions       General Comments pt reports dizziness standing, SBP drop + orthostatic at 141 EOB, 120 standing.  educated on sitting upright in bed with HOB higher throughout the day- pt voiced understanding    Pertinent Vitals/ Pain       Pain Assessment Pain Assessment: Faces Faces Pain Scale: Hurts whole lot Pain Location: headache Pain Descriptors / Indicators: Headache Pain Intervention(s): Limited activity within patient's tolerance, Monitored during session, Repositioned  Home Living                                          Prior Functioning/Environment              Frequency  Min 2X/week        Progress Toward Goals  OT Goals(current goals can now be found in the care plan section)  Progress towards OT goals: Progressing toward goals  Acute Rehab OT Goals Patient Stated Goal: feel better, get rid of my headache OT Goal Formulation: With patient Time For Goal Achievement: 12/14/24 Potential to Achieve Goals: Good  Plan      Co-evaluation                 AM-PAC OT 6 Clicks Daily Activity     Outcome Measure   Help from another person eating meals?: None Help from another person taking care of personal grooming?: A Little Help from another person toileting, which includes using toliet, bedpan, or urinal?: A Lot Help from another person bathing (including washing, rinsing, drying)?: A Little Help  from another person to put on and taking off regular upper body clothing?: A Little Help from another person to put on and taking off regular lower body clothing?: A Little 6 Click Score: 18    End of Session Equipment Utilized During Treatment: Gait belt;Rolling walker (2 wheels)  OT Visit Diagnosis: Other abnormalities of gait and mobility (R26.89);Muscle weakness (generalized) (M62.81);Pain;Other symptoms and signs involving the nervous system (R29.898) Pain - part of body:  (headache)   Activity Tolerance Patient limited by pain   Patient Left in bed;with call bell/phone within reach;with bed alarm set;with family/visitor present   Nurse Communication Mobility status        Time: 9080-9055 OT Time Calculation (min): 25 min  Charges: OT General Charges $OT Visit: 1 Visit OT Treatments $Self Care/Home Management : 23-37 mins  Etta NOVAK, OT Acute Rehabilitation Services Office [phone] Secure Chat Preferred    Etta GORMAN Hope 12/04/2024, 1:05 PM

## 2024-12-04 NOTE — Plan of Care (Signed)
   Problem: Activity: Goal: Risk for activity intolerance will decrease Outcome: Progressing

## 2024-12-04 NOTE — Plan of Care (Signed)
 Has been refusing antianxiety med but c/o feeling anxious because of the pain and everything that is going on with her.  I did ask her to maybe take the med and that may even help her calm down but she declined.  At first said she would consider taking a half dose and I told her I just need to notify MD and get an order for a lesser dose and she said never mind because she just does not want to feel drugged or like she cannot function without the med.  I did tell her that even a lesser dose may help with some of her anxiety and she still declined.  Said she has not been taking that and will not take it.     Problem: Clinical Measurements: Goal: Will remain free from infection Outcome: Progressing   Problem: Clinical Measurements: Goal: Respiratory complications will improve Outcome: Progressing   Problem: Activity: Goal: Risk for activity intolerance will decrease Outcome: Progressing   Problem: Nutrition: Goal: Adequate nutrition will be maintained Outcome: Progressing   Problem: Coping: Goal: Level of anxiety will decrease Outcome: Progressing   Problem: Pain Managment: Goal: General experience of comfort will improve and/or be controlled Outcome: Progressing   Problem: Safety: Goal: Ability to remain free from injury will improve Outcome: Progressing   Problem: Skin Integrity: Goal: Risk for impaired skin integrity will decrease Outcome: Progressing

## 2024-12-04 NOTE — Progress Notes (Signed)
" °  Amy Adams is a 59 y.o. female, she is currently 7 days into her hospital stay for bilateral cistern SAH with mild hydrocephalus.   No family is present at the bedside.   Assessment:  Today the patient is alert and oriented time, date, place, city, president.  The patients pain is currently a 7/10, which has Hasn't changed  since the last assessment. The patient describes the pain as A radiation from her right eye to her ear. Currently pain is managed by Acetaminophen  1000mg  PO TID, Fioricet .  she reports little resolution of  symptoms. Current symptoms include headache with photophobia and some radiating neck pain.   No neurological deficits at this time.    Plan:   I had the pleasure of seeing Amy Adams today.  She continues to be in pain, describing the pain as a radiation from her right eye to her ear; however, the pain is expected with the subarachnoid hemorrhage that she had.   No neurological changes or deficits at this time.   The primary goal for today is to continue to monitor for spasm. She has Nimodipine  (Nimotop ) on board to prevent.    Bowel:  Instruct the patient not to strain/bear down during bowel movements to prevent rises in ICP.     "

## 2024-12-04 NOTE — Progress Notes (Signed)
 Inpatient Rehab Admissions Coordinator:    CIR following. I have insurance auth for admit but do not have a bed for her today. Will continue to follow.   Leita Kleine, MS, CCC-SLP Rehab Admissions Coordinator  [phone] (celll) [phone] (office)

## 2024-12-04 NOTE — Progress Notes (Signed)
 " PROGRESS NOTE    Amy Adams  FMW:[id number] DOB: 1966/01/11 DOA: 11/27/2024 PCP: Arloa Elsie SAUNDERS, MD    Brief Narrative:   59 year old with history of neck spasm, lower extremity numbness, nausea, left facial droop reported of having laser eye surgery 6 weeks ago for glaucoma thereafter continued right sided eye pain and headache.  There after 48 hours prior to admission started experiencing blurry vision in the right eye therefore came to the hospital.  CT of the head found to have bibasilar cisternal subarachnoid hemorrhage and elevated systolic blood pressure.  She was admitted to the ICU and started on antihypertensive drip.  CTA was negative for aneurysm.  She was also started on Keppra  for prophylaxis. Persistent headache, repeated CT head on 1/19 showing some concerns of vasospasm of by basilar artery.  Neuro IR is following.  Assessment & Plan:  Subarachnoid hemorrhage Persistent headaches - Neuro IR is following the patient.  Repeat CTA of the head showing some vasospasm of by basilar artery.  Neuro IR recommends observing her at least until 1/22. - Echocardiogram showing preserved EF - PT/OT CIR -Tylenol  and oxycodone  ordered  Vasovagal - Isolated episode on 1/18  Hypertensive emergency History of essential hypertension - Currently on Norvasc , HCTZ, ARB, Lopressor .  Increase clonidine  recently started following outpatient with nephrology for better blood pressure control - IV as needed  Headache, nausea and vomiting - Supportive care  Diabetes mellitus type 2 Peripheral neuropathy - Sliding scale and Accu-Chek.  A1c 6.7 - Gabapentin   CKD stage IIIa - Creatinine around baseline of 1.1  Rheumatoid arthritis of chronic immunosuppressive's - Plaquenil   Hypothyroidism -Synthroid   Anxiety/depression - On Xanax , Cymbalta   GERD - PPI   DVT prophylaxis: enoxaparin  (LOVENOX ) injection 40 mg Start: 11/29/24 2200 SCDs Start: 11/27/24 2353      Code  Status: Full Code Family Communication:   Maintain hospital stay per neuro IR at least until 1/22   PT Follow up Recs: Acute Inpatient Rehab (3hours/Day)11/30/2024 1455  Subjective: Still having headache.   Examination:  General exam: Appears calm and comfortable  Respiratory system: Clear to auscultation. Respiratory effort normal. Cardiovascular system: S1 & S2 heard, RRR. No JVD, murmurs, rubs, gallops or clicks. No pedal edema. Gastrointestinal system: Abdomen is nondistended, soft and nontender. No organomegaly or masses felt. Normal bowel sounds heard. Central nervous system: Alert and oriented. No focal neurological deficits. Extremities: Symmetric 5 x 5 power. Skin: No rashes, lesions or ulcers Psychiatry: Judgement and insight appear normal. Mood & affect appropriate.                Diet Orders (From admission, onward)     Start     Ordered   11/28/24 1002  Diet Heart Room service appropriate? Yes with Assist; Fluid consistency: Thin  Diet effective now       Question Answer Comment  Room service appropriate? Yes with Assist   Fluid consistency: Thin      11/28/24 1001            Objective: Vitals:   12/03/24 1652 12/04/24 0026 12/04/24 0601 12/04/24 0714  BP: (!) 153/79 133/76 99/83 (!) 151/73  Pulse: 72 60  69  Resp: 19 16 14 16   Temp: 100 F (37.8 C) 98.1 F (36.7 C) 98.1 F (36.7 C) 98.4 F (36.9 C)  TempSrc: Oral Oral Oral Oral  SpO2: 99% 98% 99% 96%  Weight:      Height:       No intake or  output data in the 24 hours ending 12/04/24 1009 Filed Weights   11/29/24 0500 11/30/24 0500 12/02/24 0500  Weight: 94.1 kg 96.6 kg 97.6 kg    Scheduled Meds:  acetaminophen   1,000 mg Oral TID   ALPRAZolam   1 mg Oral TID   amLODipine   10 mg Oral Daily   butalbital -acetaminophen -caffeine   2 tablet Oral Q6H   Chlorhexidine  Gluconate Cloth  6 each Topical Daily   cloNIDine   0.2 mg Oral TID   DULoxetine   60 mg Oral BID   enoxaparin  (LOVENOX )  injection  40 mg Subcutaneous Q24H   fenofibrate   54 mg Oral Daily   gabapentin   400 mg Oral TID   hydrochlorothiazide   25 mg Oral Daily   hydroxychloroquine   400 mg Oral Daily   insulin  aspart  0-5 Units Subcutaneous QHS   insulin  aspart  0-9 Units Subcutaneous TID WC   insulin  glargine  14 Units Subcutaneous Daily   irbesartan   300 mg Oral Daily   leflunomide   20 mg Oral Daily   levothyroxine   50 mcg Oral Q0600   lidocaine   1 patch Transdermal Q24H   metoprolol  tartrate  50 mg Oral BID   niMODipine   60 mg Oral Q4H   pantoprazole   40 mg Oral QAC breakfast   Continuous Infusions:  Nutritional status     Body mass index is 32.72 kg/m.  Data Reviewed:   CBC: Recent Labs  Lab 11/27/24 1832 11/28/24 0210  WBC 7.5 10.1  NEUTROABS 5.4  --   HGB 14.3 14.1  HCT 43.2 41.7  MCV 84.4 84.1  PLT 224 227   Basic Metabolic Panel: Recent Labs  Lab 11/27/24 1832 11/28/24 0210  NA 136 137  K 3.4* 3.6  CL 99 99  CO2 20* 24  GLUCOSE 219* 203*  BUN 17 18  CREATININE 1.12* 1.09*  CALCIUM  9.5 9.5  MG  --  1.9   GFR: Estimated Creatinine Clearance: 67.1 mL/min (A) (by C-G formula based on SCr of 1.09 mg/dL (H)). Liver Function Tests: Recent Labs  Lab 11/27/24 1832  AST 26  ALT 11  ALKPHOS 79  BILITOT 0.6  PROT 7.2  ALBUMIN 4.4   No results for input(s): LIPASE, AMYLASE in the last 168 hours. No results for input(s): AMMONIA in the last 168 hours. Coagulation Profile: Recent Labs  Lab 11/27/24 1832  INR 0.9   Cardiac Enzymes: No results for input(s): CKTOTAL, CKMB, CKMBINDEX, TROPONINI in the last 168 hours. BNP (last 3 results) No results for input(s): PROBNP in the last 8760 hours. HbA1C: No results for input(s): HGBA1C in the last 72 hours. CBG: Recent Labs  Lab 12/03/24 0605 12/03/24 1156 12/03/24 1654 12/03/24 2118 12/04/24 0629  GLUCAP 128* 150* 129* 191* 131*   Lipid Profile: No results for input(s): CHOL, HDL, LDLCALC,  TRIG, CHOLHDL, LDLDIRECT in the last 72 hours. Thyroid  Function Tests: No results for input(s): TSH, T4TOTAL, FREET4, T3FREE, THYROIDAB in the last 72 hours. Anemia Panel: No results for input(s): VITAMINB12, FOLATE, FERRITIN, TIBC, IRON, RETICCTPCT in the last 72 hours. Sepsis Labs: No results for input(s): PROCALCITON, LATICACIDVEN in the last 168 hours.  Recent Results (from the past 240 hours)  MRSA Next Gen by PCR, Nasal     Status: None   Collection Time: 11/27/24 11:53 PM   Specimen: Nasal Mucosa; Nasal Swab  Result Value Ref Range Status   MRSA by PCR Next Gen NOT DETECTED NOT DETECTED Final    Comment: (NOTE) The GeneXpert MRSA Assay (FDA  approved for NASAL specimens only), is one component of a comprehensive MRSA colonization surveillance program. It is not intended to diagnose MRSA infection nor to guide or monitor treatment for MRSA infections. Test performance is not FDA approved in patients less than 69 years old. Performed at [facility], [address] [place], [place] [address]          Radiology Studies: CT HEAD WO CONTRAST ( ) Result Date: 12/02/2024 EXAM: CTA Head With Intravenous Contrast. CT Head without Contrast. CLINICAL HISTORY: Subarachnoid hemorrhage [facility]) TECHNIQUE: Axial CTA images of the head with intravenous contrast. Three-dimensional MIP/volume rendered reformations were performed. Axial computed tomography images of the head/brain performed without intravenous contrast. Dose reduction technique was used including one or more of the following: automated exposure control, adjustment of mA and kV according to patient size, and/or iterative reconstruction. CONTRAST: 75 cc Omnipaque  IV COMPARISON: CTA and CT head 11/27/24 FINDINGS: CT HEAD: BRAIN: Mildly decreased volume and density of predominantly perimesencephalic subarachnoid hemorrhage. No mass Lesion. No CT evidence for acute territorial infarct. No midline  shift or extra-axial collection. VENTRICLES: No hydrocephalus. ORBITS: The orbits are unremarkable. SINUSES AND MASTOIDS: The paranasal sinuses and mastoid air cells are clear CTA HEAD: INTERNAL CAROTID ARTERIES: The intracranial ICAs are patent with no significant stenosis. No occlusion. No aneurysm. ANTERIOR CEREBRAL ARTERIES: No significant stenosis. No occlusion. No aneurysm. MIDDLE CEREBRAL ARTERIES: No significant stenosis. No occlusion. No aneurysm. POSTERIOR CEREBRAL ARTERIES: No significant stenosis. No occlusion. No aneurysm. BASILAR ARTERY: Since 1/14, interval decrease in calibar of the basilar artery (measuring 1.2 mm on series 8 image 119, previously measuring 1.6 mm in similar location) which is concerning for vasospasm. No occlusion. No aneurysm. VERTEBRAL ARTERIES: No significant stenosis. No occlusion. No aneurysm. OTHER: SOFT TISSUES: No acute finding. No masses or lymphadenopathy. BONES: No acute osseous abnormality. Other: Findings will be conveyed to the ordering provider by the radiology assistant and documented in PACs/Clario. IMPRESSION: 1. Since 1/14, interval decrease in caliber of the basilar artery which is concerning for vasospasm. No large vessel occlusion. 2. Mildly decreased volume and density of predominantly perimesencephalic subarachnoid hemorrhage. 3. No aneurysm identified. 4. No evidence of acute infarct on the CT head. MRI could provide more sensitive evaluation. Electronically signed by: Glendia Molt MD 12/02/2024 11:02 PM EST RP Workstation: [id number]   CT ANGIO HEAD W OR WO CONTRAST Result Date: 12/02/2024 EXAM: CTA Head With Intravenous Contrast. CT Head without Contrast. CLINICAL HISTORY: Subarachnoid hemorrhage [facility]) TECHNIQUE: Axial CTA images of the head with intravenous contrast. Three-dimensional MIP/volume rendered reformations were performed. Axial computed tomography images of the head/brain performed without intravenous contrast. Dose reduction technique was  used including one or more of the following: automated exposure control, adjustment of mA and kV according to patient size, and/or iterative reconstruction. CONTRAST: 75 cc Omnipaque  IV COMPARISON: CTA and CT head 11/27/24 FINDINGS: CT HEAD: BRAIN: Mildly decreased volume and density of predominantly perimesencephalic subarachnoid hemorrhage. No mass Lesion. No CT evidence for acute territorial infarct. No midline shift or extra-axial collection. VENTRICLES: No hydrocephalus. ORBITS: The orbits are unremarkable. SINUSES AND MASTOIDS: The paranasal sinuses and mastoid air cells are clear CTA HEAD: INTERNAL CAROTID ARTERIES: The intracranial ICAs are patent with no significant stenosis. No occlusion. No aneurysm. ANTERIOR CEREBRAL ARTERIES: No significant stenosis. No occlusion. No aneurysm. MIDDLE CEREBRAL ARTERIES: No significant stenosis. No occlusion. No aneurysm. POSTERIOR CEREBRAL ARTERIES: No significant stenosis. No occlusion. No aneurysm. BASILAR ARTERY: Since 1/14, interval decrease in calibar of the basilar  artery (measuring 1.2 mm on series 8 image 119, previously measuring 1.6 mm in similar location) which is concerning for vasospasm. No occlusion. No aneurysm. VERTEBRAL ARTERIES: No significant stenosis. No occlusion. No aneurysm. OTHER: SOFT TISSUES: No acute finding. No masses or lymphadenopathy. BONES: No acute osseous abnormality. Other: Findings will be conveyed to the ordering provider by the radiology assistant and documented in PACs/Clario. IMPRESSION: 1. Since 1/14, interval decrease in caliber of the basilar artery which is concerning for vasospasm. No large vessel occlusion. 2. Mildly decreased volume and density of predominantly perimesencephalic subarachnoid hemorrhage. 3. No aneurysm identified. 4. No evidence of acute infarct on the CT head. MRI could provide more sensitive evaluation. Electronically signed by: Glendia Molt MD 12/02/2024 11:02 PM EST RP Workstation: [id number]            LOS: 7 days   Time spent= 35 mins    Burgess JAYSON Dare, MD [facility]  If 7PM-7AM, please contact night-coverage  12/04/2024, 10:09 AM  "

## 2024-12-05 ENCOUNTER — Inpatient Hospital Stay (HOSPITAL_COMMUNITY)
Admission: AD | Admit: 2024-12-05 | Discharge: 2024-12-13 | DRG: 057 | Disposition: A | Discharge status: Home / Self Care | Source: Intra-hospital | Attending: Physical Medicine & Rehabilitation | Admitting: Physical Medicine & Rehabilitation

## 2024-12-05 ENCOUNTER — Other Ambulatory Visit: Payer: Self-pay

## 2024-12-05 ENCOUNTER — Inpatient Hospital Stay (HOSPITAL_COMMUNITY)

## 2024-12-05 ENCOUNTER — Encounter (HOSPITAL_COMMUNITY): Payer: Self-pay | Admitting: Physical Medicine & Rehabilitation

## 2024-12-05 DIAGNOSIS — E785 Hyperlipidemia, unspecified: Secondary | ICD-10-CM | POA: Diagnosis present

## 2024-12-05 DIAGNOSIS — Z8719 Personal history of other diseases of the digestive system: Secondary | ICD-10-CM

## 2024-12-05 DIAGNOSIS — Z833 Family history of diabetes mellitus: Secondary | ICD-10-CM

## 2024-12-05 DIAGNOSIS — Z9889 Other specified postprocedural states: Secondary | ICD-10-CM | POA: Diagnosis not present

## 2024-12-05 DIAGNOSIS — G2581 Restless legs syndrome: Secondary | ICD-10-CM | POA: Diagnosis present

## 2024-12-05 DIAGNOSIS — Z825 Family history of asthma and other chronic lower respiratory diseases: Secondary | ICD-10-CM

## 2024-12-05 DIAGNOSIS — H409 Unspecified glaucoma: Secondary | ICD-10-CM | POA: Diagnosis present

## 2024-12-05 DIAGNOSIS — I10 Essential (primary) hypertension: Secondary | ICD-10-CM | POA: Diagnosis not present

## 2024-12-05 DIAGNOSIS — E1122 Type 2 diabetes mellitus with diabetic chronic kidney disease: Secondary | ICD-10-CM | POA: Diagnosis present

## 2024-12-05 DIAGNOSIS — I609 Nontraumatic subarachnoid hemorrhage, unspecified: Principal | ICD-10-CM

## 2024-12-05 DIAGNOSIS — K219 Gastro-esophageal reflux disease without esophagitis: Secondary | ICD-10-CM | POA: Diagnosis present

## 2024-12-05 DIAGNOSIS — Z6833 Body mass index (BMI) 33.0-33.9, adult: Secondary | ICD-10-CM | POA: Diagnosis not present

## 2024-12-05 DIAGNOSIS — G919 Hydrocephalus, unspecified: Secondary | ICD-10-CM | POA: Diagnosis present

## 2024-12-05 DIAGNOSIS — N183 Chronic kidney disease, stage 3 unspecified: Secondary | ICD-10-CM | POA: Diagnosis not present

## 2024-12-05 DIAGNOSIS — Z7985 Long-term (current) use of injectable non-insulin antidiabetic drugs: Secondary | ICD-10-CM

## 2024-12-05 DIAGNOSIS — Z885 Allergy status to narcotic agent status: Secondary | ICD-10-CM

## 2024-12-05 DIAGNOSIS — E871 Hypo-osmolality and hyponatremia: Secondary | ICD-10-CM | POA: Diagnosis not present

## 2024-12-05 DIAGNOSIS — Z79899 Other long term (current) drug therapy: Secondary | ICD-10-CM | POA: Diagnosis not present

## 2024-12-05 DIAGNOSIS — Z7984 Long term (current) use of oral hypoglycemic drugs: Secondary | ICD-10-CM

## 2024-12-05 DIAGNOSIS — Z79890 Hormone replacement therapy (postmenopausal): Secondary | ICD-10-CM | POA: Diagnosis not present

## 2024-12-05 DIAGNOSIS — G441 Vascular headache, not elsewhere classified: Secondary | ICD-10-CM | POA: Diagnosis not present

## 2024-12-05 DIAGNOSIS — M545 Low back pain, unspecified: Secondary | ICD-10-CM | POA: Diagnosis present

## 2024-12-05 DIAGNOSIS — Z741 Need for assistance with personal care: Secondary | ICD-10-CM | POA: Diagnosis present

## 2024-12-05 DIAGNOSIS — I629 Nontraumatic intracranial hemorrhage, unspecified: Secondary | ICD-10-CM

## 2024-12-05 DIAGNOSIS — Z881 Allergy status to other antibiotic agents status: Secondary | ICD-10-CM

## 2024-12-05 DIAGNOSIS — H538 Other visual disturbances: Secondary | ICD-10-CM | POA: Diagnosis present

## 2024-12-05 DIAGNOSIS — N1831 Chronic kidney disease, stage 3a: Secondary | ICD-10-CM | POA: Diagnosis present

## 2024-12-05 DIAGNOSIS — R519 Headache, unspecified: Secondary | ICD-10-CM

## 2024-12-05 DIAGNOSIS — M069 Rheumatoid arthritis, unspecified: Secondary | ICD-10-CM | POA: Diagnosis present

## 2024-12-05 DIAGNOSIS — E119 Type 2 diabetes mellitus without complications: Secondary | ICD-10-CM

## 2024-12-05 DIAGNOSIS — R197 Diarrhea, unspecified: Secondary | ICD-10-CM | POA: Diagnosis not present

## 2024-12-05 DIAGNOSIS — G8918 Other acute postprocedural pain: Secondary | ICD-10-CM | POA: Diagnosis present

## 2024-12-05 DIAGNOSIS — I69054 Hemiplegia and hemiparesis following nontraumatic subarachnoid hemorrhage affecting left non-dominant side: Principal | ICD-10-CM

## 2024-12-05 DIAGNOSIS — E039 Hypothyroidism, unspecified: Secondary | ICD-10-CM | POA: Diagnosis present

## 2024-12-05 DIAGNOSIS — Z90710 Acquired absence of both cervix and uterus: Secondary | ICD-10-CM

## 2024-12-05 DIAGNOSIS — I69092 Facial weakness following nontraumatic subarachnoid hemorrhage: Secondary | ICD-10-CM

## 2024-12-05 DIAGNOSIS — Z602 Problems related to living alone: Secondary | ICD-10-CM | POA: Diagnosis present

## 2024-12-05 DIAGNOSIS — Z794 Long term (current) use of insulin: Secondary | ICD-10-CM | POA: Diagnosis not present

## 2024-12-05 DIAGNOSIS — Z7960 Long term (current) use of unspecified immunomodulators and immunosuppressants: Secondary | ICD-10-CM

## 2024-12-05 DIAGNOSIS — E1169 Type 2 diabetes mellitus with other specified complication: Secondary | ICD-10-CM | POA: Diagnosis not present

## 2024-12-05 DIAGNOSIS — E66811 Obesity, class 1: Secondary | ICD-10-CM | POA: Diagnosis present

## 2024-12-05 DIAGNOSIS — G43909 Migraine, unspecified, not intractable, without status migrainosus: Secondary | ICD-10-CM | POA: Diagnosis present

## 2024-12-05 DIAGNOSIS — E876 Hypokalemia: Secondary | ICD-10-CM | POA: Diagnosis not present

## 2024-12-05 DIAGNOSIS — F419 Anxiety disorder, unspecified: Secondary | ICD-10-CM | POA: Diagnosis present

## 2024-12-05 DIAGNOSIS — Z888 Allergy status to other drugs, medicaments and biological substances status: Secondary | ICD-10-CM

## 2024-12-05 DIAGNOSIS — G8929 Other chronic pain: Secondary | ICD-10-CM | POA: Diagnosis present

## 2024-12-05 DIAGNOSIS — E1142 Type 2 diabetes mellitus with diabetic polyneuropathy: Secondary | ICD-10-CM | POA: Diagnosis present

## 2024-12-05 DIAGNOSIS — I129 Hypertensive chronic kidney disease with stage 1 through stage 4 chronic kidney disease, or unspecified chronic kidney disease: Secondary | ICD-10-CM | POA: Diagnosis present

## 2024-12-05 LAB — CBC
HCT: 43.2 % (ref 36.0–46.0)
Hemoglobin: 14.8 g/dL (ref 12.0–15.0)
MCH: 27.8 pg (ref 26.0–34.0)
MCHC: 34.3 g/dL (ref 30.0–36.0)
MCV: 81.1 fL (ref 80.0–100.0)
Platelets: 222 K/uL (ref 150–400)
RBC: 5.33 MIL/uL — ABNORMAL HIGH (ref 3.87–5.11)
RDW: 12.5 % (ref 11.5–15.5)
WBC: 9.5 K/uL (ref 4.0–10.5)
nRBC: 0 % (ref 0.0–0.2)

## 2024-12-05 LAB — GLUCOSE, CAPILLARY
Glucose-Capillary: 122 mg/dL — ABNORMAL HIGH (ref 70–99)
Glucose-Capillary: 103 mg/dL — ABNORMAL HIGH (ref 70–99)
Glucose-Capillary: 123 mg/dL — ABNORMAL HIGH (ref 70–99)
Glucose-Capillary: 134 mg/dL — ABNORMAL HIGH (ref 70–99)

## 2024-12-05 LAB — CREATININE, SERUM
Creatinine, Ser: 0.95 mg/dL (ref 0.44–1.00)
GFR, Estimated: >60 mL/min

## 2024-12-05 MED ORDER — METOPROLOL TARTRATE 50 MG PO TABS: 50.0000 mg | ORAL_TABLET | Freq: Two times a day (BID) | ORAL | Status: DC

## 2024-12-05 MED ORDER — BUTALBITAL-APAP-CAFFEINE 50-325-40 MG PO TABS: 2.0000 | ORAL_TABLET | Freq: Four times a day (QID) | ORAL | Status: DC | PRN

## 2024-12-05 MED ORDER — INSULIN ASPART 100 UNIT/ML IJ SOLN: 0.0000 [IU] | Freq: Three times a day (TID) | INTRAMUSCULAR | Status: AC

## 2024-12-05 MED ORDER — INSULIN GLARGINE 100 UNIT/ML ~~LOC~~ SOLN
14.0000 [IU] | SUBCUTANEOUS | Status: DC
  Administered 2024-12-06 – 2024-12-13 (×8): 14 [IU] via SUBCUTANEOUS
  Filled 2024-12-05 (×9): qty 0.14

## 2024-12-05 MED ORDER — SENNA 8.6 MG PO TABS: 1.0000 | ORAL_TABLET | Freq: Two times a day (BID) | ORAL | Status: DC | PRN

## 2024-12-05 MED ORDER — ACETAMINOPHEN 500 MG PO TABS: 1000.0000 mg | ORAL_TABLET | Freq: Three times a day (TID) | ORAL | Status: DC | PRN

## 2024-12-05 MED ORDER — ACETAMINOPHEN 325 MG PO TABS
325.0000 mg | ORAL_TABLET | ORAL | Status: DC | PRN
  Administered 2024-12-05 – 2024-12-12 (×3): 650 mg via ORAL
  Filled 2024-12-05 (×3): qty 2

## 2024-12-05 MED ORDER — DULOXETINE HCL 60 MG PO CPEP
60.0000 mg | ORAL_CAPSULE | Freq: Two times a day (BID) | ORAL | Status: DC
  Administered 2024-12-05 – 2024-12-13 (×16): 60 mg via ORAL
  Filled 2024-12-05 (×4): qty 1
  Filled 2024-12-05: qty 2
  Filled 2024-12-05 (×11): qty 1

## 2024-12-05 MED ORDER — BENZONATATE 100 MG PO CAPS: 100.0000 mg | ORAL_CAPSULE | Freq: Three times a day (TID) | ORAL | Status: DC | PRN

## 2024-12-05 MED ORDER — GABAPENTIN 400 MG PO CAPS
400.0000 mg | ORAL_CAPSULE | Freq: Three times a day (TID) | ORAL | Status: DC
  Administered 2024-12-05 – 2024-12-07 (×5): 400 mg via ORAL
  Filled 2024-12-05 (×5): qty 1

## 2024-12-05 MED ORDER — ENOXAPARIN SODIUM 40 MG/0.4ML IJ SOSY
40.0000 mg | PREFILLED_SYRINGE | INTRAMUSCULAR | Status: DC
  Administered 2024-12-05 – 2024-12-12 (×8): 40 mg via SUBCUTANEOUS
  Filled 2024-12-05 (×8): qty 0.4

## 2024-12-05 MED ORDER — POLYETHYLENE GLYCOL 3350 17 G PO PACK: 17.0000 g | PACK | Freq: Every day | ORAL | Status: AC | PRN

## 2024-12-05 MED ORDER — PANTOPRAZOLE SODIUM 40 MG PO TBEC
40.0000 mg | DELAYED_RELEASE_TABLET | Freq: Every day | ORAL | Status: DC
  Administered 2024-12-06 – 2024-12-13 (×8): 40 mg via ORAL
  Filled 2024-12-05 (×8): qty 1

## 2024-12-05 MED ORDER — INSULIN GLARGINE 100 UNIT/ML ~~LOC~~ SOLN: 14.0000 [IU] | Freq: Every day | SUBCUTANEOUS | Status: DC

## 2024-12-05 MED ORDER — AMLODIPINE BESYLATE 10 MG PO TABS
10.0000 mg | ORAL_TABLET | Freq: Every day | ORAL | Status: DC
  Administered 2024-12-06 – 2024-12-13 (×8): 10 mg via ORAL
  Filled 2024-12-05 (×8): qty 1

## 2024-12-05 MED ORDER — INSULIN ASPART 100 UNIT/ML IJ SOLN
0.0000 [IU] | Freq: Three times a day (TID) | INTRAMUSCULAR | Status: DC
  Administered 2024-12-05 – 2024-12-07 (×4): 1 [IU] via SUBCUTANEOUS
  Administered 2024-12-08: 2 [IU] via SUBCUTANEOUS
  Administered 2024-12-09 – 2024-12-10 (×6): 1 [IU] via SUBCUTANEOUS
  Administered 2024-12-11: 2 [IU] via SUBCUTANEOUS
  Administered 2024-12-11: 1 [IU] via SUBCUTANEOUS
  Administered 2024-12-11: 2 [IU] via SUBCUTANEOUS
  Administered 2024-12-12: 1 [IU] via SUBCUTANEOUS
  Administered 2024-12-12: 2 [IU] via SUBCUTANEOUS
  Administered 2024-12-13: 1 [IU] via SUBCUTANEOUS
  Filled 2024-12-05: qty 1
  Filled 2024-12-05: qty 2
  Filled 2024-12-05 (×8): qty 1
  Filled 2024-12-05: qty 2
  Filled 2024-12-05 (×2): qty 1
  Filled 2024-12-05: qty 2

## 2024-12-05 MED ORDER — INSULIN ASPART 100 UNIT/ML IJ SOLN: 0.0000 [IU] | Freq: Every day | INTRAMUSCULAR | Status: DC

## 2024-12-05 MED ORDER — CLONIDINE HCL 0.1 MG PO TABS
0.2000 mg | ORAL_TABLET | Freq: Three times a day (TID) | ORAL | Status: DC
  Administered 2024-12-05 – 2024-12-13 (×23): 0.2 mg via ORAL
  Filled 2024-12-05 (×23): qty 2

## 2024-12-05 MED ORDER — NIMODIPINE 30 MG PO CAPS
60.0000 mg | ORAL_CAPSULE | ORAL | Status: DC
  Administered 2024-12-05 – 2024-12-13 (×46): 60 mg via ORAL
  Filled 2024-12-05 (×52): qty 2

## 2024-12-05 MED ORDER — METOPROLOL TARTRATE 50 MG PO TABS
50.0000 mg | ORAL_TABLET | Freq: Two times a day (BID) | ORAL | Status: DC
  Administered 2024-12-05 – 2024-12-13 (×16): 50 mg via ORAL
  Filled 2024-12-05 (×6): qty 1
  Filled 2024-12-05: qty 2
  Filled 2024-12-05 (×9): qty 1

## 2024-12-05 MED ORDER — BUTALBITAL-APAP-CAFFEINE 50-325-40 MG PO TABS
2.0000 | ORAL_TABLET | Freq: Four times a day (QID) | ORAL | Status: DC
  Administered 2024-12-05 – 2024-12-13 (×31): 2 via ORAL
  Filled 2024-12-05 (×31): qty 2

## 2024-12-05 MED ORDER — HYDROXYCHLOROQUINE SULFATE 200 MG PO TABS
400.0000 mg | ORAL_TABLET | Freq: Every day | ORAL | Status: DC
  Administered 2024-12-06 – 2024-12-13 (×8): 400 mg via ORAL
  Filled 2024-12-05 (×8): qty 2

## 2024-12-05 MED ORDER — OXYCODONE HCL 5 MG PO TABS: 5.0000 mg | ORAL_TABLET | ORAL | Status: DC | PRN

## 2024-12-05 MED ORDER — MAGNESIUM CITRATE PO SOLN
1.0000 | Freq: Once | ORAL | Status: AC
  Administered 2024-12-05: 1 via ORAL
  Filled 2024-12-05: qty 296

## 2024-12-05 MED ORDER — BISACODYL 10 MG RE SUPP
10.0000 mg | Freq: Once | RECTAL | Status: AC
  Administered 2024-12-05: 10 mg via RECTAL
  Filled 2024-12-05: qty 1

## 2024-12-05 MED ORDER — BUTALBITAL-APAP-CAFFEINE 50-325-40 MG PO TABS: 2.0000 | ORAL_TABLET | Freq: Four times a day (QID) | ORAL | Status: DC

## 2024-12-05 MED ORDER — ENOXAPARIN SODIUM 40 MG/0.4ML IJ SOSY: 40.0000 mg | PREFILLED_SYRINGE | INTRAMUSCULAR | Status: DC

## 2024-12-05 MED ORDER — ALPRAZOLAM 0.25 MG PO TABS
1.0000 mg | ORAL_TABLET | Freq: Three times a day (TID) | ORAL | Status: DC
  Administered 2024-12-05 – 2024-12-06 (×3): 1 mg via ORAL
  Filled 2024-12-05 (×3): qty 4

## 2024-12-05 MED ORDER — IRBESARTAN 300 MG PO TABS
300.0000 mg | ORAL_TABLET | Freq: Every day | ORAL | Status: DC
  Administered 2024-12-06 – 2024-12-13 (×8): 300 mg via ORAL
  Filled 2024-12-05 (×8): qty 1

## 2024-12-05 MED ORDER — FENOFIBRATE 54 MG PO TABS
54.0000 mg | ORAL_TABLET | Freq: Every day | ORAL | Status: DC
  Administered 2024-12-06 – 2024-12-13 (×8): 54 mg via ORAL
  Filled 2024-12-05 (×8): qty 1

## 2024-12-05 MED ORDER — NIMODIPINE 30 MG PO CAPS: 60.0000 mg | ORAL_CAPSULE | ORAL | Status: DC

## 2024-12-05 MED ORDER — LEFLUNOMIDE 10 MG PO TABS
20.0000 mg | ORAL_TABLET | Freq: Every day | ORAL | Status: DC
  Administered 2024-12-06 – 2024-12-13 (×8): 20 mg via ORAL
  Filled 2024-12-05 (×10): qty 2

## 2024-12-05 MED ORDER — CYCLOBENZAPRINE HCL 5 MG PO TABS
5.0000 mg | ORAL_TABLET | Freq: Three times a day (TID) | ORAL | Status: DC | PRN
  Administered 2024-12-05 – 2024-12-11 (×4): 5 mg via ORAL
  Filled 2024-12-05 (×5): qty 1

## 2024-12-05 MED ORDER — CLONIDINE HCL 0.2 MG PO TABS: 0.2000 mg | ORAL_TABLET | Freq: Three times a day (TID) | ORAL | Status: DC

## 2024-12-05 MED ORDER — LIDOCAINE 5 % EX PTCH
1.0000 | MEDICATED_PATCH | CUTANEOUS | Status: DC
  Administered 2024-12-06 – 2024-12-13 (×7): 1 via TRANSDERMAL
  Filled 2024-12-05 (×9): qty 1

## 2024-12-05 MED ORDER — CYCLOBENZAPRINE HCL 5 MG PO TABS: 5.0000 mg | ORAL_TABLET | Freq: Three times a day (TID) | ORAL | Status: DC | PRN

## 2024-12-05 MED ORDER — OXYCODONE HCL 5 MG PO TABS
5.0000 mg | ORAL_TABLET | ORAL | Status: DC | PRN
  Administered 2024-12-05 – 2024-12-08 (×10): 10 mg via ORAL
  Administered 2024-12-09: 5 mg via ORAL
  Administered 2024-12-10: 10 mg via ORAL
  Filled 2024-12-05 (×13): qty 2

## 2024-12-05 MED ORDER — POLYETHYLENE GLYCOL 3350 17 G PO PACK: 17.0000 g | PACK | Freq: Every day | ORAL | Status: DC | PRN

## 2024-12-05 MED ORDER — HYDROCHLOROTHIAZIDE 12.5 MG PO TABS
25.0000 mg | ORAL_TABLET | Freq: Every day | ORAL | Status: DC
  Administered 2024-12-06: 25 mg via ORAL
  Filled 2024-12-05: qty 2

## 2024-12-05 MED ORDER — LEVOTHYROXINE SODIUM 50 MCG PO TABS
50.0000 ug | ORAL_TABLET | Freq: Every day | ORAL | Status: DC
  Administered 2024-12-06 – 2024-12-13 (×8): 50 ug via ORAL
  Filled 2024-12-05 (×8): qty 1

## 2024-12-05 NOTE — Plan of Care (Signed)
  Problem: Health Behavior/Discharge Planning: Goal: Ability to manage health-related needs will improve Outcome: Progressing   Problem: Clinical Measurements: Goal: Ability to maintain clinical measurements within normal limits will improve Outcome: Progressing Goal: Diagnostic test results will improve Outcome: Progressing Goal: Respiratory complications will improve Outcome: Progressing Goal: Cardiovascular complication will be avoided Outcome: Progressing   

## 2024-12-05 NOTE — Plan of Care (Signed)
 Did just agree to take xanax , as she has been refusing it because she said she did not want to be just sleeping all the time.  She is worried about her brother, who just had a major surgery yesterday and also is concerned about her headache with no relief.  She has spoken with Dr and also has been receiving po and IV meds, trying position changes, ice packs, and other interventions and still has the headache.  She did say that sometimes her headache has been decreasing and has even said it has gone down to a 5, as it had been 9 and 10 mostly.  Dr did explain to her that she will not be pain free but we do want to get her pain to a tolerable level that will allow her to function.  She also had not had a BM in several days and was feeling miserable because of that.  She had been receiving miralax  and senna but was declining other interventions until agreed today that she needed further intervention.  Dr did order abd xr to ensure no issues and since xr was clear, she did agree to bisacodyl  suppos and mag citrate.    Problem: Clinical Measurements: Goal: Respiratory complications will improve Outcome: Progressing   Problem: Activity: Goal: Risk for activity intolerance will decrease Outcome: Progressing   Problem: Nutrition: Goal: Adequate nutrition will be maintained Outcome: Progressing   Problem: Coping: Goal: Level of anxiety will decrease Outcome: Progressing   Problem: Education: Goal: Ability to describe self-care measures that may prevent or decrease complications (Diabetes Survival Skills Education) will improve Outcome: Progressing   Problem: Clinical Measurements: Goal: Ability to maintain clinical measurements within normal limits will improve Outcome: Progressing

## 2024-12-05 NOTE — TOC Transition Note (Signed)
 Transition of Care [facility]) - Discharge Note   Patient Details  Name: Amy Adams MRN: [id number] Date of Birth: August 11, 1966  [facility] CM/SW Contact:  Almarie CHRISTELLA Goodie, LCSW Phone Number: 12/05/2024, 10:07 AM   Clinical Narrative:   Patient discharging to CIR today, no further inpatient care management needs.    Final next level of care: IP Rehab Facility Barriers to Discharge: Barriers Resolved   Patient Goals and CMS Choice   CMS Medicare.gov Compare Post Acute Care list provided to:: Patient Choice offered to / list presented to : Patient, Parent      Discharge Placement                       Discharge Plan and Services Additional resources added to the After Visit Summary for                                       Social Drivers of Health (SDOH) Interventions SDOH Screenings   Food Insecurity: No Food Insecurity (11/28/2024)  Housing: Low Risk (11/28/2024)  Transportation Needs: No Transportation Needs (11/28/2024)  Utilities: Not At Risk (11/28/2024)  Depression (PHQ2-9): Low Risk (05/27/2024)  Tobacco Use: Low Risk (11/29/2024)     Readmission Risk Interventions     No data to display

## 2024-12-05 NOTE — Discharge Summary (Addendum)
 Physician Discharge Summary  Patient ID: Amy Adams MRN: [id number] DOB/AGE: 08/05/1966 60 y.o.  Admit date: 12/05/2024 Discharge date: 12/13/2024  Discharge Diagnoses:  Principal Problem:   Subarachnoid hemorrhage (HCC) Active Problems:   Frequent headaches   Diarrhea   Diabetes mellitus (HCC)   Primary hypertension Pain management/migraine headaches Mood stabilization Hypertension Diabetes mellitus CKD stage III Rheumatoid arthritis Glaucoma Hypothyroidism Class I obesity GERD Hyperlipidemia History of anal fissures  Discharged Condition: Stable  Significant Diagnostic Studies: DG Abd 1 View Result Date: 12/05/2024 EXAM: 2 AP PORTABLE SUPINE VIEWS XRAY OF THE ABDOMEN 12/05/2024 08:50:00 AM COMPARISON: CT abdomen and pelvis 01/08/2019. CLINICAL HISTORY: 59 year old female with abdominal distention, constipation, and subarachnoid hemorrhage earlier this month. FINDINGS: BOWEL: Nonobstructed bowel gas pattern. Average volume of retained stool. SOFT TISSUES: Incidental calcified pelvic phleboliths. BONES: Lower lumbar fusion hardware is new from the previous CT. Bulky degenerative flowing endplate osteophytes in the lower thoracic spine. No acute fracture. IMPRESSION: 1. Non-obstructed bowel gas pattern with average volume retained stool volume. Electronically signed by: Helayne Hurst MD 12/05/2024 08:54 AM EST RP Workstation: [id number]   CT HEAD WO CONTRAST ( ) Result Date: 12/02/2024 EXAM: CTA Head With Intravenous Contrast. CT Head without Contrast. CLINICAL HISTORY: Subarachnoid hemorrhage [facility]) TECHNIQUE: Axial CTA images of the head with intravenous contrast. Three-dimensional MIP/volume rendered reformations were performed. Axial computed tomography images of the head/brain performed without intravenous contrast. Dose reduction technique was used including one or more of the following: automated exposure control, adjustment of mA and kV according to patient size, and/or  iterative reconstruction. CONTRAST: 75 cc Omnipaque  IV COMPARISON: CTA and CT head 11/27/24 FINDINGS: CT HEAD: BRAIN: Mildly decreased volume and density of predominantly perimesencephalic subarachnoid hemorrhage. No mass Lesion. No CT evidence for acute territorial infarct. No midline shift or extra-axial collection. VENTRICLES: No hydrocephalus. ORBITS: The orbits are unremarkable. SINUSES AND MASTOIDS: The paranasal sinuses and mastoid air cells are clear CTA HEAD: INTERNAL CAROTID ARTERIES: The intracranial ICAs are patent with no significant stenosis. No occlusion. No aneurysm. ANTERIOR CEREBRAL ARTERIES: No significant stenosis. No occlusion. No aneurysm. MIDDLE CEREBRAL ARTERIES: No significant stenosis. No occlusion. No aneurysm. POSTERIOR CEREBRAL ARTERIES: No significant stenosis. No occlusion. No aneurysm. BASILAR ARTERY: Since 1/14, interval decrease in calibar of the basilar artery (measuring 1.2 mm on series 8 image 119, previously measuring 1.6 mm in similar location) which is concerning for vasospasm. No occlusion. No aneurysm. VERTEBRAL ARTERIES: No significant stenosis. No occlusion. No aneurysm. OTHER: SOFT TISSUES: No acute finding. No masses or lymphadenopathy. BONES: No acute osseous abnormality. Other: Findings will be conveyed to the ordering provider by the radiology assistant and documented in PACs/Clario. IMPRESSION: 1. Since 1/14, interval decrease in caliber of the basilar artery which is concerning for vasospasm. No large vessel occlusion. 2. Mildly decreased volume and density of predominantly perimesencephalic subarachnoid hemorrhage. 3. No aneurysm identified. 4. No evidence of acute infarct on the CT head. MRI could provide more sensitive evaluation. Electronically signed by: Glendia Molt MD 12/02/2024 11:02 PM EST RP Workstation: [id number]   CT ANGIO HEAD W OR WO CONTRAST Result Date: 12/02/2024 EXAM: CTA Head With Intravenous Contrast. CT Head without Contrast. CLINICAL HISTORY:  Subarachnoid hemorrhage [facility]) TECHNIQUE: Axial CTA images of the head with intravenous contrast. Three-dimensional MIP/volume rendered reformations were performed. Axial computed tomography images of the head/brain performed without intravenous contrast. Dose reduction technique was used including one or more of the following: automated exposure control, adjustment of mA and kV according to patient size, and/or  iterative reconstruction. CONTRAST: 75 cc Omnipaque  IV COMPARISON: CTA and CT head 11/27/24 FINDINGS: CT HEAD: BRAIN: Mildly decreased volume and density of predominantly perimesencephalic subarachnoid hemorrhage. No mass Lesion. No CT evidence for acute territorial infarct. No midline shift or extra-axial collection. VENTRICLES: No hydrocephalus. ORBITS: The orbits are unremarkable. SINUSES AND MASTOIDS: The paranasal sinuses and mastoid air cells are clear CTA HEAD: INTERNAL CAROTID ARTERIES: The intracranial ICAs are patent with no significant stenosis. No occlusion. No aneurysm. ANTERIOR CEREBRAL ARTERIES: No significant stenosis. No occlusion. No aneurysm. MIDDLE CEREBRAL ARTERIES: No significant stenosis. No occlusion. No aneurysm. POSTERIOR CEREBRAL ARTERIES: No significant stenosis. No occlusion. No aneurysm. BASILAR ARTERY: Since 1/14, interval decrease in calibar of the basilar artery (measuring 1.2 mm on series 8 image 119, previously measuring 1.6 mm in similar location) which is concerning for vasospasm. No occlusion. No aneurysm. VERTEBRAL ARTERIES: No significant stenosis. No occlusion. No aneurysm. OTHER: SOFT TISSUES: No acute finding. No masses or lymphadenopathy. BONES: No acute osseous abnormality. Other: Findings will be conveyed to the ordering provider by the radiology assistant and documented in PACs/Clario. IMPRESSION: 1. Since 1/14, interval decrease in caliber of the basilar artery which is concerning for vasospasm. No large vessel occlusion. 2. Mildly decreased volume and density of  predominantly perimesencephalic subarachnoid hemorrhage. 3. No aneurysm identified. 4. No evidence of acute infarct on the CT head. MRI could provide more sensitive evaluation. Electronically signed by: Glendia Molt MD 12/02/2024 11:02 PM EST RP Workstation: [id number]   ECHOCARDIOGRAM COMPLETE Result Date: 11/28/2024    ECHOCARDIOGRAM REPORT   Patient Name:   MERLE WHITEHORN [facility] Date of Exam: 11/28/2024 Medical Rec #:  [id number]        Height:       68.0 in Accession #:    [id number]       Weight:       214.7 lb Date of Birth:  [birth year]         BSA:          2.106 m Patient Age:    58 years         BP:           151/86 mmHg Patient Gender: F                HR:           115 bpm. Exam Location:  Inpatient Procedure: 2D Echo, Cardiac Doppler and Color Doppler (Both Spectral and Color            Flow Doppler were utilized during procedure). Indications:    Other abnormalities of the heart  History:        Patient has prior history of Echocardiogram examinations, most                 recent 07/07/2016. Risk Factors:Diabetes and Hypertension.  Sonographer:    Sherlean Dubin Referring Phys: [id number] JESSICA MARSHALL  Sonographer Comments: Image acquisition challenging due to patient body habitus and Image acquisition challenging due to respiratory motion. IMPRESSIONS  1. Left ventricular ejection fraction, by estimation, is >75%. The left ventricle has hyperdynamic function. The left ventricle has no regional wall motion abnormalities. Left ventricular diastolic parameters are consistent with Grade I diastolic dysfunction (impaired relaxation).  2. Right ventricular systolic function is normal. The right ventricular size is normal.  3. The mitral valve is normal in structure. No evidence of mitral valve regurgitation. No evidence of mitral stenosis.  4. The aortic valve is tricuspid. Aortic valve regurgitation  is not visualized. No aortic stenosis is present.  5. The inferior vena cava is normal in size with greater than  50% respiratory variability, suggesting right atrial pressure of 3 mmHg. Comparison(s): No prior Echocardiogram. FINDINGS  Left Ventricle: Left ventricular ejection fraction, by estimation, is >75%. The left ventricle has hyperdynamic function. The left ventricle has no regional wall motion abnormalities. The left ventricular internal cavity size was normal in size. There is no left ventricular hypertrophy. Left ventricular diastolic parameters are consistent with Grade I diastolic dysfunction (impaired relaxation). Right Ventricle: The right ventricular size is normal. Right ventricular systolic function is normal. Left Atrium: Left atrial size was normal in size. Right Atrium: Right atrial size was normal in size. Pericardium: There is no evidence of pericardial effusion. Mitral Valve: The mitral valve is normal in structure. Mild mitral annular calcification. No evidence of mitral valve regurgitation. No evidence of mitral valve stenosis. MV peak gradient, 10.8 mmHg. The mean mitral valve gradient is 4.0 mmHg. Tricuspid Valve: The tricuspid valve is normal in structure. Tricuspid valve regurgitation is trivial. No evidence of tricuspid stenosis. Aortic Valve: The aortic valve is tricuspid. Aortic valve regurgitation is not visualized. No aortic stenosis is present. Aortic valve mean gradient measures 8.0 mmHg. Aortic valve peak gradient measures 14.4 mmHg. Aortic valve area, by VTI measures 2.15  cm. Pulmonic Valve: The pulmonic valve was normal in structure. Pulmonic valve regurgitation is not visualized. No evidence of pulmonic stenosis. Aorta: The aortic root is normal in size and structure. Venous: The inferior vena cava is normal in size with greater than 50% respiratory variability, suggesting right atrial pressure of 3 mmHg. IAS/Shunts: No atrial level shunt detected by color flow Doppler.  LEFT VENTRICLE PLAX 2D LVIDd:         5.10 cm   Diastology LVIDs:         3.70 cm   LV e' medial:    8.55 cm/s LV  PW:         1.00 cm   LV E/e' medial:  11.0 LV IVS:        1.10 cm   LV e' lateral:   10.70 cm/s LVOT diam:     1.80 cm   LV E/e' lateral: 8.8 LV SV:         72 LV SV Index:   34 LVOT Area:     2.54 cm  RIGHT VENTRICLE             IVC RV Basal diam:  3.20 cm     IVC diam: 1.80 cm RV Mid diam:    2.80 cm RV S prime:     14.80 cm/s TAPSE (M-mode): 2.1 cm LEFT ATRIUM             Index        RIGHT ATRIUM           Index LA diam:        3.80 cm 1.80 cm/m   RA Area:     16.40 cm LA Vol (A2C):   43.1 ml 20.46 ml/m  RA Volume:   42.60 ml  20.22 ml/m LA Vol (A4C):   41.7 ml 19.80 ml/m LA Biplane Vol: 43.3 ml 20.56 ml/m  AORTIC VALVE AV Area (Vmax):    1.87 cm AV Area (Vmean):   1.90 cm AV Area (VTI):     2.15 cm AV Vmax:           189.50 cm/s AV Vmean:  129.500 cm/s AV VTI:            0.334 m AV Peak Grad:      14.4 mmHg AV Mean Grad:      8.0 mmHg LVOT Vmax:         139.50 cm/s LVOT Vmean:        96.500 cm/s LVOT VTI:          0.283 m LVOT/AV VTI ratio: 0.84  AORTA Ao Root diam: 2.20 cm Ao Asc diam:  3.00 cm MITRAL VALVE                TRICUSPID VALVE MV Area (PHT): 6.96 cm     TR Peak grad:   7.6 mmHg MV Area VTI:   2.68 cm     TR Vmax:        138.00 cm/s MV Peak grad:  10.8 mmHg MV Mean grad:  4.0 mmHg     SHUNTS MV Vmax:       1.64 m/s     Systemic VTI:  0.28 m MV Vmean:      97.1 cm/s    Systemic Diam: 1.80 cm MV Decel Time: 109 msec MR Peak grad: 10.8 mmHg MR Vmax:      164.00 cm/s MV E velocity: 94.40 cm/s MV A velocity: 144.00 cm/s MV E/A ratio:  0.66 Redell Shallow MD Electronically signed by Redell Shallow MD Signature Date/Time: 11/28/2024/10:38:35 AM    Final    CT ANGIO HEAD NECK W WO CM (CODE STROKE) Result Date: 11/27/2024 CLINICAL DATA:  Initial evaluation for acute neuro deficit, stroke. No other relevant history is provided. EXAM: CT ANGIOGRAPHY HEAD AND NECK WITH AND WITHOUT CONTRAST TECHNIQUE: Multidetector CT imaging of the head and neck was performed using the standard protocol  during bolus administration of intravenous contrast. Multiplanar CT image reconstructions and MIPs were obtained to evaluate the vascular anatomy. Carotid stenosis measurements (when applicable) are obtained utilizing NASCET criteria, using the distal internal carotid diameter as the denominator. RADIATION DOSE REDUCTION: This exam was performed according to the departmental dose-optimization program which includes automated exposure control, adjustment of the mA and/or kV according to patient size and/or use of iterative reconstruction technique. CONTRAST:  75mL OMNIPAQUE  IOHEXOL  350 MG/ML SOLN COMPARISON:  CT from earlier the same day FINDINGS: CTA NECK FINDINGS Aortic arch: Standard branching. Imaged portion shows no evidence of aneurysm or dissection. No significant stenosis of the major arch vessel origins. Right carotid system: No evidence of dissection, stenosis (50% or greater), or occlusion. Left carotid system: No evidence of dissection, stenosis (50% or greater), or occlusion. Vertebral arteries: Codominant. No evidence of dissection, stenosis (50% or greater), or occlusion. Skeleton: No discrete or worrisome osseous lesions. Other neck: No other acute finding. Multiple scattered thyroid  nodules, largest of which measures up to 2.1 cm on the right. These have been previously evaluated by thyroid  ultrasound on 08/22/2022. Upper chest: No other acute finding. Review of the MIP images confirms the above findings CTA HEAD FINDINGS Anterior circulation: Both internal carotid arteries widely patent through the siphons without stenosis. A1 segments patent bilaterally. Normal anterior communicating complex. Anterior cerebral arteries patent without significant stenosis. No M1 stenosis or occlusion. No proximal MCA branch occlusion. Distal MCA branches perfused and symmetric. Posterior circulation: Both V4 segments patent without significant stenosis. Both PICA patent at their origins. Basilar patent without  significant stenosis. Superior cerebral arteries patent bilaterally. Left PCA supplied via the basilar. Right PCA supplied via a hypoplastic right P1  segment and a small right posterior communicating. PCAs patent to their distal aspects without stenosis. Venous sinuses: Patent allowing for timing the contrast bolus. Anatomic variants: As above. No intracranial aneurysm or other vascular malformation. No significant vasospasm by CTA. Review of the MIP images confirms the above findings IMPRESSION: Negative CTA of the head and neck. No large vessel occlusion, hemodynamically significant stenosis, or other acute vascular abnormality. No intracranial aneurysm. These results were communicated to Dr. Jerri at 7:39 pm on 11/27/2024 by text page via the [facility] messaging system. Electronically Signed   By: Morene Hoard M.D.   On: 11/27/2024 19:44   CT HEAD CODE STROKE WO CONTRAST (LKW 0-4.5h, LVO 0-24h) Result Date: 11/27/2024 EXAM: CT HEAD WITHOUT CONTRAST 11/27/2024 06:44:30 PM TECHNIQUE: CT of the head was performed without the administration of intravenous contrast. Automated exposure control, iterative reconstruction, and/or weight based adjustment of the mA/kV was utilized to reduce the radiation dose to as low as reasonably achievable. COMPARISON: None available. CLINICAL HISTORY: Neuro deficit, acute, stroke suspected. FINDINGS: BRAIN AND VENTRICLES: Large volume subarachnoid hemorrhage in the basilar cisterns. Hemorrhage fills and slightly expands the prepontine cistern as well as blood within the suprasellar cistern. There are areas of hemorrhage involving the interpeduncular cistern, ambient cisterns, and quadrigeminal cisterns. Hemorrhage extends over the superior aspect of the cerebellar vermis. Few additional areas of subarachnoid hemorrhage posteriorly along the cerebellum. There is streak artifact at the anterior skull base without definite hemorrhage in this region. Mild hydrocephalus. No evidence of  acute infarct. No significant edema. No mass effect or midline shift. Recommend CTA for further evaluation of aneurysm. ORBITS: No acute abnormality. SINUSES: No acute abnormality. SOFT TISSUES AND SKULL: No acute soft tissue abnormality. No skull fracture. IMPRESSION: 1. Large volume subarachnoid hemorrhage centered in the basilar cisterns. 2. Mild hydrocephalus. 3. Recommend CTA for further evaluation for aneurysm. 4. Findings messaged to Dr. Jerri via the [facility] messaging systemt at 7:02 PM on 11/27/24. Electronically signed by: Donnice Mania MD 11/27/2024 07:04 PM EST RP Workstation: [id number]    Labs:  Basic Metabolic Panel: Recent Labs  Lab 12/07/24 0740 12/08/24 0711 12/09/24 0433 12/10/24 0601 12/11/24 0428  NA 129* 128* 131* 133* 133*  K 4.0 3.7 4.0 4.1 4.2  CL 92* 92* 94* 96* 97*  CO2 25 25 29 24 26   GLUCOSE 101* 142* 118* 124* 159*  BUN 25* 24* 24* 18 22*  CREATININE 1.07* 1.04* 1.03* 0.84 1.00  CALCIUM  9.9 9.2 9.0 9.6 9.1    CBC: No results for input(s): WBC, NEUTROABS, HGB, HCT, MCV, PLT in the last 168 hours.   CBG: Recent Labs  Lab 12/12/24 0545 12/12/24 1132 12/12/24 1638 12/12/24 2059 12/13/24 0612  GLUCAP 130* 159* 115* 171* 138*    Brief HPI:   BRYANN GENTZ is a 59 y.o. right-handed female with history significant for anxiety, class I obesity with BMI 32.72 hypertension, hyperlipidemia, diabetes mellitus, chronic low back pain, rheumatoid arthritis maintained on Plaquenil  as well as Arava  restless leg syndrome CKD stage III migraine headaches and anal fissures with status post anal sphincterotomy 2007 and right eye laser surgery recently for glaucoma.  Per chart review lives alone.  Mobile home 4 steps to entry.  Reportedly independent prior to admission.  Family with good support.  Presented 11/27/2024 to [facility] with headache and pain behind her right eye since recent laser surgery as well as blurred vision.  She developed some neck  spasms that triggered upper extremity and facial  numbness with left facial droop and nausea vomiting.  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  CTA negative for large vessel occlusion.  Admission chemistries unremarkable except potassium 3.4 glucose 219 creatinine 1.12 hemoglobin A1c 6.7.  Blood pressure noted to be elevated with systolics in the 180-190s.  She was given a single dose of labetalol  and started on Cleviprex  infusion and transition to Nimotop  and latest cranial CT scan 1/19 showed some concern for vasospasms of the basilar artery.  Echocardiogram with ejection fraction of 75% no regional wall motion abnormalities grade 1 diastolic dysfunction.  She was transferred to [facility] admitted to the ICU.  Neurosurgery Dr. Nancyann Burns consulted advised conservative care.  Initially placed on Keppra  for seizure prophylaxis completing course.  Patient was cleared to begin Lovenox  for DVT prophylaxis 11/29/2024.  Therapy evaluations completed due to patient's decreased functional mobility was admitted for a comprehensive rehab program.   Hospital Course: FAVEN WATTERSON was admitted to rehab 12/05/2024 for inpatient therapies to consist of PT, ST and OT at least three hours five days a week. Past admission physiatrist, therapy team and rehab RN have worked together to provide customized collaborative inpatient rehab.  Pertaining to patient'sperimesencephalic hemorrhage complicated by vasospasms currently completing course of Nimotop .  Conservative care of [facility] per neurosurgery.  Patient was cleared to begin Lovenox  for DVT prophylaxis 11/29/2024.  No bleeding episodes.  Pain management with history of migraine headaches maintained on Fioricet  as well as a lidocaine  patch with Neurontin  titrated as needed with Flexeril  and oxycodone  for breakthrough pain.  Mood stabilization with Cymbalta  60 mg twice daily and Xanax  1 mg 3 times  daily with emotional support provided.  Blood pressure monitored closely with with increased mobility maintained on multiple antihypertensive medications and would need outpatient follow-up.  Blood sugars controlled hemoglobin A1c 6.7 initially on sliding scale and Lantus  insulin  prior to admission patient on Humalog  with Mounjaro  weekly as well as Tresiba  with endocrinology services Dr. Ballin following.  Patient had been on Glucophage  in the past but had not been taking it recently.  CKD stage III creatinine baseline 1.1 follow-up chemistries.  Rheumatoid arthritis with chronic immunosuppressants follow-up rheumatology services as an outpatient.  Glaucoma with recent right eye surgery 6 weeks ago follow-up outpatient.  Hormone supplement ongoing for hypothyroidism.  Class I obesity BMI 32.72 with dietary follow-up.  Protonix  ongoing for GERD.  Fenofibrate  maintained for hyperlipidemia.   Blood pressures were monitored on TID basis and remained controlled and monitored  Diabetes has been monitored with ac/hs CBG checks and SSI was use prn for tighter BS control.   Pt advised to f/u PCP in about a week to recheck sodium/potassium levels   Rehab course: During patient's stay in rehab weekly team conferences were held to monitor patient's progress, set goals and discuss barriers to discharge. At admission, patient required minimal assist 50 feet rolling walker minimal assist sit to stand  He/She  has had improvement in activity tolerance, balance, postural control as well as ability to compensate for deficits. He/She has had improvement in functional use RUE/LUE  and RLE/LLE as well as improvement in awareness.  Patient ambulates 180-300 feet from room to main gym rolling walker contact-guard assist.  Practiced ambulation using wide-based quad cane requiring contact-guard.  Negotiated 3-6 inch hurdles using rolling walker contact-guard.  Completed functional mobility to the ADL apartment contact-guard with  verbal cues due to patient showing some decrease  perception.  Completed bed mobility with supervision to modified independent.  Required contact-guard to supervision for lower body dressing minimal assist for functional mobility back to bed supervision upper body dressing.  Gathers belongings for activities of daily living and homemaking.  Full family teaching completed plan discharge to home       Disposition:  Discharge disposition: 01-Home or Self Care        Diet: Diabetic diet  Special Instructions: No driving smoking or alcohol   Medications at discharge. 1.  Tylenol  as needed 2.  Xanax  0.5 mg p.o. 3 times daily as needed 3.  Norvasc  10 mg p.o. daily 4.  Fioricet  2 tablets every 6 hours 5.  Clonidine  0.2 mg p.o. 3 times daily 6.  Flexeril  5 mg p.o. 3 times daily as needed muscle spasms 7.  Cymbalta  60 mg p.o. twice daily 8.  Fenofibrate  54 mg p.o. daily 9.  Neurontin  600 mg p.o. 3 times daily 10.  Lopressor  50 mg p.o. twice daily 11.  Plaquenil  400 mg p.o. daily 12.  Diovan  320 mg p.o. daily 13.Arava  20 mg p.o. daily 14.  Synthroid  50 mcg p.o. daily 15.  Lidocaine  patch changes directed 16.Duragesic  patch 12 mcg change every 72 hours 17.  Oxycodone  5 to 10 mg every 4 hours as needed pain 18.  Prilosec 40 mg twice daily 19.  MiraLAX  daily as needed constipation 20.  Ambien  12.5 mg nightly as needed 21.  Mounjaro  15 mg weekly 22.  Tresiba  30 units into the skin bedtime 23.Nimotop  60 mg every 4 hours until 12/23/2024 24.  Xyzal  5 mg every evening 25.  Acular  0.5% ophthalmic solution 1 drop right eye 4 times daily 26.  Folic acid  2 mg daily 27.  Singulair  10 mg p.o. every evening 28.  Vitamin D 50,000 units every 7 days 29.  Humalog  sliding scale as directed 30.  Debrox drops right ear x 3 days twice daily  30-35 minutes were spent completing discharge summary and discharge planning Discharge Instructions     Ambulatory referral to Physical Medicine Rehab    Complete by: As directed    Moderate complexity follow-up 1 to 2 weeks [facility]        Follow-up Information     Urbano Albright, MD Follow up.   Specialty: Physical Medicine and Rehabilitation Why: Office to call for appointment Contact information: [address] [place] [address] [phone]         Mai Lynwood FALCON, MD Follow up.   Specialty: Rheumatology Why: Call for appointment Contact information: [address] [place] [address] [phone]         Tommas Pears, MD Follow up.   Specialty: Endocrinology Why: Call for appointment Contact information: [address] [place] [address] [place] [address] [phone]         Lester Golas, MD Follow up.   Specialties: Interventional Radiology, Neurosurgery, Diagnostic Radiology Why: Call for appointment Contact information: [address] [place] [address] [phone]         Arloa Elsie SAUNDERS, MD Follow up.   Specialty: Family Medicine Why: Call for appointment Contact information: [address] [place] [address] [phone]                 Signed: Toribio PARAS Angiulli 12/13/2024, 9:03 AM

## 2024-12-05 NOTE — Progress Notes (Signed)
 Report was called to nurse.  Discharged to 4W.  Pt informed of where she is going.  No signs of distress.

## 2024-12-05 NOTE — Plan of Care (Signed)
" °  Problem: Consults Goal: RH STROKE PATIENT EDUCATION Description: See Patient Education module for education specifics  Outcome: Progressing Goal: Nutrition Consult-if indicated Outcome: Progressing Goal: Diabetes Guidelines if Diabetic/Glucose > 140 Description: If diabetic or lab glucose is > 140 mg/dl - Initiate Diabetes/Hyperglycemia Guidelines & Document Interventions  Outcome: Progressing   Problem: RH BOWEL ELIMINATION Goal: RH STG MANAGE BOWEL WITH ASSISTANCE Description: STG Manage Bowel with mod I  Assistance. Outcome: Progressing Goal: RH STG MANAGE BOWEL W/MEDICATION W/ASSISTANCE Description: STG Manage Bowel with Medication with mod I Assistance. Outcome: Progressing   Problem: RH SAFETY Goal: RH STG ADHERE TO SAFETY PRECAUTIONS W/ASSISTANCE/DEVICE Description: STG Adhere to Safety Precautions With cues Assistance/Device. Outcome: Progressing   Problem: RH KNOWLEDGE DEFICIT Goal: RH STG INCREASE KNOWLEDGE OF DIABETES Description: Patient will be able to manage DM using medications and dietary modification independently Outcome: Progressing Goal: RH STG INCREASE KNOWLEDGE OF HYPERTENSION Description: Patient will be able to manage HLD using medications and dietary modification independently Outcome: Progressing Goal: RH STG INCREASE KNOWLEGDE OF HYPERLIPIDEMIA Description: Patient will be able to manage HLD using medications and dietary modification independently Outcome: Progressing Goal: RH STG INCREASE KNOWLEDGE OF STROKE PROPHYLAXIS Description: Patient will be able to manage secondary risks using medications and dietary modification independently Outcome: Progressing   "

## 2024-12-05 NOTE — H&P (Signed)
 "      Physical Medicine and Rehabilitation Admission H&P        Chief Complaint  Patient presents with   Code Stroke  : HPI: Amy Adams is a 59 year old right-handed female with past medical history significant for anxiety, class I obesity with BMI 32.72, hypertension, hyperlipidemia, diabetes mellitus with peripheral neuropathy followed by Dr.Balan, chronic low back pain, rheumatoid arthritis followed by Dr. Mai maintained on Plaquenil  as well as Arava , RLS, CKD stage III with baseline 1.1, migraine headaches, anal fissures status post anal sphincterotomy 2007 and right eye laser surgery 6 weeks ago for glaucoma.  Per chart review patient lives alone.  Mobile home with 4 steps to entry.  Reportedly independent driving prior to admission.  Family can provide assistance as needed.  Presented 11/27/2024 to [facility] with headache and pain behind her right eye since recent laser surgery as well as blurred vision.  She developed some neck spasms that triggered upper extremity and facial numbness with left facial droop and nausea/vomiting.  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  CTA negative for large vessel occlusion or hemodynamically significant stenosis.  Admission chemistries unremarkable except potassium 3.4, CO2 20, glucose 219, creatinine 1.12, hemoglobin A1c 6.7.  BP noted to be elevated with systolics in the 180-190s.  She was given a single dose of labetalol  and started on Cleviprex  infusion and transitioned to Nimotop  and latest cranial CT scan 1/19 showing some concerns for vasospasm of the basilar artery.  Echocardiogram with ejection fraction greater than 75% no regional wall motion abnormalities grade 1 diastolic dysfunction.  She was transferred to [facility] admitted to the ICU.  Neurosurgery Dr. Nancyann Burns consulted and advised conservative care.  Initially placed on Keppra  for  seizure prophylaxis completing course.  Patient was cleared to begin Lovenox  for DVT prophylaxis 11/29/2024.  Tolerating a regular consistency diet.  Therapy evaluations completed and due to patient's decreased functional mobility was admitted for a comprehensive rehab program.   Review of Systems  Constitutional:  Negative for chills and fever.  HENT:  Negative for hearing loss.   Eyes:  Positive for blurred vision. Negative for double vision.  Respiratory:  Negative for cough, shortness of breath and wheezing.   Cardiovascular:  Positive for leg swelling. Negative for chest pain and palpitations.  Gastrointestinal:  Positive for nausea and vomiting.       GERD  Genitourinary:  Negative for dysuria, flank pain and hematuria.       Stress urinary incontinence  Musculoskeletal:  Positive for back pain, joint pain, myalgias and neck pain.  Skin:  Negative for rash.  Neurological:  Positive for dizziness, sensory change, weakness and headaches.       Restless legs  Psychiatric/Behavioral:  Positive for depression. The patient has insomnia.        Anxiety  All other systems reviewed and are negative.      Past Medical History:  Diagnosis Date   Anxiety     Arthritis     Chronic foot pain, left     GERD (gastroesophageal reflux disease)     History of anal fissures     Hyperlipidemia     Hypertension     Hypothyroidism     Insulin  dependent type 2 diabetes mellitus (HCC)      endrocrinologist-- dr tommas   Migraines     Peripheral neuropathy     RA (rheumatoid arthritis) (  [facility])      rheumatologist-  dr mai   SUI (stress urinary incontinence, female)     Wears glasses               Past Surgical History:  Procedure Laterality Date   ABDOMINAL HYSTERECTOMY   1990    with Right Salpingo--ophorectomy   ANAL SPHINCTEROTOMY   01-05-2006    dr lorriane @[facility]    BUNIONECTOMY   09-25-2009   dr verta @[facility]     right great toe   CARPAL TUNNEL RELEASE Bilateral 1996;  1997    CESAREAN SECTION   1988   FOOT SURGERY   12/ 2014   dr verta    left 2nd hammertoe repair,  left 2nd metatarsal matrixectomy, left heel endoscopic plantar fasiotomy   IR CATHETER TUBE CHANGE   01/01/2019   IR RADIOLOGIST EVAL & MGMT   11/28/2018   IR RADIOLOGIST EVAL & MGMT   11/29/2018   IR RADIOLOGIST EVAL & MGMT   12/06/2018   IR RADIOLOGIST EVAL & MGMT   12/25/2018   IR RADIOLOGIST EVAL & MGMT   01/08/2019   IR RADIOLOGIST EVAL & MGMT   01/29/2019   PANNICULECTOMY N/A 10/26/2018    Procedure: PANNICULECTOMY;  Surgeon: Gladis Cough, MD;  Location: [facility] ORS;  Service: General;  Laterality: N/A;   TENDON REPAIR   09/2015    left posterior tibial and peroneal tendon repairs   TOE AMPUTATION   01/2015    right 2nd toe   TONSILLECTOMY   age 59   TRANSOBTURATOR SLING   01-08-2004   dr watt @[facility]              Family History  Problem Relation Age of Onset   Diabetes Mother     Migraines Mother     Arrhythmia Father     COPD Father     Cancer Father     Diabetes Sister          Social History:  reports that she has never smoked. She has never used smokeless tobacco. She reports that she does not drink alcohol  and does not use drugs. Allergies: [Allergies]  [Allergies]      Allergen Reactions   Dilaudid [Hydromorphone Hcl] Nausea And Vomiting      Hard to wake up   Bupropion        Other reaction(s): possibly   Doxycycline Nausea And Vomiting      Other Reaction(s): GI Intolerance   Hydromorphone        Other reaction(s): Unknown   Topiramate Other (See Comments)      Suicidal thoughts per patient   Trazodone And Nefazodone Other (See Comments)      Hallucinations/ suicidal thoughts         Medications Prior to Admission  Medication Sig Dispense Refill   acetaminophen  (TYLENOL ) 650 MG CR tablet Take 650-1,300 mg by mouth every 8 (eight) hours as needed for pain.       ALPRAZolam  (XANAX ) 1 MG tablet Take 1 tablet (1 mg total) by mouth 3 (three) times daily as needed.  (Patient taking differently: Take 1 mg by mouth 3 (three) times daily.) 270 tablet 1   chlorthalidone  (HYGROTON ) 25 MG tablet Take 1 tablet (25 mg total) by mouth daily with breakfast for blood pressure. 90 tablet 3   cloNIDine  (CATAPRES ) 0.1 MG tablet Take 0.1 mg by mouth 2 (two) times daily.       cyclobenzaprine  (FLEXERIL ) 10 MG tablet Take 1 tablet (  10 mg total) by mouth 3 (three) times daily as needed for muscle spasms. 270 tablet 0   diclofenac (VOLTAREN) 50 MG EC tablet Take 50 mg by mouth 2 (two) times daily as needed for moderate pain (pain score 4-6).       DULoxetine  (CYMBALTA ) 60 MG capsule Take 1 capsule (60 mg total) by mouth 2 (two) times daily. 180 capsule 3   fenofibrate  micronized (LOFIBRA) 134 MG capsule Take 1 capsule (134 mg total) by mouth daily. 90 capsule 3   fluconazole  (DIFLUCAN ) 150 MG tablet Take 1 tablet (150 mg total) by mouth daily. May repeat for 1 time (Patient taking differently: Take 150 mg by mouth daily as needed (yeast/infection).) 2 tablet 0   folic acid  (FOLVITE ) 1 MG tablet Take 2 tablets (2 mg total) by mouth daily. 180 tablet 3   gabapentin  (NEURONTIN ) 400 MG capsule Take 1 capsule (400 mg total) by mouth 3 (three) times daily. (Patient taking differently: Take 400 mg by mouth 2 (two) times daily.) 270 capsule 4   hydroxychloroquine  (PLAQUENIL ) 200 MG tablet Take 2 tablets (400 mg total) by mouth once daily with food or milk. 180 tablet 0   insulin  lispro (HUMALOG  KWIKPEN) 100 UNIT/ML KwikPen Inject 10-15 Units into the skin 3 (three) times daily for 30 days. (Patient taking differently: Inject 10-15 Units into the skin 2 (two) times daily.) 16 mL 5   leflunomide  (ARAVA ) 20 MG tablet Take 20 mg by mouth daily.       levocetirizine (XYZAL ) 5 MG tablet Take 1 tablet (5 mg total) by mouth every evening. 90 tablet 3   levothyroxine  (SYNTHROID ) 50 MCG tablet Take 1 tablet (50 mcg total) by mouth in the morning on an empty stomach 90 tablet 4   lidocaine  (LIDODERM )  5 % Place 1 patch onto the skin once daily, remove after 12 (twelve) hours. (Patient taking differently: Place 1 patch onto the skin daily as needed (pain).) 60 patch 3   metoprolol  succinate (TOPROL -XL) 25 MG 24 hr tablet Take 25 mg by mouth daily.       montelukast  (SINGULAIR ) 10 MG tablet Take 1 tablet (10 mg total) by mouth every evening. 90 tablet 3   MOUNJARO  15 MG/0.5ML Pen Inject 15 mg into the skin once a week.       omeprazole  (PRILOSEC) 40 MG capsule Take 1 capsule (40 mg total) by mouth 2 (two) times daily. 180 capsule 3   traMADol  (ULTRAM ) 50 MG tablet Take 1 tablet (50 mg total) by mouth 3 (three) times daily as needed. 270 tablet 1   TRESIBA FLEXTOUCH 100 UNIT/ML FlexTouch Pen Inject 30 Units into the skin at bedtime.       valACYclovir  (VALTREX ) 1000 MG tablet Take 2 tablets (2,000 mg total) by mouth 2 (two) times daily for 1 day when needed. 12 tablet 3   valsartan  (DIOVAN ) 320 MG tablet Take 1 tablet (320 mg total) by mouth daily. 90 tablet 3   Vitamin D, Ergocalciferol, (DRISDOL) 1.25 MG (50000 UNIT) CAPS capsule Take 50,000 Units by mouth every 7 (seven) days.       zolpidem  (AMBIEN  CR) 12.5 MG CR tablet Take 1 tablet (12.5 mg total) by mouth at bedtime as needed. (Patient taking differently: Take 12.5 mg by mouth at bedtime.) 90 tablet 1   ketorolac  (ACULAR ) 0.5 % ophthalmic solution Place 1 drop into the right eye 4 (four) times daily. (Patient not taking: Reported on 11/28/2024)       metFORMIN  (  GLUCOPHAGE -XR) 500 MG 24 hr tablet Take 2 tablets (1,000 mg total) by mouth 2 (two) times daily. (Patient not taking: Reported on 11/28/2024) 120 tablet 5   moxifloxacin (VIGAMOX) 0.5 % ophthalmic solution Place 1 drop into the right eye 4 (four) times daily. (Patient not taking: Reported on 11/28/2024)       prednisoLONE acetate (PRED FORTE) 1 % ophthalmic suspension Place 1 drop into the right eye 4 (four) times daily. (Patient not taking: Reported on 11/28/2024)                   Home: Home Living Family/patient expects to be discharged to:: Private residence Living Arrangements: Alone Available Help at Discharge: Family, Available 24 hours/day Type of Home: Mobile home Home Access: Stairs to enter [employer] of Steps: 5 Entrance Stairs-Rails: Left Home Layout: One level Bathroom Shower/Tub: Health Visitor: Standard Bathroom Accessibility: Yes Home Equipment: Cane - single point, [employer]ant (2 wheels), BSC/3in1 Additional Comments: staying with mom at dc as above   Functional History: Prior Function Prior Level of Function : Independent/Modified Independent, Driving Mobility Comments: ambulatory without DME   Functional Status:  Mobility: Bed Mobility Overal bed mobility: Needs Assistance Bed Mobility: Rolling, Sidelying to Sit, Sit to Sidelying Rolling: Supervision Sidelying to sit: Supervision Supine to sit: Supervision Sit to supine: Supervision Sit to sidelying: Supervision General bed mobility comments: using rails , no assist required Transfers Overall transfer level: Needs assistance Equipment used: Rolling walker (2 wheels) Transfers: Sit to/from Stand Sit to Stand: Min assist General transfer comment: for safety, balance. Ambulation/Gait Ambulation/Gait assistance: Contact guard assist, Min assist Gait Distance (Feet): 50 Feet Assistive device: Rolling walker (2 wheels) Gait Pattern/deviations: Step-through pattern, Decreased stride length, Drifts right/left General Gait Details: Ambulation today limited by pt reporting increased headache and dizziness. Pt also report light sensitivity therefore once in hallway requesting to go back. Initially CGA however up to Min A with fatigue with reciprocal gait pattern. Gait velocity: reduced Gait velocity interpretation: <1.8 ft/sec, indicate of risk for recurrent falls Stairs: Yes Stairs assistance: Contact guard assist Stair Management: Two rails, Step to  pattern, Forwards Number of Stairs: 2 General stair comments: Part of DGI however pt unable to finish test due to dizziness and fatigue.   ADL: ADL Overall ADL's : Needs assistance/impaired Grooming: Set up, Sitting Upper Body Bathing: Set up, Sitting Lower Body Bathing: Minimal assistance, Sitting/lateral leans, Sit to/from stand Upper Body Dressing : Set up, Sitting Lower Body Dressing: Minimal assistance, Sit to/from stand Lower Body Dressing Details (indicate cue type and reason): assist for changing mesh underwear. One LOB in standingfor LB ADLs with assist to correct Toilet Transfer: Minimal assistance, Ambulation, Rolling walker (2 wheels) Functional mobility during ADLs: Minimal assistance, Rolling walker (2 wheels)   Cognition: Cognition Orientation Level: Oriented X4 Cognition Arousal: Alert Behavior During Therapy: WFL for tasks assessed/performed   Physical Exam: Blood pressure (!) 143/82, pulse 67, temperature 99.7 F (37.6 C), temperature source Oral, resp. rate 19, height 5' 8 (1.727 m), weight 97.6 kg, SpO2 95%.  Constitutional: No apparent distress. Appropriate appearance for age.  Laying in bed. HENT: No JVD. Neck Supple. Eyes: PERRLA. EOMI. Visual fields grossly intact. + Pain in left eye with photophobia, EOMI, or exertion  Cardiovascular: RRR, no murmurs/rub/gallops. No Edema. Peripheral pulses 2+  Respiratory: CTAB. No rales, rhonchi, or wheezing. On RA.  Abdomen: + bowel sounds, normoactive. No distention or tenderness.  Skin: C/D/I. No apparent lesions.  Peripheral IV intact.  MSK:      No apparent deformity.       Neurologic exam:  Cognition: AAO to person, place, time and event.   + Cognitive/memory deficits, complicated by persistent pain Language: Fluent, No substitutions or neoglisms.  Mild dysarthria Memory: Difficult to assess due to significant discomfort Insight: Fair insight into current condition.  Mood: Pleasant affect, appropriate  mood.  Sensation: Reduced to light touch throughout both sides of her face, not on limbs. Reflexes: 2+ in BL UE and LEs. Negative Hoffman's and babinski signs bilaterally.   CN: Left facial droop, left vision difficulty, bilateral facial numbness/tingling Coordination: No apparent tremors or ataxia, complicated by vision/pain Spasticity: MAS 0 in all extremities.   Strength: Moving all 4 limbs antigravity against resistance, 5- out of 5 throughout.  Testing limited due to severe, worsening headaches with exertion.   Lab Results Last 48 Hours        Results for orders placed or performed during the hospital encounter of 11/27/24 (from the past 48 hours)  Glucose, capillary     Status: Abnormal    Collection Time: 12/03/24  6:05 AM  Result Value Ref Range    Glucose-Capillary 128 (H) 70 - 99 mg/dL      Comment: Glucose reference range applies only to samples taken after fasting for at least 8 hours.  Glucose, capillary     Status: Abnormal    Collection Time: 12/03/24 11:56 AM  Result Value Ref Range    Glucose-Capillary 150 (H) 70 - 99 mg/dL      Comment: Glucose reference range applies only to samples taken after fasting for at least 8 hours.  Glucose, capillary     Status: Abnormal    Collection Time: 12/03/24  4:54 PM  Result Value Ref Range    Glucose-Capillary 129 (H) 70 - 99 mg/dL      Comment: Glucose reference range applies only to samples taken after fasting for at least 8 hours.  Glucose, capillary     Status: Abnormal    Collection Time: 12/03/24  9:18 PM  Result Value Ref Range    Glucose-Capillary 191 (H) 70 - 99 mg/dL      Comment: Glucose reference range applies only to samples taken after fasting for at least 8 hours.  Glucose, capillary     Status: Abnormal    Collection Time: 12/04/24  6:29 AM  Result Value Ref Range    Glucose-Capillary 131 (H) 70 - 99 mg/dL      Comment: Glucose reference range applies only to samples taken after fasting for at least 8 hours.   Glucose, capillary     Status: Abnormal    Collection Time: 12/04/24 11:24 AM  Result Value Ref Range    Glucose-Capillary 194 (H) 70 - 99 mg/dL      Comment: Glucose reference range applies only to samples taken after fasting for at least 8 hours.  Glucose, capillary     Status: Abnormal    Collection Time: 12/04/24  4:52 PM  Result Value Ref Range    Glucose-Capillary 104 (H) 70 - 99 mg/dL      Comment: Glucose reference range applies only to samples taken after fasting for at least 8 hours.  Glucose, capillary     Status: Abnormal    Collection Time: 12/04/24  9:38 PM  Result Value Ref Range    Glucose-Capillary 121 (H) 70 - 99 mg/dL      Comment: Glucose reference range applies only to samples taken  after fasting for at least 8 hours.      Imaging Results (Last 48 hours)  No results found.         Blood pressure (!) 143/82, pulse 67, temperature 99.7 F (37.6 C), temperature source Oral, resp. rate 19, height 5' 8 (1.727 m), weight 97.6 kg, SpO2 95%.   Medical Problem List and Plan: 1. 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  Antithrombotics: -DVT/anticoagulation:  Pharmaceutical: Lovenox  initiated 11/29/2024             -antiplatelet therapy: N/A 3. Pain Management/migraine headaches: Lidocaine  patch, Neurontin  400 mg 3 times daily,, Fioricet  as needed, Flexeril  5 mg 3 times daily as needed, oxycodone  5 to 10 mg every 4 hours as needed severe pain 4. Mood/Behavior/Sleep: Cymbalta  60 mg twice daily, Xanax  1 mg 3 times daily             -antipsychotic agents: N/A 5. Neuropsych/cognition: This patient is capable of making decisions on her own behalf. 6. Skin/Wound Care: Routine skin checks 7. Fluids/Electrolytes/Nutrition: Routine ins and outs with follow-up  chemistries 8.  Hypertension.  Norvasc  10 mg daily, clonidine  0.2 mg 3 times daily, HCTZ 25 mg daily, Avapro  300 mg daily, Lopressor  50 mg twice daily.  Monitor with increased mobility 9.  Diabetes mellitus.  Hemoglobin A1c 6.7.  Currently SSI/Lantus  insulin  14 units daily.  Prior to admission patient on Humalog  10-15 units twice daily, Mounjaro  15 mg weekly, Glucophage  1000 mg twice daily.  Patient is followed by endocrinology services Dr. Tommas 10.  CKD stage III.  Creatinine baseline 1.1.  Follow-up chemistries 11.  Rheumatoid arthritis.  Chronic immunosuppressants.  Continue Plaquenil  as well as Arava .  Follow-up rheumatology services 12.  Glaucoma.  Recent right eye laser surgery 6 weeks ago.  Follow-up outpatient 13.  Hypothyroidism.  Synthroid  14.  Class I obesity.  BMI 32.72.  Dietary follow-up 15.  GERD.  Protonix  16.  Hyperlipidemia.  Fenofibrate . 17.  History of anal fissures.  Status post anal sphincterotomy 2007.  Follow-up outpatient Toribio JINNY Pitch, PA-C 12/05/2024 "

## 2024-12-05 NOTE — Discharge Summary (Signed)
 Physician Discharge Summary  Amy Adams FMW:[id number] DOB: 01-Mar-1966 DOA: 11/27/2024  PCP: Arloa Elsie SAUNDERS, MD  Admit date: 11/27/2024 Discharge date: 12/05/2024  Admitted From: Home Home Disposition: CIR  Recommendations for Outpatient Follow-up:  Follow up with PCP in 1-2 weeks Please obtain BMP/CBC in one week your next doctors visit.  Continue to address blood pressure.  Needs to have daily bowel movement, bowel regimen should be continued. Follow-up with neuro IR Blood pressure medications has been adjusted as mentioned below  Discharge Condition: Stable CODE STATUS: Full code Diet recommendation: Heart healthy  Brief/Interim Summary: Brief Narrative:   59 year old with history of neck spasm, lower extremity numbness, nausea, left facial droop reported of having laser eye surgery 6 weeks ago for glaucoma thereafter continued right sided eye pain and headache.  There after 48 hours prior to admission started experiencing blurry vision in the right eye therefore came to the hospital.  CT of the head found to have bibasilar cisternal subarachnoid hemorrhage and elevated systolic blood pressure.  She was admitted to the ICU and started on antihypertensive drip.  CTA was negative for aneurysm.  She was also started on Keppra  for prophylaxis. Persistent headache, repeated CT head on 1/19 showing some concerns of vasospasm of by basilar artery.  Neuro IR is following.  Discharge to CIR  Assessment & Plan:  Subarachnoid hemorrhage Persistent headaches - Neuro IR is following the patient.  Repeat CTA of the head showing some vasospasm of by basilar artery.  Management per neuro IR - Echocardiogram showing preserved EF - PT/OT CIR -Tylenol  and oxycodone  ordered  Vasovagal - Isolated episode on 1/18  Hypertensive emergency History of essential hypertension - Currently on Nimodipine , HCTZ, ARB, Lopressor .  Increased clonidine  recently started following outpatient with  nephrology for better blood pressure control. -Once Nimodipine  has been discontinued, transition to Norvasc   Headache, nausea and vomiting - Supportive care  Diabetes mellitus type 2 Peripheral neuropathy - Sliding scale and Accu-Chek.  A1c 6.7.  For now we will hold metformin , Mounjaro  and Tresiba.  Continue long-acting and sliding scale while at CIR. - Gabapentin   CKD stage IIIa - Creatinine around baseline of 1.1  Rheumatoid arthritis of chronic immunosuppressive's - Plaquenil   Hypothyroidism -Synthroid   Anxiety/depression - On Xanax , Cymbalta   GERD - PPI  Constipation - Continue bowel regimen   DVT prophylaxis: enoxaparin  (LOVENOX ) injection 40 mg Start: 11/29/24 2200 SCDs Start: 11/27/24 2353      Code Status: Full Code Family Communication:   DC to CIR  PT Follow up Recs: Acute Inpatient Rehab (3hours/Day)11/30/2024 1455  Subjective: Still having headache.   Examination:  General exam: Appears calm and comfortable  Respiratory system: Clear to auscultation. Respiratory effort normal. Cardiovascular system: S1 & S2 heard, RRR. No JVD, murmurs, rubs, gallops or clicks. No pedal edema. Gastrointestinal system: Abdomen is nondistended, soft and nontender. No organomegaly or masses felt. Normal bowel sounds heard. Central nervous system: Alert and oriented. No focal neurological deficits. Extremities: Symmetric 5 x 5 power. Skin: No rashes, lesions or ulcers Psychiatry: Judgement and insight appear normal. Mood & affect appropriate.    Discharge Diagnoses:  Principal Problem:   Subarachnoid hemorrhage [facility])      Discharge Exam: Vitals:   12/05/24 0444 12/05/24 0805  BP: (!) 143/82 (!) 149/78  Pulse: 67 64  Resp: 19   Temp: 99.7 F (37.6 C) 97.7 F (36.5 C)  SpO2: 95% 95%   Vitals:   12/04/24 2044 12/05/24 0102 12/05/24 0444 12/05/24 0805  BP: ROLLEN)  136/112 (!) 121/49 (!) 143/82 (!) 149/78  Pulse: 67 61 67 64  Resp: 16 15 19    Temp: 98.4 F  (36.9 C) 97.8 F (36.6 C) 99.7 F (37.6 C) 97.7 F (36.5 C)  TempSrc: Oral Oral Oral Oral  SpO2: 93%  95% 95%  Weight:      Height:          Discharge Instructions   Allergies as of 12/05/2024       Reactions   Dilaudid [hydromorphone Hcl] Nausea And Vomiting   Hard to wake up   Bupropion    Other reaction(s): possibly   Doxycycline Nausea And Vomiting   Other Reaction(s): GI Intolerance   Hydromorphone    Other reaction(s): Unknown   Topiramate Other (See Comments)   Suicidal thoughts per patient   Trazodone And Nefazodone Other (See Comments)   Hallucinations/ suicidal thoughts        Medication List     PAUSE taking these medications    metFORMIN  500 MG 24 hr tablet Wait to take this until your doctor or other care provider tells you to start again. Commonly known as: GLUCOPHAGE -XR Take 2 tablets (1,000 mg total) by mouth 2 (two) times daily.   Mounjaro  15 MG/0.5ML Pen Wait to take this until your doctor or other care provider tells you to start again. Generic drug: tirzepatide  Inject 15 mg into the skin once a week.   Tresiba FlexTouch 100 UNIT/ML FlexTouch Pen Wait to take this until your doctor or other care provider tells you to start again. Generic drug: insulin  degludec Inject 30 Units into the skin at bedtime.       STOP taking these medications    insulin  lispro 100 UNIT/ML KwikPen Commonly known as: HumaLOG  KwikPen   metoprolol  succinate 25 MG 24 hr tablet Commonly known as: TOPROL -XL   moxifloxacin 0.5 % ophthalmic solution Commonly known as: VIGAMOX   prednisoLONE acetate 1 % ophthalmic suspension Commonly known as: PRED FORTE   traMADol  50 MG tablet Commonly known as: ULTRAM        TAKE these medications    acetaminophen  650 MG CR tablet Commonly known as: TYLENOL  Take 650-1,300 mg by mouth every 8 (eight) hours as needed for pain.   ALPRAZolam  1 MG tablet Commonly known as: Xanax  Take 1 tablet (1 mg total) by mouth 3  (three) times daily as needed. What changed: when to take this   benzonatate  100 MG capsule Commonly known as: TESSALON  Take 1 capsule (100 mg total) by mouth 3 (three) times daily as needed for cough.   butalbital -acetaminophen -caffeine  50-325-40 MG tablet Commonly known as: FIORICET  Take 2 tablets by mouth every 6 (six) hours.   chlorthalidone  25 MG tablet Commonly known as: HYGROTON  Take 1 tablet (25 mg total) by mouth daily with breakfast for blood pressure.   cloNIDine  0.2 MG tablet Commonly known as: CATAPRES  Take 1 tablet (0.2 mg total) by mouth 3 (three) times daily. What changed:  medication strength how much to take when to take this   cyclobenzaprine  5 MG tablet Commonly known as: FLEXERIL  Take 1 tablet (5 mg total) by mouth 3 (three) times daily as needed for muscle spasms. What changed:  medication strength how much to take   diclofenac 50 MG EC tablet Commonly known as: VOLTAREN Take 50 mg by mouth 2 (two) times daily as needed for moderate pain (pain score 4-6).   DULoxetine  60 MG capsule Commonly known as: CYMBALTA  Take 1 capsule (60 mg total) by mouth  2 (two) times daily.   fenofibrate  micronized 134 MG capsule Commonly known as: LOFIBRA Take 1 capsule (134 mg total) by mouth daily.   fluconazole  150 MG tablet Commonly known as: Diflucan  Take 1 tablet (150 mg total) by mouth daily. May repeat for 1 time What changed:  when to take this reasons to take this   folic acid  1 MG tablet Commonly known as: FOLVITE  Take 2 tablets (2 mg total) by mouth daily.   gabapentin  400 MG capsule Commonly known as: NEURONTIN  Take 1 capsule (400 mg total) by mouth 3 (three) times daily. What changed: when to take this   hydroxychloroquine  200 MG tablet Commonly known as: Plaquenil  Take 2 tablets (400 mg total) by mouth once daily with food or milk.   insulin  aspart 100 UNIT/ML injection Commonly known as: novoLOG  Inject 0-5 Units into the skin at  bedtime.   insulin  aspart 100 UNIT/ML injection Commonly known as: novoLOG  Inject 0-9 Units into the skin 3 (three) times daily with meals.   insulin  glargine 100 UNIT/ML injection Commonly known as: LANTUS  Inject 0.14 mLs (14 Units total) into the skin daily.   ketorolac  0.5 % ophthalmic solution Commonly known as: ACULAR  Place 1 drop into the right eye 4 (four) times daily.   leflunomide  20 MG tablet Commonly known as: ARAVA  Take 20 mg by mouth daily.   levocetirizine 5 MG tablet Commonly known as: XYZAL  Take 1 tablet (5 mg total) by mouth every evening.   levothyroxine  50 MCG tablet Commonly known as: SYNTHROID  Take 1 tablet (50 mcg total) by mouth in the morning on an empty stomach   lidocaine  5 % Commonly known as: LIDODERM  Place 1 patch onto the skin once daily, remove after 12 (twelve) hours. What changed:  when to take this reasons to take this   metoprolol  tartrate 50 MG tablet Commonly known as: LOPRESSOR  Take 1 tablet (50 mg total) by mouth 2 (two) times daily.   montelukast  10 MG tablet Commonly known as: SINGULAIR  Take 1 tablet (10 mg total) by mouth every evening.   niMODipine  30 MG capsule Commonly known as: NIMOTOP  Take 2 capsules (60 mg total) by mouth every 4 (four) hours.   omeprazole  40 MG capsule Commonly known as: PRILOSEC Take 1 capsule (40 mg total) by mouth 2 (two) times daily.   oxyCODONE  5 MG immediate release tablet Commonly known as: Oxy IR/ROXICODONE  Take 1-2 tablets (5-10 mg total) by mouth every 4 (four) hours as needed for severe pain (pain score 7-10) or moderate pain (pain score 4-6).   polyethylene glycol 17 g packet Commonly known as: MIRALAX  / GLYCOLAX  Take 17 g by mouth daily as needed for mild constipation.   senna 8.6 MG Tabs tablet Commonly known as: SENOKOT Take 1 tablet (8.6 mg total) by mouth 2 (two) times daily as needed for moderate constipation.   valACYclovir  1000 MG tablet Commonly known as: VALTREX  Take  2 tablets (2,000 mg total) by mouth 2 (two) times daily for 1 day when needed.   valsartan  320 MG tablet Commonly known as: DIOVAN  Take 1 tablet (320 mg total) by mouth daily.   Vitamin D (Ergocalciferol) 1.25 MG (50000 UNIT) Caps capsule Commonly known as: DRISDOL Take 50,000 Units by mouth every 7 (seven) days.   zolpidem  12.5 MG CR tablet Commonly known as: Ambien  CR Take 1 tablet (12.5 mg total) by mouth at bedtime as needed. What changed: when to take this  Durable Medical Equipment  (From admission, onward)           Start     Ordered   12/02/24 0924  For home use only DME Walker rolling  Once       Question Answer Comment  Walker: With 5 Inch Wheels   Patient needs a walker to treat with the following condition Weakness      12/02/24 0923   12/02/24 0924  For home use only DME Bedside commode  Once       Question:  Patient needs a bedside commode to treat with the following condition  Answer:  Weakness   12/02/24 9076            Follow-up Information     Arloa Elsie SAUNDERS, MD Follow up in 1 week(s).   Specialty: Family Medicine Contact information: [address] [employer] [place] [address] [phone]         Arloa Elsie SAUNDERS, MD .   Specialty: Family Medicine Contact information: [address] [employer] [place] [address] [phone]                Allergies[1]  You were cared for by a hospitalist during your hospital stay. If you have any questions about your discharge medications or the care you received while you were in the hospital after you are discharged, you can call the unit and asked to speak with the hospitalist on call if the hospitalist that took care of you is not available. Once you are discharged, your primary care physician will handle any further medical issues. Please note that no refills for any discharge medications will be authorized once you are discharged, as it is imperative that you  return to your primary care physician (or establish a relationship with a primary care physician if you do not have one) for your aftercare needs so that they can reassess your need for medications and monitor your lab values.  You were cared for by a hospitalist during your hospital stay. If you have any questions about your discharge medications or the care you received while you were in the hospital after you are discharged, you can call the unit and asked to speak with the hospitalist on call if the hospitalist that took care of you is not available. Once you are discharged, your primary care physician will handle any further medical issues. Please note that NO REFILLS for any discharge medications will be authorized once you are discharged, as it is imperative that you return to your primary care physician (or establish a relationship with a primary care physician if you do not have one) for your aftercare needs so that they can reassess your need for medications and monitor your lab values.  Please request your Prim.MD to go over all Hospital Tests and Procedure/Radiological results at the follow up, please get all Hospital records sent to your Prim MD by signing hospital release before you go home.  Get CBC, CMP, 2 view Chest X ray checked  by Primary MD during your next visit or SNF MD in 5-7 days ( we routinely change or add medications that can affect your baseline labs and fluid status, therefore we recommend that you get the mentioned basic workup next visit with your PCP, your PCP may decide not to get them or add new tests based on their clinical decision)  On your next visit with your primary care physician please Get Medicines reviewed and adjusted.  If you experience worsening of your admission symptoms, develop shortness of breath, life threatening emergency, suicidal or homicidal thoughts you must seek medical attention immediately by calling 911 or calling your MD immediately  if  symptoms less severe.  You Must read complete instructions/literature along with all the possible adverse reactions/side effects for all the Medicines you take and that have been prescribed to you. Take any new Medicines after you have completely understood and accpet all the possible adverse reactions/side effects.   Do not drive, operate heavy machinery, perform activities at heights, swimming or participation in water activities or provide baby sitting services if your were admitted for syncope or siezures until you have seen by Primary MD or a Neurologist and advised to do so again.  Do not drive when taking Pain medications.   Procedures/Studies: DG Abd 1 View Result Date: 12/05/2024 EXAM: 2 AP PORTABLE SUPINE VIEWS XRAY OF THE ABDOMEN 12/05/2024 08:50:00 AM COMPARISON: CT abdomen and pelvis 01/08/2019. CLINICAL HISTORY: 59 year old female with abdominal distention, constipation, and subarachnoid hemorrhage earlier this month. FINDINGS: BOWEL: Nonobstructed bowel gas pattern. Average volume of retained stool. SOFT TISSUES: Incidental calcified pelvic phleboliths. BONES: Lower lumbar fusion hardware is new from the previous CT. Bulky degenerative flowing endplate osteophytes in the lower thoracic spine. No acute fracture. IMPRESSION: 1. Non-obstructed bowel gas pattern with average volume retained stool volume. Electronically signed by: Helayne Hurst MD 12/05/2024 08:54 AM EST RP Workstation: [id number]   CT HEAD WO CONTRAST ( ) Result Date: 12/02/2024 EXAM: CTA Head With Intravenous Contrast. CT Head without Contrast. CLINICAL HISTORY: Subarachnoid hemorrhage [facility]) TECHNIQUE: Axial CTA images of the head with intravenous contrast. Three-dimensional MIP/volume rendered reformations were performed. Axial computed tomography images of the head/brain performed without intravenous contrast. Dose reduction technique was used including one or more of the following: automated exposure control, adjustment  of mA and kV according to patient size, and/or iterative reconstruction. CONTRAST: 75 cc Omnipaque  IV COMPARISON: CTA and CT head 11/27/24 FINDINGS: CT HEAD: BRAIN: Mildly decreased volume and density of predominantly perimesencephalic subarachnoid hemorrhage. No mass Lesion. No CT evidence for acute territorial infarct. No midline shift or extra-axial collection. VENTRICLES: No hydrocephalus. ORBITS: The orbits are unremarkable. SINUSES AND MASTOIDS: The paranasal sinuses and mastoid air cells are clear CTA HEAD: INTERNAL CAROTID ARTERIES: The intracranial ICAs are patent with no significant stenosis. No occlusion. No aneurysm. ANTERIOR CEREBRAL ARTERIES: No significant stenosis. No occlusion. No aneurysm. MIDDLE CEREBRAL ARTERIES: No significant stenosis. No occlusion. No aneurysm. POSTERIOR CEREBRAL ARTERIES: No significant stenosis. No occlusion. No aneurysm. BASILAR ARTERY: Since 1/14, interval decrease in calibar of the basilar artery (measuring 1.2 mm on series 8 image 119, previously measuring 1.6 mm in similar location) which is concerning for vasospasm. No occlusion. No aneurysm. VERTEBRAL ARTERIES: No significant stenosis. No occlusion. No aneurysm. OTHER: SOFT TISSUES: No acute finding. No masses or lymphadenopathy. BONES: No acute osseous abnormality. Other: Findings will be conveyed to the ordering provider by the radiology assistant and documented in PACs/Clario. IMPRESSION: 1. Since 1/14, interval decrease in caliber of the basilar artery which is concerning for vasospasm. No large vessel occlusion. 2. Mildly decreased volume and density of predominantly perimesencephalic subarachnoid hemorrhage. 3. No aneurysm identified. 4. No evidence of acute infarct on the CT head. MRI could provide more sensitive evaluation. Electronically signed by: Glendia Molt MD 12/02/2024 11:02 PM EST RP Workstation: [id number]   CT ANGIO HEAD W OR WO CONTRAST Result Date: 12/02/2024 EXAM: CTA Head With Intravenous  Contrast. CT Head without  Contrast. CLINICAL HISTORY: Subarachnoid hemorrhage [facility]) TECHNIQUE: Axial CTA images of the head with intravenous contrast. Three-dimensional MIP/volume rendered reformations were performed. Axial computed tomography images of the head/brain performed without intravenous contrast. Dose reduction technique was used including one or more of the following: automated exposure control, adjustment of mA and kV according to patient size, and/or iterative reconstruction. CONTRAST: 75 cc Omnipaque  IV COMPARISON: CTA and CT head 11/27/24 FINDINGS: CT HEAD: BRAIN: Mildly decreased volume and density of predominantly perimesencephalic subarachnoid hemorrhage. No mass Lesion. No CT evidence for acute territorial infarct. No midline shift or extra-axial collection. VENTRICLES: No hydrocephalus. ORBITS: The orbits are unremarkable. SINUSES AND MASTOIDS: The paranasal sinuses and mastoid air cells are clear CTA HEAD: INTERNAL CAROTID ARTERIES: The intracranial ICAs are patent with no significant stenosis. No occlusion. No aneurysm. ANTERIOR CEREBRAL ARTERIES: No significant stenosis. No occlusion. No aneurysm. MIDDLE CEREBRAL ARTERIES: No significant stenosis. No occlusion. No aneurysm. POSTERIOR CEREBRAL ARTERIES: No significant stenosis. No occlusion. No aneurysm. BASILAR ARTERY: Since 1/14, interval decrease in calibar of the basilar artery (measuring 1.2 mm on series 8 image 119, previously measuring 1.6 mm in similar location) which is concerning for vasospasm. No occlusion. No aneurysm. VERTEBRAL ARTERIES: No significant stenosis. No occlusion. No aneurysm. OTHER: SOFT TISSUES: No acute finding. No masses or lymphadenopathy. BONES: No acute osseous abnormality. Other: Findings will be conveyed to the ordering provider by the radiology assistant and documented in PACs/Clario. IMPRESSION: 1. Since 1/14, interval decrease in caliber of the basilar artery which is concerning for vasospasm. No large  vessel occlusion. 2. Mildly decreased volume and density of predominantly perimesencephalic subarachnoid hemorrhage. 3. No aneurysm identified. 4. No evidence of acute infarct on the CT head. MRI could provide more sensitive evaluation. Electronically signed by: Glendia Molt MD 12/02/2024 11:02 PM EST RP Workstation: [id number]   ECHOCARDIOGRAM COMPLETE Result Date: 11/28/2024    ECHOCARDIOGRAM REPORT   Patient Name:   Amy Adams [facility] Date of Exam: 11/28/2024 Medical Rec #:  [id number]        Height:       68.0 in Accession #:    [id number]       Weight:       214.7 lb Date of Birth:  [birth year]         BSA:          2.106 m Patient Age:    58 years         BP:           151/86 mmHg Patient Gender: F                HR:           115 bpm. Exam Location:  Inpatient Procedure: 2D Echo, Cardiac Doppler and Color Doppler (Both Spectral and Color            Flow Doppler were utilized during procedure). Indications:    Other abnormalities of the heart  History:        Patient has prior history of Echocardiogram examinations, most                 recent 07/07/2016. Risk Factors:Diabetes and Hypertension.  Sonographer:    Sherlean Dubin Referring Phys: [id number] JESSICA MARSHALL  Sonographer Comments: Image acquisition challenging due to patient body habitus and Image acquisition challenging due to respiratory motion. IMPRESSIONS  1. Left ventricular ejection fraction, by estimation, is >75%. The left ventricle has hyperdynamic function. The left ventricle has no regional wall motion  abnormalities. Left ventricular diastolic parameters are consistent with Grade I diastolic dysfunction (impaired relaxation).  2. Right ventricular systolic function is normal. The right ventricular size is normal.  3. The mitral valve is normal in structure. No evidence of mitral valve regurgitation. No evidence of mitral stenosis.  4. The aortic valve is tricuspid. Aortic valve regurgitation is not visualized. No aortic stenosis is present.  5.  The inferior vena cava is normal in size with greater than 50% respiratory variability, suggesting right atrial pressure of 3 mmHg. Comparison(s): No prior Echocardiogram. FINDINGS  Left Ventricle: Left ventricular ejection fraction, by estimation, is >75%. The left ventricle has hyperdynamic function. The left ventricle has no regional wall motion abnormalities. The left ventricular internal cavity size was normal in size. There is no left ventricular hypertrophy. Left ventricular diastolic parameters are consistent with Grade I diastolic dysfunction (impaired relaxation). Right Ventricle: The right ventricular size is normal. Right ventricular systolic function is normal. Left Atrium: Left atrial size was normal in size. Right Atrium: Right atrial size was normal in size. Pericardium: There is no evidence of pericardial effusion. Mitral Valve: The mitral valve is normal in structure. Mild mitral annular calcification. No evidence of mitral valve regurgitation. No evidence of mitral valve stenosis. MV peak gradient, 10.8 mmHg. The mean mitral valve gradient is 4.0 mmHg. Tricuspid Valve: The tricuspid valve is normal in structure. Tricuspid valve regurgitation is trivial. No evidence of tricuspid stenosis. Aortic Valve: The aortic valve is tricuspid. Aortic valve regurgitation is not visualized. No aortic stenosis is present. Aortic valve mean gradient measures 8.0 mmHg. Aortic valve peak gradient measures 14.4 mmHg. Aortic valve area, by VTI measures 2.15  cm. Pulmonic Valve: The pulmonic valve was normal in structure. Pulmonic valve regurgitation is not visualized. No evidence of pulmonic stenosis. Aorta: The aortic root is normal in size and structure. Venous: The inferior vena cava is normal in size with greater than 50% respiratory variability, suggesting right atrial pressure of 3 mmHg. IAS/Shunts: No atrial level shunt detected by color flow Doppler.  LEFT VENTRICLE PLAX 2D LVIDd:         5.10 cm    Diastology LVIDs:         3.70 cm   LV e' medial:    8.55 cm/s LV PW:         1.00 cm   LV E/e' medial:  11.0 LV IVS:        1.10 cm   LV e' lateral:   10.70 cm/s LVOT diam:     1.80 cm   LV E/e' lateral: 8.8 LV SV:         72 LV SV Index:   34 LVOT Area:     2.54 cm  RIGHT VENTRICLE             IVC RV Basal diam:  3.20 cm     IVC diam: 1.80 cm RV Mid diam:    2.80 cm RV S prime:     14.80 cm/s TAPSE (M-mode): 2.1 cm LEFT ATRIUM             Index        RIGHT ATRIUM           Index LA diam:        3.80 cm 1.80 cm/m   RA Area:     16.40 cm LA Vol (A2C):   43.1 ml 20.46 ml/m  RA Volume:   42.60 ml  20.22 ml/m LA Vol (A4C):  41.7 ml 19.80 ml/m LA Biplane Vol: 43.3 ml 20.56 ml/m  AORTIC VALVE AV Area (Vmax):    1.87 cm AV Area (Vmean):   1.90 cm AV Area (VTI):     2.15 cm AV Vmax:           189.50 cm/s AV Vmean:          129.500 cm/s AV VTI:            0.334 m AV Peak Grad:      14.4 mmHg AV Mean Grad:      8.0 mmHg LVOT Vmax:         139.50 cm/s LVOT Vmean:        96.500 cm/s LVOT VTI:          0.283 m LVOT/AV VTI ratio: 0.84  AORTA Ao Root diam: 2.20 cm Ao Asc diam:  3.00 cm MITRAL VALVE                TRICUSPID VALVE MV Area (PHT): 6.96 cm     TR Peak grad:   7.6 mmHg MV Area VTI:   2.68 cm     TR Vmax:        138.00 cm/s MV Peak grad:  10.8 mmHg MV Mean grad:  4.0 mmHg     SHUNTS MV Vmax:       1.64 m/s     Systemic VTI:  0.28 m MV Vmean:      97.1 cm/s    Systemic Diam: 1.80 cm MV Decel Time: 109 msec MR Peak grad: 10.8 mmHg MR Vmax:      164.00 cm/s MV E velocity: 94.40 cm/s MV A velocity: 144.00 cm/s MV E/A ratio:  0.66 Redell Shallow MD Electronically signed by Redell Shallow MD Signature Date/Time: 11/28/2024/10:38:35 AM    Final    CT ANGIO HEAD NECK W WO CM (CODE STROKE) Result Date: 11/27/2024 CLINICAL DATA:  Initial evaluation for acute neuro deficit, stroke. No other relevant history is provided. EXAM: CT ANGIOGRAPHY HEAD AND NECK WITH AND WITHOUT CONTRAST TECHNIQUE: Multidetector CT imaging  of the head and neck was performed using the standard protocol during bolus administration of intravenous contrast. Multiplanar CT image reconstructions and MIPs were obtained to evaluate the vascular anatomy. Carotid stenosis measurements (when applicable) are obtained utilizing NASCET criteria, using the distal internal carotid diameter as the denominator. RADIATION DOSE REDUCTION: This exam was performed according to the departmental dose-optimization program which includes automated exposure control, adjustment of the mA and/or kV according to patient size and/or use of iterative reconstruction technique. CONTRAST:  75mL OMNIPAQUE  IOHEXOL  350 MG/ML SOLN COMPARISON:  CT from earlier the same day FINDINGS: CTA NECK FINDINGS Aortic arch: Standard branching. Imaged portion shows no evidence of aneurysm or dissection. No significant stenosis of the major arch vessel origins. Right carotid system: No evidence of dissection, stenosis (50% or greater), or occlusion. Left carotid system: No evidence of dissection, stenosis (50% or greater), or occlusion. Vertebral arteries: Codominant. No evidence of dissection, stenosis (50% or greater), or occlusion. Skeleton: No discrete or worrisome osseous lesions. Other neck: No other acute finding. Multiple scattered thyroid  nodules, largest of which measures up to 2.1 cm on the right. These have been previously evaluated by thyroid  ultrasound on 08/22/2022. Upper chest: No other acute finding. Review of the MIP images confirms the above findings CTA HEAD FINDINGS Anterior circulation: Both internal carotid arteries widely patent through the siphons without stenosis. A1 segments patent bilaterally. Normal anterior communicating complex.  Anterior cerebral arteries patent without significant stenosis. No M1 stenosis or occlusion. No proximal MCA branch occlusion. Distal MCA branches perfused and symmetric. Posterior circulation: Both V4 segments patent without significant stenosis.  Both PICA patent at their origins. Basilar patent without significant stenosis. Superior cerebral arteries patent bilaterally. Left PCA supplied via the basilar. Right PCA supplied via a hypoplastic right P1 segment and a small right posterior communicating. PCAs patent to their distal aspects without stenosis. Venous sinuses: Patent allowing for timing the contrast bolus. Anatomic variants: As above. No intracranial aneurysm or other vascular malformation. No significant vasospasm by CTA. Review of the MIP images confirms the above findings IMPRESSION: Negative CTA of the head and neck. No large vessel occlusion, hemodynamically significant stenosis, or other acute vascular abnormality. No intracranial aneurysm. These results were communicated to Dr. Jerri at 7:39 pm on 11/27/2024 by text page via the [facility] messaging system. Electronically Signed   By: Morene Hoard M.D.   On: 11/27/2024 19:44   CT HEAD CODE STROKE WO CONTRAST (LKW 0-4.5h, LVO 0-24h) Result Date: 11/27/2024 EXAM: CT HEAD WITHOUT CONTRAST 11/27/2024 06:44:30 PM TECHNIQUE: CT of the head was performed without the administration of intravenous contrast. Automated exposure control, iterative reconstruction, and/or weight based adjustment of the mA/kV was utilized to reduce the radiation dose to as low as reasonably achievable. COMPARISON: None available. CLINICAL HISTORY: Neuro deficit, acute, stroke suspected. FINDINGS: BRAIN AND VENTRICLES: Large volume subarachnoid hemorrhage in the basilar cisterns. Hemorrhage fills and slightly expands the prepontine cistern as well as blood within the suprasellar cistern. There are areas of hemorrhage involving the interpeduncular cistern, ambient cisterns, and quadrigeminal cisterns. Hemorrhage extends over the superior aspect of the cerebellar vermis. Few additional areas of subarachnoid hemorrhage posteriorly along the cerebellum. There is streak artifact at the anterior skull base without definite  hemorrhage in this region. Mild hydrocephalus. No evidence of acute infarct. No significant edema. No mass effect or midline shift. Recommend CTA for further evaluation of aneurysm. ORBITS: No acute abnormality. SINUSES: No acute abnormality. SOFT TISSUES AND SKULL: No acute soft tissue abnormality. No skull fracture. IMPRESSION: 1. Large volume subarachnoid hemorrhage centered in the basilar cisterns. 2. Mild hydrocephalus. 3. Recommend CTA for further evaluation for aneurysm. 4. Findings messaged to Dr. Jerri via the [facility] messaging systemt at 7:02 PM on 11/27/24. Electronically signed by: Donnice Mania MD 11/27/2024 07:04 PM EST RP Workstation: [id number]     The results of significant diagnostics from this hospitalization (including imaging, microbiology, ancillary and laboratory) are listed below for reference.     Microbiology: Recent Results (from the past 240 hours)  MRSA Next Gen by PCR, Nasal     Status: None   Collection Time: 11/27/24 11:53 PM   Specimen: Nasal Mucosa; Nasal Swab  Result Value Ref Range Status   MRSA by PCR Next Gen NOT DETECTED NOT DETECTED Final    Comment: (NOTE) The GeneXpert MRSA Assay (FDA approved for NASAL specimens only), is one component of a comprehensive MRSA colonization surveillance program. It is not intended to diagnose MRSA infection nor to guide or monitor treatment for MRSA infections. Test performance is not FDA approved in patients less than 32 years old. Performed at [facility], [address], [place], [place] [address]      Labs: BNP (last 3 results) No results for input(s): BNP in the last 8760 hours. Basic Metabolic Panel: No results for input(s): NA, K, CL, CO2, GLUCOSE, BUN, CREATININE, CALCIUM , MG, PHOS in the last  168 hours. Liver Function Tests: No results for input(s): AST, ALT, ALKPHOS, BILITOT, PROT, ALBUMIN in the last 168 hours. No results for input(s): LIPASE, AMYLASE in the last  168 hours. No results for input(s): AMMONIA in the last 168 hours. CBC: No results for input(s): WBC, NEUTROABS, HGB, HCT, MCV, PLT in the last 168 hours. Cardiac Enzymes: No results for input(s): CKTOTAL, CKMB, CKMBINDEX, TROPONINI in the last 168 hours. BNP: Invalid input(s): POCBNP CBG: Recent Labs  Lab 12/04/24 0629 12/04/24 1124 12/04/24 1652 12/04/24 2138 12/05/24 0622  GLUCAP 131* 194* 104* 121* 122*   D-Dimer No results for input(s): DDIMER in the last 72 hours. Hgb A1c No results for input(s): HGBA1C in the last 72 hours. Lipid Profile No results for input(s): CHOL, HDL, LDLCALC, TRIG, CHOLHDL, LDLDIRECT in the last 72 hours. Thyroid  function studies No results for input(s): TSH, T4TOTAL, T3FREE, THYROIDAB in the last 72 hours.  Invalid input(s): FREET3 Anemia work up No results for input(s): VITAMINB12, FOLATE, FERRITIN, TIBC, IRON, RETICCTPCT in the last 72 hours. Urinalysis    Component Value Date/Time   COLORURINE AMBER (A) 11/07/2018 2024   APPEARANCEUR HAZY (A) 11/07/2018 2024   LABSPEC 1.019 11/07/2018 2024   PHURINE 6.0 11/07/2018 2024   GLUCOSEU NEGATIVE 11/07/2018 2024   HGBUR NEGATIVE 11/07/2018 2024   BILIRUBINUR NEGATIVE 11/07/2018 2024   KETONESUR 5 (A) 11/07/2018 2024   PROTEINUR 30 (A) 11/07/2018 2024   NITRITE NEGATIVE 11/07/2018 2024   LEUKOCYTESUR MODERATE (A) 11/07/2018 2024   Sepsis Labs No results for input(s): WBC in the last 168 hours.  Invalid input(s): PROCALCITONIN, LACTICIDVEN Microbiology Recent Results (from the past 240 hours)  MRSA Next Gen by PCR, Nasal     Status: None   Collection Time: 11/27/24 11:53 PM   Specimen: Nasal Mucosa; Nasal Swab  Result Value Ref Range Status   MRSA by PCR Next Gen NOT DETECTED NOT DETECTED Final    Comment: (NOTE) The GeneXpert MRSA Assay (FDA approved for NASAL specimens only), is one component of a comprehensive MRSA  colonization surveillance program. It is not intended to diagnose MRSA infection nor to guide or monitor treatment for MRSA infections. Test performance is not FDA approved in patients less than 26 years old. Performed at [facility], [address] [place], [place] [address]      Time coordinating discharge:  I have spent 35 minutes face to face with the patient and on the ward discussing the patients care, assessment, plan and disposition with other care givers. >50% of the time was devoted counseling the patient about the risks and benefits of treatment/Discharge disposition and coordinating care.   SIGNED:   Burgess JAYSON Dare, MD  [facility] 12/05/2024, 10:38 AM   If 7PM-7AM, please contact night-coverage      [1]  Allergies Allergen Reactions   Dilaudid [Hydromorphone Hcl] Nausea And Vomiting    Hard to wake up   Bupropion     Other reaction(s): possibly   Doxycycline Nausea And Vomiting    Other Reaction(s): GI Intolerance   Hydromorphone     Other reaction(s): Unknown   Topiramate Other (See Comments)    Suicidal thoughts per patient   Trazodone And Nefazodone Other (See Comments)    Hallucinations/ suicidal thoughts

## 2024-12-05 NOTE — H&P (Shared)
 "   Physical Medicine and Rehabilitation Admission H&P    Chief Complaint  Patient presents with   Code Stroke  : HPI: Amy Adams is a 59 year old right-handed female with past medical history significant for anxiety, class I obesity with BMI 32.72, hypertension, hyperlipidemia, diabetes mellitus with peripheral neuropathy followed by Dr.Balan, chronic low back pain, rheumatoid arthritis followed by Dr. Mai maintained on Plaquenil  as well as Arava , RLS, CKD stage III with baseline 1.1, migraine headaches, anal fissures status post anal sphincterotomy 2007 and right eye laser surgery 6 weeks ago for glaucoma.  Per chart review patient lives alone.  Mobile home with 4 steps to entry.  Reportedly independent driving prior to admission.  Family can provide assistance as needed.  Presented 11/27/2024 to [facility] with headache and pain behind her right eye since recent laser surgery as well as blurred vision.  She developed some neck spasms that triggered upper extremity and facial numbness with left facial droop and nausea/vomiting.  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  CTA negative for large vessel occlusion or hemodynamically significant stenosis.  Admission chemistries unremarkable except potassium 3.4, CO2 20, glucose 219, creatinine 1.12, hemoglobin A1c 6.7.  BP noted to be elevated with systolics in the 180-190s.  She was given a single dose of labetalol  and started on Cleviprex  infusion and transitioned to Nimotop  and latest cranial CT scan 1/19 showing some concerns for vasospasm of the basilar artery.  Echocardiogram with ejection fraction greater than 75% no regional wall motion abnormalities grade 1 diastolic dysfunction.  She was transferred to [facility] admitted to the ICU.  Neurosurgery Dr. Nancyann Burns consulted and advised conservative care.  Initially placed on Keppra  for seizure  prophylaxis completing course.  Patient was cleared to begin Lovenox  for DVT prophylaxis 11/29/2024.  Tolerating a regular consistency diet.  Therapy evaluations completed and due to patient's decreased functional mobility was admitted for a comprehensive rehab program.  Review of Systems  Constitutional:  Negative for chills and fever.  HENT:  Negative for hearing loss.   Eyes:  Positive for blurred vision. Negative for double vision.  Respiratory:  Negative for cough, shortness of breath and wheezing.   Cardiovascular:  Positive for leg swelling. Negative for chest pain and palpitations.  Gastrointestinal:  Positive for nausea and vomiting.       GERD  Genitourinary:  Negative for dysuria, flank pain and hematuria.       Stress urinary incontinence  Musculoskeletal:  Positive for back pain, joint pain, myalgias and neck pain.  Skin:  Negative for rash.  Neurological:  Positive for dizziness, sensory change, weakness and headaches.       Restless legs  Psychiatric/Behavioral:  Positive for depression. The patient has insomnia.        Anxiety  All other systems reviewed and are negative.  Past Medical History:  Diagnosis Date   Anxiety    Arthritis    Chronic foot pain, left    GERD (gastroesophageal reflux disease)    History of anal fissures    Hyperlipidemia    Hypertension    Hypothyroidism    Insulin  dependent type 2 diabetes mellitus (HCC)    endrocrinologist-- dr tommas   Migraines    Peripheral neuropathy    RA (rheumatoid arthritis) [facility])    rheumatologist-  dr mai   SUI (stress urinary incontinence, female)    Wears glasses    Past  Surgical History:  Procedure Laterality Date   ABDOMINAL HYSTERECTOMY  1990   with Right Salpingo--ophorectomy   ANAL SPHINCTEROTOMY  01-05-2006    dr lorriane @[facility]    BUNIONECTOMY  09-25-2009   dr verta @[facility]    right great toe   CARPAL TUNNEL RELEASE Bilateral 1996;  1997   CESAREAN SECTION  1988   FOOT SURGERY  12/ 2014   dr  verta   left 2nd hammertoe repair,  left 2nd metatarsal matrixectomy, left heel endoscopic plantar fasiotomy   IR CATHETER TUBE CHANGE  01/01/2019   IR RADIOLOGIST EVAL & MGMT  11/28/2018   IR RADIOLOGIST EVAL & MGMT  11/29/2018   IR RADIOLOGIST EVAL & MGMT  12/06/2018   IR RADIOLOGIST EVAL & MGMT  12/25/2018   IR RADIOLOGIST EVAL & MGMT  01/08/2019   IR RADIOLOGIST EVAL & MGMT  01/29/2019   PANNICULECTOMY N/A 10/26/2018   Procedure: PANNICULECTOMY;  Surgeon: Gladis Cough, MD;  Location: [facility] ORS;  Service: General;  Laterality: N/A;   TENDON REPAIR  09/2015   left posterior tibial and peroneal tendon repairs   TOE AMPUTATION  01/2015   right 2nd toe   TONSILLECTOMY  age 30   TRANSOBTURATOR SLING  01-08-2004   dr watt @[facility]    Family History  Problem Relation Age of Onset   Diabetes Mother    Migraines Mother    Arrhythmia Father    COPD Father    Cancer Father    Diabetes Sister    Social History:  reports that she has never smoked. She has never used smokeless tobacco. She reports that she does not drink alcohol  and does not use drugs. Allergies: Allergies[1] Medications Prior to Admission  Medication Sig Dispense Refill   acetaminophen  (TYLENOL ) 650 MG CR tablet Take 650-1,300 mg by mouth every 8 (eight) hours as needed for pain.     ALPRAZolam  (XANAX ) 1 MG tablet Take 1 tablet (1 mg total) by mouth 3 (three) times daily as needed. (Patient taking differently: Take 1 mg by mouth 3 (three) times daily.) 270 tablet 1   chlorthalidone  (HYGROTON ) 25 MG tablet Take 1 tablet (25 mg total) by mouth daily with breakfast for blood pressure. 90 tablet 3   cloNIDine  (CATAPRES ) 0.1 MG tablet Take 0.1 mg by mouth 2 (two) times daily.     cyclobenzaprine  (FLEXERIL ) 10 MG tablet Take 1 tablet (10 mg total) by mouth 3 (three) times daily as needed for muscle spasms. 270 tablet 0   diclofenac (VOLTAREN) 50 MG EC tablet Take 50 mg by mouth 2 (two) times daily as needed for moderate pain (pain score  4-6).     DULoxetine  (CYMBALTA ) 60 MG capsule Take 1 capsule (60 mg total) by mouth 2 (two) times daily. 180 capsule 3   fenofibrate  micronized (LOFIBRA) 134 MG capsule Take 1 capsule (134 mg total) by mouth daily. 90 capsule 3   fluconazole  (DIFLUCAN ) 150 MG tablet Take 1 tablet (150 mg total) by mouth daily. May repeat for 1 time (Patient taking differently: Take 150 mg by mouth daily as needed (yeast/infection).) 2 tablet 0   folic acid  (FOLVITE ) 1 MG tablet Take 2 tablets (2 mg total) by mouth daily. 180 tablet 3   gabapentin  (NEURONTIN ) 400 MG capsule Take 1 capsule (400 mg total) by mouth 3 (three) times daily. (Patient taking differently: Take 400 mg by mouth 2 (two) times daily.) 270 capsule 4   hydroxychloroquine  (PLAQUENIL ) 200 MG tablet Take 2 tablets (400 mg total) by mouth  once daily with food or milk. 180 tablet 0   insulin  lispro (HUMALOG  KWIKPEN) 100 UNIT/ML KwikPen Inject 10-15 Units into the skin 3 (three) times daily for 30 days. (Patient taking differently: Inject 10-15 Units into the skin 2 (two) times daily.) 16 mL 5   leflunomide  (ARAVA ) 20 MG tablet Take 20 mg by mouth daily.     levocetirizine (XYZAL ) 5 MG tablet Take 1 tablet (5 mg total) by mouth every evening. 90 tablet 3   levothyroxine  (SYNTHROID ) 50 MCG tablet Take 1 tablet (50 mcg total) by mouth in the morning on an empty stomach 90 tablet 4   lidocaine  (LIDODERM ) 5 % Place 1 patch onto the skin once daily, remove after 12 (twelve) hours. (Patient taking differently: Place 1 patch onto the skin daily as needed (pain).) 60 patch 3   metoprolol  succinate (TOPROL -XL) 25 MG 24 hr tablet Take 25 mg by mouth daily.     montelukast  (SINGULAIR ) 10 MG tablet Take 1 tablet (10 mg total) by mouth every evening. 90 tablet 3   MOUNJARO  15 MG/0.5ML Pen Inject 15 mg into the skin once a week.     omeprazole  (PRILOSEC) 40 MG capsule Take 1 capsule (40 mg total) by mouth 2 (two) times daily. 180 capsule 3   traMADol  (ULTRAM ) 50 MG  tablet Take 1 tablet (50 mg total) by mouth 3 (three) times daily as needed. 270 tablet 1   TRESIBA FLEXTOUCH 100 UNIT/ML FlexTouch Pen Inject 30 Units into the skin at bedtime.     valACYclovir  (VALTREX ) 1000 MG tablet Take 2 tablets (2,000 mg total) by mouth 2 (two) times daily for 1 day when needed. 12 tablet 3   valsartan  (DIOVAN ) 320 MG tablet Take 1 tablet (320 mg total) by mouth daily. 90 tablet 3   Vitamin D, Ergocalciferol, (DRISDOL) 1.25 MG (50000 UNIT) CAPS capsule Take 50,000 Units by mouth every 7 (seven) days.     zolpidem  (AMBIEN  CR) 12.5 MG CR tablet Take 1 tablet (12.5 mg total) by mouth at bedtime as needed. (Patient taking differently: Take 12.5 mg by mouth at bedtime.) 90 tablet 1   ketorolac  (ACULAR ) 0.5 % ophthalmic solution Place 1 drop into the right eye 4 (four) times daily. (Patient not taking: Reported on 11/28/2024)     metFORMIN  (GLUCOPHAGE -XR) 500 MG 24 hr tablet Take 2 tablets (1,000 mg total) by mouth 2 (two) times daily. (Patient not taking: Reported on 11/28/2024) 120 tablet 5   moxifloxacin (VIGAMOX) 0.5 % ophthalmic solution Place 1 drop into the right eye 4 (four) times daily. (Patient not taking: Reported on 11/28/2024)     prednisoLONE acetate (PRED FORTE) 1 % ophthalmic suspension Place 1 drop into the right eye 4 (four) times daily. (Patient not taking: Reported on 11/28/2024)        Home: Home Living Family/patient expects to be discharged to:: Private residence Living Arrangements: Alone Available Help at Discharge: Family, Available 24 hours/day Type of Home: Mobile home Home Access: Stairs to enter [employer] of Steps: 5 Entrance Stairs-Rails: Left Home Layout: One level Bathroom Shower/Tub: Health Visitor: Standard Bathroom Accessibility: Yes Home Equipment: Cane - single point, [employer] (2 wheels), BSC/3in1 Additional Comments: staying with mom at dc as above   Functional History: Prior Function Prior Level  of Function : Independent/Modified Independent, Driving Mobility Comments: ambulatory without DME  Functional Status:  Mobility: Bed Mobility Overal bed mobility: Needs Assistance Bed Mobility: Rolling, Sidelying to Sit, Sit to Sidelying Rolling: Supervision  Sidelying to sit: Supervision Supine to sit: Supervision Sit to supine: Supervision Sit to sidelying: Supervision General bed mobility comments: using rails , no assist required Transfers Overall transfer level: Needs assistance Equipment used: Rolling walker (2 wheels) Transfers: Sit to/from Stand Sit to Stand: Min assist General transfer comment: for safety, balance. Ambulation/Gait Ambulation/Gait assistance: Contact guard assist, Min assist Gait Distance (Feet): 50 Feet Assistive device: Rolling walker (2 wheels) Gait Pattern/deviations: Step-through pattern, Decreased stride length, Drifts right/left General Gait Details: Ambulation today limited by pt reporting increased headache and dizziness. Pt also report light sensitivity therefore once in hallway requesting to go back. Initially CGA however up to Min A with fatigue with reciprocal gait pattern. Gait velocity: reduced Gait velocity interpretation: <1.8 ft/sec, indicate of risk for recurrent falls Stairs: Yes Stairs assistance: Contact guard assist Stair Management: Two rails, Step to pattern, Forwards Number of Stairs: 2 General stair comments: Part of DGI however pt unable to finish test due to dizziness and fatigue.    ADL: ADL Overall ADL's : Needs assistance/impaired Grooming: Set up, Sitting Upper Body Bathing: Set up, Sitting Lower Body Bathing: Minimal assistance, Sitting/lateral leans, Sit to/from stand Upper Body Dressing : Set up, Sitting Lower Body Dressing: Minimal assistance, Sit to/from stand Lower Body Dressing Details (indicate cue type and reason): assist for changing mesh underwear. One LOB in standingfor LB ADLs with assist to  correct Toilet Transfer: Minimal assistance, Ambulation, Rolling walker (2 wheels) Functional mobility during ADLs: Minimal assistance, Rolling walker (2 wheels)  Cognition: Cognition Orientation Level: Oriented X4 Cognition Arousal: Alert Behavior During Therapy: WFL for tasks assessed/performed  Physical Exam: Blood pressure (!) 143/82, pulse 67, temperature 99.7 F (37.6 C), temperature source Oral, resp. rate 19, height 5' 8 (1.727 m), weight 97.6 kg, SpO2 95%. Physical Exam Neurological:     Comments: Patient is alert with complaints of right eye pain.  Sitting up in bed.  Makes eye contact with examiner.  Follows simple commands.  Provides name and age.     Results for orders placed or performed during the hospital encounter of 11/27/24 (from the past 48 hours)  Glucose, capillary     Status: Abnormal   Collection Time: 12/03/24  6:05 AM  Result Value Ref Range   Glucose-Capillary 128 (H) 70 - 99 mg/dL    Comment: Glucose reference range applies only to samples taken after fasting for at least 8 hours.  Glucose, capillary     Status: Abnormal   Collection Time: 12/03/24 11:56 AM  Result Value Ref Range   Glucose-Capillary 150 (H) 70 - 99 mg/dL    Comment: Glucose reference range applies only to samples taken after fasting for at least 8 hours.  Glucose, capillary     Status: Abnormal   Collection Time: 12/03/24  4:54 PM  Result Value Ref Range   Glucose-Capillary 129 (H) 70 - 99 mg/dL    Comment: Glucose reference range applies only to samples taken after fasting for at least 8 hours.  Glucose, capillary     Status: Abnormal   Collection Time: 12/03/24  9:18 PM  Result Value Ref Range   Glucose-Capillary 191 (H) 70 - 99 mg/dL    Comment: Glucose reference range applies only to samples taken after fasting for at least 8 hours.  Glucose, capillary     Status: Abnormal   Collection Time: 12/04/24  6:29 AM  Result Value Ref Range   Glucose-Capillary 131 (H) 70 - 99  mg/dL    Comment: Glucose  reference range applies only to samples taken after fasting for at least 8 hours.  Glucose, capillary     Status: Abnormal   Collection Time: 12/04/24 11:24 AM  Result Value Ref Range   Glucose-Capillary 194 (H) 70 - 99 mg/dL    Comment: Glucose reference range applies only to samples taken after fasting for at least 8 hours.  Glucose, capillary     Status: Abnormal   Collection Time: 12/04/24  4:52 PM  Result Value Ref Range   Glucose-Capillary 104 (H) 70 - 99 mg/dL    Comment: Glucose reference range applies only to samples taken after fasting for at least 8 hours.  Glucose, capillary     Status: Abnormal   Collection Time: 12/04/24  9:38 PM  Result Value Ref Range   Glucose-Capillary 121 (H) 70 - 99 mg/dL    Comment: Glucose reference range applies only to samples taken after fasting for at least 8 hours.   No results found.    Blood pressure (!) 143/82, pulse 67, temperature 99.7 F (37.6 C), temperature source Oral, resp. rate 19, height 5' 8 (1.727 m), weight 97.6 kg, SpO2 95%.  Medical Problem List and Plan: 1. 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  Antithrombotics: -DVT/anticoagulation:  Pharmaceutical: Lovenox  initiated 11/29/2024  -antiplatelet therapy: N/A 3. Pain Management/migraine headaches: Lidocaine  patch, Neurontin  400 mg 3 times daily,, Fioricet  as needed, Flexeril  5 mg 3 times daily as needed, oxycodone  5 to 10 mg every 4 hours as needed severe pain 4. Mood/Behavior/Sleep: Cymbalta  60 mg twice daily, Xanax  1 mg 3 times daily  -antipsychotic agents: N/A 5. Neuropsych/cognition: This patient is capable of making decisions on her own behalf. 6. Skin/Wound Care: Routine skin checks 7. Fluids/Electrolytes/Nutrition: Routine ins and outs with follow-up chemistries 8.  Hypertension.  Norvasc  10 mg daily, clonidine   0.2 mg 3 times daily, HCTZ 25 mg daily, Avapro  300 mg daily, Lopressor  50 mg twice daily.  Monitor with increased mobility 9.  Diabetes mellitus.  Hemoglobin A1c 6.7.  Currently SSI/Lantus  insulin  14 units daily.  Prior to admission patient on Humalog  10-15 units twice daily, Mounjaro  15 mg weekly, Glucophage  1000 mg twice daily.  Patient is followed by endocrinology services Dr. Tommas 10.  CKD stage III.  Creatinine baseline 1.1.  Follow-up chemistries 11.  Rheumatoid arthritis.  Chronic immunosuppressants.  Continue Plaquenil  as well as Arava .  Follow-up rheumatology services 12.  Glaucoma.  Recent right eye laser surgery 6 weeks ago.  Follow-up outpatient 13.  Hypothyroidism.  Synthroid  14.  Class I obesity.  BMI 32.72.  Dietary follow-up 15.  GERD.  Protonix  16.  Hyperlipidemia.  Fenofibrate . 17.  History of anal fissures.  Status post anal sphincterotomy 2007.  Follow-up outpatient Toribio JINNY Pitch, PA-C 12/05/2024     [1]  Allergies Allergen Reactions   Dilaudid [Hydromorphone Hcl] Nausea And Vomiting    Hard to wake up   Bupropion     Other reaction(s): possibly   Doxycycline Nausea And Vomiting    Other Reaction(s): GI Intolerance   Hydromorphone     Other reaction(s): Unknown   Topiramate Other (See Comments)    Suicidal thoughts per patient   Trazodone And Nefazodone Other (See Comments)    Hallucinations/ suicidal thoughts   "

## 2024-12-05 NOTE — Progress Notes (Signed)
" °  Amy Adams is a 59 y.o. female, she is currently 8 days into her hospital stay for bilateral cistern SAH with mild hydrocephalus.   No family is present at the bedside.   Assessment:  Today the patient is alert and oriented time, date, place, city, president.  The patients pain is currently a 3/10, which has Has improved\ since the last assessment. The patient describes the pain as A radiation from her right eye to her ear. Currently pain is managed by Acetaminophen  1000mg  PO TID, Fioricet .  she reports little resolution of  symptoms. Current symptoms include headache with photophobia and some radiating neck pain.   No neurological deficits at this time.    Plan:   I had the pleasure of seeing Amy Adams today.  She continues to be in pain though it seems to have improved considerably this morning compared to yesterday when I saw her. She describs the pain as a radiation from her right eye to her ear; however, the pain is expected with the subarachnoid hemorrhage that she had. She has placed a cold rag over her eye and reports that this helps.  No neurological changes or deficits at this time.   Today she is cleared for discharge. I have spoken with the attending and the plan is CIR.  She has Nimodipine  (Nimotop ) on board to prevent spasms.    Bowel:  Instruct the patient not to strain/bear down during bowel movements to prevent rises in ICP.     "

## 2024-12-05 NOTE — Discharge Instructions (Addendum)
 Inpatient Rehab Discharge Instructions  Amy Adams Discharge date and time: No discharge date for patient encounter.   Activities/Precautions/ Functional Status: Activity: As tolerated Diet: Diabetic diet Wound Care: Routine skin checks Functional status:  ___ No restrictions     ___ Walk up steps independently ___ 24/7 supervision/assistance   ___ Walk up steps with assistance ___ Intermittent supervision/assistance  _x__ Bathe/dress independently ___ Walk with walker     ___ Bathe/dress with assistance ___ Walk Independently    ___ Shower independently ___ Walk with assistance    ___ Shower with assistance ___ No alcohol      ___ Return to work/school ________  Special Instructions: No driving smoking or alcohol   COMMUNITY REFERRALS UPON DISCHARGE:    Outpatient: PT                  Agency: [place] [facility] Phone: [phone]              Appointment Date/Time: *Please expect follow-up within 7-10 business days to schedule your appointment. If you have not received follow-up, be sure to contact the site directly.*  My questions have been answered and I understand these instructions. I will adhere to these goals and the provided educational materials after my discharge from the hospital.  Patient/Caregiver Signature _______________________________ Date __________  Clinician Signature _______________________________________ Date __________  Please bring this form and your medication list with you to all your follow-up doctor's appointments.

## 2024-12-05 NOTE — Progress Notes (Signed)
 Pt arrived on unit

## 2024-12-05 NOTE — Progress Notes (Signed)
 PMR Admission Coordinator Pre-Admission Assessment   Patient: Amy Adams is an 59 y.o., female MRN: [id number] DOB: 1966-02-26 Height: 5' 8 (172.7 cm) Weight: 97.6 kg   Insurance Information HMO: yes    PPO:      PCP:      IPA:      80/20:      OTHER:  [employer] [place] [employer]      Policy#:  [id number]   Subscriber: patient Lonell Pizza at fax: [phone], (p) [phone] ext [phone] Pre-Cert#: [id number]  I received  auth for CIR from Sophie with [employer] for admit 12/04/24 through 12/09/24.  Updates due to Dana by 12/11/24 at fax listed above. Eff Date: 11/14/2024 - 11/13/2025 Deductible: does not have one OOP Max: $2,200 ($72.32 met) CIR: 75% coverage, 25% co-insurance SNF: 75% coverage, 25% co-insurance Outpatient: 100% coverage; limited to 30 combined visits/cal yr (30 remaining) Home Health:  75% coverage, 25% co-insurance DME: 75% coverage, 25% co-insurance Providers: in-network    SECONDARY:       Policy#:      Phone#:    Artist:       Phone#:    The [employer] for patients in Inpatient Rehabilitation Facilities with attached Privacy Act Statement-Health Care Records was provided and verbally reviewed with: Patient   Emergency Contact Information Contact Information       Name Relation Home Work Mobile    [place] Mother [phone]   [phone]    Jenny Leach Daughter     [phone]    Deardra, Hinkley     [phone]         Other Contacts   None on File        Current Medical History  Patient Admitting Diagnosis: SAH   History of Present Illness: A 59 year old right-handed female with past medical history significant for anxiety, class I obesity with BMI 32.72, hypertension, hyperlipidemia, diabetes mellitus with peripheral neuropathy followed by Dr.Balan, chronic low back pain, rheumatoid arthritis followed by Dr. Mai maintained on Plaquenil  as well as Arava , RLS, CKD stage III with  baseline 1.1, migraine headaches, anal fissures status post anal sphincterotomy 2007 and right eye laser surgery 6 weeks ago for glaucoma.  Per chart review patient lives alone.  Mobile home with 4 steps to entry.  Reportedly independent driving prior to admission.  Family can provide assistance as needed.  Presented 11/27/2024 to [facility] with headache and pain behind her right eye since recent laser surgery as well as blurred vision.  She developed some neck spasms that triggered upper extremity and facial numbness with left facial droop and nausea/vomiting.  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  CTA negative for large vessel occlusion or hemodynamically significant stenosis.  Admission chemistries unremarkable except potassium 3.4, CO2 20, glucose 219, creatinine 1.12, hemoglobin A1c 6.7.  BP noted to be elevated with systolics in the 180-190s.  She was given a single dose of labetalol  and started on Cleviprex  infusion and transitioned to Nimotop  and latest cranial CT scan 1/19 showing some concerns for vasospasm of the basilar artery.  Echocardiogram with ejection fraction greater than 75% no regional wall motion abnormalities grade 1 diastolic dysfunction.  She was transferred to [facility] admitted to the ICU.  Neurosurgery Dr. Nancyann Burns consulted and advised conservative care.  Initially placed on Keppra  for seizure prophylaxis completing course.  Patient was cleared to begin Lovenox  for DVT prophylaxis 11/29/2024.  Tolerating a regular consistency diet.  Therapy evaluations completed and due to patient's decreased functional mobility patient to be admitted for a comprehensive inpatient rehab program.    Complete NIHSS TOTAL: 0   Patient[facility] has been reviewed by the rehabilitation admission coordinator and physician.   Past Medical History      Past Medical History:   Diagnosis Date   Anxiety     Arthritis     Chronic foot pain, left     GERD (gastroesophageal reflux disease)     History of anal fissures     Hyperlipidemia     Hypertension     Hypothyroidism     Insulin  dependent type 2 diabetes mellitus (HCC)      endrocrinologist-- dr tommas   Migraines     Peripheral neuropathy     RA (rheumatoid arthritis) [facility])      rheumatologist-  dr mai   SUI (stress urinary incontinence, female)     Wears glasses            Has the patient had major surgery during 100 days prior to admission? No   Family History   family history includes Arrhythmia in her father; COPD in her father; Cancer in her father; Diabetes in her mother and sister; Migraines in her mother.   Current Medications [Current Medications]  [Current Medications]    Current Facility-Administered Medications:    acetaminophen  (TYLENOL ) tablet 1,000 mg, 1,000 mg, Oral, Q8H PRN, Amin, Ankit C, MD   ALPRAZolam  (XANAX ) tablet 1 mg, 1 mg, Oral, TID, Gretta Doffing P, DO, 1 mg at 11/29/24 1205   amLODipine  (NORVASC ) tablet 10 mg, 10 mg, Oral, Daily, Gretta Doffing P, DO, 10 mg at 12/05/24 9055   benzonatate  (TESSALON ) capsule 100 mg, 100 mg, Oral, TID PRN, Amin, Ankit C, MD, 100 mg at 12/04/24 2106   bisacodyl  (DULCOLAX) suppository 10 mg, 10 mg, Rectal, Once, Amin, Ankit C, MD   butalbital -acetaminophen -caffeine  (FIORICET ) 50-325-40 MG per tablet 2 tablet, 2 tablet, Oral, Q6H, Hunsucker, Donnice SAUNDERS, MD, 2 tablet at 12/05/24 0944   Chlorhexidine  Gluconate Cloth 2 % PADS 6 each, 6 each, Topical, Daily, Layman Raisin, DO, 6 each at 11/29/24 0907   cloNIDine  (CATAPRES ) tablet 0.2 mg, 0.2 mg, Oral, TID, Amin, Ankit C, MD, 0.2 mg at 12/05/24 0943   cyclobenzaprine  (FLEXERIL ) tablet 5 mg, 5 mg, Oral, TID PRN, Amin, Ankit C, MD, 5 mg at 12/04/24 2106   DULoxetine  (CYMBALTA ) DR capsule 60 mg, 60 mg, Oral, BID, Hunsucker, Donnice SAUNDERS, MD, 60 mg at 12/05/24 9056   enoxaparin  (LOVENOX ) injection  40 mg, 40 mg, Subcutaneous, Q24H, Lester Golas, MD, 40 mg at 12/04/24 2107   fenofibrate  tablet 54 mg, 54 mg, Oral, Daily, Amin, Ankit C, MD, 54 mg at 12/05/24 0947   fentaNYL  (SUBLIMAZE ) injection 50 mcg, 50 mcg, Intravenous, Q2H PRN, Hunsucker, Donnice SAUNDERS, MD, 50 mcg at 12/02/24 2034   gabapentin  (NEURONTIN ) capsule 400 mg, 400 mg, Oral, TID, Hunsucker, Donnice SAUNDERS, MD, 400 mg at 12/05/24 0944   hydrochlorothiazide  (HYDRODIURIL ) tablet 25 mg, 25 mg, Oral, Daily, Hunsucker, Donnice SAUNDERS, MD, 25 mg at 12/05/24 0944   hydroxychloroquine  (PLAQUENIL ) tablet 400 mg, 400 mg, Oral, Daily, Hunsucker, Donnice SAUNDERS, MD, 400 mg at 12/05/24 0946   insulin  aspart (novoLOG ) injection 0-5 Units, 0-5 Units, Subcutaneous, QHS, Opyd, Timothy S, MD, 2 Units at 12/01/24 2225   insulin  aspart (novoLOG ) injection 0-9 Units, 0-9 Units, Subcutaneous, TID WC, Opyd, Timothy  S, MD, 1 Units at 12/05/24 9356   insulin  glargine (LANTUS ) injection 14 Units, 14 Units, Subcutaneous, Daily, Amin, Ankit C, MD, 14 Units at 12/05/24 0947   irbesartan  (AVAPRO ) tablet 300 mg, 300 mg, Oral, Daily, Hunsucker, Donnice SAUNDERS, MD, 300 mg at 12/05/24 0944   labetalol  (NORMODYNE ) injection 20 mg, 20 mg, Intravenous, Q4H PRN, Gretta Leita SQUIBB, DO, 20 mg at 12/03/24 9680   leflunomide  (ARAVA ) tablet 20 mg, 20 mg, Oral, Daily, Hunsucker, Donnice SAUNDERS, MD, 20 mg at 12/05/24 9052   levothyroxine  (SYNTHROID ) tablet 50 mcg, 50 mcg, Oral, Q0600, Hunsucker, Donnice SAUNDERS, MD, 50 mcg at 12/05/24 9495   lidocaine  (LIDODERM ) 5 % 1 patch, 1 patch, Transdermal, Q24H, Hunsucker, Donnice SAUNDERS, MD, 1 patch at 12/05/24 9051   magnesium  citrate solution 1 Bottle, 1 Bottle, Oral, Once, Amin, Ankit C, MD   metoprolol  tartrate (LOPRESSOR ) tablet 50 mg, 50 mg, Oral, BID, Gretta Leita SQUIBB, DO, 50 mg at 12/05/24 9056   niMODipine  (NIMOTOP ) capsule 60 mg, 60 mg, Oral, Q4H, Lester Golas, MD, 60 mg at 12/05/24 0800   ondansetron  (ZOFRAN ) injection 4 mg, 4 mg, Intravenous, Q6H PRN, Jerri Pfeiffer, MD,  4 mg at 12/04/24 2003   oxyCODONE  (Oxy IR/ROXICODONE ) immediate release tablet 5-10 mg, 5-10 mg, Oral, Q4H PRN, Amin, Ankit C, MD, 10 mg at 12/05/24 0900   pantoprazole  (PROTONIX ) EC tablet 40 mg, 40 mg, Oral, QAC breakfast, Amin, Ankit C, MD, 40 mg at 12/05/24 0830   polyethylene glycol (MIRALAX  / GLYCOLAX ) packet 17 g, 17 g, Oral, Daily PRN, Layman Raisin, DO, 17 g at 12/03/24 1023   senna (SENOKOT) tablet 8.6 mg, 1 tablet, Oral, BID PRN, Layman Raisin, DO, 8.6 mg at 12/05/24 9495    Patients Current Diet:  Diet Order                  Diet Heart Room service appropriate? Yes with Assist; Fluid consistency: Thin  Diet effective now                         Precautions / Restrictions Precautions Precautions: Fall Precaution/Restrictions Comments: SBP<160 Restrictions Weight Bearing Restrictions Per Provider Order: No    Has the patient had 2 or more falls or a fall with injury in the past year? Yes   Prior Activity Level Community (5-7x/wk): drives, gets out of house often   Prior Functional Level Self Care: Did the patient need help bathing, dressing, using the toilet or eating? Independent   Indoor Mobility: Did the patient need assistance with walking from room to room (with or without device)? Independent   Stairs: Did the patient need assistance with internal or external stairs (with or without device)? Independent   Functional Cognition: Did the patient need help planning regular tasks such as shopping or remembering to take medications? Independent   Patient Information Are you of Hispanic, Latino/a,or Spanish origin?: A. No, not of Hispanic, Latino/a, or Spanish origin What is your race?: A. White Do you need or want an interpreter to communicate with a doctor or health care staff?: 0. No   Patient's Response To:  Health Literacy and Transportation Is the patient able to respond to health literacy and transportation needs?: Yes Health Literacy - How  often do you need to have someone help you when you read instructions, pamphlets, or other written material from your doctor or pharmacy?: Never In the past 12 months, has lack of transportation kept you from medical appointments  or from getting medications?: No In the past 12 months, has lack of transportation kept you from meetings, work, or from getting things needed for daily living?: No   Journalist, Newspaper / Equipment Home Equipment: Cane - single point, Agricultural Consultant (2 wheels), BSC/3in1   Prior Device Use: Indicate devices/aids used by the patient prior to current illness, exacerbation or injury? None of the above   Current Functional Level Cognition   Orientation Level: Oriented X4    Extremity Assessment (includes Sensation/Coordination)   Upper Extremity Assessment: Overall WFL for tasks assessed  Lower Extremity Assessment: Defer to PT evaluation     ADLs   Overall ADL's : Needs assistance/impaired Grooming: Set up, Sitting Upper Body Bathing: Set up, Sitting Lower Body Bathing: Minimal assistance, Sitting/lateral leans, Sit to/from stand Upper Body Dressing : Set up, Sitting Lower Body Dressing: Minimal assistance, Sit to/from stand Lower Body Dressing Details (indicate cue type and reason): assist for changing mesh underwear. One LOB in standingfor LB ADLs with assist to correct Toilet Transfer: Minimal assistance, Ambulation, Rolling walker (2 wheels) Functional mobility during ADLs: Minimal assistance, Rolling walker (2 wheels)     Mobility   Overal bed mobility: Needs Assistance Bed Mobility: Rolling, Sidelying to Sit, Sit to Sidelying Rolling: Supervision Sidelying to sit: Supervision Supine to sit: Supervision Sit to supine: Supervision Sit to sidelying: Supervision General bed mobility comments: using rails , no assist required     Transfers   Overall transfer level: Needs assistance Equipment used: Rolling walker (2 wheels) Transfers: Sit to/from  Stand Sit to Stand: Min assist General transfer comment: for safety, balance.     Ambulation / Gait / Stairs / Wheelchair Mobility   Ambulation/Gait Ambulation/Gait assistance: Contact guard assist, Min assist Gait Distance (Feet): 50 Feet Assistive device: Rolling walker (2 wheels) Gait Pattern/deviations: Step-through pattern, Decreased stride length, Drifts right/left General Gait Details: Ambulation today limited by pt reporting increased headache and dizziness. Pt also report light sensitivity therefore once in hallway requesting to go back. Initially CGA however up to Min A with fatigue with reciprocal gait pattern. Gait velocity: reduced Gait velocity interpretation: <1.8 ft/sec, indicate of risk for recurrent falls Stairs: Yes Stairs assistance: Contact guard assist Stair Management: Two rails, Step to pattern, Forwards Number of Stairs: 2 General stair comments: Part of DGI however pt unable to finish test due to dizziness and fatigue.     Posture / Balance Dynamic Sitting Balance Sitting balance - Comments: supervision at EOB Balance Overall balance assessment: Needs assistance Sitting-balance support: No upper extremity supported, Feet supported Sitting balance-Leahy Scale: Fair Sitting balance - Comments: supervision at EOB Standing balance support: During functional activity, Bilateral upper extremity supported Standing balance-Leahy Scale: Poor Standing balance comment: Reliant on RW Standardized Balance Assessment Standardized Balance Assessment : Dynamic Gait Index Dynamic Gait Index Level Surface: Mild Impairment Change in Gait Speed: Moderate Impairment (Clinical reason) Gait with Horizontal Head Turns: Moderate Impairment (clinical reason) Gait with Vertical Head Turns: Moderate Impairment (clinical reason) Gait and Pivot Turn: Moderate Impairment Step Over Obstacle: Moderate Impairment Step Around Obstacles: Mild Impairment Steps: Mild Impairment Total  Score: 11     Special considerations/life events  Skin Ecchymosis: arm/bilateral; Erythema/Redness: arm/bilateral and Diabetic management Lantus  14 units daily; Novolog  0-5 units daily at bedtime; Novolog  0-9 units 3x daily with meals    Previous Home Environment (from acute therapy documentation) Living Arrangements: Alone Available Help at Discharge: Family, Available 24 hours/day Type of Home: Mobile home Home Layout: One level  Home Access: Stairs to enter Entrance Stairs-Rails: Left Entrance Stairs-Number of Steps: 5 Bathroom Shower/Tub: Engineer, Manufacturing Systems: Yes How Accessible: Accessible via walker Home Care Services: No Additional Comments: staying with mom at dc as above   Discharge Living Setting Plans for Discharge Living Setting: Mobile Home (going to stay with mother) Type of Home at Discharge: Mobile home Discharge Home Layout: One level Discharge Home Access: Stairs to enter Entrance Stairs-Rails: Can reach both Entrance Stairs-Number of Steps: 4 Discharge Bathroom Shower/Tub: Walk-in shower Discharge Bathroom Toilet: Standard Discharge Bathroom Accessibility: Yes How Accessible: Accessible via walker Does the patient have any problems obtaining your medications?: No   Social/Family/Support Systems Anticipated Caregiver: Joen Pouch, mother Anticipated Caregiver's Contact Information: [phone] Caregiver Availability: 24/7 Discharge Plan Discussed with Primary Caregiver: Yes Is Caregiver In Agreement with Plan?: Yes Does Caregiver/Family have Issues with Lodging/Transportation while Pt is in Rehab?: No   Goals Patient/Family Goal for Rehab: PT/OT/SLP mod I and supervision goals Expected length of stay: 5 - 7 days Pt/Family Agrees to Admission and willing to participate: Yes Program Orientation Provided & Reviewed with Pt/Caregiver Including Roles  & Responsibilities: Yes   Decrease burden of Care through IP  rehab admission: NA   Possible need for SNF placement upon discharge: Not anticipated   Patient Condition: I have reviewed medical records from [facility], spoken with CM, and patient and family member. I met with patient at the bedside for inpatient rehabilitation assessment.  Patient will benefit from ongoing PT and OT, can actively participate in 3 hours of therapy a day 5 days of the week, and can make measurable gains during the admission.  Patient will also benefit from the coordinated team approach during an Inpatient Acute Rehabilitation admission.  The patient will receive intensive therapy as well as Rehabilitation physician, nursing, social worker, and care management interventions.  Due to safety, skin/wound care, disease management, medication administration, pain management, and patient education the patient requires 24 hour a day rehabilitation nursing.  The patient is currently min to CGA with mobility and basic ADLs.  Discharge setting and therapy post discharge at home with home health is anticipated.  Patient has agreed to participate in the Acute Inpatient Rehabilitation Program and will admit today.   Preadmission Screen Completed By:  Tinnie SHAUNNA Yvone Delayne, 12/05/2024 9:57 AM and updated by Lugene Ropes, RN on 12/05/24 at 0956 ______________________________________________________________________   Discussed status with Dr. Babs on 12/05/24 at 0930 and received approval for admission today.   Admission Coordinator:  Tinnie SHAUNNA Yvone Delayne, CCC-SLP, time 0956/Date 12/05/24    Assessment/Plan: Diagnosis: Nontraumatic SAH, likely due to hypertension Does the need for close, 24 hr/day Medical supervision in concert with the patient's rehab needs make it unreasonable for this patient to be served in a less intensive setting? Yes Co-Morbidities requiring supervision/potential complications: vasovagal syncop, headaches, diabetes, neuropatrhy, CKD stage IIIa, Rheumatoid arthritis  on immunosuppressives, anxiety/depression, constipation, and hypertension Due to bowel management, safety, disease management, medication administration, pain management, and patient education, does the patient require 24 hr/day rehab nursing? Yes Does the patient require coordinated care of a physician, rehab nurse, PT, OT, and SLP to address physical and functional deficits in the context of the above medical diagnosis(es)? Yes Addressing deficits in the following areas: balance, endurance, locomotion, strength, transferring, bowel/bladder control, bathing, dressing, feeding, grooming, toileting, cognition, speech, and language Can the patient actively participate in an intensive therapy program of at least 3 hrs of therapy 5  days a week? Yes The potential for patient to make measurable gains while on inpatient rehab is good Anticipated functional outcomes upon discharge from inpatient rehab: supervision PT, supervision OT, supervision SLP Estimated rehab length of stay to reach the above functional goals is: 5-7 days Anticipated discharge destination: Home 10. Overall Rehab/Functional Prognosis: good     MD Signature:   Joesph JAYSON Likes, DO 12/05/2024              Revision History  Date/Time User Provider Type Action  12/05/2024 10:51 AM Likes Joesph JAYSON, DO Physician Sign  12/05/2024 10:05 AM Duanne Lovett HERO, RN Rehab Admission Coordinator Share  12/05/2024  9:57 AM Duanne Lovett HERO, RN Rehab Admission Coordinator Share  12/05/2024  9:29 AM Duanne Lovett HERO, RN Rehab Admission Coordinator Share  12/05/2024  9:26 AM Duanne Lovett HERO, RN Rehab Admission Coordinator Share  12/04/2024  2:41 PM Cecilie Leita NOVAK, CCC-SLP Rehab Admission Coordinator Share  12/02/2024  3:42 PM Yvone Delayne Tinnie SHAUNNA, CCC-SLP Rehab Admission Coordinator Share   View Details Report

## 2024-12-06 DIAGNOSIS — E1169 Type 2 diabetes mellitus with other specified complication: Secondary | ICD-10-CM

## 2024-12-06 DIAGNOSIS — I609 Nontraumatic subarachnoid hemorrhage, unspecified: Secondary | ICD-10-CM | POA: Diagnosis not present

## 2024-12-06 DIAGNOSIS — E871 Hypo-osmolality and hyponatremia: Secondary | ICD-10-CM | POA: Diagnosis not present

## 2024-12-06 DIAGNOSIS — R197 Diarrhea, unspecified: Secondary | ICD-10-CM | POA: Diagnosis not present

## 2024-12-06 DIAGNOSIS — Z794 Long term (current) use of insulin: Secondary | ICD-10-CM | POA: Diagnosis not present

## 2024-12-06 DIAGNOSIS — I10 Essential (primary) hypertension: Secondary | ICD-10-CM | POA: Diagnosis not present

## 2024-12-06 DIAGNOSIS — R519 Headache, unspecified: Secondary | ICD-10-CM | POA: Diagnosis not present

## 2024-12-06 DIAGNOSIS — E876 Hypokalemia: Secondary | ICD-10-CM

## 2024-12-06 LAB — COMPREHENSIVE METABOLIC PANEL WITH GFR
ALT: 36 U/L (ref 0–44)
AST: 74 U/L — ABNORMAL HIGH (ref 15–41)
Albumin: 3.9 g/dL (ref 3.5–5.0)
Alkaline Phosphatase: 97 U/L (ref 38–126)
Anion gap: 13 (ref 5–15)
BUN: 23 mg/dL — ABNORMAL HIGH (ref 6–20)
CO2: 24 mmol/L (ref 22–32)
Calcium: 9.1 mg/dL (ref 8.9–10.3)
Chloride: 91 mmol/L — ABNORMAL LOW (ref 98–111)
Creatinine, Ser: 0.92 mg/dL (ref 0.44–1.00)
GFR, Estimated: >60 mL/min
Glucose, Bld: 106 mg/dL — ABNORMAL HIGH (ref 70–99)
Potassium: 3.3 mmol/L — ABNORMAL LOW (ref 3.5–5.1)
Sodium: 128 mmol/L — ABNORMAL LOW (ref 135–145)
Total Bilirubin: 0.4 mg/dL (ref 0.0–1.2)
Total Protein: 6.6 g/dL (ref 6.5–8.1)

## 2024-12-06 LAB — CBC WITH DIFFERENTIAL/PLATELET
Abs Immature Granulocytes: 0.04 K/uL (ref 0.00–0.07)
Basophils Absolute: 0 K/uL (ref 0.0–0.1)
Basophils Relative: 0 %
Eosinophils Absolute: 0.1 K/uL (ref 0.0–0.5)
Eosinophils Relative: 1 %
HCT: 40.6 % (ref 36.0–46.0)
Hemoglobin: 14 g/dL (ref 12.0–15.0)
Immature Granulocytes: 1 %
Lymphocytes Relative: 20 %
Lymphs Abs: 1.6 K/uL (ref 0.7–4.0)
MCH: 28.2 pg (ref 26.0–34.0)
MCHC: 34.5 g/dL (ref 30.0–36.0)
MCV: 81.7 fL (ref 80.0–100.0)
Monocytes Absolute: 1 K/uL (ref 0.1–1.0)
Monocytes Relative: 12 %
Neutro Abs: 5.6 K/uL (ref 1.7–7.7)
Neutrophils Relative %: 66 %
Platelets: 208 K/uL (ref 150–400)
RBC: 4.97 MIL/uL (ref 3.87–5.11)
RDW: 12.6 % (ref 11.5–15.5)
WBC: 8.3 K/uL (ref 4.0–10.5)
nRBC: 0 % (ref 0.0–0.2)

## 2024-12-06 LAB — GLUCOSE, CAPILLARY
Glucose-Capillary: 108 mg/dL — ABNORMAL HIGH (ref 70–99)
Glucose-Capillary: 129 mg/dL — ABNORMAL HIGH (ref 70–99)
Glucose-Capillary: 133 mg/dL — ABNORMAL HIGH (ref 70–99)
Glucose-Capillary: 87 mg/dL (ref 70–99)

## 2024-12-06 MED ORDER — POTASSIUM CHLORIDE CRYS ER 20 MEQ PO TBCR
40.0000 meq | EXTENDED_RELEASE_TABLET | Freq: Once | ORAL | Status: AC
  Administered 2024-12-06: 40 meq via ORAL
  Filled 2024-12-06: qty 2

## 2024-12-06 MED ORDER — POTASSIUM CHLORIDE CRYS ER 20 MEQ PO TBCR
20.0000 meq | EXTENDED_RELEASE_TABLET | Freq: Two times a day (BID) | ORAL | Status: DC
  Administered 2024-12-06: 20 meq via ORAL
  Filled 2024-12-06: qty 1

## 2024-12-06 MED ORDER — FLEET ENEMA RE ENEM: 1.0000 | ENEMA | Freq: Every day | RECTAL | Status: DC | PRN

## 2024-12-06 NOTE — Evaluation (Signed)
 Occupational Therapy Assessment and Plan  Patient Details  Name: Amy Adams MRN: [id number] Date of Birth: [birth year]  OT Diagnosis: lumbago (low back pain) and muscle weakness (generalized) Rehab Potential: Rehab Potential (ACUTE ONLY): Good ELOS: 7-10 mod I to SUP. May need additional time for PT   Today's Date: 12/06/2024 OT Individual Time: 0700-0818 OT Individual Time Calculation (min): 78 min     Hospital Problem: Principal Problem:   Subarachnoid hemorrhage (HCC)   Past Medical History:  Past Medical History:  Diagnosis Date   Anxiety    Arthritis    Chronic foot pain, left    GERD (gastroesophageal reflux disease)    History of anal fissures    Hyperlipidemia    Hypertension    Hypothyroidism    Insulin  dependent type 2 diabetes mellitus (HCC)    endrocrinologist-- dr tommas   Migraines    Peripheral neuropathy    RA (rheumatoid arthritis) [facility])    rheumatologist-  dr mai   SUI (stress urinary incontinence, female)    Wears glasses    Past Surgical History:  Past Surgical History:  Procedure Laterality Date   ABDOMINAL HYSTERECTOMY  1990   with Right Salpingo--ophorectomy   ANAL SPHINCTEROTOMY  01-05-2006    dr lorriane @[facility]    BUNIONECTOMY  09-25-2009   dr verta @[facility]    right great toe   CARPAL TUNNEL RELEASE Bilateral 1996;  1997   CESAREAN SECTION  1988   FOOT SURGERY  12/ 2014   dr verta   left 2nd hammertoe repair,  left 2nd metatarsal matrixectomy, left heel endoscopic plantar fasiotomy   IR CATHETER TUBE CHANGE  01/01/2019   IR RADIOLOGIST EVAL & MGMT  11/28/2018   IR RADIOLOGIST EVAL & MGMT  11/29/2018   IR RADIOLOGIST EVAL & MGMT  12/06/2018   IR RADIOLOGIST EVAL & MGMT  12/25/2018   IR RADIOLOGIST EVAL & MGMT  01/08/2019   IR RADIOLOGIST EVAL & MGMT  01/29/2019   PANNICULECTOMY N/A 10/26/2018   Procedure: PANNICULECTOMY;  Surgeon: Gladis Cough, MD;  Location: [facility];  Service: General;  Laterality: N/A;   TENDON REPAIR  09/2015   left  posterior tibial and peroneal tendon repairs   TOE AMPUTATION  01/2015   right 2nd toe   TONSILLECTOMY  age 57   TRANSOBTURATOR SLING  01-08-2004   dr watt @[facility]     Assessment & Plan Clinical Impression: Patient is a 59 y.o. right-handed female with past medical history significant for anxiety, class I obesity with BMI 32.72, hypertension, hyperlipidemia, diabetes mellitus with peripheral neuropathy followed by Dr.Balan, chronic low back pain, rheumatoid arthritis followed by Dr. Mai maintained on Plaquenil  as well as Arava , RLS, CKD stage III with baseline 1.1, migraine headaches, anal fissures status post anal sphincterotomy 2007 and right eye laser surgery 6 weeks ago for glaucoma. Per chart review patient lives alone. Mobile home with 4 steps to entry. Reportedly independent driving prior to admission. Family can provide assistance as needed. Presented 11/27/2024 to [facility] with headache and pain behind her right eye since recent laser surgery as well as blurred vision. She developed some neck spasms that triggered upper extremity and facial numbness with left facial droop and nausea/vomiting. 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 CTA negative for large vessel occlusion or hemodynamically significant stenosis. Admission chemistries unremarkable except potassium 3.4, CO2 20, glucose 219, creatinine 1.12, hemoglobin A1c 6.7. BP noted to be elevated with systolics  in the 180-190s. She was given a single dose of labetalol  and started on Cleviprex  infusion and transitioned to Nimotop  and latest cranial CT scan 1/19 showing some concerns for vasospasm of the basilar artery. Echocardiogram with ejection fraction greater than 75% no regional wall motion abnormalities grade 1 diastolic dysfunction. She was transferred to [facility] admitted to the ICU. Neurosurgery Dr. Nancyann Burns consulted and advised  conservative care. Initially placed on Keppra  for seizure prophylaxis completing course. Patient was cleared to begin Lovenox  for DVT prophylaxis 11/29/2024. Tolerating a regular consistency diet. Therapy evaluations completed and due to patient's decreased functional mobility was admitted for a comprehensive rehab program.   Patient currently requires min with basic self-care skills secondary to muscle weakness, decreased cardiorespiratoy endurance, and decreased standing balance, decreased postural control, and decreased balance strategies.  Prior to hospitalization, patient could complete ADLS with modified independent .  Patient will benefit from skilled intervention to decrease level of assist with basic self-care skills and increase independence with basic self-care skills prior to discharge home with care partner.  Anticipate patient will require intermittent supervision and follow up home health.  OT - End of Session Activity Tolerance: Tolerates 30+ min activity with multiple rests OT Assessment Rehab Potential (ACUTE ONLY): Good OT Barriers to Discharge: Lack of/limited family support;Decreased caregiver support OT Barriers to Discharge Comments: headache, activity tolerance OT Patient demonstrates impairments in the following area(s): Behavior;Motor;Perception OT Basic ADL's Functional Problem(s): Bathing;Dressing;Toileting OT Advanced ADL's Functional Problem(s): Light Housekeeping OT Transfers Functional Problem(s): Toilet;Tub/Shower OT Additional Impairment(s): None OT Plan OT Intensity: Minimum of 1-2 x/day, 45 to 90 minutes OT Frequency: 5 out of 7 days OT Duration/Estimated Length of Stay: 5-7 sup to mod I OT Treatment/Interventions: Balance/vestibular training;Disease mangement/prevention;Neuromuscular re-education;Self Care/advanced ADL retraining;Therapeutic Exercise;UE/LE Strength taining/ROM;Skin care/wound managment;Pain management;DME/adaptive equipment  instruction;Cognitive remediation/compensation;Community reintegration;Patient/family education;UE/LE Coordination activities;Discharge planning;Functional mobility training;Therapeutic Activities;Visual/perceptual remediation/compensation OT Self Feeding Anticipated Outcome(s): mod I OT Basic Self-Care Anticipated Outcome(s): mod I to SUP OT Toileting Anticipated Outcome(s): mod I OT Bathroom Transfers Anticipated Outcome(s): SUP OT Recommendation Recommendations for Other Services: None Patient destination: Home Follow Up Recommendations: 24 hour supervision/assistance Equipment Recommended: 3 in 1 bedside comode;Tub/shower seat   OT Evaluation Precautions/Restrictions  Precautions Precautions: Fall Recall of Precautions/Restrictions: Impaired Restrictions Weight Bearing Restrictions Per Provider Order: No General Chart Reviewed: Yes PT Missed Treatment Reason: Not applicable Response to Previous Treatment: Not applicable Vital Signs   Pain Pain Assessment Pain Scale: 0-10 Pain Score: 7  Pain Location: Head Pain Intervention(s): Medication (See eMAR) Home Living/Prior Functioning Home Living Family/patient expects to be discharged to:: Private residence Living Arrangements: Parent Available Help at Discharge: Family, Available 24 hours/day Type of Home: Mobile home Home Access: Stairs to enter Secretary/administrator of Steps: 5 Entrance Stairs-Rails: Left Home Layout: One level Bathroom Shower/Tub: Health Visitor: Standard Bathroom Accessibility: Yes Additional Comments: staying with mom at dc as above  Lives With: Alone IADL History Homemaking Responsibilities: Yes Meal Prep Responsibility: Primary Laundry Responsibility: Primary Cleaning Responsibility: Primary Bill Paying/Finance Responsibility: Primary Shopping Responsibility: Primary Child Care Responsibility: Primary Current License: Yes Mode of Transportation: Car Occupation: Other  (comment) (has not returned to work since eye surgery) Prior Function Level of Independence: Independent with basic ADLs, Independent with homemaking with ambulation  Able to Take Stairs?: Yes Driving: Yes Vocation: Other (Comment) (has not returned to work since eye surgery) Vision Baseline Vision/History: 1 Wears glasses Ability to See in Adequate Light: 1 Impaired Patient Visual Report: Blurring of  vision;Eye fatigue/eye pain/headache Vision Assessment?: Wears glasses for reading;Yes Eye Alignment: Within Functional Limits Ocular Range of Motion: Within Functional Limits Diplopia Assessment: Present in far gaze;Present in near gaze;Objects split side to side Perception  Perception: Within Functional Limits Praxis Praxis: WFL Cognition Cognition Overall Cognitive Status: Within Functional Limits for tasks assessed Arousal/Alertness: Awake/alert Memory: Appears intact Attention: Sustained;Selective Sustained Attention: Appears intact Selective Attention: Appears intact Awareness: Appears intact Problem Solving: Appears intact Safety/Judgment: Appears intact Brief Interview for Mental Status (BIMS) Repetition of Three Words (First Attempt): 3 Temporal Orientation: Year: Correct Temporal Orientation: Month: Accurate within 5 days Temporal Orientation: Day: Incorrect Recall: Sock: Yes, no cue required Recall: Blue: Yes, after cueing (a color) Recall: Bed: Yes, no cue required BIMS Summary Score: 13 Sensation Sensation Light Touch: Appears Intact Coordination Gross Motor Movements are Fluid and Coordinated: No Fine Motor Movements are Fluid and Coordinated: Yes Finger Nose Finger Test: [facility] Motor  Motor Motor: Within Functional Limits  Trunk/Postural Assessment  Cervical Assessment Cervical Assessment: Exceptions to [facility] (forward head) Thoracic Assessment Thoracic Assessment: Exceptions to [facility] (rounded shoulders) Lumbar Assessment Lumbar Assessment: Within  Functional Limits (LBP chronic) Postural Control Postural Control: Within Functional Limits  Balance Balance Balance Assessed: Yes Dynamic Sitting Balance Dynamic Sitting - Balance Support: During functional activity Dynamic Sitting - Level of Assistance: 5: Stand by assistance Dynamic Sitting - Balance Activities: Lateral lean/weight shifting Sitting balance - Comments: supervision at EOB Static Standing Balance Static Standing - Balance Support: Right upper extremity supported;Left upper extremity supported;During functional activity Static Standing - Level of Assistance: 5: Stand by assistance Dynamic Standing Balance Dynamic Standing - Balance Support: Right upper extremity supported;Left upper extremity supported;During functional activity Dynamic Standing - Level of Assistance: 5: Stand by assistance;4: Min assist Extremity/Trunk Assessment RUE Assessment RUE Assessment: Within Functional Limits LUE Assessment LUE Assessment: Within Functional Limits  Care Tool Care Tool Self Care Eating   Eating Assist Level: Independent with assistive device    Oral Care    Oral Care Assist Level: Set up assist    Bathing   Body parts bathed by patient: Right arm;Left arm;Left upper leg;Right lower leg;Chest;Abdomen;Front perineal area;Buttocks;Right upper leg;Left lower leg;Face     Assist Level: Minimal Assistance - Patient > 75%    Upper Body Dressing(including orthotics)   What is the patient wearing?: Pull over shirt   Assist Level: Supervision/Verbal cueing    Lower Body Dressing (excluding footwear)   What is the patient wearing?: Underwear/pull up;Pants Assist for lower body dressing: Contact Guard/Touching assist    Putting on/Taking off footwear   What is the patient wearing?: Socks Assist for footwear: Supervision/Verbal cueing       Care Tool Toileting Toileting activity   Assist for toileting: Contact Guard/Touching assist     Care Tool Bed Mobility Roll  left and right activity   Roll left and right assist level: Supervision/Verbal cueing    Sit to lying activity   Sit to lying assist level: Supervision/Verbal cueing    Lying to sitting on side of bed activity   Lying to sitting on side of bed assist level: the ability to move from lying on the back to sitting on the side of the bed with no back support.: Supervision/Verbal cueing     Care Tool Transfers Sit to stand transfer   Sit to stand assist level: Contact Guard/Touching assist    Chair/bed transfer   Chair/bed transfer assist level: Minimal Assistance - Patient > 75%  Toilet transfer   Assist Level: Minimal Assistance - Patient > 75%     Care Tool Cognition  Expression of Ideas and Wants Expression of Ideas and Wants: 4. Without difficulty (complex and basic) - expresses complex messages without difficulty and with speech that is clear and easy to understand  Understanding Verbal and Non-Verbal Content Understanding Verbal and Non-Verbal Content: 4. Understands (complex and basic) - clear comprehension without cues or repetitions   Memory/Recall Ability Memory/Recall Ability : Current season;Location of own room;Staff names and faces;That he or she is in a hospital/hospital unit   Refer to Care Plan for Long Term Goals  SHORT TERM GOAL WEEK 1 OT Short Term Goal 1 (Week 1): STG+LTG d/t ELOS  Recommendations for other services: None    Skilled Therapeutic Intervention Evaluation completed (see details above) with education on OT POC and goals and individual treatment initiated with focus on functional mobility/transfers, ADL re-training,  generalized strengthening and endurance, dynamic standing balance/coordination, pt/family education and discharge planning. Pt education provided on therapy schedule and safety policy with use of chair alarm. Pt participated in goal setting. Pt participated in modified ADL task (See above for functional performance). Patient had  significant complaints of headache, and stomach being upset/ loose bowel during session. Patient required encouragement to get OOB. Required SUP to get to EOB, CGA to min for all transfers. LB dressing and bathing reported to increase pain in head. Patient then returned to bed following session all needs in reach alarm on.   ADL ADL Equipment Provided: Reacher Eating: Modified independent Where Assessed-Eating: Bed level Grooming: Supervision/safety;Modified independent Where Assessed-Grooming: Sitting at sink Upper Body Bathing: Supervision/safety Where Assessed-Upper Body Bathing: Sitting at sink Lower Body Bathing: Contact guard Where Assessed-Lower Body Bathing: Sitting at sink Upper Body Dressing: Modified independent (Device) Where Assessed-Upper Body Dressing: Sitting at sink Lower Body Dressing: Minimal assistance Where Assessed-Lower Body Dressing: Sitting at sink Toileting: Contact guard Where Assessed-Toileting: Teacher, Adult Education: Minimal assistance;Contact Pension Scheme Manager Method: Proofreader: Acupuncturist: Administrator, Arts Method: Warden/ranger: Shower seat with back Mobility  Bed Mobility Bed Mobility: Rolling Right;Rolling Left;Supine to Sit;Sit to Supine Rolling Right: Supervision/verbal cueing Rolling Left: Supervision/Verbal cueing Supine to Sit: Supervision/Verbal cueing Sit to Supine: Supervision/Verbal cueing Transfers Sit to Stand: Contact Guard/Touching assist Stand to Sit: Contact Guard/Touching assist   Discharge Criteria: Patient will be discharged from OT if patient refuses treatment 3 consecutive times without medical reason, if treatment goals not met, if there is a change in medical status, if patient makes no progress towards goals or if patient is discharged from hospital.  The above assessment, treatment plan, treatment alternatives and goals were  discussed and mutually agreed upon: by patient  D'mariea L Trinda Harlacher 12/06/2024, 10:30 AM

## 2024-12-06 NOTE — Progress Notes (Signed)
 Inpatient Rehabilitation  Patient information reviewed and entered into eRehab system by [facility]. Karen Kays., CCC/SLP, PPS Coordinator.  Information including medical coding, functional ability and quality indicators will be reviewed and updated through discharge.

## 2024-12-06 NOTE — Progress Notes (Addendum)
 Inpatient Rehabilitation Admission Medication Review by a Pharmacist  A complete drug regimen review was completed for this patient to identify any potential clinically significant medication issues.  High Risk Drug Classes Is patient taking? Indication by Medication  Antipsychotic No   Anticoagulant Yes Lovenox  - DVT prophylaxis  Antibiotic No   Opioid Yes Oxycodone  - pain  Antiplatelet No   Hypoglycemics/insulin  Yes Insulin  aspart, Insulin  Lantus - DM  Vasoactive Medication Yes Amlodipine , Clonidine , Irbesartan , metoprolol . Hydrochlorothiazide  - HTN Nimodipine  - SAH  Chemotherapy No   Other Yes Senna/Miralax  - constipation Tylenol , Lidoderm  and gabapentin  - pain Xanax  - anxiety Tessalon  - Cough Flexeril  - muscle spasms Cymbalta  - MDD Fenofibrate  - HLD Pantoprazole  - GERD Synthroid  - hyperthyroidism Arava  and plaquenil  - rheumatoid arthritis Fiorecet - HA     Type of Medication Issue Identified Description of Issue Recommendation(s)  Drug Interaction(s) (clinically significant)     Duplicate Therapy     Allergy      No Medication Administration End Date     Incorrect Dose     Additional Drug Therapy Needed     Significant med changes from prior encounter (inform family/care partners about these prior to discharge). Metoprolol  - Dose INCREASE  Chlorthalidone  Held  Valsartan  changed to avapro  per formulary   Metformin  - Held  Singulair  - Held  Ambien  - Held Metoprolol  - communicate with family upon d/c Chlorthalidone  held-communicate with family upon d/c Valsartan  changed to formulary agent-change back to valsartan  at discharge Metformin  - if not restarted - communicate with family upon d/c Singulair  - if not restarted - communicate with family upon d/c Ambien  - if not restarted - communicate with family upon d/c  Other       Clinically significant medication issues were identified that warrant physician communication and completion of prescribed/recommended  actions by midnight of the next day:  No  Name of provider notified for urgent issues identified:   Provider Method of Notification:     Pharmacist comments:   Time spent performing this drug regimen review (minutes):  25   Ionia Schey A. Lyle, PharmD, BCPS, FNKF Clinical Pharmacist [place] Please utilize Amion for appropriate phone number to reach the unit pharmacist [facility])

## 2024-12-06 NOTE — Progress Notes (Addendum)
 " Inpatient Rehabilitation Care Coordinator Assessment and Plan Patient Details  Name: Amy Adams MRN: [id number] Date of Birth: [birth year]  Today's Date: 12/06/2024  Hospital Problems: Principal Problem:   Subarachnoid hemorrhage [facility])  Past Medical History:  Past Medical History:  Diagnosis Date   Anxiety    Arthritis    Chronic foot pain, left    GERD (gastroesophageal reflux disease)    History of anal fissures    Hyperlipidemia    Hypertension    Hypothyroidism    Insulin  dependent type 2 diabetes mellitus (HCC)    endrocrinologist-- dr tommas   Migraines    Peripheral neuropathy    RA (rheumatoid arthritis) [facility])    rheumatologist-  dr mai   SUI (stress urinary incontinence, female)    Wears glasses    Past Surgical History:  Past Surgical History:  Procedure Laterality Date   ABDOMINAL HYSTERECTOMY  1990   with Right Salpingo--ophorectomy   ANAL SPHINCTEROTOMY  01-05-2006    dr lorriane @[facility]    BUNIONECTOMY  09-25-2009   dr verta @[facility]    right great toe   CARPAL TUNNEL RELEASE Bilateral 1996;  1997   CESAREAN SECTION  1988   FOOT SURGERY  12/ 2014   dr verta   left 2nd hammertoe repair,  left 2nd metatarsal matrixectomy, left heel endoscopic plantar fasiotomy   IR CATHETER TUBE CHANGE  01/01/2019   IR RADIOLOGIST EVAL & MGMT  11/28/2018   IR RADIOLOGIST EVAL & MGMT  11/29/2018   IR RADIOLOGIST EVAL & MGMT  12/06/2018   IR RADIOLOGIST EVAL & MGMT  12/25/2018   IR RADIOLOGIST EVAL & MGMT  01/08/2019   IR RADIOLOGIST EVAL & MGMT  01/29/2019   PANNICULECTOMY N/A 10/26/2018   Procedure: PANNICULECTOMY;  Surgeon: Gladis Cough, MD;  Location: [facility];  Service: General;  Laterality: N/A;   TENDON REPAIR  09/2015   left posterior tibial and peroneal tendon repairs   TOE AMPUTATION  01/2015   right 2nd toe   TONSILLECTOMY  age 73   TRANSOBTURATOR SLING  01-08-2004   dr watt @[facility]    Social History:  reports that she has never smoked. She has never used  smokeless tobacco. She reports that she does not drink alcohol  and does not use drugs.  Family / Support Systems Marital Status: Widow/Widower How Long?: 9 years Patient Roles: Parent, Other (Comment) Children: Amy Adams Other Supports: Mother Anticipated Caregiver: Mother Ability/Limitations of Caregiver: None mentioned Caregiver Availability: 24/7 Family Dynamics: Good family support  Social History Preferred language: English Religion: Non-Denominational Education: Charity Fundraiser - How often do you need to have someone help you when you read instructions, pamphlets, or other written material from your doctor or pharmacy?: Never Writes: Yes Employment Status: Unemployed Date Retired/Disabled/Unemployed: 2024 Legal History/Current Legal Issues: N/a Guardian/Conservator: N/a   Abuse/Neglect Abuse/Neglect Assessment Can Be Completed: Yes Physical Abuse: Denies Verbal Abuse: Denies Sexual Abuse: Denies Exploitation of patient/patient's resources: Denies Self-Neglect: Denies  Patient response to: Social Isolation - How often do you feel lonely or isolated from those around you?: Never  Emotional Status Pt's affect, behavior and adjustment status: Adjusting to therapy Recent Psychosocial Issues: None Psychiatric History: None Substance Abuse History: None  Patient / Family Perceptions, Expectations & Goals Pt/Family understanding of illness & functional limitations: Patient/family understandng of illness & functional limitations Premorbid pt/family roles/activities: Limited activity in the community after back surgery Anticipated changes in roles/activities/participation: None anticipated Pt/family expectations/goals: Environmental consultant  [employer]: None Premorbid  Home Care/DME Agencies: None Transportation available at discharge: Yes Is the patient able to respond to transportation needs?: Yes In the past 12 months,  has lack of transportation kept you from medical appointments or from getting medications?: No In the past 12 months, has lack of transportation kept you from meetings, work, or from getting things needed for daily living?: No  Discharge Planning Living Arrangements: Parent Support Systems: Parent, Children, Other relatives Type of Residence: Private residence Insurance Resources: Media Planner (specify) ([employer] / C[employer] [place] HMO CONNECT) Surveyor, Quantity Resources: Family Support Financial Screen Referred: Yes Living Expenses: Lives with family Money Management: Patient Does the patient have any problems obtaining your medications?: No Home Management: Lives with mother Patient/Family Preliminary Plans: Plans to discharge home with mother providing care and support as needed with support from other family. Care Coordinator Anticipated Follow Up Needs: HH/OP Expected length of stay: 5-7 days  Clinical Impression CSW met with patient/family to introduce herself and complete initial assessment. Patient presented to [facility] following Subarachnoid hemorrhage. Patient is able to make needs known. She reports that she has a headache and her eye hurts - her nurse provided her meds to manage pain. Patient's mother Amy Adams and sister were both present during assessment. Amy Adams was able to confirm that Amy Adams will discharge home with her and that she will be able to support her.  There were no further needs or concerns at present. CSW will follow up with family and continue to follow. Will provide patient/family with an update as soon as one becomes available.   Amy Adams  Amy Adams 12/06/2024, 2:06 PM    "

## 2024-12-06 NOTE — Progress Notes (Signed)
 "                                                        PROGRESS NOTE   Subjective/Complaints: Continues to have headache, one of the blinds is not closing very well and the light is bothering her eyes.  Reports some loose bowel movements.  ROS: Patient denies fever, new vision changes, dizziness, vomiting,   shortness of breath or chest pain, or mood change. + diarrhea + headache   Objective:   DG Abd 1 View Result Date: 12/05/2024 EXAM: 2 AP PORTABLE SUPINE VIEWS XRAY OF THE ABDOMEN 12/05/2024 08:50:00 AM COMPARISON: CT abdomen and pelvis 01/08/2019. CLINICAL HISTORY: 59 year old female with abdominal distention, constipation, and subarachnoid hemorrhage earlier this month. FINDINGS: BOWEL: Nonobstructed bowel gas pattern. Average volume of retained stool. SOFT TISSUES: Incidental calcified pelvic phleboliths. BONES: Lower lumbar fusion hardware is new from the previous CT. Bulky degenerative flowing endplate osteophytes in the lower thoracic spine. No acute fracture. IMPRESSION: 1. Non-obstructed bowel gas pattern with average volume retained stool volume. Electronically signed by: Helayne Hurst MD 12/05/2024 08:54 AM EST RP Workstation: [id number]   Recent Labs    12/05/24 1952 12/06/24 0644  WBC 9.5 8.3  HGB 14.8 14.0  HCT 43.2 40.6  PLT 222 208   Recent Labs    12/05/24 1952 12/06/24 0644  NA  --  128*  K  --  3.3*  CL  --  91*  CO2  --  24  GLUCOSE  --  106*  BUN  --  23*  CREATININE 0.95 0.92  CALCIUM   --  9.1   No intake or output data in the 24 hours ending 12/06/24 1148      Physical Exam: Vital Signs Blood pressure (!) 114/53, pulse 66, temperature 98.5 F (36.9 C), temperature source Oral, resp. rate 17, height 5' 8 (1.727 m), weight 99.9 kg, SpO2 95%.  Constitutional: No apparent distress.  Laying in bed  Eyes: PERRLA. EOMI. Visual fields grossly intact. + Pain in left eye with photophobia, EOMI, or exertion Wearing dark sunglasses   Cardiovascular:  RRR, no murmurs/rub/gallops. No Edema Respiratory: CTAB.  Nonlabored breathing Abdomen: + bowel sounds, normoactive. No distention or tenderness.  Skin: C/D/I. No apparent lesions.    MSK:      No apparent deformity.       Neuro: Alert and oriented x 4, sensation intact to light touch in all 4 extremities, left facial droop, no hypertonia noted.  Moving all 4 extremities to gravity and resistance.  Prior exam: Neurologic exam:  Cognition: AAO to person, place, time and event.   + Cognitive/memory deficits, complicated by persistent pain Language: Fluent, No substitutions or neoglisms.  Mild dysarthria Memory: Difficult to assess due to significant discomfort Insight: Fair insight into current condition.  Mood: Pleasant affect, appropriate mood.  Sensation: Reduced to light touch throughout both sides of her face, not on limbs. Reflexes: 2+ in BL UE and LEs. Negative Hoffman's and babinski signs bilaterally.   CN: Left facial droop, left vision difficulty, bilateral facial numbness/tingling Coordination: No apparent tremors or ataxia, complicated by vision/pain Spasticity: MAS 0 in all extremities.   Strength: Moving all 4 limbs antigravity against resistance, 5- out of 5 throughout.  Testing limited due to severe, worsening headaches with exertion.  Assessment/Plan: 1. Functional  deficits which require 3+ hours per day of interdisciplinary therapy in a comprehensive inpatient rehab setting. Physiatrist is providing close team supervision and 24 hour management of active medical problems listed below. Physiatrist and rehab team continue to assess barriers to discharge/monitor patient progress toward functional and medical goals  Care Tool:  Bathing    Body parts bathed by patient: Right arm, Left arm, Left upper leg, Right lower leg, Chest, Abdomen, Front perineal area, Buttocks, Right upper leg, Left lower leg, Face         Bathing assist Assist Level: Minimal Assistance -  Patient > 75%     Upper Body Dressing/Undressing Upper body dressing   What is the patient wearing?: Pull over shirt    Upper body assist Assist Level: Supervision/Verbal cueing    Lower Body Dressing/Undressing Lower body dressing      What is the patient wearing?: Underwear/pull up, Pants     Lower body assist Assist for lower body dressing: Contact Guard/Touching assist     Toileting Toileting    Toileting assist Assist for toileting: Contact Guard/Touching assist     Transfers Chair/bed transfer  Transfers assist     Chair/bed transfer assist level: Minimal Assistance - Patient > 75%     Locomotion Ambulation   Ambulation assist              Walk 10 feet activity   Assist           Walk 50 feet activity   Assist           Walk 150 feet activity   Assist           Walk 10 feet on uneven surface  activity   Assist           Wheelchair     Assist               Wheelchair 50 feet with 2 turns activity    Assist            Wheelchair 150 feet activity     Assist          Blood pressure (!) 114/53, pulse 66, temperature 98.5 F (36.9 C), temperature source Oral, resp. rate 17, height 5' 8 (1.727 m), weight 99.9 kg, SpO2 95%.  Medical Problem List and Plan: 1. 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  Antithrombotics: -DVT/anticoagulation:  Pharmaceutical: Lovenox  initiated 11/29/2024             -antiplatelet therapy: N/A 3. Pain Management/migraine headaches: Lidocaine  patch, Neurontin  400 mg 3 times daily,, Fioricet  as needed, Flexeril  5 mg 3 times daily as needed, oxycodone  5 to 10 mg every 4 hours as needed severe pain 4. Mood/Behavior/Sleep: Cymbalta  60 mg twice daily, Xanax  1 mg 3  times daily             -antipsychotic agents: N/A 5. Neuropsych/cognition: This patient is capable of making decisions on her own behalf. 6. Skin/Wound Care: Routine skin checks 7. Fluids/Electrolytes/Nutrition: Routine ins and outs with follow-up chemistries 8.  Hypertension.  Norvasc  10 mg daily, clonidine  0.2 mg 3 times daily, HCTZ 25 mg daily, Avapro  300  mg daily, Lopressor  50 mg twice daily.  Monitor with increased mobility  - 1/23 Stop hydrochlorothiazide  due to hyponatremia    12/06/2024    5:15 AM 12/05/2024    8:02 PM 12/05/2024    4:15 PM  Vitals with BMI  Height   5' 8  Weight   220 lbs 4 oz  BMI   33.5  Systolic 114 155 832  Diastolic 53 75 67  Pulse 66 72 67    9.  Diabetes mellitus.  Hemoglobin A1c 6.7.  Currently SSI/Lantus  insulin  14 units daily.  Prior to admission patient on Humalog  10-15 units twice daily, Mounjaro  15 mg weekly, Glucophage  1000 mg twice daily.  Patient is followed by endocrinology services Dr. Tommas  - 1/23 controlled continue current regimen CBG (last 3)  Recent Labs    12/05/24 2021 12/06/24 0558 12/06/24 1115  GLUCAP 134* 108* 129*    10.  CKD stage III.  Creatinine baseline 1.1.  Follow-up chemistries  - 1/23 BUN a little higher at 23, creatinine down to 0.92.  Will check PVRs just to make sure she is not retaining 11.  Rheumatoid arthritis.  Chronic immunosuppressants.  Continue Plaquenil  as well as Arava .  Follow-up rheumatology services 12.  Glaucoma.  Recent right eye laser surgery 6 weeks ago.  Follow-up outpatient 13.  Hypothyroidism.  Synthroid  14.  Class I obesity.  BMI 32.72.  Dietary follow-up 15.  GERD.  Protonix  16.  Hyperlipidemia.  Fenofibrate . 17.  History of anal fissures.  Status post anal sphincterotomy 2007.  Follow-up outpatient 18.  Hyponatremia   -1/23 Na 128 this Am, DC hydrochlorothiazide , will recheck tomorrow.  Will do fluid restriction.  Discussed with nephrology Dr. Tobie appreciate assistance 19.  Hypokalemia  -1/23 20 mEq twice daily ordered, she got the first dose but after speaking with nephrology will increase second dose to 40 mEq because she may be total body depleted 20.  Diarrhea  - Will avoid scheduled laxatives.  Continue to monitor  LOS: 1 days A FACE TO FACE EVALUATION WAS PERFORMED  Murray Collier 12/06/2024, 11:48 AM     "

## 2024-12-06 NOTE — Plan of Care (Signed)
" °  Problem: Consults Goal: RH STROKE PATIENT EDUCATION Description: See Patient Education module for education specifics  Outcome: Progressing Goal: Nutrition Consult-if indicated Outcome: Progressing Goal: Diabetes Guidelines if Diabetic/Glucose > 140 Description: If diabetic or lab glucose is > 140 mg/dl - Initiate Diabetes/Hyperglycemia Guidelines & Document Interventions  Outcome: Progressing   Problem: RH BOWEL ELIMINATION Goal: RH STG MANAGE BOWEL WITH ASSISTANCE Description: STG Manage Bowel with mod I  Assistance. Outcome: Progressing Goal: RH STG MANAGE BOWEL W/MEDICATION W/ASSISTANCE Description: STG Manage Bowel with Medication with mod I Assistance. Outcome: Progressing   Problem: RH SAFETY Goal: RH STG ADHERE TO SAFETY PRECAUTIONS W/ASSISTANCE/DEVICE Description: STG Adhere to Safety Precautions With cues Assistance/Device. Outcome: Progressing   Problem: RH KNOWLEDGE DEFICIT Goal: RH STG INCREASE KNOWLEDGE OF DIABETES Description: Patient will be able to manage DM using medications and dietary modification independently Outcome: Progressing Goal: RH STG INCREASE KNOWLEDGE OF HYPERTENSION Description: Patient will be able to manage HLD using medications and dietary modification independently Outcome: Progressing Goal: RH STG INCREASE KNOWLEGDE OF HYPERLIPIDEMIA Description: Patient will be able to manage HLD using medications and dietary modification independently Outcome: Progressing Goal: RH STG INCREASE KNOWLEDGE OF STROKE PROPHYLAXIS Description: Patient will be able to manage secondary risks using medications and dietary modification independently Outcome: Progressing   "

## 2024-12-06 NOTE — Plan of Care (Signed)
" °  Problem: RH Balance Goal: LTG: Patient will maintain dynamic sitting balance (OT) Description: LTG:  Patient will maintain dynamic sitting balance with assistance during activities of daily living (OT) Flowsheets (Taken 12/06/2024 1545) LTG: Pt will maintain dynamic sitting balance during ADLs with: Independent with assistive device Goal: LTG Patient will maintain dynamic standing with ADLs (OT) Description: LTG:  Patient will maintain dynamic standing balance with assist during activities of daily living (OT)  Flowsheets (Taken 12/06/2024 1545) LTG: Pt will maintain dynamic standing balance during ADLs with: Supervision/Verbal cueing   Problem: Sit to Stand Goal: LTG:  Patient will perform sit to stand in prep for activites of daily living with assistance level (OT) Description: LTG:  Patient will perform sit to stand in prep for activites of daily living with assistance level (OT) Flowsheets (Taken 12/06/2024 1545) LTG: PT will perform sit to stand in prep for activites of daily living with assistance level: Independent with assistive device   Problem: RH Bathing Goal: LTG Patient will bathe all body parts with assist levels (OT) Description: LTG: Patient will bathe all body parts with assist levels (OT) Flowsheets (Taken 12/06/2024 1545) LTG: Pt will perform bathing with assistance level/cueing: Supervision/Verbal cueing   Problem: RH Dressing Goal: LTG Patient will perform upper body dressing (OT) Description: LTG Patient will perform upper body dressing with assist, with/without cues (OT). Flowsheets (Taken 12/06/2024 1545) LTG: Pt will perform upper body dressing with assistance level of: Independent with assistive device Goal: LTG Patient will perform lower body dressing w/assist (OT) Description: LTG: Patient will perform lower body dressing with assist, with/without cues in positioning using equipment (OT) Flowsheets (Taken 12/06/2024 1545) LTG: Pt will perform lower body dressing  with assistance level of: Independent with assistive device   Problem: RH Toileting Goal: LTG Patient will perform toileting task (3/3 steps) with assistance level (OT) Description: LTG: Patient will perform toileting task (3/3 steps) with assistance level (OT)  Flowsheets (Taken 12/06/2024 1545) LTG: Pt will perform toileting task (3/3 steps) with assistance level: Supervision/Verbal cueing   Problem: RH Light Housekeeping Goal: LTG Patient will perform light housekeeping w/assist (OT) Description: LTG: Patient will perform light housekeeping with assistance, with/without cues (OT). Flowsheets (Taken 12/06/2024 1545) LTG: Pt will perform light housekeeping with assistance level of: Supervision/Verbal cueing   Problem: RH Toilet Transfers Goal: LTG Patient will perform toilet transfers w/assist (OT) Description: LTG: Patient will perform toilet transfers with assist, with/without cues using equipment (OT) Flowsheets (Taken 12/06/2024 1545) LTG: Pt will perform toilet transfers with assistance level of: Supervision/Verbal cueing   Problem: RH Tub/Shower Transfers Goal: LTG Patient will perform tub/shower transfers w/assist (OT) Description: LTG: Patient will perform tub/shower transfers with assist, with/without cues using equipment (OT) Flowsheets (Taken 12/06/2024 1545) LTG: Pt will perform tub/shower stall transfers with assistance level of: Supervision/Verbal cueing   "

## 2024-12-06 NOTE — Evaluation (Signed)
 Speech Language Pathology Assessment and Plan  Patient Details  Name: Amy Adams MRN: [id number] Date of Birth: [birth year]  SLP Diagnosis:   n/a Rehab Potential:  n/a ELOS:   n/a   Today's Date: 12/06/2024 SLP Individual Time: 0900-0930 SLP Individual Time Calculation (min): 30 min and Today's Date: 12/06/2024 SLP Missed Time: 30 Minutes (no speech needs indicated) Missed Time Reason:  No speech needs indicated   Hospital Problem: Principal Problem:   Subarachnoid hemorrhage (HCC)  Past Medical History:  Past Medical History:  Diagnosis Date   Anxiety    Arthritis    Chronic foot pain, left    GERD (gastroesophageal reflux disease)    History of anal fissures    Hyperlipidemia    Hypertension    Hypothyroidism    Insulin  dependent type 2 diabetes mellitus (HCC)    endrocrinologist-- dr tommas   Migraines    Peripheral neuropathy    RA (rheumatoid arthritis) [facility])    rheumatologist-  dr mai   SUI (stress urinary incontinence, female)    Wears glasses    Past Surgical History:  Past Surgical History:  Procedure Laterality Date   ABDOMINAL HYSTERECTOMY  1990   with Right Salpingo--ophorectomy   ANAL SPHINCTEROTOMY  01-05-2006    dr lorriane @[facility]    BUNIONECTOMY  09-25-2009   dr verta @[facility]    right great toe   CARPAL TUNNEL RELEASE Bilateral 1996;  1997   CESAREAN SECTION  1988   FOOT SURGERY  12/ 2014   dr verta   left 2nd hammertoe repair,  left 2nd metatarsal matrixectomy, left heel endoscopic plantar fasiotomy   IR CATHETER TUBE CHANGE  01/01/2019   IR RADIOLOGIST EVAL & MGMT  11/28/2018   IR RADIOLOGIST EVAL & MGMT  11/29/2018   IR RADIOLOGIST EVAL & MGMT  12/06/2018   IR RADIOLOGIST EVAL & MGMT  12/25/2018   IR RADIOLOGIST EVAL & MGMT  01/08/2019   IR RADIOLOGIST EVAL & MGMT  01/29/2019   PANNICULECTOMY N/A 10/26/2018   Procedure: PANNICULECTOMY;  Surgeon: Gladis Cough, MD;  Location: [facility];  Service: General;  Laterality: N/A;   TENDON REPAIR   09/2015   left posterior tibial and peroneal tendon repairs   TOE AMPUTATION  01/2015   right 2nd toe   TONSILLECTOMY  age 54   TRANSOBTURATOR SLING  01-08-2004   dr watt @[facility]     Assessment / Plan / Recommendation Clinical Impression  A 59 year old right-handed female with past medical history significant for anxiety, hypertension, hyperlipidemia, diabetes mellitus with peripheral neuropathy, chronic low back pain, rheumatoid arthritis, RLS, CKD stage III with baseline 1.1, migraine headaches.  Per chart review patient lives alone.  Mobile home with 4 steps to entry.  Reportedly independent driving prior to admission.  Family can provide assistance as needed.  Presented 11/27/2024 to [facility] with headache and pain behind her right eye since recent laser surgery as well as blurred vision.  She developed some neck spasms that triggered upper extremity and facial numbness with left facial droop and nausea/vomiting.  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 Latest cranial CT scan 1/19 showing some concerns for vasospasm of the basilar artery.  Echocardiogram with ejection fraction greater than 75% no regional wall motion abnormalities grade 1 diastolic dysfunction. Tolerating a regular consistency diet.  Therapy evaluations completed and due to patient's decreased functional mobility patient to be admitted for a comprehensive inpatient rehab program.  Cognitive-Linguistic Evaluation: Patient presents with cognitive-linguistic function that appears [facility] across all tasks assessed, though of note, assessment was limited due to patient severe head and eye pain. Tasks completed were thus limited to the verbal modality with patient requesting to avoid any activities that required her to open her eyes due to assumed exacerbation of pain. Patient provided SLP with full detailed recollection of events leading up to hospitalization  and course since, including specific names of medications. Patient reports that when first presenting to the hospital, her expressive language was impaired however no deficits noted at this time, assume resolution of impairments. SLP verbally presented patient with mildly complex problem solving questions where she completed all questions independently. Patient also independently compared and contrasted various items, again verbally provided. No motor speech deficits appreciated. Patient reports that she is unsure how visual problem solving tasks would go given that she has not attempted them since hospitalization, however suspect these would go well. Patient agreeable to forgoing speech therapy at this time, but SLP recommended patient reconsult speech team should higher level deficits arise once head/eye pain has resolved and patient returns to completing visual-based iADL tasks. Patient agreeable. SLP will sign off.   Skilled Therapeutic Interventions          SLP conducted skilled evaluation session to assess cognitive linguistic function. Utilized functional verbal measures and patient and/or family interview. Full results above.   SLP Assessment  Patient does not need any further Speech Lanaguage Pathology Services    Recommendations  Oral Care Recommendations: Oral care BID Recommendations for Other Services: Neuropsych consult Patient destination: Home Follow up Recommendations: None Equipment Recommended: None recommended by SLP    SLP Frequency   N/a  SLP Duration  SLP Intensity  SLP Treatment/Interventions  N/a   N/a    N/a   Pain Pain Assessment Pain Scale: 0-10 Pain Score: 7  Pain Location: Head Pain Intervention(s): Medication (See eMAR)  Prior Functioning Cognitive/Linguistic Baseline: Within functional limits Type of Home: Mobile home  Lives With: Alone Available Help at Discharge: Family;Available 24 hours/day Vocation: Other (Comment) (has not returned to work  since eye surgery)  SLP Evaluation Cognition Overall Cognitive Status: Within Functional Limits for tasks assessed Arousal/Alertness: Awake/alert Orientation Level: Oriented X4 Year: 2026 Month: January Day of Week: Correct Attention: Sustained;Selective Sustained Attention: Appears intact Selective Attention: Appears intact Memory: Appears intact Awareness: Appears intact Problem Solving: Appears intact Safety/Judgment: Appears intact  Comprehension Auditory Comprehension Overall Auditory Comprehension: Appears within functional limits for tasks assessed Expression Expression Primary Mode of Expression: Verbal Verbal Expression Overall Verbal Expression: Appears within functional limits for tasks assessed Oral Motor Oral Motor/Sensory Function Overall Oral Motor/Sensory Function: Within functional limits Motor Speech Overall Motor Speech: Appears within functional limits for tasks assessed  Care Tool Care Tool Cognition Ability to hear (with hearing aid or hearing appliances if normally used Ability to hear (with hearing aid or hearing appliances if normally used): 0. Adequate - no difficulty in normal conservation, social interaction, listening to TV   Expression of Ideas and Wants Expression of Ideas and Wants: 4. Without difficulty (complex and basic) - expresses complex messages without difficulty and with speech that is clear and easy to understand   Understanding Verbal and Non-Verbal Content Understanding Verbal and Non-Verbal Content: 4. Understands (complex and basic) - clear comprehension without cues or repetitions  Memory/Recall Ability Memory/Recall Ability : Current season;Location of own room;Staff names and faces;That he or she is in a hospital/hospital unit   The  above assessment, treatment plan, treatment alternatives and goals were discussed and mutually agreed upon: by patient  Monzerrath Mcburney, M.A., CCC-SLP  Richardson Dubree A Javan Gonzaga 12/06/2024, 9:54 AM

## 2024-12-06 NOTE — Progress Notes (Signed)
 Inpatient Rehabilitation Center Individual Statement of Services  Patient Name:  Amy Adams  Date:  12/06/2024  Welcome to the Inpatient Rehabilitation Center.  Our goal is to provide you with an individualized program based on your diagnosis and situation, designed to meet your specific needs.  With this comprehensive rehabilitation program, you will be expected to participate in at least 3 hours of rehabilitation therapies Monday-Friday, with modified therapy programming on the weekends.  Your rehabilitation program will include the following services:  Physical Therapy (PT), Occupational Therapy (OT), Speech Therapy (ST), 24 hour per day rehabilitation nursing, Therapeutic Recreaction (TR), Care Coordinator, Rehabilitation Medicine, Nutrition Services, and Pharmacy Services  Weekly team conferences will be held on Wednesday to discuss your progress.  Your Inpatient Rehabilitation Care Coordinator will talk with you frequently to get your input and to update you on team discussions.  Team conferences with you and your family in attendance may also be held.  Expected length of stay: 5-7 days  Overall anticipated outcome: Mod/I   Depending on your progress and recovery, your program may change. Your Inpatient Rehabilitation Care Coordinator will coordinate services and will keep you informed of any changes. Your Inpatient Rehabilitation Care Coordinator's name and contact numbers are listed  below.  The following services may also be recommended but are not provided by the Inpatient Rehabilitation Center:  Driving Evaluations Home Health Rehabiltiation Services Outpatient Rehabilitation Services Vocational Rehabilitation   Arrangements will be made to provide these services after discharge if needed.  Arrangements include referral to agencies that provide these services.  Your insurance has been verified to be: MEDICAL SALES REPRESENTATIVE / [employer] [place] [employer]ES  Your primary doctor is:  Arloa Elsie SAUNDERS, MD    Pertinent information will be shared with your doctor and your insurance company.  Inpatient Rehabilitation Care Coordinator:  Di'Asia Loreli SIERRAS [phone] or ELIGAH BRINKS  Information discussed with and copy given to patient by: Waverly Loreli, 12/06/2024, 10:26 AM

## 2024-12-06 NOTE — Evaluation (Signed)
 Physical Therapy Assessment and Plan  Patient Details  Name: Amy Adams MRN: [id number] Date of Birth: 1966-02-19  PT Diagnosis: Abnormal posture, Abnormality of gait, Muscle weakness, and Pain in R neck/head Rehab Potential: Fair ELOS: 10-14 days   Today's Date: 12/06/2024 PT Individual Time: 1102-1200 PT Individual Time Calculation (min): 58 min    Hospital Problem: Principal Problem:   Subarachnoid hemorrhage (HCC)   Past Medical History:  Past Medical History:  Diagnosis Date   Anxiety    Arthritis    Chronic foot pain, left    GERD (gastroesophageal reflux disease)    History of anal fissures    Hyperlipidemia    Hypertension    Hypothyroidism    Insulin  dependent type 2 diabetes mellitus (HCC)    endrocrinologist-- dr tommas   Migraines    Peripheral neuropathy    RA (rheumatoid arthritis) [facility])    rheumatologist-  dr mai   SUI (stress urinary incontinence, female)    Wears glasses    Past Surgical History:  Past Surgical History:  Procedure Laterality Date   ABDOMINAL HYSTERECTOMY  1990   with Right Salpingo--ophorectomy   ANAL SPHINCTEROTOMY  01-05-2006    dr lorriane @[facility]    BUNIONECTOMY  09-25-2009   dr verta @[facility]    right great toe   CARPAL TUNNEL RELEASE Bilateral 1996;  1997   CESAREAN SECTION  1988   FOOT SURGERY  12/ 2014   dr verta   left 2nd hammertoe repair,  left 2nd metatarsal matrixectomy, left heel endoscopic plantar fasiotomy   IR CATHETER TUBE CHANGE  01/01/2019   IR RADIOLOGIST EVAL & MGMT  11/28/2018   IR RADIOLOGIST EVAL & MGMT  11/29/2018   IR RADIOLOGIST EVAL & MGMT  12/06/2018   IR RADIOLOGIST EVAL & MGMT  12/25/2018   IR RADIOLOGIST EVAL & MGMT  01/08/2019   IR RADIOLOGIST EVAL & MGMT  01/29/2019   PANNICULECTOMY N/A 10/26/2018   Procedure: PANNICULECTOMY;  Surgeon: Gladis Cough, MD;  Location: [facility];  Service: General;  Laterality: N/A;   TENDON REPAIR  09/2015   left posterior tibial and peroneal tendon repairs   TOE  AMPUTATION  01/2015   right 2nd toe   TONSILLECTOMY  age 76   TRANSOBTURATOR SLING  01-08-2004   dr watt @[facility]     Assessment & Plan Clinical Impression: Amy Adams is a 59 year old right-handed female with past medical history significant for anxiety, class I obesity with BMI 32.72, hypertension, hyperlipidemia, diabetes mellitus with peripheral neuropathy followed by Dr.Balan, chronic low back pain, rheumatoid arthritis followed by Dr. Mai maintained on Plaquenil  as well as Arava , RLS, CKD stage III with baseline 1.1, migraine headaches, anal fissures status post anal sphincterotomy 2007 and right eye laser surgery 6 weeks ago for glaucoma. Per chart review patient lives alone. Mobile home with 4 steps to entry. Reportedly independent driving prior to admission. Family can provide assistance as needed. Presented 11/27/2024 to [facility] with headache and pain behind her right eye since recent laser surgery as well as blurred vision. She developed some neck spasms that triggered upper extremity and facial numbness with left facial droop and nausea/vomiting. 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 CTA negative for large vessel occlusion or hemodynamically significant stenosis. Admission chemistries unremarkable except potassium 3.4, CO2 20, glucose 219, creatinine 1.12, hemoglobin A1c 6.7. BP noted to be elevated with systolics in the 180-190s. She was given a single dose  of labetalol  and started on Cleviprex  infusion and transitioned to Nimotop  and latest cranial CT scan 1/19 showing some concerns for vasospasm of the basilar artery. Echocardiogram with ejection fraction greater than 75% no regional wall motion abnormalities grade 1 diastolic dysfunction. She was transferred to [facility] admitted to the ICU. Neurosurgery Dr. Nancyann Burns consulted and advised conservative care. Initially placed on Keppra   for seizure prophylaxis completing course. Patient was cleared to begin Lovenox  for DVT prophylaxis 11/29/2024. Tolerating a regular consistency diet. Therapy evaluations completed and due to patient's decreased functional mobility was admitted for a comprehensive rehab program.    Patient currently requires min with mobility secondary to muscle weakness and decreased coordination and decreased motor planning.  Prior to hospitalization, patient was independent  with mobility and lived with Alone in a Mobile home home.  Home access is 5Stairs to enter.  Patient will benefit from skilled PT intervention to maximize safe functional mobility, minimize fall risk, and decrease caregiver burden for planned discharge home with 24 hour supervision.  Anticipate patient will benefit from follow up HH at discharge.  PT - End of Session Activity Tolerance: Tolerates 10 - 20 min activity with multiple rests Endurance Deficit: Yes PT Assessment Rehab Potential (ACUTE/IP ONLY): Fair PT Barriers to Discharge: Inaccessible home environment;Decreased caregiver support PT Patient demonstrates impairments in the following area(s): Balance;Pain;Safety;Endurance;Motor PT Transfers Functional Problem(s): Bed Mobility;Bed to Chair;Car;Furniture PT Locomotion Functional Problem(s): Ambulation;Wheelchair Mobility;Stairs PT Plan PT Intensity: Minimum of 1-2 x/day ,45 to 90 minutes PT Frequency: 5 out of 7 days PT Duration Estimated Length of Stay: 10-14 days PT Treatment/Interventions: Ambulation/gait training;Community reintegration;Neuromuscular re-education;UE/LE Strength taining/ROM;Stair training;Wheelchair propulsion/positioning;UE/LE Coordination activities;Therapeutic Activities;Pain management;Discharge planning;Balance/vestibular training;Functional mobility training;Patient/family education;Therapeutic Exercise PT Transfers Anticipated Outcome(s): supervision PT Locomotion Anticipated Outcome(s): supervision w/  LRAD PT Recommendation Recommendations for Other Services: Neuropsych consult Follow Up Recommendations: Home health PT Patient destination: Home Equipment Recommended: To be determined Equipment Details: pt has RW, SPC, commode chair, but no w/c if needed.   PT Evaluation Precautions/Restrictions Precautions Precautions: Fall Recall of Precautions/Restrictions: Impaired Precaution/Restrictions Comments: SBP<160 Restrictions Weight Bearing Restrictions Per Provider Order: No General PT Missed Treatment Reason: Not applicable Vital Signs  Pain Pain Assessment Pain Scale: 0-10 Pain Score: 8  Pain Type: Acute pain Pain Location: Head Pain Orientation: Right Pain Descriptors / Indicators: Throbbing Pain Onset: On-going Pain Intervention(s): Medication (See eMAR) Pain Interference Pain Interference Pain Effect on Sleep: 4. Almost constantly Pain Interference with Therapy Activities: 4. Almost constantly Pain Interference with Day-to-Day Activities: 4. Almost constantly Home Living/Prior Functioning Home Living Available Help at Discharge: Family;Available 24 hours/day Type of Home: Mobile home Home Access: Stairs to enter Entrance Stairs-Number of Steps: 5 Entrance Stairs-Rails: Left (states not very stable.) Home Layout: One level Bathroom Shower/Tub: Health Visitor: Standard Bathroom Accessibility: Yes Additional Comments: staying with mom at dc as above  Lives With: Alone Prior Function Level of Independence: Independent with transfers;Independent with gait  Able to Take Stairs?: Yes Driving: Yes Vocation: Other (Comment) (has not returned to work since eye surgery) Vision/Perception  Vision - History Ability to See in Adequate Light: 1 Impaired Vision - Assessment Eye Alignment: Within Functional Limits Ocular Range of Motion: Within Functional Limits Diplopia Assessment: Present in far gaze;Present in near gaze;Objects split side to  side Perception Perception: Within Functional Limits Praxis Praxis: WFL  Cognition Overall Cognitive Status: Within Functional Limits for tasks assessed Arousal/Alertness: Awake/alert Orientation Level: Oriented X4 Year: 2026 Month: January Day of Week:  Correct Attention: Sustained;Selective Sustained Attention: Appears intact Selective Attention: Appears intact Memory: Appears intact Awareness: Appears intact Problem Solving: Appears intact Safety/Judgment: Appears intact Sensation Sensation Light Touch: Appears Intact Coordination Gross Motor Movements are Fluid and Coordinated: No Fine Motor Movements are Fluid and Coordinated: Yes Finger Nose Finger Test: [facility] [place] Test: uncoordinated R > L Motor  Motor Motor: Within Functional Limits Motor - Skilled Clinical Observations: weakness R > L.   Trunk/Postural Assessment  Cervical Assessment Cervical Assessment: Exceptions to [facility] (forward head) Thoracic Assessment Thoracic Assessment: Exceptions to [facility] (rounded shoulders) Lumbar Assessment Lumbar Assessment: Exceptions to [facility] (post pelvic tilt.) Postural Control Postural Control: Within Functional Limits  Balance Balance Balance Assessed: Yes Static Sitting Balance Static Sitting - Balance Support: Feet supported Static Sitting - Level of Assistance: 5: Stand by assistance Dynamic Sitting Balance Dynamic Sitting - Balance Support: Feet supported Dynamic Sitting - Level of Assistance: 5: Stand by assistance Dynamic Sitting - Balance Activities: Lateral lean/weight shifting Sitting balance - Comments: supervision at EOB Static Standing Balance Static Standing - Balance Support: Bilateral upper extremity supported;During functional activity Static Standing - Level of Assistance: 4: Min assist;5: Stand by assistance Dynamic Standing Balance Dynamic Standing - Balance Support: Bilateral upper extremity supported;During functional activity Dynamic Standing -  Level of Assistance: 4: Min assist Extremity Assessment  RUE Assessment RUE Assessment: Within Functional Limits LUE Assessment LUE Assessment: Within Functional Limits RLE Assessment RLE Assessment: Within Functional Limits General Strength Comments: grossly 4/5 LLE Assessment LLE Assessment: Within Functional Limits General Strength Comments: grossly 4+/5  Care Tool Care Tool Bed Mobility Roll left and right activity   Roll left and right assist level: Supervision/Verbal cueing    Sit to lying activity   Sit to lying assist level: Supervision/Verbal cueing    Lying to sitting on side of bed activity   Lying to sitting on side of bed assist level: the ability to move from lying on the back to sitting on the side of the bed with no back support.: Supervision/Verbal cueing     Care Tool Transfers Sit to stand transfer   Sit to stand assist level: Contact Guard/Touching assist    Chair/bed transfer   Chair/bed transfer assist level: Minimal Assistance - Patient > 75%    Car transfer   Car transfer assist level: Minimal Assistance - Patient > 75%      Care Tool Locomotion Ambulation   Assist level: Minimal Assistance - Patient > 75% Assistive device: Walker-rolling Max distance: 12  Walk 10 feet activity   Assist level: Minimal Assistance - Patient > 75% Assistive device: Walker-rolling   Walk 50 feet with 2 turns activity Walk 50 feet with 2 turns activity did not occur: Safety/medical concerns      Walk 150 feet activity Walk 150 feet activity did not occur: Safety/medical concerns      Walk 10 feet on uneven surfaces activity Walk 10 feet on uneven surfaces activity did not occur: Safety/medical concerns      Stairs Stair activity did not occur: Safety/medical concerns        Walk up/down 1 step activity Walk up/down 1 step or curb (drop down) activity did not occur: Safety/medical concerns      Walk up/down 4 steps activity Walk up/down 4 steps activity  did not occur: Safety/medical concerns      Walk up/down 12 steps activity Walk up/down 12 steps activity did not occur: Safety/medical concerns      Pick up small objects  from floor Pick up small object from the floor (from standing position) activity did not occur: Safety/medical concerns      Wheelchair Is the patient using a wheelchair?: Yes Type of Wheelchair: Manual   Wheelchair assist level: Contact Guard/Touching assist Max wheelchair distance: 150  Wheel 50 feet with 2 turns activity   Assist Level: Contact Guard/Touching assist  Wheel 150 feet activity   Assist Level: Contact Guard/Touching assist    Refer to Care Plan for Long Term Goals  SHORT TERM GOAL WEEK 1 PT Short Term Goal 1 (Week 1): Pt will transfer sup to sit w/ mod I. PT Short Term Goal 2 (Week 1): Pt will transfer sit to stand w/ CGA consistently PT Short Term Goal 3 (Week 1): Pt will amb x 30' w/ RW and CGA. PT Short Term Goal 4 (Week 1): PT to assess stairs.  Recommendations for other services: Neuropsych  Skilled Therapeutic Intervention Evaluation completed (see details above and below) with education on PT POC and goals and individual treatment initiated with focus on  strength, endurance, gait, transfers, pain management, stairs.  Pt presents sidelying in bed and agreeable to therapy although c/o pain R neck and head.  Pt pre-medicated and allowed rests.  Pt wearing sunglasses 2/2 photophobia.  Pt transfers w/ CGA to EOB and given pants but LOB to R when attempting.  Pt transfers sit to stand and SPT to w/c w/ min A and cues. Pt wheeled x 150' w/ CGA to main gym.  Pt amb x 12' w/ RW and min A, w/ step-to to car and unsteady.  Pt required min A for transfers.  Pt returned to room and performed SPT to bed w/ min A.  Pt remained sidelying w/ bed alarm on and all needs in reach.   Mobility Bed Mobility Bed Mobility: Rolling Right;Rolling Left;Supine to Sit;Sit to Supine Rolling Right: Supervision/verbal  cueing Rolling Left: Supervision/Verbal cueing Supine to Sit: Supervision/Verbal cueing Sit to Supine: Supervision/Verbal cueing Transfers Transfers: Stand to Sit;Sit to Stand;Stand Pivot Transfers Sit to Stand: Contact Guard/Touching assist Stand to Sit: Contact Guard/Touching assist Stand Pivot Transfers: Minimal Assistance - Patient > 75% Stand Pivot Transfer Details: Verbal cues for precautions/safety;Verbal cues for technique Transfer (Assistive device): None Locomotion  Gait Ambulation: Yes Gait Assistance: Minimal Assistance - Patient > 75% Gait Distance (Feet): 12 Feet Assistive device: Rolling walker Gait Assistance Details: Verbal cues for gait pattern;Verbal cues for sequencing;Verbal cues for safe use of DME/AE Gait Gait: Yes Gait Pattern: Step-to pattern;Step-through pattern;Trunk flexed;Poor foot clearance - right;Poor foot clearance - left (knee wobbliness.) Gait velocity: reduced Stairs / Additional Locomotion Stairs: No Wheelchair Mobility Wheelchair Mobility: Yes Wheelchair Assistance: Veterinary Surgeon: Both upper extremities Wheelchair Parts Management: Needs assistance Distance: 150   Discharge Criteria: Patient will be discharged from PT if patient refuses treatment 3 consecutive times without medical reason, if treatment goals not met, if there is a change in medical status, if patient makes no progress towards goals or if patient is discharged from hospital.  The above assessment, treatment plan, treatment alternatives and goals were discussed and mutually agreed upon: by patient  Reyes SHAUNNA Sierra 12/06/2024, 12:16 PM

## 2024-12-06 NOTE — Progress Notes (Signed)
 Patient ID: Amy Adams, female   DOB: [birth year], 59 y.o.   MRN: [id number]  Statement of service delivered.

## 2024-12-06 NOTE — Plan of Care (Signed)
  Problem: RH BOWEL ELIMINATION Goal: RH STG MANAGE BOWEL WITH ASSISTANCE Description: STG Manage Bowel with mod I Assistance. Outcome: Progressing   Problem: RH SAFETY Goal: RH STG ADHERE TO SAFETY PRECAUTIONS W/ASSISTANCE/DEVICE Description: STG Adhere to Safety Precautions With cues Assistance/Device. Outcome: Progressing   Problem: RH PAIN MANAGEMENT Goal: RH STG PAIN MANAGED AT OR BELOW PT'S PAIN GOAL Description: Pain < 4 with prns Outcome: Progressing

## 2024-12-07 DIAGNOSIS — R197 Diarrhea, unspecified: Secondary | ICD-10-CM | POA: Diagnosis not present

## 2024-12-07 DIAGNOSIS — I609 Nontraumatic subarachnoid hemorrhage, unspecified: Secondary | ICD-10-CM | POA: Diagnosis not present

## 2024-12-07 DIAGNOSIS — E876 Hypokalemia: Secondary | ICD-10-CM | POA: Diagnosis not present

## 2024-12-07 DIAGNOSIS — I10 Essential (primary) hypertension: Secondary | ICD-10-CM | POA: Diagnosis not present

## 2024-12-07 LAB — BASIC METABOLIC PANEL WITH GFR
Anion gap: 13 (ref 5–15)
BUN: 25 mg/dL — ABNORMAL HIGH (ref 6–20)
CO2: 25 mmol/L (ref 22–32)
Calcium: 9.9 mg/dL (ref 8.9–10.3)
Chloride: 92 mmol/L — ABNORMAL LOW (ref 98–111)
Creatinine, Ser: 1.07 mg/dL — ABNORMAL HIGH (ref 0.44–1.00)
GFR, Estimated: 59 mL/min — ABNORMAL LOW
Glucose, Bld: 101 mg/dL — ABNORMAL HIGH (ref 70–99)
Potassium: 4 mmol/L (ref 3.5–5.1)
Sodium: 129 mmol/L — ABNORMAL LOW (ref 135–145)

## 2024-12-07 LAB — GLUCOSE, CAPILLARY
Glucose-Capillary: 111 mg/dL — ABNORMAL HIGH (ref 70–99)
Glucose-Capillary: 106 mg/dL — ABNORMAL HIGH (ref 70–99)
Glucose-Capillary: 136 mg/dL — ABNORMAL HIGH (ref 70–99)
Glucose-Capillary: 138 mg/dL — ABNORMAL HIGH (ref 70–99)

## 2024-12-07 MED ORDER — GABAPENTIN 300 MG PO CAPS
600.0000 mg | ORAL_CAPSULE | Freq: Three times a day (TID) | ORAL | Status: DC
  Administered 2024-12-07 – 2024-12-13 (×18): 600 mg via ORAL
  Filled 2024-12-07 (×18): qty 2

## 2024-12-07 MED ORDER — ALPRAZOLAM 0.25 MG PO TABS: 0.5000 mg | ORAL_TABLET | Freq: Three times a day (TID) | ORAL | Status: DC | PRN

## 2024-12-07 NOTE — Plan of Care (Signed)
" °  Problem: Consults Goal: RH STROKE PATIENT EDUCATION Description: See Patient Education module for education specifics  Outcome: Progressing Goal: Nutrition Consult-if indicated Outcome: Progressing Goal: Diabetes Guidelines if Diabetic/Glucose > 140 Description: If diabetic or lab glucose is > 140 mg/dl - Initiate Diabetes/Hyperglycemia Guidelines & Document Interventions  Outcome: Progressing   Problem: RH BOWEL ELIMINATION Goal: RH STG MANAGE BOWEL WITH ASSISTANCE Description: STG Manage Bowel with mod I  Assistance. Outcome: Progressing Goal: RH STG MANAGE BOWEL W/MEDICATION W/ASSISTANCE Description: STG Manage Bowel with Medication with mod I Assistance. Outcome: Progressing   Problem: RH SAFETY Goal: RH STG ADHERE TO SAFETY PRECAUTIONS W/ASSISTANCE/DEVICE Description: STG Adhere to Safety Precautions With cues Assistance/Device. Outcome: Progressing   Problem: RH PAIN MANAGEMENT Goal: RH STG PAIN MANAGED AT OR BELOW PT'S PAIN GOAL Description: Pain < 4 with prns Outcome: Progressing   Problem: RH KNOWLEDGE DEFICIT Goal: RH STG INCREASE KNOWLEDGE OF DIABETES Description: Patient will be able to manage DM using medications and dietary modification independently Outcome: Progressing Goal: RH STG INCREASE KNOWLEDGE OF HYPERTENSION Description: Patient will be able to manage HLD using medications and dietary modification independently Outcome: Progressing Goal: RH STG INCREASE KNOWLEGDE OF HYPERLIPIDEMIA Description: Patient will be able to manage HLD using medications and dietary modification independently Outcome: Progressing Goal: RH STG INCREASE KNOWLEDGE OF STROKE PROPHYLAXIS Description: Patient will be able to manage secondary risks using medications and dietary modification independently Outcome: Progressing   "

## 2024-12-07 NOTE — Progress Notes (Signed)
 Physical Therapy Session Note  Patient Details  Name: Amy Adams MRN: [id number] Date of Birth: [birth year]  Today's Date: 12/07/2024 PT Individual Time:  -      Short Term Goals: Week 1:  PT Short Term Goal 1 (Week 1): Pt will transfer sup to sit w/ mod I. PT Short Term Goal 2 (Week 1): Pt will transfer sit to stand w/ CGA consistently PT Short Term Goal 3 (Week 1): Pt will amb x 30' w/ RW and CGA. PT Short Term Goal 4 (Week 1): PT to assess stairs.  Pt missed 60 min of skilled therapy due to pain. Pt reported unrated high HA pain and politely declined PT to sleep it off. Nsg provided medication prior to arrival. PTA covered window with sheet and tape that was directly facing pt's eyes (blinds would not close). Will re-attempt as schedule and pt availability permits.     Therapy Documentation Precautions:  Precautions Precautions: Fall Recall of Precautions/Restrictions: Impaired Precaution/Restrictions Comments: SBP<160 Restrictions Weight Bearing Restrictions Per Provider Order: No  Therapy/Group: Individual Therapy  Corey Caulfield PTA 12/07/2024, 7:49 AM

## 2024-12-07 NOTE — Progress Notes (Signed)
 Occupational Therapy Session Note  Patient Details  Name: Amy Adams MRN: [id number] Date of Birth: Nov 30, 1965  Session 1: Today's Date: 12/07/2024 OT Individual Time: 9299-9184 OT Individual Time Calculation (min): 75 min   Session 2: Today's Date: 12/07/2024 OT Individual Time: 1300-1400 OT Individual Time Calculation (min): 60 min    Short Term Goals: Week 1:  OT Short Term Goal 1 (Week 1): STG+LTG d/t ELOS  Session 1: Skilled Therapeutic Interventions/Progress Updates:    Patient agreeable to participate in OT session. Reports 7/10 pain level, reported to RN for medication administration.   Patient participated in skilled OT session focusing on toileting, functional mobility/ OOB tolerance, bathing and dressing. Patient received in bed ready for OT. Patient ate breakfast with mod I. Patient then completed transfer to toilet with CG with RW. Patient completed toileting 3/3 with CGA and increased time due to difficulty voiding bladder. Patient then completed functional mobility to sink for grooming and blood draw. Patient then transitioned to shower for bathing in shower with overall sup to CGA. Patient required CGA to SUP for LB dressing, min A for functional mobility back to bed, SU for UB dressing. Returned to bed all needs in reach.     Session 2: Skilled Therapeutic Interventions/Progress Updates:    Patient agreeable to participate in OT session. Reports 5/10 pain level, premedicated. Patient sitting on EOB when OT arrived. Patient participated in skilled OT session focusing on functional mobility, activity tolerance, OOB tolerance. Dynamic balance. Patient transported to gym via wc. Patient required increased time for headache management due to lights. Patient then completed NuSTEP 12 minutes 50 spm pace matching with good activity tolerance to complete with one functional rest break. Patient then completed functional mobility with RW 160 ft with min A, 60 ft min A. Patient  then returned to room via wc. Patient completed toilet transfer with CG to min A. Transferred to NT for toileting.     Therapy Documentation Precautions:  Precautions Precautions: Fall Recall of Precautions/Restrictions: Impaired Precaution/Restrictions Comments: SBP<160 Restrictions Weight Bearing Restrictions Per Provider Order: No  Therapy/Group: Individual Therapy  D'mariea L Fabio Wah 12/07/2024, 6:56 AM

## 2024-12-07 NOTE — Progress Notes (Addendum)
 "                                                        PROGRESS NOTE   Subjective/Complaints: Reports continued headache and tingling in her face.  She has not been taking Xanax , last time she said it made her feel loopy.  She reports she did not take this every day PTA, using as needed for anxiety.  ROS: Patient denies fever, new vision changes, dizziness, vomiting,   shortness of breath or chest pain, or mood change. + diarrhea + headache +facial tingling-continued   Objective:   No results found.  Recent Labs    12/05/24 1952 12/06/24 0644  WBC 9.5 8.3  HGB 14.8 14.0  HCT 43.2 40.6  PLT 222 208   Recent Labs    12/06/24 0644 12/07/24 0740  NA 128* 129*  K 3.3* 4.0  CL 91* 92*  CO2 24 25  GLUCOSE 106* 101*  BUN 23* 25*  CREATININE 0.92 1.07*  CALCIUM  9.1 9.9    Intake/Output Summary (Last 24 hours) at 12/07/2024 1044 Last data filed at 12/07/2024 0932 Gross per 24 hour  Intake 840 ml  Output --  Net 840 ml        Physical Exam: Vital Signs Blood pressure 123/66, pulse 69, temperature 98.5 F (36.9 C), temperature source Oral, resp. rate 17, height 5' 8 (1.727 m), weight 99.9 kg, SpO2 96%.  Constitutional: No apparent distress.  Laying in bed  Eyes: PERRLA. EOMI. Visual fields grossly intact. + Pain in left eye with photophobia, EOMI, or exertion Wearing dark sunglasses   Cardiovascular: RRR, no murmurs/rub/gallops. No Edema Respiratory: CTAB.  Nonlabored breathing Abdomen: + bowel sounds, normoactive. No distention or tenderness.  Skin: C/D/I. No apparent lesions.    MSK:      No apparent deformity.       Neuro: Alert and oriented x 4, sensation intact to light touch in all 4 extremities, altered in bilateral face.  Left facial droop, no hypertonia noted.  Moving all 4 extremities to gravity and resistance 4+ to 5/5 limited by pain.  Prior exam: Neurologic exam:  Cognition: AAO to person, place, time and event.   + Cognitive/memory deficits,  complicated by persistent pain Language: Fluent, No substitutions or neoglisms.  Mild dysarthria Memory: Difficult to assess due to significant discomfort Insight: Fair insight into current condition.  Mood: Pleasant affect, appropriate mood.  Sensation: Reduced to light touch throughout both sides of her face, not on limbs. Reflexes: 2+ in BL UE and LEs. Negative Hoffman's and babinski signs bilaterally.   CN: Left facial droop, left vision difficulty, bilateral facial numbness/tingling Coordination: No apparent tremors or ataxia, complicated by vision/pain Spasticity: MAS 0 in all extremities.   Strength: Moving all 4 limbs antigravity against resistance, 5- out of 5 throughout.  Testing limited due to severe, worsening headaches with exertion.  Assessment/Plan: 1. Functional deficits which require 3+ hours per day of interdisciplinary therapy in a comprehensive inpatient rehab setting. Physiatrist is providing close team supervision and 24 hour management of active medical problems listed below. Physiatrist and rehab team continue to assess barriers to discharge/monitor patient progress toward functional and medical goals  Care Tool:  Bathing    Body parts bathed by patient: Right arm, Left arm, Left upper leg, Right lower leg,  Chest, Abdomen, Front perineal area, Buttocks, Right upper leg, Left lower leg, Face         Bathing assist Assist Level: Minimal Assistance - Patient > 75%     Upper Body Dressing/Undressing Upper body dressing   What is the patient wearing?: Pull over shirt    Upper body assist Assist Level: Supervision/Verbal cueing    Lower Body Dressing/Undressing Lower body dressing      What is the patient wearing?: Underwear/pull up, Pants     Lower body assist Assist for lower body dressing: Contact Guard/Touching assist     Toileting Toileting    Toileting assist Assist for toileting: Contact Guard/Touching assist     Transfers Chair/bed  transfer  Transfers assist     Chair/bed transfer assist level: Minimal Assistance - Patient > 75%     Locomotion Ambulation   Ambulation assist      Assist level: Minimal Assistance - Patient > 75% Assistive device: Walker-rolling Max distance: 12   Walk 10 feet activity   Assist     Assist level: Minimal Assistance - Patient > 75% Assistive device: Walker-rolling   Walk 50 feet activity   Assist Walk 50 feet with 2 turns activity did not occur: Safety/medical concerns         Walk 150 feet activity   Assist Walk 150 feet activity did not occur: Safety/medical concerns         Walk 10 feet on uneven surface  activity   Assist Walk 10 feet on uneven surfaces activity did not occur: Safety/medical concerns         Wheelchair     Assist Is the patient using a wheelchair?: Yes Type of Wheelchair: Manual    Wheelchair assist level: Contact Guard/Touching assist Max wheelchair distance: 150    Wheelchair 50 feet with 2 turns activity    Assist        Assist Level: Contact Guard/Touching assist   Wheelchair 150 feet activity     Assist      Assist Level: Contact Guard/Touching assist   Blood pressure 123/66, pulse 69, temperature 98.5 F (36.9 C), temperature source Oral, resp. rate 17, height 5' 8 (1.727 m), weight 99.9 kg, SpO2 96%.  Medical Problem List and Plan: 1. 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  Antithrombotics: -DVT/anticoagulation:  Pharmaceutical: Lovenox  initiated 11/29/2024             -antiplatelet therapy: N/A 3. Pain Management/migraine headaches: Lidocaine  patch, Neurontin  400 mg 3 times daily,, Fioricet  as needed, Flexeril  5 mg 3 times daily as needed, oxycodone  5 to 10 mg every 4 hours as needed  severe pain  -1/24 did not tolerate topamax in past, increase neurontin  dose for pain 4. Mood/Behavior/Sleep: Cymbalta  60 mg twice daily, Xanax  1 mg 3 times daily             -antipsychotic agents: N/A   5. Neuropsych/cognition: This patient is capable of making decisions on her own behalf. 6. Skin/Wound Care: Routine skin checks 7. Fluids/Electrolytes/Nutrition: Routine ins and outs with follow-up chemistries 8.  Hypertension.  Norvasc  10 mg daily, clonidine  0.2 mg 3 times daily, HCTZ 25 mg  daily, Avapro  300 mg daily, Lopressor  50 mg twice daily.  Monitor with increased mobility  - 1/23 Stop hydrochlorothiazide  due to hyponatremia  -1/24 BP stable, monitor    12/07/2024    4:51 AM 12/06/2024    8:48 PM 12/06/2024    1:19 PM  Vitals with BMI  Systolic 123 136 871  Diastolic 66 70 71  Pulse 69 72 66    9.  Diabetes mellitus.  Hemoglobin A1c 6.7.  Currently SSI/Lantus  insulin  14 units daily.  Prior to admission patient on Humalog  10-15 units twice daily, Mounjaro  15 mg weekly, Glucophage  1000 mg twice daily.  Patient is followed by endocrinology services Dr. Tommas  - 1/23 controlled continue current regimen  -1/24 CBGs controlled, continue current regimen  CBG (last 3)  Recent Labs    12/06/24 1630 12/06/24 2048 12/07/24 0609  GLUCAP 133* 87 111*    10.  CKD stage III.  Creatinine baseline 1.1.  Follow-up chemistries  - 1/23 BUN a little higher at 23, creatinine down to 0.92.  Will check PVRs just to make sure she is not retaining  -1/24 BUN and CR a little higher but overall stable, bladder scans were 0x2 11.  Rheumatoid arthritis.  Chronic immunosuppressants.  Continue Plaquenil  as well as Arava .  Follow-up rheumatology services 12.  Glaucoma.  Recent right eye laser surgery 6 weeks ago.  Follow-up outpatient 13.  Hypothyroidism.  Synthroid  14.  Class I obesity.  BMI 32.72.  Dietary follow-up 15.  GERD.  Protonix  16.  Hyperlipidemia.  Fenofibrate . 17.  History of anal  fissures.  Status post anal sphincterotomy 2007.  Follow-up outpatient 18.  Hyponatremia   -1/23 Na 128 this Am, DC hydrochlorothiazide , will recheck tomorrow.  Will do fluid restriction.  Discussed with nephrology Dr. Tobie appreciate assistance  -1/24 Na a little improved to 129, continue to monitor trend 19. Hypokalemia  -1/23 20 mEq twice daily ordered, she got the first dose but after speaking with nephrology will increase second dose to 40 mEq because she may be total body depleted  -1/24 improved today K+ 4.0 20.  Diarrhea  - Will avoid scheduled laxatives.  Continue to monitor  -LBM 1/23  LOS: 2 days A FACE TO FACE EVALUATION WAS PERFORMED  Murray Collier 12/07/2024, 10:44 AM     "

## 2024-12-08 DIAGNOSIS — I609 Nontraumatic subarachnoid hemorrhage, unspecified: Secondary | ICD-10-CM | POA: Diagnosis not present

## 2024-12-08 DIAGNOSIS — R197 Diarrhea, unspecified: Secondary | ICD-10-CM | POA: Diagnosis not present

## 2024-12-08 DIAGNOSIS — I10 Essential (primary) hypertension: Secondary | ICD-10-CM | POA: Diagnosis not present

## 2024-12-08 DIAGNOSIS — E876 Hypokalemia: Secondary | ICD-10-CM | POA: Diagnosis not present

## 2024-12-08 LAB — BASIC METABOLIC PANEL WITH GFR
Anion gap: 12 (ref 5–15)
BUN: 24 mg/dL — ABNORMAL HIGH (ref 6–20)
CO2: 25 mmol/L (ref 22–32)
Calcium: 9.2 mg/dL (ref 8.9–10.3)
Chloride: 92 mmol/L — ABNORMAL LOW (ref 98–111)
Creatinine, Ser: 1.04 mg/dL — ABNORMAL HIGH (ref 0.44–1.00)
GFR, Estimated: >60 mL/min
Glucose, Bld: 142 mg/dL — ABNORMAL HIGH (ref 70–99)
Potassium: 3.7 mmol/L (ref 3.5–5.1)
Sodium: 128 mmol/L — ABNORMAL LOW (ref 135–145)

## 2024-12-08 LAB — GLUCOSE, CAPILLARY
Glucose-Capillary: 141 mg/dL — ABNORMAL HIGH (ref 70–99)
Glucose-Capillary: 109 mg/dL — ABNORMAL HIGH (ref 70–99)
Glucose-Capillary: 120 mg/dL — ABNORMAL HIGH (ref 70–99)
Glucose-Capillary: 192 mg/dL — ABNORMAL HIGH (ref 70–99)

## 2024-12-08 LAB — OSMOLALITY, URINE: Osmolality, Ur: 428 mosm/kg (ref 300–900)

## 2024-12-08 LAB — SODIUM, URINE, RANDOM: Sodium, Ur: <30 mmol/L

## 2024-12-08 NOTE — Plan of Care (Signed)
?  Problem: RH BOWEL ELIMINATION ?Goal: RH STG MANAGE BOWEL WITH ASSISTANCE ?Description: STG Manage Bowel with mod I Assistance. ?Outcome: Progressing ?Goal: RH STG MANAGE BOWEL W/MEDICATION W/ASSISTANCE ?Description: STG Manage Bowel with Medication with mod I  Assistance. ?Outcome: Progressing ?  ?Problem: RH SAFETY ?Goal: RH STG ADHERE TO SAFETY PRECAUTIONS W/ASSISTANCE/DEVICE ?Description: STG Adhere to Safety Precautions With cues Assistance/Device. ?Outcome: Progressing ?  ?

## 2024-12-08 NOTE — IPOC Note (Signed)
 Overall Plan of Care [facility]) Patient Details Name: Amy Adams MRN: [id number] DOB: 1966-03-24  Admitting Diagnosis: Subarachnoid hemorrhage [facility])  Hospital Problems: Principal Problem:   Subarachnoid hemorrhage (HCC)     Functional Problem List: Nursing Bowel, Endurance, Medication Management, Pain, Safety  PT Balance, Pain, Safety, Endurance, Motor  OT Behavior, Motor, Perception  SLP    TR         Basic ADLs: OT Bathing, Dressing, Toileting     Advanced  ADLs: OT Light Housekeeping     Transfers: PT Bed Mobility, Bed to Chair, Car, Occupational Psychologist, Research Scientist (life Sciences): PT Ambulation, Psychologist, [employer], Stairs     Additional Impairments: OT None  SLP None      TR      Anticipated Outcomes Item Anticipated Outcome  Self Feeding mod I  Swallowing      Basic self-care  mod I to SUP  Toileting  mod I   Bathroom Transfers SUP  Bowel/Bladder  manage bowel w mod I assist  Transfers  supervision  Locomotion  supervision w/ LRAD  Communication     Cognition     Pain  Pain < 4 with prns  Safety/Judgment  manage safety w cues   Therapy Plan: PT Intensity: Minimum of 1-2 x/day ,45 to 90 minutes PT Frequency: 5 out of 7 days PT Duration Estimated Length of Stay: 10-14 days OT Intensity: Minimum of 1-2 x/day, 45 to 90 minutes OT Frequency: 5 out of 7 days OT Duration/Estimated Length of Stay: 7-10 mod I to SUP. May need additional time for PT     Team Interventions: Nursing Interventions Patient/Family Education, Bowel Management, Disease Management/Prevention, Pain Management, Medication Management, Discharge Planning  PT interventions Ambulation/gait training, Community reintegration, Neuromuscular re-education, UE/LE Strength taining/ROM, Stair training, Wheelchair propulsion/positioning, UE/LE Coordination activities, Therapeutic Activities, Pain management, Discharge planning, Warden/ranger, Functional mobility  training, Patient/family education, Therapeutic Exercise  OT Interventions Balance/vestibular training, Disease mangement/prevention, Neuromuscular re-education, Self Care/advanced ADL retraining, Therapeutic Exercise, UE/LE Strength taining/ROM, Skin care/wound managment, Pain management, DME/adaptive equipment instruction, Cognitive remediation/compensation, Community reintegration, Equities Trader education, UE/LE Coordination activities, Discharge planning, Functional mobility training, Therapeutic Activities, Visual/perceptual remediation/compensation  SLP Interventions    TR Interventions    SW/CM Interventions Discharge Planning, Psychosocial Support, Patient/Family Education, Disease Management/Prevention   Barriers to Discharge MD  Medical stability  Nursing Home environment access/layout, Decreased caregiver support mobile home w 4 ste left rail w mother  PT Inaccessible home environment, Decreased caregiver support    OT Lack of/limited family support, Decreased caregiver support headache, activity tolerance  SLP      SW       Team Discharge Planning: Destination: PT-Home ,OT- Home , SLP-Home Projected Follow-up: PT-Home health PT, OT-  24 hour supervision/assistance, SLP-None Projected Equipment Needs: PT-To be determined, OT- 3 in 1 bedside comode, Tub/shower seat, SLP-None recommended by SLP Equipment Details: PT-pt has RW, SPC, commode chair, but no w/c if needed., OT-  Patient/family involved in discharge planning: PT- Patient,  OT-Patient, SLP-Patient  MD ELOS: 10-12 days Medical Rehab Prognosis:  Excellent Assessment: The patient has been admitted for CIR therapies with the diagnosis of 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 The team will be addressing functional mobility, strength, stamina, balance, safety, adaptive techniques and equipment, self-care, bowel and bladder mgt, patient and caregiver education. Goals have  been set at sup/mod I. Anticipated discharge destination is home.        See Team  Conference Notes for weekly updates to the plan of care

## 2024-12-08 NOTE — Progress Notes (Addendum)
 "                                                        PROGRESS NOTE   Subjective/Complaints: Reports continued headache although medications are helping.  LBM about 2 days ago.  She reports that she has been limiting salt in her food.  ROS: Patient denies fever, new vision changes, dizziness, vomiting,   shortness of breath or chest pain, or mood change. + diarrhea-improved + headache +facial tingling-continued   Objective:   No results found.  Recent Labs    12/05/24 1952 12/06/24 0644  WBC 9.5 8.3  HGB 14.8 14.0  HCT 43.2 40.6  PLT 222 208   Recent Labs    12/07/24 0740 12/08/24 0711  NA 129* 128*  K 4.0 3.7  CL 92* 92*  CO2 25 25  GLUCOSE 101* 142*  BUN 25* 24*  CREATININE 1.07* 1.04*  CALCIUM  9.9 9.2    Intake/Output Summary (Last 24 hours) at 12/08/2024 1347 Last data filed at 12/08/2024 1334 Gross per 24 hour  Intake 1080 ml  Output --  Net 1080 ml        Physical Exam: Vital Signs Blood pressure (!) 130/59, pulse 67, temperature 98.4 F (36.9 C), temperature source Oral, resp. rate 17, height 5' 8 (1.727 m), weight 99.9 kg, SpO2 96%.  Constitutional: No apparent distress.  Laying in bed.  Appears comfortable overall Eyes: PERRLA. EOMI. Visual fields grossly intact. + Pain in left eye with photophobia, EOMI, or exertion   Cardiovascular: RRR, no murmurs/rub/gallops. No Edema Respiratory: CTAB.  Nonlabored breathing Abdomen: + bowel sounds, normoactive. No distention or tenderness.  Skin: C/D/I. No apparent lesions.    MSK:      No apparent deformity.       Neuro: Alert and oriented x 4, sensation intact to light touch in all 4 extremities, altered in bilateral face.  Left facial droop, no hypertonia noted.  Moving all 4 extremities to gravity and resistance 4+ to 5/5 limited by pain.  Prior exam: Neurologic exam:  Cognition: AAO to person, place, time and event.   + Cognitive/memory deficits, complicated by persistent pain Language:  Fluent, No substitutions or neoglisms.  Mild dysarthria Memory: Difficult to assess due to significant discomfort Insight: Fair insight into current condition.  Mood: Pleasant affect, appropriate mood.  Sensation: Reduced to light touch throughout both sides of her face, not on limbs. Reflexes: 2+ in BL UE and LEs. Negative Hoffman's and babinski signs bilaterally.   CN: Left facial droop, left vision difficulty, bilateral facial numbness/tingling Coordination: No apparent tremors or ataxia, complicated by vision/pain Spasticity: MAS 0 in all extremities.   Strength: Moving all 4 limbs antigravity against resistance, 5- out of 5 throughout.  Testing limited due to severe, worsening headaches with exertion.  Assessment/Plan: 1. Functional deficits which require 3+ hours per day of interdisciplinary therapy in a comprehensive inpatient rehab setting. Physiatrist is providing close team supervision and 24 hour management of active medical problems listed below. Physiatrist and rehab team continue to assess barriers to discharge/monitor patient progress toward functional and medical goals  Care Tool:  Bathing    Body parts bathed by patient: Right arm, Left arm, Left upper leg, Right lower leg, Chest, Abdomen, Front perineal area, Buttocks, Right upper leg, Left lower leg, Face  Bathing assist Assist Level: Minimal Assistance - Patient > 75%     Upper Body Dressing/Undressing Upper body dressing   What is the patient wearing?: Pull over shirt    Upper body assist Assist Level: Supervision/Verbal cueing    Lower Body Dressing/Undressing Lower body dressing      What is the patient wearing?: Underwear/pull up, Pants     Lower body assist Assist for lower body dressing: Contact Guard/Touching assist     Toileting Toileting    Toileting assist Assist for toileting: Contact Guard/Touching assist     Transfers Chair/bed transfer  Transfers assist     Chair/bed  transfer assist level: Minimal Assistance - Patient > 75%     Locomotion Ambulation   Ambulation assist      Assist level: Minimal Assistance - Patient > 75% Assistive device: Walker-rolling Max distance: 12   Walk 10 feet activity   Assist     Assist level: Minimal Assistance - Patient > 75% Assistive device: Walker-rolling   Walk 50 feet activity   Assist Walk 50 feet with 2 turns activity did not occur: Safety/medical concerns         Walk 150 feet activity   Assist Walk 150 feet activity did not occur: Safety/medical concerns         Walk 10 feet on uneven surface  activity   Assist Walk 10 feet on uneven surfaces activity did not occur: Safety/medical concerns         Wheelchair     Assist Is the patient using a wheelchair?: Yes Type of Wheelchair: Manual    Wheelchair assist level: Contact Guard/Touching assist Max wheelchair distance: 150    Wheelchair 50 feet with 2 turns activity    Assist        Assist Level: Contact Guard/Touching assist   Wheelchair 150 feet activity     Assist      Assist Level: Contact Guard/Touching assist   Blood pressure (!) 130/59, pulse 67, temperature 98.4 F (36.9 C), temperature source Oral, resp. rate 17, height 5' 8 (1.727 m), weight 99.9 kg, SpO2 96%.  Medical Problem List and Plan: 1. 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  Antithrombotics: -DVT/anticoagulation:  Pharmaceutical: Lovenox  initiated 11/29/2024             -antiplatelet therapy: N/A 3. Pain Management/migraine headaches: Lidocaine  patch, Neurontin  400 mg 3 times daily,, Fioricet  as needed, Flexeril  5 mg 3 times daily as needed, oxycodone  5 to 10 mg every 4 hours as needed severe pain.  Continue  beta-blocker  -1/24 did not tolerate topamax in past, increase neurontin  dose to 600mg  TID 4. Mood/Behavior/Sleep: Cymbalta  60 mg twice daily, Xanax  1 mg 3 times daily             -antipsychotic agents: N/A  - 1/25 Xanax  dose decreased yesterday and changed to as needed-reports it made her feel a little loopy.   5. Neuropsych/cognition: This patient is capable of making decisions on her own behalf. 6. Skin/Wound Care: Routine skin checks 7. Fluids/Electrolytes/Nutrition: Routine ins and outs with follow-up chemistries 8.  Hypertension.  Norvasc  10 mg daily, clonidine  0.2 mg 3 times daily,  HCTZ 25 mg daily, Avapro  300 mg daily, Lopressor  50 mg twice daily.  Monitor with increased mobility  - 1/23 Stop hydrochlorothiazide  due to hyponatremia  -1/24-25 BP stable, monitor    12/08/2024    5:05 AM 12/07/2024    7:32 PM 12/07/2024    4:49 PM  Vitals with BMI  Systolic 130 139 888  Diastolic 59 63 80  Pulse 67 71 70    9.  Diabetes mellitus.  Hemoglobin A1c 6.7.  Currently SSI/Lantus  insulin  14 units daily.  Prior to admission patient on Humalog  10-15 units twice daily, Mounjaro  15 mg weekly, Glucophage  1000 mg twice daily.  Patient is followed by endocrinology services Dr. Tommas  - 1/23 controlled continue current regimen  -1/24 - 25 CBGs controlled, continue current regimen  CBG (last 3)  Recent Labs    12/07/24 2026 12/08/24 0611 12/08/24 1136  GLUCAP 138* 141* 109*    10.  CKD stage III.  Creatinine baseline 1.1.  Follow-up chemistries  - 1/23 BUN a little higher at 23, creatinine down to 0.92.  Will check PVRs just to make sure she is not retaining  -1/24 BUN and CR a little higher but overall stable, bladder scans were 0x3- discontinue  -1/25 BUN and creatinine overall stable and 24/1.04 11.  Rheumatoid arthritis.  Chronic immunosuppressants.  Continue Plaquenil  as well as Arava .  Follow-up rheumatology services 12.  Glaucoma.  Recent right eye laser surgery 6 weeks ago.   Follow-up outpatient 13.  Hypothyroidism.  Synthroid  14.  Class I obesity.  BMI 32.72.  Dietary follow-up 15.  GERD.  Protonix  16.  Hyperlipidemia.  Fenofibrate . 17.  History of anal fissures.  Status post anal sphincterotomy 2007.  Follow-up outpatient 18.  Hyponatremia   -1/23 Na 128 this Am, DC hydrochlorothiazide , will recheck tomorrow.  Will do fluid restriction.  Discussed with nephrology Dr. Tobie appreciate assistance  -1/25 sodium still low but overall stable at 129 yesterday 128 today.  Will check urine sodium and osmolality. Consider DC cymbalta . May need renal consult if not improving.  19. Hypokalemia  -1/23 20 mEq twice daily ordered, she got the first dose but after speaking with nephrology will increase second dose to 40 mEq because she may be total body depleted  -1/25 potassium stable 3.7 20.  Diarrhea  - Will avoid scheduled laxatives.  Continue to monitor  -LBM 1/23, will hold off on laxatives today as she reports chronic issues with diarrhea and feels like she will have bowel movement soon  LOS: 3 days A FACE TO FACE EVALUATION WAS PERFORMED  Murray Collier 12/08/2024, 1:47 PM     "

## 2024-12-09 DIAGNOSIS — E871 Hypo-osmolality and hyponatremia: Secondary | ICD-10-CM | POA: Diagnosis not present

## 2024-12-09 DIAGNOSIS — N183 Chronic kidney disease, stage 3 unspecified: Secondary | ICD-10-CM

## 2024-12-09 DIAGNOSIS — G441 Vascular headache, not elsewhere classified: Secondary | ICD-10-CM

## 2024-12-09 DIAGNOSIS — I609 Nontraumatic subarachnoid hemorrhage, unspecified: Secondary | ICD-10-CM | POA: Diagnosis not present

## 2024-12-09 DIAGNOSIS — E876 Hypokalemia: Secondary | ICD-10-CM | POA: Diagnosis not present

## 2024-12-09 LAB — BASIC METABOLIC PANEL WITH GFR
Anion gap: 8 (ref 5–15)
BUN: 24 mg/dL — ABNORMAL HIGH (ref 6–20)
CO2: 29 mmol/L (ref 22–32)
Calcium: 9 mg/dL (ref 8.9–10.3)
Chloride: 94 mmol/L — ABNORMAL LOW (ref 98–111)
Creatinine, Ser: 1.03 mg/dL — ABNORMAL HIGH (ref 0.44–1.00)
GFR, Estimated: >60 mL/min
Glucose, Bld: 118 mg/dL — ABNORMAL HIGH (ref 70–99)
Potassium: 4 mmol/L (ref 3.5–5.1)
Sodium: 131 mmol/L — ABNORMAL LOW (ref 135–145)

## 2024-12-09 LAB — GLUCOSE, CAPILLARY
Glucose-Capillary: 127 mg/dL — ABNORMAL HIGH (ref 70–99)
Glucose-Capillary: 137 mg/dL — ABNORMAL HIGH (ref 70–99)
Glucose-Capillary: 142 mg/dL — ABNORMAL HIGH (ref 70–99)
Glucose-Capillary: 134 mg/dL — ABNORMAL HIGH (ref 70–99)

## 2024-12-09 MED ORDER — SUMATRIPTAN SUCCINATE 50 MG PO TABS
50.0000 mg | ORAL_TABLET | ORAL | Status: DC | PRN
  Administered 2024-12-09 – 2024-12-10 (×3): 50 mg via ORAL
  Filled 2024-12-09 (×5): qty 1

## 2024-12-09 MED ORDER — DIVALPROEX SODIUM 250 MG PO DR TAB
250.0000 mg | DELAYED_RELEASE_TABLET | Freq: Two times a day (BID) | ORAL | Status: DC
  Administered 2024-12-09 – 2024-12-10 (×3): 250 mg via ORAL
  Filled 2024-12-09 (×3): qty 1

## 2024-12-09 NOTE — Progress Notes (Signed)
 Physical Therapy Session Note  Patient Details  Name: Amy Adams MRN: [id number] Date of Birth: [birth year]  Today's Date: 12/09/2024 PT Missed Time: 30 Minutes Missed Time Reason: Pain;Patient unwilling to participate  Short Term Goals: Week 1:  PT Short Term Goal 1 (Week 1): Pt will transfer sup to sit w/ mod I. PT Short Term Goal 2 (Week 1): Pt will transfer sit to stand w/ CGA consistently PT Short Term Goal 3 (Week 1): Pt will amb x 30' w/ RW and CGA. PT Short Term Goal 4 (Week 1): PT to assess stairs.  Skilled Therapeutic Interventions/Progress Updates:     Pt in bed upon PT arrival. Pt states she cannot do anything due to terrible headache. PT encourages participation but pt continues to refuse. PT will follow up as able.   Therapy Documentation Precautions:  Precautions Precautions: Fall Recall of Precautions/Restrictions: Impaired Precaution/Restrictions Comments: SBP<160 Restrictions Weight Bearing Restrictions Per Provider Order: No   Therapy/Group: Individual Therapy  Elsie JAYSON Dawn, PT, DPT 12/09/2024, 3:28 PM

## 2024-12-09 NOTE — Progress Notes (Signed)
 Occupational Therapy Session Note  Patient Details  Name: Amy Adams MRN: [id number] Date of Birth: 1966/10/10  Today's Date: 12/09/2024 OT Individual Time: 8698-8644 OT Individual Time Calculation (min): 54 min    Short Term Goals: Week 1:  OT Short Term Goal 1 (Week 1): STG+LTG d/t ELOS  Skilled Therapeutic Interventions/Progress Updates:   Patient received supported sitting in bed talking with her son on the phone.  Patient agreeable to OT although states she will be sleepy soon due to pain medication.   Patient requesting walking to stretch her legs, and then a shower.   Patient walked with walker with CGA to close supervision in hallway to nurses station.  Patient with mild word finding deficits - she is aware and easily frustrated with this.   Patient showered while seated without further assistance.  Patient dressed herself while seated on tub bench.  Patient returned to bed at end of session.  Bed alarm engaged and call bell/ personal items in reach.  Direct handoff to nurse.    Therapy Documentation Precautions:  Precautions Precautions: Fall Recall of Precautions/Restrictions: Impaired Precaution/Restrictions Comments: SBP<160 Restrictions Weight Bearing Restrictions Per Provider Order: No Pain:  Denies pain - as she reports medication is kicking in.    Therapy/Group: Individual Therapy  Meredyth Hornung M 12/09/2024, 1:56 PM

## 2024-12-09 NOTE — Plan of Care (Signed)
" °  Problem: Consults Goal: RH STROKE PATIENT EDUCATION Description: See Patient Education module for education specifics  Outcome: Progressing Goal: Nutrition Consult-if indicated Outcome: Progressing Goal: Diabetes Guidelines if Diabetic/Glucose > 140 Description: If diabetic or lab glucose is > 140 mg/dl - Initiate Diabetes/Hyperglycemia Guidelines & Document Interventions  Outcome: Progressing   Problem: RH BOWEL ELIMINATION Goal: RH STG MANAGE BOWEL WITH ASSISTANCE Description: STG Manage Bowel with mod I  Assistance. Outcome: Progressing Goal: RH STG MANAGE BOWEL W/MEDICATION W/ASSISTANCE Description: STG Manage Bowel with Medication with mod I Assistance. Outcome: Progressing   Problem: RH SAFETY Goal: RH STG ADHERE TO SAFETY PRECAUTIONS W/ASSISTANCE/DEVICE Description: STG Adhere to Safety Precautions With cues Assistance/Device. Outcome: Progressing   Problem: RH PAIN MANAGEMENT Goal: RH STG PAIN MANAGED AT OR BELOW PT'S PAIN GOAL Description: Pain < 4 with prns Outcome: Progressing   Problem: RH KNOWLEDGE DEFICIT Goal: RH STG INCREASE KNOWLEDGE OF DIABETES Description: Patient will be able to manage DM using medications and dietary modification independently Outcome: Progressing Goal: RH STG INCREASE KNOWLEDGE OF HYPERTENSION Description: Patient will be able to manage HLD using medications and dietary modification independently Outcome: Progressing Goal: RH STG INCREASE KNOWLEGDE OF HYPERLIPIDEMIA Description: Patient will be able to manage HLD using medications and dietary modification independently Outcome: Progressing Goal: RH STG INCREASE KNOWLEDGE OF STROKE PROPHYLAXIS Description: Patient will be able to manage secondary risks using medications and dietary modification independently Outcome: Progressing   "

## 2024-12-09 NOTE — Progress Notes (Addendum)
 "                                                        PROGRESS NOTE   Subjective/Complaints: Pt with persistent, severe headache. She says she feels like her head is splitting. Had pain in her right ear, visual aura. Concerned that she's going to go home this week with headaches uncontrolled   Objective:   No results found.  No results for input(s): WBC, HGB, HCT, PLT in the last 72 hours.  Recent Labs    12/08/24 0711 12/09/24 0433  NA 128* 131*  K 3.7 4.0  CL 92* 94*  CO2 25 29  GLUCOSE 142* 118*  BUN 24* 24*  CREATININE 1.04* 1.03*  CALCIUM  9.2 9.0    Intake/Output Summary (Last 24 hours) at 12/09/2024 0944 Last data filed at 12/08/2024 1334 Gross per 24 hour  Intake 240 ml  Output --  Net 240 ml        Physical Exam: Vital Signs Blood pressure (!) 122/56, pulse (!) 59, temperature 97.7 F (36.5 C), temperature source Oral, resp. rate 16, height 5' 8 (1.727 m), weight 99.9 kg, SpO2 98%.  Constitutional: appears uncomfortable . Vital signs reviewed. HEENT: NCAT, EOMI, oral membranes moist Neck: supple Cardiovascular: RRR without murmur. No JVD    Respiratory/Chest: CTA Bilaterally without wheezes or rales. Normal effort    GI/Abdomen: BS +, non-tender, non-distended Ext: no clubbing, cyanosis, or edema Psych: pleasant but anxious and talkative  Skin: C/D/I. No apparent lesions.    MSK:      No apparent deformity.       Neuro: Alert and oriented x 4, sensation intact to light touch in all 4 extremities, altered in bilateral face.  Left facial droop, no hypertonia noted.  Moving all 4 extremities to gravity and resistance 4+ to 5/5 limited by pain.  Cognition: AAO to person, place, time and event.   + Cognitive/memory deficits. She is distracted by persistent pain yet again today Language: Fluent, No substitutions or neoglisms.  Mild dysarthria   Insight: Fair insight into current condition.  Mood: Pleasant affect, appropriate mood.  Sensation:  Reduced to light touch throughout both sides of her face, not on limbs. Reflexes: 2+ in BL UE and LEs. Negative Hoffman's and babinski signs bilaterally.   CN: Left facial droop, left vision difficulty, bilateral facial numbness/tingling Coordination: No apparent tremors or ataxia, complicated by vision/pain Spasticity: MAS 0 in all extremities.   Strength: Moving all 4 limbs antigravity against resistance, 5- out of 5 throughout.     Assessment/Plan: 1. Functional deficits which require 3+ hours per day of interdisciplinary therapy in a comprehensive inpatient rehab setting. Physiatrist is providing close team supervision and 24 hour management of active medical problems listed below. Physiatrist and rehab team continue to assess barriers to discharge/monitor patient progress toward functional and medical goals  Care Tool:  Bathing    Body parts bathed by patient: Right arm, Left arm, Left upper leg, Right lower leg, Chest, Abdomen, Front perineal area, Buttocks, Right upper leg, Left lower leg, Face         Bathing assist Assist Level: Minimal Assistance - Patient > 75%     Upper Body Dressing/Undressing Upper body dressing   What is the patient wearing?: Pull over shirt    Upper  body assist Assist Level: Supervision/Verbal cueing    Lower Body Dressing/Undressing Lower body dressing      What is the patient wearing?: Underwear/pull up, Pants     Lower body assist Assist for lower body dressing: Contact Guard/Touching assist     Toileting Toileting    Toileting assist Assist for toileting: Contact Guard/Touching assist     Transfers Chair/bed transfer  Transfers assist     Chair/bed transfer assist level: Minimal Assistance - Patient > 75%     Locomotion Ambulation   Ambulation assist      Assist level: Minimal Assistance - Patient > 75% Assistive device: Walker-rolling Max distance: 12   Walk 10 feet activity   Assist     Assist level:  Minimal Assistance - Patient > 75% Assistive device: Walker-rolling   Walk 50 feet activity   Assist Walk 50 feet with 2 turns activity did not occur: Safety/medical concerns         Walk 150 feet activity   Assist Walk 150 feet activity did not occur: Safety/medical concerns         Walk 10 feet on uneven surface  activity   Assist Walk 10 feet on uneven surfaces activity did not occur: Safety/medical concerns         Wheelchair     Assist Is the patient using a wheelchair?: Yes Type of Wheelchair: Manual    Wheelchair assist level: Contact Guard/Touching assist Max wheelchair distance: 150    Wheelchair 50 feet with 2 turns activity    Assist        Assist Level: Contact Guard/Touching assist   Wheelchair 150 feet activity     Assist      Assist Level: Contact Guard/Touching assist   Blood pressure (!) 122/56, pulse (!) 59, temperature 97.7 F (36.5 C), temperature source Oral, resp. rate 16, height 5' 8 (1.727 m), weight 99.9 kg, SpO2 98%.  Medical Problem List and Plan: 1. 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  Antithrombotics: -DVT/anticoagulation:  Pharmaceutical: Lovenox  initiated 11/29/2024             -antiplatelet therapy: N/A 3. Pain Management/migraine headaches: Lidocaine  patch, Neurontin  400 mg 3 times daily,, Fioricet  as needed, Flexeril  5 mg 3 times daily as needed, oxycodone  5 to 10 mg every 4 hours as needed severe pain.  Continue beta-blocker  -1/24 did not tolerate topamax in past, increase neurontin  dose to 600mg  TID  1/26 pt with severe headache, almost migraine-like with visual aura. Saw Dr. Velinda remotely for headaches   -will try low dose depakote  250mg  bid with imitrex  for  breakthrough    -continue prn fioricet , oxycodone    -might do better with propranolol as opposed to metoprolol  for h/a   -discussed the role her anxiety probably has with pain 4. Mood/Behavior/Sleep: Cymbalta  60 mg twice daily, Xanax  1 mg 3 times daily             -antipsychotic agents: N/A  - 1/25 Xanax  dose decreased yesterday and changed to as needed-reports it made her feel a little loopy.   5. Neuropsych/cognition: This patient is capable of making  decisions on her own behalf. 6. Skin/Wound Care: Routine skin checks 7. Fluids/Electrolytes/Nutrition:  labs reviewed 1/26 8.  Hypertension.  Norvasc  10 mg daily, clonidine  0.2 mg 3 times daily, HCTZ 25 mg daily, Avapro  300 mg daily, Lopressor  50 mg twice daily.  Monitor with increased mobility  - 1/23 Stop hydrochlorothiazide  due to hyponatremia  -1/24-26 BP stable, monitor    12/09/2024    5:05 AM 12/08/2024    7:12 PM 12/08/2024    2:56 PM  Vitals with BMI  Systolic 122 125 867  Diastolic 56 59 66  Pulse 59 66 68    9.  Diabetes mellitus.  Hemoglobin A1c 6.7.  Currently SSI/Lantus  insulin  14 units daily.  Prior to admission patient on Humalog  10-15 units twice daily, Mounjaro  15 mg weekly, Glucophage  1000 mg twice daily.  Patient is followed by endocrinology services Dr. Tommas  - 1/23 controlled continue current regimen  -1/24 - 25 CBGs controlled, continue current regimen  CBG (last 3)  Recent Labs    12/08/24 1642 12/08/24 2026 12/09/24 0537  GLUCAP 120* 192* 127*    10.  CKD stage III.  Creatinine baseline 1.1.  Follow-up chemistries  - 1/23 BUN a little higher at 23, creatinine down to 0.92.  Will check PVRs just to make sure she is not retaining  -1/24 BUN and CR a little higher but overall stable, bladder scans were 0x3- discontinue  -1/26 BUN and creatinine overall stable and 24/1.03 11.  Rheumatoid arthritis.  Chronic immunosuppressants.  Continue Plaquenil  as well as Arava .  Follow-up rheumatology services 12.   Glaucoma.  Recent right eye laser surgery 6 weeks ago.  Follow-up outpatient 13.  Hypothyroidism.  Synthroid  14.  Class I obesity.  BMI 32.72.  Dietary follow-up 15.  GERD.  Protonix  16.  Hyperlipidemia.  Fenofibrate . 17.  History of anal fissures.  Status post anal sphincterotomy 2007.  Follow-up outpatient 18.  Hyponatremia   -1/23 Na 128 this Am, DC hydrochlorothiazide , will recheck tomorrow.  Will do fluid restriction.  Discussed with nephrology Dr. Tobie appreciate assistance  -1/25 sodium still low but overall stable at 129 yesterday 128 today.  Will check urine sodium and osmolality. Consider DC cymbalta . May need renal consult if not improving.   1/26 up to 131 today, urine osmolality and sodium ok. Continue to monitor 19. Hypokalemia  -1/23 20 mEq twice daily ordered, she got the first dose but after speaking with nephrology will increase second dose to 40 mEq because she may be total body depleted  -1/26 potassium stable 4.0 20.  Diarrhea  - Will avoid scheduled laxatives.  Continue to monitor  -LBM 1/23, will hold off on laxatives today as she reports chronic issues with diarrhea and feels like she will have bowel movement soon  LOS: 4 days A FACE TO FACE EVALUATION WAS PERFORMED  Amy Adams 12/09/2024, 9:44 AM     "

## 2024-12-09 NOTE — Plan of Care (Signed)
  Problem: RH BOWEL ELIMINATION Goal: RH STG MANAGE BOWEL W/MEDICATION W/ASSISTANCE Description: STG Manage Bowel with Medication with mod I Assistance. Outcome: Progressing   Problem: RH SAFETY Goal: RH STG ADHERE TO SAFETY PRECAUTIONS W/ASSISTANCE/DEVICE Description: STG Adhere to Safety Precautions With cues Assistance/Device. Outcome: Progressing   Problem: RH PAIN MANAGEMENT Goal: RH STG PAIN MANAGED AT OR BELOW PT'S PAIN GOAL Description: Pain < 4 with prns Outcome: Progressing

## 2024-12-09 NOTE — Progress Notes (Signed)
 Patient ID: Amy Adams, female   DOB: 05-01-1966, 59 y.o.   MRN: [id number] Met with the patient to review current medical situation, rehab process, team conference and plan of care post [facility] with basilar artery vasospasms photosensitivity and HA. Reviewed secondary risk management including HTN, HLD, DM (A1C 6.7) and Hypothyroidism. Reviewed HH/CMM diet modification and medications, seizure prophylaxis and driving precautions post. Patient expressed concern about medications and resumption of work vs disability thru March 31st, 2026. Forwarded concerns to SW.   Continue to follow along to address educational needs to facilitate preparation for discharge. Fredericka Barnie NOVAK

## 2024-12-09 NOTE — Progress Notes (Signed)
 Physical Therapy Note  Patient Details  Name: JOYLEEN HASELTON MRN: [id number] Date of Birth: 07-Jun-1966 Today's Date: 12/09/2024    Due to the   state of emergency, patients may not receive 3.0 hours of therapy services.    Davena Julian 12/09/2024, 11:06 AM

## 2024-12-10 DIAGNOSIS — R519 Headache, unspecified: Secondary | ICD-10-CM

## 2024-12-10 DIAGNOSIS — I609 Nontraumatic subarachnoid hemorrhage, unspecified: Secondary | ICD-10-CM | POA: Diagnosis not present

## 2024-12-10 DIAGNOSIS — R197 Diarrhea, unspecified: Secondary | ICD-10-CM | POA: Diagnosis not present

## 2024-12-10 DIAGNOSIS — E119 Type 2 diabetes mellitus without complications: Secondary | ICD-10-CM

## 2024-12-10 DIAGNOSIS — I10 Essential (primary) hypertension: Secondary | ICD-10-CM

## 2024-12-10 LAB — BASIC METABOLIC PANEL WITH GFR
Anion gap: 13 (ref 5–15)
BUN: 18 mg/dL (ref 6–20)
CO2: 24 mmol/L (ref 22–32)
Calcium: 9.6 mg/dL (ref 8.9–10.3)
Chloride: 96 mmol/L — ABNORMAL LOW (ref 98–111)
Creatinine, Ser: 0.84 mg/dL (ref 0.44–1.00)
GFR, Estimated: >60 mL/min
Glucose, Bld: 124 mg/dL — ABNORMAL HIGH (ref 70–99)
Potassium: 4.1 mmol/L (ref 3.5–5.1)
Sodium: 133 mmol/L — ABNORMAL LOW (ref 135–145)

## 2024-12-10 LAB — GLUCOSE, CAPILLARY
Glucose-Capillary: 131 mg/dL — ABNORMAL HIGH (ref 70–99)
Glucose-Capillary: 123 mg/dL — ABNORMAL HIGH (ref 70–99)
Glucose-Capillary: 146 mg/dL — ABNORMAL HIGH (ref 70–99)
Glucose-Capillary: 165 mg/dL — ABNORMAL HIGH (ref 70–99)

## 2024-12-10 MED ORDER — OXYCODONE HCL 5 MG PO TABS
5.0000 mg | ORAL_TABLET | ORAL | Status: DC | PRN
  Administered 2024-12-10 – 2024-12-12 (×7): 10 mg via ORAL
  Filled 2024-12-10 (×7): qty 2

## 2024-12-10 NOTE — Progress Notes (Addendum)
 "                                                        PROGRESS NOTE   Subjective/Complaints: Continues to have headache this morning although better than yesterday. Continue to have some facial tingling. LBM today.   ROS: Patient denies fever, new vision changes, dizziness, nausea, vomiting, diarrhea,  shortness of breath or chest pain, or mood change.  + headache    Objective:   No results found.  No results for input(s): WBC, HGB, HCT, PLT in the last 72 hours.  Recent Labs    12/09/24 0433 12/10/24 0601  NA 131* 133*  K 4.0 4.1  CL 94* 96*  CO2 29 24  GLUCOSE 118* 124*  BUN 24* 18  CREATININE 1.03* 0.84  CALCIUM  9.0 9.6    Intake/Output Summary (Last 24 hours) at 12/10/2024 1022 Last data filed at 12/09/2024 1810 Gross per 24 hour  Intake 472 ml  Output --  Net 472 ml        Physical Exam: Vital Signs Blood pressure (!) 140/76, pulse 65, temperature 97.6 F (36.4 C), temperature source Oral, resp. rate 18, height 5' 8 (1.727 m), weight 99.9 kg, SpO2 98%.  Constitutional: appears uncomfortable . Vital signs reviewed. Working in gym with therapy HEENT: NCAT, EOMI, oral membranes moist. Wearing sunglasses Neck: supple Cardiovascular: RRR without murmur. No JVD    Respiratory/Chest: CTA Bilaterally without wheezes or rales. Normal effort    GI/Abdomen: BS +, non-tender, non-distended Ext: no clubbing, cyanosis, or edema Psych: pleasant but anxious and talkative  Skin: C/D/I. No apparent lesions.    MSK:      No apparent deformity.       Neuro: Alert and oriented x 4, sensation intact to light touch in all 4 extremities, altered in bilateral face.  Left facial droop, no hypertonia noted.  Moving all 4 extremities to gravity and resistance 4+ to 5/5 limited by pain.  Cognition: AAO to person, place, time and event.   + Cognitive/memory deficits. She is distracted by persistent pain yet again today Language: Fluent, No substitutions or neoglisms.   Mild dysarthria   Insight: Fair insight into current condition.  Mood: Pleasant affect, appropriate mood.  Sensation: Reduced to light touch throughout both sides of her face, not on limbs. Reflexes: 2+ in BL UE and LEs. Negative Hoffman's and babinski signs bilaterally.   CN: Left facial droop, left vision difficulty, bilateral facial numbness/tingling Coordination: No apparent tremors or ataxia, complicated by vision/pain Spasticity: MAS 0 in all extremities.   Strength: Moving all 4 limbs antigravity against resistance, 5- out of 5 throughout.     Assessment/Plan: 1. Functional deficits which require 3+ hours per day of interdisciplinary therapy in a comprehensive inpatient rehab setting. Physiatrist is providing close team supervision and 24 hour management of active medical problems listed below. Physiatrist and rehab team continue to assess barriers to discharge/monitor patient progress toward functional and medical goals  Care Tool:  Bathing    Body parts bathed by patient: Right arm, Left arm, Left upper leg, Right lower leg, Chest, Abdomen, Front perineal area, Buttocks, Right upper leg, Left lower leg, Face         Bathing assist Assist Level: Minimal Assistance - Patient > 75%     Upper Body Dressing/Undressing  Upper body dressing   What is the patient wearing?: Pull over shirt    Upper body assist Assist Level: Supervision/Verbal cueing    Lower Body Dressing/Undressing Lower body dressing      What is the patient wearing?: Underwear/pull up, Pants     Lower body assist Assist for lower body dressing: Contact Guard/Touching assist     Toileting Toileting    Toileting assist Assist for toileting: Contact Guard/Touching assist     Transfers Chair/bed transfer  Transfers assist     Chair/bed transfer assist level: Minimal Assistance - Patient > 75%     Locomotion Ambulation   Ambulation assist      Assist level: Minimal Assistance - Patient  > 75% Assistive device: Walker-rolling Max distance: 12   Walk 10 feet activity   Assist     Assist level: Minimal Assistance - Patient > 75% Assistive device: Walker-rolling   Walk 50 feet activity   Assist Walk 50 feet with 2 turns activity did not occur: Safety/medical concerns         Walk 150 feet activity   Assist Walk 150 feet activity did not occur: Safety/medical concerns         Walk 10 feet on uneven surface  activity   Assist Walk 10 feet on uneven surfaces activity did not occur: Safety/medical concerns         Wheelchair     Assist Is the patient using a wheelchair?: Yes Type of Wheelchair: Manual    Wheelchair assist level: Contact Guard/Touching assist Max wheelchair distance: 150    Wheelchair 50 feet with 2 turns activity    Assist        Assist Level: Contact Guard/Touching assist   Wheelchair 150 feet activity     Assist      Assist Level: Contact Guard/Touching assist   Blood pressure (!) 140/76, pulse 65, temperature 97.6 F (36.4 C), temperature source Oral, resp. rate 18, height 5' 8 (1.727 m), weight 99.9 kg, SpO2 98%.  Medical Problem List and Plan: 1. 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  Antithrombotics: -DVT/anticoagulation:  Pharmaceutical: Lovenox  initiated 11/29/2024             -antiplatelet therapy: N/A 3. Pain Management/migraine headaches: Lidocaine  patch, Neurontin  400 mg 3 times daily,, Fioricet  as needed, Flexeril  5 mg 3 times daily as needed, oxycodone  5 to 10 mg every 4 hours as needed severe pain.  Continue beta-blocker  -1/24 did not tolerate topamax in past, increase neurontin  dose to 600mg  TID  1/26 pt with severe headache, almost migraine-like  with visual aura. Saw Dr. Velinda remotely for headaches   -will try low dose depakote  250mg  bid with imitrex  for breakthrough    -continue prn fioricet , oxycodone    -might do better with propranolol as opposed to metoprolol  for h/a   -discussed the role her anxiety probably has with pain   -1/27 Discussed with neurology Dr. Merrianne, Advised DC of the imitrex  and recommended DC depakote . Likely require opiod for this type of pain. Could consider fentayl 12.5 injection if having exacerbation or fentanyl  patch.  4. Mood/Behavior/Sleep: Cymbalta  60  mg twice daily, Xanax  1 mg 3 times daily             -antipsychotic agents: N/A  - 1/25 Xanax  dose decreased yesterday and changed to as needed-reports it made her feel a little loopy.   5. Neuropsych/cognition: This patient is capable of making decisions on her own behalf. 6. Skin/Wound Care: Routine skin checks 7. Fluids/Electrolytes/Nutrition:  labs reviewed 1/26 8.  Hypertension.  Norvasc  10 mg daily, clonidine  0.2 mg 3 times daily, HCTZ 25 mg daily, Avapro  300 mg daily, Lopressor  50 mg twice daily.  Monitor with increased mobility  - 1/23 Stop hydrochlorothiazide  due to hyponatremia  -1/24-27 BP stable, monitor    12/10/2024    4:25 AM 12/09/2024    8:15 PM 12/09/2024    7:42 PM  Vitals with BMI  Systolic 140 129 870  Diastolic 76 63 63  Pulse 65 69 69    9.  Diabetes mellitus.  Hemoglobin A1c 6.7.  Currently SSI/Lantus  insulin  14 units daily.  Prior to admission patient on Humalog  10-15 units twice daily, Mounjaro  15 mg weekly, Glucophage  1000 mg twice daily.  Patient is followed by endocrinology services Dr. Tommas  - 1/23 controlled continue current regimen  -1/24 - 27 CBGs controlled, continue current regimen  CBG (last 3)  Recent Labs    12/09/24 1650 12/09/24 2035 12/10/24 0548  GLUCAP 142* 134* 131*    10.  CKD stage III.  Creatinine baseline 1.1.  Follow-up chemistries  - 1/23 BUN a little higher at 23, creatinine down to  0.92.  Will check PVRs just to make sure she is not retaining  -1/24 BUN and CR a little higher but overall stable, bladder scans were 0x3- discontinue  -1/27 BUN and creatinine lower today 18/0.84 11.  Rheumatoid arthritis.  Chronic immunosuppressants.  Continue Plaquenil  as well as Arava .  Follow-up rheumatology services 12.  Glaucoma.  Recent right eye laser surgery 6 weeks ago.  Follow-up outpatient 13.  Hypothyroidism.  Synthroid  14.  Class I obesity.  BMI 32.72.  Dietary follow-up 15.  GERD.  Protonix  16.  Hyperlipidemia.  Fenofibrate . 17.  History of anal fissures.  Status post anal sphincterotomy 2007.  Follow-up outpatient 18.  Hyponatremia   -1/23 Na 128 this Am, DC hydrochlorothiazide , will recheck tomorrow.  Will do fluid restriction.  Discussed with nephrology Dr. Tobie appreciate assistance  -1/25 sodium still low but overall stable at 129 yesterday 128 today.  Will check urine sodium and osmolality. Consider DC cymbalta . May need renal consult if not improving.   1/26 up to 131 today, urine osmolality and sodium ok. Continue to monitor  1/27 NA up to 133, improving 19. Hypokalemia  -1/23 20 mEq twice daily ordered, she got the first dose but after speaking with nephrology will increase second dose to 40 mEq because she may be total body depleted  -1/26 potassium stable 4.0 20.  Diarrhea  - Will avoid scheduled laxatives.  Continue to monitor  -LBM 1/23, will hold off on laxatives today as she reports chronic issues with diarrhea and feels like she will have bowel movement soon  1/27 LBM today , continue to monitor     LOS: 5 days A FACE TO FACE EVALUATION WAS PERFORMED  Murray Collier 12/10/2024, 10:22 AM     "

## 2024-12-10 NOTE — Progress Notes (Signed)
 Still complaining of severe headache. Neurologic exam is intact.  She is completely alert with normal cognition, normal speech fluency, expression and comprehension.  No focal weakness.  She has had 2 previous CT arteriograms without demonstration of aneurysm and her pattern of bleeding is perimesencephalic.  Discharge when appropriate from pain control and therapy needs.  I will follow-up with her in the office.

## 2024-12-10 NOTE — Progress Notes (Signed)
 Physical Therapy Session Note  Patient Details  Name: Amy Adams MRN: [id number] Date of Birth: [birth year]  Today's Date: 12/10/2024 PT Individual Time: 0800-0900 PT Individual Time Calculation (min): 60 min   Today's Date: 12/10/2024 PT Individual Time: 8581-8471 PT Individual Time Calculation (min): 70 min   Short Term Goals: Week 1:  PT Short Term Goal 1 (Week 1): Pt will transfer sup to sit w/ mod I. PT Short Term Goal 2 (Week 1): Pt will transfer sit to stand w/ CGA consistently PT Short Term Goal 3 (Week 1): Pt will amb x 30' w/ RW and CGA. PT Short Term Goal 4 (Week 1): PT to assess stairs.  Skilled Therapeutic Interventions/Progress Updates:     Treatment 1: Pt semi-reclined in bed upon arrival. Pt reports 7/10 HA pain and unrated low back pain. Nursing present to administer meds. Pt agreeable to therapy. Rest breaks provided throughout session. Session emphasized functional strengthening, endurance/activity tolerance and balance/coordination with ambulation and stair negotiation. Pt amb ~180 ft from room to main gym RW with CGA and VC for safe use and proximity of RW as well as scanning and looking where she was going. PT adjusted RW to appropriate height. Pt asc/desc 12-6 inch steps B HR with CGA and VC for safety and ++ time. Pt amb ~100 ft including 180 degree turn with R HHA and no LOB, though cadence slowed. Pt practiced amb using wide based QC (pt has one at home) ~100 ft including 180 degree turn with CGA. Pt required redirection to task throughout session. Pt amb back to room using RW same distance/assist as before. Pt returned to semi-reclined in bed, bed alarm on, and all needs in reach at end of session.  Treatment 2: Pt semi-reclined in bed napping upon arrival. Easily roused. Pt reports 8/10 HA pain, premedicated. Agreeable to therapy. Rest breaks provided throughout session. Session emphasized functional strengthening, endurance/activity tolerance,  balance/coordination, and ambulation. Pt amb >300 ft using RW from room to main gym taking detour through day room with CGA and VC for safety with RW (stopping prior to taking hand off to scratch face) and to walk in center of hallway - pt tends to veer toward the R without cueing. Pt negotiated 3-6 inch hurdles using RW with CGA. Pt negotiated hurdles with L and R HHA - more unsteady though no LOB. During L HHA pt tended to have more unsteadiness when leading with R LE. This improved with R HHA. 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 Worked on bending and reaching task with horse shoes from stool level and from floor level. When pt performed from floor level she reported increase in HA intensity. CGA overall for this activity. Pt complained of tightness and soreness in hamstrings. Pt used gait belt to stretch hamstrings/calves in seated position. Pt declined offer of hot pack. PT and pt discussed home environment and fall prevention at her mom's house as she will be staying with her upon DC - reducing clutter, taking up area rugs and bath mats, etc. Pt amb back to room ~160 ft back to room with same assist and before - continued to note veering toward R. Pt returned to supine with S, ice pack placed on neck, bed alarm on, all needs in reach, and nursing present at end of session.  Therapy Documentation Precautions:  Precautions Precautions: Fall Recall of Precautions/Restrictions: Impaired Precaution/Restrictions Comments: SBP<160 Restrictions Weight Bearing Restrictions  Per Provider Order: No  Therapy/Group: Individual Therapy  Comer CHRISTELLA Levora Comer Levora, PT, DPT 12/10/2024, 7:45 AM

## 2024-12-10 NOTE — Progress Notes (Signed)
 Occupational Therapy Session Note  Patient Details  Name: Amy Adams MRN: [id number] Date of Birth: 1965/12/09  Today's Date: 12/10/2024 OT Individual Time: 9067-8951 OT Individual Time Calculation (min): 76 min    Short Term Goals: Week 1:  OT Short Term Goal 1 (Week 1): STG+LTG d/t ELOS  Skilled Therapeutic Interventions/Progress Updates:   Patient agreeable to participate in OT session. Reports 6/10 pain level, premedicated.   Patient participated in skilled OT session focusing on bed mobility in ADL apartment, functional mobility, shower transfers, and UE strengthening. Patient received in room. Required increased time to get upright due to headache and fatigue noted from medication administration. Patient then completed functional mobility to ADL apartment CGA with verbal cues due to patient decreased perception to R side with rubbing into wall noted. Patient able to correct with verbal cueing. Patient then completed bed mobility with SUP A to mod I with OT education on log roll due to chronic lower back pain. Patient then expressed concern with shower transfer. Educated on transfer with RW. Patient then completed functional mobility education to increase dynamic balance and safety. Patient then discussed safety in home, residual deficits, and any sensory concerns. Patient then completed UE strengthening circuit with [name] theraband: Rows 3x15 Bicep curls 3x15 Tricep extension 3x15 Horizontal abduction 3x15.[name]   Patient expressed concern for visual deficits, functional vision test completed. No scanning deficits, ocular movement deficits, or neglect noted. Patient then returned to room via functional mobility SUP all needs in reach, alarm on.     Therapy Documentation Precautions:  Precautions Precautions: Fall Recall of Precautions/Restrictions: Impaired Precaution/Restrictions Comments: SBP<160 Restrictions Weight Bearing Restrictions Per Provider Order:  No  Therapy/Group: Individual Therapy  D'mariea L Jaymari Cromie 12/10/2024, 10:37 AM

## 2024-12-11 ENCOUNTER — Other Ambulatory Visit (HOSPITAL_COMMUNITY): Payer: Self-pay

## 2024-12-11 ENCOUNTER — Telehealth (HOSPITAL_COMMUNITY): Payer: Self-pay | Admitting: Pharmacy Technician

## 2024-12-11 DIAGNOSIS — I609 Nontraumatic subarachnoid hemorrhage, unspecified: Secondary | ICD-10-CM | POA: Diagnosis not present

## 2024-12-11 DIAGNOSIS — R197 Diarrhea, unspecified: Secondary | ICD-10-CM | POA: Diagnosis not present

## 2024-12-11 DIAGNOSIS — R519 Headache, unspecified: Secondary | ICD-10-CM | POA: Diagnosis not present

## 2024-12-11 DIAGNOSIS — E1169 Type 2 diabetes mellitus with other specified complication: Secondary | ICD-10-CM | POA: Diagnosis not present

## 2024-12-11 DIAGNOSIS — Z794 Long term (current) use of insulin: Secondary | ICD-10-CM | POA: Diagnosis not present

## 2024-12-11 DIAGNOSIS — I10 Essential (primary) hypertension: Secondary | ICD-10-CM | POA: Diagnosis not present

## 2024-12-11 LAB — BASIC METABOLIC PANEL WITH GFR
Anion gap: 9 (ref 5–15)
BUN: 22 mg/dL — ABNORMAL HIGH (ref 6–20)
CO2: 26 mmol/L (ref 22–32)
Calcium: 9.1 mg/dL (ref 8.9–10.3)
Chloride: 97 mmol/L — ABNORMAL LOW (ref 98–111)
Creatinine, Ser: 1 mg/dL (ref 0.44–1.00)
GFR, Estimated: >60 mL/min
Glucose, Bld: 159 mg/dL — ABNORMAL HIGH (ref 70–99)
Potassium: 4.2 mmol/L (ref 3.5–5.1)
Sodium: 133 mmol/L — ABNORMAL LOW (ref 135–145)

## 2024-12-11 LAB — GLUCOSE, CAPILLARY
Glucose-Capillary: 154 mg/dL — ABNORMAL HIGH (ref 70–99)
Glucose-Capillary: 144 mg/dL — ABNORMAL HIGH (ref 70–99)
Glucose-Capillary: 161 mg/dL — ABNORMAL HIGH (ref 70–99)
Glucose-Capillary: 154 mg/dL — ABNORMAL HIGH (ref 70–99)

## 2024-12-11 MED ORDER — MOUNJARO 15 MG/0.5ML ~~LOC~~ SOAJ: 15.0000 mg | SUBCUTANEOUS | Status: AC

## 2024-12-11 MED ORDER — TRESIBA FLEXTOUCH 100 UNIT/ML ~~LOC~~ SOPN: 30.0000 [IU] | PEN_INJECTOR | Freq: Every day | SUBCUTANEOUS | Status: AC

## 2024-12-11 MED ORDER — FENTANYL 12 MCG/HR TD PT72
1.0000 | MEDICATED_PATCH | TRANSDERMAL | Status: DC
  Administered 2024-12-11: 1 via TRANSDERMAL
  Filled 2024-12-11: qty 1

## 2024-12-11 MED ORDER — FENTANYL 25 MCG/HR TD PT72: 1.0000 | MEDICATED_PATCH | TRANSDERMAL | Status: DC

## 2024-12-11 NOTE — Progress Notes (Signed)
 Occupational Therapy Session Note  Patient Details  Name: COTINA FREEDMAN MRN: [id number] Date of Birth: 29-Oct-1966  Today's Date: 12/11/2024 OT Individual Time: 1115-1200 OT Individual Time Calculation (min): 45 min    Short Term Goals: Week 1:  OT Short Term Goal 1 (Week 1): STG+LTG d/t ELOS Week 2:     Skilled Therapeutic Interventions/Progress Updates:    1:1 continued discussion about vision and its impact on her mobility and ability to perform ADLs. Continues to demonstrate difficulty with convergence. Pt given some visual exercises to address diplopia including a stationary and moving target, including visual pencil push ups. Encourage to try to perform 2-3 x a day. Also performed functional ambulation with both eyes open with sunglasses donned (still sensitive to light) from her room to the nurse's station using the diamonds on the floor to maintain a straight path. Ambulated to the station with RW with contact guard and then practiced without device with min A; demonstrating shorter step length and more of a posterior lean but able to continue with a straight path.  Pt left resting in the bed with nursing administering meds and call bell at her side.    Therapy Documentation Precautions:  Precautions Precautions: Fall Recall of Precautions/Restrictions: Impaired Precaution/Restrictions Comments: SBP<160 Restrictions Weight Bearing Restrictions Per Provider Order: No  Pain:  1:1 Pt reports ongoing head ache     Therapy/Group: Individual Therapy  Claudene Nest [facility] 12/11/2024, 2:16 PM

## 2024-12-11 NOTE — Progress Notes (Signed)
 After toileting patient, as she was ambulating she lightly brushed the wall. No injury, Dan Angiulli aware. No new orders.

## 2024-12-11 NOTE — Progress Notes (Signed)
 Occupational Therapy Session Note  Patient Details  Name: Amy Adams MRN: [id number] Date of Birth: 1966/05/03  Session 1: Today's Date: 12/11/2024 OT Individual Time: 1000-1100 OT Individual Time Calculation (min): 60 min   Session 2: Today's Date: 12/11/2024 OT Individual Time: 1300-1400 OT Individual Time Calculation (min): 60 min    Short Term Goals: Week 1:  OT Short Term Goal 1 (Week 1): STG+LTG d/t ELOS  Session 1: Skilled Therapeutic Interventions/Progress Updates:   Patient agreeable to participate in OT session. Reports 4/10 pain level. Patient received in bed. Patient informed of D/C date, caregiver education needs. Patient participated in skilled OT session focusing on balance, functional mobility, education. Patient completed functional mobility min A to ortho gym 200+ feet. Patient then completed dynamic balance on air ex pad to increase balance during functional activities. Patient then completed functional balance activities in standing. Patient then completed NuSTEP UBE pace matching of 65 spm on level 4 to increase functional strength and functional activity tolerance. Patient then returned to room via functional mobility all needs in reach alarm on.     Session 2: Skilled Therapeutic Interventions/Progress Updates:   Patient agreeable to participate in OT session. Reports 6/10 pain level, throbbing premedicated. Patient participated in skilled OT session focusing on fine motor control, visual motor, and balance. Patient completed functional mobility to gym with CGA and verbal cues for navigation. Patient then completed fine motor coordination skills with small pegs utilizing bilateral coordination and in hand manipulation for increased visual motor skills to determine root cause of deficit leading to poor navigation. Patient then completed toss and catch activity with good ability to track and catch. Patient then completed cone navigation with and without walker  requiring CGA with RW, CG to min A without RW to navigate cones without incidence of running into the cone. Patient then educated on patient binder with education on nutrition to lower high BP and for other co morbidities. Patient returned to bed all needs in reach.     Therapy Documentation Precautions:  Precautions Precautions: Fall Recall of Precautions/Restrictions: Impaired Precaution/Restrictions Comments: SBP<160 Restrictions Weight Bearing Restrictions Per Provider Order: No  Therapy/Group: Individual Therapy  D'mariea L Ellouise Mcwhirter 12/11/2024, 7:11 AM

## 2024-12-11 NOTE — Telephone Encounter (Signed)
 Patient Product/process Development Scientist completed.    The patient is insured through [employer]. Patient has [employer], may use a copay card, and/or apply for patient assistance if available.    Ran test claim for nimodipine  30 mg and the current 14 day co-pay is $50.00.   This test claim was processed through [facility]- copay amounts may vary at other pharmacies due to pharmacy/plan contracts, or as the patient moves through the different stages of their insurance plan.     Reyes Sharps, CPHT Pharmacy Technician Patient Advocate Specialist Lead [facility] Patient Advocate Team Direct Number: [phone]  Fax: [phone]

## 2024-12-11 NOTE — Progress Notes (Signed)
 Physical Therapy Session Note  Patient Details  Name: [name] MRN: [id number] Date of Birth: [birth year]  Today's Date: 12/11/2024 PT Individual Time: 9154-9070 PT Individual Time Calculation (min): 44 min   Short Term Goals: Week 1:  PT Short Term Goal 1 (Week 1): Pt will transfer sup to sit w/ mod I. PT Short Term Goal 2 (Week 1): Pt will transfer sit to stand w/ CGA consistently PT Short Term Goal 3 (Week 1): Pt will amb x 30' w/ RW and CGA. PT Short Term Goal 4 (Week 1): PT to assess stairs.  Skilled Therapeutic Interventions/Progress Updates:     Pt semi-reclined in bed upon arrival. Pt reports 7/10 HA/migraine pain. Agreeable to therapy. Nursing administered meds at start of session. Session emphasized functional UE/LE strengthening, endurance/activity tolerance, and balance/coordination with ambulation. Pt sat to EOB with S. Pt performed sit to stands throughout session with CGA. Pt amb room to/from day room using RW with CGA and VC to maintain B UE on RW at all times - cued to walk in middle of hallway as pt tends to veer to the R. Pt utilized NuStep for B UE/B LE strengthening, cardiovascular and muscular endurance, UE/LE coordination - 12 min total active time - 0.4 miles, 789 total steps, avg 65 steps per min, avg 1.8 METs. Once back in room, pt returned to sitting EOB with S and left in care of MD and nursing.  Therapy Documentation Precautions:  Precautions Precautions: Fall Recall of Precautions/Restrictions: Impaired Precaution/Restrictions Comments: SBP<160 Restrictions Weight Bearing Restrictions Per Provider Order: No  Therapy/Group: Individual Therapy  Comer CHRISTELLA Levora Comer Levora, PT, DPT 12/11/2024, 8:06 AM

## 2024-12-11 NOTE — Patient Care Conference (Incomplete Revision)
 Inpatient RehabilitationTeam Conference and Plan of Care Update Date: 12/11/2024   Time: 09:44 AM    Patient Name: Amy Adams      Medical Record Number: [id number]  Date of Birth: Aug 06, 1966 Sex: Female         Room/Bed: 4W22C/4W22C-01 Payor Info: Payor: [employer] / Plan: [employer] [place] HMO CONNECT / Product Type: *No Product type* /    Admit Date/Time:  12/05/2024  4:12 PM  Primary Diagnosis:  Subarachnoid hemorrhage [facility])  Hospital Problems: Principal Problem:   Subarachnoid hemorrhage (HCC) Active Problems:   Frequent headaches   Diarrhea   Diabetes mellitus (HCC)   Primary hypertension    Expected Discharge Date: Expected Discharge Date: 12/14/24  Team Members Present: Physician leading conference: Dr. Murray Collier Social Worker Present: Waverly Gentry, LCSW-A Nurse Present: Barnie Ronde, RN PT Present: Izetta Seip, PT OT Present: Michaelyn Seip, OT PPS Coordinator present : Eleanor Colon, SLP     Current Status/Progress Goal Weekly Team Focus  Bowel/Bladder   Pt is continent of both bowel and bladder. LBM on 1/27   Patient will remain continent of both bowel and bladder.   Monitor bowel and bladder patterns to prevent constipation, diarrhea or urinary issues.    Swallow/Nutrition/ Hydration               ADL's   CGA to SUP, increased dynamic balance,   SUP to mod I.   DME BSC, HHOT 1/31, Barrier, pain, headache. self care, mobility, balance.    Mobility   CGA for ambulation and transfers; Sup for bed mob   Sup  Balance/NMR, ambulation, transfers; DME none (has RW and quad cane); FU HH PT; DC 1/31; Barriers - pain/HA/vision    Communication                Safety/Cognition/ Behavioral Observations               Pain   Pain complaints of crushing throbbing headache  8/10. Pt has recieved scheduled Q6 Fioricet , Q4 Nimotop , and Q4 PRN Oxycodone  during this shift.   Pt will be free of pain.   Continue pain medication regimen and monitor for  worsening level of pain.    Skin   Pt skin is clean dry and intact   Pt skin will remain clean dry and intact  Perform skin assessment every shift and reinforce preventive measures such as hourly roundings that include frequent turning and repositioning.      Discharge Planning:      Team Discussion: Patient admitted post [facility] with HA, photosensitivity and hyponatremia, CKD - stable.  Easily distracted and veers to the right when ambulating without a visual field deficit noted.   Patient on target to meet rehab goals: yes, currently needs CGA - supervision for mobility. Goals for discharge set for supervision - mod I overall.  *See Care Plan and progress notes for long and short-term goals.   Revisions to Treatment Plan:  Neurology consulted for neuralgia; facial tingling, Fentanyl  patch recommended   Teaching Needs: Safety, medications, transfers, toileting, etc.   Current Barriers to Discharge: Decreased caregiver support and Home enviroment access/layout  Possible Resolutions to Barriers: Family education HH follow up services DME: [facility]     Medical Summary Current Status: vasospams, HTN, DM2, CKD III, hyponatremia, hypokalemia, diarrhea, headache  Barriers to Discharge: Electrolyte abnormality;Medical stability;Self-care education;Uncontrolled Pain  Barriers to Discharge Comments: vasospams, HTN, DM2, CKD III, hyponatremia, hypokalemia, diarrhea, headache Possible Resolutions to [employer] Focus: adjust meds for  HA, monitor electrolytes, monitor renal function, monitor bowel function   Continued Need for Acute Rehabilitation Level of Care: The patient requires daily medical management by a physician with specialized training in physical medicine and rehabilitation for the following reasons: Direction of a multidisciplinary physical rehabilitation program to maximize functional independence : Yes Medical management of patient stability for increased activity during  participation in an intensive rehabilitation regime.: Yes Analysis of laboratory values and/or radiology reports with any subsequent need for medication adjustment and/or medical intervention. : Yes   I attest that I was present, lead the team conference, and concur with the assessment and plan of the team.   Fredericka Sober B 12/11/2024, 2:16 PM

## 2024-12-11 NOTE — Progress Notes (Signed)
 "                                                        PROGRESS NOTE   Subjective/Complaints: Continues to have HA, reports it get worse she doesn't get medications exactly on schedule.   ROS: Patient denies chills, new vision changes, dizziness, nausea, vomiting, diarrhea,  shortness of breath or chest pain, or mood change.  + headache-continued    Objective:   No results found.  No results for input(s): WBC, HGB, HCT, PLT in the last 72 hours.  Recent Labs    12/10/24 0601 12/11/24 0428  NA 133* 133*  K 4.1 4.2  CL 96* 97*  CO2 24 26  GLUCOSE 124* 159*  BUN 18 22*  CREATININE 0.84 1.00  CALCIUM  9.6 9.1    Intake/Output Summary (Last 24 hours) at 12/11/2024 0945 Last data filed at 12/10/2024 1741 Gross per 24 hour  Intake 472 ml  Output --  Net 472 ml        Physical Exam: Vital Signs Blood pressure 138/70, pulse 63, temperature 98.1 F (36.7 C), temperature source Oral, resp. rate 18, height 5' 8 (1.727 m), weight 99.9 kg, SpO2 98%.  Constitutional: appears uncomfortable . Vital signs reviewed. Sitting in WC in her room HEENT: NCAT, EOMI, oral membranes moist. Wearing sunglasses Neck: supple Cardiovascular: RRR without murmur. No JVD    Respiratory/Chest: CTA Bilaterally without wheezes or rales. Normal effort    GI/Abdomen: BS +, non-tender, non-distended Ext: no clubbing, cyanosis, or edema Psych: pleasant but anxious and talkative  Skin: C/D/I. No apparent lesions.    MSK:      No apparent deformity.       Neuro: Alert and oriented x 4, sensation intact to light touch in all 4 extremities, altered in bilateral face.  Left facial droop, no hypertonia noted.  Moving all 4 extremities to gravity and resistance 4+ to 5/5 limited by pain.  Cognition: AAO to person, place, time and event.   + Cognitive/memory deficits. She is distracted by persistent pain yet again today Language: Fluent, No substitutions or neoglisms.  Mild dysarthria    Insight: Fair insight into current condition.  Mood: Pleasant affect, appropriate mood.  Sensation: Reduced to light touch throughout both sides of her face, not on limbs. Reflexes: 2+ in BL UE and LEs. Negative Hoffman's and babinski signs bilaterally.   CN: Left facial droop, left vision difficulty, bilateral facial numbness/tingling Coordination: No apparent tremors or ataxia, complicated by vision/pain Spasticity: MAS 0 in all extremities.   Strength: Moving all 4 limbs antigravity against resistance, 5- out of 5 throughout.     Prior neuro assessment is c/w today's exam 12/11/2024.   Assessment/Plan: 1. Functional deficits which require 3+ hours per day of interdisciplinary therapy in a comprehensive inpatient rehab setting. Physiatrist is providing close team supervision and 24 hour management of active medical problems listed below. Physiatrist and rehab team continue to assess barriers to discharge/monitor patient progress toward functional and medical goals  Care Tool:  Bathing    Body parts bathed by patient: Right arm, Left arm, Left upper leg, Right lower leg, Chest, Abdomen, Front perineal area, Buttocks, Right upper leg, Left lower leg, Face         Bathing assist Assist Level: Minimal Assistance - Patient > 75%  Upper Body Dressing/Undressing Upper body dressing   What is the patient wearing?: Pull over shirt    Upper body assist Assist Level: Supervision/Verbal cueing    Lower Body Dressing/Undressing Lower body dressing      What is the patient wearing?: Underwear/pull up, Pants     Lower body assist Assist for lower body dressing: Contact Guard/Touching assist     Toileting Toileting    Toileting assist Assist for toileting: Contact Guard/Touching assist     Transfers Chair/bed transfer  Transfers assist     Chair/bed transfer assist level: Contact Guard/Touching assist     Locomotion Ambulation   Ambulation assist      Assist  level: Contact Guard/Touching assist Assistive device: Walker-rolling Max distance: 180   Walk 10 feet activity   Assist     Assist level: Contact Guard/Touching assist Assistive device: Walker-rolling   Walk 50 feet activity   Assist Walk 50 feet with 2 turns activity did not occur: Safety/medical concerns  Assist level: Contact Guard/Touching assist Assistive device: Walker-rolling    Walk 150 feet activity   Assist Walk 150 feet activity did not occur: Safety/medical concerns  Assist level: Contact Guard/Touching assist Assistive device: Walker-rolling    Walk 10 feet on uneven surface  activity   Assist Walk 10 feet on uneven surfaces activity did not occur: Safety/medical concerns         Wheelchair     Assist Is the patient using a wheelchair?: Yes Type of Wheelchair: Manual    Wheelchair assist level: Contact Guard/Touching assist Max wheelchair distance: 150    Wheelchair 50 feet with 2 turns activity    Assist        Assist Level: Contact Guard/Touching assist   Wheelchair 150 feet activity     Assist      Assist Level: Contact Guard/Touching assist   Blood pressure 138/70, pulse 63, temperature 98.1 F (36.7 C), temperature source Oral, resp. rate 18, height 5' 8 (1.727 m), weight 99.9 kg, SpO2 98%.  Medical Problem List and Plan: 1. 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 Formulized individual treatment plan based on medical history, underlying problem and comorbidities.     2.  Antithrombotics: -DVT/anticoagulation:  Pharmaceutical: Lovenox   initiated 11/29/2024             -antiplatelet therapy: N/A 3. Pain Management/migraine headaches: Lidocaine  patch, Neurontin  400 mg 3 times daily,, Fioricet  as needed, Flexeril  5 mg 3 times daily as needed, oxycodone  5 to 10 mg every 4 hours as needed severe pain.  Continue beta-blocker  -1/24 did not tolerate topamax in past, increase neurontin  dose to 600mg  TID  1/26 pt with severe headache, almost migraine-like with visual aura. Saw Dr. Velinda remotely for headaches   -will try low dose depakote  250mg  bid with imitrex  for breakthrough    -continue prn fioricet , oxycodone    -might do better with propranolol as opposed to metoprolol  for h/a   -discussed the role her anxiety probably has with pain   -1/27 Discussed with  neurology Dr. Lindzen, Advised DC of the imitrex  and recommended DC depakote . Likely require opiod for this type of pain. Could consider fentayl 12.5 injection if having exacerbation or fentanyl  patch. 1/28 start fentanyl  patch 12 mcg/hr  4. Mood/Behavior/Sleep: Cymbalta  60 mg twice daily, Xanax  1 mg 3 times daily             -antipsychotic agents: N/A  - 1/25 Xanax  dose decreased yesterday and changed to as needed-reports it made her feel a little loopy.   5. Neuropsych/cognition: This patient is capable of making decisions on her own behalf. 6. Skin/Wound Care: Routine skin checks 7. Fluids/Electrolytes/Nutrition:  labs reviewed 1/26 8.  Hypertension.  Norvasc  10 mg daily, clonidine  0.2 mg 3 times daily, HCTZ 25 mg daily, Avapro  300 mg daily, Lopressor  50 mg twice daily.  Monitor with increased mobility  - 1/23 Stop hydrochlorothiazide  due to hyponatremia  -1/24-28 BP stable, monitor    12/11/2024    4:39 AM 12/10/2024    8:03 PM 12/10/2024    7:40 PM  Vitals with BMI  Systolic 138 118 881  Diastolic 70 69 69  Pulse 63 78 78    9.  Diabetes mellitus.  Hemoglobin A1c 6.7.  Currently SSI/Lantus  insulin  14 units daily.  Prior to admission patient on Humalog  10-15 units  twice daily, Mounjaro  15 mg weekly, Glucophage  1000 mg twice daily.  Patient is followed by endocrinology services Dr. Tommas  - 1/23 controlled continue current regimen  -1/24 - 28 CBGs controlled, continue current regimen  CBG (last 3)  Recent Labs    12/10/24 1642 12/10/24 2129 12/11/24 0634  GLUCAP 146* 165* 154*    10.  CKD stage III.  Creatinine baseline 1.1.  Follow-up chemistries  - 1/23 BUN a little higher at 23, creatinine down to 0.92.  Will check PVRs just to make sure she is not retaining  -1/24 BUN and CR a little higher but overall stable, bladder scans were 0x3- discontinue  -1/28 BUN and Cr Stable at 1.0/22 11.  Rheumatoid arthritis.  Chronic immunosuppressants.  Continue Plaquenil  as well as Arava .  Follow-up rheumatology services 12.  Glaucoma.  Recent right eye laser surgery 6 weeks ago.  Follow-up outpatient 13.  Hypothyroidism.  Synthroid  14.  Class I obesity.  BMI 32.72.  Dietary follow-up 15.  GERD.  Protonix  16.  Hyperlipidemia.  Fenofibrate . 17.  History of anal fissures.  Status post anal sphincterotomy 2007.  Follow-up outpatient 18.  Hyponatremia   -1/23 Na 128 this Am, DC hydrochlorothiazide , will recheck tomorrow.  Will do fluid restriction.  Discussed with nephrology Dr. Tobie appreciate assistance  -1/25 sodium still low but overall stable at 129 yesterday 128 today.  Will check urine sodium and osmolality. Consider DC cymbalta . May need renal consult if not improving.   1/26 up to 131 today, urine osmolality and sodium ok. Continue to monitor  1/27 NA up to 133, improving  1/28 stable at 133 19. Hypokalemia  -1/23 20 mEq twice daily ordered, she got the first dose but after speaking with nephrology will increase second dose to 40 mEq because she may be total body depleted  -1/28 potassium stable 4.2 20.  Diarrhea  - Will avoid scheduled laxatives.  Continue to monitor  -LBM 1/23, will hold off on laxatives today as she reports chronic issues with  diarrhea and feels like she will have bowel movement soon  1/28 LBM yesterday, monitor     LOS: 6 days A FACE TO FACE EVALUATION WAS  PERFORMED  Murray Collier 12/11/2024, 9:45 AM     "

## 2024-12-12 ENCOUNTER — Other Ambulatory Visit (HOSPITAL_COMMUNITY): Payer: Self-pay

## 2024-12-12 ENCOUNTER — Telehealth (HOSPITAL_COMMUNITY): Payer: Self-pay

## 2024-12-12 DIAGNOSIS — I609 Nontraumatic subarachnoid hemorrhage, unspecified: Secondary | ICD-10-CM | POA: Diagnosis not present

## 2024-12-12 DIAGNOSIS — Z794 Long term (current) use of insulin: Secondary | ICD-10-CM | POA: Diagnosis not present

## 2024-12-12 DIAGNOSIS — E1169 Type 2 diabetes mellitus with other specified complication: Secondary | ICD-10-CM | POA: Diagnosis not present

## 2024-12-12 DIAGNOSIS — R519 Headache, unspecified: Secondary | ICD-10-CM | POA: Diagnosis not present

## 2024-12-12 DIAGNOSIS — R197 Diarrhea, unspecified: Secondary | ICD-10-CM | POA: Diagnosis not present

## 2024-12-12 DIAGNOSIS — I10 Essential (primary) hypertension: Secondary | ICD-10-CM | POA: Diagnosis not present

## 2024-12-12 LAB — GLUCOSE, CAPILLARY
Glucose-Capillary: 130 mg/dL — ABNORMAL HIGH (ref 70–99)
Glucose-Capillary: 159 mg/dL — ABNORMAL HIGH (ref 70–99)
Glucose-Capillary: 115 mg/dL — ABNORMAL HIGH (ref 70–99)
Glucose-Capillary: 171 mg/dL — ABNORMAL HIGH (ref 70–99)

## 2024-12-12 MED ORDER — AMLODIPINE BESYLATE 10 MG PO TABS
10.0000 mg | ORAL_TABLET | Freq: Every day | ORAL | 0 refills | Status: AC
  Filled 2024-12-12: qty 30, 30d supply, fill #0

## 2024-12-12 MED ORDER — FENTANYL 12 MCG/HR TD PT72
1.0000 | MEDICATED_PATCH | TRANSDERMAL | 0 refills | Status: DC
  Filled 2024-12-12: qty 5, 15d supply, fill #0

## 2024-12-12 MED ORDER — DULOXETINE HCL 60 MG PO CPEP
60.0000 mg | ORAL_CAPSULE | Freq: Two times a day (BID) | ORAL | 0 refills | Status: AC
  Filled 2024-12-12: qty 60, 30d supply, fill #0

## 2024-12-12 MED ORDER — BUTALBITAL-APAP-CAFFEINE 50-325-40 MG PO TABS
2.0000 | ORAL_TABLET | Freq: Four times a day (QID) | ORAL | 0 refills | Status: AC
  Filled 2024-12-12: qty 60, 10d supply, fill #0

## 2024-12-12 MED ORDER — METOPROLOL TARTRATE 50 MG PO TABS
50.0000 mg | ORAL_TABLET | Freq: Two times a day (BID) | ORAL | 0 refills | Status: AC
  Filled 2024-12-12: qty 60, 30d supply, fill #0

## 2024-12-12 MED ORDER — CLONIDINE HCL 0.2 MG PO TABS
0.2000 mg | ORAL_TABLET | Freq: Three times a day (TID) | ORAL | 0 refills | Status: AC
  Filled 2024-12-12: qty 90, 30d supply, fill #0

## 2024-12-12 MED ORDER — GABAPENTIN 300 MG PO CAPS
600.0000 mg | ORAL_CAPSULE | Freq: Three times a day (TID) | ORAL | 0 refills | Status: AC
  Filled 2024-12-12: qty 180, 30d supply, fill #0

## 2024-12-12 MED ORDER — CYCLOBENZAPRINE HCL 5 MG PO TABS
5.0000 mg | ORAL_TABLET | Freq: Three times a day (TID) | ORAL | 0 refills | Status: AC | PRN
  Filled 2024-12-12: qty 60, 20d supply, fill #0

## 2024-12-12 MED ORDER — VALSARTAN 320 MG PO TABS
320.0000 mg | ORAL_TABLET | Freq: Every day | ORAL | 0 refills | Status: AC
  Filled 2024-12-12: qty 30, 30d supply, fill #0

## 2024-12-12 MED ORDER — NIMODIPINE 30 MG PO CAPS
60.0000 mg | ORAL_CAPSULE | ORAL | 0 refills | Status: AC
  Filled 2024-12-12: qty 60, 5d supply, fill #0

## 2024-12-12 MED ORDER — OXYCODONE HCL 5 MG PO TABS
5.0000 mg | ORAL_TABLET | ORAL | 0 refills | Status: AC | PRN
  Filled 2024-12-12: qty 30, 3d supply, fill #0

## 2024-12-12 MED ORDER — LIDOCAINE 5 % EX PTCH
1.0000 | MEDICATED_PATCH | CUTANEOUS | 0 refills | Status: AC
  Filled 2024-12-12: qty 30, 30d supply, fill #0

## 2024-12-12 MED ORDER — ALPRAZOLAM 0.5 MG PO TABS
0.5000 mg | ORAL_TABLET | Freq: Three times a day (TID) | ORAL | 0 refills | Status: AC | PRN
  Filled 2024-12-12: qty 30, 10d supply, fill #0

## 2024-12-12 MED ORDER — FENOFIBRATE MICRONIZED 134 MG PO CAPS
134.0000 mg | ORAL_CAPSULE | Freq: Every day | ORAL | 0 refills | Status: AC
  Filled 2024-12-12: qty 30, 30d supply, fill #0

## 2024-12-12 NOTE — Progress Notes (Signed)
 Physical Therapy Discharge Summary  Patient Details  Name: Amy Adams MRN: [id number] Date of Birth: Feb 28, 1966  Date of Discharge from PT service:December 13, 2024   Patient has met 10 of 10 long term goals due to improved activity tolerance, improved balance, increased strength, decreased pain, ability to compensate for deficits, and improved coordination.  Patient to discharge at an ambulatory level Supervision.   Patient's care partner is independent to provide the necessary physical assistance at discharge.  Reasons goals not met: n/a - all goals met at this time  Recommendation:  Patient will benefit from ongoing skilled PT services in home health setting to continue to advance safe functional mobility, address ongoing impairments in strength, balance, coordination, and minimize fall risk.  Equipment: No equipment provided (RW at the house)  Reasons for discharge: treatment goals met and discharge from hospital  Patient/family agrees with progress made and goals achieved: Yes  PT Discharge Precautions/Restrictions Precautions Precautions: Fall Recall of Precautions/Restrictions: Impaired Precaution/Restrictions Comments: SBP<160 Restrictions Weight Bearing Restrictions Per Provider Order: No Pain Interference Pain Interference Pain Effect on Sleep: 3. Frequently Pain Interference with Therapy Activities: 1. Rarely or not at all Pain Interference with Day-to-Day Activities: 2. Occasionally Vision/Perception  Vision - History Ability to See in Adequate Light: 1 Impaired Perception Perception: Within Functional Limits Praxis Praxis: WFL  Cognition Overall Cognitive Status: Within Functional Limits for tasks assessed Arousal/Alertness: Awake/alert Memory: Appears intact Awareness: Appears intact Problem Solving: Appears intact Safety/Judgment: Appears intact Sensation Sensation Light Touch: Appears Intact Coordination Gross Motor Movements are Fluid and  Coordinated: Yes Heel Shin Test: [facility] Motor  Motor Motor: Within Functional Limits Motor - Discharge Observations: generalized weakness  Mobility Bed Mobility Rolling Right: Independent Rolling Left: Independent Supine to Sit: Independent Sit to Supine: Independent Transfers Transfers: Stand to Sit;Sit to Stand;Stand Pivot Transfers Sit to Stand: Independent with assistive device Stand to Sit: Independent with assistive device Stand Pivot Transfers: Supervision/Verbal cueing Stand Pivot Transfer Details: Verbal cues for safe use of DME/AE;Verbal cues for technique Transfer (Assistive device): Rolling walker Locomotion  Gait Ambulation: Yes Gait Assistance: Supervision/Verbal cueing Gait Distance (Feet): 200 Feet Assistive device: Rolling walker Gait Assistance Details: Verbal cues for gait pattern;Verbal cues for safe use of DME/AE;Verbal cues for precautions/safety Gait Gait: Yes Gait Pattern: Impaired Gait Pattern: Step-to pattern;Step-through pattern;Trunk flexed;Poor foot clearance - right;Poor foot clearance - left Gait velocity: reduced Stairs / Additional Locomotion Stairs: Yes Stairs Assistance: Supervision/Verbal cueing Stair Management Technique: Two rails Number of Stairs: 12 Height of Stairs: 6 Ramp: Contact Guard/touching assist Curb: Supervision/Verbal cueing Wheelchair Mobility Wheelchair Mobility: No  Trunk/Postural Assessment  Cervical Assessment Cervical Assessment: Exceptions to [facility] (forward head) Thoracic Assessment Thoracic Assessment: Exceptions to [facility] (rounded shoulders) Lumbar Assessment Lumbar Assessment: Exceptions to [facility] (posterior pelvic tilt) Postural Control Postural Control: Within Functional Limits  Balance Balance Balance Assessed: Yes Static Sitting Balance Static Sitting - Balance Support: Feet supported Static Sitting - Level of Assistance: 7: Independent Dynamic Sitting Balance Dynamic Sitting - Balance Support: Feet  supported Dynamic Sitting - Level of Assistance: 7: Independent Static Standing Balance Static Standing - Balance Support: Right upper extremity supported;Left upper extremity supported Static Standing - Level of Assistance: 6: Modified independent (Device/Increase time) Dynamic Standing Balance Dynamic Standing - Balance Support: Right upper extremity supported;Left upper extremity supported Dynamic Standing - Level of Assistance: 5: Stand by assistance Extremity Assessment  RLE Assessment RLE Assessment: Within Functional Limits General Strength Comments: grossly 4/5 LLE Assessment LLE Assessment: Within Functional Limits  General Strength Comments: grossly 4+/5   Comer CHRISTELLA Levora Comer Levora, PT, DPT 12/12/2024, 11:41 AM  Donald WENDI Pereyra, PT, DPT, CBIS 12/13/24

## 2024-12-12 NOTE — Progress Notes (Signed)
 Education for HTN, HLD, DM and stroke prevention was provided and pt demonstrated comprehension.

## 2024-12-12 NOTE — Progress Notes (Signed)
 "                                                        PROGRESS NOTE   Subjective/Complaints: Continues to have headache, she reports usual medications if taken on regimen keep the pain under fair control however this morning she was woken up a little abruptly so is having more headache.  Last night per nursing note she lightly brushed the wall when ambulating-she denies any significant injuries.  ROS: Patient denies fevers, new vision changes, dizziness, nausea, vomiting, diarrhea,  shortness of breath or chest pain, or mood change.  + headache-continued    Objective:   No results found.  No results for input(s): WBC, HGB, HCT, PLT in the last 72 hours.  Recent Labs    12/10/24 0601 12/11/24 0428  NA 133* 133*  K 4.1 4.2  CL 96* 97*  CO2 24 26  GLUCOSE 124* 159*  BUN 18 22*  CREATININE 0.84 1.00  CALCIUM  9.6 9.1    Intake/Output Summary (Last 24 hours) at 12/12/2024 1214 Last data filed at 12/11/2024 2220 Gross per 24 hour  Intake 720 ml  Output --  Net 720 ml        Physical Exam: Vital Signs Blood pressure (!) 137/56, pulse (!) 52, temperature 97.6 F (36.4 C), temperature source Oral, resp. rate 18, height 5' 8 (1.727 m), weight 99.9 kg, SpO2 98%.  Constitutional: appears uncomfortable . Vital signs reviewed.  Working with therapy in the hallway. HEENT: NCAT, EOMI, oral membranes moist. Wearing sunglasses Neck: supple Cardiovascular: RRR without murmur. No JVD    Respiratory/Chest: CTA Bilaterally without wheezes or rales. Normal effort    GI/Abdomen: BS +, non-tender, non-distended Ext: no clubbing, cyanosis, or edema Psych: pleasant but anxious and talkative  Skin: C/D/I. No apparent lesions.    MSK:      No apparent deformity.       Neuro: Alert and oriented x 4, sensation intact to light touch in all 4 extremities, altered in bilateral face.  Left facial droop, no hypertonia noted.  Moving all 4 extremities to gravity and resistance 4+ to  5/5 limited by pain.  Cognition: AAO to person, place, time and event.   + Cognitive/memory deficits. She is distracted by persistent pain yet again today Language: Fluent, No substitutions or neoglisms.  Mild dysarthria   Insight: Fair insight into current condition.  Mood: Pleasant affect, appropriate mood.  Sensation: Reduced to light touch throughout both sides of her face, not on limbs. Reflexes: 2+ in BL UE and LEs. Negative Hoffman's and babinski signs bilaterally.   CN: Left facial droop, left vision difficulty, bilateral facial numbness/tingling Coordination: No apparent tremors or ataxia, complicated by vision/pain Spasticity: MAS 0 in all extremities.   Strength: Moving all 4 limbs antigravity against resistance, 5- out of 5 throughout.     Prior neuro assessment is c/w today's exam 12/12/2024.   Assessment/Plan: 1. Functional deficits which require 3+ hours per day of interdisciplinary therapy in a comprehensive inpatient rehab setting. Physiatrist is providing close team supervision and 24 hour management of active medical problems listed below. Physiatrist and rehab team continue to assess barriers to discharge/monitor patient progress toward functional and medical goals  Care Tool:  Bathing    Body parts bathed by patient: Right arm, Left arm,  Left upper leg, Right lower leg, Chest, Abdomen, Front perineal area, Buttocks, Right upper leg, Left lower leg, Face         Bathing assist Assist Level: Minimal Assistance - Patient > 75%     Upper Body Dressing/Undressing Upper body dressing   What is the patient wearing?: Pull over shirt    Upper body assist Assist Level: Supervision/Verbal cueing    Lower Body Dressing/Undressing Lower body dressing      What is the patient wearing?: Underwear/pull up, Pants     Lower body assist Assist for lower body dressing: Contact Guard/Touching assist     Toileting Toileting    Toileting assist Assist for toileting:  Contact Guard/Touching assist     Transfers Chair/bed transfer  Transfers assist     Chair/bed transfer assist level: Contact Guard/Touching assist     Locomotion Ambulation   Ambulation assist      Assist level: Contact Guard/Touching assist Assistive device: Walker-rolling Max distance: 150+   Walk 10 feet activity   Assist     Assist level: Contact Guard/Touching assist Assistive device: Walker-rolling   Walk 50 feet activity   Assist Walk 50 feet with 2 turns activity did not occur: Safety/medical concerns  Assist level: Contact Guard/Touching assist Assistive device: Walker-rolling    Walk 150 feet activity   Assist Walk 150 feet activity did not occur: Safety/medical concerns  Assist level: Contact Guard/Touching assist Assistive device: Walker-rolling    Walk 10 feet on uneven surface  activity   Assist Walk 10 feet on uneven surfaces activity did not occur: Safety/medical concerns         Wheelchair     Assist Is the patient using a wheelchair?: Yes Type of Wheelchair: Manual    Wheelchair assist level: Contact Guard/Touching assist Max wheelchair distance: 150    Wheelchair 50 feet with 2 turns activity    Assist        Assist Level: Contact Guard/Touching assist   Wheelchair 150 feet activity     Assist      Assist Level: Contact Guard/Touching assist   Blood pressure (!) 137/56, pulse (!) 52, temperature 97.6 F (36.4 C), temperature source Oral, resp. rate 18, height 5' 8 (1.727 m), weight 99.9 kg, SpO2 98%.  Medical Problem List and Plan: 1. 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  Antithrombotics: -DVT/anticoagulation:   Pharmaceutical: Lovenox  initiated 11/29/2024             -antiplatelet therapy: N/A 3. Pain Management/migraine headaches: Lidocaine  patch, Neurontin  400 mg 3 times daily,, Fioricet  as needed, Flexeril  5 mg 3 times daily as needed, oxycodone  5 to 10 mg every 4 hours as needed severe pain.  Continue beta-blocker  -1/24 did not tolerate topamax in past, increase neurontin  dose to 600mg  TID  1/26 pt with severe headache, almost migraine-like with visual aura. Saw Dr. Velinda remotely for headaches   -will try low dose depakote  250mg  bid with imitrex  for breakthrough    -continue prn fioricet , oxycodone    -might do better with propranolol as opposed to metoprolol  for h/a   -  discussed the role her anxiety probably has with pain   -1/27 Discussed with neurology Dr. Lindzen, Advised DC of the imitrex  and recommended DC depakote . Likely require opiod for this type of pain. Could consider fentayl 12.5 injection if having exacerbation or fentanyl  patch. 1/28 start fentanyl  patch 12 mcg/hr  1/29 patient is unsure if fentanyl  is providing significant benefit, will continue for now and monitor 4. Mood/Behavior/Sleep: Cymbalta  60 mg twice daily, Xanax  1 mg 3 times daily             -antipsychotic agents: N/A  - 1/25 Xanax  dose decreased yesterday and changed to as needed-reports it made her feel a little loopy.   5. Neuropsych/cognition: This patient is capable of making decisions on her own behalf. 6. Skin/Wound Care: Routine skin checks 7. Fluids/Electrolytes/Nutrition:  labs reviewed 1/26 8.  Hypertension.  Norvasc  10 mg daily, clonidine  0.2 mg 3 times daily, HCTZ 25 mg daily, Avapro  300 mg daily, Lopressor  50 mg twice daily.  Monitor with increased mobility  - 1/23 Stop hydrochlorothiazide  due to hyponatremia  -1/29 BP stable, heart rate up a little bradycardia if continues consider decreasing Lopressor     12/12/2024    4:20 AM 12/12/2024   12:32 AM 12/11/2024    7:54 PM  Vitals with BMI  Systolic 137  126 124  Diastolic 56 63 61  Pulse 52 55 62    9.  Diabetes mellitus.  Hemoglobin A1c 6.7.  Currently SSI/Lantus  insulin  14 units daily.  Prior to admission patient on Humalog  10-15 units twice daily, Mounjaro  15 mg weekly, Glucophage  1000 mg twice daily.  Patient is followed by endocrinology services Dr. Tommas  - 1/23 controlled continue current regimen  -1/24 - 29 CBGs controlled, continue current regimen  CBG (last 3)  Recent Labs    12/11/24 2028 12/12/24 0545 12/12/24 1132  GLUCAP 154* 130* 159*    10.  CKD stage III.  Creatinine baseline 1.1.  Follow-up chemistries  - 1/23 BUN a little higher at 23, creatinine down to 0.92.  Will check PVRs just to make sure she is not retaining  -1/24 BUN and CR a little higher but overall stable, bladder scans were 0x3- discontinue  -1/28 BUN and Cr Stable at 1.0/22 11.  Rheumatoid arthritis.  Chronic immunosuppressants.  Continue Plaquenil  as well as Arava .  Follow-up rheumatology services 12.  Glaucoma.  Recent right eye laser surgery 6 weeks ago.  Follow-up outpatient 13.  Hypothyroidism.  Synthroid  14.  Class I obesity.  BMI 32.72.  Dietary follow-up 15.  GERD.  Protonix  16.  Hyperlipidemia.  Fenofibrate . 17.  History of anal fissures.  Status post anal sphincterotomy 2007.  Follow-up outpatient 18.  Hyponatremia   -1/23 Na 128 this Am, DC hydrochlorothiazide , will recheck tomorrow.  Will do fluid restriction.  Discussed with nephrology Dr. Tobie appreciate assistance  -1/25 sodium still low but overall stable at 129 yesterday 128 today.  Will check urine sodium and osmolality. Consider DC cymbalta . May need renal consult if not improving.   1/26 up to 131 today, urine osmolality and sodium ok. Continue to monitor  1/27 NA up to 133, improving  1/28 stable at 133 19. Hypokalemia  -1/23 20 mEq twice daily ordered, she got the first dose but after speaking with nephrology will increase second dose to 40 mEq because she may be total body  depleted  -1/28 potassium stable 4.2 20.  Diarrhea  - Will avoid scheduled laxatives.  Continue to monitor  -LBM 1/23, will  hold off on laxatives today as she reports chronic issues with diarrhea and feels like she will have bowel movement soon  1/29 LBM yesterday continue to monitor     LOS: 7 days A FACE TO FACE EVALUATION WAS PERFORMED  Murray Collier 12/12/2024, 12:14 PM     "

## 2024-12-12 NOTE — Progress Notes (Signed)
 Occupational Therapy Session Note  Patient Details  Name: Amy Adams MRN: [id number] Date of Birth: 03/30/1966  Today's Date: 12/12/2024 OT Individual Time: 1402-1505 OT Individual Time Calculation (min): 63 min    Short Term Goals: Week 1:  OT Short Term Goal 1 (Week 1): STG+LTG d/t ELOS  Skilled Therapeutic Interventions/Progress Updates:   Patient presents sleeping in bed, reporting medication for headache makes her sleepy.  Patient agreeable to get up out of bed for shower.  Gathered clothing, walked safely with RW, and supervision.  Patient showered with distant supervision, even explaining that pain medicine patch was not to get wet, and a plan for hair washing.   Patient dressed while seated on shower bench.  Patient needed no physical assistance to dress.  Seated at edge of bed, worked on eye motor exercises.  Patient able to recall instructions from OT session yesterday, and able to progress.  Worked on eye teaming, as well as ocular control in midline.  Working to increase range of vision in which she did not see shadow image over actual image.   Patient asking to take a walk at end of session.  Walked to [employer], sat and rested and returned to room, left in bed with alarm engaged and call bell / personal items in reach.    Therapy Documentation Precautions:  Precautions Precautions: Fall Recall of Precautions/Restrictions: Impaired Precaution/Restrictions Comments: SBP<160 Restrictions Weight Bearing Restrictions Per Provider Order: No Pain:  Headache subsiding with pain medication.  Patient with chronic back pain has pain patch and lidocaine  patch on back  Therapy/Group: Individual Therapy  Adrion Menz M 12/12/2024, 3:13 PM

## 2024-12-12 NOTE — Progress Notes (Signed)
 Physical Therapy Session Note  Patient Details  Name: Amy Adams MRN: [id number] Date of Birth: [birth year]  Today's Date: 12/12/2024 PT Individual Time: 9154-9084 PT Individual Time Calculation (min): 30 min   Short Term Goals: Week 1:  PT Short Term Goal 1 (Week 1): Pt will transfer sup to sit w/ mod I. PT Short Term Goal 2 (Week 1): Pt will transfer sit to stand w/ CGA consistently PT Short Term Goal 3 (Week 1): Pt will amb x 30' w/ RW and CGA. PT Short Term Goal 4 (Week 1): PT to assess stairs.  Skilled Therapeutic Interventions/Progress Updates: Pt presents sitting long sitting in bed and agreeable to therapy, although c/o 10/10 HA down into face.  Pt was pre-medicated.  Pt transfers sit to stand w/ supervision.  Pt amb >150' w/ RW and CGA to close sup.  Pt negotiated through cone obstacle course w/ RW and CGA.  Pt returned to room and returned to sitting EOB w/ bed alarm on and all needs in reach.  Missed time 2/2 scheduling error.     Therapy Documentation Precautions:  Precautions Precautions: Fall Recall of Precautions/Restrictions: Impaired Precaution/Restrictions Comments: SBP<160 Restrictions Weight Bearing Restrictions Per Provider Order: No General: PT Amount of Missed Time (min): 45 Minutes PT Missed Treatment Reason: Other (Comment) (schedule conflict) Vital Signs:   Pain:10/10, but decreased to 7/10 Pain Assessment Pain Scale: 0-10 Pain Score: 10-Worst pain ever Pain Location: Head Pain Intervention(s): Medication (See eMAR)    Therapy/Group: Individual Therapy  Randen Kauth P Kerman Pfost 12/12/2024, 9:20 AM

## 2024-12-12 NOTE — Progress Notes (Signed)
 Physical Therapy Session Note  Patient Details  Name: Amy Adams MRN: [id number] Date of Birth: Aug 23, 1966  Today's Date: 12/12/2024 PT Individual Time: 1000-1100 PT Individual Time Calculation (min): 60 min   Short Term Goals: Week 1:  PT Short Term Goal 1 (Week 1): Pt will transfer sup to sit w/ mod I. PT Short Term Goal 2 (Week 1): Pt will transfer sit to stand w/ CGA consistently PT Short Term Goal 3 (Week 1): Pt will amb x 30' w/ RW and CGA. PT Short Term Goal 4 (Week 1): PT to assess stairs.  Skilled Therapeutic Interventions/Progress Updates:     Pt semi-reclined napping in bed upon arrival. Easily roused. Pt reports 3/10 HA pain at rest. Agreeable to therapy. Session emphasized functional strengthening, endurance/activity tolerance, and balance/coordination with ambulation, transfers, and stair negotiation. Pt performed bed mobility with mod I. Pt performed sit to stands to RW throughout session with mod I. Pt amb >200 ft from room to ortho gym using RW with S and VC for remain in center of hallway. Pt performed simulated car transfer to standard height with S. Pt then practiced simulated car transfer to truck level height using 6 inch step with min A as she thinks she will be going home in her son's F-250. Pt asc/desc 12-6 inch steps using B HR with S. Pt states her mom is coming to stay overnight in order to be present during therapy sessions tomorrow for edu/training. Pt amb back to room with same assist as before. Pt requested to use bathroom - pt sat to toilet with S. Pt instructed to pull call cord before getting up - PT notified NT that pt is on toilet.  Therapy Documentation Precautions:  Precautions Precautions: Fall Recall of Precautions/Restrictions: Impaired Precaution/Restrictions Comments: SBP<160 Restrictions Weight Bearing Restrictions Per Provider Order: No  Therapy/Group: Individual Therapy  Comer CHRISTELLA Levora Comer Levora, PT, DPT 12/12/2024, 7:47 AM

## 2024-12-13 ENCOUNTER — Other Ambulatory Visit (HOSPITAL_COMMUNITY): Payer: Self-pay

## 2024-12-13 LAB — GLUCOSE, CAPILLARY
Glucose-Capillary: 138 mg/dL — ABNORMAL HIGH (ref 70–99)
Glucose-Capillary: 127 mg/dL — ABNORMAL HIGH (ref 70–99)

## 2024-12-13 MED ORDER — CARBAMIDE PEROXIDE 6.5 % OT SOLN
5.0000 [drp] | Freq: Two times a day (BID) | OTIC | Status: DC
  Administered 2024-12-13: 5 [drp] via OTIC
  Filled 2024-12-13: qty 15

## 2024-12-13 MED ORDER — CARBAMIDE PEROXIDE 6.5 % OT SOLN
5.0000 [drp] | Freq: Two times a day (BID) | OTIC | 0 refills | Status: AC
  Filled 2024-12-13: qty 15, 30d supply, fill #0

## 2024-12-13 MED ORDER — FENTANYL 12 MCG/HR TD PT72: 1.0000 | MEDICATED_PATCH | TRANSDERMAL | 0 refills | Status: AC

## 2024-12-13 NOTE — Plan of Care (Signed)
°  Problem: RH Balance Goal: LTG: Patient will maintain dynamic sitting balance (OT) Description: LTG:  Patient will maintain dynamic sitting balance with assistance during activities of daily living (OT) Outcome: Completed/Met Goal: LTG Patient will maintain dynamic standing with ADLs (OT) Description: LTG:  Patient will maintain dynamic standing balance with assist during activities of daily living (OT)  Outcome: Completed/Met   Problem: Sit to Stand Goal: LTG:  Patient will perform sit to stand in prep for activites of daily living with assistance level (OT) Description: LTG:  Patient will perform sit to stand in prep for activites of daily living with assistance level (OT) Outcome: Completed/Met   Problem: RH Bathing Goal: LTG Patient will bathe all body parts with assist levels (OT) Description: LTG: Patient will bathe all body parts with assist levels (OT) Outcome: Completed/Met   Problem: RH Dressing Goal: LTG Patient will perform upper body dressing (OT) Description: LTG Patient will perform upper body dressing with assist, with/without cues (OT). Outcome: Completed/Met Goal: LTG Patient will perform lower body dressing w/assist (OT) Description: LTG: Patient will perform lower body dressing with assist, with/without cues in positioning using equipment (OT) Outcome: Completed/Met   Problem: RH Toileting Goal: LTG Patient will perform toileting task (3/3 steps) with assistance level (OT) Description: LTG: Patient will perform toileting task (3/3 steps) with assistance level (OT)  Outcome: Completed/Met   Problem: RH Light Housekeeping Goal: LTG Patient will perform light housekeeping w/assist (OT) Description: LTG: Patient will perform light housekeeping with assistance, with/without cues (OT). Outcome: Completed/Met   Problem: RH Toilet Transfers Goal: LTG Patient will perform toilet transfers w/assist (OT) Description: LTG: Patient will perform toilet transfers with  assist, with/without cues using equipment (OT) Outcome: Completed/Met   Problem: RH Tub/Shower Transfers Goal: LTG Patient will perform tub/shower transfers w/assist (OT) Description: LTG: Patient will perform tub/shower transfers with assist, with/without cues using equipment (OT) Outcome: Completed/Met

## 2024-12-13 NOTE — Progress Notes (Signed)
 "                                                        PROGRESS NOTE   Subjective/Complaints: Patient has been feeling better today in regards to her headache, reports getting medications and regular schedule from nursing has helped.  She would like to go home today, concerned about upcoming snowstorm.  ROS: Patient denies fevers, new vision changes, dizziness, nausea, vomiting, diarrhea,  shortness of breath or chest pain, or mood change.  + headache-improved    Objective:   No results found.  No results for input(s): WBC, HGB, HCT, PLT in the last 72 hours.  Recent Labs    12/11/24 0428  NA 133*  K 4.2  CL 97*  CO2 26  GLUCOSE 159*  BUN 22*  CREATININE 1.00  CALCIUM  9.1    Intake/Output Summary (Last 24 hours) at 12/13/2024 1430 Last data filed at 12/13/2024 0800 Gross per 24 hour  Intake 476 ml  Output --  Net 476 ml        Physical Exam: Vital Signs Blood pressure (!) 149/68, pulse 78, temperature 97.8 F (36.6 C), temperature source Oral, resp. rate 18, height 5' 8 (1.727 m), weight 99.9 kg, SpO2 100%.  Constitutional: appears uncomfortable . Vital signs reviewed.  Working with therapy in the gym.  Appears comfortable HEENT: NCAT, EOMI, oral membranes moist. Neck: supple Cardiovascular: RRR without murmur. No JVD    Respiratory/Chest: CTA Bilaterally without wheezes or rales. Normal effort    GI/Abdomen: BS +, non-tender, non-distended Ext: no clubbing, cyanosis, or edema Psych: pleasant but anxious and talkative  Skin: C/D/I. No apparent lesions.    MSK:      No apparent deformity.       Neuro: Alert and oriented x 4, sensation intact to light touch in all 4 extremities, altered in bilateral face.  Left facial droop, no hypertonia noted.  Moving all 4 extremities to gravity and resistance 4+ to 5/5 limited by pain.  Cognition: AAO to person, place, time and event.   + Cognitive/memory deficits. She is distracted by persistent pain yet again  today Language: Fluent, No substitutions or neoglisms.  Mild dysarthria   Insight: Fair insight into current condition.  Mood: Pleasant affect, appropriate mood.  Sensation: Reduced to light touch throughout both sides of her face, not on limbs. Reflexes: 2+ in BL UE and LEs. Negative Hoffman's and babinski signs bilaterally.   CN: Left facial droop, left vision difficulty, bilateral facial numbness/tingling Coordination: No apparent tremors or ataxia, complicated by vision/pain Spasticity: MAS 0 in all extremities.   Strength: Moving all 4 limbs antigravity against resistance, 5- out of 5 throughout.     Prior neuro assessment is c/w today's exam 12/13/2024.   Assessment/Plan: 1. Functional deficits which require 3+ hours per day of interdisciplinary therapy in a comprehensive inpatient rehab setting. Physiatrist is providing close team supervision and 24 hour management of active medical problems listed below. Physiatrist and rehab team continue to assess barriers to discharge/monitor patient progress toward functional and medical goals  Care Tool:  Bathing    Body parts bathed by patient: Right arm, Left arm, Left upper leg, Right lower leg, Chest, Abdomen, Front perineal area, Buttocks, Right upper leg, Left lower leg, Face  Bathing assist Assist Level: Set up assist     Upper Body Dressing/Undressing Upper body dressing   What is the patient wearing?: Pull over shirt    Upper body assist Assist Level: Independent with assistive device    Lower Body Dressing/Undressing Lower body dressing      What is the patient wearing?: Underwear/pull up, Pants     Lower body assist Assist for lower body dressing: Independent with assitive device     Toileting Toileting    Toileting assist Assist for toileting: Supervision/Verbal cueing     Transfers Chair/bed transfer  Transfers assist     Chair/bed transfer assist level: Supervision/Verbal cueing      Locomotion Ambulation   Ambulation assist      Assist level: Supervision/Verbal cueing Assistive device: Walker-rolling Max distance: >200 ft   Walk 10 feet activity   Assist     Assist level: Supervision/Verbal cueing Assistive device: Walker-rolling   Walk 50 feet activity   Assist Walk 50 feet with 2 turns activity did not occur: Safety/medical concerns  Assist level: Supervision/Verbal cueing Assistive device: Walker-rolling    Walk 150 feet activity   Assist Walk 150 feet activity did not occur: Safety/medical concerns  Assist level: Supervision/Verbal cueing Assistive device: Walker-rolling    Walk 10 feet on uneven surface  activity   Assist Walk 10 feet on uneven surfaces activity did not occur: Safety/medical concerns   Assist level: Contact Guard/Touching assist Assistive device: Walker-rolling   Wheelchair     Assist Is the patient using a wheelchair?: No Type of Wheelchair: Manual    Wheelchair assist level: Contact Guard/Touching assist Max wheelchair distance: 150    Wheelchair 50 feet with 2 turns activity    Assist        Assist Level: Contact Guard/Touching assist   Wheelchair 150 feet activity     Assist      Assist Level: Contact Guard/Touching assist   Blood pressure (!) 149/68, pulse 78, temperature 97.8 F (36.6 C), temperature source Oral, resp. rate 18, height 5' 8 (1.727 m), weight 99.9 kg, SpO2 100%.  Medical Problem List and Plan: 1. 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  Antithrombotics: -DVT/anticoagulation:   Pharmaceutical: Lovenox  initiated 11/29/2024             -antiplatelet therapy: N/A 3. Pain Management/migraine headaches: Lidocaine  patch, Neurontin  400 mg 3 times daily,, Fioricet  as needed, Flexeril  5 mg 3 times daily as needed, oxycodone  5 to 10 mg every 4 hours as needed severe pain.  Continue beta-blocker  -1/24 did not tolerate topamax in past, increase neurontin  dose to 600mg  TID  1/26 pt with severe headache, almost migraine-like with visual aura. Saw Dr. Velinda remotely for headaches   -will try low dose depakote  250mg  bid with imitrex  for breakthrough    -continue prn fioricet , oxycodone    -might do better with propranolol as opposed to metoprolol  for h/a   -discussed the role her anxiety probably has with pain   -  1/27 Discussed with neurology Dr. Lindzen, Advised DC of the imitrex  and recommended DC depakote . Likely require opiod for this type of pain. Could consider fentayl 12.5 injection if having exacerbation or fentanyl  patch. 1/28 start fentanyl  patch 12 mcg/hr  1/29 patient is unsure if fentanyl  is providing significant benefit, will continue for now and monitor 1/30 headache is doing better, continue current regimen 4. Mood/Behavior/Sleep: Cymbalta  60 mg twice daily, Xanax  1 mg 3 times daily             -antipsychotic agents: N/A  - 1/25 Xanax  dose decreased yesterday and changed to as needed-reports it made her feel a little loopy.   5. Neuropsych/cognition: This patient is capable of making decisions on her own behalf. 6. Skin/Wound Care: Routine skin checks 7. Fluids/Electrolytes/Nutrition:  labs reviewed 1/26 8.  Hypertension.  Norvasc  10 mg daily, clonidine  0.2 mg 3 times daily, HCTZ 25 mg daily, Avapro  300 mg daily, Lopressor  50 mg twice daily.  Monitor with increased mobility  - 1/23 Stop hydrochlorothiazide  due to hyponatremia  -1/29 BP stable, heart rate up a little bradycardia if continues consider decreasing Lopressor   1/30 slightly labile but overall stable,  continue current regimen    12/13/2024    9:30 AM 12/13/2024    4:18 AM 12/12/2024    8:33 PM  Vitals with BMI  Systolic 149 119 842  Diastolic 68 63 67  Pulse 78 59 70    9.  Diabetes mellitus.  Hemoglobin A1c 6.7.  Currently SSI/Lantus  insulin  14 units daily.  Prior to admission patient on Humalog  10-15 units twice daily, Mounjaro  15 mg weekly, Glucophage  1000 mg twice daily.  Patient is followed by endocrinology services Dr. Tommas  - 1/23 controlled continue current regimen  -1/24 - 30 CBGs controlled, continue current regimen  CBG (last 3)  Recent Labs    12/12/24 2059 12/13/24 0612 12/13/24 1152  GLUCAP 171* 138* 127*    10.  CKD stage III.  Creatinine baseline 1.1.  Follow-up chemistries  - 1/23 BUN a little higher at 23, creatinine down to 0.92.  Will check PVRs just to make sure she is not retaining  -1/24 BUN and CR a little higher but overall stable, bladder scans were 0x3- discontinue  -1/28 BUN and Cr Stable at 1.0/22 11.  Rheumatoid arthritis.  Chronic immunosuppressants.  Continue Plaquenil  as well as Arava .  Follow-up rheumatology services 12.  Glaucoma.  Recent right eye laser surgery 6 weeks ago.  Follow-up outpatient 13.  Hypothyroidism.  Synthroid  14.  Class I obesity.  BMI 32.72.  Dietary follow-up 15.  GERD.  Protonix  16.  Hyperlipidemia.  Fenofibrate . 17.  History of anal fissures.  Status post anal sphincterotomy 2007.  Follow-up outpatient 18.  Hyponatremia   -1/23 Na 128 this Am, DC hydrochlorothiazide , will recheck tomorrow.  Will do fluid restriction.  Discussed with nephrology Dr. Tobie appreciate assistance  -1/25 sodium still low but overall stable at 129 yesterday 128 today.  Will check urine sodium and osmolality. Consider DC cymbalta . May need renal consult if not improving.   1/26 up to 131 today, urine osmolality and sodium ok. Continue to monitor  1/27 NA up to 133, improving  1/28 stable at 133  - Recheck with PCP in about a week to ensure it  stable  19. Hypokalemia  -1/23 20 mEq twice daily ordered, she got the first dose but after speaking with nephrology will increase second dose to 40 mEq because she may be total body depleted  -  1/28 potassium stable 4.2  - Recheck with PCP in about a week to ensure it stable- discussed with pt  20.  Diarrhea  - Will avoid scheduled laxatives.  Continue to monitor  -LBM 1/23, will hold off on laxatives today as she reports chronic issues with diarrhea and feels like she will have bowel movement soon  1/30 LBM today overall improved     LOS: 8 days A FACE TO FACE EVALUATION WAS PERFORMED  Murray Collier 12/13/2024, 2:30 PM     "

## 2024-12-13 NOTE — Progress Notes (Signed)
 Physical Therapy Session Note  Patient Details  Name: MA MUNOZ MRN: [id number] Date of Birth: 11-16-1965  Today's Date: 12/13/2024 PT Individual Time: 1053-1201 PT Individual Time Calculation (min): 68 min   Short Term Goals: Week 1:  PT Short Term Goal 1 (Week 1): Pt will transfer sup to sit w/ mod I. PT Short Term Goal 2 (Week 1): Pt will transfer sit to stand w/ CGA consistently PT Short Term Goal 3 (Week 1): Pt will amb x 30' w/ RW and CGA. PT Short Term Goal 4 (Week 1): PT to assess stairs.  Skilled Therapeutic Interventions/Progress Updates:    Handoff from PA in room. Pt and family present with plan to d/c after this session - PA had just reviewed d/c instructions with pt and family. Discussed plan for session and goals for family education - pt and family request to practice stepping over shower ledge simulator, stair negotiation, simulated car transfer, and had questions about nutrition/meals. Reviewed education binder with family and tagged resources related to nutrition/meals and all very appreciated of information. Pt stood up with RW and started to walk towards the door when reporting tingling in L side of lips/face and LUE - returned back to sitting on EOB. Pt reports sensation in L forearm decreased. Looked and assessed pt - no increased facial droop (already had residual on L), no new weakness in any extremities, speech within her current normal - pt reports this has happened once before but just in her lips. She states that normally after her medications, she lays down and sleeps for about 30 min which did not happen today. Notified RN who also came to assess, symptoms were resolving and discussed calling pharmacy about any side effects. PA also notified and assessed patient later on in session. Symptoms did not return after this initial occurrence.  Continued with therapy session and pt performed gait on unit with RW at S level > 200' with focus on overall safety and  balance. Step over shower ledge simulated with RW and demo to family with pt performing at S and instructing her family in process and how and what to assist with. Simulated car transfer to lower SUV height at S level - initially stepping into the car but educated on backing up to seat and pt felt more comfortable with this technique. Since she is leaving today, she would be using the lower SUV and not the truck she originally anticipated needing to practice. Stair negotiation with B rails at supervision level x 24 steps with education with daughter on safe guarding/positioning.   Stopped at nutrition bulletin board on the way back to room, and handouts provided for nutrition education as well. Also issued information for [employer] community yoga program  - pt reports that she used to do yoga and really enjoys it. Excited about this opportunity as well in future.   Pt and family deny any further questions or concerns and nursing notified pt ready for d/c.   Therapy Documentation Precautions:  Precautions Precautions: Fall Recall of Precautions/Restrictions: Impaired Precaution/Restrictions Comments: SBP<160 Restrictions Weight Bearing Restrictions Per Provider Order: No    Pain: Denies pain.    Therapy/Group: Individual Therapy  Elnor Pizza Sherrell Pizza WENDI Elnor, PT, DPT, CBIS  12/13/2024, 2:33 PM

## 2024-12-13 NOTE — Progress Notes (Signed)
 Inpatient Rehabilitation Care Coordinator Discharge Note   Patient Details  Name: Amy Adams MRN: [id number] Date of Birth: 1966/07/09   Discharge location: Home with mother  Length of Stay: 7 days  Discharge activity level: Supervision/Verbal cueing  Home/community participation: Active in the community  Patient response un:Yzjouy Literacy - How often do you need to have someone help you when you read instructions, pamphlets, or other written material from your doctor or pharmacy?: Never  Patient response un:Dnrpjo Isolation - How often do you feel lonely or isolated from those around you?: Never  Services provided included: MD, RD, PT, SLP, OT, RN, CM, TR, Pharmacy, SW  Financial Services:  Field Seismologist Utilized: Chartered Loss Adjuster / [employer] [place] [employer]  Choices offered to/list presented to: Patient  Follow-up services arranged:  Outpatient    Outpatient Servicies: OP PT at [facility] in [place]      Patient response to transportation need: Is the patient able to respond to transportation needs?: Yes In the past 12 months, has lack of transportation kept you from medical appointments or from getting medications?: No In the past 12 months, has lack of transportation kept you from meetings, work, or from getting things needed for daily living?: No   Patient/Family verbalized understanding of follow-up arrangements:  Yes  Individual responsible for coordination of the follow-up plan: Patient  Confirmed correct DME delivered: Amy  Adams 12/13/2024    Comments (or additional information): Patient will discharge today back with her mother providing 24/7 care and support.   Summary of Stay    Date/Time Discharge Planning CSW  12/11/24 1525 Plans to discharge home with her mother providing 24/7 care and support. Needs 3-1 commode for DME. Therapy recommending HH PT/OT. DS       Amy  Adams

## 2024-12-13 NOTE — Progress Notes (Signed)
 Occupational Therapy Session Note  Patient Details  Name: AISIA CORREIRA MRN: [id number] Date of Birth: 1966-06-04  Today's Date: 12/13/2024 OT Individual Time: 9181-9068 OT Individual Time Calculation (min): 73 min    Short Term Goals: Week 1:  OT Short Term Goal 1 (Week 1): STG+LTG d/t ELOS  Skilled Therapeutic Interventions/Progress Updates:  Completed family training with patient mother. Therapist educated family regarding showering, functional mobility, self care, fall management, visual deficits, safety, when to call for assistance. . Education also provided on strategies and compensatory techniques to use if patient will to have fall, run into wall. Education provided for gait belt use and handling technique. Patient mother completed hands on training for functional mobility chair transfer with therapist providing VC as needed for technique and form. All education completed and all questions answered. Received in room with mother. Completed functional mobility to gym. Completed education on all topics related to D/C. Discussed with PA, MD, social worker earlier discharge for today. Patient returned to room with all needs in reach handed off to nursing.    Therapy Documentation Precautions:  Precautions Precautions: Fall Recall of Precautions/Restrictions: Impaired Precaution/Restrictions Comments: SBP<160 Restrictions Weight Bearing Restrictions Per Provider Order: No Therapy/Group: Individual Therapy  D'mariea L Jacqueline Delapena 12/13/2024, 7:58 AM

## 2024-12-13 NOTE — Progress Notes (Signed)
 Patient discharged home with family members. Education complete. No questions or concerns at this time

## 2024-12-13 NOTE — Progress Notes (Addendum)
 Patient ID: Amy Adams, female   DOB: 1966/03/15, 59 y.o.   MRN: [id number]  Patient will discharge today 01/30 instead of 01/31. She will participate in OP PT at [facility] in [place]. Has all recommended DME in the home.

## 2024-12-13 NOTE — Progress Notes (Signed)
 Occupational Therapy Discharge Summary  Patient Details  Name: Amy Adams MRN: [id number] Date of Birth: 03-Dec-1965  Date of Discharge from OT service:December 13, 2024  Patient has met 10 of 10 long term goals due to improved activity tolerance, improved balance, postural control, ability to compensate for deficits, improved attention, improved awareness, and improved coordination.  Patient to discharge at overall Supervision level.  Patient's care partner is independent to provide the necessary physical assistance at discharge.    Reasons goals not met: n/a  Recommendation:  No OT need   Equipment: No equipment provided  Reasons for discharge: treatment goals met and discharge from hospital  Patient/family agrees with progress made and goals achieved: Yes  OT Discharge Precautions/Restrictions  Precautions Precautions: Fall Recall of Precautions/Restrictions: Impaired Precaution/Restrictions Comments: SBP<160 Restrictions Weight Bearing Restrictions Per Provider Order: No General   Vital Signs   Pain Pain Assessment Pain Scale: 0-10 Pain Score: 4  ADL ADL Equipment Provided: Reacher Eating: Modified independent Where Assessed-Eating: Bed level Grooming: Modified independent Where Assessed-Grooming: Sitting at sink Upper Body Bathing: Modified independent Where Assessed-Upper Body Bathing: Shower Lower Body Bathing: Supervision/safety Where Assessed-Lower Body Bathing: Shower Upper Body Dressing: Modified independent (Device) Where Assessed-Upper Body Dressing: Edge of bed Lower Body Dressing: Modified independent Where Assessed-Lower Body Dressing: Sitting at sink Toileting: Supervision/safety Where Assessed-Toileting: Teacher, Adult Education: Close supervision Statistician Method: Proofreader: Chiropractor Transfer: Distant supervision Film/video Editor: Distant supervision Film/video Editor Method:  Designer, Industrial/product: Information systems manager with back Vision Baseline Vision/History:  (sungasses due photophobia) Patient Visual Report: Blurring of vision;Eye fatigue/eye pain/headache Vision Assessment?: Wears glasses for reading;Yes Eye Alignment: Within Functional Limits Ocular Range of Motion: Within Functional Limits Diplopia Assessment: Present in far gaze;Present in near gaze;Objects split side to side Perception  Perception: Within Functional Limits Praxis Praxis: WFL Cognition Cognition Overall Cognitive Status: Within Functional Limits for tasks assessed Arousal/Alertness: Awake/alert Memory: Appears intact Attention: Sustained;Selective Sustained Attention: Appears intact Selective Attention: Appears intact Awareness: Appears intact Problem Solving: Appears intact Brief Interview for Mental Status (BIMS) Repetition of Three Words (First Attempt): 3 Temporal Orientation: Year: Correct Temporal Orientation: Month: Accurate within 5 days Temporal Orientation: Day: Correct Recall: Sock: Yes, no cue required Recall: Blue: Yes, no cue required Recall: Bed: Yes, no cue required BIMS Summary Score: 15 Sensation Sensation Light Touch: Appears Intact Coordination Gross Motor Movements are Fluid and Coordinated: Yes Fine Motor Movements are Fluid and Coordinated: Yes Finger Nose Finger Test: [facility] Motor  Motor Motor: Within Functional Limits Motor - Skilled Clinical Observations: weakness R > L. Motor - Discharge Observations: generalized weakness R LE > L LE Mobility  Bed Mobility Bed Mobility: Rolling Right;Rolling Left;Supine to Sit;Sit to Supine Rolling Right: Independent Rolling Left: Independent Supine to Sit: Independent Sit to Supine: Independent Transfers Sit to Stand: Independent with assistive device Stand to Sit: Independent with assistive device  Trunk/Postural Assessment  Cervical Assessment Cervical Assessment: Exceptions to  [facility] Thoracic Assessment Thoracic Assessment: Exceptions to [facility] Lumbar Assessment Lumbar Assessment: Exceptions to [facility] Postural Control Postural Control: Within Functional Limits  Balance Balance Balance Assessed: Yes Static Sitting Balance Static Sitting - Balance Support: Feet supported Static Sitting - Level of Assistance: 7: Independent Dynamic Sitting Balance Dynamic Sitting - Balance Support: Feet supported Dynamic Sitting - Level of Assistance: 7: Independent Dynamic Sitting - Balance Activities: Lateral lean/weight shifting Sitting balance - Comments: supervision at EOB Static Standing Balance Static Standing - Balance Support: Right upper  extremity supported;Left upper extremity supported Static Standing - Level of Assistance: 6: Modified independent (Device/Increase time) Dynamic Standing Balance Dynamic Standing - Balance Support: Right upper extremity supported;Left upper extremity supported Dynamic Standing - Level of Assistance: 5: Stand by assistance Extremity/Trunk Assessment RUE Assessment RUE Assessment: Within Functional Limits LUE Assessment LUE Assessment: Within Functional Limits   Amy Adams 12/13/2024, 9:14 AM

## 2024-12-13 NOTE — Progress Notes (Signed)
 Inpatient Rehabilitation Discharge Medication Review by a Pharmacist  A complete drug regimen review was completed for this patient to identify any potential clinically significant medication issues.  High Risk Drug Classes Is patient taking? Indication by Medication  Antipsychotic No   Anticoagulant No   Antibiotic Yes Valtrex  PRN - herpes outbreak  Opioid Yes Fentanyl  patch/oxycodone  - pain  Antiplatelet No   Hypoglycemics/insulin  Yes Insulin  aspart, Mounjaro , tresiba  - DM  Vasoactive Medication Yes Amlodipine , Clonidine , metoprolol , valsartan  - HTN Nimodipine  - SAH  Chemotherapy No   Other Yes Tylenol , Lidoderm  and gabapentin  - pain Xanax  - anxiety Flexeril  - muscle spasms Cymbalta  - MDD Fenofibrate  - HLD Prilosec - GERD Synthroid  - hyperthyroidism Arava  and plaquenil  - rheumatoid arthritis Fiorecet - HA Ambien  - sleep Singulair /Xyzal  - allergy  Debrox - earwax removal Folic acid , vit D - supplement     Type of Medication Issue Identified Description of Issue Recommendation(s)  Drug Interaction(s) (clinically significant)     Duplicate Therapy     Allergy      No Medication Administration End Date     Incorrect Dose     Additional Drug Therapy Needed     Significant med changes from prior encounter (inform family/care partners about these prior to discharge).    Other       Clinically significant medication issues were identified that warrant physician communication and completion of prescribed/recommended actions by midnight of the next day:  No  Name of provider notified for urgent issues identified:   Provider Method of Notification:     Pharmacist comments:   Time spent performing this drug regimen review (minutes):  [address] PharmD, [place], AAHIVP, CPP Infectious Disease Pharmacist 12/13/2024 9:24 AM

## 2024-12-17 ENCOUNTER — Telehealth: Payer: Self-pay | Admitting: Physical Medicine & Rehabilitation

## 2024-12-18 ENCOUNTER — Telehealth: Payer: Self-pay

## 2024-12-18 NOTE — Telephone Encounter (Signed)
 PA FOR Rx Fentanyl  12 mcg patch submitted in Cover my meds

## 2024-12-18 NOTE — Telephone Encounter (Signed)
 Approved today by [facility] 2017 CaseId:[id number];Status:Approved;Review Type:Prior Auth;Coverage Start Date:12/18/2024;Coverage End Date:12/18/2025; Effective Date: 12/18/2024 Authorization Expiration Date: 12/18/2025

## 2024-12-20 ENCOUNTER — Encounter: Admitting: Neurology

## 2025-01-08 ENCOUNTER — Encounter: Admitting: Registered Nurse

## 2025-05-13 ENCOUNTER — Encounter: Admitting: Registered Nurse
